# Patient Record
Sex: Male | Born: 1950 | Race: Black or African American | Hispanic: No | Marital: Married | Smoking: Former smoker
Health system: Southern US, Community
[De-identification: ages and names within clinical notes are randomized; demographics above are authoritative.]

## PROBLEM LIST (undated history)

## (undated) DIAGNOSIS — Z95810 Presence of automatic (implantable) cardiac defibrillator: Secondary | ICD-10-CM

## (undated) DIAGNOSIS — K5730 Diverticulosis of large intestine without perforation or abscess without bleeding: Secondary | ICD-10-CM

## (undated) DIAGNOSIS — I10 Essential (primary) hypertension: Secondary | ICD-10-CM

## (undated) DIAGNOSIS — E785 Hyperlipidemia, unspecified: Secondary | ICD-10-CM

## (undated) DIAGNOSIS — K3184 Gastroparesis: Secondary | ICD-10-CM

## (undated) DIAGNOSIS — N183 Chronic kidney disease, stage 3 unspecified: Secondary | ICD-10-CM

## (undated) DIAGNOSIS — K219 Gastro-esophageal reflux disease without esophagitis: Secondary | ICD-10-CM

## (undated) DIAGNOSIS — E119 Type 2 diabetes mellitus without complications: Secondary | ICD-10-CM

## (undated) DIAGNOSIS — M1990 Unspecified osteoarthritis, unspecified site: Secondary | ICD-10-CM

## (undated) DIAGNOSIS — I509 Heart failure, unspecified: Secondary | ICD-10-CM

## (undated) DIAGNOSIS — J449 Chronic obstructive pulmonary disease, unspecified: Secondary | ICD-10-CM

## (undated) DIAGNOSIS — I499 Cardiac arrhythmia, unspecified: Secondary | ICD-10-CM

## (undated) DIAGNOSIS — I4892 Unspecified atrial flutter: Secondary | ICD-10-CM

## (undated) DIAGNOSIS — D126 Benign neoplasm of colon, unspecified: Secondary | ICD-10-CM

## (undated) HISTORY — DX: Chronic obstructive pulmonary disease, unspecified: J44.9

## (undated) HISTORY — PX: TONSILLECTOMY: SUR1361

## (undated) HISTORY — DX: Unspecified osteoarthritis, unspecified site: M19.90

## (undated) HISTORY — DX: Gastro-esophageal reflux disease without esophagitis: K21.9

## (undated) HISTORY — DX: Diverticulosis of large intestine without perforation or abscess without bleeding: K57.30

## (undated) HISTORY — DX: Hyperlipidemia, unspecified: E78.5

## (undated) HISTORY — DX: Gastroparesis: K31.84

## (undated) HISTORY — PX: CATARACT EXTRACTION W/ INTRAOCULAR LENS  IMPLANT, BILATERAL: SHX1307

## (undated) HISTORY — DX: Benign neoplasm of colon, unspecified: D12.6

---

## 2002-12-22 ENCOUNTER — Emergency Department (HOSPITAL_COMMUNITY): Admission: EM | Admit: 2002-12-22 | Discharge: 2002-12-22 | Payer: Self-pay | Admitting: Emergency Medicine

## 2003-06-15 ENCOUNTER — Ambulatory Visit (HOSPITAL_COMMUNITY): Admission: RE | Admit: 2003-06-15 | Discharge: 2003-06-15 | Payer: Self-pay | Admitting: Pulmonary Disease

## 2004-04-29 ENCOUNTER — Inpatient Hospital Stay (HOSPITAL_COMMUNITY): Admission: EM | Admit: 2004-04-29 | Discharge: 2004-04-30 | Payer: Self-pay | Admitting: Emergency Medicine

## 2004-06-18 ENCOUNTER — Ambulatory Visit (HOSPITAL_COMMUNITY): Admission: RE | Admit: 2004-06-18 | Discharge: 2004-06-18 | Payer: Self-pay | Admitting: Nephrology

## 2004-07-26 ENCOUNTER — Emergency Department (HOSPITAL_COMMUNITY): Admission: EM | Admit: 2004-07-26 | Discharge: 2004-07-26 | Payer: Self-pay | Admitting: Emergency Medicine

## 2005-08-21 ENCOUNTER — Emergency Department (HOSPITAL_COMMUNITY): Admission: EM | Admit: 2005-08-21 | Discharge: 2005-08-21 | Payer: Self-pay | Admitting: Emergency Medicine

## 2006-03-22 ENCOUNTER — Emergency Department (HOSPITAL_COMMUNITY): Admission: EM | Admit: 2006-03-22 | Discharge: 2006-03-22 | Payer: Self-pay | Admitting: Emergency Medicine

## 2007-09-11 ENCOUNTER — Emergency Department (HOSPITAL_COMMUNITY): Admission: EM | Admit: 2007-09-11 | Discharge: 2007-09-11 | Payer: Self-pay | Admitting: Emergency Medicine

## 2008-07-13 ENCOUNTER — Emergency Department (HOSPITAL_COMMUNITY): Admission: EM | Admit: 2008-07-13 | Discharge: 2008-07-13 | Payer: Self-pay | Admitting: Emergency Medicine

## 2009-03-22 ENCOUNTER — Emergency Department (HOSPITAL_COMMUNITY): Admission: EM | Admit: 2009-03-22 | Discharge: 2009-03-22 | Payer: Self-pay | Admitting: Emergency Medicine

## 2009-09-07 ENCOUNTER — Ambulatory Visit (HOSPITAL_COMMUNITY): Admission: RE | Admit: 2009-09-07 | Discharge: 2009-09-07 | Payer: Self-pay | Admitting: Ophthalmology

## 2009-10-09 ENCOUNTER — Ambulatory Visit: Payer: Self-pay | Admitting: Gastroenterology

## 2009-10-09 ENCOUNTER — Encounter: Payer: Self-pay | Admitting: Internal Medicine

## 2009-10-09 DIAGNOSIS — K5900 Constipation, unspecified: Secondary | ICD-10-CM | POA: Insufficient documentation

## 2009-10-09 DIAGNOSIS — K219 Gastro-esophageal reflux disease without esophagitis: Secondary | ICD-10-CM | POA: Insufficient documentation

## 2009-10-09 DIAGNOSIS — R1013 Epigastric pain: Secondary | ICD-10-CM | POA: Insufficient documentation

## 2009-10-30 ENCOUNTER — Ambulatory Visit (HOSPITAL_COMMUNITY): Admission: RE | Admit: 2009-10-30 | Discharge: 2009-10-30 | Payer: Self-pay | Admitting: Internal Medicine

## 2009-10-30 ENCOUNTER — Ambulatory Visit: Payer: Self-pay | Admitting: Internal Medicine

## 2009-10-30 DIAGNOSIS — D126 Benign neoplasm of colon, unspecified: Secondary | ICD-10-CM

## 2009-10-30 HISTORY — PX: COLONOSCOPY: SHX174

## 2009-10-30 HISTORY — DX: Benign neoplasm of colon, unspecified: D12.6

## 2009-10-30 HISTORY — PX: ESOPHAGOGASTRODUODENOSCOPY: SHX1529

## 2009-11-01 ENCOUNTER — Encounter: Payer: Self-pay | Admitting: Internal Medicine

## 2009-11-01 ENCOUNTER — Encounter (HOSPITAL_COMMUNITY): Admission: RE | Admit: 2009-11-01 | Discharge: 2009-12-01 | Payer: Self-pay | Admitting: Internal Medicine

## 2009-11-02 ENCOUNTER — Encounter: Payer: Self-pay | Admitting: Internal Medicine

## 2009-11-10 ENCOUNTER — Telehealth (INDEPENDENT_AMBULATORY_CARE_PROVIDER_SITE_OTHER): Payer: Self-pay

## 2009-12-13 ENCOUNTER — Telehealth (INDEPENDENT_AMBULATORY_CARE_PROVIDER_SITE_OTHER): Payer: Self-pay

## 2010-02-27 ENCOUNTER — Telehealth (INDEPENDENT_AMBULATORY_CARE_PROVIDER_SITE_OTHER): Payer: Self-pay

## 2010-04-03 NOTE — Progress Notes (Signed)
Summary: phone note/ need Domperidone Rx faxed to [facility]  Phone Note Other Incoming   Caller: Frank Hoover/ Oncologist of Call: Reyne Dumas called and said he has called [facility] to set up an acct for pt to have the Domperidone. Needs someone from  office to fax Rx to [facility] @ [phone] and include customer ID [phone]. Initial call taken by: Waldon Merl LPN,  October 12, 624THL 9:06 AM     Appended Document: phone note/ need Domperidone Rx faxed to [facility] RMR left written Rx at [facility] short stay. Went to short stay and picked up Rx and faxed to [facility].   Appended Document: phone note/ need Domperidone Rx faxed to [facility] prescription left at short stay as per plan on 12/14/09

## 2010-04-03 NOTE — Letter (Signed)
Summary: Patient Notice, Colon Biopsy Results  [facility]  [address]   [place], [place] [address]   Phone: [phone]  Fax: [phone]       November 01, 2009   Frank Hoover [address] [place], [place]  [address] June 14, 1950    Dear Mr. [name],  I am pleased to inform you that the biopsies taken during your recent colonoscopy did not show any evidence of cancer upon pathologic examination.  Additional information/recommendations:  Continue with the treatment plan as outlined on the day of your exam.  You should have a repeat colonoscopy examination  in 3 years.  Please call us if you are having persistent problems or have questions about your condition that have not been fully answered at this time.  Sincerely,    R. Garfield Cornea MD, [facility] Ph: [phone]    Fax: [phone]   Appended Document: Patient Notice, Colon Biopsy Results letter mailed to pt  Appended Document: Patient Notice, Colon Biopsy Results reminder in computer

## 2010-04-03 NOTE — Progress Notes (Signed)
----   Converted from flag ---- ---- 11/10/2009 12:18 PM, R. Garfield Cornea MD, Quentin Ore wrote: may take domperiedone up to 4x daily (max) before 3 meals and at bedtime or before meals only - up to 4x daily  ---- 11/10/2009 12:02 PM, Waldon Merl LPN wrote: Dr. Gala Romney, is pt just to take Domperidone before one meal and at bedtime daily? Please confirm so i can call in to [facility]. Thx ------------------------------  Appended Document:  LATE ENTRY: Called to [place] on 11/10/2009.

## 2010-04-03 NOTE — Assessment & Plan Note (Signed)
Summary: ABD PAIN/SS   Visit Type:  Consult Referring Provider:  Velvet Bathe Primary Care Provider:  Velvet Bathe  Chief Complaint:  abd pain.  History of Present Illness: Frank Hoover is a pleasant 60 yo AA male, patient of Dr. Luan Pulling, who presents for further evaluation of abd pain. Symptoms began when he ran out of his omeprazole for several days. Less frequent abd pain since back on medications. No heartburn on PPI. On med for several years. On Zegerid first, but Medicare wouldn't cover. No dysphagia. BM slow, every other day. No melena, brpbpr. No prior TCS. No n/v. No significant weight change.  Current Medications (verified): 1)  Allopurinol 300 Mg Tabs (Allopurinol) .... 1/2 Once Daily 2)  Gabapentin 400 Mg Caps (Gabapentin) .... Qid 3)  Colcrys 0.6 Mg Tabs (Colchicine) .... Once Daily 4)  Zolpidem Tartrate 10 Mg Tabs (Zolpidem Tartrate) .... At Bedtime 5)  Clonidine Hcl 0.2 Mg Tabs (Clonidine Hcl) .... Two Times A Day 6)  Furosemide 20 Mg Tabs (Furosemide) .... 2 Once Daily 7)  Proair Hfa 108 (90 Base) Mcg/act Aers (Albuterol Sulfate) .... Qid 8)  Omeprazole 20 Mg Cpdr (Omeprazole) .... Once Daily 9)  Norco 5-325 Mg Tabs (Hydrocodone-Acetaminophen) .... Qid As Needed 10)  Amlodipine Besy-Benazepril Hcl 5-10 Mg Caps (Amlodipine Besy-Benazepril Hcl) .... Once Daily 11)  Simvastatin 40 Mg Tabs (Simvastatin) .... Once Daily 12)  Glimepiride 2 Mg Tabs (Glimepiride) .... Once Daily 13)  Humulin R 100 Unit/ml Soln (Insulin Regular Human) .... Ss 14)  Humulin 70/30 70-30 % Susp (Insulin Isophane & Regular) .... 7 Units Daily  Allergies (verified): No Known Drug Allergies  Past History:  Past Medical History: Gout CRF COPD Diabetes GERD Hyperlipidemia Hypertension OA  Past Surgical History: Tonsillectomy Cataract extraction, right  Family History: Mother, brother, DM, HTN Father, emphysema No FH CRC, liver disease.  Social History: Married. 2 children. Quit tob, 25  years ago. No alcohol now, never heavy.   Review of Systems General:  Denies fever, chills, sweats, anorexia, fatigue, weakness, and weight loss. Eyes:  Denies vision loss. ENT:  Denies nasal congestion, sore throat, hoarseness, and difficulty swallowing. CV:  Complains of peripheral edema; denies chest pains, angina, palpitations, and dyspnea on exertion. Resp:  Denies dyspnea at rest, dyspnea with exercise, cough, sputum, and wheezing. GI:  See HPI. GU:  Denies urinary burning and blood in urine. MS:  Complains of joint pain / LOM. Derm:  Denies rash and itching. Neuro:  Denies weakness, frequent headaches, memory loss, and confusion. Psych:  Denies depression and anxiety. Endo:  Denies unusual weight change. Heme:  Denies bruising and bleeding. Allergy:  Denies hives and rash.  Vital Signs:  Patient profile:   60 year old male Height:      66 inches Weight:      266 pounds BMI:     43.09 Temp:     98.3 degrees F oral Pulse rate:   60 / minute BP sitting:   112 / 78  (left arm) Cuff size:   large  Vitals Entered By: Burnadette Peter LPN (August  8, 624THL 9:36 AM)  Physical Exam  General:  Well developed, well nourished, no acute distress.obese.   Head:  Normocephalic and atraumatic. Eyes:  sclera nonicteric Mouth:  Oropharyngeal mucosa moist, pink.  No lesions, erythema or exudate.   dentures:.   Neck:  Supple; no masses or thyromegaly. Lungs:  Clear throughout to auscultation. Heart:  Regular rate and rhythm; no murmurs, rubs,  or bruits.  Abdomen:  Normal BS. Obese. No HSM or masses appreciated but limited due to body habitus. No fluid wave or shifting dullness to percussion. No abd bruit or hernia.  Rectal:  deferred until time of colonoscopy.   Extremities:  trace pedal edema.   Neurologic:  Alert and  oriented x4;  grossly normal neurologically. Skin:  Intact without significant lesions or rashes. Cervical Nodes:  No significant cervical adenopathy. Psych:  Alert and  cooperative. Normal mood and affect.  Impression & Recommendations:  Problem # 1:  ABDOMINAL PAIN-EPIGASTRIC (ICD-789.06)  Recent epigastric pain off PPI. Symptoms are better. He has chronic GERD, heartburn controlled. EGD to be performed in near future.  Risks, alternatives, benefits including but not limited to risk of reaction to medications, bleeding, infection, and perforation addressed.  Patient voiced understanding and verbal consent obtained. To further evaluate pain and r/o complicated GERD.  Retrieve last labs from Dr. Luan Pulling.  Orders: Consultation Level IV [id number])  Problem # 2:  CONSTIPATION (ICD-564.00)  Start Dulcolax Balance, samples provided. Then will begin generic.   No prior TCS. Colonoscopy to be performed in near future.  Risks, alternatives, and benefits including but not limited to the risk of reaction to medication, bleeding, infection, and perforation were addressed.  Patient voiced understanding and provided verbal consent.   Orders: Consultation Level IV [id number])  Patient Instructions: 1)  Colonoscopy and EGD as scheduled. 2)  Please start Dulcolax Balance one capful daily for next 7 days. Then take prescription given for the generic. 3)  We will obtain your last labs from Dr. Luan Pulling for our review. 4)  The medication list was reviewed and reconciled.  All changed / newly prescribed medications were explained.  A complete medication list was provided to the patient / caregiver. Prescriptions: POLYETHYLENE GLYCOL 3350  POWD (POLYETHYLENE GLYCOL 3350) 17 grams by mouth daily as needed constipation  #527 g x 5   Entered and Authorized by:   Laureen Ochs. Bernarda Caffey   Signed by:   Laureen Ochs Bence Trapp PA-C on 10/09/2009   Method used:   Print then Give to Patient   RxID:   [id number]  I would like to thank Dr. Luan Pulling for allowing [place] to take part in the care of this nice patient.   Appended Document: ABD PAIN/SS Labs from 6/11: HgbA1C 6.4

## 2010-04-03 NOTE — Letter (Signed)
Summary: Patient Notice, Colon Biopsy Results  [facility]  [address]   [place], [place] [address]   Phone: [phone]  Fax: [phone]       November 02, 2009   Frank Hoover [address] [place], [place]  [address] [birth year]    Dear [name],  I am pleased to inform you that the biopsies taken during your recent colonoscopy did not show any evidence of cancer upon pathologic examination.  Additional information/recommendations:  You should have a repeat colonoscopy examination  in 3 years.  Please call us if you are having persistent problems or have questions about your condition that have not been fully answered at this time.  Sincerely,    R. Garfield Cornea MD, [facility] Ph: [phone]    Fax: [phone]   Appended Document: Patient Notice, Colon Biopsy Results duplicate letter. First letter already mailed

## 2010-04-03 NOTE — Letter (Signed)
Summary: TCS/EGD Order   TCS/EGD Order   Imported By: Sherry Ruffing 10/09/2009 11:00:37  _____________________________________________________________________  External Attachment:    Type:   Image     Comment:   External Document

## 2010-04-05 NOTE — Progress Notes (Signed)
Summary: phone note/ ref to Tylenol for as needed pain  Phone Note Other Incoming   Caller: Arnette Red/ Nurse  Summary of Call: Anne Ng called and said she has been getting Tylenol for as needed pain. (Pt takes Norco 5/325/ mg). But  the pharmacist said now she should check with the doctor before she does this. Please advise! ( Her call back number is [phone]. Initial call taken by: Waldon Merl LPN,  December 27, 624THL 4:22 PM     Appended Document: phone note/ ref to Tylenol for as needed pain up to 2 grams of acetaminophen daily (from all sources) [place], even in cirrhotics; if  2 grams not adequate, then need to see pcp for other Rx options   Appended Document: phone note/ ref to Tylenol for as needed pain Informed nurse, Arnette and also Jenny Reichmann, Software engineer at [employer].

## 2010-05-15 ENCOUNTER — Encounter: Payer: Self-pay | Admitting: Internal Medicine

## 2010-05-17 LAB — GLUCOSE, CAPILLARY: Glucose-Capillary: 192 mg/dL — ABNORMAL HIGH (ref 70–99)

## 2010-05-20 LAB — CBC
HCT: 35.4 % — ABNORMAL LOW (ref 39.0–52.0)
Hemoglobin: 12.4 g/dL — ABNORMAL LOW (ref 13.0–17.0)
MCHC: 35.2 g/dL (ref 30.0–36.0)
MCV: 89.7 fL (ref 78.0–100.0)
Platelets: 243 10*3/uL (ref 150–400)
RBC: 3.94 MIL/uL — ABNORMAL LOW (ref 4.22–5.81)
RDW: 14.9 % (ref 11.5–15.5)
WBC: 6 10*3/uL (ref 4.0–10.5)

## 2010-05-20 LAB — BASIC METABOLIC PANEL
BUN: 58 mg/dL — ABNORMAL HIGH (ref 6–23)
CO2: 25 mEq/L (ref 19–32)
Calcium: 9.1 mg/dL (ref 8.4–10.5)
Chloride: 96 mEq/L (ref 96–112)
Creatinine, Ser: 2.43 mg/dL — ABNORMAL HIGH (ref 0.4–1.5)
GFR calc Af Amer: 33 mL/min — ABNORMAL LOW (ref >60)
GFR calc non Af Amer: 28 mL/min — ABNORMAL LOW (ref >60)
Glucose, Bld: 225 mg/dL — ABNORMAL HIGH (ref 70–99)
Potassium: 3.9 mEq/L (ref 3.5–5.1)
Sodium: 132 mEq/L — ABNORMAL LOW (ref 135–145)

## 2010-05-20 LAB — COMPREHENSIVE METABOLIC PANEL
ALT: 19 U/L (ref 0–53)
AST: 18 U/L (ref 0–37)
Albumin: 3.9 g/dL (ref 3.5–5.2)
Alkaline Phosphatase: 49 U/L (ref 39–117)
BUN: 18 mg/dL (ref 6–23)
CO2: 27 mEq/L (ref 19–32)
Calcium: 9.6 mg/dL (ref 8.4–10.5)
Chloride: 108 mEq/L (ref 96–112)
Creatinine, Ser: 1.44 mg/dL (ref 0.4–1.5)
GFR calc Af Amer: >60 mL/min (ref >60)
GFR calc non Af Amer: 51 mL/min — ABNORMAL LOW (ref >60)
Glucose, Bld: 59 mg/dL — ABNORMAL LOW (ref 70–99)
Potassium: 4.8 mEq/L (ref 3.5–5.1)
Sodium: 140 mEq/L (ref 135–145)
Total Bilirubin: 0.5 mg/dL (ref 0.3–1.2)
Total Protein: 6.9 g/dL (ref 6.0–8.3)

## 2010-05-20 LAB — GLUCOSE, CAPILLARY: Glucose-Capillary: 171 mg/dL — ABNORMAL HIGH (ref 70–99)

## 2010-05-20 LAB — HEMOGLOBIN AND HEMATOCRIT, BLOOD
HCT: 34.8 % — ABNORMAL LOW (ref 39.0–52.0)
Hemoglobin: 12.4 g/dL — ABNORMAL LOW (ref 13.0–17.0)

## 2010-05-20 LAB — DIFFERENTIAL
Basophils Absolute: 0 10*3/uL (ref 0.0–0.1)
Basophils Relative: 0 % (ref 0–1)
Eosinophils Absolute: 0.2 10*3/uL (ref 0.0–0.7)
Eosinophils Relative: 3 % (ref 0–5)
Lymphocytes Relative: 30 % (ref 12–46)
Lymphs Abs: 1.8 10*3/uL (ref 0.7–4.0)
Monocytes Absolute: 0.4 10*3/uL (ref 0.1–1.0)
Monocytes Relative: 7 % (ref 3–12)
Neutro Abs: 3.6 10*3/uL (ref 1.7–7.7)
Neutrophils Relative %: 60 % (ref 43–77)

## 2010-05-20 LAB — LIPASE, BLOOD: Lipase: 18 U/L (ref 11–59)

## 2010-05-20 LAB — POCT CARDIAC MARKERS
CKMB, poc: 1.9 ng/mL (ref 1.0–8.0)
Myoglobin, poc: 154 ng/mL (ref 12–200)
Troponin i, poc: <0.05 ng/mL (ref 0.00–0.09)

## 2010-05-22 NOTE — Medication Information (Signed)
Summary: DONPERIDON   DONPERIDON   Imported By: Hoy Morn 05/15/2010 14:22:47  _____________________________________________________________________  External Attachment:    Type:   Image     Comment:   External Document

## 2010-06-12 LAB — URINALYSIS, ROUTINE W REFLEX MICROSCOPIC
Bilirubin Urine: NEGATIVE
Glucose, UA: NEGATIVE mg/dL
Ketones, ur: NEGATIVE mg/dL
Leukocytes, UA: NEGATIVE
Nitrite: NEGATIVE
Protein, ur: 100 mg/dL — AB
Specific Gravity, Urine: 1.01 (ref 1.005–1.030)
Urobilinogen, UA: 0.2 mg/dL (ref 0.0–1.0)
pH: 6 (ref 5.0–8.0)

## 2010-06-12 LAB — URINE MICROSCOPIC-ADD ON

## 2010-07-20 NOTE — Discharge Summary (Signed)
NAME:  Frank Hoover, Frank Hoover NO.:  [id number]   MEDICAL RECORD NO.:  [id number]          PATIENT TYPE:  INP   LOCATION:  A315                          FACILITY:  [facility]   PHYSICIAN:  Edward L. Luan Pulling, M.D.DATE OF BIRTH:  January 04, 1951   DATE OF ADMISSION:  04/29/2004  DATE OF DISCHARGE:  02/27/2006LH                                 DISCHARGE SUMMARY   FINAL DISCHARGE DIAGNOSES:  1.  Diabetes with hypoglycemia.  2.  Hypertension.  3.  Acute renal failure.  4.  Osteoarthritis.  5.  Hyperlipidemia.  6.  Hypokalemia.  7.  Hyponatremia.   HISTORY:  Frank Hoover is a 60 year old with long known history of severe  hypertension and insulin-dependent diabetes.  He had been in his usual state  of fair health at home when he woke up with EMS with him, he was found to  have low blood sugar, he received treatment and has improved.  When he was  seen in the emergency room however, he had what appeared to be renal failure  which is a new finding.   PHYSICAL EXAMINATION:  His physical exam showed blood pressure is 145/83,  pulse 81, respirations 16, temperature 97.6.  His pupils are reactive, nose  and throat clear, CNS is grossly intact after he got his sugar up.  His  potassium was 3.3, sodium 130.  His BUN was 72, creatinine 2.9.   HOSPITAL COURSE:  He was admitted with a diagnosis of hypoglycemia and renal  failure.  He has not had renal failure in the past.  He was started on  intravenous fluids, indomethacin which he had been taking intermittently for  gout was withheld, he had a renal ultrasound which was normal and his renal  function returned much improved with BUN of 43, creatinine of 1.9 by the  time of discharge.  He is discharged home in improved condition to continue  with his previous medications, to be followed closely and come to my office  in about a week for another BMET.      ELH/MEDQ  D:  04/30/2004  T:  04/30/2004  Job:  [id number]

## 2010-07-20 NOTE — Procedures (Signed)
NAME:  Frank Hoover, Frank Hoover NO.:  [id number]   MEDICAL RECORD NO.:  [id number]          PATIENT TYPE:  EMS   LOCATION:  ED                            FACILITY:  [facility]   PHYSICIAN:  Edward L. Luan Pulling, M.D.DATE OF BIRTH:  1950/05/15   DATE OF PROCEDURE:  08/21/2005  DATE OF DISCHARGE:  08/21/2005                                EKG INTERPRETATION   The rhythm is sinus tachycardia with a rate of about 130.  The computers  called it a PAC but I think it is artifactual.  There is questionable left  atrial enlargement.  Slow R-wave progression across the precordium may  indicate a previous anterior infarction.  There are T-wave abnormalities  which are nonspecific.      Edward L. Luan Pulling, M.D.  Electronically Signed     ELH/MEDQ  D:  08/21/2005  T:  08/21/2005  Job:  [id number]

## 2010-07-20 NOTE — H&P (Signed)
NAME:  Frank Hoover, Frank Hoover NO.:  [id number]   MEDICAL RECORD NO.:  [id number]          PATIENT TYPE:  INP   LOCATION:  A315                          FACILITY:  [facility]   PHYSICIAN:  Edward L. Luan Pulling, M.D.DATE OF BIRTH:  [birth year]   DATE OF ADMISSION:  04/29/2004  DATE OF DISCHARGE:  LH                                HISTORY & PHYSICAL   REASON FOR ADMISSION:  Hypoglycemia.   HISTORY:  Mr. Hocevar is a 60 year old with a long history of severe  hypertension and insulin-dependent diabetes.  He was in his usual state of  pretty fair health at home when he says that he woke up with EMS with him,  and he was found to have a low blood sugar.  He says his blood sugar was 60,  but I believe that was in the emergency room after receiving treatment on  the way to the hospital.  He has not had any nausea, vomiting, diarrhea.  He  has had a cold, but he has not had any other problems that might have caused  him to have difficulty with his blood sugar.  He was given D50W on the  scene.   PAST MEDICAL HISTORY:  1.  Osteoarthritis.  2.  Insulin-dependent diabetes mellitus.  3.  Hypertension.  4.  Hyperlipidemia.   SOCIAL HISTORY:  He lives at home with family.  He does not use drugs,  alcohol, or tobacco.   FAMILY HISTORY:  Very positive for diabetes in multiple family members, and  there is a history of coronary disease as well.   MEDICATIONS AT HOME:  1.  Lotrel 10/20 daily.  2.  Benicar 40 mg daily.  3.  Indomethacin 50 mg four times a day as needed.  4.  Amaryl 2 mg daily.  5.  Lasix 20 mg daily p.r.n.  6.  HCTZ 25 mg daily.  7.  Lipitor 40 mg daily.  8.  Nadolol 40 mg b.i.d.  9.  Temazepam 15 mg for sleep.  10. He uses 70/30 insulin, the dose of that is not quite clear.   PHYSICAL EXAMINATION ON ADMISSION:  GENERAL:  Awake and alert, complaining  of being cold.  VITAL SIGNS:  Blood pressure 145/83, pulse 81, respirations 16, temperature  97.6.  HEENT:  Pupils  equal, round, and reactive to light and accommodation.  Mucous membranes slightly dry.  He has multiple missing teeth.  His nose and  throat are clear.  CHEST:  Clear without wheezes, rales, or rhonchi.  HEART:  Regular without murmur, gallop, or rub.  ABDOMEN:  Soft.  No masses felt.  EXTREMITIES:  No edema.  CNS:  Grossly intact.   LABORATORY DATA AND OTHER STUDIES:  Liver function normal.  BMET showed BUN  72, creatinine 2.9, potassium 3.3, sodium 130.  CBC shows white count 7700,  hemoglobin 12.4.   ASSESSMENT:  He has hypoglycemia.  He had also elevated BUN and creatinine,  suggestive at least that he might have had an increase in renal dysfunction.  Some of this may be related to his use of indomethacin which he  uses p.r.n.  for gout.  He is going to have an ultrasound of the kidneys.  We will  continue his other medications and follow.      ELH/MEDQ  D:  04/29/2004  T:  04/29/2004  Job:  [id number]

## 2010-07-20 NOTE — Group Therapy Note (Signed)
NAME:  KHALFANI, WEIDEMAN NO.:  [id number]   MEDICAL RECORD NO.:  [id number]          PATIENT TYPE:  INP   LOCATION:  A315                          FACILITY:  [facility]   PHYSICIAN:  Edward L. Luan Pulling, M.D.DATE OF BIRTH:  June 19, 1950   DATE OF PROCEDURE:  04/30/2004  DATE OF DISCHARGE:                                   PROGRESS NOTE   SUBJECTIVE:  Mr. Hinnant was admitted with hypoglycemia.  He is much better.  He also was noted to have an abnormal renal function.  His blood sugars have  been much higher now so he has no further episodes of hypoglycemia.   PHYSICAL EXAMINATION:  VITAL SIGNS:  Blood pressure 111/58, temperature  97.8, pulse 69, respirations 20, blood sugar 176.   ASSESSMENT:  He is down for renal ultrasound.  His renal function is much  improved today so I am hopeful that he will be able to be discharged later  today.      ELH/MEDQ  D:  04/30/2004  T:  04/30/2004  Job:  [id number]

## 2010-09-21 ENCOUNTER — Ambulatory Visit (INDEPENDENT_AMBULATORY_CARE_PROVIDER_SITE_OTHER): Payer: PRIVATE HEALTH INSURANCE | Admitting: Urgent Care

## 2010-09-21 ENCOUNTER — Telehealth: Payer: Self-pay

## 2010-09-21 ENCOUNTER — Encounter: Payer: Self-pay | Admitting: Urgent Care

## 2010-09-21 DIAGNOSIS — K219 Gastro-esophageal reflux disease without esophagitis: Secondary | ICD-10-CM

## 2010-09-21 DIAGNOSIS — Z860101 Personal history of adenomatous and serrated colon polyps: Secondary | ICD-10-CM

## 2010-09-21 DIAGNOSIS — Z86010 Personal history of colonic polyps: Secondary | ICD-10-CM | POA: Insufficient documentation

## 2010-09-21 DIAGNOSIS — K3184 Gastroparesis: Secondary | ICD-10-CM | POA: Insufficient documentation

## 2010-09-21 DIAGNOSIS — E669 Obesity, unspecified: Secondary | ICD-10-CM

## 2010-09-21 DIAGNOSIS — R1013 Epigastric pain: Secondary | ICD-10-CM

## 2010-09-21 DIAGNOSIS — K5900 Constipation, unspecified: Secondary | ICD-10-CM

## 2010-09-21 NOTE — Assessment & Plan Note (Signed)
Well controlled on prilosec 20mg  daily.

## 2010-09-21 NOTE — Assessment & Plan Note (Signed)
Increase domperidone 10mg  to QID as originally ordered by Dr Gala Romney.

## 2010-09-21 NOTE — Assessment & Plan Note (Signed)
Low fat low cholesterol diet Wt loss 1-2# per week Gradually increase exercise up to 30 minutes daily

## 2010-09-21 NOTE — Telephone Encounter (Signed)
Pt was in office today and his bottle of Domperidone said one tablet before a meal and at bedtime. Per Vickey Huger, NP I called and spoke to [place] at [facility]. His note said bid, but according to our documentaion in epic on 11/13/2010, it was confirmed up to 4 tablets daily. Not over 4 max. So I informed Lanny Hurst that he is to be taking one before meals and at bedtime. He made notation, and said he will fax over request for an updated prescription.

## 2010-09-21 NOTE — Assessment & Plan Note (Signed)
At baseline.  Miralax prn.

## 2010-09-21 NOTE — Progress Notes (Signed)
Referring Provider: Alonza Bogus, MD Primary Care Physician:  Alonza Bogus, MD Primary Gastroenterologist:  Dr. Gala Romney  Chief Complaint  Patient presents with  . Abdominal Pain    HPI:  Frank Hoover is a 60 y.o. male here for follow up for abd pain, gastroparesis & constipation.  He is doing well.  Is taking domperidone 10mg  bid instead of QID as directed.  Denies nausea, vomiting or abd pain.  Has had some constipation but generally has a BM couple times per week.  Denies rectal bleeding or melena.  Past Medical History  Diagnosis Date  . Gout   . CRF (chronic renal failure)   . COPD (chronic obstructive pulmonary disease)   . DM (diabetes mellitus)   . GERD (gastroesophageal reflux disease)   . HTN (hypertension)   . Hyperlipemia   . OA (osteoarthritis)   . Gastroparesis 11/01/09  . Adenomatous colon polyp 10/30/09    serrated adenoma removed during Colonoscopy   . Diverticulosis of colon    Past Surgical History  Procedure Date  . Tonsillectomy   . Cataract extraction     right   Current Outpatient Prescriptions  Medication Sig Dispense Refill  . albuterol (PROAIR HFA) 108 (90 BASE) MCG/ACT inhaler Inhale 2 puffs into the lungs every 6 (six) hours as needed.        Marland Kitchen allopurinol (ZYLOPRIM) 300 MG tablet Take 300 mg by mouth daily.        Marland Kitchen amLODipine-benazepril (LOTREL) 5-10 MG per capsule Take 1 capsule by mouth daily.        . cloNIDine (CATAPRES) 0.2 MG tablet Take 0.2 mg by mouth 2 (two) times daily.        . colchicine 0.6 MG tablet Take 0.6 mg by mouth daily.        . furosemide (LASIX) 20 MG tablet Take 20 mg by mouth 2 (two) times daily.        Marland Kitchen gabapentin (NEURONTIN) 400 MG capsule Take 400 mg by mouth 3 (three) times daily.        Marland Kitchen glimepiride (AMARYL) 2 MG tablet Take 2 mg by mouth daily before breakfast.        . HYDROcodone-acetaminophen (NORCO) 5-325 MG per tablet Take 1 tablet by mouth every 6 (six) hours as needed.        . insulin NPH-insulin  regular (NOVOLIN 70/30) (70-30) 100 UNIT/ML injection Inject 7 Units into the skin.        Marland Kitchen insulin regular (HUMULIN R,NOVOLIN R) 100 units/mL injection Inject into the skin 3 (three) times daily before meals.        Marland Kitchen omeprazole (PRILOSEC) 20 MG capsule Take 20 mg by mouth daily.        . polyethylene glycol (MIRALAX / GLYCOLAX) packet Take 17 g by mouth daily.        . simvastatin (ZOCOR) 40 MG tablet Take 40 mg by mouth at bedtime.        Marland Kitchen UNABLE TO FIND Domperidone 10 mg Bid       . zolpidem (AMBIEN) 10 MG tablet Take 10 mg by mouth at bedtime as needed.         Allergies as of 09/21/2010  . (No Known Allergies)   Review of Systems: Gen: Denies any fever, chills, sweats, anorexia, fatigue, weakness, malaise, weight loss, and sleep disorder CV: Denies chest pain, angina, palpitations, syncope, orthopnea, PND, peripheral edema, and claudication. Resp: Denies dyspnea at rest, dyspnea with exercise, cough, sputum,  wheezing, coughing up blood, and pleurisy. GI: Denies vomiting blood, jaundice, and fecal incontinence.   Denies dysphagia or odynophagia. Derm: Denies rash, itching, dry skin, hives, moles, warts, or unhealing ulcers.  Psych: Denies depression, anxiety, memory loss, suicidal ideation, hallucinations, paranoia, and confusion. Heme: Denies bruising, bleeding, and enlarged lymph nodes.  Physical Exam: BP 137/82  Pulse 81  Temp(Src) 98 F (36.7 C) (Temporal)  Ht 5\' 6"  (1.676 m)  Wt 267 lb (121.11 kg)  BMI 43.09 kg/m2 General:   Alert,  Well-developed, obese, pleasant and cooperative in NAD Head:  Normocephalic and atraumatic. Eyes:  Sclera clear, no icterus.   Conjunctiva pink. Mouth:  No deformity or lesions, dentition normal. Neck:  Supple; no masses or thyromegaly. Heart:  Regular rate and rhythm; no murmurs, clicks, rubs,  or gallops. Abdomen:  Soft, obese, nontender and nondistended. No masses, hepatosplenomegaly or hernias noted. Normal bowel sounds, without guarding,  and without rebound.  Exam limited given pt's body habitus. Msk:  Symmetrical without gross deformities. Normal posture. Pulses:  Normal pulses noted. Extremities:  Without clubbing or edema. Neurologic:  Alert and  oriented x4;  grossly normal neurologically. Skin:  Intact without significant lesions or rashes. Cervical Nodes:  No significant cervical adenopathy. Psych:  Alert and cooperative. Normal mood and affect.

## 2010-09-21 NOTE — Patient Instructions (Addendum)
Domperidone 10mg  before breakfast, lunch, dinner & at bedtime  Constipation in Adults Constipation is having fewer than 2 bowel movements per week. Usually, the stools are hard. As we grow older, constipation is more common. If you try to fix constipation with laxatives, the problem may get worse. This is because laxatives taken over a long period of time make the colon muscles weaker. A low-fiber diet, not taking in enough fluids, and taking some medicines may make these problems worse. MEDICATIONS THAT MAY CAUSE CONSTIPATION  Water pills (diuretics).  Calcium channel blockers (used to control blood pressure and for the heart).   Certain pain medicines (narcotics).   Anticholinergics.  Anti-inflammatory agents.   Antacids that contain aluminum.   DISEASES THAT CONTRIBUTE TO CONSTIPATION  Diabetes.  Parkinson's disease.   Dementia.   Stroke.  Depression.   Illnesses that cause problems with salt and water metabolism.   HOME CARE INSTRUCTIONS  Constipation is usually best cared for without medicines. Increasing dietary fiber and eating more fruits and vegetables is the best way to manage constipation.   Slowly increase fiber intake to 25 to 38 grams per day. Whole grains, fruits, vegetables, and legumes are good sources of fiber. A dietitian can further help you incorporate high-fiber foods into your diet.   Drink enough water and fluids to keep your urine clear or pale yellow.   A fiber supplement may be added to your diet if you cannot get enough fiber from foods.   Increasing your activities also helps improve regularity.   Suppositories, as suggested by your caregiver, will also help. If you are using antacids, such as aluminum or calcium containing products, it will be helpful to switch to products containing magnesium if your caregiver says it is okay.   If you have been given a liquid injection (enema) today, this is only a temporary measure. It should not be relied on  for treatment of longstanding (chronic) constipation.   Stronger measures, such as magnesium sulfate, should be avoided if possible. This may cause uncontrollable diarrhea. Using magnesium sulfate may not allow you time to make it to the bathroom.  SEEK IMMEDIATE MEDICAL CARE IF:  There is bright red blood in the stool.   The constipation stays for more than 4 days.   There is belly (abdominal) or rectal pain.   You do not seem to be getting better.   You have any questions or concerns.  MAKE SURE YOU:  Understand these instructions.   Will watch your condition.   Will get help right away if you are not doing well or get worse.  Document Released: 11/17/2003 Document Re-Released: 05/15/2009 [facility] Patient Information 2011 [place].Low-Fat, Low-Saturated-Fat, Low-Cholesterol Diets Food Selection Guide BREADS, CEREALS, PASTA, RICE, DRIED PEAS, AND BEANS These products are high in carbohydrates and most are low in fat. Therefore, they can be increased in the diet as substitutes for fatty foods. They too, however, contain calories and should not be eaten in excess. Cereals can be eaten for snacks as well as for breakfast.  Include foods that contain fiber (fruits, vegetables, whole grains, and legumes). Research shows that fiber may lower blood cholesterol levels, especially the water-soluble fiber found in fruits, vegetables, oat products, and legumes. FRUITS AND VEGETABLES It is good to eat fruits and vegetables. Besides being sources of fiber, both are rich in vitamins and some minerals. They help you get the daily allowances of these nutrients. Fruits and vegetables can be used for snacks and desserts. MEATS  Limit lean meat, chicken, [place], and fish to no more than 6 ounces per day. Beef, Pork, and Lamb  Use lean cuts of beef, pork, and lamb. Lean cuts include:   Extra-lean ground beef.   Arm roast.   Sirloin tip.   Center-cut ham.   Round steak.   Loin chops.     Rump roast.   Tenderloin.  Trim all fat off the outside of meats before cooking. It is not necessary to severely decrease the intake of red meat, but lean choices should be made. Lean meat is rich in protein and contains a highly absorbable form of iron. Premenopausal women, in particular, should avoid reducing lean red meat because this could increase the risk for low red blood cells (iron-deficiency anemia). Processed Meats Processed meats, such as bacon, bologna, salami, sausage, and hot dogs contain large quantities of fat, are not rich in valuable nutrients, and should not be eaten very often. Organ Meats The organ meats, such as liver, sweetbreads, kidneys, and brain are very rich in cholesterol. They should be limited. Chicken and [place] These are good sources of protein. The fat of poultry can be reduced by removing the skin and underlying fat layers before cooking. Chicken and [place] can be substituted for lean red meat in the diet. Poultry should not be fried or covered with high-fat sauces. Fish and Shellfish Fish is a good source of protein. Shellfish contain cholesterol, but they usually are low in saturated fatty acids. The preparation of fish is important. Like chicken and [place], they should not be fried or covered with high-fat sauces. EGGS Egg yolks often are hidden in cooked and processed foods. Egg whites contain no fat or cholesterol. They can be eaten often. Try 1 to 2 egg whites instead of whole eggs in recipes or use egg substitutes that do not contain yolk. MILK AND DAIRY PRODUCTS Use skim or 1% milk instead of 2% or whole milk. Decrease whole milk, natural, and processed cheeses. Use nonfat or low-fat (2%) cottage cheese or low-fat cheeses made from vegetable oils. Choose nonfat or low-fat (1 to 2%) yogurt. Experiment with evaporated skim milk in recipes that call for heavy cream. Substitute low-fat yogurt or low-fat cottage cheese for sour cream in dips and salad  dressings. Have at least 2 servings of low-fat dairy products, such as 2 glasses of skim (or 1%) milk each day to help get your daily calcium intake. FATS AND OILS Reduce the total intake of fats, especially saturated fat. Butterfat, lard, and beef fats are high in saturated fat and cholesterol. These should be avoided as much as possible. Vegetable fats do not contain cholesterol, but certain vegetable fats, such as coconut oil, palm oil, and palm kernel oil are very high in saturated fats. These should be limited. These fats are often used in bakery goods, processed foods, popcorn, oils, and nondairy creamers. Vegetable shortenings and some peanut butters contain hydrogenated oils, which are also saturated fats. Read the labels on these foods and check for saturated vegetable oils. Unsaturated vegetable oils and fats do not raise blood cholesterol. However, they should be limited because they are fats and are high in calories. Total fat should still be limited to 30% of your daily caloric intake. Desirable liquid vegetable oils are corn oil, cottonseed oil, olive oil, canola oil, safflower oil, soybean oil, and sunflower oil. Peanut oil is not as good, but small amounts are acceptable. Buy a heart-healthy tub margarine that has no partially hydrogenated oils in  the ingredients. Mayonnaise and salad dressings often are made from unsaturated fats, but they should also be limited because of their high calorie and fat content. Seeds, nuts, peanut butter, olives, and avocados are high in fat, but the fat is mainly the unsaturated type. These foods should be limited mainly to avoid excess calories and fat. OTHER EATING TIPS Snacks  Most sweets should be limited as snacks. They tend to be rich in calories and fats, and their caloric content outweighs their nutritional value. Some good choices in snacks are graham crackers, melba toast, soda crackers, bagels (no egg), English muffins, fruits, and vegetables. These  snacks are preferable to snack crackers, [place] fries, and chips. Popcorn should be air-popped or cooked in small amounts of liquid vegetable oil. Desserts Eat fruit, low-fat yogurt, and fruit ices instead of pastries, cake, and cookies. Sherbet, angel food cake, gelatin dessert, frozen low-fat yogurt, or other frozen products that do not contain saturated fat (pure fruit juice bars, frozen ice pops) are also acceptable.  COOKING METHODS Choose those methods that use little or no fat. They include:  Poaching.   Braising.   Steaming.   Grilling.   Baking.   Stir-frying.   Broiling.   Microwaving.  Foods can be cooked in a nonstick pan without added fat, or use a nonfat cooking spray in regular cookware. Limit fried foods and avoid frying in saturated fat. Add moisture to lean meats by using water, broth, cooking wines, and other nonfat or low-fat sauces along with the cooking methods mentioned above. Soups and stews should be chilled after cooking. The fat that forms on top after a few hours in the refrigerator should be skimmed off. When preparing meals, avoid using excess salt. Salt can contribute to raising blood pressure in some people. EATING AWAY FROM HOME Order entres, potatoes, and vegetables without sauces or butter. When meat exceeds the size of a deck of cards (3 to 4 ounces), the rest can be taken home for another meal. Choose vegetable or fruit salads and ask for low-calorie salad dressings to be served on the side. Use dressings sparingly. Limit high-fat toppings, such as bacon, crumbled eggs, cheese, sunflower seeds, and olives. Ask for heart-healthy tub margarine instead of butter. Document Released: 08/10/2001 Document Re-Released: 05/15/2009 [facility] Patient Information 2011 [place].

## 2010-09-21 NOTE — Assessment & Plan Note (Signed)
Resolved

## 2010-09-24 NOTE — Progress Notes (Signed)
Cc to PCP 

## 2010-09-25 ENCOUNTER — Telehealth: Payer: Self-pay

## 2010-09-25 NOTE — Telephone Encounter (Signed)
See note from 7/20. Supposed to take before meals and at bedtime (total of 4 at most per day). Needs handwritten rx for this likely.

## 2010-09-25 NOTE — Telephone Encounter (Signed)
Patinet is on Domperidone 10 mg Bid but the caregiver has asked if we can increase it to Qid. Patient goes to [facility] in [place].

## 2010-12-06 ENCOUNTER — Ambulatory Visit (INDEPENDENT_AMBULATORY_CARE_PROVIDER_SITE_OTHER): Payer: PRIVATE HEALTH INSURANCE | Admitting: Gastroenterology

## 2010-12-06 ENCOUNTER — Encounter: Payer: Self-pay | Admitting: Gastroenterology

## 2010-12-06 DIAGNOSIS — R1310 Dysphagia, unspecified: Secondary | ICD-10-CM | POA: Insufficient documentation

## 2010-12-06 DIAGNOSIS — K5900 Constipation, unspecified: Secondary | ICD-10-CM

## 2010-12-06 DIAGNOSIS — K3184 Gastroparesis: Secondary | ICD-10-CM

## 2010-12-06 NOTE — Patient Instructions (Signed)
Increase Miralax to twice a day, ONLY three days a week. The other days take it once a day.  Follow a high fiber diet. Please see handout.   Start taking a probiotic. Samples provided.  If you have any difficulties swallowing, contact our office.  Otherwise, we will see you back in 3 months.

## 2010-12-06 NOTE — Assessment & Plan Note (Signed)
60 year old male referred for dysphagia, yet this has completely resolved. He reports having a "cold" around time of symptoms. States X 2 weeks. No reflux or odynophagia. No other warning signs. Discussed in detail signs and symptoms to report. Should his symptoms return, we would not hesitate to proceed with EGD/ED. For now, we will hold off and see how he does. He was instructed to contact us if any reoccurrence.

## 2010-12-06 NOTE — Assessment & Plan Note (Signed)
Stable

## 2010-12-06 NOTE — Progress Notes (Signed)
Referring Provider: Alonza Bogus, MD Primary Care Physician:  Alonza Bogus, MD Primary Gastroenterologist: Dr. Gala Romney   Chief Complaint  Patient presents with  . Dysphagia    HPI:   Pt returns today in f/u, actually asked to see [place] again by Dr. Luan Pulling. Last seen July 2012. History significant for abdominal pain, gastroparesis, and constipation. He is seeing [place] today due to dysphagia. About 2 weeks ago was experiencing solid food dysphagia; he states he went to Dr. Luan Pulling, whereby he received a flu shot. Reports having a cold around that time. States later that evening, dysphagia completely resolved. He has had none since. Denies odynophagia or abdominal pain. Denies reflux. Only complaint today is of constipation. Takes Miralax but still hard stools. Small amounts. Usually every 2-3 days. No melena or hematochezia noted. Not eating high fiber diet. Denies N/V. Drinking water. Not on probiotic.   Past Medical History  Diagnosis Date  . Gout   . CRF (chronic renal failure)   . COPD (chronic obstructive pulmonary disease)   . DM (diabetes mellitus)   . GERD (gastroesophageal reflux disease)   . HTN (hypertension)   . Hyperlipemia   . OA (osteoarthritis)   . Gastroparesis 11/01/09  . Adenomatous colon polyp 10/30/09    serrated adenoma removed during Colonoscopy   . Diverticulosis of colon     Past Surgical History  Procedure Date  . Tonsillectomy   . Cataract extraction     right    Current Outpatient Prescriptions  Medication Sig Dispense Refill  . albuterol (PROAIR HFA) 108 (90 BASE) MCG/ACT inhaler Inhale 2 puffs into the lungs every 6 (six) hours as needed.        Marland Kitchen allopurinol (ZYLOPRIM) 300 MG tablet Take 300 mg by mouth daily.        Marland Kitchen amLODipine-benazepril (LOTREL) 5-10 MG per capsule Take 1 capsule by mouth daily.        . cloNIDine (CATAPRES) 0.2 MG tablet Take 0.2 mg by mouth 2 (two) times daily.        . colchicine 0.6 MG tablet Take 0.6 mg by mouth  daily.        . furosemide (LASIX) 20 MG tablet Take 20 mg by mouth 2 (two) times daily.        Marland Kitchen gabapentin (NEURONTIN) 400 MG capsule Take 400 mg by mouth 3 (three) times daily.        Marland Kitchen glimepiride (AMARYL) 2 MG tablet Take 2 mg by mouth daily before breakfast.        . HYDROcodone-acetaminophen (NORCO) 5-325 MG per tablet Take 1 tablet by mouth every 6 (six) hours as needed.        . insulin NPH-insulin regular (NOVOLIN 70/30) (70-30) 100 UNIT/ML injection Inject 7 Units into the skin.        Marland Kitchen insulin regular (HUMULIN R,NOVOLIN R) 100 units/mL injection Inject into the skin 3 (three) times daily before meals.        Marland Kitchen omeprazole (PRILOSEC) 20 MG capsule Take 20 mg by mouth daily.        . polyethylene glycol (MIRALAX / GLYCOLAX) packet Take 17 g by mouth daily.        . simvastatin (ZOCOR) 40 MG tablet Take 40 mg by mouth at bedtime.        Marland Kitchen UNABLE TO FIND Domperidone 10 mg Bid       . zolpidem (AMBIEN) 10 MG tablet Take 10 mg by mouth at bedtime as needed.  Allergies as of 12/06/2010  . (No Known Allergies)    Family History  Problem Relation Age of Onset  . Diabetes Brother     mother  . COPD Father   . Hypertension Mother     History   Social History  . Marital Status: Married    Spouse Name: N/A    Number of Children: 2  . Years of Education: N/A   Occupational History  . retired    Social History Main Topics  . Smoking status: Former Smoker    Quit date: 08/26/2010  . Smokeless tobacco: None  . Alcohol Use: No  . Drug Use: No  . Sexually Active: None   Other Topics Concern  . None   Social History Narrative  . None    Review of Systems: Gen: Denies fever, chills, anorexia. Denies fatigue, weakness, weight loss.  CV: Denies chest pain, palpitations, syncope, peripheral edema, and claudication. Resp: Denies dyspnea at rest, cough, wheezing, coughing up blood, and pleurisy. GI: Denies vomiting blood, jaundice, and fecal incontinence.   Denies  dysphagia or odynophagia. Derm: Denies rash, itching, dry skin Psych: Denies depression, anxiety, memory loss, confusion. No homicidal or suicidal ideation.  Heme: Denies bruising, bleeding, and enlarged lymph nodes.  Physical Exam: BP 105/60  Pulse 69  Temp(Src) 97.8 F (36.6 C) (Temporal)  Ht 5\' 6"  (1.676 m)  Wt 267 lb 6.4 oz (121.292 kg)  BMI 43.16 kg/m2 General:   Alert and oriented. No distress noted. Pleasant and cooperative.  Head:  Normocephalic and atraumatic. Eyes:  Conjuctiva clear without scleral icterus. Mouth:  Oral mucosa pink and moist. Good dentition. No lesions. Neck:  Supple, without mass or thyromegaly. Heart:  S1, S2 present without murmurs, rubs, or gallops. Regular rate and rhythm. Abdomen:  +BS, soft, obese, non-tender and non-distended. No rebound or guarding. No HSM or masses noted. Msk:  Symmetrical without gross deformities. Normal posture. Extremities:  1+ edema bilaterally.  Neurologic:  Alert and  oriented x4;  grossly normal neurologically. Skin:  Intact without significant lesions or rashes. Cervical Nodes:  No significant cervical adenopathy. Psych:  Alert and cooperative. Normal mood and affect.

## 2010-12-06 NOTE — Assessment & Plan Note (Signed)
Takes Miralax daily, without adequate results. Hard stools. Not on probiotic or eating high fiber diet. Likely dietary factors playing a role here. Will increase Miralax to BID three days a week. The rest of the days, only once. Add probiotic, give high fiber diet handout. Should he still have difficulty, consider amitiza.   3 mos return

## 2010-12-07 ENCOUNTER — Other Ambulatory Visit: Payer: Self-pay | Admitting: Gastroenterology

## 2010-12-07 NOTE — Progress Notes (Signed)
Cc to PCP 

## 2010-12-14 ENCOUNTER — Telehealth: Payer: Self-pay | Admitting: Gastroenterology

## 2010-12-14 NOTE — Telephone Encounter (Signed)
Frank Hoover cant afford the Domperidone out of pocket and his medicaid dont pay for it. Patient is wondering if he can get something equivalent that medicaid will pay for?? Please advise??

## 2010-12-17 NOTE — Telephone Encounter (Signed)
Forwarding to Dr. Gala Romney per Dr. Oneida Alar.

## 2010-12-17 NOTE — Telephone Encounter (Signed)
forward to Dr. Gala Romney.

## 2010-12-30 NOTE — Telephone Encounter (Signed)
Sorry; nothing equivalent in [place] with acceptable safety profile

## 2011-01-16 ENCOUNTER — Telehealth: Payer: Self-pay

## 2011-01-16 NOTE — Telephone Encounter (Signed)
Frank Hoover, caregiver said that pt cannot afford the Domperidone and wants to know what to do. Does he need an OV appt to come in and discuss? Please advise! ( [name]'s call back number is [phone] )

## 2011-01-17 NOTE — Telephone Encounter (Signed)
Called [name] and left message on machine to call. Per Dr. Gala Romney, pt will need OV with extender to discuss his med needs since he cannot afford the Domperidone. She just needs to call back and schedule appt for him.

## 2011-01-22 ENCOUNTER — Ambulatory Visit: Payer: PRIVATE HEALTH INSURANCE | Admitting: Gastroenterology

## 2011-01-29 ENCOUNTER — Encounter: Payer: Self-pay | Admitting: Gastroenterology

## 2011-01-29 ENCOUNTER — Ambulatory Visit (INDEPENDENT_AMBULATORY_CARE_PROVIDER_SITE_OTHER): Payer: PRIVATE HEALTH INSURANCE | Admitting: Gastroenterology

## 2011-01-29 VITALS — BP 138/68 | HR 63 | Temp 98.2°F | Ht 66.0 in | Wt 270.8 lb

## 2011-01-29 DIAGNOSIS — K5900 Constipation, unspecified: Secondary | ICD-10-CM

## 2011-01-29 DIAGNOSIS — K3184 Gastroparesis: Secondary | ICD-10-CM

## 2011-01-29 DIAGNOSIS — K219 Gastro-esophageal reflux disease without esophagitis: Secondary | ICD-10-CM

## 2011-01-29 MED ORDER — LUBIPROSTONE 24 MCG PO CAPS: 24.0000 ug | ORAL_CAPSULE | Freq: Two times a day (BID) | ORAL | Status: DC

## 2011-01-29 MED ORDER — OMEPRAZOLE 20 MG PO CPDR: 20.0000 mg | DELAYED_RELEASE_CAPSULE | Freq: Two times a day (BID) | ORAL | Status: DC

## 2011-01-29 NOTE — Assessment & Plan Note (Signed)
Persistent constipation on MiraLax 17 g daily. Begin Amitiza 24 mcg twice a day with food. Prescription provided. He may continue MiraLax on a when necessary basis on days he does not have a bowel movement. Office visit in 8 weeks.

## 2011-01-29 NOTE — Progress Notes (Signed)
Primary Care Physician: Alonza Bogus, MD  Primary Gastroenterologist:  Garfield Cornea, MD   Chief Complaint  Patient presents with  . Follow-up    HPI: Frank Hoover is a 60 y.o. male here for followup of gastroesophageal reflux disease, gastroparesis, constipation. Recently he called in stating he could no longer afford the domperidone. It was costing him $80 per month. He has been off the medication now for about one week. States he can tell that he is off the medication. Denies nausea or vomiting, early satiety. He has had some burning in the chest especially when he wakes up in the morning. No symptoms postprandially. Seems to go away after midmorning. No dysphagia. No abdominal pain. BM couple of times per week. Taking MiraLax 17 g daily. Tells me he is also taking Amitiza once daily but we only gave him samples 2 months ago. No straining. No brbpr, melena.   Current Outpatient Prescriptions  Medication Sig Dispense Refill  . albuterol (PROAIR HFA) 108 (90 BASE) MCG/ACT inhaler Inhale 2 puffs into the lungs every 6 (six) hours as needed.        Marland Kitchen allopurinol (ZYLOPRIM) 300 MG tablet Take 300 mg by mouth daily.        Marland Kitchen amLODipine-benazepril (LOTREL) 5-10 MG per capsule Take 1 capsule by mouth daily.        . cloNIDine (CATAPRES) 0.2 MG tablet Take 0.2 mg by mouth 2 (two) times daily.        . colchicine 0.6 MG tablet Take 0.6 mg by mouth daily.        . furosemide (LASIX) 20 MG tablet Take 20 mg by mouth 2 (two) times daily.        Marland Kitchen gabapentin (NEURONTIN) 400 MG capsule Take 400 mg by mouth 3 (three) times daily.        Marland Kitchen glimepiride (AMARYL) 2 MG tablet Take 2 mg by mouth daily before breakfast.        . HYDROcodone-acetaminophen (NORCO) 5-325 MG per tablet Take 1 tablet by mouth every 6 (six) hours as needed.        . insulin NPH-insulin regular (NOVOLIN 70/30) (70-30) 100 UNIT/ML injection Inject 7 Units into the skin.        Marland Kitchen insulin regular (HUMULIN R,NOVOLIN R) 100 units/mL  injection Inject into the skin 3 (three) times daily before meals.        Marland Kitchen MIRALAX powder DISSOLVE 17 GM SCOOP IN 8 OZ. OF WATER AND DRINK ONCE DAILY AS NEEDEDFOR CONSTIPATION  850 g  11  . omeprazole (PRILOSEC) 20 MG capsule Take 20 mg by mouth daily.        . simvastatin (ZOCOR) 40 MG tablet Take 40 mg by mouth at bedtime.        Marland Kitchen zolpidem (AMBIEN) 10 MG tablet Take 10 mg by mouth at bedtime as needed.        Marland Kitchen UNABLE TO FIND Domperidone 10 mg Bid         Allergies as of 01/29/2011  . (No Known Allergies)    ROS:  General: Negative for anorexia, weight loss, fever, chills, fatigue, weakness. ENT: Negative for hoarseness, difficulty swallowing , nasal congestion. CV: Negative for chest pain, angina, palpitations, dyspnea on exertion, peripheral edema.  Respiratory: Negative for dyspnea at rest, dyspnea on exertion, cough, sputum, wheezing.  GI: See history of present illness. GU:  Negative for dysuria, hematuria, urinary incontinence, urinary frequency, nocturnal urination.  Endo: Negative for unusual weight change.  Physical Examination:   BP 138/68  Pulse 63  Temp(Src) 98.2 F (36.8 C) (Temporal)  Ht 5' 6"  (1.676 m)  Wt 270 lb 12.8 oz (122.834 kg)  BMI 43.71 kg/m2  General: Well-nourished, well-developed in no acute distress. Accompanied by spouse. Eyes: No icterus. Mouth: Oropharyngeal mucosa moist and pink , no lesions erythema or exudate. Lungs: Clear to auscultation bilaterally.  Heart: Regular rate and rhythm, no murmurs rubs or gallops.  Abdomen: Bowel sounds are normal, nontender, nondistended, no hepatosplenomegaly or masses, no abdominal bruits or hernia , no rebound or guarding.   Extremities: No lower extremity edema. No clubbing or deformities. Neuro: Alert and oriented x 4   Skin: Warm and dry, no jaundice.   Psych: Alert and cooperative, normal mood and affect.

## 2011-01-29 NOTE — Assessment & Plan Note (Signed)
Chronic GERD, and gastroparesis. Recently stopped domperidone due to expense. At this point he is having symptoms more consistent with heartburn/reflux. Discussed other options for promotility agents specifically reglan and potential for side effects including permanent tardive dyskinesia. Initially would like to try getting heartburn symptoms under control. We'll increase his Prilosec to 20 mg twice a day. Antireflux measures discussed. Gastroparesis diet including eating 5-6 small meals daily discussed with patient. Office visit in 8 weeks. If continues to be asymptomatic with these measures, consider Reglan.

## 2011-01-29 NOTE — Patient Instructions (Signed)
#1. I would like for you to increase the omeprazole to twice daily, take first dose 30 minutes before breakfast and second dose 30 minutes before your evening meal. New prescription sent to [facility].  #2. Take Amitiza twice a day for constipation. Please take with food. You may use MiraLax 17 g once daily as needed on days that you do not have a bowel movement. Otherwise, just take, Amitiza. #3. Please follow a gastroparesis diet to help your stomach empty better. You should avoid over eating, eating fried/fatty foods. See below for diet. #4. Please come back and see [place] in 8 weeks.  Gastroparesis  Gastroparesis is also called slowed stomach emptying (delayed gastric emptying). It is a condition in which the stomach takes too long to empty its contents. It often happens in people with diabetes.  CAUSES  Gastroparesis happens when nerves to the stomach are damaged or stop working. When the nerves are damaged, the muscles of the stomach and intestines do not work normally. The movement of food is slowed or stopped. High blood glucose (sugar) causes changes in nerves and can damage the blood vessels that carry oxygen and nutrients to the nerves. RISK FACTORS  Diabetes.   Post-viral syndromes.   Eating disorders (anorexia, bulimia).   Surgery on the stomach or vagus nerve.   Gastroesophageal reflux disease (rarely).   Smooth muscle disorders (amyloidosis, scleroderma).   Metabolic disorders, including hypothyroidism.   Parkinson's disease.  SYMPTOMS   Heartburn.   Feeling sick to your stomach (nausea).   Vomiting of undigested food.   An early feeling of fullness when eating.   Weight loss.   Abdominal bloating.   Erratic blood glucose levels.   Lack of appetite.   Gastroesophageal reflux.   Spasms of the stomach wall.  Complications can include:  Bacterial overgrowth in stomach. Food stays in the stomach and can ferment and cause bacteria to grow.   Weight loss  due to difficulty digesting and absorbing nutrients.   Vomiting.   Obstruction in the stomach. Undigested food can harden and cause nausea and vomiting.   Blood glucose fluctuations caused by inconsistent food absorption.  DIAGNOSIS  The diagnosis of gastroparesis is confirmed through one or more of the following tests:  Barium X-rays and scans. These tests look at how long it takes for food to move through the stomach.   Gastric manometry. This test measures electrical and muscular activity in the stomach. A thin tube is passed down the throat into the stomach. The tube contains a wire that takes measurements of the stomach's electrical and muscular activity as it digests liquids and solid food.   Endoscopy. This procedure is done with a long, thin tube called an endoscope. It is passed through the mouth and gently guides down the esophagus into the stomach. This tube helps the caregiver look at the lining of the stomach to check for any abnormalities.   Ultrasound. This can rule out gallbladder disease or pancreatitis. This test will outline and define the shape of the gallbladder and pancreas.  TREATMENT   The primary treatment is to identify the problem and help control blood glucose levels. Treatments include:   Exercise.   Medicines to control nausea and vomiting.   Medicines to stimulate stomach muscles.   Changes in what and when you eat.   Having smaller meals more often.   Eating low-fiber forms of high-fiber foods, such aseating cooked vegetables instead of raw vegetables.   Eating low-fat foods.  Consuming liquids, which are easier to digest.   In severe cases, feeding tubes and intravenous (IV) feeding may be needed.  It is important to note that in most cases, treatment does not cure gastroparesis. It is usually a lasting (chronic) condition. Treatment helps you manage the condition so that you can be as healthy and comfortable as possible. NEW TREATMENTS  A  gastric neurostimulator has been developed to assist people with gastroparesis. The battery-operated device is surgically implanted. It emits mild electrical pulses to help improve stomach emptying and to control nausea and vomiting.   The use of botulinum toxin has been shown to improve stomach emptying by decreasing the prolonged contractions of the muscle between the stomach and the small intestine (pyloric sphincter). The benefits are temporary.  SEEK MEDICAL CARE IF:   You are having problems keeping your blood glucose in goal range.   You are having nausea, vomiting, bloating, or early feelings of fullness with eating.   Your symptoms do not change with a change in diet.  Document Released: 02/18/2005 Document Revised: 10/31/2010 Document Reviewed: 07/28/2008 [facility] Patient Information 2012 [place].  Diet for GERD or PUD Nutrition therapy can help ease the discomfort of gastroesophageal reflux disease (GERD) and peptic ulcer disease (PUD).  HOME CARE INSTRUCTIONS   Eat your meals slowly, in a relaxed setting.   Eat 5 to 6 small meals per day.   If a food causes distress, stop eating it for a period of time.  FOODS TO AVOID  Coffee, regular or decaffeinated.   Cola beverages, regular or low calorie.   Tea, regular or decaffeinated.   Pepper.   Cocoa.   High fat foods, including meats.   Butter, margarine, hydrogenated oil (trans fats).   Peppermint or spearmint (if you have GERD).   Fruits and vegetables if not tolerated.   Alcohol.   Nicotine (smoking or chewing). This is one of the most potent stimulants to acid production in the gastrointestinal tract.   Any food that seems to aggravate your condition.  If you have questions regarding your diet, ask your caregiver or a registered dietitian. TIPS  Lying flat may make symptoms worse. Keep the head of your bed raised 6 to 9 inches (15 to 23 cm) by using a foam wedge or blocks under the legs of the bed.    Do not lay down until 3 hours after eating a meal.   Daily physical activity may help reduce symptoms.  MAKE SURE YOU:   Understand these instructions.   Will watch your condition.   Will get help right away if you are not doing well or get worse.  Document Released: 02/18/2005 Document Revised: 10/31/2010 Document Reviewed: 07/04/2008 [facility] Patient Information 2012 [place].

## 2011-03-12 ENCOUNTER — Ambulatory Visit: Payer: PRIVATE HEALTH INSURANCE | Admitting: Gastroenterology

## 2011-03-25 ENCOUNTER — Encounter: Payer: Self-pay | Admitting: Internal Medicine

## 2011-03-26 ENCOUNTER — Ambulatory Visit: Payer: PRIVATE HEALTH INSURANCE | Admitting: Gastroenterology

## 2011-04-02 ENCOUNTER — Encounter: Payer: Self-pay | Admitting: Gastroenterology

## 2011-04-02 ENCOUNTER — Ambulatory Visit (INDEPENDENT_AMBULATORY_CARE_PROVIDER_SITE_OTHER): Payer: PRIVATE HEALTH INSURANCE | Admitting: Gastroenterology

## 2011-04-02 DIAGNOSIS — K3184 Gastroparesis: Secondary | ICD-10-CM

## 2011-04-02 DIAGNOSIS — K219 Gastro-esophageal reflux disease without esophagitis: Secondary | ICD-10-CM

## 2011-04-02 DIAGNOSIS — K5900 Constipation, unspecified: Secondary | ICD-10-CM

## 2011-04-02 NOTE — Assessment & Plan Note (Signed)
Managing with gastroparesis diet.

## 2011-04-02 NOTE — Patient Instructions (Signed)
Continue the good work. Continue your omeprazole and amitiza. Continue eating small frequent meals instead of overeating. Avoid fatty/greasy foods.  We will see you back in one year or sooner if you are having any problems.

## 2011-04-02 NOTE — Progress Notes (Signed)
Faxed to PCP

## 2011-04-02 NOTE — Assessment & Plan Note (Signed)
Continue prilosec 20mg  bid. OV in 1 year or sooner if needed.

## 2011-04-02 NOTE — Progress Notes (Signed)
Primary Care Physician: Alonza Bogus, MD, MD  Primary Gastroenterologist:  Garfield Cornea, MD   Chief Complaint  Patient presents with  . Follow-up    HPI: Frank Hoover is a 61 y.o. male here for f/u gerd, gastroparesis, constipation. No longer on domperidone. Increase in omeprazole to bid has helped his reflux. Amitiza working for constipation. Denies n/v, early satiety, abd pain, heartburn, dysphagia, melena, brbpr.  Current Outpatient Prescriptions  Medication Sig Dispense Refill  . albuterol (PROAIR HFA) 108 (90 BASE) MCG/ACT inhaler Inhale 2 puffs into the lungs every 6 (six) hours as needed.        Marland Kitchen allopurinol (ZYLOPRIM) 300 MG tablet Take 300 mg by mouth daily.        Marland Kitchen amLODipine-benazepril (LOTREL) 5-10 MG per capsule Take 1 capsule by mouth daily.        . cloNIDine (CATAPRES) 0.2 MG tablet Take 0.2 mg by mouth 2 (two) times daily.        . colchicine 0.6 MG tablet Take 0.6 mg by mouth daily.        . furosemide (LASIX) 20 MG tablet Take 20 mg by mouth 2 (two) times daily.        Marland Kitchen gabapentin (NEURONTIN) 400 MG capsule Take 400 mg by mouth 3 (three) times daily.        Marland Kitchen glimepiride (AMARYL) 2 MG tablet Take 2 mg by mouth daily before breakfast.        . HYDROcodone-acetaminophen (NORCO) 5-325 MG per tablet Take 1 tablet by mouth every 6 (six) hours as needed.        . insulin NPH-insulin regular (NOVOLIN 70/30) (70-30) 100 UNIT/ML injection Inject 7 Units into the skin.        Marland Kitchen insulin regular (HUMULIN R,NOVOLIN R) 100 units/mL injection Inject into the skin 3 (three) times daily before meals.        Marland Kitchen MIRALAX powder DISSOLVE 17 GM SCOOP IN 8 OZ. OF WATER AND DRINK ONCE DAILY AS NEEDEDFOR CONSTIPATION  850 g  11  . omeprazole (PRILOSEC) 20 MG capsule Take 1 capsule (20 mg total) by mouth 2 (two) times daily before a meal.  60 capsule  5  . simvastatin (ZOCOR) 40 MG tablet Take 40 mg by mouth at bedtime.        Marland Kitchen zolpidem (AMBIEN) 10 MG tablet Take 10 mg by mouth at  bedtime as needed.          Allergies as of 04/02/2011  . (No Known Allergies)    ROS:  General: Negative for anorexia, weight loss, fever, chills, fatigue, weakness. ENT: Negative for hoarseness, difficulty swallowing , nasal congestion. CV: Negative for chest pain, angina, palpitations, dyspnea on exertion, peripheral edema.  Respiratory: Negative for dyspnea at rest, dyspnea on exertion, cough, sputum, wheezing.  GI: See history of present illness. GU:  Negative for dysuria, hematuria, urinary incontinence, urinary frequency, nocturnal urination.  Endo: Negative for unusual weight change.    Physical Examination:   BP 132/70  Pulse 61  Temp(Src) 97.6 F (36.4 C) (Temporal)  Ht 5\' 6"  (1.676 m)  Wt 270 lb 9.6 oz (122.743 kg)  BMI 43.68 kg/m2  General: Well-nourished, well-developed in no acute distress.  Eyes: No icterus. Mouth: Oropharyngeal mucosa moist and pink , no lesions erythema or exudate. Lungs: Clear to auscultation bilaterally.  Heart: Regular rate and rhythm, no murmurs rubs or gallops.  Abdomen: Bowel sounds are normal, nontender, nondistended, no hepatosplenomegaly or masses, no abdominal bruits or  hernia , no rebound or guarding.   Extremities: No lower extremity edema. No clubbing or deformities. Neuro: Alert and oriented x 4   Skin: Warm and dry, no jaundice.   Psych: Alert and cooperative, normal mood and affect.

## 2011-04-02 NOTE — Assessment & Plan Note (Signed)
Continue amitiza 56mcg bid.

## 2011-05-13 ENCOUNTER — Other Ambulatory Visit: Payer: Self-pay | Admitting: Neurology

## 2011-05-24 ENCOUNTER — Other Ambulatory Visit: Payer: Self-pay | Admitting: Gastroenterology

## 2011-05-27 ENCOUNTER — Other Ambulatory Visit: Payer: Self-pay

## 2011-05-27 NOTE — Telephone Encounter (Signed)
duplicate

## 2011-05-29 ENCOUNTER — Ambulatory Visit: Payer: PRIVATE HEALTH INSURANCE | Attending: Neurology | Admitting: Sleep Medicine

## 2011-05-29 DIAGNOSIS — Z6841 Body Mass Index (BMI) 40.0 and over, adult: Secondary | ICD-10-CM | POA: Insufficient documentation

## 2011-05-29 DIAGNOSIS — G4700 Insomnia, unspecified: Secondary | ICD-10-CM

## 2011-05-29 DIAGNOSIS — G4733 Obstructive sleep apnea (adult) (pediatric): Secondary | ICD-10-CM | POA: Insufficient documentation

## 2011-06-01 NOTE — Procedures (Signed)
NAME:  Frank Hoover, Frank Hoover              ACCOUNT NO.:  [id number]  MEDICAL RECORD NO.:  [id number]          PATIENT TYPE:  OUT  LOCATION:  SLEEP LAB                     FACILITY:  [facility]  PHYSICIAN:  Shivali Quackenbush A. Merlene Laughter, M.D. DATE OF BIRTH:  1950-08-09  DATE OF STUDY:  05/29/2011                           NOCTURNAL POLYSOMNOGRAM  REFERRING PHYSICIAN:  Percell Miller L. Luan Pulling, M.D.  REFERRING PHYSICIAN:  Traniya Prichett A. Merlene Laughter, M.D.  INDICATION:  A 61 year old man who presents with fatigue, hypersomnia, restless sleep, and insomnia.  This study is being done to evaluate for obstructive sleep apnea syndrome.  There is also a history of hypertension and obesity.  MEDICATIONS:  Amlodipine, benazepril, allopurinol, glimepiride, omeprazole, furosemide, Amitiza, metoprolol, gabapentin, clonidine, hydrocodone, simvastatin, and zolpidem.  EPWORTH SLEEPINESS SCALE: 1. BMI 44.  ARCHITECTURAL SUMMARY:  This is a full night recording with the total recording time being 368 minutes.  Sleep efficiency 67%, sleep latency 4.5 minutes.  REM latency 49 minutes, stage N1 7%, N2 68%, N3 3%, and REM sleep 22%.  RESPIRATORY SUMMARY:  Baseline oxygen saturation 95, lowest saturation 75 during REM sleep, and the lowest saturation during non-REM sleep was 85, however, the patient had extensive bouts of hypoxia not associated with respiratory, apneic or hypopneic events.  The AHI is 10 and RDI 11. Events attended to occur during REM sleep with the REM AHI being 22.  LIMB MOVEMENT SUMMARY:  PLM index 5.  ELECTROCARDIOGRAM SUMMARY:  Average heart rate is 58 with no significant dysrhythmias observed.  IMPRESSION: 1. Mild to moderate obstructive sleep apnea syndrome. 2. Hypoventilation syndrome. 3. Mild periodic limb movement disorder sleep.  RECOMMENDATIONS:  Formal CPAP titration study.    Berlyn Malina A. Merlene Laughter, M.D.    KAD/MEDQ  D:  06/01/2011 17:00:22  T:  06/01/2011 23:05:31  Job:  [id number]

## 2011-11-21 ENCOUNTER — Other Ambulatory Visit: Payer: Self-pay | Admitting: Urgent Care

## 2012-02-20 ENCOUNTER — Encounter: Payer: Self-pay | Admitting: *Deleted

## 2012-03-16 ENCOUNTER — Encounter: Payer: Self-pay | Admitting: Internal Medicine

## 2012-03-17 ENCOUNTER — Ambulatory Visit (INDEPENDENT_AMBULATORY_CARE_PROVIDER_SITE_OTHER): Payer: PRIVATE HEALTH INSURANCE | Admitting: Gastroenterology

## 2012-03-17 ENCOUNTER — Encounter: Payer: Self-pay | Admitting: Gastroenterology

## 2012-03-17 VITALS — BP 168/79 | HR 62 | Temp 98.2°F | Ht 66.0 in | Wt 266.4 lb

## 2012-03-17 DIAGNOSIS — K5900 Constipation, unspecified: Secondary | ICD-10-CM

## 2012-03-17 DIAGNOSIS — K3184 Gastroparesis: Secondary | ICD-10-CM

## 2012-03-17 DIAGNOSIS — Z86010 Personal history of colonic polyps: Secondary | ICD-10-CM

## 2012-03-17 DIAGNOSIS — K219 Gastro-esophageal reflux disease without esophagitis: Secondary | ICD-10-CM

## 2012-03-17 MED ORDER — LINACLOTIDE 290 MCG PO CAPS: 1.0000 | ORAL_CAPSULE | Freq: Every day | ORAL | Status: DC

## 2012-03-17 NOTE — Assessment & Plan Note (Signed)
Well-controlled with PPI. Continue diet.

## 2012-03-17 NOTE — Assessment & Plan Note (Signed)
Surveillance Aug 2014. 

## 2012-03-17 NOTE — Progress Notes (Signed)
Referring Provider: Alonza Bogus, MD Primary Care Physician:  Alonza Bogus, MD Primary GI: Dr. Gala Romney   Chief Complaint  Patient presents with  . Nausea  . Abdominal Pain    HPI:   62 year old pleasant male who presents today in follow-up for GERD, gastroparesis, and constipation. Next colonoscopy due Aug 2014 due to hx of adenomatous polyps. Last EGD Aug 2011: normal.  BM every 3 days. Amitiza 24 mcg po BID. GERD controlled on Omeprazole BID. Denies N/V.  Notes upper abdominal pain, intermittently, states "then when my bowels move, I don't have it no more". [place] nurse calls and reviews diet with him. "They've got me on a diet". No rectal bleeding.   Past Medical History  Diagnosis Date  . Gout   . CRF (chronic renal failure)   . COPD (chronic obstructive pulmonary disease)   . DM (diabetes mellitus)   . GERD (gastroesophageal reflux disease)   . HTN (hypertension)   . Hyperlipemia   . OA (osteoarthritis)   . Gastroparesis 11/01/09  . Adenomatous colon polyp 10/30/09    serrated adenoma removed during Colonoscopy   . Diverticulosis of colon     Past Surgical History  Procedure Date  . Tonsillectomy   . Cataract extraction     right  . Esophagogastroduodenoscopy 10/30/09    [id number]   . Colonoscopy 10/30/09    [id number] diverticulum/serrated adenoma from ICV, next colonoscopy due 10/2012    Current Outpatient Prescriptions  Medication Sig Dispense Refill  . albuterol (PROAIR HFA) 108 (90 BASE) MCG/ACT inhaler Inhale 2 puffs into the lungs every 6 (six) hours as needed.        Marland Kitchen allopurinol (ZYLOPRIM) 300 MG tablet Take 300 mg by mouth daily.        Marland Kitchen amLODipine-benazepril (LOTREL) 10-40 MG per capsule Take 1 capsule by mouth daily.       . cloNIDine (CATAPRES) 0.2 MG tablet Take 0.2 mg by mouth 2 (two) times daily.        . colchicine 0.6 MG tablet Take 0.6 mg by mouth daily.        . furosemide (LASIX) 20 MG tablet Take 20 mg by mouth 2  (two) times daily.        Marland Kitchen gabapentin (NEURONTIN) 400 MG capsule Take 400 mg by mouth 3 (three) times daily.        Marland Kitchen glimepiride (AMARYL) 2 MG tablet Take 2 mg by mouth daily before breakfast.        . HYDROcodone-acetaminophen (NORCO) 5-325 MG per tablet Take 1 tablet by mouth every 6 (six) hours as needed.        . insulin NPH-insulin regular (NOVOLIN 70/30) (70-30) 100 UNIT/ML injection Inject 7 Units into the skin.        Marland Kitchen insulin regular (HUMULIN R,NOVOLIN R) 100 units/mL injection Inject 18 Units into the skin 3 (three) times daily before meals.       . lubiprostone (AMITIZA) 24 MCG capsule Take 24 mcg by mouth 2 (two) times daily with a meal.      . lubiprostone (AMITIZA) 24 MCG capsule Take 1 capsule (24 mcg total) by mouth 2 (two) times daily as needed for constipation.  62 capsule  5  . metoprolol (LOPRESSOR) 100 MG tablet Take 100 mg by mouth 3 (three) times daily.       Marland Kitchen MIRALAX powder DISSOLVE 17 GM SCOOP IN 8 OZ. OF WATER AND DRINK ONCE DAILY AS NEEDEDFOR CONSTIPATION  850  g  11  . omeprazole (PRILOSEC) 20 MG capsule Take 1 capsule (20 mg total) by mouth 2 (two) times daily before a meal.  60 capsule  5  . simvastatin (ZOCOR) 40 MG tablet Take 40 mg by mouth at bedtime.        Marland Kitchen zolpidem (AMBIEN) 10 MG tablet Take 10 mg by mouth at bedtime as needed.        Marland Kitchen amLODipine-benazepril (LOTREL) 5-10 MG per capsule Take 1 capsule by mouth daily.          Allergies as of 03/17/2012  . (No Known Allergies)    Family History  Problem Relation Age of Onset  . Diabetes Brother     mother  . COPD Father   . Hypertension Mother     History   Social History  . Marital Status: Married    Spouse Name: N/A    Number of Children: 2  . Years of Education: N/A   Occupational History  . retired    Social History Main Topics  . Smoking status: Former Smoker    Quit date: 08/26/2010  . Smokeless tobacco: None  . Alcohol Use: No  . Drug Use: No  . Sexually Active: None    Other Topics Concern  . None   Social History Narrative  . None    Review of Systems: Negative unless mentioned in HPI.  Physical Exam: BP 168/79  Pulse 62  Temp 98.2 F (36.8 C) (Oral)  Ht 5\' 6"  (1.676 m)  Wt 266 lb 6.4 oz (120.838 kg)  BMI 43.00 kg/m2 General:   Alert and oriented. No distress noted. Pleasant and cooperative.  Head:  Normocephalic and atraumatic. Eyes:  Conjuctiva clear without scleral icterus. Heart:  S1, S2 present without murmurs, rubs, or gallops. Regular rate and rhythm. Abdomen:  +BS, soft, large AP diameter and obese. non-tender and non-distended. No rebound or guarding. No HSM or masses noted. Msk:  Symmetrical without gross deformities. Normal posture. Extremities:  1-2+ pitting edema bilaterally Neurologic:  Alert and  oriented x4;  grossly normal neurologically. Skin:  Intact without significant lesions or rashes. Psych:  Alert and cooperative. Normal mood and affect.

## 2012-03-17 NOTE — Patient Instructions (Addendum)
Stop Amitiza.   Start taking Linzess, one capsule each morning. Make sure you take this 30 minutes before breakfast.   Contact us if no improvement with bowel movements or continued belly discomfort.  Otherwise, we will see you in 3 months.  You will need another colonoscopy in August 2014. We will talk about that at your next visit.

## 2012-03-17 NOTE — Assessment & Plan Note (Addendum)
Controlled with Prilosec BID. No dysphagia.

## 2012-03-17 NOTE — Progress Notes (Signed)
Faxed to PCP

## 2012-03-17 NOTE — Assessment & Plan Note (Signed)
Chronic, currently on Amitiza 24 mcg BID. BM every 3 days, with associated upper abdominal discomfort relieved after defecation. Constipation likely source of abdominal discomfort; trial Linzess 290 mcg daily. Prescription and samples provided. 3 month f/u. TCS in Aug 2014 for surveillance.

## 2012-06-15 ENCOUNTER — Ambulatory Visit: Payer: PRIVATE HEALTH INSURANCE | Admitting: Gastroenterology

## 2012-06-29 ENCOUNTER — Encounter: Payer: Self-pay | Admitting: Gastroenterology

## 2012-06-29 ENCOUNTER — Ambulatory Visit (INDEPENDENT_AMBULATORY_CARE_PROVIDER_SITE_OTHER): Payer: PRIVATE HEALTH INSURANCE | Admitting: Gastroenterology

## 2012-06-29 VITALS — BP 128/77 | HR 69 | Temp 97.6°F | Ht 66.0 in | Wt 260.8 lb

## 2012-06-29 DIAGNOSIS — K5900 Constipation, unspecified: Secondary | ICD-10-CM

## 2012-06-29 DIAGNOSIS — K3184 Gastroparesis: Secondary | ICD-10-CM

## 2012-06-29 DIAGNOSIS — Z86010 Personal history of colonic polyps: Secondary | ICD-10-CM

## 2012-06-29 NOTE — Progress Notes (Signed)
[facility]c PCP 

## 2012-06-29 NOTE — Assessment & Plan Note (Addendum)
Doing well with Linzess 290 mcg daily, with Miralax prn. Add supplemental fiber daily. Return in July to schedule surveillance [facility]. As of note, physical exam with ?hepatomegaly. [place] of abdomen ordered.

## 2012-06-29 NOTE — Assessment & Plan Note (Signed)
Continue PPI BID. Stable.

## 2012-06-29 NOTE — Patient Instructions (Addendum)
We have scheduled you for an ultrasound of your belly. We will call with these results.  Please start adding supplemental fiber to your diet such as Metamucil or Benefiber daily.   Continue to take Linzess 1 capsule each morning. You may take Miralax as needed.   Return in July to schedule colonoscopy.

## 2012-06-29 NOTE — Assessment & Plan Note (Signed)
Due for surveillance Aug 2014. Return in July for [place].

## 2012-06-29 NOTE — Progress Notes (Signed)
Referring Provider: Alonza Bogus, MD Primary Care Physician:  Alonza Bogus, MD Primary GI: Dr. Gala Romney   Chief Complaint  Patient presents with  . Follow-up    HPI:   62 year old pleasant male who presents today in follow-up for GERD, gastroparesis, and constipation. Next colonoscopy due Aug 2014 due to hx of adenomatous polyps. Last EGD Aug 2011: normal.  Last seen by myself in Jan 2014, with abdominal pain noted but relieved with defecation. Has tried Amitiza 24 mcg in the past. Started on Linzess 290 mcg at that time. States stomach gave him a little trouble on Saturday. Linzess was working well each morning, then was less than ideal. States did well for about 2-3 weeks. Didn't have to take Miralax. Now takes Miralax as needed and has better results when combined with daily Linzess. No N/V, no GERD. Appetite good. On a "diet" per [employer]. Notes improvement in upper abdominal discomfort. Sometimes takes a gas pill with improvement. No pain with eating. Abdominal discomfort associated with need for BM.    Past Medical History  Diagnosis Date  . Gout   . CRF (chronic renal failure)   . COPD (chronic obstructive pulmonary disease)   . DM (diabetes mellitus)   . GERD (gastroesophageal reflux disease)   . HTN (hypertension)   . Hyperlipemia   . OA (osteoarthritis)   . Gastroparesis 11/01/09  . Adenomatous colon polyp 10/30/09    serrated adenoma removed during Colonoscopy   . Diverticulosis of colon     Past Surgical History  Procedure Laterality Date  . Tonsillectomy    . Cataract extraction      right  . Esophagogastroduodenoscopy  10/30/09    [id number]   . Colonoscopy  10/30/09    [id number] diverticulum/serrated adenoma from ICV, next colonoscopy due 10/2012    Current Outpatient Prescriptions  Medication Sig Dispense Refill  . albuterol (PROAIR HFA) 108 (90 BASE) MCG/ACT inhaler Inhale 2 puffs into the lungs every 6 (six) hours as needed.        Marland Kitchen  allopurinol (ZYLOPRIM) 300 MG tablet Take 300 mg by mouth daily.        Marland Kitchen amLODipine-benazepril (LOTREL) 10-40 MG per capsule Take 1 capsule by mouth daily.       . cloNIDine (CATAPRES) 0.2 MG tablet Take 0.2 mg by mouth 2 (two) times daily.        . colchicine 0.6 MG tablet Take 0.6 mg by mouth daily.        . furosemide (LASIX) 20 MG tablet Take 20 mg by mouth 2 (two) times daily.        Marland Kitchen gabapentin (NEURONTIN) 300 MG capsule Take 300 mg by mouth 3 (three) times daily.       Marland Kitchen glimepiride (AMARYL) 2 MG tablet Take 2 mg by mouth daily before breakfast.        . HYDROcodone-acetaminophen (NORCO) 5-325 MG per tablet Take 1 tablet by mouth every 6 (six) hours as needed.        . insulin NPH-insulin regular (NOVOLIN 70/30) (70-30) 100 UNIT/ML injection Inject 7 Units into the skin.        Marland Kitchen insulin regular (HUMULIN R,NOVOLIN R) 100 units/mL injection Inject 18 Units into the skin 3 (three) times daily before meals.       . Linaclotide 290 MCG CAPS Take 1 capsule by mouth daily. 30 minutes before breakfast.  30 capsule  3  . metoprolol (LOPRESSOR) 100 MG tablet Take 100 mg by mouth  3 (three) times daily.       Marland Kitchen MIRALAX powder DISSOLVE 17 GM SCOOP IN 8 OZ. OF WATER AND DRINK ONCE DAILY AS NEEDEDFOR CONSTIPATION  850 g  11  . omeprazole (PRILOSEC) 20 MG capsule Take 1 capsule (20 mg total) by mouth 2 (two) times daily before a meal.  60 capsule  5  . simvastatin (ZOCOR) 40 MG tablet Take 40 mg by mouth at bedtime.        Marland Kitchen zolpidem (AMBIEN) 10 MG tablet Take 10 mg by mouth at bedtime as needed.         No current facility-administered medications for this visit.    Allergies as of 06/29/2012  . (No Known Allergies)    Family History  Problem Relation Age of Onset  . Diabetes Brother     mother  . COPD Father   . Hypertension Mother     History   Social History  . Marital Status: Married    Spouse Name: N/A    Number of Children: 2  . Years of Education: N/A   Occupational History   . retired    Social History Main Topics  . Smoking status: Former Smoker    Quit date: 08/26/2010  . Smokeless tobacco: None  . Alcohol Use: No  . Drug Use: No  . Sexually Active: None   Other Topics Concern  . None   Social History Narrative  . None    Review of Systems: Negative unless mentioned in HPI  Physical Exam: BP 128/77  Pulse 69  Temp(Src) 97.6 F (36.4 C) (Oral)  Ht 5\' 6"  (1.676 m)  Wt 260 lb 12.8 oz (118.298 kg)  BMI 42.11 kg/m2 General:   Alert and oriented. No distress noted. Pleasant and cooperative.  Head:  Normocephalic and atraumatic. Eyes:  Conjuctiva clear without scleral icterus. Neck:  Supple, without mass or thyromegaly. Heart:  S1, S2 present without murmurs, rubs, or gallops. Regular rate and rhythm. Abdomen:  +BS, soft, obese, query hepatomegaly but difficult to appreciate due to large AP diameter. non-tender and non-distended. No rebound or guarding.  Msk:  Symmetrical without gross deformities. Normal posture. Extremities:  Without edema. Neurologic:  Alert and  oriented x4;  grossly normal neurologically. Skin:  Intact without significant lesions or rashes. Cervical Nodes:  No significant cervical adenopathy. Psych:  Alert and cooperative. Normal mood and affect.

## 2012-07-02 ENCOUNTER — Ambulatory Visit (HOSPITAL_COMMUNITY): Payer: PRIVATE HEALTH INSURANCE

## 2012-07-10 ENCOUNTER — Ambulatory Visit (HOSPITAL_COMMUNITY)
Admission: RE | Admit: 2012-07-10 | Discharge: 2012-07-10 | Disposition: A | Discharge status: Home / Self Care | Payer: PRIVATE HEALTH INSURANCE | Source: Ambulatory Visit | Attending: Gastroenterology | Admitting: Gastroenterology

## 2012-07-10 DIAGNOSIS — R160 Hepatomegaly, not elsewhere classified: Secondary | ICD-10-CM | POA: Insufficient documentation

## 2012-07-10 DIAGNOSIS — K5900 Constipation, unspecified: Secondary | ICD-10-CM

## 2012-07-20 NOTE — Progress Notes (Signed)
Quick Note:  [place] of abdomen reviewed. Liver only mildly enlarged. Let's get a set of LFTs on file. Otherwise, continue with current regimen and f/u as planned. ______

## 2012-07-22 ENCOUNTER — Other Ambulatory Visit: Payer: Self-pay | Admitting: Gastroenterology

## 2012-07-24 NOTE — Progress Notes (Signed)
Quick Note:  Tried to call pt- NA ______ 

## 2012-07-30 ENCOUNTER — Other Ambulatory Visit: Payer: Self-pay | Admitting: Gastroenterology

## 2012-07-30 ENCOUNTER — Other Ambulatory Visit: Payer: Self-pay

## 2012-07-30 DIAGNOSIS — R1013 Epigastric pain: Secondary | ICD-10-CM

## 2012-09-02 ENCOUNTER — Ambulatory Visit (INDEPENDENT_AMBULATORY_CARE_PROVIDER_SITE_OTHER): Payer: PRIVATE HEALTH INSURANCE | Admitting: Gastroenterology

## 2012-09-02 ENCOUNTER — Encounter: Payer: Self-pay | Admitting: Gastroenterology

## 2012-09-02 VITALS — BP 136/71 | HR 75 | Temp 98.6°F | Ht 66.0 in | Wt 267.0 lb

## 2012-09-02 DIAGNOSIS — D369 Benign neoplasm, unspecified site: Secondary | ICD-10-CM

## 2012-09-02 DIAGNOSIS — Z86010 Personal history of colonic polyps: Secondary | ICD-10-CM

## 2012-09-02 MED ORDER — PEG 3350-KCL-NA BICARB-NACL 420 G PO SOLR: 4000.0000 mL | ORAL | Status: DC

## 2012-09-02 NOTE — Patient Instructions (Addendum)
We have scheduled you for a colonoscopy with Dr. Gala Romney.  DIABETES MEDICATIONS BEFORE PROCEDURE: No Amaryl the day of the procedure

## 2012-09-02 NOTE — Progress Notes (Signed)
Referring Provider: Hawkins, Edward L, MD Primary Care Physician:  HAWKINS,EDWARD L, MD Primary GI: Dr. Rourk   Chief Complaint  Patient presents with  . Follow-up    HPI:   62-year-old pleasant male who presents today in follow-up for GERD, gastroparesis, and constipation. Due for colonoscopy  Aug 2014 due to hx of adenomatous polyps. Last EGD Aug 2011: normal.   Last seen by myself in April 2014, doing well with Linzess 290 mcg daily. US of abdomen noted mild hepatomegaly; needs updated LFTs. Has BM every morning, but he sometimes has to take Miralax. No abdominal pain. No N/V. No reflux symptoms or dysphagia.    Past Medical History  Diagnosis Date  . Gout   . CRF (chronic renal failure)   . COPD (chronic obstructive pulmonary disease)   . DM (diabetes mellitus)   . GERD (gastroesophageal reflux disease)   . HTN (hypertension)   . Hyperlipemia   . OA (osteoarthritis)   . Gastroparesis 11/01/09  . Adenomatous colon polyp 10/30/09    serrated adenoma removed during Colonoscopy   . Diverticulosis of colon     Past Surgical History  Procedure Laterality Date  . Tonsillectomy    . Cataract extraction      right  . Esophagogastroduodenoscopy  10/30/09    RMR:normal   . Colonoscopy  10/30/09    RMR:left-sided diverticulum/serrated adenoma from ICV, next colonoscopy due 10/2012    Current Outpatient Prescriptions  Medication Sig Dispense Refill  . albuterol (PROAIR HFA) 108 (90 BASE) MCG/ACT inhaler Inhale 2 puffs into the lungs every 6 (six) hours as needed.        . allopurinol (ZYLOPRIM) 300 MG tablet Take 300 mg by mouth daily.        . amLODipine-benazepril (LOTREL) 10-40 MG per capsule Take 1 capsule by mouth daily.       . cloNIDine (CATAPRES) 0.2 MG tablet Take 0.2 mg by mouth 2 (two) times daily.        . colchicine 0.6 MG tablet Take 0.6 mg by mouth daily.        . furosemide (LASIX) 20 MG tablet Take 20 mg by mouth 2 (two) times daily.        . gabapentin  (NEURONTIN) 300 MG capsule Take 300 mg by mouth 3 (three) times daily.       . glimepiride (AMARYL) 2 MG tablet Take 2 mg by mouth daily before breakfast.        . HYDROcodone-acetaminophen (NORCO) 5-325 MG per tablet Take 1 tablet by mouth every 6 (six) hours as needed.        . insulin NPH-insulin regular (NOVOLIN 70/30) (70-30) 100 UNIT/ML injection Inject 7 Units into the skin.        . insulin regular (HUMULIN R,NOVOLIN R) 100 units/mL injection Inject 18 Units into the skin 3 (three) times daily before meals.       . LINZESS 290 MCG CAPS TAKE 1 CAPSULE DAILY 30 MINUTES BEFORE BREAKFAST  30 capsule  3  . metoprolol (LOPRESSOR) 100 MG tablet Take 100 mg by mouth 3 (three) times daily.       . MIRALAX powder DISSOLVE 17 GM SCOOP IN 8 OZ. OF WATER AND DRINK ONCE DAILY AS NEEDEDFOR CONSTIPATION  850 g  11  . omeprazole (PRILOSEC) 20 MG capsule Take 1 capsule (20 mg total) by mouth 2 (two) times daily before a meal.  60 capsule  5  . simvastatin (ZOCOR) 40 MG tablet Take 40   mg by mouth at bedtime.        . zolpidem (AMBIEN) 10 MG tablet Take 10 mg by mouth at bedtime as needed.         No current facility-administered medications for this visit.    Allergies as of 09/02/2012  . (No Known Allergies)    Family History  Problem Relation Age of Onset  . Diabetes Brother     mother  . COPD Father   . Hypertension Mother     History   Social History  . Marital Status: Married    Spouse Name: N/A    Number of Children: 2  . Years of Education: N/A   Occupational History  . retired    Social History Main Topics  . Smoking status: Former Smoker    Quit date: 08/26/2010  . Smokeless tobacco: None  . Alcohol Use: No  . Drug Use: No  . Sexually Active: None   Other Topics Concern  . None   Social History Narrative  . None    Review of Systems: Negative unless mentioned in HPI  Physical Exam: BP 136/71  Pulse 75  Temp(Src) 98.6 F (37 C) (Oral)  Ht 5' 6" (1.676 m)  Wt  267 lb (121.11 kg)  BMI 43.12 kg/m2 General:   Alert and oriented. No distress noted. Pleasant and cooperative.  Head:  Normocephalic and atraumatic. Eyes:  Conjuctiva clear without scleral icterus. Mouth:  Oral mucosa pink and moist. Good dentition. No lesions. Heart:  S1, S2 present without murmurs, rubs, or gallops. Regular rate and rhythm. Abdomen:  +BS, soft, non-tender and non-distended. No rebound or guarding. Mild hepatomegaly Msk:  Symmetrical without gross deformities. Normal posture. Extremities:  Without edema. Neurologic:  Alert and  oriented x4;  grossly normal neurologically. Skin:  Intact without significant lesions or rashes. Psych:  Alert and cooperative. Normal mood and affect.  

## 2012-09-05 NOTE — Assessment & Plan Note (Signed)
62 year old male with history of adenomatous polyps, due for surveillance Aug 2014, presenting without any significant GI complaints. Constipation managed with Linzes 290 mcg daily. Mild hepatomegaly noted with recent US on file; due for LFTs. No issues otherwise.  Proceed with TCS with Dr. Gala Romney in near future: the risks, benefits, and alternatives have been discussed with the patient in detail. The patient states understanding and desires to proceed. Obtain HFP

## 2012-09-07 NOTE — Progress Notes (Signed)
[facility]c PCP 

## 2012-09-10 ENCOUNTER — Encounter (HOSPITAL_COMMUNITY): Payer: Self-pay | Admitting: Pharmacy Technician

## 2012-09-18 ENCOUNTER — Ambulatory Visit (HOSPITAL_COMMUNITY)
Admission: RE | Admit: 2012-09-18 | Discharge: 2012-09-18 | Disposition: A | Discharge status: Home / Self Care | Payer: PRIVATE HEALTH INSURANCE | Source: Ambulatory Visit | Attending: Internal Medicine | Admitting: Internal Medicine

## 2012-09-18 ENCOUNTER — Encounter (HOSPITAL_COMMUNITY): Payer: Self-pay

## 2012-09-18 ENCOUNTER — Encounter (HOSPITAL_COMMUNITY)
Admission: RE | Disposition: A | Discharge status: Home / Self Care | Payer: Self-pay | Source: Ambulatory Visit | Attending: Internal Medicine

## 2012-09-18 DIAGNOSIS — D369 Benign neoplasm, unspecified site: Secondary | ICD-10-CM

## 2012-09-18 DIAGNOSIS — J449 Chronic obstructive pulmonary disease, unspecified: Secondary | ICD-10-CM | POA: Insufficient documentation

## 2012-09-18 DIAGNOSIS — K3184 Gastroparesis: Secondary | ICD-10-CM | POA: Insufficient documentation

## 2012-09-18 DIAGNOSIS — N189 Chronic kidney disease, unspecified: Secondary | ICD-10-CM | POA: Insufficient documentation

## 2012-09-18 DIAGNOSIS — J4489 Other specified chronic obstructive pulmonary disease: Secondary | ICD-10-CM | POA: Insufficient documentation

## 2012-09-18 DIAGNOSIS — M1990 Unspecified osteoarthritis, unspecified site: Secondary | ICD-10-CM | POA: Insufficient documentation

## 2012-09-18 DIAGNOSIS — E119 Type 2 diabetes mellitus without complications: Secondary | ICD-10-CM | POA: Insufficient documentation

## 2012-09-18 DIAGNOSIS — K5900 Constipation, unspecified: Secondary | ICD-10-CM | POA: Insufficient documentation

## 2012-09-18 DIAGNOSIS — Z1211 Encounter for screening for malignant neoplasm of colon: Secondary | ICD-10-CM | POA: Insufficient documentation

## 2012-09-18 DIAGNOSIS — E785 Hyperlipidemia, unspecified: Secondary | ICD-10-CM | POA: Insufficient documentation

## 2012-09-18 DIAGNOSIS — K5730 Diverticulosis of large intestine without perforation or abscess without bleeding: Secondary | ICD-10-CM | POA: Insufficient documentation

## 2012-09-18 DIAGNOSIS — Q438 Other specified congenital malformations of intestine: Secondary | ICD-10-CM | POA: Insufficient documentation

## 2012-09-18 DIAGNOSIS — Z79899 Other long term (current) drug therapy: Secondary | ICD-10-CM | POA: Insufficient documentation

## 2012-09-18 DIAGNOSIS — Z794 Long term (current) use of insulin: Secondary | ICD-10-CM | POA: Insufficient documentation

## 2012-09-18 DIAGNOSIS — M109 Gout, unspecified: Secondary | ICD-10-CM | POA: Insufficient documentation

## 2012-09-18 DIAGNOSIS — K219 Gastro-esophageal reflux disease without esophagitis: Secondary | ICD-10-CM | POA: Insufficient documentation

## 2012-09-18 DIAGNOSIS — Z87891 Personal history of nicotine dependence: Secondary | ICD-10-CM | POA: Insufficient documentation

## 2012-09-18 DIAGNOSIS — I129 Hypertensive chronic kidney disease with stage 1 through stage 4 chronic kidney disease, or unspecified chronic kidney disease: Secondary | ICD-10-CM | POA: Insufficient documentation

## 2012-09-18 DIAGNOSIS — Z86010 Personal history of colonic polyps: Secondary | ICD-10-CM

## 2012-09-18 HISTORY — PX: COLONOSCOPY: SHX5424

## 2012-09-18 SURGERY — COLONOSCOPY
Anesthesia: Moderate Sedation

## 2012-09-18 MED ORDER — SODIUM CHLORIDE 0.9 % IV SOLN
INTRAVENOUS | Status: DC
  Administered 2012-09-18: 11:00:00 via INTRAVENOUS

## 2012-09-18 MED ORDER — MEPERIDINE HCL 100 MG/ML IJ SOLN
INTRAMUSCULAR | Status: DC | PRN
  Administered 2012-09-18: 25 mg via INTRAVENOUS
  Administered 2012-09-18: 50 mg via INTRAVENOUS
  Administered 2012-09-18: 25 mg via INTRAVENOUS

## 2012-09-18 MED ORDER — MEPERIDINE HCL 100 MG/ML IJ SOLN
INTRAMUSCULAR | Status: AC
  Filled 2012-09-18: qty 2

## 2012-09-18 MED ORDER — MIDAZOLAM HCL 5 MG/5ML IJ SOLN
INTRAMUSCULAR | Status: AC
  Filled 2012-09-18: qty 10

## 2012-09-18 MED ORDER — ONDANSETRON HCL 4 MG/2ML IJ SOLN
INTRAMUSCULAR | Status: AC
  Filled 2012-09-18: qty 2

## 2012-09-18 MED ORDER — STERILE WATER FOR IRRIGATION IR SOLN
Status: DC | PRN
  Administered 2012-09-18: 12:00:00

## 2012-09-18 MED ORDER — MIDAZOLAM HCL 5 MG/5ML IJ SOLN
INTRAMUSCULAR | Status: DC | PRN
  Administered 2012-09-18: 1 mg via INTRAVENOUS
  Administered 2012-09-18: 2 mg via INTRAVENOUS
  Administered 2012-09-18: 1 mg via INTRAVENOUS

## 2012-09-18 MED ORDER — ONDANSETRON HCL 4 MG/2ML IJ SOLN
INTRAMUSCULAR | Status: DC | PRN
  Administered 2012-09-18: 4 mg via INTRAVENOUS

## 2012-09-18 NOTE — Interval H&P Note (Signed)
History and Physical Interval Note:  09/18/2012 11:47 AM  Frank Hoover  has presented today for surgery, with the diagnosis of ADENOMATOUS POLYPS  The various methods of treatment have been discussed with the patient and family. After consideration of risks, benefits and other options for treatment, the patient has consented to  Procedure(s) with comments: COLONOSCOPY (N/A) - 11:30 as a surgical intervention .  The patient's history has been reviewed, patient examined, no change in status, stable for surgery.  I have reviewed the patient's chart and labs.  Questions were answered to the patient's satisfaction.      Colonoscopy today. History of colonic adenoma. No change.The risks, benefits, limitations, alternatives and imponderables have been reviewed with the patient. Questions have been answered. All parties are agreeable.   Manus Rudd

## 2012-09-18 NOTE — H&P (View-Only) (Signed)
Referring Provider: Alonza Bogus, MD Primary Care Physician:  Alonza Bogus, MD Primary GI: Dr. Gala Romney   Chief Complaint  Patient presents with  . Follow-up    HPI:   62 year old pleasant male who presents today in follow-up for GERD, gastroparesis, and constipation. Due for colonoscopy  Aug 2014 due to hx of adenomatous polyps. Last EGD Aug 2011: normal.   Last seen by myself in April 2014, doing well with Linzess 290 mcg daily. [place] of abdomen noted mild hepatomegaly; needs updated LFTs. Has BM every morning, but he sometimes has to take Miralax. No abdominal pain. No N/V. No reflux symptoms or dysphagia.    Past Medical History  Diagnosis Date  . Gout   . CRF (chronic renal failure)   . COPD (chronic obstructive pulmonary disease)   . DM (diabetes mellitus)   . GERD (gastroesophageal reflux disease)   . HTN (hypertension)   . Hyperlipemia   . OA (osteoarthritis)   . Gastroparesis 11/01/09  . Adenomatous colon polyp 10/30/09    serrated adenoma removed during Colonoscopy   . Diverticulosis of colon     Past Surgical History  Procedure Laterality Date  . Tonsillectomy    . Cataract extraction      right  . Esophagogastroduodenoscopy  10/30/09    VZC:[id number]   . Colonoscopy  10/30/09    DXA:[id number] diverticulum/serrated adenoma from ICV, next colonoscopy due 10/2012    Current Outpatient Prescriptions  Medication Sig Dispense Refill  . albuterol (PROAIR HFA) 108 (90 BASE) MCG/ACT inhaler Inhale 2 puffs into the lungs every 6 (six) hours as needed.        Marland Kitchen allopurinol (ZYLOPRIM) 300 MG tablet Take 300 mg by mouth daily.        Marland Kitchen amLODipine-benazepril (LOTREL) 10-40 MG per capsule Take 1 capsule by mouth daily.       . cloNIDine (CATAPRES) 0.2 MG tablet Take 0.2 mg by mouth 2 (two) times daily.        . colchicine 0.6 MG tablet Take 0.6 mg by mouth daily.        . furosemide (LASIX) 20 MG tablet Take 20 mg by mouth 2 (two) times daily.        Marland Kitchen gabapentin  (NEURONTIN) 300 MG capsule Take 300 mg by mouth 3 (three) times daily.       Marland Kitchen glimepiride (AMARYL) 2 MG tablet Take 2 mg by mouth daily before breakfast.        . HYDROcodone-acetaminophen (NORCO) 5-325 MG per tablet Take 1 tablet by mouth every 6 (six) hours as needed.        . insulin NPH-insulin regular (NOVOLIN 70/30) (70-30) 100 UNIT/ML injection Inject 7 Units into the skin.        Marland Kitchen insulin regular (HUMULIN R,NOVOLIN R) 100 units/mL injection Inject 18 Units into the skin 3 (three) times daily before meals.       Marland Kitchen LINZESS 290 MCG CAPS TAKE 1 CAPSULE DAILY 30 MINUTES BEFORE BREAKFAST  30 capsule  3  . metoprolol (LOPRESSOR) 100 MG tablet Take 100 mg by mouth 3 (three) times daily.       Marland Kitchen MIRALAX powder DISSOLVE 17 GM SCOOP IN 8 OZ. OF WATER AND DRINK ONCE DAILY AS NEEDEDFOR CONSTIPATION  850 g  11  . omeprazole (PRILOSEC) 20 MG capsule Take 1 capsule (20 mg total) by mouth 2 (two) times daily before a meal.  60 capsule  5  . simvastatin (ZOCOR) 40 MG tablet Take 40  mg by mouth at bedtime.        Marland Kitchen zolpidem (AMBIEN) 10 MG tablet Take 10 mg by mouth at bedtime as needed.         No current facility-administered medications for this visit.    Allergies as of 09/02/2012  . (No Known Allergies)    Family History  Problem Relation Age of Onset  . Diabetes Brother     mother  . COPD Father   . Hypertension Mother     History   Social History  . Marital Status: Married    Spouse Name: N/A    Number of Children: 2  . Years of Education: N/A   Occupational History  . retired    Social History Main Topics  . Smoking status: Former Smoker    Quit date: 08/26/2010  . Smokeless tobacco: None  . Alcohol Use: No  . Drug Use: No  . Sexually Active: None   Other Topics Concern  . None   Social History Narrative  . None    Review of Systems: Negative unless mentioned in HPI  Physical Exam: BP 136/71  Pulse 75  Temp(Src) 98.6 F (37 C) (Oral)  Ht 5' 6"  (1.676 m)  Wt  267 lb (121.11 kg)  BMI 43.12 kg/m2 General:   Alert and oriented. No distress noted. Pleasant and cooperative.  Head:  Normocephalic and atraumatic. Eyes:  Conjuctiva clear without scleral icterus. Mouth:  Oral mucosa pink and moist. Good dentition. No lesions. Heart:  S1, S2 present without murmurs, rubs, or gallops. Regular rate and rhythm. Abdomen:  +BS, soft, non-tender and non-distended. No rebound or guarding. Mild hepatomegaly Msk:  Symmetrical without gross deformities. Normal posture. Extremities:  Without edema. Neurologic:  Alert and  oriented x4;  grossly normal neurologically. Skin:  Intact without significant lesions or rashes. Psych:  Alert and cooperative. Normal mood and affect.

## 2012-09-18 NOTE — Op Note (Signed)
[facility] [address] [place], [address]   COLONOSCOPY PROCEDURE REPORT  PATIENT: [name], [name]  MR#:         [id number] BIRTHDATE: 04/04/1950 , 61  yrs. old GENDER: Male ENDOSCOPIST: R.  Garfield Cornea, MD FACP FACG REFERRED BY:  Sinda Du, M.D. PROCEDURE DATE:  09/18/2012 PROCEDURE:     Colonoscopy surveillance  INDICATIONS: history of colonic adenoma  INFORMED CONSENT:  The risks, benefits, alternatives and imponderables including but not limited to bleeding, perforation as well as the possibility of a missed lesion have been reviewed.  The potential for biopsy, lesion removal, etc. have also been discussed.  Questions have been answered.  All parties agreeable. Please see the history and physical in the medical record for more information.  MEDICATIONS: Versed 4 mg IV and Demerol 100 mg IV in divided doses. Zofran 4 mg IV  DESCRIPTION OF PROCEDURE:  After a digital rectal exam was performed, the [id number]  colonoscope was advanced from the anus through the rectum and colon to the area of the cecum, ileocecal valve and appendiceal orifice.  The cecum was deeply intubated.  These structures were well-seen and photographed for the record.  From the level of the cecum and ileocecal valve, the scope was slowly and cautiously withdrawn.  The mucosal surfaces were carefully surveyed utilizing scope tip deflection to facilitate fold flattening as needed.  The scope was pulled down into the rectum where a thorough examination including retroflexion was performed.    FINDINGS:  Inadequate preparation.  poor prep significantly hampered the examination. All of the colonic mucosa not seen adequately. No gross lesions observed; somewhat elongated, redundant colon. However, a significant lesions could have easily been obscured by the poor prep. Patient states he had a hamburger yesterday which would be conflict with our oral and written  instructions.  THERAPEUTIC / DIAGNOSTIC MANEUVERS PERFORMED: none  COMPLICATIONS: none  CECAL WITHDRAWAL TIME:  6 minutes  IMPRESSION:  Colonic mucosa that was seen appeared normal, however, most of it was not seen due to be very poor prep  RECOMMENDATIONS:   Patient will be offered a repeat colonoscopy within the next 12 months, hopefully, in the setting of an adequate preparation. Discuss with family members.   _______________________________ eSigned:  R. Garfield Cornea, MD FACP [facility] 09/18/2012 12:36 PM   CC:    PATIENT NAME:  Frank Hoover, Frank Hoover MR#: [id number]

## 2012-09-22 ENCOUNTER — Encounter (HOSPITAL_COMMUNITY): Payer: Self-pay | Admitting: Internal Medicine

## 2012-11-21 ENCOUNTER — Other Ambulatory Visit: Payer: Self-pay | Admitting: Gastroenterology

## 2013-02-18 ENCOUNTER — Encounter: Payer: Self-pay | Admitting: Internal Medicine

## 2013-03-03 ENCOUNTER — Encounter (HOSPITAL_COMMUNITY): Payer: Self-pay | Admitting: Emergency Medicine

## 2013-03-03 ENCOUNTER — Emergency Department (HOSPITAL_COMMUNITY): Payer: PRIVATE HEALTH INSURANCE

## 2013-03-03 ENCOUNTER — Emergency Department (HOSPITAL_COMMUNITY)
Admission: EM | Admit: 2013-03-03 | Discharge: 2013-03-04 | Disposition: A | Discharge status: Home / Self Care | Payer: PRIVATE HEALTH INSURANCE | Attending: Emergency Medicine | Admitting: Emergency Medicine

## 2013-03-03 DIAGNOSIS — I129 Hypertensive chronic kidney disease with stage 1 through stage 4 chronic kidney disease, or unspecified chronic kidney disease: Secondary | ICD-10-CM | POA: Diagnosis not present

## 2013-03-03 DIAGNOSIS — N189 Chronic kidney disease, unspecified: Secondary | ICD-10-CM | POA: Diagnosis not present

## 2013-03-03 DIAGNOSIS — M1990 Unspecified osteoarthritis, unspecified site: Secondary | ICD-10-CM | POA: Diagnosis not present

## 2013-03-03 DIAGNOSIS — R739 Hyperglycemia, unspecified: Secondary | ICD-10-CM

## 2013-03-03 DIAGNOSIS — E785 Hyperlipidemia, unspecified: Secondary | ICD-10-CM | POA: Insufficient documentation

## 2013-03-03 DIAGNOSIS — R0789 Other chest pain: Secondary | ICD-10-CM | POA: Diagnosis not present

## 2013-03-03 DIAGNOSIS — Z860100 Personal history of colon polyps, unspecified: Secondary | ICD-10-CM | POA: Insufficient documentation

## 2013-03-03 DIAGNOSIS — Z794 Long term (current) use of insulin: Secondary | ICD-10-CM | POA: Diagnosis not present

## 2013-03-03 DIAGNOSIS — R079 Chest pain, unspecified: Secondary | ICD-10-CM

## 2013-03-03 DIAGNOSIS — K219 Gastro-esophageal reflux disease without esophagitis: Secondary | ICD-10-CM | POA: Insufficient documentation

## 2013-03-03 DIAGNOSIS — M109 Gout, unspecified: Secondary | ICD-10-CM | POA: Insufficient documentation

## 2013-03-03 DIAGNOSIS — E119 Type 2 diabetes mellitus without complications: Secondary | ICD-10-CM | POA: Insufficient documentation

## 2013-03-03 DIAGNOSIS — Z87891 Personal history of nicotine dependence: Secondary | ICD-10-CM | POA: Insufficient documentation

## 2013-03-03 DIAGNOSIS — IMO0002 Reserved for concepts with insufficient information to code with codable children: Secondary | ICD-10-CM

## 2013-03-03 DIAGNOSIS — J449 Chronic obstructive pulmonary disease, unspecified: Secondary | ICD-10-CM | POA: Insufficient documentation

## 2013-03-03 DIAGNOSIS — Z79899 Other long term (current) drug therapy: Secondary | ICD-10-CM | POA: Insufficient documentation

## 2013-03-03 DIAGNOSIS — E1165 Type 2 diabetes mellitus with hyperglycemia: Secondary | ICD-10-CM

## 2013-03-03 DIAGNOSIS — J4489 Other specified chronic obstructive pulmonary disease: Secondary | ICD-10-CM | POA: Insufficient documentation

## 2013-03-03 LAB — COMPREHENSIVE METABOLIC PANEL
ALT: 23 U/L (ref 0–53)
AST: 26 U/L (ref 0–37)
Albumin: 4.7 g/dL (ref 3.5–5.2)
Alkaline Phosphatase: 68 U/L (ref 39–117)
BUN: 44 mg/dL — ABNORMAL HIGH (ref 6–23)
CO2: 29 mEq/L (ref 19–32)
Calcium: 10.4 mg/dL (ref 8.4–10.5)
Chloride: 93 mEq/L — ABNORMAL LOW (ref 96–112)
Creatinine, Ser: 1.94 mg/dL — ABNORMAL HIGH (ref 0.50–1.35)
GFR calc Af Amer: 41 mL/min — ABNORMAL LOW (ref >90)
GFR calc non Af Amer: 36 mL/min — ABNORMAL LOW (ref >90)
Glucose, Bld: 147 mg/dL — ABNORMAL HIGH (ref 70–99)
Potassium: 3.7 mEq/L (ref 3.7–5.3)
Sodium: 137 mEq/L (ref 137–147)
Total Bilirubin: 0.3 mg/dL (ref 0.3–1.2)
Total Protein: 8.3 g/dL (ref 6.0–8.3)

## 2013-03-03 LAB — CBC WITH DIFFERENTIAL/PLATELET
Basophils Absolute: 0 10*3/uL (ref 0.0–0.1)
Basophils Relative: 1 % (ref 0–1)
Eosinophils Absolute: 0.2 10*3/uL (ref 0.0–0.7)
Eosinophils Relative: 5 % (ref 0–5)
HCT: 38.4 % — ABNORMAL LOW (ref 39.0–52.0)
Hemoglobin: 13.5 g/dL (ref 13.0–17.0)
Lymphocytes Relative: 47 % — ABNORMAL HIGH (ref 12–46)
Lymphs Abs: 2 10*3/uL (ref 0.7–4.0)
MCH: 30.3 pg (ref 26.0–34.0)
MCHC: 35.2 g/dL (ref 30.0–36.0)
MCV: 86.3 fL (ref 78.0–100.0)
Monocytes Absolute: 0.3 10*3/uL (ref 0.1–1.0)
Monocytes Relative: 7 % (ref 3–12)
Neutro Abs: 1.8 10*3/uL (ref 1.7–7.7)
Neutrophils Relative %: 41 % — ABNORMAL LOW (ref 43–77)
Platelets: 182 10*3/uL (ref 150–400)
RBC: 4.45 MIL/uL (ref 4.22–5.81)
RDW: 13.6 % (ref 11.5–15.5)
WBC: 4.3 10*3/uL (ref 4.0–10.5)

## 2013-03-03 LAB — TROPONIN I: Troponin I: <0.3 ng/mL (ref <0.30)

## 2013-03-03 LAB — GLUCOSE, CAPILLARY: Glucose-Capillary: 173 mg/dL — ABNORMAL HIGH (ref 70–99)

## 2013-03-03 MED ORDER — ASPIRIN 81 MG PO CHEW
324.0000 mg | CHEWABLE_TABLET | Freq: Once | ORAL | Status: AC
  Administered 2013-03-03: 324 mg via ORAL
  Filled 2013-03-03: qty 4

## 2013-03-03 NOTE — ED Notes (Signed)
Pt  Out of insulin  Since Monday.  Says the drug store could not give it to him due to  Medicare. Says his chest has been hurting also

## 2013-03-03 NOTE — ED Provider Notes (Signed)
CSN: [id number]     Arrival date & time 03/03/13  2002 History   First MD Initiated Contact with Patient 03/03/13 2015     Chief Complaint  Patient presents with  . Hyperglycemia   (Consider location/radiation/quality/duration/timing/severity/associated sxs/prior Treatment) HPI Comments: 62 yo male with DMII, gastroparesis, htn, chol, past smoker presents with hyperglycemia and chest discomfort.  Pt has been out of insulin (unsure dose, varies with sugar) since Monday due to insurance coverage.  His glu has been running in 200s.  PTA pt developed anterior chest pressure that lasted approx 20 min, mild right arm radiation, no hx of similar.  Pt has had a few episodes of brief chest discomfort with exertion.  No CAD or MI hx.  NO diaphoresis.  No blood clot hx, recent surgery, leg pain/ swelling or active CA.  No pain currently.    Patient is a 62 y.o. male presenting with hyperglycemia. The history is provided by the patient.  Hyperglycemia Associated symptoms: chest pain   Associated symptoms: no abdominal pain, no dysuria, no fever, no shortness of breath and no vomiting     Past Medical History  Diagnosis Date  . Gout   . COPD (chronic obstructive pulmonary disease)   . DM (diabetes mellitus)   . GERD (gastroesophageal reflux disease)   . HTN (hypertension)   . Hyperlipemia   . OA (osteoarthritis)   . Gastroparesis 11/01/09  . Adenomatous colon polyp 10/30/09    serrated adenoma removed during Colonoscopy   . Diverticulosis of colon   . CRF (chronic renal failure)    Past Surgical History  Procedure Laterality Date  . Tonsillectomy    . Cataract extraction      right  . Esophagogastroduodenoscopy  10/30/09    [id number]   . Colonoscopy  10/30/09    [id number] diverticulum/serrated adenoma from ICV, next colonoscopy due 10/2012  . Colonoscopy N/A 09/18/2012    Procedure: COLONOSCOPY;  Surgeon: Daneil Dolin, MD;  Location: [facility];  Service: Endoscopy;  Laterality:  N/A;  11:30   Family History  Problem Relation Age of Onset  . Diabetes Brother     mother  . COPD Father   . Hypertension Mother   . Colon cancer Neg Hx    History  Substance Use Topics  . Smoking status: Former Smoker    Quit date: 08/26/2010  . Smokeless tobacco: Not on file  . Alcohol Use: No    Review of Systems  Constitutional: Negative for fever and chills.  HENT: Negative for congestion.   Eyes: Negative for visual disturbance.  Respiratory: Negative for shortness of breath.   Cardiovascular: Positive for chest pain.  Gastrointestinal: Negative for vomiting and abdominal pain.  Genitourinary: Negative for dysuria and flank pain.  Musculoskeletal: Negative for back pain, neck pain and neck stiffness.  Skin: Negative for rash.  Neurological: Negative for light-headedness and headaches.    Allergies  Review of patient's allergies indicates no known allergies.  Home Medications   Current Outpatient Rx  Name  Route  Sig  Dispense  Refill  . albuterol (PROAIR HFA) 108 (90 BASE) MCG/ACT inhaler   Inhalation   Inhale 2 puffs into the lungs every 6 (six) hours as needed for wheezing or shortness of breath.          . allopurinol (ZYLOPRIM) 300 MG tablet   Oral   Take 300 mg by mouth daily.           Marland Kitchen amLODipine-benazepril (LOTREL) 10-40  MG per capsule   Oral   Take 1 capsule by mouth daily.          . cloNIDine (CATAPRES) 0.2 MG tablet   Oral   Take 0.2 mg by mouth 2 (two) times daily.           . colchicine 0.6 MG tablet   Oral   Take 0.6 mg by mouth daily.           . furosemide (LASIX) 20 MG tablet   Oral   Take 20 mg by mouth 2 (two) times daily.           Marland Kitchen gabapentin (NEURONTIN) 300 MG capsule   Oral   Take 300 mg by mouth 3 (three) times daily.          Marland Kitchen glimepiride (AMARYL) 2 MG tablet   Oral   Take 2 mg by mouth daily before breakfast.           . HYDROcodone-acetaminophen (NORCO) 5-325 MG per tablet   Oral   Take 1  tablet by mouth every 6 (six) hours as needed for pain.          Marland Kitchen insulin NPH-insulin regular (NOVOLIN 70/30) (70-30) 100 UNIT/ML injection   Subcutaneous   Inject 7 Units into the skin daily with breakfast.          . insulin regular (HUMULIN R,NOVOLIN R) 100 units/mL injection   Subcutaneous   Inject 18 Units into the skin 3 (three) times daily before meals.          Marland Kitchen LINZESS 290 MCG CAPS capsule      TAKE 1 CAPSULE ONCE DAILY 30 MINUTES BEFORE MEALS   30 capsule   11   . metoprolol (LOPRESSOR) 100 MG tablet   Oral   Take 100 mg by mouth 3 (three) times daily.          Marland Kitchen omeprazole (PRILOSEC) 20 MG capsule   Oral   Take 1 capsule (20 mg total) by mouth 2 (two) times daily before a meal.   60 capsule   5   . polyethylene glycol (MIRALAX / GLYCOLAX) packet   Oral   Take 17 g by mouth daily as needed (Constipation).         . polyethylene glycol-electrolytes (TRILYTE) 420 G solution   Oral   Take 4,000 mLs by mouth as directed.   4000 mL   0   . simvastatin (ZOCOR) 40 MG tablet   Oral   Take 40 mg by mouth at bedtime.           Marland Kitchen zolpidem (AMBIEN) 10 MG tablet   Oral   Take 10 mg by mouth at bedtime as needed for sleep.           BP 175/87  Pulse 71  Temp(Src) 98.2 F (36.8 C) (Oral)  Resp 20  Ht 5\' 6"  (1.676 m)  Wt 257 lb (116.574 kg)  BMI 41.50 kg/m2  SpO2 100% Physical Exam  Nursing note and vitals reviewed. Constitutional: He is oriented to person, place, and time. He appears well-developed and well-nourished.  HENT:  Head: Normocephalic and atraumatic.  Eyes: Conjunctivae are normal. Right eye exhibits no discharge. Left eye exhibits no discharge.  Neck: Normal range of motion. Neck supple. No tracheal deviation present.  Cardiovascular: Normal rate and regular rhythm.   No murmur heard. Pulmonary/Chest: Effort normal and breath sounds normal.  Abdominal: Soft. He exhibits no distension. There is no tenderness.  There is no guarding.   Musculoskeletal: He exhibits no edema and no tenderness.  Neurological: He is alert and oriented to person, place, and time.  Skin: Skin is warm. No rash noted.  Psychiatric: He has a normal mood and affect.    ED Course  Procedures (including critical care time) Labs Review Labs Reviewed  CBC WITH DIFFERENTIAL - Abnormal; Notable for the following:    HCT 38.4 (*)    Neutrophils Relative % 41 (*)    Lymphocytes Relative 47 (*)    All other components within normal limits  COMPREHENSIVE METABOLIC PANEL - Abnormal; Notable for the following:    Chloride 93 (*)    Glucose, Bld 147 (*)    BUN 44 (*)    Creatinine, Ser 1.94 (*)    GFR calc non Af Amer 36 (*)    GFR calc Af Amer 41 (*)    All other components within normal limits  GLUCOSE, CAPILLARY - Abnormal; Notable for the following:    Glucose-Capillary 173 (*)    All other components within normal limits  TROPONIN I  TROPONIN I   Imaging Review Dg Chest Portable 1 View  03/03/2013   CLINICAL DATA:  Hyperglycemia.  COPD.  Diabetes.  EXAM: PORTABLE CHEST - 1 VIEW  COMPARISON:  03/22/2009  FINDINGS: The heart size and mediastinal contours are within normal limits. Both lungs are clear. The visualized skeletal structures are unremarkable.  IMPRESSION: No active disease.   Electronically Signed   By: Sherryl Barters M.D.   On: 03/03/2013 20:46    EKG Interpretation   None     EKG not in MUSE  Date: 03/03/2013  Rate: 69  Rhythm: normal sinus rhythm  QRS Axis: normal  Intervals: PR prolonged  ST/T Wave abnormalities: nonspecific ST changes  Conduction Disutrbances:first-degree A-V block   Narrative Interpretation: No acute findings    MDM   1. Hyperglycemia   2. Chest pain   3. Uncontrolled diabetes mellitus    No sxs on exam. Pt has three risk factors for CAD.  With recent chest pain I recommended observation in hospital For monitoring/ troponins, pt declined. Pt agreed to stay for two troponins and two  ekgs.  ASA in ED.  Patient has capacity to make decisions, understands benefits of hospitalization and risks of going home may result in worsening health condition including MI (heart attack) or death.  Patient refuses hospital placement. Patient understands they may return at any time. Family with patient during discussion. Pt has fup for insulin meds with pcp.  Pt will need social work to assist.   Signed out to fup repeat troponin at 3.5 hrs and admit if patient changes his mind or develops further chest pain.   Acute Chest pain, DM, Hyperglycemia     Mariea Clonts, MD 03/03/13 2129

## 2013-03-03 NOTE — ED Notes (Signed)
Received report on pt, pt alert, able to answer questions, on cardiac monitor with first degree block sinus brady,  denies any pain at present, lab in room to draw second trop level. Pt and family expressed understanding.

## 2013-03-04 DIAGNOSIS — E119 Type 2 diabetes mellitus without complications: Secondary | ICD-10-CM | POA: Diagnosis not present

## 2013-03-04 LAB — TROPONIN I: Troponin I: <0.3 ng/mL (ref <0.30)

## 2013-03-25 ENCOUNTER — Encounter: Payer: Self-pay | Admitting: Gastroenterology

## 2013-03-25 ENCOUNTER — Ambulatory Visit (INDEPENDENT_AMBULATORY_CARE_PROVIDER_SITE_OTHER): Payer: PRIVATE HEALTH INSURANCE | Admitting: Gastroenterology

## 2013-03-25 VITALS — BP 107/65 | HR 65 | Temp 98.4°F | Wt 257.4 lb

## 2013-03-25 DIAGNOSIS — Z86010 Personal history of colonic polyps: Secondary | ICD-10-CM

## 2013-03-25 DIAGNOSIS — K3184 Gastroparesis: Secondary | ICD-10-CM

## 2013-03-25 DIAGNOSIS — K5900 Constipation, unspecified: Secondary | ICD-10-CM

## 2013-03-25 DIAGNOSIS — K219 Gastro-esophageal reflux disease without esophagitis: Secondary | ICD-10-CM

## 2013-03-25 DIAGNOSIS — R160 Hepatomegaly, not elsewhere classified: Secondary | ICD-10-CM

## 2013-03-25 MED ORDER — PEG 3350-KCL-NA BICARB-NACL 420 G PO SOLR: 4000.0000 mL | ORAL | Status: DC

## 2013-03-25 NOTE — Patient Instructions (Signed)
1. Colonoscopy as planned.  See separate instructions. 

## 2013-03-25 NOTE — Assessment & Plan Note (Signed)
Mild hepatomegaly on U/S. LFTs normal in 02/2013. Continue to monitor LFTs with routine labs through PCP.

## 2013-03-25 NOTE — Progress Notes (Signed)
Primary Care Physician:  Alonza Bogus, MD  Primary Gastroenterologist:  Garfield Cornea, MD   Chief Complaint  Patient presents with  . Follow-up    HPI:  Frank Hoover is a 63 y.o. male here for followup. He has a history of GERD, gastroparesis, constipation. Colonoscopy performed back in July 2014 was inadequate due to poor bowel prep. Patient ate a solid food the day before the procedure conflicting with prep instructions. He has been advised to have a repeat colonoscopy within 12 months. He also has a history of mild hepatomegaly. LFTs have been normal.  Doing well. No complaints. No abdominal pain. Heartburn well-controlled. Constipation well-managed with Linzess. No melena, brbpr. No unintentional weight loss.    Current Outpatient Prescriptions  Medication Sig Dispense Refill  . albuterol (PROAIR HFA) 108 (90 BASE) MCG/ACT inhaler Inhale 2 puffs into the lungs every 6 (six) hours as needed for wheezing or shortness of breath.       . allopurinol (ZYLOPRIM) 300 MG tablet Take 300 mg by mouth daily.        Marland Kitchen amLODipine-benazepril (LOTREL) 10-40 MG per capsule Take 1 capsule by mouth daily.       . cloNIDine (CATAPRES) 0.2 MG tablet Take 0.2 mg by mouth 3 (three) times daily.       . colchicine 0.6 MG tablet Take 0.6 mg by mouth daily.        . furosemide (LASIX) 40 MG tablet Take 40 mg by mouth 2 (two) times daily.      Marland Kitchen gabapentin (NEURONTIN) 400 MG capsule Take 800 mg by mouth 2 (two) times daily.      Marland Kitchen glimepiride (AMARYL) 2 MG tablet Take 2 mg by mouth daily before breakfast.        . HUMALOG KWIKPEN 100 UNIT/ML KiwkPen Inject 8 Units into the skin at bedtime.      Marland Kitchen HYDROcodone-acetaminophen (NORCO/VICODIN) 5-325 MG per tablet Take 1 tablet by mouth 4 (four) times daily.      Marland Kitchen LANTUS SOLOSTAR 100 UNIT/ML Solostar Pen Inject 15 Units into the skin every morning.       Marland Kitchen LINZESS 290 MCG CAPS capsule 290 mcg daily.      . metoprolol (LOPRESSOR) 100 MG tablet Take 100 mg by  mouth 2 (two) times daily.       Marland Kitchen omeprazole (PRILOSEC) 20 MG capsule Take 20 mg by mouth daily.      . simvastatin (ZOCOR) 10 MG tablet Take 10 mg by mouth daily.      Marland Kitchen zolpidem (AMBIEN) 10 MG tablet Take 10 mg by mouth at bedtime as needed for sleep.        No current facility-administered medications for this visit.    Allergies as of 03/25/2013  . (No Known Allergies)    Past Medical History  Diagnosis Date  . Gout   . COPD (chronic obstructive pulmonary disease)   . DM (diabetes mellitus)   . GERD (gastroesophageal reflux disease)   . HTN (hypertension)   . Hyperlipemia   . OA (osteoarthritis)   . Gastroparesis 11/01/09  . Adenomatous colon polyp 10/30/09    serrated adenoma removed during Colonoscopy   . Diverticulosis of colon   . CRF (chronic renal failure)     Past Surgical History  Procedure Laterality Date  . Tonsillectomy    . Cataract extraction      right  . Esophagogastroduodenoscopy  10/30/09    [id number]   . Colonoscopy  10/30/09  [id number] diverticulum/serrated adenoma from ICV, next colonoscopy due 10/2012  . Colonoscopy N/A 09/18/2012    [id number] mucosa that was seen appeared normal, however most of it was not seen due to be very poor prep    Family History  Problem Relation Age of Onset  . Diabetes Brother     mother  . COPD Father   . Hypertension Mother   . Colon cancer Neg Hx     History   Social History  . Marital Status: Married    Spouse Name: N/A    Number of Children: 2  . Years of Education: N/A   Occupational History  . retired    Social History Main Topics  . Smoking status: Former Smoker    Quit date: 08/26/2010  . Smokeless tobacco: Not on file  . Alcohol Use: No  . Drug Use: No  . Sexual Activity: Not on file   Other Topics Concern  . Not on file   Social History Narrative  . No narrative on file      ROS:  General: Negative for anorexia, weight loss, fever, chills, fatigue, weakness. Eyes:  Negative for vision changes.  ENT: Negative for hoarseness, difficulty swallowing , nasal congestion. CV: Negative for chest pain, angina, palpitations, dyspnea on exertion, peripheral edema.  Respiratory: Negative for dyspnea at rest, dyspnea on exertion, cough, sputum, wheezing.  GI: See history of present illness. GU:  Negative for dysuria, hematuria, urinary incontinence, urinary frequency, nocturnal urination.  MS: Negative for joint pain, low back pain.  Derm: Negative for rash or itching.  Neuro: Negative for weakness, abnormal sensation, seizure, frequent headaches, memory loss, confusion.  Psych: Negative for anxiety, depression, suicidal ideation, hallucinations.  Endo: Negative for unusual weight change.  Heme: Negative for bruising or bleeding. Allergy: Negative for rash or hives.    Physical Examination:  BP 107/65  Pulse 65  Temp(Src) 98.4 F (36.9 C) (Oral)  Wt 257 lb 6.4 oz (116.756 kg)   General: Well-nourished, well-developed in no acute distress.  Head: Normocephalic, atraumatic.   Eyes: Conjunctiva pink, no icterus. Mouth: Oropharyngeal mucosa moist and pink , no lesions erythema or exudate. Neck: Supple without thyromegaly, masses, or lymphadenopathy.  Lungs: Clear to auscultation bilaterally.  Heart: Regular rate and rhythm, no murmurs rubs or gallops.  Abdomen: Bowel sounds are normal, nontender, nondistended, no hepatosplenomegaly or masses, no abdominal bruits or    hernia , no rebound or guarding.   Rectal: deferred Extremities: No lower extremity edema. No clubbing or deformities.  Neuro: Alert and oriented x 4 , grossly normal neurologically.  Skin: Warm and dry, no rash or jaundice.   Psych: Alert and cooperative, normal mood and affect.  Labs: Lab Results  Component Value Date   WBC 4.3 03/03/2013   HGB 13.5 03/03/2013   HCT 38.4* 03/03/2013   MCV 86.3 03/03/2013   PLT 182 03/03/2013   Lab Results  Component Value Date   CREATININE 1.94*  03/03/2013   BUN 44* 03/03/2013   NA 137 03/03/2013   K 3.7 03/03/2013   CL 93* 03/03/2013   CO2 29 03/03/2013   Lab Results  Component Value Date   ALT 23 03/03/2013   AST 26 03/03/2013   ALKPHOS 68 03/03/2013   BILITOT 0.3 03/03/2013     Imaging Studies: Dg Chest Portable 1 View  03/03/2013   CLINICAL DATA:  Hyperglycemia.  COPD.  Diabetes.  EXAM: PORTABLE CHEST - 1 VIEW  COMPARISON:  03/22/2009  FINDINGS: The heart  size and mediastinal contours are within normal limits. Both lungs are clear. The visualized skeletal structures are unremarkable.  IMPRESSION: No active disease.   Electronically Signed   By: Sherryl Barters M.D.   On: 03/03/2013 20:46

## 2013-03-25 NOTE — Assessment & Plan Note (Signed)
Doing well. Continue gastroparesis diet. 

## 2013-03-25 NOTE — Assessment & Plan Note (Addendum)
Due for repeat surveillance TCS since poor prep back in 10/2012.  Discussed need to adhere to dietary instructions and bowel prep instructions to ensure adequate bowel preparation. Patient voiced understanding. I have discussed the risks, alternatives, benefits with regards to but not limited to the risk of reaction to medication, bleeding, infection, perforation and the patient is agreeable to proceed. Written consent to be obtained.  Continue Linzess for constipation.

## 2013-03-29 NOTE — Progress Notes (Signed)
cc'd to pcp 

## 2013-03-31 ENCOUNTER — Other Ambulatory Visit: Payer: Self-pay | Admitting: Internal Medicine

## 2013-03-31 DIAGNOSIS — Z1211 Encounter for screening for malignant neoplasm of colon: Secondary | ICD-10-CM

## 2013-03-31 DIAGNOSIS — K5900 Constipation, unspecified: Secondary | ICD-10-CM

## 2013-03-31 DIAGNOSIS — Z86010 Personal history of colonic polyps: Principal | ICD-10-CM

## 2013-04-02 ENCOUNTER — Encounter (HOSPITAL_COMMUNITY): Payer: Self-pay | Admitting: Pharmacist

## 2013-04-08 ENCOUNTER — Encounter (HOSPITAL_COMMUNITY): Payer: Self-pay | Admitting: *Deleted

## 2013-04-08 ENCOUNTER — Ambulatory Visit (HOSPITAL_COMMUNITY)
Admission: RE | Admit: 2013-04-08 | Discharge: 2013-04-08 | Disposition: A | Discharge status: Home / Self Care | Payer: PRIVATE HEALTH INSURANCE | Source: Ambulatory Visit | Attending: Internal Medicine | Admitting: Internal Medicine

## 2013-04-08 ENCOUNTER — Encounter (HOSPITAL_COMMUNITY)
Admission: RE | Disposition: A | Discharge status: Home / Self Care | Payer: Self-pay | Source: Ambulatory Visit | Attending: Internal Medicine

## 2013-04-08 DIAGNOSIS — J4489 Other specified chronic obstructive pulmonary disease: Secondary | ICD-10-CM | POA: Insufficient documentation

## 2013-04-08 DIAGNOSIS — Z794 Long term (current) use of insulin: Secondary | ICD-10-CM | POA: Insufficient documentation

## 2013-04-08 DIAGNOSIS — E119 Type 2 diabetes mellitus without complications: Secondary | ICD-10-CM | POA: Insufficient documentation

## 2013-04-08 DIAGNOSIS — K5900 Constipation, unspecified: Secondary | ICD-10-CM

## 2013-04-08 DIAGNOSIS — N189 Chronic kidney disease, unspecified: Secondary | ICD-10-CM | POA: Insufficient documentation

## 2013-04-08 DIAGNOSIS — Z79899 Other long term (current) drug therapy: Secondary | ICD-10-CM | POA: Insufficient documentation

## 2013-04-08 DIAGNOSIS — I129 Hypertensive chronic kidney disease with stage 1 through stage 4 chronic kidney disease, or unspecified chronic kidney disease: Secondary | ICD-10-CM | POA: Insufficient documentation

## 2013-04-08 DIAGNOSIS — Z860100 Personal history of colon polyps, unspecified: Secondary | ICD-10-CM | POA: Insufficient documentation

## 2013-04-08 DIAGNOSIS — D126 Benign neoplasm of colon, unspecified: Secondary | ICD-10-CM

## 2013-04-08 DIAGNOSIS — Z1211 Encounter for screening for malignant neoplasm of colon: Secondary | ICD-10-CM

## 2013-04-08 DIAGNOSIS — Z538 Procedure and treatment not carried out for other reasons: Secondary | ICD-10-CM

## 2013-04-08 DIAGNOSIS — J449 Chronic obstructive pulmonary disease, unspecified: Secondary | ICD-10-CM | POA: Insufficient documentation

## 2013-04-08 HISTORY — PX: COLONOSCOPY: SHX5424

## 2013-04-08 LAB — GLUCOSE, CAPILLARY: Glucose-Capillary: 105 mg/dL — ABNORMAL HIGH (ref 70–99)

## 2013-04-08 SURGERY — COLONOSCOPY
Anesthesia: Moderate Sedation

## 2013-04-08 MED ORDER — ONDANSETRON HCL 4 MG/2ML IJ SOLN
INTRAMUSCULAR | Status: AC
  Filled 2013-04-08: qty 2

## 2013-04-08 MED ORDER — ONDANSETRON HCL 4 MG/2ML IJ SOLN
INTRAMUSCULAR | Status: DC | PRN
  Administered 2013-04-08: 4 mg via INTRAVENOUS

## 2013-04-08 MED ORDER — SODIUM CHLORIDE 0.9 % IV SOLN
INTRAVENOUS | Status: DC
  Administered 2013-04-08: 12:00:00 via INTRAVENOUS

## 2013-04-08 MED ORDER — MIDAZOLAM HCL 5 MG/5ML IJ SOLN
INTRAMUSCULAR | Status: AC
  Filled 2013-04-08: qty 10

## 2013-04-08 MED ORDER — SIMETHICONE 40 MG/0.6ML PO SUSP
ORAL | Status: DC | PRN
  Administered 2013-04-08: 12:00:00

## 2013-04-08 MED ORDER — MIDAZOLAM HCL 5 MG/5ML IJ SOLN
INTRAMUSCULAR | Status: DC | PRN
  Administered 2013-04-08 (×2): 2 mg via INTRAVENOUS
  Administered 2013-04-08: 1 mg via INTRAVENOUS

## 2013-04-08 MED ORDER — MEPERIDINE HCL 100 MG/ML IJ SOLN
INTRAMUSCULAR | Status: DC | PRN
  Administered 2013-04-08 (×3): 25 mg via INTRAVENOUS

## 2013-04-08 MED ORDER — MEPERIDINE HCL 100 MG/ML IJ SOLN
INTRAMUSCULAR | Status: AC
  Filled 2013-04-08: qty 2

## 2013-04-08 NOTE — H&P (View-Only) (Signed)
Primary Care Physician:  Alonza Bogus, MD  Primary Gastroenterologist:  Garfield Cornea, MD   Chief Complaint  Patient presents with  . Follow-up    HPI:  Frank Hoover is a 63 y.o. male here for followup. He has a history of GERD, gastroparesis, constipation. Colonoscopy performed back in July 2014 was inadequate due to poor bowel prep. Patient ate a solid food the day before the procedure conflicting with prep instructions. He has been advised to have a repeat colonoscopy within 12 months. He also has a history of mild hepatomegaly. LFTs have been normal.  Doing well. No complaints. No abdominal pain. Heartburn well-controlled. Constipation well-managed with Linzess. No melena, brbpr. No unintentional weight loss.    Current Outpatient Prescriptions  Medication Sig Dispense Refill  . albuterol (PROAIR HFA) 108 (90 BASE) MCG/ACT inhaler Inhale 2 puffs into the lungs every 6 (six) hours as needed for wheezing or shortness of breath.       . allopurinol (ZYLOPRIM) 300 MG tablet Take 300 mg by mouth daily.        Marland Kitchen amLODipine-benazepril (LOTREL) 10-40 MG per capsule Take 1 capsule by mouth daily.       . cloNIDine (CATAPRES) 0.2 MG tablet Take 0.2 mg by mouth 3 (three) times daily.       . colchicine 0.6 MG tablet Take 0.6 mg by mouth daily.        . furosemide (LASIX) 40 MG tablet Take 40 mg by mouth 2 (two) times daily.      Marland Kitchen gabapentin (NEURONTIN) 400 MG capsule Take 800 mg by mouth 2 (two) times daily.      Marland Kitchen glimepiride (AMARYL) 2 MG tablet Take 2 mg by mouth daily before breakfast.        . HUMALOG KWIKPEN 100 UNIT/ML KiwkPen Inject 8 Units into the skin at bedtime.      Marland Kitchen HYDROcodone-acetaminophen (NORCO/VICODIN) 5-325 MG per tablet Take 1 tablet by mouth 4 (four) times daily.      Marland Kitchen LANTUS SOLOSTAR 100 UNIT/ML Solostar Pen Inject 15 Units into the skin every morning.       Marland Kitchen LINZESS 290 MCG CAPS capsule 290 mcg daily.      . metoprolol (LOPRESSOR) 100 MG tablet Take 100 mg by  mouth 2 (two) times daily.       Marland Kitchen omeprazole (PRILOSEC) 20 MG capsule Take 20 mg by mouth daily.      . simvastatin (ZOCOR) 10 MG tablet Take 10 mg by mouth daily.      Marland Kitchen zolpidem (AMBIEN) 10 MG tablet Take 10 mg by mouth at bedtime as needed for sleep.        No current facility-administered medications for this visit.    Allergies as of 03/25/2013  . (No Known Allergies)    Past Medical History  Diagnosis Date  . Gout   . COPD (chronic obstructive pulmonary disease)   . DM (diabetes mellitus)   . GERD (gastroesophageal reflux disease)   . HTN (hypertension)   . Hyperlipemia   . OA (osteoarthritis)   . Gastroparesis 11/01/09  . Adenomatous colon polyp 10/30/09    serrated adenoma removed during Colonoscopy   . Diverticulosis of colon   . CRF (chronic renal failure)     Past Surgical History  Procedure Laterality Date  . Tonsillectomy    . Cataract extraction      right  . Esophagogastroduodenoscopy  10/30/09    [id number]   . Colonoscopy  10/30/09  [id number] diverticulum/serrated adenoma from ICV, next colonoscopy due 10/2012  . Colonoscopy N/A 09/18/2012    [id number] mucosa that was seen appeared normal, however most of it was not seen due to be very poor prep    Family History  Problem Relation Age of Onset  . Diabetes Brother     mother  . COPD Father   . Hypertension Mother   . Colon cancer Neg Hx     History   Social History  . Marital Status: Married    Spouse Name: N/A    Number of Children: 2  . Years of Education: N/A   Occupational History  . retired    Social History Main Topics  . Smoking status: Former Smoker    Quit date: 08/26/2010  . Smokeless tobacco: Not on file  . Alcohol Use: No  . Drug Use: No  . Sexual Activity: Not on file   Other Topics Concern  . Not on file   Social History Narrative  . No narrative on file      ROS:  General: Negative for anorexia, weight loss, fever, chills, fatigue, weakness. Eyes:  Negative for vision changes.  ENT: Negative for hoarseness, difficulty swallowing , nasal congestion. CV: Negative for chest pain, angina, palpitations, dyspnea on exertion, peripheral edema.  Respiratory: Negative for dyspnea at rest, dyspnea on exertion, cough, sputum, wheezing.  GI: See history of present illness. GU:  Negative for dysuria, hematuria, urinary incontinence, urinary frequency, nocturnal urination.  MS: Negative for joint pain, low back pain.  Derm: Negative for rash or itching.  Neuro: Negative for weakness, abnormal sensation, seizure, frequent headaches, memory loss, confusion.  Psych: Negative for anxiety, depression, suicidal ideation, hallucinations.  Endo: Negative for unusual weight change.  Heme: Negative for bruising or bleeding. Allergy: Negative for rash or hives.    Physical Examination:  BP 107/65  Pulse 65  Temp(Src) 98.4 F (36.9 C) (Oral)  Wt 257 lb 6.4 oz (116.756 kg)   General: Well-nourished, well-developed in no acute distress.  Head: Normocephalic, atraumatic.   Eyes: Conjunctiva pink, no icterus. Mouth: Oropharyngeal mucosa moist and pink , no lesions erythema or exudate. Neck: Supple without thyromegaly, masses, or lymphadenopathy.  Lungs: Clear to auscultation bilaterally.  Heart: Regular rate and rhythm, no murmurs rubs or gallops.  Abdomen: Bowel sounds are normal, nontender, nondistended, no hepatosplenomegaly or masses, no abdominal bruits or    hernia , no rebound or guarding.   Rectal: deferred Extremities: No lower extremity edema. No clubbing or deformities.  Neuro: Alert and oriented x 4 , grossly normal neurologically.  Skin: Warm and dry, no rash or jaundice.   Psych: Alert and cooperative, normal mood and affect.  Labs: Lab Results  Component Value Date   WBC 4.3 03/03/2013   HGB 13.5 03/03/2013   HCT 38.4* 03/03/2013   MCV 86.3 03/03/2013   PLT 182 03/03/2013   Lab Results  Component Value Date   CREATININE 1.94*  03/03/2013   BUN 44* 03/03/2013   NA 137 03/03/2013   K 3.7 03/03/2013   CL 93* 03/03/2013   CO2 29 03/03/2013   Lab Results  Component Value Date   ALT 23 03/03/2013   AST 26 03/03/2013   ALKPHOS 68 03/03/2013   BILITOT 0.3 03/03/2013     Imaging Studies: Dg Chest Portable 1 View  03/03/2013   CLINICAL DATA:  Hyperglycemia.  COPD.  Diabetes.  EXAM: PORTABLE CHEST - 1 VIEW  COMPARISON:  03/22/2009  FINDINGS: The heart  size and mediastinal contours are within normal limits. Both lungs are clear. The visualized skeletal structures are unremarkable.  IMPRESSION: No active disease.   Electronically Signed   By: Sherryl Barters M.D.   On: 03/03/2013 20:46

## 2013-04-08 NOTE — Interval H&P Note (Signed)
History and Physical Interval Note:  04/08/2013 12:14 PM  Frank Hoover  has presented today for surgery, with the diagnosis of HISTORY OF ADENOMATOUS COLON POLYPS, UNSPECIFIED CONSTIPATION  The various methods of treatment have been discussed with the patient and family. After consideration of risks, benefits and other options for treatment, the patient has consented to  Procedure(s) with comments: COLONOSCOPY (N/A) - 1130 as a surgical intervention .  The patient's history has been reviewed, patient examined, no change in status, stable for surgery.  I have reviewed the patient's chart and labs.  Questions were answered to the patient's satisfaction.     No change. Colonoscopy per plan.The risks, benefits, limitations, alternatives and imponderables have been reviewed with the patient. Questions have been answered. All parties are agreeable.   Manus Rudd

## 2013-04-08 NOTE — Op Note (Signed)
[facility] [address] [place], [address]   COLONOSCOPY PROCEDURE REPORT  PATIENT: Frank Hoover, Frank Hoover  MR#:         [id number] BIRTHDATE: [birth year] , 62  yrs. old GENDER: Male ENDOSCOPIST: R.  Garfield Cornea, MD FACP FACG REFERRED BY:  Sinda Du, M.D. PROCEDURE DATE:  04/08/2013 PROCEDURE:     Colonoscopy with snare polypectomy  INDICATIONS: history of colonic adenoma  INFORMED CONSENT:  The risks, benefits, alternatives and imponderables including but not limited to bleeding, perforation as well as the possibility of a missed lesion have been reviewed.  The potential for biopsy, lesion removal, etc. have also been discussed.  Questions have been answered.  All parties agreeable. Please see the history and physical in the medical record for more information.  MEDICATIONS: Versed 7 mg IV and Demerol 100 mg IV in divided doses. Zofran 4 mg IV  DESCRIPTION OF PROCEDURE:  After a digital rectal exam was performed, the EC-[id number]  colonoscope was advanced from the anus through the rectum and colon to the area of the cecum, ileocecal valve and appendiceal orifice.  The cecum was deeply intubated.  These structures wereseen and photographed for the record.  From the level of the cecum and ileocecal valve, the scope was slowly and cautiously withdrawn.  The mucosal surfaces were surveyed utilizing scope tip deflection to facilitate fold flattening as needed.  The scope was pulled down into the rectum where a thorough examination including retroflexion was performed.     FINDINGS:  Preparation again inadequate as far as polyp detection is concerned.    Grossly normal rectum;  long, redundant capacious colon. Quite a bit vegetable matter and liquid stool precluded complete examination of the colon. I did recent cecum.  I found one 4 mm pedunculated polyp in the mid ascending segment; otherwise the colonic mucosa appeared grossly normal but a  significant lesion could easily be missed  THERAPEUTIC / DIAGNOSTIC MANEUVERS PERFORMED:   The above-mentioned polyp was cold snare removed and recovered. COMPLICATIONS:  CECAL WITHDRAWAL TIME:  9 minutes  IMPRESSION:  Colonic polyp-removed as described above. Inadequate preparation.  Inadequate examination  RECOMMENDATIONS:  Followup on pathology report. Further recommendations to follow   _______________________________ eSigned:  R. Garfield Cornea, MD FACP [facility] 04/08/2013 1:15 PM   CC:    PATIENT NAME:  [name], [name] MR#: [id number]

## 2013-04-08 NOTE — Discharge Instructions (Addendum)
Colonoscopy Discharge Instructions  Read the instructions outlined below and refer to this sheet in the next few weeks. These discharge instructions provide you with general information on caring for yourself after you leave the hospital. Your doctor may also give you specific instructions. While your treatment has been planned according to the most current medical practices available, unavoidable complications occasionally occur. If you have any problems or questions after discharge, call Dr. Gala Romney at [phone]. ACTIVITY  You may resume your regular activity, but move at a slower pace for the next 24 hours.   Take frequent rest periods for the next 24 hours.   Walking will help get rid of the air and reduce the bloated feeling in your belly (abdomen).   No driving for 24 hours (because of the medicine (anesthesia) used during the test).    Do not sign any important legal documents or operate any machinery for 24 hours (because of the anesthesia used during the test).  NUTRITION  Drink plenty of fluids.   You may resume your normal diet as instructed by your doctor.   Begin with a light meal and progress to your normal diet. Heavy or fried foods are harder to digest and may make you feel sick to your stomach (nauseated).   Avoid alcoholic beverages for 24 hours or as instructed.  MEDICATIONS  You may resume your normal medications unless your doctor tells you otherwise.  WHAT YOU CAN EXPECT TODAY  Some feelings of bloating in the abdomen.   Passage of more gas than usual.   Spotting of blood in your stool or on the toilet paper.  IF YOU HAD POLYPS REMOVED DURING THE COLONOSCOPY:  No aspirin products for 7 days or as instructed.   No alcohol for 7 days or as instructed.   Eat a soft diet for the next 24 hours.  FINDING OUT THE RESULTS OF YOUR TEST Not all test results are available during your visit. If your test results are not back during the visit, make an appointment  with your caregiver to find out the results. Do not assume everything is normal if you have not heard from your caregiver or the medical facility. It is important for you to follow up on all of your test results.  SEEK IMMEDIATE MEDICAL ATTENTION IF:  You have more than a spotting of blood in your stool.   Your belly is swollen (abdominal distention).   You are nauseated or vomiting.   You have a temperature over 101.   You have abdominal pain or discomfort that is severe or gets worse throughout the day.      Further recommendations to follow pending review of pathology report  Colon Polyps Polyps are lumps of extra tissue growing inside the body. Polyps can grow in the large intestine (colon). Most colon polyps are noncancerous (benign). However, some colon polyps can become cancerous over time. Polyps that are larger than a pea may be harmful. To be safe, caregivers remove and test all polyps. CAUSES  Polyps form when mutations in the genes cause your cells to grow and divide even though no more tissue is needed. RISK FACTORS There are a number of risk factors that can increase your chances of getting colon polyps. They include:  Being older than 50 years.  Family history of colon polyps or colon cancer.  Long-term colon diseases, such as colitis or Crohn disease.  Being overweight.  Smoking.  Being inactive.  Drinking too much alcohol. SYMPTOMS  Most  small polyps do not cause symptoms. If symptoms are present, they may include:  Blood in the stool. The stool may look dark red or black.  Constipation or diarrhea that lasts longer than 1 week. DIAGNOSIS People often do not know they have polyps until their caregiver finds them during a regular checkup. Your caregiver can use 4 tests to check for polyps:  Digital rectal exam. The caregiver wears gloves and feels inside the rectum. This test would find polyps only in the rectum.  Barium enema. The caregiver puts a  liquid called barium into your rectum before taking X-rays of your colon. Barium makes your colon look white. Polyps are dark, so they are easy to see in the X-ray pictures.  Sigmoidoscopy. A thin, flexible tube (sigmoidoscope) is placed into your rectum. The sigmoidoscope has a light and tiny camera in it. The caregiver uses the sigmoidoscope to look at the last third of your colon.  Colonoscopy. This test is like sigmoidoscopy, but the caregiver looks at the entire colon. This is the most common method for finding and removing polyps. TREATMENT  Any polyps will be removed during a sigmoidoscopy or colonoscopy. The polyps are then tested for cancer. PREVENTION  To help lower your risk of getting more colon polyps:  Eat plenty of fruits and vegetables. Avoid eating fatty foods.  Do not smoke.  Avoid drinking alcohol.  Exercise every day.  Lose weight if recommended by your caregiver.  Eat plenty of calcium and folate. Foods that are rich in calcium include milk, cheese, and broccoli. Foods that are rich in folate include chickpeas, kidney beans, and spinach. HOME CARE INSTRUCTIONS Keep all follow-up appointments as directed by your caregiver. You may need periodic exams to check for polyps. SEEK MEDICAL CARE IF: You notice bleeding during a bowel movement. Document Released: 11/15/2003 Document Revised: 05/13/2011 Document Reviewed: 04/30/2011 [facility] Patient Information 2014 [place].

## 2013-04-09 ENCOUNTER — Encounter: Payer: Self-pay | Admitting: Internal Medicine

## 2013-04-12 ENCOUNTER — Telehealth: Payer: Self-pay

## 2013-04-12 NOTE — Telephone Encounter (Signed)
Results Cc to PCP  

## 2013-04-12 NOTE — Telephone Encounter (Signed)
Reminder in epic °

## 2013-04-12 NOTE — Telephone Encounter (Signed)
Letter from: Daneil Dolin  Reason for Letter: Results Review  Send letter to patient.  Send copy of letter with path to referring provider and PCP.  Needs repeat colonoscopy in one year with office visit.  Needs an extra 2 L of GoLYTELY and an extra day on a clear liquid diet Please schedule extra time in endoscopy

## 2013-04-12 NOTE — Telephone Encounter (Signed)
Mailed letter to pt

## 2013-04-13 ENCOUNTER — Encounter (HOSPITAL_COMMUNITY): Payer: Self-pay | Admitting: Internal Medicine

## 2013-04-13 NOTE — Progress Notes (Signed)
REVIEWED.  

## 2013-07-19 ENCOUNTER — Other Ambulatory Visit (HOSPITAL_COMMUNITY): Payer: Self-pay | Admitting: Unknown Physician Specialty

## 2013-07-19 ENCOUNTER — Other Ambulatory Visit (HOSPITAL_COMMUNITY): Payer: Self-pay | Admitting: Pulmonary Disease

## 2013-07-19 ENCOUNTER — Ambulatory Visit (HOSPITAL_COMMUNITY)
Admission: RE | Admit: 2013-07-19 | Discharge: 2013-07-19 | Disposition: A | Discharge status: Home / Self Care | Payer: PRIVATE HEALTH INSURANCE | Source: Ambulatory Visit | Attending: Pulmonary Disease | Admitting: Pulmonary Disease

## 2013-07-19 DIAGNOSIS — E785 Hyperlipidemia, unspecified: Secondary | ICD-10-CM | POA: Insufficient documentation

## 2013-07-19 DIAGNOSIS — I10 Essential (primary) hypertension: Secondary | ICD-10-CM | POA: Insufficient documentation

## 2013-07-19 DIAGNOSIS — I517 Cardiomegaly: Secondary | ICD-10-CM

## 2013-07-19 DIAGNOSIS — E119 Type 2 diabetes mellitus without complications: Secondary | ICD-10-CM | POA: Insufficient documentation

## 2013-07-19 DIAGNOSIS — I509 Heart failure, unspecified: Secondary | ICD-10-CM | POA: Insufficient documentation

## 2013-07-19 NOTE — Progress Notes (Signed)
Echo Lab  2D Echocardiogram completed.  [place], RDCS 07/19/2013 2:42 PM

## 2013-09-29 ENCOUNTER — Encounter: Payer: Self-pay | Admitting: Internal Medicine

## 2013-11-15 ENCOUNTER — Other Ambulatory Visit: Payer: Self-pay | Admitting: Gastroenterology

## 2013-12-02 ENCOUNTER — Encounter (HOSPITAL_COMMUNITY): Payer: Self-pay | Admitting: Emergency Medicine

## 2013-12-02 ENCOUNTER — Inpatient Hospital Stay (HOSPITAL_COMMUNITY)
Admission: EM | Admit: 2013-12-02 | Discharge: 2013-12-05 | DRG: 683 | Disposition: A | Discharge status: Home Health | Payer: PRIVATE HEALTH INSURANCE | Attending: Pulmonary Disease | Admitting: Pulmonary Disease

## 2013-12-02 ENCOUNTER — Emergency Department (HOSPITAL_COMMUNITY): Payer: PRIVATE HEALTH INSURANCE

## 2013-12-02 DIAGNOSIS — J449 Chronic obstructive pulmonary disease, unspecified: Secondary | ICD-10-CM | POA: Diagnosis present

## 2013-12-02 DIAGNOSIS — Z833 Family history of diabetes mellitus: Secondary | ICD-10-CM | POA: Diagnosis not present

## 2013-12-02 DIAGNOSIS — Z9181 History of falling: Secondary | ICD-10-CM

## 2013-12-02 DIAGNOSIS — E785 Hyperlipidemia, unspecified: Secondary | ICD-10-CM | POA: Diagnosis present

## 2013-12-02 DIAGNOSIS — G4730 Sleep apnea, unspecified: Secondary | ICD-10-CM | POA: Diagnosis present

## 2013-12-02 DIAGNOSIS — Z794 Long term (current) use of insulin: Secondary | ICD-10-CM | POA: Diagnosis not present

## 2013-12-02 DIAGNOSIS — M25561 Pain in right knee: Secondary | ICD-10-CM | POA: Diagnosis present

## 2013-12-02 DIAGNOSIS — M1990 Unspecified osteoarthritis, unspecified site: Secondary | ICD-10-CM | POA: Diagnosis present

## 2013-12-02 DIAGNOSIS — E871 Hypo-osmolality and hyponatremia: Secondary | ICD-10-CM | POA: Diagnosis present

## 2013-12-02 DIAGNOSIS — Z79899 Other long term (current) drug therapy: Secondary | ICD-10-CM | POA: Diagnosis not present

## 2013-12-02 DIAGNOSIS — I10 Essential (primary) hypertension: Secondary | ICD-10-CM | POA: Diagnosis present

## 2013-12-02 DIAGNOSIS — E669 Obesity, unspecified: Secondary | ICD-10-CM | POA: Diagnosis present

## 2013-12-02 DIAGNOSIS — E119 Type 2 diabetes mellitus without complications: Secondary | ICD-10-CM | POA: Diagnosis present

## 2013-12-02 DIAGNOSIS — K219 Gastro-esophageal reflux disease without esophagitis: Secondary | ICD-10-CM | POA: Diagnosis present

## 2013-12-02 DIAGNOSIS — K3184 Gastroparesis: Secondary | ICD-10-CM | POA: Diagnosis present

## 2013-12-02 DIAGNOSIS — N179 Acute kidney failure, unspecified: Secondary | ICD-10-CM | POA: Diagnosis present

## 2013-12-02 DIAGNOSIS — N183 Chronic kidney disease, stage 3 unspecified: Secondary | ICD-10-CM

## 2013-12-02 DIAGNOSIS — Z6841 Body Mass Index (BMI) 40.0 and over, adult: Secondary | ICD-10-CM | POA: Diagnosis not present

## 2013-12-02 DIAGNOSIS — I509 Heart failure, unspecified: Secondary | ICD-10-CM | POA: Diagnosis present

## 2013-12-02 DIAGNOSIS — E1122 Type 2 diabetes mellitus with diabetic chronic kidney disease: Secondary | ICD-10-CM

## 2013-12-02 DIAGNOSIS — Z8249 Family history of ischemic heart disease and other diseases of the circulatory system: Secondary | ICD-10-CM

## 2013-12-02 DIAGNOSIS — E860 Dehydration: Secondary | ICD-10-CM | POA: Diagnosis present

## 2013-12-02 DIAGNOSIS — D649 Anemia, unspecified: Secondary | ICD-10-CM | POA: Diagnosis present

## 2013-12-02 DIAGNOSIS — I129 Hypertensive chronic kidney disease with stage 1 through stage 4 chronic kidney disease, or unspecified chronic kidney disease: Secondary | ICD-10-CM | POA: Diagnosis present

## 2013-12-02 DIAGNOSIS — R42 Dizziness and giddiness: Secondary | ICD-10-CM | POA: Diagnosis present

## 2013-12-02 DIAGNOSIS — Z87891 Personal history of nicotine dependence: Secondary | ICD-10-CM

## 2013-12-02 DIAGNOSIS — Z825 Family history of asthma and other chronic lower respiratory diseases: Secondary | ICD-10-CM | POA: Diagnosis not present

## 2013-12-02 DIAGNOSIS — N189 Chronic kidney disease, unspecified: Secondary | ICD-10-CM

## 2013-12-02 LAB — COMPREHENSIVE METABOLIC PANEL
ALT: 28 U/L (ref 0–53)
AST: 31 U/L (ref 0–37)
Albumin: 4.4 g/dL (ref 3.5–5.2)
Alkaline Phosphatase: 89 U/L (ref 39–117)
Anion gap: 18 — ABNORMAL HIGH (ref 5–15)
BUN: 56 mg/dL — ABNORMAL HIGH (ref 6–23)
CO2: 23 mEq/L (ref 19–32)
Calcium: 9.5 mg/dL (ref 8.4–10.5)
Chloride: 92 mEq/L — ABNORMAL LOW (ref 96–112)
Creatinine, Ser: 5.83 mg/dL — ABNORMAL HIGH (ref 0.50–1.35)
GFR calc Af Amer: 11 mL/min — ABNORMAL LOW (ref >90)
GFR calc non Af Amer: 9 mL/min — ABNORMAL LOW (ref >90)
Glucose, Bld: 344 mg/dL — ABNORMAL HIGH (ref 70–99)
Potassium: 4.1 mEq/L (ref 3.7–5.3)
Sodium: 133 mEq/L — ABNORMAL LOW (ref 137–147)
Total Bilirubin: 0.4 mg/dL (ref 0.3–1.2)
Total Protein: 7.6 g/dL (ref 6.0–8.3)

## 2013-12-02 LAB — CBC WITH DIFFERENTIAL/PLATELET
Basophils Absolute: 0 10*3/uL (ref 0.0–0.1)
Basophils Relative: 1 % (ref 0–1)
Eosinophils Absolute: 0.1 10*3/uL (ref 0.0–0.7)
Eosinophils Relative: 2 % (ref 0–5)
HCT: 35.9 % — ABNORMAL LOW (ref 39.0–52.0)
Hemoglobin: 12.6 g/dL — ABNORMAL LOW (ref 13.0–17.0)
Lymphocytes Relative: 36 % (ref 12–46)
Lymphs Abs: 3.1 10*3/uL (ref 0.7–4.0)
MCH: 30.8 pg (ref 26.0–34.0)
MCHC: 35.1 g/dL (ref 30.0–36.0)
MCV: 87.8 fL (ref 78.0–100.0)
Monocytes Absolute: 0.5 10*3/uL (ref 0.1–1.0)
Monocytes Relative: 6 % (ref 3–12)
Neutro Abs: 4.8 10*3/uL (ref 1.7–7.7)
Neutrophils Relative %: 55 % (ref 43–77)
Platelets: 216 10*3/uL (ref 150–400)
RBC: 4.09 MIL/uL — ABNORMAL LOW (ref 4.22–5.81)
RDW: 14.2 % (ref 11.5–15.5)
WBC: 8.6 10*3/uL (ref 4.0–10.5)

## 2013-12-02 LAB — URINE MICROSCOPIC-ADD ON

## 2013-12-02 LAB — URINALYSIS, ROUTINE W REFLEX MICROSCOPIC
Bilirubin Urine: NEGATIVE
Glucose, UA: 250 mg/dL — AB
Ketones, ur: NEGATIVE mg/dL
Nitrite: NEGATIVE
Protein, ur: 30 mg/dL — AB
Specific Gravity, Urine: 1.02 (ref 1.005–1.030)
Urobilinogen, UA: 0.2 mg/dL (ref 0.0–1.0)
pH: 5 (ref 5.0–8.0)

## 2013-12-02 LAB — GLUCOSE, CAPILLARY
Glucose-Capillary: 264 mg/dL — ABNORMAL HIGH (ref 70–99)
Glucose-Capillary: 246 mg/dL — ABNORMAL HIGH (ref 70–99)

## 2013-12-02 LAB — I-STAT CG4 LACTIC ACID, ED: Lactic Acid, Venous: 2.75 mmol/L — ABNORMAL HIGH (ref 0.5–2.2)

## 2013-12-02 LAB — CBG MONITORING, ED: Glucose-Capillary: 357 mg/dL — ABNORMAL HIGH (ref 70–99)

## 2013-12-02 LAB — TROPONIN I: Troponin I: <0.3 ng/mL (ref <0.30)

## 2013-12-02 MED ORDER — COLCHICINE 0.6 MG PO TABS
0.6000 mg | ORAL_TABLET | Freq: Every day | ORAL | Status: DC
  Administered 2013-12-02 – 2013-12-05 (×4): 0.6 mg via ORAL
  Filled 2013-12-02 (×4): qty 1

## 2013-12-02 MED ORDER — SIMVASTATIN 10 MG PO TABS
10.0000 mg | ORAL_TABLET | Freq: Every day | ORAL | Status: DC
  Administered 2013-12-02 – 2013-12-04 (×2): 10 mg via ORAL
  Filled 2013-12-02 (×3): qty 1

## 2013-12-02 MED ORDER — SODIUM CHLORIDE 0.9 % IV BOLUS (SEPSIS)
1000.0000 mL | Freq: Once | INTRAVENOUS | Status: AC
  Administered 2013-12-02: 1000 mL via INTRAVENOUS

## 2013-12-02 MED ORDER — PANTOPRAZOLE SODIUM 40 MG PO TBEC
40.0000 mg | DELAYED_RELEASE_TABLET | Freq: Every day | ORAL | Status: DC
  Administered 2013-12-02 – 2013-12-05 (×4): 40 mg via ORAL
  Filled 2013-12-02 (×4): qty 1

## 2013-12-02 MED ORDER — ONDANSETRON HCL 4 MG PO TABS: 4.0000 mg | ORAL_TABLET | Freq: Four times a day (QID) | ORAL | Status: DC | PRN

## 2013-12-02 MED ORDER — DEXTROSE 5 % IV SOLN
INTRAVENOUS | Status: AC
  Filled 2013-12-02: qty 10

## 2013-12-02 MED ORDER — INSULIN GLARGINE 100 UNIT/ML ~~LOC~~ SOLN
7.0000 [IU] | Freq: Every day | SUBCUTANEOUS | Status: DC
  Administered 2013-12-03 – 2013-12-05 (×3): 7 [IU] via SUBCUTANEOUS
  Filled 2013-12-02 (×4): qty 0.07

## 2013-12-02 MED ORDER — ALUM & MAG HYDROXIDE-SIMETH 200-200-20 MG/5ML PO SUSP: 30.0000 mL | Freq: Four times a day (QID) | ORAL | Status: DC | PRN

## 2013-12-02 MED ORDER — DEXTROSE 5 % IV SOLN
1.0000 g | Freq: Once | INTRAVENOUS | Status: AC
  Administered 2013-12-02: 1 g via INTRAVENOUS
  Filled 2013-12-02: qty 10

## 2013-12-02 MED ORDER — SODIUM CHLORIDE 0.9 % IV SOLN: INTRAVENOUS | Status: DC

## 2013-12-02 MED ORDER — SODIUM CHLORIDE 0.9 % IV SOLN
INTRAVENOUS | Status: DC
  Administered 2013-12-02 – 2013-12-04 (×3): via INTRAVENOUS

## 2013-12-02 MED ORDER — HEPARIN SODIUM (PORCINE) 5000 UNIT/ML IJ SOLN
5000.0000 [IU] | Freq: Three times a day (TID) | INTRAMUSCULAR | Status: DC
  Administered 2013-12-02 – 2013-12-04 (×8): 5000 [IU] via SUBCUTANEOUS
  Filled 2013-12-02 (×7): qty 1

## 2013-12-02 MED ORDER — ALLOPURINOL 300 MG PO TABS
300.0000 mg | ORAL_TABLET | Freq: Every day | ORAL | Status: DC
  Administered 2013-12-02 – 2013-12-05 (×4): 300 mg via ORAL
  Filled 2013-12-02 (×4): qty 1

## 2013-12-02 MED ORDER — INSULIN ASPART 100 UNIT/ML ~~LOC~~ SOLN
0.0000 [IU] | Freq: Three times a day (TID) | SUBCUTANEOUS | Status: DC
  Administered 2013-12-02: 8 [IU] via SUBCUTANEOUS
  Administered 2013-12-03: 5 [IU] via SUBCUTANEOUS
  Administered 2013-12-03: 11 [IU] via SUBCUTANEOUS
  Administered 2013-12-04 (×2): 5 [IU] via SUBCUTANEOUS
  Administered 2013-12-04: 11 [IU] via SUBCUTANEOUS
  Administered 2013-12-05: 5 [IU] via SUBCUTANEOUS
  Administered 2013-12-05: 11 [IU] via SUBCUTANEOUS

## 2013-12-02 MED ORDER — ACETAMINOPHEN 650 MG RE SUPP: 650.0000 mg | Freq: Four times a day (QID) | RECTAL | Status: DC | PRN

## 2013-12-02 MED ORDER — SODIUM CHLORIDE 0.9 % IJ SOLN
3.0000 mL | Freq: Two times a day (BID) | INTRAMUSCULAR | Status: DC
  Administered 2013-12-02 – 2013-12-05 (×3): 3 mL via INTRAVENOUS

## 2013-12-02 MED ORDER — ALBUTEROL SULFATE (2.5 MG/3ML) 0.083% IN NEBU
3.0000 mL | INHALATION_SOLUTION | Freq: Four times a day (QID) | RESPIRATORY_TRACT | Status: DC | PRN
Start: 2013-12-02 — End: 2013-12-05

## 2013-12-02 MED ORDER — ONDANSETRON HCL 4 MG/2ML IJ SOLN: 4.0000 mg | Freq: Four times a day (QID) | INTRAMUSCULAR | Status: DC | PRN

## 2013-12-02 MED ORDER — ACETAMINOPHEN 325 MG PO TABS
650.0000 mg | ORAL_TABLET | Freq: Four times a day (QID) | ORAL | Status: DC | PRN
  Administered 2013-12-02 – 2013-12-04 (×2): 650 mg via ORAL
  Filled 2013-12-02 (×2): qty 2

## 2013-12-02 NOTE — ED Notes (Signed)
Report given to [facility] in Dept 300. All questions answered.

## 2013-12-02 NOTE — ED Notes (Addendum)
Pt c/o dizziness since the other night, falling frequently recently, pt landed on right knee after falling while ambulating to entrance, c/o right knee pain, no facial droop noted and grips equal but weak

## 2013-12-02 NOTE — ED Notes (Signed)
Rancour at bedside, aware of BP. IV bolus started

## 2013-12-02 NOTE — H&P (Signed)
Patient seen, independently examined and chart reviewed. I agree with exam, assessment and plan discussed with Frank Carrel, NP.  63 year old man with some element of chronic kidney disease with last known creatinine 1.94 December 2014 presented several day history of poor oral intake, some diarrhea, generalized abdominal pain and several falls at home. Initial evaluation was notable for acute renal failure and mildly elevated lactate.  He specifically denies any focal weakness, no dysarthria, and no dysphagia.  PMH Includes gout, COPD, diabetes mellitus, hypertension, hyperlipidemia, chronic kidney disease.  Subjective: Currently he feels much better. He has no specific complaints. No abdominal pain.  Objective: Examined in the emergency department. Afebrile, vital signs stable. Instances of hypotension in the emergency department and have been spurious per conversation with EDP.  Gen. Appears calm, comfortable.  Psych. Alert. Speech fluent and clear.  Eyes. Pupils equal, round, reactive to light.  ENT. Grossly unremarkable.  Neck. Grossly unremarkable.  Cardiovascular. Regular rate and rhythm. No murmur, rub or gallop. No lower extremity edema.  Respiratory. Clear to auscultation bilaterally. No wheezes, rales or rhonchi. Normal respiratory effort.   Abdomen. Soft, nontender, nondistended.  Skin. Appears grossly unremarkable.  Musculoskeletal. Moves all extremities well.  Neurologic. Grossly nonfocal.   Creatinine is significantly elevated, 5.83. BUN elevated, 56. Sodium and chloride slightly low. Random glucose 344. Troponin negative. Lactic acid mildly elevated, 2.75.  CBC unremarkable.  CT of the head, chest x-ray and right knee x-rays unremarkable.  EKG sinus rhythm, nonspecific ST changes. First degree AV block.  He is currently awake and alert and appears stable for admission to the medical floor. I suspect his acute renal failure is secondary to poor oral intake  compounded by diuretic and ACE inhibitor. He has no abdominal pain or GI symptoms at this point Plan hydration and laboratory studies in the morning. Hyponatremia should correct with hydration.  Frank Hodgkins, MD [facility] [phone]

## 2013-12-02 NOTE — H&P (Signed)
[facility] History and Physical  Frank Hoover YQM:[id number] DOB: June 23, 1950 DOA: 12/02/2013  Referring physician:  PCP: Alonza Bogus, MD   Chief Complaint: dizziness  HPI: Frank Hoover is a 63 y.o. 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 Initial evaluation reveals acute on chronic renal failure with a creatinine of 5.83 and hyper glycemia with a serum glucose of 344.  Information is obtained from the patient and his wife who is at the bedside. She states that patient complained of generalized weakness and lightheaded dizziness since yesterday particularly when standing. She noted he had difficulty walking and reports a fall yesterday. He denies syncope or hitting his head or loss of consciousness. She indicates she witnessed the fall and that "his legs gave out". He states that both legs just became weak. He denies chest pain palpitations shortness of breath headache visual disturbances numbness or tingling of extremities. Wife reports he has not been eating and drinking his normal amount since yesterday. He denies abdominal pain nausea vomiting diarrhea constipation melena. He denies dysuria hematuria frequency or urgency. He does report he felt chills yesterday but is unsure if he had a fever. He states he checks his CBG 4 times a day and that it has been running "a little bit high".  Workup in the emergency room includes basic metabolic panel significant for sodium of 133, chloride 92, creatinine 5.83, serum glucose 344 and an ion gap 18. He has a complete blood count is unremarkable. Initial troponin negative and lactic acid 2.75. Urinalysis is pending, CT of the head without acute abnormality, chest x-ray is negative, x-ray of right knee reveals degenerative changes no acute fracture.  At the time of my exam he  is afebrile hemodynamically stable and not hypoxic. All in the emergency department he received 1 L of normal saline intravenously.  Review of Systems:  10 point review of systems completed and all systems are negative except as indicated in the history of present illness Past Medical History  Diagnosis Date  . Gout   . COPD (chronic obstructive pulmonary disease)   . DM (diabetes mellitus)   . GERD (gastroesophageal reflux disease)   . HTN (hypertension)   . Hyperlipemia   . OA (osteoarthritis)   . Gastroparesis 11/01/09  . Adenomatous colon polyp 10/30/09    serrated adenoma removed during Colonoscopy   . Diverticulosis of colon   . CRF (chronic renal failure)    Past Surgical History  Procedure Laterality Date  . Tonsillectomy    . Cataract extraction      right  . Esophagogastroduodenoscopy  10/30/09    [id number]   . Colonoscopy  10/30/09    WNU:[id number] diverticulum/serrated adenoma from ICV, next colonoscopy due 10/2012  . Colonoscopy N/A 09/18/2012    IHK:[id number] mucosa that was seen appeared normal, however most of it was not seen due to be very poor prep  . Colonoscopy N/A 04/08/2013    Procedure: COLONOSCOPY;  Surgeon: Daneil Dolin, MD;  Location: [facility];  Service: Endoscopy;  Laterality: N/A;  1130   Social History:  reports that he quit smoking about 3 years ago. He does not have any smokeless tobacco history on file. He reports that he does not drink alcohol or use illicit drugs. She is married and lives with his wife. He is fairly independent with ADLs No Known  Allergies  Family History  Problem Relation Age of Onset  . Diabetes Brother     mother  . COPD Father   . Hypertension Mother   . Colon cancer Neg Hx      Prior to Admission medications   Medication Sig Start Date End Date Taking? Authorizing Provider  acetaminophen (TYLENOL) 500 MG tablet Take 500 mg by mouth every 6 (six) hours as needed for moderate pain.   Yes Historical Provider, MD    albuterol (PROAIR HFA) 108 (90 BASE) MCG/ACT inhaler Inhale 2 puffs into the lungs every 6 (six) hours as needed for wheezing or shortness of breath.    Yes Historical Provider, MD  allopurinol (ZYLOPRIM) 300 MG tablet Take 300 mg by mouth daily.     Yes Historical Provider, MD  amLODipine-benazepril (LOTREL) 10-40 MG per capsule Take 1 capsule by mouth daily.  03/14/12  Yes Historical Provider, MD  cloNIDine (CATAPRES) 0.2 MG tablet Take 0.2 mg by mouth 2 (two) times daily.    Yes Historical Provider, MD  colchicine 0.6 MG tablet Take 0.6 mg by mouth daily.     Yes Historical Provider, MD  furosemide (LASIX) 40 MG tablet Take 40 mg by mouth 2 (two) times daily.   Yes Historical Provider, MD  gabapentin (NEURONTIN) 400 MG capsule Take 400 mg by mouth 2 (two) times daily. Takes at noon & bedtime   Yes Historical Provider, MD  glimepiride (AMARYL) 2 MG tablet Take 2 mg by mouth daily before breakfast.     Yes Historical Provider, MD  HUMALOG KWIKPEN 100 UNIT/ML KiwkPen Inject 8-15 Units into the skin 3 (three) times daily before meals. Per sliding scale 02/06/13  Yes Historical Provider, MD  HYDROcodone-acetaminophen (NORCO/VICODIN) 5-325 MG per tablet Take 1 tablet by mouth every 6 (six) hours as needed for moderate pain.    Yes Historical Provider, MD  lanolin ointment Apply 1 application topically 2 (two) times daily as needed for dry skin.   Yes Historical Provider, MD  LANTUS SOLOSTAR 100 UNIT/ML Solostar Pen Inject 15 Units into the skin every morning.  12/23/12  Yes Historical Provider, MD  LINZESS 290 MCG CAPS capsule TAKE 1 CAPSULE ONCE DAILY 30 MINUTES BEFORE MEALS 11/15/13  Yes Orvil Feil, NP  metoprolol (LOPRESSOR) 100 MG tablet Take 100 mg by mouth 2 (two) times daily.  03/14/12  Yes Historical Provider, MD  omeprazole (PRILOSEC) 20 MG capsule Take 20 mg by mouth daily.   Yes Historical Provider, MD  simvastatin (ZOCOR) 10 MG tablet Take 10 mg by mouth daily.   Yes Historical Provider, MD   zolpidem (AMBIEN) 10 MG tablet Take 10 mg by mouth at bedtime.    Yes Historical Provider, MD   Physical Exam: Filed Vitals:   12/02/13 1315 12/02/13 1320 12/02/13 1415 12/02/13 1422  BP: 105/53 108/55 101/52 106/59  Pulse: 74 75 71 71  Temp:      TempSrc:      Resp: 14 16 14 18   Height:      Weight:      SpO2: 97% 95% 94% 93%    Wt Readings from Last 3 Encounters:  12/02/13 117.935 kg (260 lb)  04/08/13 116.574 kg (257 lb)  04/08/13 116.574 kg (257 lb)    General:  Appears calm somewhat lethargic but easily arouses no acute distress Eyes: PERRL, normal lids, irises & conjunctiva ENT: grossly normal hearing, his membranes of his mouth are pink and slightly dry Neck: no LAD, masses or thyromegaly  Cardiovascular: RRR, no m/r/g. No LE edema. Respiratory:  Normal respiratory effort slightly shallow but with good air movement. Breath sounds are clear bilaterally I hear no wheeze no rhonchi. Abdomen: Obese soft positive bowel sounds throughout nontender to palpation no mass organomegaly noted Skin: no rash or induration seen on limited exam Musculoskeletal: grossly normal tone BUE/BLE, right knee without erythema or swelling mildly tender to palpation decreased range of motion do to pain. Other joints without swelling/erythema Psychiatric: grossly normal mood and affect, speech fluent and appropriate Neurologic: grossly non-focal. Slightly lethargic but easily aroused responds appropriately to questions and commands. His speech is slow but clear           Labs on Admission:  Basic Metabolic Panel:  Recent Labs Lab 12/02/13 1302  NA 133*  K 4.1  CL 92*  CO2 23  GLUCOSE 344*  BUN 56*  CREATININE 5.83*  CALCIUM 9.5   Liver Function Tests:  Recent Labs Lab 12/02/13 1302  AST 31  ALT 28  ALKPHOS 89  BILITOT 0.4  PROT 7.6  ALBUMIN 4.4   No results found for this basename: LIPASE, AMYLASE,  in the last 168 hours No results found for this basename: AMMONIA,  in the  last 168 hours CBC:  Recent Labs Lab 12/02/13 1302  WBC 8.6  NEUTROABS 4.8  HGB 12.6*  HCT 35.9*  MCV 87.8  PLT 216   Cardiac Enzymes:  Recent Labs Lab 12/02/13 1302  TROPONINI <0.30    BNP (last 3 results) No results found for this basename: PROBNP,  in the last 8760 hours CBG:  Recent Labs Lab 12/02/13 1247  GLUCAP 357*    Radiological Exams on Admission: Dg Chest 2 View  12/02/2013   CLINICAL DATA:  Dizziness. Patient status post fall today and yesterday.  EXAM: CHEST  2 VIEW  COMPARISON:  Single view of the chest 03/03/2013.  FINDINGS: Heart size and mediastinal contours are within normal limits. Both lungs are clear. Visualized skeletal structures are unremarkable.  IMPRESSION: Negative exam.   Electronically Signed   By: Inge Rise M.D.   On: 12/02/2013 14:09   Ct Head Wo Contrast  12/02/2013   CLINICAL DATA:  Dizziness.  Recent falls.  EXAM: CT HEAD WITHOUT CONTRAST  TECHNIQUE: Contiguous axial images were obtained from the base of the skull through the vertex without intravenous contrast.  COMPARISON:  None.  FINDINGS: Diffusely enlarged ventricles and subarachnoid spaces. No skull fracture, intracranial hemorrhage, mass lesion, CT evidence of acute infarction or paranasal sinus air-fluid levels.  IMPRESSION: No acute abnormality.  Moderate atrophy.   Electronically Signed   By: Enrique Sack M.D.   On: 12/02/2013 14:16   Dg Knee Complete 4 Views Right  12/02/2013   CLINICAL DATA:  63 year old male with acute onset dizziness and recent falls at the front or this callus. Fell onto right knee with right knee pain. Initial encounter.  EXAM: RIGHT KNEE - COMPLETE 4+ VIEW  COMPARISON:  Right lower extremity venous ultrasound 07/26/2004.  FINDINGS: Severe patellofemoral joint space loss and moderate degenerative spurring. Moderate medial and lateral compartment joint space loss and degenerative spurring. Widespread subchondral sclerosis. Patella intact. Trace if any joint  effusion. No acute fracture or dislocation identified.  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 No acute fracture or dislocation identified.   Electronically Signed   By: Lars Pinks M.D.   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 Likely related to dehydration as well as diuretics and ACE inhibitors. He states he seen Dr.Befakadu just a few weeks ago. Will admit to floor. Will provide continuous IV fluids. Will hold nephrotoxins. Monitor urine output. Will recheck in the morning. If no improvement will consider nephrology consult. Active Problems:  Hyponatremia; mild likely related to #1. IV fluids as indicated above. Will recheck in the morning   DM (diabetes mellitus): Serum glucose 344. Home medications include Lantus 15 units every morning as well as Humalog sliding scale with meals and amaryl. Will hold amaryl I will continue his Lantus at half of his home dose as his by mouth intake is unreliable. I will use sliding scale for optimal control. I will obtain a hemoglobin A1c.    HTN (hypertension): Blood pressure somewhat soft in the emergency department. Will hold his home antihypertensives which include amlodipine, benazepril, Catapres, Lasix, and lopressor. Will hold these for now do to #1 and soft blood pressure. Will monitor closely anticipate resuming Catapres 1 blood pressure indicates.    GERD: stable at baseline. PPI    Obesity BMI 42.1. Nutritional consult    COPD (chronic obstructive pulmonary disease): Appears stable at baseline. Will continue his home inhaler.     Hyperlipemia: Continue statin. Obtain a lipid panel    CRF (chronic renal failure): See #1     Sleep apnea: Likely obstructive secondary to obesity. He uses CPAP at home we will request respiratory consult. Monitor oxygen saturation level    Code Status: full DVT  Prophylaxis: Family Communication: wife at bedside Disposition Plan: home hopefully 24-48 hours  Time spent: 15 minutes  [facility] Pager [phone]

## 2013-12-02 NOTE — ED Provider Notes (Signed)
CSN: [id number]     Arrival date & time 12/02/13  1231 History  This chart was scribed for Frank Essex, MD by Edison Simon, ED Scribe. This patient was seen in room A341/A341-01 and the patient's care was started at 12:56 PM.    Chief Complaint  Patient presents with  . Dizziness   The history is provided by the patient. No language interpreter was used.   HPI Comments: Frank Hoover is a 63 y.o. male who presents to the Emergency Department complaining of generalized weakness and lightheaded dizziness when standing with onset a few days ago. He reports difficulty walking. He states he is not dizzy right now but thinks he will be if he stands. He states he has been sitting in his recliner a lot the past few days. He denies pain except for to his right knee where he fell. Her wife, his speech has been slurred since yesterday and denies focal weakness. He reports history of diabetes, kidney problems, and heart problems though he denies prior MI or cardiac stents. He reports recent change in insulin medication and states he takes Ambien at night. He states his sugar measurements at home have been good. He states he lives with his wife and daughter. He denies chest pain, abdominal pain, appetite change, neck pain, back pain, syncope, fever, cough, blood in stool SOB, vomiting, or diarrhea.  Past Medical History  Diagnosis Date  . Gout   . COPD (chronic obstructive pulmonary disease)   . DM (diabetes mellitus)   . GERD (gastroesophageal reflux disease)   . HTN (hypertension)   . Hyperlipemia   . OA (osteoarthritis)   . Gastroparesis 11/01/09  . Adenomatous colon polyp 10/30/09    serrated adenoma removed during Colonoscopy   . Diverticulosis of colon   . CRF (chronic renal failure)    Past Surgical History  Procedure Laterality Date  . Tonsillectomy    . Cataract extraction      right  . Esophagogastroduodenoscopy  10/30/09    [id number]   . Colonoscopy  10/30/09    [id number]  diverticulum/serrated adenoma from ICV, next colonoscopy due 10/2012  . Colonoscopy N/A 09/18/2012    [id number] mucosa that was seen appeared normal, however most of it was not seen due to be very poor prep  . Colonoscopy N/A 04/08/2013    Procedure: COLONOSCOPY;  Surgeon: Daneil Dolin, MD;  Location: [facility];  Service: Endoscopy;  Laterality: N/A;  1130   Family History  Problem Relation Age of Onset  . Diabetes Brother     mother  . COPD Father   . Hypertension Mother   . Colon cancer Neg Hx    History  Substance Use Topics  . Smoking status: Former Smoker    Quit date: 08/26/2010  . Smokeless tobacco: Not on file  . Alcohol Use: No    Review of Systems  Constitutional: Negative for fever and appetite change.  Respiratory: Negative for cough and shortness of breath.   Cardiovascular: Negative for chest pain.  Gastrointestinal: Negative for vomiting, abdominal pain, diarrhea and blood in stool.  Musculoskeletal: Positive for arthralgias (right knee). Negative for back pain and neck pain.  Neurological: Positive for dizziness, weakness and light-headedness. Negative for syncope.       Slurred speech  All other systems reviewed and are negative. A complete 10 system review of systems was obtained and all systems are negative except as noted in the HPI and PMH.    Allergies  Review of patient's allergies indicates no known allergies.  Home Medications   Prior to Admission medications   Medication Sig Start Date End Date Taking? Authorizing Provider  acetaminophen (TYLENOL) 500 MG tablet Take 500 mg by mouth every 6 (six) hours as needed for moderate pain.   Yes Historical Provider, MD  albuterol (PROAIR HFA) 108 (90 BASE) MCG/ACT inhaler Inhale 2 puffs into the lungs every 6 (six) hours as needed for wheezing or shortness of breath.    Yes Historical Provider, MD  allopurinol (ZYLOPRIM) 300 MG tablet Take 300 mg by mouth daily.     Yes Historical Provider, MD   amLODipine-benazepril (LOTREL) 10-40 MG per capsule Take 1 capsule by mouth daily.  03/14/12  Yes Historical Provider, MD  cloNIDine (CATAPRES) 0.2 MG tablet Take 0.2 mg by mouth 2 (two) times daily.    Yes Historical Provider, MD  colchicine 0.6 MG tablet Take 0.6 mg by mouth daily.     Yes Historical Provider, MD  furosemide (LASIX) 40 MG tablet Take 40 mg by mouth 2 (two) times daily.   Yes Historical Provider, MD  gabapentin (NEURONTIN) 400 MG capsule Take 400 mg by mouth 2 (two) times daily. Takes at noon & bedtime   Yes Historical Provider, MD  glimepiride (AMARYL) 2 MG tablet Take 2 mg by mouth daily before breakfast.     Yes Historical Provider, MD  HUMALOG KWIKPEN 100 UNIT/ML KiwkPen Inject 8-15 Units into the skin 3 (three) times daily before meals. Per sliding scale 02/06/13  Yes Historical Provider, MD  HYDROcodone-acetaminophen (NORCO/VICODIN) 5-325 MG per tablet Take 1 tablet by mouth every 6 (six) hours as needed for moderate pain.    Yes Historical Provider, MD  lanolin ointment Apply 1 application topically 2 (two) times daily as needed for dry skin.   Yes Historical Provider, MD  LANTUS SOLOSTAR 100 UNIT/ML Solostar Pen Inject 15 Units into the skin every morning.  12/23/12  Yes Historical Provider, MD  LINZESS 290 MCG CAPS capsule TAKE 1 CAPSULE ONCE DAILY 30 MINUTES BEFORE MEALS 11/15/13  Yes Orvil Feil, NP  metoprolol (LOPRESSOR) 100 MG tablet Take 100 mg by mouth 2 (two) times daily.  03/14/12  Yes Historical Provider, MD  omeprazole (PRILOSEC) 20 MG capsule Take 20 mg by mouth daily.   Yes Historical Provider, MD  simvastatin (ZOCOR) 10 MG tablet Take 10 mg by mouth daily.   Yes Historical Provider, MD  zolpidem (AMBIEN) 10 MG tablet Take 10 mg by mouth at bedtime.    Yes Historical Provider, MD   BP 106/59  Pulse 71  Temp(Src) 98.3 F (36.8 C) (Oral)  Resp 18  Ht 5\' 6"  (1.676 m)  Wt 260 lb (117.935 kg)  BMI 41.99 kg/m2  SpO2 93% Physical Exam  Nursing note and vitals  reviewed. Constitutional: He is oriented to person, place, and time. He appears well-developed and well-nourished. No distress.  somnolent but arousable  HENT:  Head: Normocephalic and atraumatic.  Mouth/Throat: Oropharynx is clear and moist. No oropharyngeal exudate.  Eyes: Conjunctivae and EOM are normal. Pupils are equal, round, and reactive to light.  Neck: Normal range of motion. Neck supple.  No meningismus. , no c-spine pain  Cardiovascular: Normal rate, regular rhythm, normal heart sounds and intact distal pulses.   No murmur heard. Not tachycardic  Pulmonary/Chest: Effort normal and breath sounds normal. No respiratory distress.  Abdominal: Soft. There is no tenderness. There is no rebound and no guarding.  Genitourinary: Guaiac negative stool.  no gross blood per rectal exam  Musculoskeletal: Normal range of motion. He exhibits no edema and no tenderness.  back nontender, right patella tenderness with reduced ROM, no deformity    Neurological: He is alert and oriented to person, place, and time. No cranial nerve deficit. He exhibits normal muscle tone. Coordination normal.  No ataxia on finger to nose bilaterally. No pronator drift. 5/5 strength throughout. CN 2-12 intact. Equal grip strength. Sensation intact. Gait is not tested.  Skin: Skin is warm.  Psychiatric: He has a normal mood and affect. His behavior is normal.    ED Course  Procedures (including critical care time) Labs Review Labs Reviewed  CBC WITH DIFFERENTIAL - Abnormal; Notable for the following:    RBC 4.09 (*)    Hemoglobin 12.6 (*)    HCT 35.9 (*)    All other components within normal limits  COMPREHENSIVE METABOLIC PANEL - Abnormal; Notable for the following:    Sodium 133 (*)    Chloride 92 (*)    Glucose, Bld 344 (*)    BUN 56 (*)    Creatinine, Ser 5.83 (*)    GFR calc non Af Amer 9 (*)    GFR calc Af Amer 11 (*)    Anion gap 18 (*)    All other components within normal limits  URINALYSIS,  ROUTINE W REFLEX MICROSCOPIC - Abnormal; Notable for the following:    APPearance HAZY (*)    Glucose, UA 250 (*)    Hgb urine dipstick TRACE (*)    Protein, ur 30 (*)    Leukocytes, UA MODERATE (*)    All other components within normal limits  GLUCOSE, CAPILLARY - Abnormal; Notable for the following:    Glucose-Capillary 264 (*)    All other components within normal limits  URINE MICROSCOPIC-ADD ON - Abnormal; Notable for the following:    Squamous Epithelial / LPF FEW (*)    Bacteria, UA MANY (*)    All other components within normal limits  CBG MONITORING, ED - Abnormal; Notable for the following:    Glucose-Capillary 357 (*)    All other components within normal limits  I-STAT CG4 LACTIC ACID, ED - Abnormal; Notable for the following:    Lactic Acid, Venous 2.75 (*)    All other components within normal limits  TROPONIN I  HEMOGLOBIN [id number]  BASIC METABOLIC PANEL  CBC  POC OCCULT BLOOD, ED    Imaging Review Dg Chest 2 View  12/02/2013   CLINICAL DATA:  Dizziness. Patient status post fall today and yesterday.  EXAM: CHEST  2 VIEW  COMPARISON:  Single view of the chest 03/03/2013.  FINDINGS: Heart size and mediastinal contours are within normal limits. Both lungs are clear. Visualized skeletal structures are unremarkable.  IMPRESSION: Negative exam.   Electronically Signed   By: Inge Rise M.D.   On: 12/02/2013 14:09   Ct Head Wo Contrast  12/02/2013   CLINICAL DATA:  Dizziness.  Recent falls.  EXAM: CT HEAD WITHOUT CONTRAST  TECHNIQUE: Contiguous axial images were obtained from the base of the skull through the vertex without intravenous contrast.  COMPARISON:  None.  FINDINGS: Diffusely enlarged ventricles and subarachnoid spaces. No skull fracture, intracranial hemorrhage, mass lesion, CT evidence of acute infarction or paranasal sinus air-fluid levels.  IMPRESSION: No acute abnormality.  Moderate atrophy.   Electronically Signed   By: Enrique Sack M.D.   On: 12/02/2013 14:16    Dg Knee Complete 4 Views Right  12/02/2013  CLINICAL DATA:  63 year old male with acute onset dizziness and recent falls at the front or this callus. Fell onto right knee with right knee pain. Initial encounter.  EXAM: RIGHT KNEE - COMPLETE 4+ VIEW  COMPARISON:  Right lower extremity venous ultrasound 07/26/2004.  FINDINGS: Severe patellofemoral joint space loss and moderate degenerative spurring. Moderate medial and lateral compartment joint space loss and degenerative spurring. Widespread subchondral sclerosis. Patella intact. Trace if any joint effusion. No acute fracture or dislocation identified.  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 No acute fracture or dislocation identified.   Electronically Signed   By: Lars Pinks M.D.   On: 12/02/2013 14:11     EKG Interpretation None     DIAGNOSTIC STUDIES: Oxygen Saturation is 95% on room air, adequate by my interpretation.    COORDINATION OF CARE: 1:02 PM Discussed treatment plan with patient at beside, the patient agrees with the plan and has no further questions at this time.    MDM   Final diagnoses:  Acute renal failure, unspecified acute renal failure type   3 days of generalized weakness with multiple falls and right knee injury. Did not hit head or lose consciousness. No focal weakness. No syncope.  Lactate 2.75, no fever.  BP 84 systolic initially.  Improved to 100s after IV fluids x1 liter.  HR 70-80s. CT head and CXR negative. AKI with Cr 5.8, baseline around 2. +UTI. Troponin negative.  Continue IVF, rocephin.  D/w Dr. Sarajane Jews who will admit.    Date: 12/02/2013  Rate: 81  Rhythm: normal sinus rhythm  QRS Axis: normal  Intervals: normal  ST/T Wave abnormalities: nonspecific ST/T changes  Conduction Disutrbances:none  Narrative Interpretation:   Old EKG Reviewed: new lateral T wave inversions   CRITICAL CARE Performed by: Frank Hoover Total critical care time:  30 Critical care time was exclusive of separately billable procedures and treating other patients. Critical care was necessary to treat or prevent imminent or life-threatening deterioration. Critical care was time spent personally by me on the following activities: development of treatment plan with patient and/or surrogate as well as nursing, discussions with consultants, evaluation of patient's response to treatment, examination of patient, obtaining history from patient or surrogate, ordering and performing treatments and interventions, ordering and review of laboratory studies, ordering and review of radiographic studies, pulse oximetry and re-evaluation of patient's condition.    I personally performed the services described in this documentation, which was scribed in my presence. The recorded information has been reviewed and is accurate.      Frank Essex, MD 12/02/13 1840

## 2013-12-03 LAB — GLUCOSE, CAPILLARY
Glucose-Capillary: 202 mg/dL — ABNORMAL HIGH (ref 70–99)
Glucose-Capillary: 303 mg/dL — ABNORMAL HIGH (ref 70–99)
Glucose-Capillary: 232 mg/dL — ABNORMAL HIGH (ref 70–99)

## 2013-12-03 LAB — CBC
HCT: 31.6 % — ABNORMAL LOW (ref 39.0–52.0)
Hemoglobin: 11.5 g/dL — ABNORMAL LOW (ref 13.0–17.0)
MCH: 31.1 pg (ref 26.0–34.0)
MCHC: 36.4 g/dL — ABNORMAL HIGH (ref 30.0–36.0)
MCV: 85.4 fL (ref 78.0–100.0)
Platelets: 159 10*3/uL (ref 150–400)
RBC: 3.7 MIL/uL — ABNORMAL LOW (ref 4.22–5.81)
RDW: 13.6 % (ref 11.5–15.5)
WBC: 4.6 10*3/uL (ref 4.0–10.5)

## 2013-12-03 LAB — BASIC METABOLIC PANEL
Anion gap: 12 (ref 5–15)
BUN: 53 mg/dL — ABNORMAL HIGH (ref 6–23)
CO2: 26 mEq/L (ref 19–32)
Calcium: 8.7 mg/dL (ref 8.4–10.5)
Chloride: 100 mEq/L (ref 96–112)
Creatinine, Ser: 3.29 mg/dL — ABNORMAL HIGH (ref 0.50–1.35)
GFR calc Af Amer: 22 mL/min — ABNORMAL LOW (ref >90)
GFR calc non Af Amer: 19 mL/min — ABNORMAL LOW (ref >90)
Glucose, Bld: 214 mg/dL — ABNORMAL HIGH (ref 70–99)
Potassium: 3.9 mEq/L (ref 3.7–5.3)
Sodium: 138 mEq/L (ref 137–147)

## 2013-12-03 LAB — HEMOGLOBIN A1C
Hgb A1c MFr Bld: 6.7 % — ABNORMAL HIGH (ref <5.7)
Mean Plasma Glucose: 146 mg/dL — ABNORMAL HIGH (ref <117)

## 2013-12-03 NOTE — Progress Notes (Signed)
Utilization review Completed Acea Yagi RN BSN   

## 2013-12-03 NOTE — Progress Notes (Signed)
Brought patient information on Advance Directives. He stated he did not to complete them.

## 2013-12-03 NOTE — Consult Note (Signed)
Reason for Consult: Acute kidney injury superimposed on chronic Referring Physician: Dr. Durenda Hurt is an 63 y.o. male.  HPI: He is a patient who has history of diabetes, hypertension, sleep apnea and chronic renal failure stage III presently came to the emergency room with complaints of dizziness, lightheadedness and falling down for the last 2 days. Patient states that he was feeling very weak and tired. When he tried to get out of the chair felt dizzy and fell down yesterday. Patient denies any loss of consciousness. When he was evaluated in emergency room patient was found to be hypertensive with acute kidney injury hence admitted to the hospital. Presently patient says that he's feeling much better. He denies any difficulty in breathing and no leg swelling.   Past Medical History  Diagnosis Date  . Gout   . COPD (chronic obstructive pulmonary disease)   . DM (diabetes mellitus)   . GERD (gastroesophageal reflux disease)   . HTN (hypertension)   . Hyperlipemia   . OA (osteoarthritis)   . Gastroparesis 11/01/09  . Adenomatous colon polyp 10/30/09    serrated adenoma removed during Colonoscopy   . Diverticulosis of colon   . CRF (chronic renal failure)     Past Surgical History  Procedure Laterality Date  . Tonsillectomy    . Cataract extraction      right  . Esophagogastroduodenoscopy  10/30/09    JSE:GBTDVV   . Colonoscopy  10/30/09    OHY:WVPX-TGGYI diverticulum/serrated adenoma from ICV, next colonoscopy due 10/2012  . Colonoscopy N/A 09/18/2012    RSW:NIOEVOJ mucosa that was seen appeared normal, however most of it was not seen due to be very poor prep  . Colonoscopy N/A 04/08/2013    Procedure: COLONOSCOPY;  Surgeon: Daneil Dolin, MD;  Location: [facility];  Service: Endoscopy;  Laterality: N/A;  1130    Family History  Problem Relation Age of Onset  . Diabetes Brother     mother  . COPD Father   . Hypertension Mother   . Colon cancer Neg Hx      Social History:  reports that he quit smoking about 3 years ago. He does not have any smokeless tobacco history on file. He reports that he does not drink alcohol or use illicit drugs.  Allergies: No Known Allergies  Medications: I have reviewed the patient's current medications.  Results for orders placed during the hospital encounter of 12/02/13 (from the past 48 hour(s))  CBG MONITORING, ED     Status: Abnormal   Collection Time    12/02/13 12:47 PM      Result Value Ref Range   Glucose-Capillary 357 (*) 70 - 99 mg/dL   Comment 1 Notify RN     Comment 2 Documented in Chart    CBC WITH DIFFERENTIAL     Status: Abnormal   Collection Time    12/02/13  1:02 PM      Result Value Ref Range   WBC 8.6  4.0 - 10.5 K/uL   RBC 4.09 (*) 4.22 - 5.81 MIL/uL   Hemoglobin 12.6 (*) 13.0 - 17.0 g/dL   HCT 35.9 (*) 39.0 - 52.0 %   MCV 87.8  78.0 - 100.0 fL   MCH 30.8  26.0 - 34.0 pg   MCHC 35.1  30.0 - 36.0 g/dL   RDW 14.2  11.5 - 15.5 %   Platelets 216  150 - 400 K/uL   Neutrophils Relative % 55  43 -  77 %   Neutro Abs 4.8  1.7 - 7.7 K/uL   Lymphocytes Relative 36  12 - 46 %   Lymphs Abs 3.1  0.7 - 4.0 K/uL   Monocytes Relative 6  3 - 12 %   Monocytes Absolute 0.5  0.1 - 1.0 K/uL   Eosinophils Relative 2  0 - 5 %   Eosinophils Absolute 0.1  0.0 - 0.7 K/uL   Basophils Relative 1  0 - 1 %   Basophils Absolute 0.0  0.0 - 0.1 K/uL  COMPREHENSIVE METABOLIC PANEL     Status: Abnormal   Collection Time    12/02/13  1:02 PM      Result Value Ref Range   Sodium 133 (*) 137 - 147 mEq/L   Potassium 4.1  3.7 - 5.3 mEq/L   Chloride 92 (*) 96 - 112 mEq/L   CO2 23  19 - 32 mEq/L   Glucose, Bld 344 (*) 70 - 99 mg/dL   BUN 56 (*) 6 - 23 mg/dL   Creatinine, Ser 5.83 (*) 0.50 - 1.35 mg/dL   Calcium 9.5  8.4 - 10.5 mg/dL   Total Protein 7.6  6.0 - 8.3 g/dL   Albumin 4.4  3.5 - 5.2 g/dL   AST 31  0 - 37 U/L   ALT 28  0 - 53 U/L   Alkaline Phosphatase 89  39 - 117 U/L   Total Bilirubin 0.4   0.3 - 1.2 mg/dL   GFR calc non Af Amer 9 (*) >90 mL/min   GFR calc Af Amer 11 (*) >90 mL/min   Comment: (NOTE)     The eGFR has been calculated using the CKD EPI equation.     This calculation has not been validated in all clinical situations.     eGFR's persistently <90 mL/min signify possible Chronic Kidney     Disease.   Anion gap 18 (*) 5 - 15  TROPONIN I     Status: None   Collection Time    12/02/13  1:02 PM      Result Value Ref Range   Troponin I <0.30  <0.30 ng/mL   Comment:            Due to the release kinetics of cTnI,     a negative result within the first hours     of the onset of symptoms does not rule out     myocardial infarction with certainty.     If myocardial infarction is still suspected,     repeat the test at appropriate intervals.  HEMOGLOBIN A1C     Status: Abnormal   Collection Time    12/02/13  1:02 PM      Result Value Ref Range   Hemoglobin A1C 6.7 (*) <5.7 %   Comment: (NOTE)                                                                               According to the ADA Clinical Practice Recommendations for 2011, when     HbA1c is used as a screening test:      >=6.5%   Diagnostic of Diabetes Mellitus               (  if abnormal result is confirmed)     5.7-6.4%   Increased risk of developing Diabetes Mellitus     References:Diagnosis and Classification of Diabetes Mellitus,Diabetes     SJGG,8366,29(UTMLY 1):S62-S69 and Standards of Medical Care in             Diabetes - 2011,Diabetes YTKP,5465,68 (Suppl 1):S11-S61.   Mean Plasma Glucose 146 (*) <117 mg/dL   Comment: Performed at [facility], ED     Status: Abnormal   Collection Time    12/02/13  1:24 PM      Result Value Ref Range   Lactic Acid, Venous 2.75 (*) 0.5 - 2.2 mmol/L  GLUCOSE, CAPILLARY     Status: Abnormal   Collection Time    12/02/13  4:59 PM      Result Value Ref Range   Glucose-Capillary 264 (*) 70 - 99 mg/dL   Comment 1 Notify RN      Comment 2 Documented in Chart    URINALYSIS, ROUTINE W REFLEX MICROSCOPIC     Status: Abnormal   Collection Time    12/02/13  5:39 PM      Result Value Ref Range   Color, Urine YELLOW  YELLOW   APPearance HAZY (*) CLEAR   Specific Gravity, Urine 1.020  1.005 - 1.030   pH 5.0  5.0 - 8.0   Glucose, UA 250 (*) NEGATIVE mg/dL   Hgb urine dipstick TRACE (*) NEGATIVE   Bilirubin Urine NEGATIVE  NEGATIVE   Ketones, ur NEGATIVE  NEGATIVE mg/dL   Protein, ur 30 (*) NEGATIVE mg/dL   Urobilinogen, UA 0.2  0.0 - 1.0 mg/dL   Nitrite NEGATIVE  NEGATIVE   Leukocytes, UA MODERATE (*) NEGATIVE  URINE MICROSCOPIC-ADD ON     Status: Abnormal   Collection Time    12/02/13  5:39 PM      Result Value Ref Range   Squamous Epithelial / LPF FEW (*) RARE   WBC, UA TOO NUMEROUS TO COUNT  <3 WBC/hpf   RBC / HPF 0-2  <3 RBC/hpf   Bacteria, UA MANY (*) RARE  GLUCOSE, CAPILLARY     Status: Abnormal   Collection Time    12/02/13 10:59 PM      Result Value Ref Range   Glucose-Capillary 246 (*) 70 - 99 mg/dL   Comment 1 Documented in Chart     Comment 2 Notify RN    BASIC METABOLIC PANEL     Status: Abnormal   Collection Time    12/03/13  6:09 AM      Result Value Ref Range   Sodium 138  137 - 147 mEq/L   Potassium 3.9  3.7 - 5.3 mEq/L   Chloride 100  96 - 112 mEq/L   Comment: DELTA CHECK NOTED   CO2 26  19 - 32 mEq/L   Glucose, Bld 214 (*) 70 - 99 mg/dL   BUN 53 (*) 6 - 23 mg/dL   Creatinine, Ser 3.29 (*) 0.50 - 1.35 mg/dL   Calcium 8.7  8.4 - 10.5 mg/dL   GFR calc non Af Amer 19 (*) >90 mL/min   GFR calc Af Amer 22 (*) >90 mL/min   Comment: (NOTE)     The eGFR has been calculated using the CKD EPI equation.     This calculation has not been validated in all clinical situations.     eGFR's persistently <90 mL/min signify possible Chronic Kidney     Disease.  Anion gap 12  5 - 15  CBC     Status: Abnormal   Collection Time    12/03/13  6:09 AM      Result Value Ref Range   WBC 4.6  4.0 - 10.5  K/uL   RBC 3.70 (*) 4.22 - 5.81 MIL/uL   Hemoglobin 11.5 (*) 13.0 - 17.0 g/dL   HCT 31.6 (*) 39.0 - 52.0 %   MCV 85.4  78.0 - 100.0 fL   MCH 31.1  26.0 - 34.0 pg   MCHC 36.4 (*) 30.0 - 36.0 g/dL   RDW 13.6  11.5 - 15.5 %   Platelets 159  150 - 400 K/uL   Comment: DELTA CHECK NOTED  GLUCOSE, CAPILLARY     Status: Abnormal   Collection Time    12/03/13  7:28 AM      Result Value Ref Range   Glucose-Capillary 202 (*) 70 - 99 mg/dL   Comment 1 Notify RN      Dg Chest 2 View  12/02/2013   CLINICAL DATA:  Dizziness. Patient status post fall today and yesterday.  EXAM: CHEST  2 VIEW  COMPARISON:  Single view of the chest 03/03/2013.  FINDINGS: Heart size and mediastinal contours are within normal limits. Both lungs are clear. Visualized skeletal structures are unremarkable.  IMPRESSION: Negative exam.   Electronically Signed   By: Inge Rise M.D.   On: 12/02/2013 14:09   Ct Head Wo Contrast  12/02/2013   CLINICAL DATA:  Dizziness.  Recent falls.  EXAM: CT HEAD WITHOUT CONTRAST  TECHNIQUE: Contiguous axial images were obtained from the base of the skull through the vertex without intravenous contrast.  COMPARISON:  None.  FINDINGS: Diffusely enlarged ventricles and subarachnoid spaces. No skull fracture, intracranial hemorrhage, mass lesion, CT evidence of acute infarction or paranasal sinus air-fluid levels.  IMPRESSION: No acute abnormality.  Moderate atrophy.   Electronically Signed   By: Enrique Sack M.D.   On: 12/02/2013 14:16   Dg Knee Complete 4 Views Right  12/02/2013   CLINICAL DATA:  63 year old male with acute onset dizziness and recent falls at the front or this callus. Fell onto right knee with right knee pain. Initial encounter.  EXAM: RIGHT KNEE - COMPLETE 4+ VIEW  COMPARISON:  Right lower extremity venous ultrasound 07/26/2004.  FINDINGS: Severe patellofemoral joint space loss and moderate degenerative spurring. Moderate medial and lateral compartment joint space loss and  degenerative spurring. Widespread subchondral sclerosis. Patella intact. Trace if any joint effusion. No acute fracture or dislocation identified.  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 No acute fracture or dislocation identified.   Electronically Signed   By: Lars Pinks M.D.   On: 12/02/2013 14:11    Review of Systems  Respiratory: Negative for shortness of breath.   Cardiovascular: Negative for orthopnea and leg swelling.  Gastrointestinal: Positive for constipation. Negative for nausea and vomiting.  Musculoskeletal: Positive for falls.  Neurological: Positive for dizziness and weakness.   Blood pressure 107/54, pulse 61, temperature 97 F (36.1 C), temperature source Oral, resp. rate 18, height '5\' 6"'  (1.676 m), weight 123.061 kg (271 lb 4.8 oz), SpO2 98.00%. Physical Exam  Constitutional: He is oriented to person, place, and time. No distress.  Eyes: No scleral icterus.  Neck: No JVD present.  Cardiovascular: Normal rate and regular rhythm.   Respiratory: He has no wheezes. He has no rales.  GI: He exhibits distension. There is no tenderness.  Musculoskeletal: He exhibits  no edema.  Neurological: He is alert and oriented to person, place, and time.    Assessment/Plan: Problem #1 acute kidney injury: Most likely prerenal/ATN. Presently patient is none oliguric and his renal function seems to be improving. Problem #2 history of chronic renal failure: His creatinine was 1.94 with EGFR of port 1 cc per minute on 03/03/2013. The etiology for his renal failure was thought to be secondary to diabetes/hypertension/obesity related nephropathy. Problem #3 history of diabetes, his blood sugar is poorly controlled. Problem #4 hypotension: Most likely from dehydration from using diuretics. Problem #5 history of sleep apnea Problem #6 history of GERD Problem #7 anemia: Mild. Plan: We'll continue his hydration. We'll check his basic metabolic panel,  phosphorus in the morning.  Kathyleen Radice S 12/03/2013, 11:19 AM

## 2013-12-03 NOTE — Care Management Note (Unsigned)
    Page 1 of 1   12/03/2013     5:31:18 PM CARE MANAGEMENT NOTE 12/03/2013  Patient:  Frank Hoover, Frank Hoover   Account Number:  [id number]  Date Initiated:  12/03/2013  Documentation initiated by:  Vladimir Creeks  Subjective/Objective Assessment:   Admitted with ARF, N, V,& Diarrhea. Pt is from home with spouse, and other family. He plans to return home at D/C. He has an aide in the home from [place] for 2 hrs daily     Action/Plan:   May need [place] to follow at D/C- will continue to follow   Anticipated DC Date:  12/05/2013   Anticipated DC Plan:  [place]  CM consult      Choice offered to / List presented to:  C-1 Patient        [place] arranged  HH-1 RN      [place].   Status of service:  In process, will continue to follow Medicare Important Message given?  YES (If response is "NO", the following Medicare IM given date fields will be blank) Date Medicare IM given:  12/03/2013 Medicare IM given by:  Vladimir Creeks Date Additional Medicare IM given:   Additional Medicare IM given by:    Discharge Disposition:    Per UR Regulation:  Reviewed for med. necessity/level of care/duration of stay  If discussed at [place] of Stay Meetings, dates discussed:    Comments:  12/03/13 [place] RN/CM

## 2013-12-03 NOTE — Progress Notes (Signed)
Subjective: He was admitted yesterday with acute on chronic renal failure. This seems to be related to poor oral intake abdominal pain and diarrhea. He says he feels much better this morning. He has no new complaints.  Objective: Vital signs in last 24 hours: Temp:  [97 F (36.1 C)-98.3 F (36.8 C)] 97 F (36.1 C) (10/02 0623) Pulse Rate:  [61-85] 61 (10/02 0623) Resp:  [13-21] 18 (10/02 0623) BP: (84-108)/(42-61) 107/54 mmHg (10/02 0623) SpO2:  [93 %-98 %] 98 % (10/02 0623) Weight:  [117.935 kg (260 lb)-123.061 kg (271 lb 4.8 oz)] 123.061 kg (271 lb 4.8 oz) (10/02 8938) Weight change:  Last BM Date: 12/01/13  Intake/Output from previous day: 10/01 0701 - 10/02 0700 In: 243 [P.O.:240; I.V.:3] Out: -   PHYSICAL EXAM General appearance: alert, cooperative and no distress Resp: clear to auscultation bilaterally Cardio: regular rate and rhythm, S1, S2 normal, no murmur, click, rub or gallop GI: soft, non-tender; bowel sounds normal; no masses,  no organomegaly Extremities: extremities normal, atraumatic, no cyanosis or edema  Lab Results:  Results for orders placed during the hospital encounter of 12/02/13 (from the past 48 hour(s))  CBG MONITORING, ED     Status: Abnormal   Collection Time    12/02/13 12:47 PM      Result Value Ref Range   Glucose-Capillary 357 (*) 70 - 99 mg/dL   Comment 1 Notify RN     Comment 2 Documented in Chart    CBC WITH DIFFERENTIAL     Status: Abnormal   Collection Time    12/02/13  1:02 PM      Result Value Ref Range   WBC 8.6  4.0 - 10.5 K/uL   RBC 4.09 (*) 4.22 - 5.81 MIL/uL   Hemoglobin 12.6 (*) 13.0 - 17.0 g/dL   HCT 35.9 (*) 39.0 - 52.0 %   MCV 87.8  78.0 - 100.0 fL   MCH 30.8  26.0 - 34.0 pg   MCHC 35.1  30.0 - 36.0 g/dL   RDW 14.2  11.5 - 15.5 %   Platelets 216  150 - 400 K/uL   Neutrophils Relative % 55  43 - 77 %   Neutro Abs 4.8  1.7 - 7.7 K/uL   Lymphocytes Relative 36  12 - 46 %   Lymphs Abs 3.1  0.7 - 4.0 K/uL   Monocytes  Relative 6  3 - 12 %   Monocytes Absolute 0.5  0.1 - 1.0 K/uL   Eosinophils Relative 2  0 - 5 %   Eosinophils Absolute 0.1  0.0 - 0.7 K/uL   Basophils Relative 1  0 - 1 %   Basophils Absolute 0.0  0.0 - 0.1 K/uL  COMPREHENSIVE METABOLIC PANEL     Status: Abnormal   Collection Time    12/02/13  1:02 PM      Result Value Ref Range   Sodium 133 (*) 137 - 147 mEq/L   Potassium 4.1  3.7 - 5.3 mEq/L   Chloride 92 (*) 96 - 112 mEq/L   CO2 23  19 - 32 mEq/L   Glucose, Bld 344 (*) 70 - 99 mg/dL   BUN 56 (*) 6 - 23 mg/dL   Creatinine, Ser 5.83 (*) 0.50 - 1.35 mg/dL   Calcium 9.5  8.4 - 10.5 mg/dL   Total Protein 7.6  6.0 - 8.3 g/dL   Albumin 4.4  3.5 - 5.2 g/dL   AST 31  0 - 37 U/L  ALT 28  0 - 53 U/L   Alkaline Phosphatase 89  39 - 117 U/L   Total Bilirubin 0.4  0.3 - 1.2 mg/dL   GFR calc non Af Amer 9 (*) >90 mL/min   GFR calc Af Amer 11 (*) >90 mL/min   Comment: (NOTE)     The eGFR has been calculated using the CKD EPI equation.     This calculation has not been validated in all clinical situations.     eGFR's persistently <90 mL/min signify possible Chronic Kidney     Disease.   Anion gap 18 (*) 5 - 15  TROPONIN I     Status: None   Collection Time    12/02/13  1:02 PM      Result Value Ref Range   Troponin I <0.30  <0.30 ng/mL   Comment:            Due to the release kinetics of cTnI,     a negative result within the first hours     of the onset of symptoms does not rule out     myocardial infarction with certainty.     If myocardial infarction is still suspected,     repeat the test at appropriate intervals.  HEMOGLOBIN A1C     Status: Abnormal   Collection Time    12/02/13  1:02 PM      Result Value Ref Range   Hemoglobin A1C 6.7 (*) <5.7 %   Comment: (NOTE)                                                                               According to the ADA Clinical Practice Recommendations for 2011, when     HbA1c is used as a screening test:      >=6.5%   Diagnostic  of Diabetes Mellitus               (if abnormal result is confirmed)     5.7-6.4%   Increased risk of developing Diabetes Mellitus     References:Diagnosis and Classification of Diabetes Mellitus,Diabetes     ZHGD,9242,68(TMHDQ 1):S62-S69 and Standards of Medical Care in             Diabetes - 2011,Diabetes Care,2011,34 (Suppl 1):S11-S61.   Mean Plasma Glucose 146 (*) <117 mg/dL   Comment: Performed at [facility], ED     Status: Abnormal   Collection Time    12/02/13  1:24 PM      Result Value Ref Range   Lactic Acid, Venous 2.75 (*) 0.5 - 2.2 mmol/L  GLUCOSE, CAPILLARY     Status: Abnormal   Collection Time    12/02/13  4:59 PM      Result Value Ref Range   Glucose-Capillary 264 (*) 70 - 99 mg/dL   Comment 1 Notify RN     Comment 2 Documented in Chart    URINALYSIS, ROUTINE W REFLEX MICROSCOPIC     Status: Abnormal   Collection Time    12/02/13  5:39 PM      Result Value Ref Range   Color, Urine YELLOW  YELLOW   APPearance HAZY (*)  CLEAR   Specific Gravity, Urine 1.020  1.005 - 1.030   pH 5.0  5.0 - 8.0   Glucose, UA 250 (*) NEGATIVE mg/dL   Hgb urine dipstick TRACE (*) NEGATIVE   Bilirubin Urine NEGATIVE  NEGATIVE   Ketones, ur NEGATIVE  NEGATIVE mg/dL   Protein, ur 30 (*) NEGATIVE mg/dL   Urobilinogen, UA 0.2  0.0 - 1.0 mg/dL   Nitrite NEGATIVE  NEGATIVE   Leukocytes, UA MODERATE (*) NEGATIVE  URINE MICROSCOPIC-ADD ON     Status: Abnormal   Collection Time    12/02/13  5:39 PM      Result Value Ref Range   Squamous Epithelial / LPF FEW (*) RARE   WBC, UA TOO NUMEROUS TO COUNT  <3 WBC/hpf   RBC / HPF 0-2  <3 RBC/hpf   Bacteria, UA MANY (*) RARE  GLUCOSE, CAPILLARY     Status: Abnormal   Collection Time    12/02/13 10:59 PM      Result Value Ref Range   Glucose-Capillary 246 (*) 70 - 99 mg/dL   Comment 1 Documented in Chart     Comment 2 Notify RN    BASIC METABOLIC PANEL     Status: Abnormal   Collection Time    12/03/13  6:09  AM      Result Value Ref Range   Sodium 138  137 - 147 mEq/L   Potassium 3.9  3.7 - 5.3 mEq/L   Chloride 100  96 - 112 mEq/L   Comment: DELTA CHECK NOTED   CO2 26  19 - 32 mEq/L   Glucose, Bld 214 (*) 70 - 99 mg/dL   BUN 53 (*) 6 - 23 mg/dL   Creatinine, Ser 3.29 (*) 0.50 - 1.35 mg/dL   Calcium 8.7  8.4 - 10.5 mg/dL   GFR calc non Af Amer 19 (*) >90 mL/min   GFR calc Af Amer 22 (*) >90 mL/min   Comment: (NOTE)     The eGFR has been calculated using the CKD EPI equation.     This calculation has not been validated in all clinical situations.     eGFR's persistently <90 mL/min signify possible Chronic Kidney     Disease.   Anion gap 12  5 - 15  CBC     Status: Abnormal   Collection Time    12/03/13  6:09 AM      Result Value Ref Range   WBC 4.6  4.0 - 10.5 K/uL   RBC 3.70 (*) 4.22 - 5.81 MIL/uL   Hemoglobin 11.5 (*) 13.0 - 17.0 g/dL   HCT 31.6 (*) 39.0 - 52.0 %   MCV 85.4  78.0 - 100.0 fL   MCH 31.1  26.0 - 34.0 pg   MCHC 36.4 (*) 30.0 - 36.0 g/dL   RDW 13.6  11.5 - 15.5 %   Platelets 159  150 - 400 K/uL   Comment: DELTA CHECK NOTED  GLUCOSE, CAPILLARY     Status: Abnormal   Collection Time    12/03/13  7:28 AM      Result Value Ref Range   Glucose-Capillary 202 (*) 70 - 99 mg/dL   Comment 1 Notify RN      ABGS No results found for this basename: PHART, PCO2, PO2ART, TCO2, HCO3,  in the last 72 hours CULTURES No results found for this or any previous visit (from the past 240 hour(s)). Studies/Results: Dg Chest 2 View  12/02/2013   CLINICAL DATA:  Dizziness. Patient status post fall today and yesterday.  EXAM: CHEST  2 VIEW  COMPARISON:  Single view of the chest 03/03/2013.  FINDINGS: Heart size and mediastinal contours are within normal limits. Both lungs are clear. Visualized skeletal structures are unremarkable.  IMPRESSION: Negative exam.   Electronically Signed   By: Inge Rise M.D.   On: 12/02/2013 14:09   Ct Head Wo Contrast  12/02/2013   CLINICAL DATA:   Dizziness.  Recent falls.  EXAM: CT HEAD WITHOUT CONTRAST  TECHNIQUE: Contiguous axial images were obtained from the base of the skull through the vertex without intravenous contrast.  COMPARISON:  None.  FINDINGS: Diffusely enlarged ventricles and subarachnoid spaces. No skull fracture, intracranial hemorrhage, mass lesion, CT evidence of acute infarction or paranasal sinus air-fluid levels.  IMPRESSION: No acute abnormality.  Moderate atrophy.   Electronically Signed   By: Enrique Sack M.D.   On: 12/02/2013 14:16   Dg Knee Complete 4 Views Right  12/02/2013   CLINICAL DATA:  63 year old male with acute onset dizziness and recent falls at the front or this callus. Fell onto right knee with right knee pain. Initial encounter.  EXAM: RIGHT KNEE - COMPLETE 4+ VIEW  COMPARISON:  Right lower extremity venous ultrasound 07/26/2004.  FINDINGS: Severe patellofemoral joint space loss and moderate degenerative spurring. Moderate medial and lateral compartment joint space loss and degenerative spurring. Widespread subchondral sclerosis. Patella intact. Trace if any joint effusion. No acute fracture or dislocation identified.  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 No acute fracture or dislocation identified.   Electronically Signed   By: Lars Pinks M.D.   On: 12/02/2013 14:11    Medications:  Prior to Admission:  Prescriptions prior to admission  Medication Sig Dispense Refill  . acetaminophen (TYLENOL) 500 MG tablet Take 500 mg by mouth every 6 (six) hours as needed for moderate pain.      Marland Kitchen albuterol (PROAIR HFA) 108 (90 BASE) MCG/ACT inhaler Inhale 2 puffs into the lungs every 6 (six) hours as needed for wheezing or shortness of breath.       . allopurinol (ZYLOPRIM) 300 MG tablet Take 300 mg by mouth daily.        Marland Kitchen amLODipine-benazepril (LOTREL) 10-40 MG per capsule Take 1 capsule by mouth daily.       . cloNIDine (CATAPRES) 0.2 MG tablet Take 0.2 mg by mouth 2 (two)  times daily.       . colchicine 0.6 MG tablet Take 0.6 mg by mouth daily.        . furosemide (LASIX) 40 MG tablet Take 40 mg by mouth 2 (two) times daily.      Marland Kitchen gabapentin (NEURONTIN) 400 MG capsule Take 400 mg by mouth 2 (two) times daily. Takes at noon & bedtime      . glimepiride (AMARYL) 2 MG tablet Take 2 mg by mouth daily before breakfast.        . HUMALOG KWIKPEN 100 UNIT/ML KiwkPen Inject 8-15 Units into the skin 3 (three) times daily before meals. Per sliding scale      . HYDROcodone-acetaminophen (NORCO/VICODIN) 5-325 MG per tablet Take 1 tablet by mouth every 6 (six) hours as needed for moderate pain.       Marland Kitchen lanolin ointment Apply 1 application topically 2 (two) times daily as needed for dry skin.      Marland Kitchen LANTUS SOLOSTAR 100 UNIT/ML Solostar Pen Inject 15 Units into the skin every morning.       Marland Kitchen  LINZESS 290 MCG CAPS capsule TAKE 1 CAPSULE ONCE DAILY 30 MINUTES BEFORE MEALS  30 capsule  5  . metoprolol (LOPRESSOR) 100 MG tablet Take 100 mg by mouth 2 (two) times daily.       Marland Kitchen omeprazole (PRILOSEC) 20 MG capsule Take 20 mg by mouth daily.      . simvastatin (ZOCOR) 10 MG tablet Take 10 mg by mouth daily.      Marland Kitchen zolpidem (AMBIEN) 10 MG tablet Take 10 mg by mouth at bedtime.        Scheduled: . allopurinol  300 mg Oral Daily  . colchicine  0.6 mg Oral Daily  . heparin  5,000 Units Subcutaneous 3 times per day  . insulin aspart  0-15 Units Subcutaneous TID WC  . insulin glargine  7 Units Subcutaneous Daily  . pantoprazole  40 mg Oral Daily  . simvastatin  10 mg Oral q1800  . sodium chloride  3 mL Intravenous Q12H   Continuous: . sodium chloride 100 mL/hr at 12/02/13 1702   HOZ:[id number], acetaminophen, albuterol, alum & mag hydroxide-simeth, ondansetron (ZOFRAN) IV, ondansetron  Assesment: He was admitted with acute renal failure. He has chronic renal failure at baseline and his baseline creatinine is approximately 2. He has diabetes and his blood sugar has been in the  200s. He has not been needing well. I think he is dehydrated. He has multiple other medical problems as listed. He has fallen during his acute illness but has not suffered any serious injury. Principal Problem:   Acute renal failure Active Problems:   GERD   Obesity   COPD (chronic obstructive pulmonary disease)   DM (diabetes mellitus)   HTN (hypertension)   Hyperlipemia   CRF (chronic renal failure)   Sleep apnea   Hyponatremia   Acute on chronic renal failure    Plan: Nephrology consultation. Continue with IV fluids.    LOS: 1 day   Sianni Cloninger L 12/03/2013, 8:56 AM

## 2013-12-04 LAB — GLUCOSE, CAPILLARY
Glucose-Capillary: 227 mg/dL — ABNORMAL HIGH (ref 70–99)
Glucose-Capillary: 248 mg/dL — ABNORMAL HIGH (ref 70–99)
Glucose-Capillary: 340 mg/dL — ABNORMAL HIGH (ref 70–99)
Glucose-Capillary: 245 mg/dL — ABNORMAL HIGH (ref 70–99)
Glucose-Capillary: 268 mg/dL — ABNORMAL HIGH (ref 70–99)

## 2013-12-04 LAB — BASIC METABOLIC PANEL
Anion gap: 14 (ref 5–15)
BUN: 32 mg/dL — ABNORMAL HIGH (ref 6–23)
CO2: 23 mEq/L (ref 19–32)
Calcium: 8.9 mg/dL (ref 8.4–10.5)
Chloride: 103 mEq/L (ref 96–112)
Creatinine, Ser: 1.69 mg/dL — ABNORMAL HIGH (ref 0.50–1.35)
GFR calc Af Amer: 49 mL/min — ABNORMAL LOW (ref >90)
GFR calc non Af Amer: 42 mL/min — ABNORMAL LOW (ref >90)
Glucose, Bld: 255 mg/dL — ABNORMAL HIGH (ref 70–99)
Potassium: 4 mEq/L (ref 3.7–5.3)
Sodium: 140 mEq/L (ref 137–147)

## 2013-12-04 LAB — PHOSPHORUS: Phosphorus: 2.5 mg/dL (ref 2.3–4.6)

## 2013-12-04 MED ORDER — CLONIDINE HCL 0.2 MG PO TABS
0.2000 mg | ORAL_TABLET | Freq: Two times a day (BID) | ORAL | Status: DC
  Administered 2013-12-04 – 2013-12-05 (×3): 0.2 mg via ORAL
  Filled 2013-12-04 (×4): qty 1

## 2013-12-04 MED ORDER — METOPROLOL TARTRATE 50 MG PO TABS
100.0000 mg | ORAL_TABLET | Freq: Two times a day (BID) | ORAL | Status: DC
  Administered 2013-12-04 – 2013-12-05 (×3): 100 mg via ORAL
  Filled 2013-12-04 (×3): qty 2

## 2013-12-04 MED ORDER — FUROSEMIDE 10 MG/ML IJ SOLN
40.0000 mg | Freq: Every day | INTRAMUSCULAR | Status: DC
  Administered 2013-12-04 – 2013-12-05 (×2): 40 mg via INTRAVENOUS
  Filled 2013-12-04 (×2): qty 4

## 2013-12-04 NOTE — Progress Notes (Signed)
Subjective: Interval History: has no complaint of nausea or vomiting. Patient also denies any dizziness or lightheadedness. Overall feels better..  Objective: Vital signs in last 24 hours: Temp:  [97.7 F (36.5 C)-98.9 F (37.2 C)] 98.4 F (36.9 C) (10/03 0600) Pulse Rate:  [75-87] 75 (10/03 0600) Resp:  [18-20] 20 (10/03 0600) BP: (160-197)/(68-72) 182/72 mmHg (10/03 0910) SpO2:  [97 %-99 %] 97 % (10/03 0600) Weight change:   Intake/Output from previous day: 10/02 0701 - 10/03 0700 In: 240 [P.O.:240] Out: -  Intake/Output this shift:    Generally he is alert and in no apparent distress. Chest is clear to auscultation . Heart exam regular rate and rhythm no murmur Extremities no edema   Lab Results:  Recent Labs  12/02/13 1302 12/03/13 0609  WBC 8.6 4.6  HGB 12.6* 11.5*  HCT 35.9* 31.6*  PLT 216 159   BMET:  Recent Labs  12/03/13 0609 12/04/13 0544  NA 138 140  K 3.9 4.0  CL 100 103  CO2 26 23  GLUCOSE 214* 255*  BUN 53* 32*  CREATININE 3.29* 1.69*  CALCIUM 8.7 8.9   No results found for this basename: PTH,  in the last 72 hours Iron Studies: No results found for this basename: IRON, TIBC, TRANSFERRIN, FERRITIN,  in the last 72 hours  Studies/Results: Dg Chest 2 View  12/02/2013   CLINICAL DATA:  Dizziness. Patient status post fall today and yesterday.  EXAM: CHEST  2 VIEW  COMPARISON:  Single view of the chest 03/03/2013.  FINDINGS: Heart size and mediastinal contours are within normal limits. Both lungs are clear. Visualized skeletal structures are unremarkable.  IMPRESSION: Negative exam.   Electronically Signed   By: Inge Rise M.D.   On: 12/02/2013 14:09   Ct Head Wo Contrast  12/02/2013   CLINICAL DATA:  Dizziness.  Recent falls.  EXAM: CT HEAD WITHOUT CONTRAST  TECHNIQUE: Contiguous axial images were obtained from the base of the skull through the vertex without intravenous contrast.  COMPARISON:  None.  FINDINGS: Diffusely enlarged ventricles  and subarachnoid spaces. No skull fracture, intracranial hemorrhage, mass lesion, CT evidence of acute infarction or paranasal sinus air-fluid levels.  IMPRESSION: No acute abnormality.  Moderate atrophy.   Electronically Signed   By: Enrique Sack M.D.   On: 12/02/2013 14:16   Dg Knee Complete 4 Views Right  12/02/2013   CLINICAL DATA:  63 year old male with acute onset dizziness and recent falls at the front or this callus. Fell onto right knee with right knee pain. Initial encounter.  EXAM: RIGHT KNEE - COMPLETE 4+ VIEW  COMPARISON:  Right lower extremity venous ultrasound 07/26/2004.  FINDINGS: Severe patellofemoral joint space loss and moderate degenerative spurring. Moderate medial and lateral compartment joint space loss and degenerative spurring. Widespread subchondral sclerosis. Patella intact. Trace if any joint effusion. No acute fracture or dislocation identified.  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 No acute fracture or dislocation identified.   Electronically Signed   By: Lars Pinks M.D.   On: 12/02/2013 14:11    I have reviewed the patient's current medications.   Assessment/Plan: Problem #1 acute kidney injury. His BUN creatinine is improving. Patient presently is a symptomatic. Problem #2 hypertension: His blood pressure seems to be somewhat high. Problem #3 history of sleep apnea Problem #4 history of sleep apnea Problem #5 history of diabetes:Poorly controlled. Patient and his families have poor understanding  And remains none complaint with his diet . Problem#6 Hyponatremia :  His sodium has corrected Problem#7 History of CHF: Patient advised to take his diuretics as needed. Advised to use his present weight when he goes home and take diuretic if he gains more than 2 liters and hold it if no gain or loose wight. Patient to weigh himself daily.  Problem#8 Metabolic bone disease: His calcium and phosphorus is in range. Patient advised to come  to the office in 4 weeks   LOS: 2 days   Parul Porcelli S 12/04/2013,9:50 AM

## 2013-12-04 NOTE — Progress Notes (Signed)
Subjective: He says he feels better. He has not had anymore diarrhea. His bowels moved yesterday. His blood pressure was up and I have restarted some of his blood pressure medications but held his ARB  Objective: Vital signs in last 24 hours: Temp:  [97.7 F (36.5 C)-98.9 F (37.2 C)] 98.4 F (36.9 C) (10/03 0600) Pulse Rate:  [75-87] 75 (10/03 0600) Resp:  [18-20] 20 (10/03 0600) BP: (160-197)/(68-72) 182/72 mmHg (10/03 0910) SpO2:  [97 %-99 %] 97 % (10/03 0600) Weight change:  Last BM Date: 12/03/13  Intake/Output from previous day: 10/02 0701 - 10/03 0700 In: 240 [P.O.:240] Out: -   PHYSICAL EXAM General appearance: alert, cooperative and no distress Resp: clear to auscultation bilaterally Cardio: regular rate and rhythm, S1, S2 normal, no murmur, click, rub or gallop GI: soft, non-tender; bowel sounds normal; no masses,  no organomegaly Extremities: extremities normal, atraumatic, no cyanosis or edema  Lab Results:  Results for orders placed during the hospital encounter of 12/02/13 (from the past 48 hour(s))  CBG MONITORING, ED     Status: Abnormal   Collection Time    12/02/13 12:47 PM      Result Value Ref Range   Glucose-Capillary 357 (*) 70 - 99 mg/dL   Comment 1 Notify RN     Comment 2 Documented in Chart    CBC WITH DIFFERENTIAL     Status: Abnormal   Collection Time    12/02/13  1:02 PM      Result Value Ref Range   WBC 8.6  4.0 - 10.5 K/uL   RBC 4.09 (*) 4.22 - 5.81 MIL/uL   Hemoglobin 12.6 (*) 13.0 - 17.0 g/dL   HCT 35.9 (*) 39.0 - 52.0 %   MCV 87.8  78.0 - 100.0 fL   MCH 30.8  26.0 - 34.0 pg   MCHC 35.1  30.0 - 36.0 g/dL   RDW 14.2  11.5 - 15.5 %   Platelets 216  150 - 400 K/uL   Neutrophils Relative % 55  43 - 77 %   Neutro Abs 4.8  1.7 - 7.7 K/uL   Lymphocytes Relative 36  12 - 46 %   Lymphs Abs 3.1  0.7 - 4.0 K/uL   Monocytes Relative 6  3 - 12 %   Monocytes Absolute 0.5  0.1 - 1.0 K/uL   Eosinophils Relative 2  0 - 5 %   Eosinophils  Absolute 0.1  0.0 - 0.7 K/uL   Basophils Relative 1  0 - 1 %   Basophils Absolute 0.0  0.0 - 0.1 K/uL  COMPREHENSIVE METABOLIC PANEL     Status: Abnormal   Collection Time    12/02/13  1:02 PM      Result Value Ref Range   Sodium 133 (*) 137 - 147 mEq/L   Potassium 4.1  3.7 - 5.3 mEq/L   Chloride 92 (*) 96 - 112 mEq/L   CO2 23  19 - 32 mEq/L   Glucose, Bld 344 (*) 70 - 99 mg/dL   BUN 56 (*) 6 - 23 mg/dL   Creatinine, Ser 5.83 (*) 0.50 - 1.35 mg/dL   Calcium 9.5  8.4 - 10.5 mg/dL   Total Protein 7.6  6.0 - 8.3 g/dL   Albumin 4.4  3.5 - 5.2 g/dL   AST 31  0 - 37 U/L   ALT 28  0 - 53 U/L   Alkaline Phosphatase 89  39 - 117 U/L   Total Bilirubin 0.4  0.3 - 1.2 mg/dL   GFR calc non Af Amer 9 (*) >90 mL/min   GFR calc Af Amer 11 (*) >90 mL/min   Comment: (NOTE)     The eGFR has been calculated using the CKD EPI equation.     This calculation has not been validated in all clinical situations.     eGFR's persistently <90 mL/min signify possible Chronic Kidney     Disease.   Anion gap 18 (*) 5 - 15  TROPONIN I     Status: None   Collection Time    12/02/13  1:02 PM      Result Value Ref Range   Troponin I <0.30  <0.30 ng/mL   Comment:            Due to the release kinetics of cTnI,     a negative result within the first hours     of the onset of symptoms does not rule out     myocardial infarction with certainty.     If myocardial infarction is still suspected,     repeat the test at appropriate intervals.  HEMOGLOBIN A1C     Status: Abnormal   Collection Time    12/02/13  1:02 PM      Result Value Ref Range   Hemoglobin A1C 6.7 (*) <5.7 %   Comment: (NOTE)                                                                               According to the ADA Clinical Practice Recommendations for 2011, when     HbA1c is used as a screening test:      >=6.5%   Diagnostic of Diabetes Mellitus               (if abnormal result is confirmed)     5.7-6.4%   Increased risk of  developing Diabetes Mellitus     References:Diagnosis and Classification of Diabetes Mellitus,Diabetes     EXBM,8413,24(MWNUU 1):S62-S69 and Standards of Medical Care in             Diabetes - 2011,Diabetes Care,2011,34 (Suppl 1):S11-S61.   Mean Plasma Glucose 146 (*) <117 mg/dL   Comment: Performed at [facility], ED     Status: Abnormal   Collection Time    12/02/13  1:24 PM      Result Value Ref Range   Lactic Acid, Venous 2.75 (*) 0.5 - 2.2 mmol/L  GLUCOSE, CAPILLARY     Status: Abnormal   Collection Time    12/02/13  4:59 PM      Result Value Ref Range   Glucose-Capillary 264 (*) 70 - 99 mg/dL   Comment 1 Notify RN     Comment 2 Documented in Chart    URINALYSIS, ROUTINE W REFLEX MICROSCOPIC     Status: Abnormal   Collection Time    12/02/13  5:39 PM      Result Value Ref Range   Color, Urine YELLOW  YELLOW   APPearance HAZY (*) CLEAR   Specific Gravity, Urine 1.020  1.005 - 1.030   pH 5.0  5.0 - 8.0   Glucose, UA  250 (*) NEGATIVE mg/dL   Hgb urine dipstick TRACE (*) NEGATIVE   Bilirubin Urine NEGATIVE  NEGATIVE   Ketones, ur NEGATIVE  NEGATIVE mg/dL   Protein, ur 30 (*) NEGATIVE mg/dL   Urobilinogen, UA 0.2  0.0 - 1.0 mg/dL   Nitrite NEGATIVE  NEGATIVE   Leukocytes, UA MODERATE (*) NEGATIVE  URINE MICROSCOPIC-ADD ON     Status: Abnormal   Collection Time    12/02/13  5:39 PM      Result Value Ref Range   Squamous Epithelial / LPF FEW (*) RARE   WBC, UA TOO NUMEROUS TO COUNT  <3 WBC/hpf   RBC / HPF 0-2  <3 RBC/hpf   Bacteria, UA MANY (*) RARE  GLUCOSE, CAPILLARY     Status: Abnormal   Collection Time    12/02/13 10:59 PM      Result Value Ref Range   Glucose-Capillary 246 (*) 70 - 99 mg/dL   Comment 1 Documented in Chart     Comment 2 Notify RN    BASIC METABOLIC PANEL     Status: Abnormal   Collection Time    12/03/13  6:09 AM      Result Value Ref Range   Sodium 138  137 - 147 mEq/L   Potassium 3.9  3.7 - 5.3 mEq/L    Chloride 100  96 - 112 mEq/L   Comment: DELTA CHECK NOTED   CO2 26  19 - 32 mEq/L   Glucose, Bld 214 (*) 70 - 99 mg/dL   BUN 53 (*) 6 - 23 mg/dL   Creatinine, Ser 3.29 (*) 0.50 - 1.35 mg/dL   Calcium 8.7  8.4 - 10.5 mg/dL   GFR calc non Af Amer 19 (*) >90 mL/min   GFR calc Af Amer 22 (*) >90 mL/min   Comment: (NOTE)     The eGFR has been calculated using the CKD EPI equation.     This calculation has not been validated in all clinical situations.     eGFR's persistently <90 mL/min signify possible Chronic Kidney     Disease.   Anion gap 12  5 - 15  CBC     Status: Abnormal   Collection Time    12/03/13  6:09 AM      Result Value Ref Range   WBC 4.6  4.0 - 10.5 K/uL   RBC 3.70 (*) 4.22 - 5.81 MIL/uL   Hemoglobin 11.5 (*) 13.0 - 17.0 g/dL   HCT 31.6 (*) 39.0 - 52.0 %   MCV 85.4  78.0 - 100.0 fL   MCH 31.1  26.0 - 34.0 pg   MCHC 36.4 (*) 30.0 - 36.0 g/dL   RDW 13.6  11.5 - 15.5 %   Platelets 159  150 - 400 K/uL   Comment: DELTA CHECK NOTED  GLUCOSE, CAPILLARY     Status: Abnormal   Collection Time    12/03/13  7:28 AM      Result Value Ref Range   Glucose-Capillary 202 (*) 70 - 99 mg/dL   Comment 1 Notify RN    GLUCOSE, CAPILLARY     Status: Abnormal   Collection Time    12/03/13 11:57 AM      Result Value Ref Range   Glucose-Capillary 303 (*) 70 - 99 mg/dL   Comment 1 Notify RN    GLUCOSE, CAPILLARY     Status: Abnormal   Collection Time    12/03/13  4:25 PM      Result  Value Ref Range   Glucose-Capillary 232 (*) 70 - 99 mg/dL   Comment 1 Documented in Chart     Comment 2 Notify RN    GLUCOSE, CAPILLARY     Status: Abnormal   Collection Time    12/03/13  9:01 PM      Result Value Ref Range   Glucose-Capillary 227 (*) 70 - 99 mg/dL   Comment 1 Notify RN     Comment 2 Documented in Chart    PHOSPHORUS     Status: None   Collection Time    12/04/13  5:44 AM      Result Value Ref Range   Phosphorus 2.5  2.3 - 4.6 mg/dL  BASIC METABOLIC PANEL     Status: Abnormal    Collection Time    12/04/13  5:44 AM      Result Value Ref Range   Sodium 140  137 - 147 mEq/L   Potassium 4.0  3.7 - 5.3 mEq/L   Chloride 103  96 - 112 mEq/L   CO2 23  19 - 32 mEq/L   Glucose, Bld 255 (*) 70 - 99 mg/dL   BUN 32 (*) 6 - 23 mg/dL   Comment: DELTA CHECK NOTED   Creatinine, Ser 1.69 (*) 0.50 - 1.35 mg/dL   Calcium 8.9  8.4 - 10.5 mg/dL   GFR calc non Af Amer 42 (*) >90 mL/min   GFR calc Af Amer 49 (*) >90 mL/min   Comment: (NOTE)     The eGFR has been calculated using the CKD EPI equation.     This calculation has not been validated in all clinical situations.     eGFR's persistently <90 mL/min signify possible Chronic Kidney     Disease.   Anion gap 14  5 - 15  GLUCOSE, CAPILLARY     Status: Abnormal   Collection Time    12/04/13  7:30 AM      Result Value Ref Range   Glucose-Capillary 248 (*) 70 - 99 mg/dL    ABGS No results found for this basename: PHART, PCO2, PO2ART, TCO2, HCO3,  in the last 72 hours CULTURES No results found for this or any previous visit (from the past 240 hour(s)). Studies/Results: Dg Chest 2 View  12/02/2013   CLINICAL DATA:  Dizziness. Patient status post fall today and yesterday.  EXAM: CHEST  2 VIEW  COMPARISON:  Single view of the chest 03/03/2013.  FINDINGS: Heart size and mediastinal contours are within normal limits. Both lungs are clear. Visualized skeletal structures are unremarkable.  IMPRESSION: Negative exam.   Electronically Signed   By: Inge Rise M.D.   On: 12/02/2013 14:09   Ct Head Wo Contrast  12/02/2013   CLINICAL DATA:  Dizziness.  Recent falls.  EXAM: CT HEAD WITHOUT CONTRAST  TECHNIQUE: Contiguous axial images were obtained from the base of the skull through the vertex without intravenous contrast.  COMPARISON:  None.  FINDINGS: Diffusely enlarged ventricles and subarachnoid spaces. No skull fracture, intracranial hemorrhage, mass lesion, CT evidence of acute infarction or paranasal sinus air-fluid levels.   IMPRESSION: No acute abnormality.  Moderate atrophy.   Electronically Signed   By: Enrique Sack M.D.   On: 12/02/2013 14:16   Dg Knee Complete 4 Views Right  12/02/2013   CLINICAL DATA:  64 year old male with acute onset dizziness and recent falls at the front or this callus. Fell onto right knee with right knee pain. Initial encounter.  EXAM: RIGHT KNEE -  COMPLETE 4+ VIEW  COMPARISON:  Right lower extremity venous ultrasound 07/26/2004.  FINDINGS: Severe patellofemoral joint space loss and moderate degenerative spurring. Moderate medial and lateral compartment joint space loss and degenerative spurring. Widespread subchondral sclerosis. Patella intact. Trace if any joint effusion. No acute fracture or dislocation identified.  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 No acute fracture or dislocation identified.   Electronically Signed   By: Lars Pinks M.D.   On: 12/02/2013 14:11    Medications:  Prior to Admission:  Prescriptions prior to admission  Medication Sig Dispense Refill  . acetaminophen (TYLENOL) 500 MG tablet Take 500 mg by mouth every 6 (six) hours as needed for moderate pain.      Marland Kitchen albuterol (PROAIR HFA) 108 (90 BASE) MCG/ACT inhaler Inhale 2 puffs into the lungs every 6 (six) hours as needed for wheezing or shortness of breath.       . allopurinol (ZYLOPRIM) 300 MG tablet Take 300 mg by mouth daily.        Marland Kitchen amLODipine-benazepril (LOTREL) 10-40 MG per capsule Take 1 capsule by mouth daily.       . cloNIDine (CATAPRES) 0.2 MG tablet Take 0.2 mg by mouth 2 (two) times daily.       . colchicine 0.6 MG tablet Take 0.6 mg by mouth daily.        . furosemide (LASIX) 40 MG tablet Take 40 mg by mouth 2 (two) times daily.      Marland Kitchen gabapentin (NEURONTIN) 400 MG capsule Take 400 mg by mouth 2 (two) times daily. Takes at noon & bedtime      . glimepiride (AMARYL) 2 MG tablet Take 2 mg by mouth daily before breakfast.        . HUMALOG KWIKPEN 100 UNIT/ML KiwkPen  Inject 8-15 Units into the skin 3 (three) times daily before meals. Per sliding scale      . HYDROcodone-acetaminophen (NORCO/VICODIN) 5-325 MG per tablet Take 1 tablet by mouth every 6 (six) hours as needed for moderate pain.       Marland Kitchen lanolin ointment Apply 1 application topically 2 (two) times daily as needed for dry skin.      Marland Kitchen LANTUS SOLOSTAR 100 UNIT/ML Solostar Pen Inject 15 Units into the skin every morning.       Marland Kitchen LINZESS 290 MCG CAPS capsule TAKE 1 CAPSULE ONCE DAILY 30 MINUTES BEFORE MEALS  30 capsule  5  . metoprolol (LOPRESSOR) 100 MG tablet Take 100 mg by mouth 2 (two) times daily.       Marland Kitchen omeprazole (PRILOSEC) 20 MG capsule Take 20 mg by mouth daily.      . simvastatin (ZOCOR) 10 MG tablet Take 10 mg by mouth daily.      Marland Kitchen zolpidem (AMBIEN) 10 MG tablet Take 10 mg by mouth at bedtime.        Scheduled: . allopurinol  300 mg Oral Daily  . cloNIDine  0.2 mg Oral BID  . colchicine  0.6 mg Oral Daily  . furosemide  40 mg Intravenous Daily  . heparin  5,000 Units Subcutaneous 3 times per day  . insulin aspart  0-15 Units Subcutaneous TID WC  . insulin glargine  7 Units Subcutaneous Daily  . metoprolol tartrate  100 mg Oral BID  . pantoprazole  40 mg Oral Daily  . simvastatin  10 mg Oral q1800  . sodium chloride  3 mL Intravenous Q12H   Continuous: . sodium chloride 100 mL/hr at 12/03/13  2129   ATF:[id number], acetaminophen, albuterol, alum & mag hydroxide-simeth, ondansetron (ZOFRAN) IV, ondansetron  Assesment: He was admitted with the acute on chronic renal failure and his renal function is now approaching baseline. He has multiple other medical problems including congestive heart failure. His blood pressure has been up and he is restarting most of his blood pressure medications. He's going to get Lasix today. Principal Problem:   Acute renal failure Active Problems:   GERD   Obesity   COPD (chronic obstructive pulmonary disease)   DM (diabetes mellitus)   HTN  (hypertension)   Hyperlipemia   CRF (chronic renal failure)   Sleep apnea   Hyponatremia   Acute on chronic renal failure    Plan: I discussed his situation with nephrology and plan is for him to get Lasix today restart his blood pressure medications recheck his creatinine in the morning and he can probably go home tomorrow    LOS: 2 days   Brittanyann Wittner L 12/04/2013, 10:09 AM

## 2013-12-05 DIAGNOSIS — E860 Dehydration: Secondary | ICD-10-CM | POA: Diagnosis present

## 2013-12-05 LAB — BASIC METABOLIC PANEL
Anion gap: 12 (ref 5–15)
BUN: 23 mg/dL (ref 6–23)
CO2: 24 mEq/L (ref 19–32)
Calcium: 9.2 mg/dL (ref 8.4–10.5)
Chloride: 104 mEq/L (ref 96–112)
Creatinine, Ser: 1.46 mg/dL — ABNORMAL HIGH (ref 0.50–1.35)
GFR calc Af Amer: 58 mL/min — ABNORMAL LOW (ref >90)
GFR calc non Af Amer: 50 mL/min — ABNORMAL LOW (ref >90)
Glucose, Bld: 248 mg/dL — ABNORMAL HIGH (ref 70–99)
Potassium: 4 mEq/L (ref 3.7–5.3)
Sodium: 140 mEq/L (ref 137–147)

## 2013-12-05 LAB — GLUCOSE, CAPILLARY
Glucose-Capillary: 249 mg/dL — ABNORMAL HIGH (ref 70–99)
Glucose-Capillary: 324 mg/dL — ABNORMAL HIGH (ref 70–99)

## 2013-12-05 MED ORDER — HYDRALAZINE HCL 25 MG PO TABS
50.0000 mg | ORAL_TABLET | Freq: Two times a day (BID) | ORAL | Status: DC
  Administered 2013-12-05: 50 mg via ORAL
  Filled 2013-12-05: qty 2

## 2013-12-05 MED ORDER — AMLODIPINE BESYLATE 5 MG PO TABS: 5.0000 mg | ORAL_TABLET | Freq: Every day | ORAL | Status: DC

## 2013-12-05 MED ORDER — HYDRALAZINE HCL 50 MG PO TABS: 50.0000 mg | ORAL_TABLET | Freq: Two times a day (BID) | ORAL | Status: DC

## 2013-12-05 MED ORDER — FUROSEMIDE 40 MG PO TABS: 40.0000 mg | ORAL_TABLET | Freq: Every day | ORAL | Status: DC

## 2013-12-05 MED ORDER — ACETAMINOPHEN 500 MG PO TABS: 500.0000 mg | ORAL_TABLET | Freq: Four times a day (QID) | ORAL | Status: DC | PRN

## 2013-12-05 NOTE — Discharge Summary (Signed)
Physician Discharge Summary  Patient ID: Frank Hoover MRN: [id number] DOB/AGE: 09/08/1950 63 y.o. Primary Care Physician:Eldean Klatt L, MD Admit date: 12/02/2013 Discharge date: 12/05/2013    Discharge Diagnoses:   Principal Problem:   Acute renal failure Active Problems:   GERD   Obesity   COPD (chronic obstructive pulmonary disease)   DM (diabetes mellitus)   HTN (hypertension)   Hyperlipemia   CRF (chronic renal failure)   Sleep apnea   Hyponatremia   Acute on chronic renal failure   Dehydration     Medication List    STOP taking these medications       amLODipine-benazepril 10-40 MG per capsule  Commonly known as:  LOTREL      TAKE these medications       acetaminophen 500 MG tablet  Commonly known as:  TYLENOL  Take 1 tablet (500 mg total) by mouth every 6 (six) hours as needed for moderate pain.     allopurinol 300 MG tablet  Commonly known as:  ZYLOPRIM  Take 300 mg by mouth daily.     amLODipine 5 MG tablet  Commonly known as:  NORVASC  Take 1 tablet (5 mg total) by mouth daily.     cloNIDine 0.2 MG tablet  Commonly known as:  CATAPRES  Take 0.2 mg by mouth 2 (two) times daily.     colchicine 0.6 MG tablet  Take 0.6 mg by mouth daily.     furosemide 40 MG tablet  Commonly known as:  LASIX  Take 1 tablet (40 mg total) by mouth daily.     gabapentin 400 MG capsule  Commonly known as:  NEURONTIN  Take 400 mg by mouth 2 (two) times daily. Takes at noon & bedtime     glimepiride 2 MG tablet  Commonly known as:  AMARYL  Take 2 mg by mouth daily before breakfast.     HUMALOG KWIKPEN 100 UNIT/ML KiwkPen  Generic drug:  insulin lispro  Inject 8-15 Units into the skin 3 (three) times daily before meals. Per sliding scale     hydrALAZINE 50 MG tablet  Commonly known as:  APRESOLINE  Take 1 tablet (50 mg total) by mouth 2 (two) times daily.     HYDROcodone-acetaminophen 5-325 MG per tablet  Commonly known as:  NORCO/VICODIN  Take 1  tablet by mouth every 6 (six) hours as needed for moderate pain.     lanolin ointment  Apply 1 application topically 2 (two) times daily as needed for dry skin.     LANTUS SOLOSTAR 100 UNIT/ML Solostar Pen  Generic drug:  Insulin Glargine  Inject 15 Units into the skin every morning.     LINZESS 290 MCG Caps capsule  Generic drug:  Linaclotide  TAKE 1 CAPSULE ONCE DAILY 30 MINUTES BEFORE MEALS     metoprolol 100 MG tablet  Commonly known as:  LOPRESSOR  Take 100 mg by mouth 2 (two) times daily.     omeprazole 20 MG capsule  Commonly known as:  PRILOSEC  Take 20 mg by mouth daily.     PROAIR HFA 108 (90 BASE) MCG/ACT inhaler  Generic drug:  albuterol  Inhale 2 puffs into the lungs every 6 (six) hours as needed for wheezing or shortness of breath.     simvastatin 10 MG tablet  Commonly known as:  ZOCOR  Take 10 mg by mouth daily.     zolpidem 10 MG tablet  Commonly known as:  AMBIEN  Take 10 mg by  mouth at bedtime.        Discharged Condition: Improved    Consults: Nephrology  Significant Diagnostic Studies: Dg Chest 2 View  12/02/2013   CLINICAL DATA:  Dizziness. Patient status post fall today and yesterday.  EXAM: CHEST  2 VIEW  COMPARISON:  Single view of the chest 03/03/2013.  FINDINGS: Heart size and mediastinal contours are within normal limits. Both lungs are clear. Visualized skeletal structures are unremarkable.  IMPRESSION: Negative exam.   Electronically Signed   By: Inge Rise M.D.   On: 12/02/2013 14:09   Ct Head Wo Contrast  12/02/2013   CLINICAL DATA:  Dizziness.  Recent falls.  EXAM: CT HEAD WITHOUT CONTRAST  TECHNIQUE: Contiguous axial images were obtained from the base of the skull through the vertex without intravenous contrast.  COMPARISON:  None.  FINDINGS: Diffusely enlarged ventricles and subarachnoid spaces. No skull fracture, intracranial hemorrhage, mass lesion, CT evidence of acute infarction or paranasal sinus air-fluid levels.   IMPRESSION: No acute abnormality.  Moderate atrophy.   Electronically Signed   By: Enrique Sack M.D.   On: 12/02/2013 14:16   Dg Knee Complete 4 Views Right  12/02/2013   CLINICAL DATA:  63 year old male with acute onset dizziness and recent falls at the front or this callus. Fell onto right knee with right knee pain. Initial encounter.  EXAM: RIGHT KNEE - COMPLETE 4+ VIEW  COMPARISON:  Right lower extremity venous ultrasound 07/26/2004.  FINDINGS: Severe patellofemoral joint space loss and moderate degenerative spurring. Moderate medial and lateral compartment joint space loss and degenerative spurring. Widespread subchondral sclerosis. Patella intact. Trace if any joint effusion. No acute fracture or dislocation identified.  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 No acute fracture or dislocation identified.   Electronically Signed   By: Lars Pinks M.D.   On: 12/02/2013 14:11    Lab Results: Basic Metabolic Panel:  Recent Labs  12/03/13 0609 12/04/13 0544 12/05/13 0528  NA 138 140 140  K 3.9 4.0 4.0  CL 100 103 104  CO2 26 23 24   GLUCOSE 214* 255* 248*  BUN 53* 32* 23  CREATININE 3.29* 1.69* 1.46*  CALCIUM 8.7 8.9 9.2  PHOS  --  2.5  --    Liver Function Tests:  Recent Labs  12/02/13 1302  AST 31  ALT 28  ALKPHOS 89  BILITOT 0.4  PROT 7.6  ALBUMIN 4.4     CBC:  Recent Labs  12/02/13 1302 12/03/13 0609  WBC 8.6 4.6  NEUTROABS 4.8  --   HGB 12.6* 11.5*  HCT 35.9* 31.6*  MCV 87.8 85.4  PLT 216 159    No results found for this or any previous visit (from the past 240 hour(s)).   Hospital Course: This is a 63 year old with chronic renal failure who had been sick at home with oral intake nausea and diarrhea. He presented to the emergency department and was found to have acute on chronic renal failure. He appear to be dehydrated. He was started on IV fluid replacement. Nephrology consultation was obtained. His blood pressure  medications were initially held because he was mildly hypotensive. He had been on amlodipine/benazepril and the benazepril portion of that has been discontinued. He was able to manage oral intake well had no further abdominal complaints and his renal function improved to the point that his creatinine came down to approximately 1.6 by the time of discharge  Discharge Exam: Blood pressure 152/68, pulse 70, temperature 98.4 F (36.9  C), temperature source Oral, resp. rate 20, height 5' 6"  (1.676 m), weight 120.11 kg (264 lb 12.7 oz), SpO2 100.00%. He is awake and alert. His chest is clear. His heart is regular. Abdomen is soft.  Disposition: Home with home health services he will have basic metabolic profile next week      Discharge Instructions   Discharge patient    Complete by:  As directed      Face-to-face encounter (required for Medicare/Medicaid patients)    Complete by:  As directed   I Ryun Velez L certify that this patient is under my care and that I, or a nurse practitioner or physician's assistant working with me, had a face-to-face encounter that meets the physician face-to-face encounter requirements with this patient on 12/05/2013. The encounter with the patient was in whole, or in part for the following medical condition(s) which is the primary reason for home health care (List medical condition): Acute renal failure  The encounter with the patient was in whole, or in part, for the following medical condition, which is the primary reason for home health care:  Acute renal failure  I certify that, based on my findings, the following services are medically necessary home health services:  Nursing  My clinical findings support the need for the above services:  Shortness of breath with activity  Further, I certify that my clinical findings support that this patient is homebound due to:  Shortness of Breath with activity  Reason for Medically Necessary Home Health Services:  Skilled  Nursing- Change/Decline in Patient Status     Home Health    Complete by:  As directed   To provide the following care/treatments:  RN  Basic metabolic profile on 82/95/6213           Follow-up Information   Follow up with [facility] S, MD In 4 weeks.   Specialty:  Nephrology   Contact information:   [address] [place] [address] [phone]       Follow up with [facility] S, MD In 4 weeks.   Specialty:  Nephrology   Contact information:   [address] [address] [phone]       Signed: Alonza Bogus   12/05/2013, 10:25 AM

## 2013-12-05 NOTE — Discharge Planning (Signed)
Pt and family stated he was ready to go home.  IVsand tele removed.  Pt told of needed FU appointment and also the scripts were sent to pharm.  Pt stated he was in no pain.  Pt given insulin 11u prior to DC for 324 BS and then ate lunch in hospital before leaving.  Pt will be wheeled to car by PCT and family when ready.

## 2013-12-05 NOTE — Progress Notes (Signed)
Subjective: He says he feels better and wants to go home. He has no new complaints. His breathing is okay. He does not have any abdominal pain now  Objective: Vital signs in last 24 hours: Temp:  [98.4 F (36.9 C)-99.4 F (37.4 C)] 98.4 F (36.9 C) (10/04 0500) Pulse Rate:  [70-80] 70 (10/04 0500) Resp:  [20] 20 (10/04 0500) BP: (152-178)/(68-78) 152/68 mmHg (10/04 0842) SpO2:  [99 %-100 %] 100 % (10/04 0500) Weight:  [120 kg (264 lb 8.8 oz)-120.385 kg (265 lb 6.4 oz)] 120.11 kg (264 lb 12.7 oz) (10/04 0500) Weight change:  Last BM Date: 12/03/13  Intake/Output from previous day:    PHYSICAL EXAM General appearance: alert, cooperative, no distress and morbidly obese Resp: clear to auscultation bilaterally Cardio: regular rate and rhythm, S1, S2 normal, no murmur, click, rub or gallop GI: soft, non-tender; bowel sounds normal; no masses,  no organomegaly Extremities: extremities normal, atraumatic, no cyanosis or edema  Lab Results:  Results for orders placed during the hospital encounter of 12/02/13 (from the past 48 hour(s))  GLUCOSE, CAPILLARY     Status: Abnormal   Collection Time    12/03/13 11:57 AM      Result Value Ref Range   Glucose-Capillary 303 (*) 70 - 99 mg/dL   Comment 1 Notify RN    GLUCOSE, CAPILLARY     Status: Abnormal   Collection Time    12/03/13  4:25 PM      Result Value Ref Range   Glucose-Capillary 232 (*) 70 - 99 mg/dL   Comment 1 Documented in Chart     Comment 2 Notify RN    GLUCOSE, CAPILLARY     Status: Abnormal   Collection Time    12/03/13  9:01 PM      Result Value Ref Range   Glucose-Capillary 227 (*) 70 - 99 mg/dL   Comment 1 Notify RN     Comment 2 Documented in Chart    PHOSPHORUS     Status: None   Collection Time    12/04/13  5:44 AM      Result Value Ref Range   Phosphorus 2.5  2.3 - 4.6 mg/dL  BASIC METABOLIC PANEL     Status: Abnormal   Collection Time    12/04/13  5:44 AM      Result Value Ref Range   Sodium 140  137  - 147 mEq/L   Potassium 4.0  3.7 - 5.3 mEq/L   Chloride 103  96 - 112 mEq/L   CO2 23  19 - 32 mEq/L   Glucose, Bld 255 (*) 70 - 99 mg/dL   BUN 32 (*) 6 - 23 mg/dL   Comment: DELTA CHECK NOTED   Creatinine, Ser 1.69 (*) 0.50 - 1.35 mg/dL   Calcium 8.9  8.4 - 10.5 mg/dL   GFR calc non Af Amer 42 (*) >90 mL/min   GFR calc Af Amer 49 (*) >90 mL/min   Comment: (NOTE)     The eGFR has been calculated using the CKD EPI equation.     This calculation has not been validated in all clinical situations.     eGFR's persistently <90 mL/min signify possible Chronic Kidney     Disease.   Anion gap 14  5 - 15  GLUCOSE, CAPILLARY     Status: Abnormal   Collection Time    12/04/13  7:30 AM      Result Value Ref Range   Glucose-Capillary 248 (*) 70 -  99 mg/dL  GLUCOSE, CAPILLARY     Status: Abnormal   Collection Time    12/04/13 11:18 AM      Result Value Ref Range   Glucose-Capillary 340 (*) 70 - 99 mg/dL  GLUCOSE, CAPILLARY     Status: Abnormal   Collection Time    12/04/13  5:21 PM      Result Value Ref Range   Glucose-Capillary 245 (*) 70 - 99 mg/dL  GLUCOSE, CAPILLARY     Status: Abnormal   Collection Time    12/04/13  9:42 PM      Result Value Ref Range   Glucose-Capillary 268 (*) 70 - 99 mg/dL  BASIC METABOLIC PANEL     Status: Abnormal   Collection Time    12/05/13  5:28 AM      Result Value Ref Range   Sodium 140  137 - 147 mEq/L   Potassium 4.0  3.7 - 5.3 mEq/L   Chloride 104  96 - 112 mEq/L   CO2 24  19 - 32 mEq/L   Glucose, Bld 248 (*) 70 - 99 mg/dL   BUN 23  6 - 23 mg/dL   Creatinine, Ser 1.46 (*) 0.50 - 1.35 mg/dL   Calcium 9.2  8.4 - 10.5 mg/dL   GFR calc non Af Amer 50 (*) >90 mL/min   GFR calc Af Amer 58 (*) >90 mL/min   Comment: (NOTE)     The eGFR has been calculated using the CKD EPI equation.     This calculation has not been validated in all clinical situations.     eGFR's persistently <90 mL/min signify possible Chronic Kidney     Disease.   Anion gap 12  5  - 15  GLUCOSE, CAPILLARY     Status: Abnormal   Collection Time    12/05/13  7:53 AM      Result Value Ref Range   Glucose-Capillary 249 (*) 70 - 99 mg/dL    ABGS No results found for this basename: PHART, PCO2, PO2ART, TCO2, HCO3,  in the last 72 hours CULTURES No results found for this or any previous visit (from the past 240 hour(s)). Studies/Results: No results found.  Medications:  Prior to Admission:  Prescriptions prior to admission  Medication Sig Dispense Refill  . acetaminophen (TYLENOL) 500 MG tablet Take 500 mg by mouth every 6 (six) hours as needed for moderate pain.      Marland Kitchen albuterol (PROAIR HFA) 108 (90 BASE) MCG/ACT inhaler Inhale 2 puffs into the lungs every 6 (six) hours as needed for wheezing or shortness of breath.       . allopurinol (ZYLOPRIM) 300 MG tablet Take 300 mg by mouth daily.        Marland Kitchen amLODipine-benazepril (LOTREL) 10-40 MG per capsule Take 1 capsule by mouth daily.       . cloNIDine (CATAPRES) 0.2 MG tablet Take 0.2 mg by mouth 2 (two) times daily.       . colchicine 0.6 MG tablet Take 0.6 mg by mouth daily.        . furosemide (LASIX) 40 MG tablet Take 40 mg by mouth 2 (two) times daily.      Marland Kitchen gabapentin (NEURONTIN) 400 MG capsule Take 400 mg by mouth 2 (two) times daily. Takes at noon & bedtime      . glimepiride (AMARYL) 2 MG tablet Take 2 mg by mouth daily before breakfast.        . HUMALOG KWIKPEN 100 UNIT/ML  KiwkPen Inject 8-15 Units into the skin 3 (three) times daily before meals. Per sliding scale      . HYDROcodone-acetaminophen (NORCO/VICODIN) 5-325 MG per tablet Take 1 tablet by mouth every 6 (six) hours as needed for moderate pain.       Marland Kitchen lanolin ointment Apply 1 application topically 2 (two) times daily as needed for dry skin.      Marland Kitchen LANTUS SOLOSTAR 100 UNIT/ML Solostar Pen Inject 15 Units into the skin every morning.       Marland Kitchen LINZESS 290 MCG CAPS capsule TAKE 1 CAPSULE ONCE DAILY 30 MINUTES BEFORE MEALS  30 capsule  5  . metoprolol  (LOPRESSOR) 100 MG tablet Take 100 mg by mouth 2 (two) times daily.       Marland Kitchen omeprazole (PRILOSEC) 20 MG capsule Take 20 mg by mouth daily.      . simvastatin (ZOCOR) 10 MG tablet Take 10 mg by mouth daily.      Marland Kitchen zolpidem (AMBIEN) 10 MG tablet Take 10 mg by mouth at bedtime.        Scheduled: . allopurinol  300 mg Oral Daily  . cloNIDine  0.2 mg Oral BID  . colchicine  0.6 mg Oral Daily  . furosemide  40 mg Oral Daily  . heparin  5,000 Units Subcutaneous 3 times per day  . hydrALAZINE  50 mg Oral BID  . insulin aspart  0-15 Units Subcutaneous TID WC  . insulin glargine  7 Units Subcutaneous Daily  . metoprolol tartrate  100 mg Oral BID  . pantoprazole  40 mg Oral Daily  . simvastatin  10 mg Oral q1800  . sodium chloride  3 mL Intravenous Q12H   Continuous:  YOM:[id number], acetaminophen, albuterol, alum & mag hydroxide-simeth, ondansetron (ZOFRAN) IV, ondansetron  Assesment: He was admitted with acute renal failure that I think is related to dehydration from poor oral intake. He is much improved. His renal function is back to baseline. He has hypertension his blood pressure is not totally controlled at this point. I think is ready for discharge. Principal Problem:   Acute renal failure Active Problems:   GERD   Obesity   COPD (chronic obstructive pulmonary disease)   DM (diabetes mellitus)   HTN (hypertension)   Hyperlipemia   CRF (chronic renal failure)   Sleep apnea   Hyponatremia   Acute on chronic renal failure    Plan: Discharge home he will have home health services    LOS: 3 days   [place] L 12/05/2013, 10:17 AM

## 2013-12-05 NOTE — Progress Notes (Signed)
Subjective: Interval History: No dizziness or light headedness. Feels better  Objective: Vital signs in last 24 hours: Temp:  [98.4 F (36.9 C)-99.4 F (37.4 C)] 98.4 F (36.9 C) (10/04 0500) Pulse Rate:  [70-80] 70 (10/04 0500) Resp:  [20] 20 (10/04 0500) BP: (152-178)/(68-78) 152/68 mmHg (10/04 0842) SpO2:  [99 %-100 %] 100 % (10/04 0500) Weight:  [120 kg (264 lb 8.8 oz)-120.385 kg (265 lb 6.4 oz)] 120.11 kg (264 lb 12.7 oz) (10/04 0500) Weight change:   Intake/Output from previous day:   Intake/Output this shift:    Generally he is alert and in no apparent distress. Chest is clear to auscultation . Heart exam regular rate and rhythm no murmur Extremities no edema   Lab Results:  Recent Labs  12/02/13 1302 12/03/13 0609  WBC 8.6 4.6  HGB 12.6* 11.5*  HCT 35.9* 31.6*  PLT 216 159   BMET:   Recent Labs  12/04/13 0544 12/05/13 0528  NA 140 140  K 4.0 4.0  CL 103 104  CO2 23 24  GLUCOSE 255* 248*  BUN 32* 23  CREATININE 1.69* 1.46*  CALCIUM 8.9 9.2   No results found for this basename: PTH,  in the last 72 hours Iron Studies: No results found for this basename: IRON, TIBC, TRANSFERRIN, FERRITIN,  in the last 72 hours  Studies/Results: No results found.  I have reviewed the patient's current medications.   Assessment/Plan: Problem #1 acute kidney injury. His BUN creatinine is improving. His renal function has returned to his base line Problem #2 hypertension: His blood pressure is still high but better. Presently on Metoprolol Problem #3 history of sleep apnea Problem #4 history of diabetes:Poorly controlled. Patient and his families have poor understanding  And remains none complaint with his diet . Problem#5 Hyponatremia : His sodium has corrected Problem#6 History of CHF: No sign of fluid over load.  Problem#7 Metabolic bone disease: His calcium and phosphorus is in range. Plan: d/c iv lasix Start on Lasix 40 mg po once a day prn chf D/c  ivf Hydralazine 50 mg po bid   LOS: 3 days   Iyad Deroo S 12/05/2013,10:09 AM

## 2014-03-04 DIAGNOSIS — G4733 Obstructive sleep apnea (adult) (pediatric): Secondary | ICD-10-CM | POA: Diagnosis not present

## 2014-03-04 DIAGNOSIS — R0902 Hypoxemia: Secondary | ICD-10-CM | POA: Diagnosis not present

## 2014-03-26 DIAGNOSIS — G4733 Obstructive sleep apnea (adult) (pediatric): Secondary | ICD-10-CM | POA: Diagnosis not present

## 2014-03-26 DIAGNOSIS — R0902 Hypoxemia: Secondary | ICD-10-CM | POA: Diagnosis not present

## 2014-03-29 DIAGNOSIS — E1142 Type 2 diabetes mellitus with diabetic polyneuropathy: Secondary | ICD-10-CM | POA: Diagnosis not present

## 2014-03-29 DIAGNOSIS — L84 Corns and callosities: Secondary | ICD-10-CM | POA: Diagnosis not present

## 2014-03-29 DIAGNOSIS — B351 Tinea unguium: Secondary | ICD-10-CM | POA: Diagnosis not present

## 2014-04-05 ENCOUNTER — Encounter: Payer: Self-pay | Admitting: Internal Medicine

## 2014-04-25 DIAGNOSIS — N183 Chronic kidney disease, stage 3 (moderate): Secondary | ICD-10-CM | POA: Diagnosis not present

## 2014-04-25 DIAGNOSIS — R809 Proteinuria, unspecified: Secondary | ICD-10-CM | POA: Diagnosis not present

## 2014-04-25 DIAGNOSIS — D649 Anemia, unspecified: Secondary | ICD-10-CM | POA: Diagnosis not present

## 2014-04-25 DIAGNOSIS — I10 Essential (primary) hypertension: Secondary | ICD-10-CM | POA: Diagnosis not present

## 2014-04-25 DIAGNOSIS — Z79899 Other long term (current) drug therapy: Secondary | ICD-10-CM | POA: Diagnosis not present

## 2014-04-26 DIAGNOSIS — R0902 Hypoxemia: Secondary | ICD-10-CM | POA: Diagnosis not present

## 2014-04-26 DIAGNOSIS — G4733 Obstructive sleep apnea (adult) (pediatric): Secondary | ICD-10-CM | POA: Diagnosis not present

## 2014-04-29 ENCOUNTER — Telehealth: Payer: Self-pay | Admitting: Nurse Practitioner

## 2014-04-29 ENCOUNTER — Ambulatory Visit: Payer: Medicaid Other | Admitting: Nurse Practitioner

## 2014-04-29 ENCOUNTER — Encounter: Payer: Self-pay | Admitting: Nurse Practitioner

## 2014-04-29 NOTE — Telephone Encounter (Signed)
Noted  

## 2014-04-29 NOTE — Telephone Encounter (Signed)
PATIENT WAS A NO SHOW AND LETTER WAS SENT

## 2014-05-02 DIAGNOSIS — E1121 Type 2 diabetes mellitus with diabetic nephropathy: Secondary | ICD-10-CM | POA: Diagnosis not present

## 2014-05-02 DIAGNOSIS — R809 Proteinuria, unspecified: Secondary | ICD-10-CM | POA: Diagnosis not present

## 2014-05-02 DIAGNOSIS — I10 Essential (primary) hypertension: Secondary | ICD-10-CM | POA: Diagnosis not present

## 2014-05-02 DIAGNOSIS — I509 Heart failure, unspecified: Secondary | ICD-10-CM | POA: Diagnosis not present

## 2014-05-02 DIAGNOSIS — N183 Chronic kidney disease, stage 3 (moderate): Secondary | ICD-10-CM | POA: Diagnosis not present

## 2014-05-13 ENCOUNTER — Other Ambulatory Visit: Payer: Self-pay | Admitting: Gastroenterology

## 2014-05-16 ENCOUNTER — Ambulatory Visit: Payer: Medicaid Other | Admitting: Nurse Practitioner

## 2014-05-18 ENCOUNTER — Ambulatory Visit: Payer: Medicaid Other | Admitting: Gastroenterology

## 2014-05-19 DIAGNOSIS — K210 Gastro-esophageal reflux disease with esophagitis: Secondary | ICD-10-CM | POA: Diagnosis not present

## 2014-05-19 DIAGNOSIS — I10 Essential (primary) hypertension: Secondary | ICD-10-CM | POA: Diagnosis not present

## 2014-05-19 DIAGNOSIS — E119 Type 2 diabetes mellitus without complications: Secondary | ICD-10-CM | POA: Diagnosis not present

## 2014-05-19 DIAGNOSIS — K7030 Alcoholic cirrhosis of liver without ascites: Secondary | ICD-10-CM | POA: Diagnosis not present

## 2014-05-25 DIAGNOSIS — R0902 Hypoxemia: Secondary | ICD-10-CM | POA: Diagnosis not present

## 2014-05-25 DIAGNOSIS — G4733 Obstructive sleep apnea (adult) (pediatric): Secondary | ICD-10-CM | POA: Diagnosis not present

## 2014-05-31 ENCOUNTER — Ambulatory Visit: Payer: Medicaid Other | Admitting: Nurse Practitioner

## 2014-06-02 ENCOUNTER — Encounter: Payer: Self-pay | Admitting: Nurse Practitioner

## 2014-06-02 ENCOUNTER — Ambulatory Visit (INDEPENDENT_AMBULATORY_CARE_PROVIDER_SITE_OTHER): Payer: Medicare Other | Admitting: Nurse Practitioner

## 2014-06-02 ENCOUNTER — Other Ambulatory Visit: Payer: Self-pay

## 2014-06-02 VITALS — BP 128/72 | HR 60 | Temp 97.2°F | Ht 66.0 in | Wt 265.4 lb

## 2014-06-02 DIAGNOSIS — K5900 Constipation, unspecified: Secondary | ICD-10-CM | POA: Diagnosis not present

## 2014-06-02 DIAGNOSIS — Z86010 Personal history of colonic polyps: Secondary | ICD-10-CM

## 2014-06-02 MED ORDER — PEG 3350-KCL-NA BICARB-NACL 420 G PO SOLR: 4000.0000 mL | ORAL | Status: DC

## 2014-06-02 NOTE — Progress Notes (Signed)
  Referring Provider: Hawkins, Edward, MD Primary Care Physician:  HAWKINS,EDWARD L, MD Primary GI:  Dr. Rourk  Chief Complaint  Patient presents with  . Follow-up    HPI:   64-year-old male with a history of adenomatous polyps of the colon presents for repeat recommended follow-up surveillance colonoscopy. His last 2 colonoscopies have an inadequate prep making it difficult to complete the exam. Most recent colonoscopy 04/08/2013 by Dr. Roark for history of colonic adenoma. Findings included grossly normal rectum, redundant and capacious colon, quite a bit of vegetable matter and liquid stool which precluded complete examination of the colon, one 4 mm pedunculated polyp in the midascending segment, otherwise normal colonic mucosa appeared grossly normal but a significant lesion could've been missed because of inadequate prep. Pathology of the polypectomy revealed tubular adenoma, recommended repeat exam in one year due to adenoma, history of adenoma, and poor prep.  Today he states he is not having any symptoms. Denies abdominal pain, N/V, Hematochezia, melena. Is chronically constipated Typical has a bowel movement every 3-4 days, with stool that is hard and requires straining. Is currently on Linzess which he only takes every 3 days. Denies dysphagia, fevers, chills, unintentional weight loss. Denies any other upper or lower GI symptoms  Past Medical History  Diagnosis Date  . Gout   . COPD (chronic obstructive pulmonary disease)   . DM (diabetes mellitus)   . GERD (gastroesophageal reflux disease)   . HTN (hypertension)   . Hyperlipemia   . OA (osteoarthritis)   . Gastroparesis 11/01/09  . Adenomatous colon polyp 10/30/09    serrated adenoma removed during Colonoscopy   . Diverticulosis of colon   . CRF (chronic renal failure)     Past Surgical History  Procedure Laterality Date  . Tonsillectomy    . Cataract extraction      right  . Esophagogastroduodenoscopy  10/30/09   RMR:normal   . Colonoscopy  10/30/09    RMR:left-sided diverticulum/serrated adenoma from ICV, next colonoscopy due 10/2012  . Colonoscopy N/A 09/18/2012    RMR:Colonic mucosa that was seen appeared normal, however most of it was not seen due to be very poor prep  . Colonoscopy N/A 04/08/2013    RMR:colonic polyp removed/inadequate preparation    Current Outpatient Prescriptions  Medication Sig Dispense Refill  . acetaminophen (TYLENOL) 500 MG tablet Take 1 tablet (500 mg total) by mouth every 6 (six) hours as needed for moderate pain. 30 tablet 0  . albuterol (PROAIR HFA) 108 (90 BASE) MCG/ACT inhaler Inhale 2 puffs into the lungs every 6 (six) hours as needed for wheezing or shortness of breath.     . allopurinol (ZYLOPRIM) 300 MG tablet Take 300 mg by mouth daily.      . amLODipine (NORVASC) 5 MG tablet Take 1 tablet (5 mg total) by mouth daily. 30 tablet 12  . Cholecalciferol (VITAMIN D PO) Take 1,000 Units by mouth daily.    . cloNIDine (CATAPRES) 0.2 MG tablet Take 0.2 mg by mouth 2 (two) times daily.     . colchicine 0.6 MG tablet Take 0.6 mg by mouth daily.      . furosemide (LASIX) 40 MG tablet Take 1 tablet (40 mg total) by mouth daily. 30 tablet 12  . gabapentin (NEURONTIN) 400 MG capsule Take 400 mg by mouth 2 (two) times daily. Takes at noon & bedtime    . glimepiride (AMARYL) 2 MG tablet Take 2 mg by mouth daily before breakfast.      .   HUMALOG KWIKPEN 100 UNIT/ML KiwkPen Inject 8-15 Units into the skin 3 (three) times daily before meals. Per sliding scale    . hydrALAZINE (APRESOLINE) 50 MG tablet Take 1 tablet (50 mg total) by mouth 2 (two) times daily. 60 tablet 12  . HYDROcodone-acetaminophen (NORCO/VICODIN) 5-325 MG per tablet Take 1 tablet by mouth every 6 (six) hours as needed for moderate pain.     . lanolin ointment Apply 1 application topically 2 (two) times daily as needed for dry skin.    . LANTUS SOLOSTAR 100 UNIT/ML Solostar Pen Inject 15 Units into the skin every  morning.     . LINZESS 290 MCG CAPS capsule TAKE 1 CAPSULE ONCE DAILY 30 MINUTES BEFORE MEALS 30 capsule 5  . metoprolol (LOPRESSOR) 100 MG tablet Take 100 mg by mouth 2 (two) times daily.     . omeprazole (PRILOSEC) 20 MG capsule Take 20 mg by mouth daily.    . simvastatin (ZOCOR) 10 MG tablet Take 10 mg by mouth daily.    . zolpidem (AMBIEN) 10 MG tablet Take 10 mg by mouth at bedtime.      No current facility-administered medications for this visit.    Allergies as of 06/02/2014  . (No Known Allergies)    Family History  Problem Relation Age of Onset  . Diabetes Brother     mother  . COPD Father   . Hypertension Mother   . Colon cancer Neg Hx     History   Social History  . Marital Status: Married    Spouse Name: N/A  . Number of Children: 2  . Years of Education: N/A   Occupational History  . retired    Social History Main Topics  . Smoking status: Former Smoker    Quit date: 08/26/2010  . Smokeless tobacco: Not on file  . Alcohol Use: No  . Drug Use: No  . Sexual Activity: Not on file   Other Topics Concern  . None   Social History Narrative    Review of Systems: Gen: Denies fever, chills, anorexia. Denies fatigue, weakness, weight loss.  CV: Denies chest pain, palpitations, syncope, peripheral edema, and claudication. Resp: Denies dyspnea at rest, cough, wheezing, coughing up blood, and pleurisy. GI: Denies vomiting blood, jaundice, and fecal incontinence.   Denies dysphagia or odynophagia. Derm: Denies rash, itching, dry skin Psych: Denies depression, anxiety, memory loss, confusion. No homicidal or suicidal ideation.  Heme: Denies bruising, bleeding, and enlarged lymph nodes.  Physical Exam: BP 128/72 mmHg  Pulse 60  Temp(Src) 97.2 F (36.2 C)  Ht 5' 6" (1.676 m)  Wt 265 lb 6.4 oz (120.385 kg)  BMI 42.86 kg/m2 General:   Alert and oriented. No distress noted. Pleasant and cooperative.  Head:  Normocephalic and atraumatic. Eyes:  Conjuctiva  clear without scleral icterus. Lungs:  Clear to auscultation bilaterally. No wheezes, rales, or rhonchi. No distress.  Heart:  S1, S2 present without murmurs, rubs, or gallops. Regular rate and rhythm. Abdomen:  Obese, +BS, soft, non-tender and non-distended. No rebound or guarding. No HSM or masses noted. Msk:  Symmetrical without gross deformities. Normal posture. Extremities:  With 2+ [itting edema better than baseline. Neurologic:  Alert and  oriented x4;  grossly normal neurologically. Skin:  Intact without significant lesions or rashes. Psych:  Alert and cooperative. Normal mood and affect.    06/02/2014 10:59 AM 

## 2014-06-02 NOTE — Assessment & Plan Note (Signed)
64 year old male with a history of adenomatous colon polyps with spastic colonoscopies having inadequate prep precluding appropriately full examination of his colonic mucosa making it possible that a significant lesion was missed. Schedule him for his recommend a one-year follow-up colonoscopy. This time we will monitor his prep instructions to promote inadequate prep. No red flag or warning signs symptoms including hematochezia or melena, unintentional weight loss, unexplained fever and chills, abdominal masses, excessive fatigue or weakness, and others. For his prep we'll do 2 days of clear liquids prior to the day of the procedure, 2 days before his procedure we will have him do a half dose prep, the day before his procedure do a full dose split prep. Patient to call if any problems completing his prep. Educated the patient and his wife on the importance of doing adequate prep peripheral detection of polyps in his colon. Both the patient and his wife verbalized understanding.  Proceed with TCS with Dr. Gala Romney in near future: the risks, benefits, and alternatives have been discussed with the patient in detail. The patient states understanding and desires to proceed.

## 2014-06-02 NOTE — Assessment & Plan Note (Signed)
64 year old male continues to have chronic constipation symptoms. He is ordered Linzess 145 g which he takes every 2-3 days even notes ordered daily. Encouraged patient to take his medication as ordered which is daily. Recommended adequate fiber intake in his diet. No red flag or warning signs or symptoms including hematochezia, melena, unintentional weight loss, unexplained fever or chills, decreased appetite, or change in bowel habits. See assessment and plan for history of adenomatous colon polyps further details.

## 2014-06-02 NOTE — Patient Instructions (Signed)
1. We will set up the procedure (colonoscopy) for you. 2. We will do extra prep to ensure that we can see everything we need to. 3. Clear liquids for 2 days before the procedure. The day before your procedure will do the regular prep. 2 days before your procedure will do a half prep as well. 4. Further recommendations to be based on results your procedure.

## 2014-06-02 NOTE — Progress Notes (Signed)
cc'ed to pcp °

## 2014-06-07 DIAGNOSIS — L84 Corns and callosities: Secondary | ICD-10-CM | POA: Diagnosis not present

## 2014-06-07 DIAGNOSIS — B351 Tinea unguium: Secondary | ICD-10-CM | POA: Diagnosis not present

## 2014-06-07 DIAGNOSIS — E1142 Type 2 diabetes mellitus with diabetic polyneuropathy: Secondary | ICD-10-CM | POA: Diagnosis not present

## 2014-06-09 DIAGNOSIS — E785 Hyperlipidemia, unspecified: Secondary | ICD-10-CM | POA: Diagnosis not present

## 2014-06-09 DIAGNOSIS — J449 Chronic obstructive pulmonary disease, unspecified: Secondary | ICD-10-CM | POA: Diagnosis not present

## 2014-06-09 DIAGNOSIS — E161 Other hypoglycemia: Secondary | ICD-10-CM | POA: Diagnosis not present

## 2014-06-09 DIAGNOSIS — K210 Gastro-esophageal reflux disease with esophagitis: Secondary | ICD-10-CM | POA: Diagnosis not present

## 2014-06-09 DIAGNOSIS — E1121 Type 2 diabetes mellitus with diabetic nephropathy: Secondary | ICD-10-CM | POA: Diagnosis not present

## 2014-06-09 DIAGNOSIS — I129 Hypertensive chronic kidney disease with stage 1 through stage 4 chronic kidney disease, or unspecified chronic kidney disease: Secondary | ICD-10-CM | POA: Diagnosis not present

## 2014-06-22 ENCOUNTER — Encounter (HOSPITAL_COMMUNITY): Payer: Self-pay | Admitting: *Deleted

## 2014-06-22 ENCOUNTER — Ambulatory Visit (HOSPITAL_COMMUNITY)
Admission: RE | Admit: 2014-06-22 | Discharge: 2014-06-22 | Disposition: A | Discharge status: Home / Self Care | Payer: Medicare Other | Source: Ambulatory Visit | Attending: Internal Medicine | Admitting: Internal Medicine

## 2014-06-22 ENCOUNTER — Encounter (HOSPITAL_COMMUNITY)
Admission: RE | Disposition: A | Discharge status: Home / Self Care | Payer: Self-pay | Source: Ambulatory Visit | Attending: Internal Medicine

## 2014-06-22 DIAGNOSIS — Z794 Long term (current) use of insulin: Secondary | ICD-10-CM | POA: Diagnosis not present

## 2014-06-22 DIAGNOSIS — Z5309 Procedure and treatment not carried out because of other contraindication: Secondary | ICD-10-CM

## 2014-06-22 DIAGNOSIS — E119 Type 2 diabetes mellitus without complications: Secondary | ICD-10-CM | POA: Diagnosis not present

## 2014-06-22 DIAGNOSIS — M1990 Unspecified osteoarthritis, unspecified site: Secondary | ICD-10-CM | POA: Diagnosis not present

## 2014-06-22 DIAGNOSIS — M109 Gout, unspecified: Secondary | ICD-10-CM | POA: Diagnosis not present

## 2014-06-22 DIAGNOSIS — E785 Hyperlipidemia, unspecified: Secondary | ICD-10-CM | POA: Insufficient documentation

## 2014-06-22 DIAGNOSIS — K219 Gastro-esophageal reflux disease without esophagitis: Secondary | ICD-10-CM | POA: Diagnosis not present

## 2014-06-22 DIAGNOSIS — I10 Essential (primary) hypertension: Secondary | ICD-10-CM | POA: Diagnosis not present

## 2014-06-22 DIAGNOSIS — Z87891 Personal history of nicotine dependence: Secondary | ICD-10-CM | POA: Insufficient documentation

## 2014-06-22 DIAGNOSIS — J449 Chronic obstructive pulmonary disease, unspecified: Secondary | ICD-10-CM | POA: Insufficient documentation

## 2014-06-22 DIAGNOSIS — Z860100 Personal history of colon polyps, unspecified: Secondary | ICD-10-CM | POA: Insufficient documentation

## 2014-06-22 DIAGNOSIS — N189 Chronic kidney disease, unspecified: Secondary | ICD-10-CM | POA: Diagnosis not present

## 2014-06-22 HISTORY — PX: COLONOSCOPY: SHX5424

## 2014-06-22 LAB — GLUCOSE, CAPILLARY: Glucose-Capillary: 160 mg/dL — ABNORMAL HIGH (ref 70–99)

## 2014-06-22 SURGERY — COLONOSCOPY
Anesthesia: Moderate Sedation

## 2014-06-22 MED ORDER — CLONIDINE HCL 0.2 MG PO TABS
0.2000 mg | ORAL_TABLET | Freq: Once | ORAL | Status: AC
  Administered 2014-06-22: 0.2 mg via ORAL
  Filled 2014-06-22: qty 1

## 2014-06-22 MED ORDER — MIDAZOLAM HCL 5 MG/5ML IJ SOLN
INTRAMUSCULAR | Status: AC
  Filled 2014-06-22: qty 10

## 2014-06-22 MED ORDER — SODIUM CHLORIDE 0.9 % IV SOLN
INTRAVENOUS | Status: DC
  Administered 2014-06-22: 14:00:00 via INTRAVENOUS

## 2014-06-22 MED ORDER — ONDANSETRON HCL 4 MG/2ML IJ SOLN
INTRAMUSCULAR | Status: AC
  Filled 2014-06-22: qty 2

## 2014-06-22 MED ORDER — METOPROLOL TARTRATE 50 MG PO TABS
100.0000 mg | ORAL_TABLET | Freq: Once | ORAL | Status: AC
  Administered 2014-06-22: 100 mg via ORAL
  Filled 2014-06-22: qty 1

## 2014-06-22 MED ORDER — HYDRALAZINE HCL 25 MG PO TABS
50.0000 mg | ORAL_TABLET | Freq: Once | ORAL | Status: AC
  Administered 2014-06-22: 50 mg via ORAL
  Filled 2014-06-22: qty 1

## 2014-06-22 MED ORDER — MEPERIDINE HCL 100 MG/ML IJ SOLN
INTRAMUSCULAR | Status: AC
  Filled 2014-06-22: qty 2

## 2014-06-22 MED ORDER — MEPERIDINE HCL 100 MG/ML IJ SOLN
INTRAMUSCULAR | Status: DC | PRN
  Administered 2014-06-22: 50 mg via INTRAVENOUS
  Administered 2014-06-22: 25 mg via INTRAVENOUS

## 2014-06-22 MED ORDER — MEPERIDINE HCL 100 MG/ML IJ SOLN
INTRAMUSCULAR | Status: DC
Start: 2014-06-22 — End: 2014-06-22
  Filled 2014-06-22: qty 2

## 2014-06-22 MED ORDER — LABETALOL HCL 5 MG/ML IV SOLN
INTRAVENOUS | Status: AC
  Filled 2014-06-22: qty 4

## 2014-06-22 MED ORDER — AMLODIPINE BESYLATE 5 MG PO TABS
5.0000 mg | ORAL_TABLET | Freq: Every day | ORAL | Status: AC
  Administered 2014-06-22: 5 mg via ORAL
  Filled 2014-06-22: qty 1

## 2014-06-22 MED ORDER — STERILE WATER FOR IRRIGATION IR SOLN
Status: DC | PRN
  Administered 2014-06-22: 15:00:00

## 2014-06-22 MED ORDER — LABETALOL HCL 5 MG/ML IV SOLN
10.0000 mg | Freq: Once | INTRAVENOUS | Status: AC
  Administered 2014-06-22: 10 mg via INTRAVENOUS

## 2014-06-22 MED ORDER — ONDANSETRON HCL 4 MG/2ML IJ SOLN
INTRAMUSCULAR | Status: DC | PRN
  Administered 2014-06-22: 4 mg via INTRAVENOUS

## 2014-06-22 MED ORDER — MIDAZOLAM HCL 5 MG/5ML IJ SOLN
INTRAMUSCULAR | Status: DC | PRN
  Administered 2014-06-22 (×2): 2 mg via INTRAVENOUS

## 2014-06-22 NOTE — H&P (View-Only) (Signed)
Referring Provider: Sinda Du, MD Primary Care Physician:  Alonza Bogus, MD Primary GI:  Dr. Gala Romney  Chief Complaint  Patient presents with  . Follow-up    HPI:   64 year old male with a history of adenomatous polyps of the colon presents for repeat recommended follow-up surveillance colonoscopy. His last 2 colonoscopies have an inadequate prep making it difficult to complete the exam. Most recent colonoscopy 04/08/2013 by Dr. Buford Dresser for history of colonic adenoma. Findings included grossly normal rectum, redundant and capacious colon, quite a bit of vegetable matter and liquid stool which precluded complete examination of the colon, one 4 mm pedunculated polyp in the midascending segment, otherwise normal colonic mucosa appeared grossly normal but a significant lesion could've been missed because of inadequate prep. Pathology of the polypectomy revealed tubular adenoma, recommended repeat exam in one year due to adenoma, history of adenoma, and poor prep.  Today he states he is not having any symptoms. Denies abdominal pain, N/V, Hematochezia, melena. Is chronically constipated Typical has a bowel movement every 3-4 days, with stool that is hard and requires straining. Is currently on Linzess which he only takes every 3 days. Denies dysphagia, fevers, chills, unintentional weight loss. Denies any other upper or lower GI symptoms  Past Medical History  Diagnosis Date  . Gout   . COPD (chronic obstructive pulmonary disease)   . DM (diabetes mellitus)   . GERD (gastroesophageal reflux disease)   . HTN (hypertension)   . Hyperlipemia   . OA (osteoarthritis)   . Gastroparesis 11/01/09  . Adenomatous colon polyp 10/30/09    serrated adenoma removed during Colonoscopy   . Diverticulosis of colon   . CRF (chronic renal failure)     Past Surgical History  Procedure Laterality Date  . Tonsillectomy    . Cataract extraction      right  . Esophagogastroduodenoscopy  10/30/09   [id number]   . Colonoscopy  10/30/09    [id number] diverticulum/serrated adenoma from ICV, next colonoscopy due 10/2012  . Colonoscopy N/A 09/18/2012    [id number] mucosa that was seen appeared normal, however most of it was not seen due to be very poor prep  . Colonoscopy N/A 04/08/2013    [id number] polyp removed/inadequate preparation    Current Outpatient Prescriptions  Medication Sig Dispense Refill  . acetaminophen (TYLENOL) 500 MG tablet Take 1 tablet (500 mg total) by mouth every 6 (six) hours as needed for moderate pain. 30 tablet 0  . albuterol (PROAIR HFA) 108 (90 BASE) MCG/ACT inhaler Inhale 2 puffs into the lungs every 6 (six) hours as needed for wheezing or shortness of breath.     . allopurinol (ZYLOPRIM) 300 MG tablet Take 300 mg by mouth daily.      Marland Kitchen amLODipine (NORVASC) 5 MG tablet Take 1 tablet (5 mg total) by mouth daily. 30 tablet 12  . Cholecalciferol (VITAMIN D PO) Take 1,000 Units by mouth daily.    . cloNIDine (CATAPRES) 0.2 MG tablet Take 0.2 mg by mouth 2 (two) times daily.     . colchicine 0.6 MG tablet Take 0.6 mg by mouth daily.      . furosemide (LASIX) 40 MG tablet Take 1 tablet (40 mg total) by mouth daily. 30 tablet 12  . gabapentin (NEURONTIN) 400 MG capsule Take 400 mg by mouth 2 (two) times daily. Takes at noon & bedtime    . glimepiride (AMARYL) 2 MG tablet Take 2 mg by mouth daily before breakfast.      .  HUMALOG KWIKPEN 100 UNIT/ML KiwkPen Inject 8-15 Units into the skin 3 (three) times daily before meals. Per sliding scale    . hydrALAZINE (APRESOLINE) 50 MG tablet Take 1 tablet (50 mg total) by mouth 2 (two) times daily. 60 tablet 12  . HYDROcodone-acetaminophen (NORCO/VICODIN) 5-325 MG per tablet Take 1 tablet by mouth every 6 (six) hours as needed for moderate pain.     Marland Kitchen lanolin ointment Apply 1 application topically 2 (two) times daily as needed for dry skin.    Marland Kitchen LANTUS SOLOSTAR 100 UNIT/ML Solostar Pen Inject 15 Units into the skin every  morning.     Marland Kitchen LINZESS 290 MCG CAPS capsule TAKE 1 CAPSULE ONCE DAILY 30 MINUTES BEFORE MEALS 30 capsule 5  . metoprolol (LOPRESSOR) 100 MG tablet Take 100 mg by mouth 2 (two) times daily.     Marland Kitchen omeprazole (PRILOSEC) 20 MG capsule Take 20 mg by mouth daily.    . simvastatin (ZOCOR) 10 MG tablet Take 10 mg by mouth daily.    Marland Kitchen zolpidem (AMBIEN) 10 MG tablet Take 10 mg by mouth at bedtime.      No current facility-administered medications for this visit.    Allergies as of 06/02/2014  . (No Known Allergies)    Family History  Problem Relation Age of Onset  . Diabetes Brother     mother  . COPD Father   . Hypertension Mother   . Colon cancer Neg Hx     History   Social History  . Marital Status: Married    Spouse Name: N/A  . Number of Children: 2  . Years of Education: N/A   Occupational History  . retired    Social History Main Topics  . Smoking status: Former Smoker    Quit date: 08/26/2010  . Smokeless tobacco: Not on file  . Alcohol Use: No  . Drug Use: No  . Sexual Activity: Not on file   Other Topics Concern  . None   Social History Narrative    Review of Systems: Gen: Denies fever, chills, anorexia. Denies fatigue, weakness, weight loss.  CV: Denies chest pain, palpitations, syncope, peripheral edema, and claudication. Resp: Denies dyspnea at rest, cough, wheezing, coughing up blood, and pleurisy. GI: Denies vomiting blood, jaundice, and fecal incontinence.   Denies dysphagia or odynophagia. Derm: Denies rash, itching, dry skin Psych: Denies depression, anxiety, memory loss, confusion. No homicidal or suicidal ideation.  Heme: Denies bruising, bleeding, and enlarged lymph nodes.  Physical Exam: BP 128/72 mmHg  Pulse 60  Temp(Src) 97.2 F (36.2 C)  Ht 5' 6"  (1.676 m)  Wt 265 lb 6.4 oz (120.385 kg)  BMI 42.86 kg/m2 General:   Alert and oriented. No distress noted. Pleasant and cooperative.  Head:  Normocephalic and atraumatic. Eyes:  Conjuctiva  clear without scleral icterus. Lungs:  Clear to auscultation bilaterally. No wheezes, rales, or rhonchi. No distress.  Heart:  S1, S2 present without murmurs, rubs, or gallops. Regular rate and rhythm. Abdomen:  Obese, +BS, soft, non-tender and non-distended. No rebound or guarding. No HSM or masses noted. Msk:  Symmetrical without gross deformities. Normal posture. Extremities:  With 2+ [itting edema better than baseline. Neurologic:  Alert and  oriented x4;  grossly normal neurologically. Skin:  Intact without significant lesions or rashes. Psych:  Alert and cooperative. Normal mood and affect.    06/02/2014 10:59 AM

## 2014-06-22 NOTE — Discharge Instructions (Addendum)
°  Colonoscopy Discharge Instructions  Read the instructions outlined below and refer to this sheet in the next few weeks. These discharge instructions provide you with general information on caring for yourself after you leave the hospital. Your doctor may also give you specific instructions. While your treatment has been planned according to the most current medical practices available, unavoidable complications occasionally occur. If you have any problems or questions after discharge, call Dr. Gala Romney at [phone]. ACTIVITY  You may resume your regular activity, but move at a slower pace for the next 24 hours.   Take frequent rest periods for the next 24 hours.   Walking will help get rid of the air and reduce the bloated feeling in your belly (abdomen).   No driving for 24 hours (because of the medicine (anesthesia) used during the test).    Do not sign any important legal documents or operate any machinery for 24 hours (because of the anesthesia used during the test).  NUTRITION  Drink plenty of fluids.   You may resume your normal diet as instructed by your doctor.   Begin with a light meal and progress to your normal diet. Heavy or fried foods are harder to digest and may make you feel sick to your stomach (nauseated).   Avoid alcoholic beverages for 24 hours or as instructed.  MEDICATIONS  You may resume your normal medications unless your doctor tells you otherwise.  WHAT YOU CAN EXPECT TODAY  Some feelings of bloating in the abdomen.   Passage of more gas than usual.   Spotting of blood in your stool or on the toilet paper.  IF YOU HAD POLYPS REMOVED DURING THE COLONOSCOPY:  No aspirin products for 7 days or as instructed.   No alcohol for 7 days or as instructed.   Eat a soft diet for the next 24 hours.  FINDING OUT THE RESULTS OF YOUR TEST Not all test results are available during your visit. If your test results are not back during the visit, make an appointment  with your caregiver to find out the results. Do not assume everything is normal if you have not heard from your caregiver or the medical facility. It is important for you to follow up on all of your test results.  SEEK IMMEDIATE MEDICAL ATTENTION IF:  You have more than a spotting of blood in your stool.   Your belly is swollen (abdominal distention).   You are nauseated or vomiting.   You have a temperature over 101.   You have abdominal pain or discomfort that is severe or gets worse throughout the day.    Your colon preparation today was again inadequate to complete the examination  Blood pressure was elevated today. He need to see Dr. Luan Pulling later and have your blood pressure rechecked  It will probably best to go to the [facility] to have your next colonoscopy. Perhaps they can get you cleaned out adequately to complete the examination

## 2014-06-22 NOTE — Interval H&P Note (Signed)
History and Physical Interval Note:  06/22/2014 2:42 PM  Frank Hoover  has presented today for surgery, with the diagnosis of history of polyps  The various methods of treatment have been discussed with the patient and family. After consideration of risks, benefits and other options for treatment, the patient has consented to  Procedure(s) with comments: COLONOSCOPY (N/A) - 115  as a surgical intervention .  The patient's history has been reviewed, patient examined, no change in status, stable for surgery.  I have reviewed the patient's chart and labs.  Questions were answered to the patient's satisfaction.     Frank Hoover  No change. Colonoscopy per plan.The risks, benefits, limitations, alternatives and imponderables have been reviewed with the patient. Questions have been answered. All parties are agreeable.

## 2014-06-22 NOTE — Op Note (Signed)
[facility] [address] [place], [address]   COLONOSCOPY PROCEDURE REPORT  PATIENT: Frank, Hoover  MR#: [id number] BIRTHDATE: 04-27-1950 , 89  yrs. old GENDER: male ENDOSCOPIST: R.  Garfield Cornea, MD FACP [facility] REFERRED [id number] Luan Pulling, M.D. PROCEDURE DATE:  02-Jul-2014 PROCEDURE:   Colonoscopy, surveillance  (incomplete) INDICATIONS:History of colonic adenoma; history of inadequate colonoscopy preparations previously. MEDICATIONS: Versed 4 mg IV and Demerol 75 mg IV in divided doses. Zofran 4 mg IV. ASA CLASS:       Class III  CONSENT: The risks, benefits, alternatives and imponderables including but not limited to bleeding, perforation as well as the possibility of a missed lesion have been reviewed.  The potential for biopsy, lesion removal, etc. have also been discussed. Questions have been answered.  All parties agreeable.  Please see the history and physical in the medical record for more information.  DESCRIPTION OF PROCEDURE:   After the risks benefits and alternatives of the procedure were thoroughly explained, informed consent was obtained.  The digital rectal exam revealed no abnormalities of the rectum.   The [id number]  endoscope was introduced through the anus and advanced to the distal transverse colon. No adverse events experienced.   The quality of the prep was poor.  The instrument was then slowly withdrawn as the colon reexamined.    Prep poor in the rectum and sigmoid. Viscous stool present?"precluded complete examination of the colon./  made to examination not doable. I advanced the scope on up into the descending segment reaching  the distal transverse segment (prep in the descendingg segment looked a little better.Marland Kitchen  However the transverse segment was notyed to be grossly unprepped. This precluded attemptingcompletion of examination.       Retroflexion was not performed. .  Withdrawal time=not applicable     .  The  scope was withdrawn and the procedure completed. COMPLICATIONS: There were no immediate complications.  ENDOSCOPIC IMPRESSION: Incomplete colonoscopy. Inadequate preparation precluded complete examination.  blood pressure significantly elevated today.  RECOMMENDATIONS: patient advised tofollow up with  Dr. Luan Pulling regarding blood pressure this week. I feel the patient would be best served by going over to the [facility] for them to attempt completing a colonoscopy with adequate preparation.  eSigned:  R. Garfield Cornea, MD Rosalita Chessman [facility] 02-Jul-2014 3:12 PM   cc:  CPT CODES: ICD CODES:  The ICD and CPT codes recommended by this software are interpretations from the data that the clinical staff has captured with the software.  The verification of the translation of this report to the ICD and CPT codes and modifiers is the sole responsibility of the health care institution and practicing physician where this report was generated.  [employer]. will not be held responsible for the validity of the ICD and CPT codes included on this report.  AMA assumes no liability for data contained or not contained herein. CPT is a Designer, television/film set of the [employer].

## 2014-06-22 NOTE — Progress Notes (Signed)
Colonoscopy incomplete due to poor prep.

## 2014-06-22 NOTE — Progress Notes (Signed)
Pt came in for pre-op to have colonoscopy done with Dr. Gala Romney. BP 220/104 in right arm, 209/97 in left arm. Pt said he only took one of his 4 blood pressure pills but he didn't know which one it was. Dr. Gala Romney notified. Order given to give all of pt's home BP medications x 1 time: Metoprolol 100mg  PO, Clondine 0.2mg  PO, Hydralazine 50mg  PO and Amlodipine 5mg  PO. Order initiated. Dr. Gala Romney said he would postpone Colonoscopy for 1 hour depending on how BP comes down. Will continue to monitor pt.

## 2014-06-23 DIAGNOSIS — I10 Essential (primary) hypertension: Secondary | ICD-10-CM | POA: Diagnosis not present

## 2014-06-24 ENCOUNTER — Encounter (HOSPITAL_COMMUNITY): Payer: Self-pay | Admitting: Internal Medicine

## 2014-06-25 DIAGNOSIS — G4733 Obstructive sleep apnea (adult) (pediatric): Secondary | ICD-10-CM | POA: Diagnosis not present

## 2014-06-25 DIAGNOSIS — R0902 Hypoxemia: Secondary | ICD-10-CM | POA: Diagnosis not present

## 2014-07-04 DIAGNOSIS — E119 Type 2 diabetes mellitus without complications: Secondary | ICD-10-CM | POA: Diagnosis not present

## 2014-07-04 DIAGNOSIS — I998 Other disorder of circulatory system: Secondary | ICD-10-CM | POA: Diagnosis not present

## 2014-07-13 DIAGNOSIS — G4733 Obstructive sleep apnea (adult) (pediatric): Secondary | ICD-10-CM | POA: Diagnosis not present

## 2014-07-25 DIAGNOSIS — G4733 Obstructive sleep apnea (adult) (pediatric): Secondary | ICD-10-CM | POA: Diagnosis not present

## 2014-07-25 DIAGNOSIS — R0902 Hypoxemia: Secondary | ICD-10-CM | POA: Diagnosis not present

## 2014-07-26 DIAGNOSIS — E119 Type 2 diabetes mellitus without complications: Secondary | ICD-10-CM | POA: Diagnosis not present

## 2014-07-26 DIAGNOSIS — H26491 Other secondary cataract, right eye: Secondary | ICD-10-CM | POA: Diagnosis not present

## 2014-07-26 DIAGNOSIS — Z961 Presence of intraocular lens: Secondary | ICD-10-CM | POA: Diagnosis not present

## 2014-08-16 DIAGNOSIS — E1142 Type 2 diabetes mellitus with diabetic polyneuropathy: Secondary | ICD-10-CM | POA: Diagnosis not present

## 2014-08-16 DIAGNOSIS — B351 Tinea unguium: Secondary | ICD-10-CM | POA: Diagnosis not present

## 2014-08-16 DIAGNOSIS — L84 Corns and callosities: Secondary | ICD-10-CM | POA: Diagnosis not present

## 2014-08-25 DIAGNOSIS — R0902 Hypoxemia: Secondary | ICD-10-CM | POA: Diagnosis not present

## 2014-08-25 DIAGNOSIS — G4733 Obstructive sleep apnea (adult) (pediatric): Secondary | ICD-10-CM | POA: Diagnosis not present

## 2014-08-31 DIAGNOSIS — E1121 Type 2 diabetes mellitus with diabetic nephropathy: Secondary | ICD-10-CM | POA: Diagnosis not present

## 2014-08-31 DIAGNOSIS — I110 Hypertensive heart disease with heart failure: Secondary | ICD-10-CM | POA: Diagnosis not present

## 2014-08-31 DIAGNOSIS — G4733 Obstructive sleep apnea (adult) (pediatric): Secondary | ICD-10-CM | POA: Diagnosis not present

## 2014-08-31 DIAGNOSIS — J449 Chronic obstructive pulmonary disease, unspecified: Secondary | ICD-10-CM | POA: Diagnosis not present

## 2014-08-31 DIAGNOSIS — G4730 Sleep apnea, unspecified: Secondary | ICD-10-CM | POA: Diagnosis not present

## 2014-09-24 DIAGNOSIS — R0902 Hypoxemia: Secondary | ICD-10-CM | POA: Diagnosis not present

## 2014-09-24 DIAGNOSIS — G4733 Obstructive sleep apnea (adult) (pediatric): Secondary | ICD-10-CM | POA: Diagnosis not present

## 2014-10-25 DIAGNOSIS — E1142 Type 2 diabetes mellitus with diabetic polyneuropathy: Secondary | ICD-10-CM | POA: Diagnosis not present

## 2014-10-25 DIAGNOSIS — G4733 Obstructive sleep apnea (adult) (pediatric): Secondary | ICD-10-CM | POA: Diagnosis not present

## 2014-10-25 DIAGNOSIS — B351 Tinea unguium: Secondary | ICD-10-CM | POA: Diagnosis not present

## 2014-10-25 DIAGNOSIS — L84 Corns and callosities: Secondary | ICD-10-CM | POA: Diagnosis not present

## 2014-10-25 DIAGNOSIS — R0902 Hypoxemia: Secondary | ICD-10-CM | POA: Diagnosis not present

## 2014-11-08 ENCOUNTER — Other Ambulatory Visit: Payer: Self-pay

## 2014-11-09 MED ORDER — LINACLOTIDE 290 MCG PO CAPS: ORAL_CAPSULE | ORAL | Status: DC

## 2014-11-15 DIAGNOSIS — E1129 Type 2 diabetes mellitus with other diabetic kidney complication: Secondary | ICD-10-CM | POA: Diagnosis not present

## 2014-11-15 DIAGNOSIS — E559 Vitamin D deficiency, unspecified: Secondary | ICD-10-CM | POA: Diagnosis not present

## 2014-11-15 DIAGNOSIS — I10 Essential (primary) hypertension: Secondary | ICD-10-CM | POA: Diagnosis not present

## 2014-11-15 DIAGNOSIS — D631 Anemia in chronic kidney disease: Secondary | ICD-10-CM | POA: Diagnosis not present

## 2014-11-15 DIAGNOSIS — N184 Chronic kidney disease, stage 4 (severe): Secondary | ICD-10-CM | POA: Diagnosis not present

## 2014-11-16 DIAGNOSIS — I10 Essential (primary) hypertension: Secondary | ICD-10-CM | POA: Diagnosis not present

## 2014-11-16 DIAGNOSIS — N183 Chronic kidney disease, stage 3 (moderate): Secondary | ICD-10-CM | POA: Diagnosis not present

## 2014-11-16 DIAGNOSIS — R809 Proteinuria, unspecified: Secondary | ICD-10-CM | POA: Diagnosis not present

## 2014-11-16 DIAGNOSIS — I509 Heart failure, unspecified: Secondary | ICD-10-CM | POA: Diagnosis not present

## 2014-11-22 DIAGNOSIS — E161 Other hypoglycemia: Secondary | ICD-10-CM | POA: Diagnosis not present

## 2014-11-25 DIAGNOSIS — R0902 Hypoxemia: Secondary | ICD-10-CM | POA: Diagnosis not present

## 2014-11-25 DIAGNOSIS — G4733 Obstructive sleep apnea (adult) (pediatric): Secondary | ICD-10-CM | POA: Diagnosis not present

## 2014-12-07 DIAGNOSIS — G4733 Obstructive sleep apnea (adult) (pediatric): Secondary | ICD-10-CM | POA: Diagnosis not present

## 2014-12-07 DIAGNOSIS — I10 Essential (primary) hypertension: Secondary | ICD-10-CM | POA: Diagnosis not present

## 2014-12-07 DIAGNOSIS — J45909 Unspecified asthma, uncomplicated: Secondary | ICD-10-CM | POA: Diagnosis not present

## 2014-12-25 DIAGNOSIS — G4733 Obstructive sleep apnea (adult) (pediatric): Secondary | ICD-10-CM | POA: Diagnosis not present

## 2014-12-25 DIAGNOSIS — R0902 Hypoxemia: Secondary | ICD-10-CM | POA: Diagnosis not present

## 2015-01-02 DIAGNOSIS — E1140 Type 2 diabetes mellitus with diabetic neuropathy, unspecified: Secondary | ICD-10-CM | POA: Diagnosis not present

## 2015-01-02 DIAGNOSIS — Z23 Encounter for immunization: Secondary | ICD-10-CM | POA: Diagnosis not present

## 2015-01-02 DIAGNOSIS — E1121 Type 2 diabetes mellitus with diabetic nephropathy: Secondary | ICD-10-CM | POA: Diagnosis not present

## 2015-01-02 DIAGNOSIS — G4730 Sleep apnea, unspecified: Secondary | ICD-10-CM | POA: Diagnosis not present

## 2015-01-02 DIAGNOSIS — J449 Chronic obstructive pulmonary disease, unspecified: Secondary | ICD-10-CM | POA: Diagnosis not present

## 2015-01-24 DIAGNOSIS — L84 Corns and callosities: Secondary | ICD-10-CM | POA: Diagnosis not present

## 2015-01-24 DIAGNOSIS — E1142 Type 2 diabetes mellitus with diabetic polyneuropathy: Secondary | ICD-10-CM | POA: Diagnosis not present

## 2015-01-24 DIAGNOSIS — B351 Tinea unguium: Secondary | ICD-10-CM | POA: Diagnosis not present

## 2015-01-25 DIAGNOSIS — R0902 Hypoxemia: Secondary | ICD-10-CM | POA: Diagnosis not present

## 2015-01-25 DIAGNOSIS — G4733 Obstructive sleep apnea (adult) (pediatric): Secondary | ICD-10-CM | POA: Diagnosis not present

## 2015-02-01 DIAGNOSIS — J449 Chronic obstructive pulmonary disease, unspecified: Secondary | ICD-10-CM | POA: Diagnosis not present

## 2015-02-01 DIAGNOSIS — G4730 Sleep apnea, unspecified: Secondary | ICD-10-CM | POA: Diagnosis not present

## 2015-02-01 DIAGNOSIS — E1140 Type 2 diabetes mellitus with diabetic neuropathy, unspecified: Secondary | ICD-10-CM | POA: Diagnosis not present

## 2015-02-01 DIAGNOSIS — E1121 Type 2 diabetes mellitus with diabetic nephropathy: Secondary | ICD-10-CM | POA: Diagnosis not present

## 2015-02-01 DIAGNOSIS — Z125 Encounter for screening for malignant neoplasm of prostate: Secondary | ICD-10-CM | POA: Diagnosis not present

## 2015-02-01 DIAGNOSIS — I110 Hypertensive heart disease with heart failure: Secondary | ICD-10-CM | POA: Diagnosis not present

## 2015-02-24 DIAGNOSIS — R0902 Hypoxemia: Secondary | ICD-10-CM | POA: Diagnosis not present

## 2015-02-24 DIAGNOSIS — G4733 Obstructive sleep apnea (adult) (pediatric): Secondary | ICD-10-CM | POA: Diagnosis not present

## 2015-03-24 DIAGNOSIS — E559 Vitamin D deficiency, unspecified: Secondary | ICD-10-CM | POA: Diagnosis not present

## 2015-03-24 DIAGNOSIS — D509 Iron deficiency anemia, unspecified: Secondary | ICD-10-CM | POA: Diagnosis not present

## 2015-03-24 DIAGNOSIS — I10 Essential (primary) hypertension: Secondary | ICD-10-CM | POA: Diagnosis not present

## 2015-03-24 DIAGNOSIS — D631 Anemia in chronic kidney disease: Secondary | ICD-10-CM | POA: Diagnosis not present

## 2015-03-24 DIAGNOSIS — E1129 Type 2 diabetes mellitus with other diabetic kidney complication: Secondary | ICD-10-CM | POA: Diagnosis not present

## 2015-03-27 DIAGNOSIS — G4733 Obstructive sleep apnea (adult) (pediatric): Secondary | ICD-10-CM | POA: Diagnosis not present

## 2015-03-27 DIAGNOSIS — R0902 Hypoxemia: Secondary | ICD-10-CM | POA: Diagnosis not present

## 2015-03-29 DIAGNOSIS — E559 Vitamin D deficiency, unspecified: Secondary | ICD-10-CM | POA: Diagnosis not present

## 2015-03-29 DIAGNOSIS — N183 Chronic kidney disease, stage 3 (moderate): Secondary | ICD-10-CM | POA: Diagnosis not present

## 2015-03-29 DIAGNOSIS — R809 Proteinuria, unspecified: Secondary | ICD-10-CM | POA: Diagnosis not present

## 2015-03-29 DIAGNOSIS — D638 Anemia in other chronic diseases classified elsewhere: Secondary | ICD-10-CM | POA: Diagnosis not present

## 2015-04-04 DIAGNOSIS — J449 Chronic obstructive pulmonary disease, unspecified: Secondary | ICD-10-CM | POA: Diagnosis not present

## 2015-04-04 DIAGNOSIS — E1121 Type 2 diabetes mellitus with diabetic nephropathy: Secondary | ICD-10-CM | POA: Diagnosis not present

## 2015-04-04 DIAGNOSIS — I110 Hypertensive heart disease with heart failure: Secondary | ICD-10-CM | POA: Diagnosis not present

## 2015-04-27 DIAGNOSIS — G4733 Obstructive sleep apnea (adult) (pediatric): Secondary | ICD-10-CM | POA: Diagnosis not present

## 2015-04-27 DIAGNOSIS — R0902 Hypoxemia: Secondary | ICD-10-CM | POA: Diagnosis not present

## 2015-04-29 DIAGNOSIS — R739 Hyperglycemia, unspecified: Secondary | ICD-10-CM | POA: Diagnosis not present

## 2015-04-29 DIAGNOSIS — R7309 Other abnormal glucose: Secondary | ICD-10-CM | POA: Diagnosis not present

## 2015-05-05 ENCOUNTER — Other Ambulatory Visit: Payer: Self-pay | Admitting: Nurse Practitioner

## 2015-05-25 DIAGNOSIS — R0902 Hypoxemia: Secondary | ICD-10-CM | POA: Diagnosis not present

## 2015-05-25 DIAGNOSIS — G4733 Obstructive sleep apnea (adult) (pediatric): Secondary | ICD-10-CM | POA: Diagnosis not present

## 2015-06-16 ENCOUNTER — Emergency Department (HOSPITAL_COMMUNITY)
Admission: EM | Admit: 2015-06-16 | Discharge: 2015-06-16 | Disposition: A | Discharge status: Home / Self Care | Payer: Medicare Other | Attending: Emergency Medicine | Admitting: Emergency Medicine

## 2015-06-16 ENCOUNTER — Encounter (HOSPITAL_COMMUNITY): Payer: Self-pay | Admitting: Emergency Medicine

## 2015-06-16 DIAGNOSIS — E162 Hypoglycemia, unspecified: Secondary | ICD-10-CM

## 2015-06-16 DIAGNOSIS — Z794 Long term (current) use of insulin: Secondary | ICD-10-CM | POA: Insufficient documentation

## 2015-06-16 DIAGNOSIS — Z79899 Other long term (current) drug therapy: Secondary | ICD-10-CM | POA: Diagnosis not present

## 2015-06-16 DIAGNOSIS — N189 Chronic kidney disease, unspecified: Secondary | ICD-10-CM | POA: Diagnosis not present

## 2015-06-16 DIAGNOSIS — I129 Hypertensive chronic kidney disease with stage 1 through stage 4 chronic kidney disease, or unspecified chronic kidney disease: Secondary | ICD-10-CM | POA: Diagnosis not present

## 2015-06-16 DIAGNOSIS — E161 Other hypoglycemia: Secondary | ICD-10-CM | POA: Diagnosis not present

## 2015-06-16 DIAGNOSIS — Z7984 Long term (current) use of oral hypoglycemic drugs: Secondary | ICD-10-CM | POA: Insufficient documentation

## 2015-06-16 DIAGNOSIS — J449 Chronic obstructive pulmonary disease, unspecified: Secondary | ICD-10-CM | POA: Diagnosis not present

## 2015-06-16 DIAGNOSIS — Z87891 Personal history of nicotine dependence: Secondary | ICD-10-CM | POA: Insufficient documentation

## 2015-06-16 DIAGNOSIS — R7309 Other abnormal glucose: Secondary | ICD-10-CM | POA: Diagnosis not present

## 2015-06-16 DIAGNOSIS — E11649 Type 2 diabetes mellitus with hypoglycemia without coma: Secondary | ICD-10-CM | POA: Diagnosis not present

## 2015-06-16 DIAGNOSIS — M1990 Unspecified osteoarthritis, unspecified site: Secondary | ICD-10-CM | POA: Insufficient documentation

## 2015-06-16 LAB — CBG MONITORING, ED: Glucose-Capillary: 137 mg/dL — ABNORMAL HIGH (ref 65–99)

## 2015-06-16 NOTE — ED Notes (Signed)
Pt called EMS for hypoglycemia. Pt drank mountain dew and ate peanut butter sandwich pta.

## 2015-06-16 NOTE — ED Notes (Signed)
Meal tray ordered 

## 2015-06-16 NOTE — Discharge Instructions (Signed)
Hypoglycemia Low blood sugar (hypoglycemia) means that the level of sugar in your blood is lower than it should be. Signs of low blood sugar include:  Getting sweaty.  Feeling hungry.  Feeling dizzy or weak.  Feeling sleepier than normal.  Feeling nervous.  Headaches.  Having a fast heartbeat. Low blood sugar can happen fast and can be an emergency. Your doctor can do tests to check your blood sugar level. You can have low blood sugar and not have diabetes. HOME CARE  Check your blood sugar as told by your doctor. If it is less than 70 mg/dl or as told by your doctor, take 1 of the following:  3 to 4 glucose tablets.   cup clear juice.   cup soda pop, not diet.  1 cup milk.  5 to 6 hard candies.  Recheck blood sugar after 15 minutes. Repeat until it is at the right level.  Eat a snack if it is more than 1 hour until the next meal.  Only take medicine as told by your doctor.  Do not skip meals. Eat on time.  Do not drink alcohol except with meals.  Check your blood glucose before driving.  Check your blood glucose before and after exercise.  Always carry treatment with you, such as glucose pills.  Always wear a medical alert bracelet if you have diabetes. GET HELP RIGHT AWAY IF:   Your blood glucose goes below 70 mg/dl or as told by your doctor, and you:  Are confused.  Are not able to swallow.  Pass out (faint).  You cannot treat yourself. You may need someone to help you.  You have low blood sugar problems often.  You have problems from your medicines.  You are not feeling better after 3 to 4 days.  You have vision changes. MAKE SURE YOU:   Understand these instructions.  Will watch this condition.  Will get help right away if you are not doing well or get worse.   This information is not intended to replace advice given to you by your health care provider. Make sure you discuss any questions you have with your health care provider.     Document Released: 05/15/2009 Document Revised: 03/11/2014 Document Reviewed: 10/25/2014 Elsevier Interactive Patient Education 2016 [place] your sugar closely the next couple of days. If your trend is reading lower,  you will need to back off the amount of insulin. Follow-up your primary care doctor

## 2015-06-16 NOTE — ED Notes (Signed)
Patient given lunch tray. Alert/oriented. Eating without difficulty.

## 2015-06-16 NOTE — ED Notes (Signed)
Pt checked blood glucose with home meter upon d/c. 335. Pt instructed to keep track of blood glucose levels and talk with pcp about sliding scale and insulin dosage. nad noted.

## 2015-06-16 NOTE — ED Provider Notes (Signed)
CSN: [id number]     Arrival date & time 06/16/15  1128 History   First MD Initiated Contact with Patient 06/16/15 1211     Chief Complaint  Patient presents with  . Hypoglycemia     (Consider location/radiation/quality/duration/timing/severity/associated sxs/prior Treatment) HPI..... Patient with known diabetes presents with a low blood sugar today. Glucose was measured at 53 at home. He then drank soda and ate some candy. Sugar is now improved. He feels he is now back to normal. He takes both insulin and oral hypoglycemics. No prodromal illnesses.  Past Medical History  Diagnosis Date  . Gout   . COPD (chronic obstructive pulmonary disease) ([place])   . DM (diabetes mellitus) ([place])   . GERD (gastroesophageal reflux disease)   . HTN (hypertension)   . Hyperlipemia   . OA (osteoarthritis)   . Gastroparesis 11/01/09  . Adenomatous colon polyp 10/30/09    serrated adenoma removed during Colonoscopy   . Diverticulosis of colon   . CRF (chronic renal failure)    Past Surgical History  Procedure Laterality Date  . Tonsillectomy    . Cataract extraction      right  . Esophagogastroduodenoscopy  10/30/09    [id number]   . Colonoscopy  10/30/09    [id number] diverticulum/serrated adenoma from ICV, next colonoscopy due 10/2012  . Colonoscopy N/A 09/18/2012    [id number] mucosa that was seen appeared normal, however most of it was not seen due to be very poor prep  . Colonoscopy N/A 04/08/2013    [id number] polyp removed/inadequate preparation  . Colonoscopy N/A 06/22/2014    Procedure: COLONOSCOPY;  Surgeon: Daneil Dolin, MD;  Location: [facility];  Service: Endoscopy;  Laterality: N/A;  115    Family History  Problem Relation Age of Onset  . Diabetes Brother     mother  . COPD Father   . Hypertension Mother   . Colon cancer Neg Hx    Social History  Substance Use Topics  . Smoking status: Former Smoker -- 1.00 packs/day for 4 years    Types: Cigarettes    Quit date:  08/26/2010  . Smokeless tobacco: None  . Alcohol Use: No    Review of Systems    Allergies  Review of patient's allergies indicates no known allergies.  Home Medications   Prior to Admission medications   Medication Sig Start Date End Date Taking? Authorizing Provider  allopurinol (ZYLOPRIM) 300 MG tablet Take 300 mg by mouth daily.     Yes Historical Provider, MD  amLODipine (NORVASC) 10 MG tablet Take 10 mg by mouth daily. 06/06/15  Yes Historical Provider, MD  Cholecalciferol (VITAMIN D PO) Take 1,000 Units by mouth daily.   Yes Historical Provider, MD  cloNIDine (CATAPRES) 0.2 MG tablet Take 0.2 mg by mouth 2 (two) times daily.    Yes Historical Provider, MD  furosemide (LASIX) 40 MG tablet Take 1 tablet (40 mg total) by mouth daily. 12/05/13  Yes Sinda Du, MD  gabapentin (NEURONTIN) 400 MG capsule Take 400 mg by mouth 2 (two) times daily. Takes at noon & bedtime   Yes Historical Provider, MD  glimepiride (AMARYL) 2 MG tablet Take 2 mg by mouth daily before breakfast.     Yes Historical Provider, MD  HUMALOG KWIKPEN 100 UNIT/ML KiwkPen Inject 8-15 Units into the skin 3 (three) times daily before meals. Per sliding scale 02/06/13  Yes Historical Provider, MD  hydrALAZINE (APRESOLINE) 50 MG tablet Take 1 tablet (50 mg total) by mouth 2 (  two) times daily. 12/05/13  Yes Sinda Du, MD  HYDROcodone-acetaminophen (NORCO/VICODIN) 5-325 MG per tablet Take 1 tablet by mouth every 6 (six) hours as needed for moderate pain.    Yes Historical Provider, MD  lanolin ointment Apply 1 application topically 2 (two) times daily as needed for dry skin.   Yes Historical Provider, MD  LANTUS SOLOSTAR 100 UNIT/ML Solostar Pen Inject 15 Units into the skin every morning.  12/23/12  Yes Historical Provider, MD  LINZESS 290 MCG CAPS capsule TAKE 1 CAPSULE ONCE DAILY 30 MINUTES BEFORE MEALS 05/08/15  Yes Mahala Menghini, PA-C  metoprolol (LOPRESSOR) 100 MG tablet Take 100 mg by mouth 2 (two) times daily.   03/14/12  Yes Historical Provider, MD  omeprazole (PRILOSEC) 20 MG capsule Take 20 mg by mouth daily.   Yes Historical Provider, MD  simvastatin (ZOCOR) 10 MG tablet Take 10 mg by mouth daily.   Yes Historical Provider, MD  TOUJEO SOLOSTAR 300 UNIT/ML SOPN Inject 15 Units into the skin at bedtime. 06/14/15  Yes Historical Provider, MD  zolpidem (AMBIEN) 10 MG tablet Take 10 mg by mouth at bedtime.    Yes Historical Provider, MD  acetaminophen (TYLENOL) 500 MG tablet Take 1 tablet (500 mg total) by mouth every 6 (six) hours as needed for moderate pain. 12/05/13   Sinda Du, MD  albuterol (PROAIR HFA) 108 (90 BASE) MCG/ACT inhaler Inhale 2 puffs into the lungs every 6 (six) hours as needed for wheezing or shortness of breath.     Historical Provider, MD  amLODipine (NORVASC) 5 MG tablet Take 1 tablet (5 mg total) by mouth daily. Patient not taking: Reported on 06/16/2015 12/05/13   Sinda Du, MD  polyethylene glycol-electrolytes (TRILYTE) 420 G solution Take 4,000 mLs by mouth as directed. Patient not taking: Reported on 06/16/2015 06/02/14   Daneil Dolin, MD   BP 120/69 mmHg  Pulse 85  Temp(Src) 98.4 F (36.9 C)  Resp 20  Ht 5\' 6"  (1.676 m)  Wt 262 lb (118.842 kg)  BMI 42.31 kg/m2  SpO2 97% Physical Exam  Constitutional: He is oriented to person, place, and time. He appears well-developed and well-nourished.  Obese  HENT:  Head: Normocephalic and atraumatic.  Eyes: Conjunctivae and EOM are normal. Pupils are equal, round, and reactive to light.  Neck: Normal range of motion. Neck supple.  Cardiovascular: Normal rate and regular rhythm.   Pulmonary/Chest: Effort normal and breath sounds normal.  Abdominal: Soft. Bowel sounds are normal.  Musculoskeletal: Normal range of motion.  Neurological: He is alert and oriented to person, place, and time.  Skin: Skin is warm and dry.  Psychiatric: He has a normal mood and affect. His behavior is normal.  Nursing note and vitals  reviewed.   ED Course  Procedures (including critical care time) Labs Review Labs Reviewed  CBG MONITORING, ED - Abnormal; Notable for the following:    Glucose-Capillary 137 (*)    All other components within normal limits    Imaging Review No results found. I have personally reviewed and evaluated these images and lab results as part of my medical decision-making.   EKG Interpretation None      MDM   Final diagnoses:  Hypoglycemia    Patient observed for approximately 1 hour. He is alert and oriented 3. His glucose has normalized. Discussed findings with the patient and his family    Nat Christen, MD 06/16/15 2216

## 2015-06-25 DIAGNOSIS — R0902 Hypoxemia: Secondary | ICD-10-CM | POA: Diagnosis not present

## 2015-06-25 DIAGNOSIS — G4733 Obstructive sleep apnea (adult) (pediatric): Secondary | ICD-10-CM | POA: Diagnosis not present

## 2015-06-27 DIAGNOSIS — E1142 Type 2 diabetes mellitus with diabetic polyneuropathy: Secondary | ICD-10-CM | POA: Diagnosis not present

## 2015-06-27 DIAGNOSIS — L84 Corns and callosities: Secondary | ICD-10-CM | POA: Diagnosis not present

## 2015-06-27 DIAGNOSIS — B351 Tinea unguium: Secondary | ICD-10-CM | POA: Diagnosis not present

## 2015-07-11 DIAGNOSIS — D631 Anemia in chronic kidney disease: Secondary | ICD-10-CM | POA: Diagnosis not present

## 2015-07-11 DIAGNOSIS — E1121 Type 2 diabetes mellitus with diabetic nephropathy: Secondary | ICD-10-CM | POA: Diagnosis not present

## 2015-07-11 DIAGNOSIS — I10 Essential (primary) hypertension: Secondary | ICD-10-CM | POA: Diagnosis not present

## 2015-07-11 DIAGNOSIS — E1129 Type 2 diabetes mellitus with other diabetic kidney complication: Secondary | ICD-10-CM | POA: Diagnosis not present

## 2015-07-11 DIAGNOSIS — D509 Iron deficiency anemia, unspecified: Secondary | ICD-10-CM | POA: Diagnosis not present

## 2015-07-11 DIAGNOSIS — E559 Vitamin D deficiency, unspecified: Secondary | ICD-10-CM | POA: Diagnosis not present

## 2015-07-19 DIAGNOSIS — N179 Acute kidney failure, unspecified: Secondary | ICD-10-CM | POA: Diagnosis not present

## 2015-07-19 DIAGNOSIS — N184 Chronic kidney disease, stage 4 (severe): Secondary | ICD-10-CM | POA: Diagnosis not present

## 2015-07-19 DIAGNOSIS — D638 Anemia in other chronic diseases classified elsewhere: Secondary | ICD-10-CM | POA: Diagnosis not present

## 2015-07-19 DIAGNOSIS — N2581 Secondary hyperparathyroidism of renal origin: Secondary | ICD-10-CM | POA: Diagnosis not present

## 2015-07-25 DIAGNOSIS — R0902 Hypoxemia: Secondary | ICD-10-CM | POA: Diagnosis not present

## 2015-07-25 DIAGNOSIS — G4733 Obstructive sleep apnea (adult) (pediatric): Secondary | ICD-10-CM | POA: Diagnosis not present

## 2015-08-07 ENCOUNTER — Other Ambulatory Visit (HOSPITAL_COMMUNITY): Payer: Medicare Other

## 2015-08-14 ENCOUNTER — Ambulatory Visit (HOSPITAL_BASED_OUTPATIENT_CLINIC_OR_DEPARTMENT_OTHER)
Admission: RE | Admit: 2015-08-14 | Discharge: 2015-08-14 | Disposition: A | Discharge status: Home / Self Care | Payer: Medicare Other | Source: Ambulatory Visit | Attending: Nephrology | Admitting: Nephrology

## 2015-08-14 ENCOUNTER — Other Ambulatory Visit: Payer: Self-pay

## 2015-08-14 ENCOUNTER — Emergency Department (HOSPITAL_COMMUNITY): Payer: Medicare Other

## 2015-08-14 ENCOUNTER — Inpatient Hospital Stay (HOSPITAL_COMMUNITY): Payer: Medicare Other

## 2015-08-14 ENCOUNTER — Encounter (HOSPITAL_COMMUNITY): Payer: Self-pay | Admitting: Emergency Medicine

## 2015-08-14 ENCOUNTER — Inpatient Hospital Stay (HOSPITAL_COMMUNITY)
Admission: EM | Admit: 2015-08-14 | Discharge: 2015-09-08 | DRG: 871 | Disposition: A | Discharge status: Skilled Nursing Facility | Payer: Medicare Other | Attending: Internal Medicine | Admitting: Internal Medicine

## 2015-08-14 DIAGNOSIS — R109 Unspecified abdominal pain: Secondary | ICD-10-CM

## 2015-08-14 DIAGNOSIS — N365 Urethral false passage: Secondary | ICD-10-CM | POA: Diagnosis present

## 2015-08-14 DIAGNOSIS — M6281 Muscle weakness (generalized): Secondary | ICD-10-CM | POA: Diagnosis not present

## 2015-08-14 DIAGNOSIS — R5381 Other malaise: Secondary | ICD-10-CM | POA: Diagnosis not present

## 2015-08-14 DIAGNOSIS — J449 Chronic obstructive pulmonary disease, unspecified: Secondary | ICD-10-CM | POA: Diagnosis present

## 2015-08-14 DIAGNOSIS — Z794 Long term (current) use of insulin: Secondary | ICD-10-CM | POA: Diagnosis not present

## 2015-08-14 DIAGNOSIS — N390 Urinary tract infection, site not specified: Secondary | ICD-10-CM | POA: Diagnosis not present

## 2015-08-14 DIAGNOSIS — E11649 Type 2 diabetes mellitus with hypoglycemia without coma: Secondary | ICD-10-CM | POA: Diagnosis present

## 2015-08-14 DIAGNOSIS — G9341 Metabolic encephalopathy: Secondary | ICD-10-CM | POA: Diagnosis present

## 2015-08-14 DIAGNOSIS — G9340 Encephalopathy, unspecified: Secondary | ICD-10-CM | POA: Diagnosis not present

## 2015-08-14 DIAGNOSIS — R140 Abdominal distension (gaseous): Secondary | ICD-10-CM | POA: Diagnosis not present

## 2015-08-14 DIAGNOSIS — N3281 Overactive bladder: Secondary | ICD-10-CM | POA: Diagnosis not present

## 2015-08-14 DIAGNOSIS — G4730 Sleep apnea, unspecified: Secondary | ICD-10-CM | POA: Diagnosis not present

## 2015-08-14 DIAGNOSIS — R188 Other ascites: Secondary | ICD-10-CM | POA: Diagnosis not present

## 2015-08-14 DIAGNOSIS — J189 Pneumonia, unspecified organism: Secondary | ICD-10-CM | POA: Diagnosis not present

## 2015-08-14 DIAGNOSIS — J9690 Respiratory failure, unspecified, unspecified whether with hypoxia or hypercapnia: Secondary | ICD-10-CM | POA: Diagnosis not present

## 2015-08-14 DIAGNOSIS — I4892 Unspecified atrial flutter: Secondary | ICD-10-CM | POA: Diagnosis not present

## 2015-08-14 DIAGNOSIS — R339 Retention of urine, unspecified: Secondary | ICD-10-CM | POA: Diagnosis present

## 2015-08-14 DIAGNOSIS — K922 Gastrointestinal hemorrhage, unspecified: Secondary | ICD-10-CM | POA: Diagnosis present

## 2015-08-14 DIAGNOSIS — E876 Hypokalemia: Secondary | ICD-10-CM | POA: Diagnosis present

## 2015-08-14 DIAGNOSIS — R7881 Bacteremia: Secondary | ICD-10-CM | POA: Diagnosis present

## 2015-08-14 DIAGNOSIS — E1143 Type 2 diabetes mellitus with diabetic autonomic (poly)neuropathy: Secondary | ICD-10-CM | POA: Diagnosis present

## 2015-08-14 DIAGNOSIS — L89150 Pressure ulcer of sacral region, unstageable: Secondary | ICD-10-CM | POA: Diagnosis not present

## 2015-08-14 DIAGNOSIS — M1990 Unspecified osteoarthritis, unspecified site: Secondary | ICD-10-CM | POA: Diagnosis present

## 2015-08-14 DIAGNOSIS — K219 Gastro-esophageal reflux disease without esophagitis: Secondary | ICD-10-CM | POA: Diagnosis present

## 2015-08-14 DIAGNOSIS — I428 Other cardiomyopathies: Secondary | ICD-10-CM | POA: Diagnosis not present

## 2015-08-14 DIAGNOSIS — E785 Hyperlipidemia, unspecified: Secondary | ICD-10-CM

## 2015-08-14 DIAGNOSIS — E1122 Type 2 diabetes mellitus with diabetic chronic kidney disease: Secondary | ICD-10-CM | POA: Diagnosis present

## 2015-08-14 DIAGNOSIS — R0602 Shortness of breath: Secondary | ICD-10-CM | POA: Diagnosis not present

## 2015-08-14 DIAGNOSIS — K55039 Acute (reversible) ischemia of large intestine, extent unspecified: Secondary | ICD-10-CM | POA: Diagnosis not present

## 2015-08-14 DIAGNOSIS — J9601 Acute respiratory failure with hypoxia: Secondary | ICD-10-CM | POA: Diagnosis not present

## 2015-08-14 DIAGNOSIS — I248 Other forms of acute ischemic heart disease: Secondary | ICD-10-CM | POA: Diagnosis present

## 2015-08-14 DIAGNOSIS — A4159 Other Gram-negative sepsis: Secondary | ICD-10-CM | POA: Diagnosis not present

## 2015-08-14 DIAGNOSIS — IMO0002 Reserved for concepts with insufficient information to code with codable children: Secondary | ICD-10-CM | POA: Diagnosis present

## 2015-08-14 DIAGNOSIS — Z87891 Personal history of nicotine dependence: Secondary | ICD-10-CM | POA: Diagnosis not present

## 2015-08-14 DIAGNOSIS — Q211 Atrial septal defect: Secondary | ICD-10-CM

## 2015-08-14 DIAGNOSIS — I10 Essential (primary) hypertension: Secondary | ICD-10-CM | POA: Diagnosis not present

## 2015-08-14 DIAGNOSIS — I340 Nonrheumatic mitral (valve) insufficiency: Secondary | ICD-10-CM | POA: Insufficient documentation

## 2015-08-14 DIAGNOSIS — E119 Type 2 diabetes mellitus without complications: Secondary | ICD-10-CM

## 2015-08-14 DIAGNOSIS — N183 Chronic kidney disease, stage 3 unspecified: Secondary | ICD-10-CM | POA: Diagnosis present

## 2015-08-14 DIAGNOSIS — A419 Sepsis, unspecified organism: Secondary | ICD-10-CM | POA: Diagnosis present

## 2015-08-14 DIAGNOSIS — Z833 Family history of diabetes mellitus: Secondary | ICD-10-CM

## 2015-08-14 DIAGNOSIS — Z8249 Family history of ischemic heart disease and other diseases of the circulatory system: Secondary | ICD-10-CM | POA: Diagnosis not present

## 2015-08-14 DIAGNOSIS — K6389 Other specified diseases of intestine: Secondary | ICD-10-CM | POA: Diagnosis not present

## 2015-08-14 DIAGNOSIS — I5042 Chronic combined systolic (congestive) and diastolic (congestive) heart failure: Secondary | ICD-10-CM | POA: Diagnosis present

## 2015-08-14 DIAGNOSIS — K529 Noninfective gastroenteritis and colitis, unspecified: Secondary | ICD-10-CM | POA: Diagnosis present

## 2015-08-14 DIAGNOSIS — K921 Melena: Secondary | ICD-10-CM | POA: Diagnosis present

## 2015-08-14 DIAGNOSIS — M109 Gout, unspecified: Secondary | ICD-10-CM | POA: Diagnosis not present

## 2015-08-14 DIAGNOSIS — I5021 Acute systolic (congestive) heart failure: Secondary | ICD-10-CM | POA: Diagnosis not present

## 2015-08-14 DIAGNOSIS — R6521 Severe sepsis with septic shock: Secondary | ICD-10-CM | POA: Diagnosis present

## 2015-08-14 DIAGNOSIS — I071 Rheumatic tricuspid insufficiency: Secondary | ICD-10-CM

## 2015-08-14 DIAGNOSIS — R42 Dizziness and giddiness: Secondary | ICD-10-CM

## 2015-08-14 DIAGNOSIS — D638 Anemia in other chronic diseases classified elsewhere: Secondary | ICD-10-CM | POA: Diagnosis not present

## 2015-08-14 DIAGNOSIS — Z825 Family history of asthma and other chronic lower respiratory diseases: Secondary | ICD-10-CM

## 2015-08-14 DIAGNOSIS — E1169 Type 2 diabetes mellitus with other specified complication: Secondary | ICD-10-CM | POA: Diagnosis not present

## 2015-08-14 DIAGNOSIS — I119 Hypertensive heart disease without heart failure: Secondary | ICD-10-CM | POA: Insufficient documentation

## 2015-08-14 DIAGNOSIS — K559 Vascular disorder of intestine, unspecified: Secondary | ICD-10-CM | POA: Diagnosis not present

## 2015-08-14 DIAGNOSIS — R7989 Other specified abnormal findings of blood chemistry: Secondary | ICD-10-CM | POA: Diagnosis not present

## 2015-08-14 DIAGNOSIS — D62 Acute posthemorrhagic anemia: Secondary | ICD-10-CM | POA: Diagnosis present

## 2015-08-14 DIAGNOSIS — I4891 Unspecified atrial fibrillation: Secondary | ICD-10-CM | POA: Insufficient documentation

## 2015-08-14 DIAGNOSIS — Z6839 Body mass index (BMI) 39.0-39.9, adult: Secondary | ICD-10-CM

## 2015-08-14 DIAGNOSIS — N189 Chronic kidney disease, unspecified: Secondary | ICD-10-CM | POA: Diagnosis not present

## 2015-08-14 DIAGNOSIS — I129 Hypertensive chronic kidney disease with stage 1 through stage 4 chronic kidney disease, or unspecified chronic kidney disease: Secondary | ICD-10-CM | POA: Diagnosis not present

## 2015-08-14 DIAGNOSIS — I358 Other nonrheumatic aortic valve disorders: Secondary | ICD-10-CM | POA: Insufficient documentation

## 2015-08-14 DIAGNOSIS — K3184 Gastroparesis: Secondary | ICD-10-CM | POA: Diagnosis not present

## 2015-08-14 DIAGNOSIS — R609 Edema, unspecified: Secondary | ICD-10-CM

## 2015-08-14 DIAGNOSIS — I130 Hypertensive heart and chronic kidney disease with heart failure and stage 1 through stage 4 chronic kidney disease, or unspecified chronic kidney disease: Secondary | ICD-10-CM | POA: Diagnosis not present

## 2015-08-14 DIAGNOSIS — G4700 Insomnia, unspecified: Secondary | ICD-10-CM | POA: Diagnosis present

## 2015-08-14 DIAGNOSIS — Z9289 Personal history of other medical treatment: Secondary | ICD-10-CM

## 2015-08-14 DIAGNOSIS — Z4659 Encounter for fitting and adjustment of other gastrointestinal appliance and device: Secondary | ICD-10-CM

## 2015-08-14 DIAGNOSIS — R944 Abnormal results of kidney function studies: Secondary | ICD-10-CM | POA: Diagnosis not present

## 2015-08-14 DIAGNOSIS — J9600 Acute respiratory failure, unspecified whether with hypoxia or hypercapnia: Secondary | ICD-10-CM | POA: Diagnosis present

## 2015-08-14 DIAGNOSIS — E441 Mild protein-calorie malnutrition: Secondary | ICD-10-CM | POA: Diagnosis not present

## 2015-08-14 DIAGNOSIS — I429 Cardiomyopathy, unspecified: Secondary | ICD-10-CM | POA: Diagnosis not present

## 2015-08-14 DIAGNOSIS — I5023 Acute on chronic systolic (congestive) heart failure: Secondary | ICD-10-CM | POA: Diagnosis not present

## 2015-08-14 DIAGNOSIS — I5020 Unspecified systolic (congestive) heart failure: Secondary | ICD-10-CM | POA: Diagnosis present

## 2015-08-14 DIAGNOSIS — I483 Typical atrial flutter: Secondary | ICD-10-CM | POA: Diagnosis not present

## 2015-08-14 DIAGNOSIS — G4733 Obstructive sleep apnea (adult) (pediatric): Secondary | ICD-10-CM | POA: Diagnosis not present

## 2015-08-14 DIAGNOSIS — I471 Supraventricular tachycardia, unspecified: Secondary | ICD-10-CM | POA: Diagnosis not present

## 2015-08-14 DIAGNOSIS — R570 Cardiogenic shock: Secondary | ICD-10-CM | POA: Diagnosis present

## 2015-08-14 DIAGNOSIS — J690 Pneumonitis due to inhalation of food and vomit: Secondary | ICD-10-CM | POA: Diagnosis not present

## 2015-08-14 DIAGNOSIS — B9689 Other specified bacterial agents as the cause of diseases classified elsewhere: Secondary | ICD-10-CM | POA: Diagnosis not present

## 2015-08-14 DIAGNOSIS — Z4682 Encounter for fitting and adjustment of non-vascular catheter: Secondary | ICD-10-CM | POA: Diagnosis not present

## 2015-08-14 DIAGNOSIS — K5730 Diverticulosis of large intestine without perforation or abscess without bleeding: Secondary | ICD-10-CM | POA: Diagnosis present

## 2015-08-14 DIAGNOSIS — N184 Chronic kidney disease, stage 4 (severe): Secondary | ICD-10-CM | POA: Diagnosis not present

## 2015-08-14 DIAGNOSIS — R933 Abnormal findings on diagnostic imaging of other parts of digestive tract: Secondary | ICD-10-CM | POA: Diagnosis present

## 2015-08-14 DIAGNOSIS — I499 Cardiac arrhythmia, unspecified: Secondary | ICD-10-CM | POA: Diagnosis not present

## 2015-08-14 DIAGNOSIS — R1084 Generalized abdominal pain: Secondary | ICD-10-CM | POA: Diagnosis not present

## 2015-08-14 DIAGNOSIS — N1832 Chronic kidney disease, stage 3b: Secondary | ICD-10-CM | POA: Diagnosis present

## 2015-08-14 DIAGNOSIS — R600 Localized edema: Secondary | ICD-10-CM | POA: Diagnosis not present

## 2015-08-14 DIAGNOSIS — E118 Type 2 diabetes mellitus with unspecified complications: Secondary | ICD-10-CM

## 2015-08-14 DIAGNOSIS — N179 Acute kidney failure, unspecified: Secondary | ICD-10-CM | POA: Diagnosis not present

## 2015-08-14 DIAGNOSIS — Y95 Nosocomial condition: Secondary | ICD-10-CM | POA: Diagnosis not present

## 2015-08-14 DIAGNOSIS — R0902 Hypoxemia: Secondary | ICD-10-CM | POA: Diagnosis not present

## 2015-08-14 DIAGNOSIS — R2681 Unsteadiness on feet: Secondary | ICD-10-CM | POA: Diagnosis not present

## 2015-08-14 DIAGNOSIS — D696 Thrombocytopenia, unspecified: Secondary | ICD-10-CM | POA: Diagnosis not present

## 2015-08-14 DIAGNOSIS — E1165 Type 2 diabetes mellitus with hyperglycemia: Secondary | ICD-10-CM | POA: Diagnosis present

## 2015-08-14 DIAGNOSIS — T85598A Other mechanical complication of other gastrointestinal prosthetic devices, implants and grafts, initial encounter: Secondary | ICD-10-CM

## 2015-08-14 DIAGNOSIS — R918 Other nonspecific abnormal finding of lung field: Secondary | ICD-10-CM | POA: Diagnosis not present

## 2015-08-14 DIAGNOSIS — L8990 Pressure ulcer of unspecified site, unspecified stage: Secondary | ICD-10-CM | POA: Insufficient documentation

## 2015-08-14 DIAGNOSIS — D649 Anemia, unspecified: Secondary | ICD-10-CM | POA: Diagnosis not present

## 2015-08-14 HISTORY — DX: Essential (primary) hypertension: I10

## 2015-08-14 HISTORY — DX: Type 2 diabetes mellitus without complications: E11.9

## 2015-08-14 HISTORY — DX: Chronic kidney disease, stage 3 unspecified: N18.30

## 2015-08-14 HISTORY — DX: Chronic kidney disease, stage 3 (moderate): N18.3

## 2015-08-14 LAB — BASIC METABOLIC PANEL
Anion gap: 9 (ref 5–15)
Anion gap: 16 — ABNORMAL HIGH (ref 5–15)
BUN: 35 mg/dL — ABNORMAL HIGH (ref 6–20)
BUN: 37 mg/dL — ABNORMAL HIGH (ref 6–20)
CO2: 29 mmol/L (ref 22–32)
CO2: 21 mmol/L — ABNORMAL LOW (ref 22–32)
Calcium: 10.1 mg/dL (ref 8.9–10.3)
Calcium: 9.6 mg/dL (ref 8.9–10.3)
Chloride: 100 mmol/L — ABNORMAL LOW (ref 101–111)
Chloride: 99 mmol/L — ABNORMAL LOW (ref 101–111)
Creatinine, Ser: 2.33 mg/dL — ABNORMAL HIGH (ref 0.61–1.24)
Creatinine, Ser: 2.41 mg/dL — ABNORMAL HIGH (ref 0.61–1.24)
GFR calc Af Amer: 33 mL/min — ABNORMAL LOW (ref >60)
GFR calc Af Amer: 31 mL/min — ABNORMAL LOW (ref >60)
GFR calc non Af Amer: 28 mL/min — ABNORMAL LOW (ref >60)
GFR calc non Af Amer: 27 mL/min — ABNORMAL LOW (ref >60)
Glucose, Bld: 52 mg/dL — ABNORMAL LOW (ref 65–99)
Glucose, Bld: 263 mg/dL — ABNORMAL HIGH (ref 65–99)
Potassium: 2.9 mmol/L — ABNORMAL LOW (ref 3.5–5.1)
Potassium: 4 mmol/L (ref 3.5–5.1)
Sodium: 138 mmol/L (ref 135–145)
Sodium: 136 mmol/L (ref 135–145)

## 2015-08-14 LAB — ECHOCARDIOGRAM COMPLETE
E decel time: 225 msec
FS: 20 % — AB (ref 28–44)
IVS/LV PW RATIO, ED: 0.96
LA ID, A-P, ES: 50 mm
LA diam end sys: 50 mm
LA diam index: 2.23 cm/m2
LA vol A4C: 72.8 ml
LA vol index: 31.6 mL/m2
LA vol: 70.7 mL
LV PW d: 12.7 mm — AB (ref 0.6–1.1)
LV dias vol index: 28 mL/m2
LV dias vol: 64 mL (ref 62–150)
LV sys vol index: 17 mL/m2
LV sys vol: 38 mL (ref 21–61)
LVOT area: 2.84 cm2
LVOT diameter: 19 mm
MV Dec: 225
MV Peak grad: 2 mmHg
MV pk E vel: 78.8 m/s
Reg peak vel: 240 cm/s
Simpson's disk: 41
Stroke v: 26 ml
TAPSE: 10.7 mm
TR max vel: 240 cm/s

## 2015-08-14 LAB — GLUCOSE, CAPILLARY
Glucose-Capillary: 173 mg/dL — ABNORMAL HIGH (ref 65–99)
Glucose-Capillary: 338 mg/dL — ABNORMAL HIGH (ref 65–99)
Glucose-Capillary: 276 mg/dL — ABNORMAL HIGH (ref 65–99)

## 2015-08-14 LAB — URINALYSIS, ROUTINE W REFLEX MICROSCOPIC
Bilirubin Urine: NEGATIVE
Glucose, UA: 100 mg/dL — AB
Ketones, ur: NEGATIVE mg/dL
Nitrite: NEGATIVE
Protein, ur: 100 mg/dL — AB
Specific Gravity, Urine: 1.015 (ref 1.005–1.030)
pH: 5 (ref 5.0–8.0)

## 2015-08-14 LAB — CBC
HCT: 40.2 % (ref 39.0–52.0)
Hemoglobin: 13.8 g/dL (ref 13.0–17.0)
MCH: 29.6 pg (ref 26.0–34.0)
MCHC: 34.3 g/dL (ref 30.0–36.0)
MCV: 86.3 fL (ref 78.0–100.0)
Platelets: 367 10*3/uL (ref 150–400)
RBC: 4.66 MIL/uL (ref 4.22–5.81)
RDW: 14.3 % (ref 11.5–15.5)
WBC: 9.2 10*3/uL (ref 4.0–10.5)

## 2015-08-14 LAB — TSH: TSH: 1.917 u[IU]/mL (ref 0.350–4.500)

## 2015-08-14 LAB — I-STAT CHEM 8, ED
BUN: 34 mg/dL — ABNORMAL HIGH (ref 6–20)
Calcium, Ion: 1.26 mmol/L (ref 1.13–1.30)
Chloride: 97 mmol/L — ABNORMAL LOW (ref 101–111)
Creatinine, Ser: 2.4 mg/dL — ABNORMAL HIGH (ref 0.61–1.24)
Glucose, Bld: 53 mg/dL — ABNORMAL LOW (ref 65–99)
HCT: 42 % (ref 39.0–52.0)
Hemoglobin: 14.3 g/dL (ref 13.0–17.0)
Potassium: 3.1 mmol/L — ABNORMAL LOW (ref 3.5–5.1)
Sodium: 140 mmol/L (ref 135–145)
TCO2: 27 mmol/L (ref 0–100)

## 2015-08-14 LAB — PROTIME-INR
INR: 1.12 (ref 0.00–1.49)
Prothrombin Time: 14.6 seconds (ref 11.6–15.2)

## 2015-08-14 LAB — I-STAT TROPONIN, ED: Troponin i, poc: 0.13 ng/mL (ref 0.00–0.08)

## 2015-08-14 LAB — MRSA PCR SCREENING: MRSA by PCR: NEGATIVE

## 2015-08-14 LAB — MAGNESIUM
Magnesium: 2.1 mg/dL (ref 1.7–2.4)
Magnesium: 1.8 mg/dL (ref 1.7–2.4)

## 2015-08-14 LAB — URINE MICROSCOPIC-ADD ON

## 2015-08-14 LAB — TROPONIN I
Troponin I: 0.13 ng/mL — ABNORMAL HIGH (ref <0.031)
Troponin I: 0.15 ng/mL — ABNORMAL HIGH (ref <0.031)
Troponin I: 0.14 ng/mL — ABNORMAL HIGH (ref <0.031)

## 2015-08-14 LAB — HEPARIN LEVEL (UNFRACTIONATED): Heparin Unfractionated: 0.36 IU/mL (ref 0.30–0.70)

## 2015-08-14 LAB — BRAIN NATRIURETIC PEPTIDE: B Natriuretic Peptide: 609 pg/mL — ABNORMAL HIGH (ref 0.0–100.0)

## 2015-08-14 LAB — CBG MONITORING, ED: Glucose-Capillary: 138 mg/dL — ABNORMAL HIGH (ref 65–99)

## 2015-08-14 MED ORDER — POTASSIUM CHLORIDE CRYS ER 20 MEQ PO TBCR
20.0000 meq | EXTENDED_RELEASE_TABLET | Freq: Two times a day (BID) | ORAL | Status: DC
  Administered 2015-08-14: 20 meq via ORAL

## 2015-08-14 MED ORDER — SODIUM CHLORIDE 0.9% FLUSH
3.0000 mL | Freq: Two times a day (BID) | INTRAVENOUS | Status: DC
  Administered 2015-08-15 – 2015-08-16 (×2): 3 mL via INTRAVENOUS

## 2015-08-14 MED ORDER — SODIUM CHLORIDE 0.9 % IV SOLN: 250.0000 mL | INTRAVENOUS | Status: DC

## 2015-08-14 MED ORDER — SODIUM CHLORIDE 0.9 % IV SOLN
INTRAVENOUS | Status: DC
  Administered 2015-08-14: 15:00:00 via INTRAVENOUS

## 2015-08-14 MED ORDER — DILTIAZEM LOAD VIA INFUSION
20.0000 mg | Freq: Once | INTRAVENOUS | Status: AC
  Administered 2015-08-14: 20 mg via INTRAVENOUS

## 2015-08-14 MED ORDER — HYDROCORTISONE 1 % EX CREA
1.0000 "application " | TOPICAL_CREAM | Freq: Three times a day (TID) | CUTANEOUS | Status: DC | PRN
  Filled 2015-08-14: qty 28

## 2015-08-14 MED ORDER — PANTOPRAZOLE SODIUM 40 MG PO TBEC
40.0000 mg | DELAYED_RELEASE_TABLET | Freq: Every day | ORAL | Status: DC
  Administered 2015-08-14: 40 mg via ORAL
  Filled 2015-08-14: qty 1

## 2015-08-14 MED ORDER — METOPROLOL TARTRATE 50 MG PO TABS
100.0000 mg | ORAL_TABLET | Freq: Two times a day (BID) | ORAL | Status: DC
  Administered 2015-08-14: 100 mg via ORAL
  Filled 2015-08-14: qty 2

## 2015-08-14 MED ORDER — PIPERACILLIN-TAZOBACTAM 3.375 G IVPB
3.3750 g | INTRAVENOUS | Status: AC
  Administered 2015-08-14: 3.375 g via INTRAVENOUS

## 2015-08-14 MED ORDER — ACETAMINOPHEN 325 MG PO TABS: 650.0000 mg | ORAL_TABLET | ORAL | Status: DC | PRN

## 2015-08-14 MED ORDER — DILTIAZEM HCL 100 MG IV SOLR
INTRAVENOUS | Status: AC
  Administered 2015-08-14: 15:00:00
  Filled 2015-08-14: qty 100

## 2015-08-14 MED ORDER — ASPIRIN 81 MG PO CHEW
324.0000 mg | CHEWABLE_TABLET | Freq: Once | ORAL | Status: AC
  Administered 2015-08-14: 324 mg via ORAL
  Filled 2015-08-14: qty 4

## 2015-08-14 MED ORDER — SODIUM CHLORIDE 0.9% FLUSH: 3.0000 mL | INTRAVENOUS | Status: DC | PRN

## 2015-08-14 MED ORDER — IPRATROPIUM-ALBUTEROL 0.5-2.5 (3) MG/3ML IN SOLN
3.0000 mL | RESPIRATORY_TRACT | Status: DC | PRN
  Administered 2015-08-15: 3 mL via RESPIRATORY_TRACT
  Filled 2015-08-14: qty 3

## 2015-08-14 MED ORDER — ALLOPURINOL 300 MG PO TABS: 300.0000 mg | ORAL_TABLET | Freq: Every day | ORAL | Status: DC

## 2015-08-14 MED ORDER — VANCOMYCIN HCL IN DEXTROSE 1-5 GM/200ML-% IV SOLN
1000.0000 mg | INTRAVENOUS | Status: AC
  Administered 2015-08-14: 1000 mg via INTRAVENOUS

## 2015-08-14 MED ORDER — SODIUM CHLORIDE 0.9% FLUSH: 3.0000 mL | Freq: Two times a day (BID) | INTRAVENOUS | Status: DC

## 2015-08-14 MED ORDER — FUROSEMIDE 10 MG/ML IJ SOLN
40.0000 mg | Freq: Two times a day (BID) | INTRAMUSCULAR | Status: DC
  Administered 2015-08-14: 40 mg via INTRAVENOUS
  Filled 2015-08-14: qty 4

## 2015-08-14 MED ORDER — HEPARIN BOLUS VIA INFUSION
4000.0000 [IU] | Freq: Once | INTRAVENOUS | Status: AC
  Administered 2015-08-14: 4000 [IU] via INTRAVENOUS

## 2015-08-14 MED ORDER — POTASSIUM CHLORIDE CRYS ER 20 MEQ PO TBCR
60.0000 meq | EXTENDED_RELEASE_TABLET | ORAL | Status: AC
  Administered 2015-08-14: 60 meq via ORAL
  Filled 2015-08-14: qty 3

## 2015-08-14 MED ORDER — SODIUM CHLORIDE 0.9 % IV SOLN: 250.0000 mL | INTRAVENOUS | Status: DC | PRN

## 2015-08-14 MED ORDER — INSULIN ASPART 100 UNIT/ML ~~LOC~~ SOLN
0.0000 [IU] | Freq: Three times a day (TID) | SUBCUTANEOUS | Status: DC
  Administered 2015-08-14: 2 [IU] via SUBCUTANEOUS
  Administered 2015-08-15 (×2): 1 [IU] via SUBCUTANEOUS

## 2015-08-14 MED ORDER — DILTIAZEM HCL 100 MG IV SOLR
5.0000 mg/h | INTRAVENOUS | Status: DC
  Administered 2015-08-14 (×2): 5 mg/h via INTRAVENOUS
  Administered 2015-08-15: 12 mg/h via INTRAVENOUS
  Filled 2015-08-14 (×2): qty 100

## 2015-08-14 MED ORDER — HEPARIN (PORCINE) IN NACL 100-0.45 UNIT/ML-% IJ SOLN
1300.0000 [IU]/h | INTRAMUSCULAR | Status: DC
  Administered 2015-08-14 – 2015-08-15 (×2): 1300 [IU]/h via INTRAVENOUS
  Filled 2015-08-14 (×2): qty 250

## 2015-08-14 MED ORDER — POTASSIUM CHLORIDE CRYS ER 20 MEQ PO TBCR
40.0000 meq | EXTENDED_RELEASE_TABLET | ORAL | Status: AC
  Administered 2015-08-14: 40 meq via ORAL
  Filled 2015-08-14: qty 2

## 2015-08-14 MED ORDER — SIMVASTATIN 20 MG PO TABS
10.0000 mg | ORAL_TABLET | Freq: Every day | ORAL | Status: DC
  Administered 2015-08-14 – 2015-08-15 (×2): 10 mg via ORAL
  Filled 2015-08-14 (×4): qty 1

## 2015-08-14 MED ORDER — LINACLOTIDE 290 MCG PO CAPS
290.0000 ug | ORAL_CAPSULE | Freq: Every day | ORAL | Status: DC
  Filled 2015-08-14 (×2): qty 2

## 2015-08-14 MED ORDER — HYDROCODONE-ACETAMINOPHEN 5-325 MG PO TABS
1.0000 | ORAL_TABLET | Freq: Four times a day (QID) | ORAL | Status: DC | PRN
  Administered 2015-08-14: 1 via ORAL
  Filled 2015-08-14: qty 1

## 2015-08-14 MED ORDER — ONDANSETRON HCL 4 MG/2ML IJ SOLN
4.0000 mg | Freq: Four times a day (QID) | INTRAMUSCULAR | Status: DC | PRN
  Administered 2015-08-23 – 2015-09-06 (×13): 4 mg via INTRAVENOUS
  Filled 2015-08-14 (×14): qty 2

## 2015-08-14 MED ORDER — VITAMIN D 1000 UNITS PO TABS
1000.0000 [IU] | ORAL_TABLET | Freq: Every day | ORAL | Status: DC
  Administered 2015-08-14: 1000 [IU] via ORAL
  Filled 2015-08-14: qty 1

## 2015-08-14 MED ORDER — ZOLPIDEM TARTRATE 5 MG PO TABS
10.0000 mg | ORAL_TABLET | Freq: Every day | ORAL | Status: DC
  Administered 2015-08-14: 10 mg via ORAL
  Filled 2015-08-14: qty 2

## 2015-08-14 MED ORDER — LINACLOTIDE 290 MCG PO CAPS
290.0000 ug | ORAL_CAPSULE | Freq: Every day | ORAL | Status: DC
  Filled 2015-08-14 (×2): qty 1

## 2015-08-14 NOTE — Progress Notes (Signed)
Patient vomited in CPAP mask. New mask brought down, but discussed with RN about not putting it back on at this time. Patient placed on 4LNC. RT will continue to monitor.

## 2015-08-14 NOTE — ED Notes (Signed)
Pt brought into triage room, EKG performed. Dr. Reather Converse reviewed EKG and reported to bring patient back to room next.

## 2015-08-14 NOTE — Progress Notes (Signed)
ANTICOAGULATION CONSULT NOTE - Initial Consult  Pharmacy Consult for heparin Indication: atrial fibrillation  No Known Allergies  Patient Measurements: Height: 5\' 6"  (167.6 cm) Weight: 271 lb (122.925 kg) IBW/kg (Calculated) : 63.8 HEPARIN DW (KG): 92.7  Vital Signs: Temp: 98.1 F (36.7 C) (06/12 1430) Temp Source: Oral (06/12 1430) BP: 113/89 mmHg (06/12 1523) Pulse Rate: 121 (06/12 1523)  Labs:  Recent Labs  08/14/15 1444 08/14/15 1501  HGB 14.3 13.8  HCT 42.0 40.2  PLT  --  367  CREATININE 2.40* 2.33*    Estimated Creatinine Clearance: 40.1 mL/min (by C-G formula based on Cr of 2.33).   Medical History: Past Medical History  Diagnosis Date  . Gout   . COPD (chronic obstructive pulmonary disease) ([place])   . Type 2 diabetes mellitus ([place])   . GERD (gastroesophageal reflux disease)   . Essential hypertension   . Hyperlipemia   . OA (osteoarthritis)   . Gastroparesis   . Adenomatous colon polyp 10/30/09    Serrated adenoma removed during Colonoscopy   . Diverticulosis of colon   . CKD (chronic kidney disease) stage 3, GFR 30-59 ml/min     Medications:  See med rec   Assessment: Newly diagnosed atrial flutter with RVR. Patient has experienced  leg edema and intermittent shortness of breath over the last few weeks to months. CHADSVASC score is 3. Plan to start IV heparin and anticipate a TEE duided cardioversion tomorrow.   Goal of Therapy:  Heparin level 0.3-0.7 units/ml Monitor platelets by anticoagulation protocol: Yes   Plan:  Give 4000 units bolus x 1 Start heparin infusion at 1300 units/hr Check anti-Xa level in 6 hours and daily while on heparin Continue to monitor H&H and platelets  Isac Sarna, BS Vena [place], BCPS Clinical Pharmacist Pager [phone]  08/14/2015,3:49 PM

## 2015-08-14 NOTE — Progress Notes (Signed)
Patient shaking and c/o feeling cold. HR increased into 140s and SBP >200 despite being maxed on cardizem drip and receiving PO lopressor dose. No c/o of pain or sob. Dr Harvest Forest up to room to assess patient. Labs and medications ordered. HR is now better at 95 and SBP 150s.

## 2015-08-14 NOTE — Progress Notes (Signed)
Unable to insert foley catheter.  Resistance met and unable to advance catheter into the bladder. 

## 2015-08-14 NOTE — Consult Note (Signed)
Requesting provider: Dr. Tamala Julian, [facility] Consulting cardiologist: Frank Hoover  Reason for consultation: Atrial flutter, newly diagnosed cardiomyopathy, abnormal troponin I  Clinical Summary Frank Hoover is a 65 y.o.male with past medical history outlined below, referred today by his nephrologist for an echocardiogram secondary to elevated heart rate and leg edema. He then was referred from the cardiovascular lab to the ER after call placed to Dr. Lowanda Foster reporting patient heart rate in the 120s and associated dizziness. He has been found to be in atrial flutter with RVR of uncertain duration, and has evidence of a cardiomyopathy with LVEF approximately 25%.  She states that he has had trouble with leg edema over the last few months. He has not been specifically aware of any palpitations or chest pain. Not very active at home, reports NYHA class 2-3 dyspnea. He has no prior diagnosis of cardiomyopathy or cardiac arrhythmia.  ECG shows typical atrial flutter with 2:1 block, nonspecific ST segment changes associated with the flutter waves, not STEMI as read by the computer interpretation. Troponin I is elevated at 0.13 in the absence of chest pain. Creatinine is 2.4 with history of CKD stage III.  No Known Allergies  Home Medications No current facility-administered medications on file prior to encounter.   Current Outpatient Prescriptions on File Prior to Encounter  Medication Sig Dispense Refill  . acetaminophen (TYLENOL) 500 MG tablet Take 1 tablet (500 mg total) by mouth every 6 (six) hours as needed for moderate pain. 30 tablet 0  . albuterol (PROAIR HFA) 108 (90 BASE) MCG/ACT inhaler Inhale 2 puffs into the lungs every 6 (six) hours as needed for wheezing or shortness of breath.     . allopurinol (ZYLOPRIM) 300 MG tablet Take 300 mg by mouth daily.      Marland Kitchen amLODipine (NORVASC) 10 MG tablet Take 10 mg by mouth daily.    . Cholecalciferol (VITAMIN D PO) Take 1,000 Units by  mouth daily.    . cloNIDine (CATAPRES) 0.2 MG tablet Take 0.2 mg by mouth 2 (two) times daily.     . furosemide (LASIX) 40 MG tablet Take 1 tablet (40 mg total) by mouth daily. 30 tablet 12  . gabapentin (NEURONTIN) 400 MG capsule Take 400 mg by mouth 2 (two) times daily. Takes at noon & bedtime    . glimepiride (AMARYL) 2 MG tablet Take 2 mg by mouth daily before breakfast.      . HUMALOG KWIKPEN 100 UNIT/ML KiwkPen Inject 8-15 Units into the skin 3 (three) times daily before meals. Per sliding scale    . hydrALAZINE (APRESOLINE) 50 MG tablet Take 1 tablet (50 mg total) by mouth 2 (two) times daily. 60 tablet 12  . HYDROcodone-acetaminophen (NORCO/VICODIN) 5-325 MG per tablet Take 1 tablet by mouth every 6 (six) hours as needed for moderate pain.     Marland Kitchen lanolin ointment Apply 1 application topically 2 (two) times daily as needed for dry skin.    Marland Kitchen LANTUS SOLOSTAR 100 UNIT/ML Solostar Pen Inject 15 Units into the skin every morning.     Marland Kitchen LINZESS 290 MCG CAPS capsule TAKE 1 CAPSULE ONCE DAILY 30 MINUTES BEFORE MEALS 90 capsule 3  . metoprolol (LOPRESSOR) 100 MG tablet Take 100 mg by mouth 2 (two) times daily.     Marland Kitchen omeprazole (PRILOSEC) 20 MG capsule Take 20 mg by mouth daily.    . simvastatin (ZOCOR) 10 MG tablet Take 10 mg by mouth daily.    Nelva Nay SOLOSTAR 300 UNIT/ML  SOPN Inject 15 Units into the skin at bedtime.    Marland Kitchen zolpidem (AMBIEN) 10 MG tablet Take 10 mg by mouth at bedtime.       Past Medical History  Diagnosis Date  . Gout   . COPD (chronic obstructive pulmonary disease) ([place])   . Type 2 diabetes mellitus ([place])   . GERD (gastroesophageal reflux disease)   . Essential hypertension   . Hyperlipemia   . OA (osteoarthritis)   . Gastroparesis   . Adenomatous colon polyp 10/30/09    Serrated adenoma removed during Colonoscopy   . Diverticulosis of colon   . CKD (chronic kidney disease) stage 3, GFR 30-59 ml/min     Past Surgical History  Procedure Laterality Date  .  Tonsillectomy    . Cataract extraction Right   . Esophagogastroduodenoscopy  10/30/09    TDD:UKGURK   . Colonoscopy  10/30/09    YHC:WCBJ-SEGBT diverticulum/serrated adenoma from ICV, next colonoscopy due 10/2012  . Colonoscopy N/A 09/18/2012    DVV:OHYWVPX mucosa that was seen appeared normal, however most of it was not seen due to be very poor prep  . Colonoscopy N/A 04/08/2013    TGG:YIRSWNI polyp removed/inadequate preparation  . Colonoscopy N/A 06/22/2014    Procedure: COLONOSCOPY;  Surgeon: Daneil Dolin, MD;  Location: [facility];  Service: Endoscopy;  Laterality: N/A;  115     Family History  Problem Relation Age of Onset  . Diabetes Mother   . COPD Father   . Hypertension Mother   . Colon cancer Neg Hx     Social History Mr. Morini reports that he quit smoking about 4 years ago. His smoking use included Cigarettes. He has a 4 pack-year smoking history. He does not have any smokeless tobacco history on file. Mr. Nagy reports that he does not drink alcohol.  Review of Systems Complete review of systems negative except as otherwise outlined in the clinical summary and also the following. Sedentary. Arthritic stiffness.  Physical Examination Blood pressure 113/89, pulse 121, temperature 98.1 F (36.7 C), temperature source Oral, resp. rate 16, height 5' 6"  (1.676 m), weight 271 lb (122.925 kg), SpO2 94 %. No intake or output data in the 24 hours ending 08/14/15 1534  Telemetry: Atrial flutter with 2:1 block.  Gen.: Obese male in no acute distress. HEENT: Conjunctiva and lids normal, oropharynx clear. Neck: Supple, elevated JVP, no carotid bruits, no thyromegaly. Lungs: Decreased breath sounds with scattered rhonchi, nonlabored breathing at rest. Cardiac: Rapid regular rate and rhythm, + S3, soft systolic murmur, no pericardial rub. Abdomen: Soft, protuberant, bowel sounds present, no guarding or rebound. Extremities: 2-3+ leg edema, distal pulses 2+. Skin: Warm and  dry. Musculoskeletal: No kyphosis. Neuropsychiatric: Alert and oriented x3, affect grossly appropriate.  Lab Results  Basic Metabolic Panel:  Recent Labs Lab 08/14/15 1444  NA 140  K 3.1*  CL 97*  GLUCOSE 53*  BUN 34*  CREATININE 2.40*    CBC:  Recent Labs Lab 08/14/15 1444 08/14/15 1501  WBC  --  9.2  HGB 14.3 13.8  HCT 42.0 40.2  MCV  --  86.3  PLT  --  367    Cardiac Enzymes: Troponin I 0.13  Imaging  Echocardiogram 08/14/2015: Study Conclusions  - Left ventricle: The cavity size was normal. Wall thickness was  increased in a pattern of mild LVH. The estimated ejection  fraction was 25%. Study telemetry shows a regular tachycardia  suggestive of atrial flutter. Diffuse hypokinesis. Probable  akinesis of  the inferoseptal myocardium. The study is not  technically sufficient to allow evaluation of LV diastolic  function. - Aortic valve: Mildly calcified annulus. Trileaflet. - Mitral valve: There was trivial regurgitation. - Left atrium: The atrium was mildly dilated. - Right ventricle: Systolic function was moderately reduced. - Right atrium: The atrium was at the upper limits of normal in  size. Central venous pressure (est): 8 mm Hg. - Atrial septum: There was a patent foramen ovale. Bidirectional  shunting noted by color Doppler. - Tricuspid valve: There was mild regurgitation. - Pulmonary arteries: PA peak pressure: 31 mm Hg (S). - Pericardium, extracardiac: There was no pericardial effusion.  Impressions:  - Mild LVH with LVEF approximately 25%, diffuse hypokinesis with  probable akinesis of the inferior septum. Left ventricular  dysfunction is new in comparison to the previous study from 2015.  Study telemetry looks to show atrial flutter with RVR.  Indeterminate diastolic function. Mild left atrial enlargement.  Trivial mitral regurgitation. Moderately reduced RV contraction.  Mild tricuspid regurgitation with PASP 31 mmHg.  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  Impression  1. Newly diagnosed typical atrial flutter with RVR, duration uncertain but potentially over the last few weeks to months with associated leg edema and intermittent shortness of breath. CHADSVASC score is 3.  2. Newly diagnosed cardiomyopathy with LVEF 25%, new in comparison to prior study from 2015. Also evidence of RV dysfunction. Overall diffuse hypokinesis with possible akinesis of the inferior septum. Tachycardia-mediated cardiomyopathy is certainly possible. With mildly elevated troponin I, possible underlying ischemic heart disease is also to be considered although he is not having any chest pain.  3. CKD stage 3, creatinine 2.4, he is followed by Dr. Lowanda Foster.  4. Essential hypertension.  5. Type 2 diabetes mellitus.  6. Obesity, consider OSA.  Recommendations  Discussed with patient and wife, also ER provider. Patient being admitted to the hospitalist service. Would admit him to the ICU, continue high-dose Lopressor which he has been on at home, supplemental intravenous Cardizem for heart rate control as tolerated. Hold off on Norvasc since this medication may need to be stopped, diurese with IV Lasix. Would start him on intravenous heparin and we will anticipate a TEE guided cardioversion for tomorrow presuming he does not convert with heart rate control. Can then consider whether we need to start him on amiodarone for rhythm management in light of cardiomyopathy, and then further adjust his medications. Will probably transition to renal dose Xarelto depending on clinical progress and whether he needs any additional invasive testing in the short term. Cycle cardiac markers, check TSH and LFTs, replete potassium. Will consider mode of ischemic testing once more stable and based on troponin I trend, although with a creatinine of 2.4, would tend to avoid angiography due to risk of contrast nephropathy.  Satira Sark, M.D., F.A.C.C.

## 2015-08-14 NOTE — ED Notes (Signed)
EDP at pt bedside, assessing patient and repeat EKG. EDP reported to consult cardiology. Primary RN and Unit secretary aware.

## 2015-08-14 NOTE — H&P (Signed)
History and Physical    Frank Hoover [id number] DOB: February 01, 1951 DOA: 08/14/2015  Referring MD/NP/PA: Dr. Reather Converse PCP: Alonza Bogus, MD  Patient coming from: Echocardiogram lab  Chief Complaint: Dizziness  HPI: Frank Hoover is a 65 y.o. male with medical history significant of COPD, HTN, HLD, DM2, CKD stage III, and remote tobacco abuse history; who presents from echocardiogram lab for further evaluation with complaints of dizziness after being found to have a low ejection fraction. Referred for the echocardiogram by his nephrologist Dr. Lowanda Foster; due to elevated heart rates and lower leg swelling. Patient notes that he's had some increasing leg swelling over the last few months and  he's been short of breath whenever he has exerted himself. Denies feeling any chest pain, palpitations, or history of arrhythmia/heart failure to his knowledge. Review of records shows that patient's ejection fraction was approximately 25% on echocardiogram done today. He has not eaten much today but he did note that he took his regularly scheduled medications including insulin.  ED Course: Upon admission patient was seen to be afebrile with heart rates up to 123, blood pressure as low as 101/82, and all other vital signs within normal limits. Lab work revealed normal CBC, potassium 2.9, BUN 35, creatinine 2.33, glucose  52, and a troponin 0.13. Chest x-ray showed no active abnormalities. Dr. Rozann Lesches of Cardiology was consulted for atrial flutter, newly diagnosed cardiomyopathy, and elevated troponin. Patient was started on a diltiazem and heparin drip. Patient to possibly undergo cardioversion in a.m. if needed.  Review of Systems: As per HPI otherwise 10 point review of systems negative.   Past Medical History  Diagnosis Date  . Gout   . COPD (chronic obstructive pulmonary disease) ([place])   . Type 2 diabetes mellitus ([place])   . GERD (gastroesophageal reflux disease)   . Essential hypertension    . Hyperlipemia   . OA (osteoarthritis)   . Gastroparesis   . Adenomatous colon polyp 10/30/09    Serrated adenoma removed during Colonoscopy   . Diverticulosis of colon   . CKD (chronic kidney disease) stage 3, GFR 30-59 ml/min     Past Surgical History  Procedure Laterality Date  . Tonsillectomy    . Cataract extraction Right   . Esophagogastroduodenoscopy  10/30/09    [id number]   . Colonoscopy  10/30/09    [id number] diverticulum/serrated adenoma from ICV, next colonoscopy due 10/2012  . Colonoscopy N/A 09/18/2012    [id number] mucosa that was seen appeared normal, however most of it was not seen due to be very poor prep  . Colonoscopy N/A 04/08/2013    [id number] polyp removed/inadequate preparation  . Colonoscopy N/A 06/22/2014    Procedure: COLONOSCOPY;  Surgeon: Daneil Dolin, MD;  Location: [facility];  Service: Endoscopy;  Laterality: N/A;  115      reports that he quit smoking about 4 years ago. His smoking use included Cigarettes. He has a 4 pack-year smoking history. He does not have any smokeless tobacco history on file. He reports that he does not drink alcohol or use illicit drugs.  No Known Allergies  Family History  Problem Relation Age of Onset  . Diabetes Mother   . COPD Father   . Hypertension Mother   . Colon cancer Neg Hx     Prior to Admission medications   Medication Sig Start Date End Date Taking? Authorizing Provider  acetaminophen (TYLENOL) 500 MG tablet Take 1 tablet (500 mg total) by mouth every 6 (six) hours  as needed for moderate pain. 12/05/13   Sinda Du, MD  albuterol (PROAIR HFA) 108 (90 BASE) MCG/ACT inhaler Inhale 2 puffs into the lungs every 6 (six) hours as needed for wheezing or shortness of breath.     Historical Provider, MD  allopurinol (ZYLOPRIM) 300 MG tablet Take 300 mg by mouth daily.      Historical Provider, MD  amLODipine (NORVASC) 10 MG tablet Take 10 mg by mouth daily. 06/06/15   Historical Provider, MD    Cholecalciferol (VITAMIN D PO) Take 1,000 Units by mouth daily.    Historical Provider, MD  cloNIDine (CATAPRES) 0.2 MG tablet Take 0.2 mg by mouth 2 (two) times daily.     Historical Provider, MD  furosemide (LASIX) 40 MG tablet Take 1 tablet (40 mg total) by mouth daily. 12/05/13   Sinda Du, MD  gabapentin (NEURONTIN) 300 MG capsule Take 300 mg by mouth 2 (two) times daily. 08/05/15   Historical Provider, MD  gabapentin (NEURONTIN) 400 MG capsule Take 400 mg by mouth 2 (two) times daily. Takes at noon & bedtime    Historical Provider, MD  glimepiride (AMARYL) 2 MG tablet Take 2 mg by mouth daily before breakfast.      Historical Provider, MD  HUMALOG KWIKPEN 100 UNIT/ML KiwkPen Inject 8-15 Units into the skin 3 (three) times daily before meals. Per sliding scale 02/06/13   Historical Provider, MD  hydrALAZINE (APRESOLINE) 50 MG tablet Take 1 tablet (50 mg total) by mouth 2 (two) times daily. 12/05/13   Sinda Du, MD  HYDROcodone-acetaminophen (NORCO/VICODIN) 5-325 MG per tablet Take 1 tablet by mouth every 6 (six) hours as needed for moderate pain.     Historical Provider, MD  lanolin ointment Apply 1 application topically 2 (two) times daily as needed for dry skin.    Historical Provider, MD  LANTUS SOLOSTAR 100 UNIT/ML Solostar Pen Inject 15 Units into the skin every morning.  12/23/12   Historical Provider, MD  LINZESS 290 MCG CAPS capsule TAKE 1 CAPSULE ONCE DAILY 30 MINUTES BEFORE MEALS 05/08/15   Mahala Menghini, PA-C  metoprolol (LOPRESSOR) 100 MG tablet Take 100 mg by mouth 2 (two) times daily.  03/14/12   Historical Provider, MD  omeprazole (PRILOSEC) 20 MG capsule Take 20 mg by mouth daily.    Historical Provider, MD  simvastatin (ZOCOR) 10 MG tablet Take 10 mg by mouth daily.    Historical Provider, MD  TOUJEO SOLOSTAR 300 UNIT/ML SOPN Inject 15 Units into the skin at bedtime. 06/14/15   Historical Provider, MD  zolpidem (AMBIEN) 10 MG tablet Take 10 mg by mouth at bedtime.      Historical Provider, MD    Physical Exam: Filed Vitals:   08/14/15 1500 08/14/15 1523 08/14/15 1530 08/14/15 1600  BP: 113/89 113/89 124/88 101/82  Pulse: 122 121 120 57  Temp:      TempSrc:      Resp: 13 16 12 11   Height:      Weight:      SpO2: 92% 94% 91% 97%      Constitutional: Patient is obese male who appears to be somewhat lethargic, but arousable and able to follow commands. Filed Vitals:   08/14/15 1500 08/14/15 1523 08/14/15 1530 08/14/15 1600  BP: 113/89 113/89 124/88 101/82  Pulse: 122 121 120 57  Temp:      TempSrc:      Resp: 13 16 12 11   Height:      Weight:  SpO2: 92% 94% 91% 97%   Eyes: PERRL, lids and conjunctivae normal ENMT: Mucous membranes are moist. Posterior pharynx clear of any exudate or lesions.Normal dentition.  Neck: normal, supple, no masses, no thyromegaly Respiratory: Decreased aeration overall throughout both lung fields, no wheezing, no crackles. Normal respiratory effort. No accessory muscle use.  Cardiovascular: Regular rate and rhythm, no murmurs / rubs / gallops. +2-3 pitting bilateral lower extremity edema. 2+ pedal pulses. No carotid bruits.  Abdomen: no tenderness, no masses palpated. No hepatosplenomegaly. Bowel sounds positive.  Musculoskeletal: no clubbing / cyanosis. No joint deformity upper and lower extremities. Good ROM, no contractures. Normal muscle tone.  Skin: no rashes, lesions, ulcers. No induration Neurologic: CN 2-12 grossly intact. Sensation intact, DTR normal. Strength 5/5 in all 4.  Psychiatric: Normal judgment and insight. Lethargic, but oriented x 3. Normal mood.     Labs on Admission: I have personally reviewed following labs and imaging studies  CBC:  Recent Labs Lab 08/14/15 1444 08/14/15 1501  WBC  --  9.2  HGB 14.3 13.8  HCT 42.0 40.2  MCV  --  86.3  PLT  --  [id number]   Basic Metabolic Panel:  Recent Labs Lab 08/14/15 1444 08/14/15 1501  NA 140 138  K 3.1* 2.9*  CL 97* 100*  CO2  --  29   GLUCOSE 53* 52*  BUN 34* 35*  CREATININE 2.40* 2.33*  CALCIUM  --  10.1   GFR: Estimated Creatinine Clearance: 40.1 mL/min (by C-G formula based on Cr of 2.33). Liver Function Tests: No results for input(s): AST, ALT, ALKPHOS, BILITOT, PROT, ALBUMIN in the last 168 hours. No results for input(s): LIPASE, AMYLASE in the last 168 hours. No results for input(s): AMMONIA in the last 168 hours. Coagulation Profile: No results for input(s): INR, PROTIME in the last 168 hours. Cardiac Enzymes: No results for input(s): CKTOTAL, CKMB, CKMBINDEX, TROPONINI in the last 168 hours. BNP (last 3 results) No results for input(s): PROBNP in the last 8760 hours. HbA1C: No results for input(s): HGBA1C in the last 72 hours. CBG: No results for input(s): GLUCAP in the last 168 hours. Lipid Profile: No results for input(s): CHOL, HDL, LDLCALC, TRIG, CHOLHDL, LDLDIRECT in the last 72 hours. Thyroid Function Tests: No results for input(s): TSH, T4TOTAL, FREET4, T3FREE, THYROIDAB in the last 72 hours. Anemia Panel: No results for input(s): VITAMINB12, FOLATE, FERRITIN, TIBC, IRON, RETICCTPCT in the last 72 hours. Urine analysis:    Component Value Date/Time   COLORURINE YELLOW 12/02/2013 1739   APPEARANCEUR HAZY* 12/02/2013 1739   LABSPEC 1.020 12/02/2013 1739   PHURINE 5.0 12/02/2013 1739   GLUCOSEU 250* 12/02/2013 1739   HGBUR TRACE* 12/02/2013 1739   BILIRUBINUR NEGATIVE 12/02/2013 1739   KETONESUR NEGATIVE 12/02/2013 1739   PROTEINUR 30* 12/02/2013 1739   UROBILINOGEN 0.2 12/02/2013 1739   NITRITE NEGATIVE 12/02/2013 1739   LEUKOCYTESUR MODERATE* 12/02/2013 1739   Sepsis Labs: No results found for this or any previous visit (from the past 240 hour(s)).   Radiological Exams on Admission: Dg Chest 2 View  08/14/2015  CLINICAL DATA:  66 year old male with shortness of breath. EXAM: CHEST  2 VIEW COMPARISON:  Chest radiograph dated 12/02/2013 FINDINGS: The heart size and mediastinal contours  are within normal limits. Both lungs are clear. The visualized skeletal structures are unremarkable. IMPRESSION: No active cardiopulmonary disease. Electronically Signed   By: Anner Crete M.D.   On: 08/14/2015 15:52    EKG: Independently reviewed. Sinus tachycardia versus  atrial  flutter  Assessment/Plan Atrial flutter with rapid ventricular response ([place]): Acute. Patient with a few history of lower leg swelling and increased heart rates. Found to be in new onset atrial flutter no previous history of arrhythmias. Evaluated in the ED by Dr. Domenic Polite of cardiology. - Admit to stepdown - Continue diltiazem & heparin drip per pharmacyas tolerated - Cardiology recommended possible cardioversion in a.m. if needed and to keep patient nothing by mouth - Replacing electrolytes as needed. Checking magnesium level stat. Goal magnesium level 2 - check TSH - Appreciate cardiology assistance with the patient, and will follow-up with for further recommendations  Elevated troponin: Troponin initially elevated to 0.13 on admission. Question if this is secondary to demand ischemia EKG showing - Trend troponin q 3 hours x 3   Newly diagnosed cardiomyopathy / acute Systolic congestive heart failure: EF found to be around [id number] and LV diastolic function could not be adequately assessed on echo done today. - Check BNP - Strict I&O and daily weights - If BNP elevated, will give  Lasix   Hypokalemia: Patient's initial potassium was 2.9. - 60 mEq of potassium chloride stat - Goal potassium 4   Acute kidney injury on chronic kidney disease stage III: Patient's creatinine was acutely elevated to 2.40 with a BUN of 34. Baseline creatinine previously had been around 1.4-1.63 in 12/2013. It appears that his creatinine fluctuates and has been as high as 5.8 in the past. Question if acute symptoms are secondary to decompensated heart failure. - IV fluids on hold at this time - Avoid nephrotoxic agents. -  Consultation ordered to Dr. Lowanda Foster. Follow-up for further recommendations  - Follow-up CMP in a.m.  Hypoglycemia with Diabetes mellitus type 2: Acute. Blood glucoses 52 with confirmation on repeat blood work done on admission. Patient was given orange juice and a Coke in the ER. Repeat blood glucose 138. - Hypoglycemic protocols - check a hemoglobin A1c - Held Amaryl, [place], Long/short acting Insulin, restart when appropriate - CBGs q 4 hrs with a sensitive sliding-scale insulin for now, transitioned back to home regimen when able - Gabapentin will need to be adjusted  History of COPD: O2 saturations appear to be maintained on room air.  Chest x-ray showed no acute abnormalities. - DuoNeb's prn SOB or wheezing  Essential hypertension: Stable current time with blood pressures low-normal 100/82.  Currently, it appears that the patient may be be in an acute decompensated heart failure. - Held hydralazine, metoprolol, and clonidine while on the diltiazem drip   Hyperlipidemia - Check lipid panel - Continue simvastatin. Patient would likely benefit from being increased to a high potency statin  History of gout - Held allopurinol secondary to kidney function  Insomnia - Continue Zofran  GERD - Pharmacy substitution of Protonix changed to twice a day  DVT prophylaxis: On heparin drip Code Status: Full  Family Communication: Discussed plan with patient's wife present at bedside Disposition Plan: Possibly will need to stay in the hospital for 3-5 days Consults called: Cardiology Admission status: Inpatient Stepdown  Norval Morton MD [facility] Pager [phone]  If 7PM-7AM, please contact night-coverage [email or web address] Password [facility]  08/14/2015, 4:28 PM

## 2015-08-14 NOTE — ED Notes (Signed)
Pt given snack for blood glucose.

## 2015-08-14 NOTE — ED Provider Notes (Signed)
CSN: [id number]     Arrival date & time 08/14/15  1422 History   First MD Initiated Contact with Patient 08/14/15 1431     Chief Complaint  Patient presents with  . Tachycardia     (Consider location/radiation/quality/duration/timing/severity/associated sxs/prior Treatment) HPI Comments: 65 year old male with history of high blood pressure, COPD, renal failure presents from echo lab for further evaluation of tachycardia and worsening ejection fraction. Patient felt dizzy at the office and mild shortness of breath that is almost completely resolved. No chest pain today. No heart attack or known heart failure history. No history of atrial flutter known. Symptoms gradually improved  The history is provided by the patient.    Past Medical History  Diagnosis Date  . Gout   . COPD (chronic obstructive pulmonary disease) ([place])   . Type 2 diabetes mellitus ([place])   . GERD (gastroesophageal reflux disease)   . Essential hypertension   . Hyperlipemia   . OA (osteoarthritis)   . Gastroparesis   . Adenomatous colon polyp 10/30/09    Serrated adenoma removed during Colonoscopy   . Diverticulosis of colon   . CKD (chronic kidney disease) stage 3, GFR 30-59 ml/min    Past Surgical History  Procedure Laterality Date  . Tonsillectomy    . Cataract extraction Right   . Esophagogastroduodenoscopy  10/30/09    [id number]   . Colonoscopy  10/30/09    [id number] diverticulum/serrated adenoma from ICV, next colonoscopy due 10/2012  . Colonoscopy N/A 09/18/2012    [id number] mucosa that was seen appeared normal, however most of it was not seen due to be very poor prep  . Colonoscopy N/A 04/08/2013    [id number] polyp removed/inadequate preparation  . Colonoscopy N/A 06/22/2014    Procedure: COLONOSCOPY;  Surgeon: Daneil Dolin, MD;  Location: [facility];  Service: Endoscopy;  Laterality: N/A;  115    Family History  Problem Relation Age of Onset  . Diabetes Mother   . COPD Father   .  Hypertension Mother   . Colon cancer Neg Hx    Social History  Substance Use Topics  . Smoking status: Former Smoker -- 1.00 packs/day for 4 years    Types: Cigarettes    Quit date: 08/26/2010  . Smokeless tobacco: None  . Alcohol Use: No    Review of Systems  Constitutional: Negative for fever and chills.  HENT: Negative for congestion.   Eyes: Negative for visual disturbance.  Respiratory: Positive for shortness of breath.   Cardiovascular: Negative for chest pain.  Gastrointestinal: Negative for vomiting and abdominal pain.  Genitourinary: Negative for dysuria and flank pain.  Musculoskeletal: Negative for back pain, neck pain and neck stiffness.  Skin: Negative for rash.  Neurological: Positive for dizziness. Negative for light-headedness and headaches.      Allergies  Review of patient's allergies indicates no known allergies.  Home Medications   Prior to Admission medications   Medication Sig Start Date End Date Taking? Authorizing Provider  acetaminophen (TYLENOL) 500 MG tablet Take 1 tablet (500 mg total) by mouth every 6 (six) hours as needed for moderate pain. 12/05/13   Sinda Du, MD  albuterol (PROAIR HFA) 108 (90 BASE) MCG/ACT inhaler Inhale 2 puffs into the lungs every 6 (six) hours as needed for wheezing or shortness of breath.     Historical Provider, MD  allopurinol (ZYLOPRIM) 300 MG tablet Take 300 mg by mouth daily.      Historical Provider, MD  amLODipine (NORVASC) 10 MG tablet  Take 10 mg by mouth daily. 06/06/15   Historical Provider, MD  Cholecalciferol (VITAMIN D PO) Take 1,000 Units by mouth daily.    Historical Provider, MD  cloNIDine (CATAPRES) 0.2 MG tablet Take 0.2 mg by mouth 2 (two) times daily.     Historical Provider, MD  furosemide (LASIX) 40 MG tablet Take 1 tablet (40 mg total) by mouth daily. 12/05/13   Sinda Du, MD  gabapentin (NEURONTIN) 300 MG capsule Take 300 mg by mouth 2 (two) times daily. 08/05/15   Historical Provider, MD   gabapentin (NEURONTIN) 400 MG capsule Take 400 mg by mouth 2 (two) times daily. Takes at noon & bedtime    Historical Provider, MD  glimepiride (AMARYL) 2 MG tablet Take 2 mg by mouth daily before breakfast.      Historical Provider, MD  HUMALOG KWIKPEN 100 UNIT/ML KiwkPen Inject 8-15 Units into the skin 3 (three) times daily before meals. Per sliding scale 02/06/13   Historical Provider, MD  hydrALAZINE (APRESOLINE) 50 MG tablet Take 1 tablet (50 mg total) by mouth 2 (two) times daily. 12/05/13   Sinda Du, MD  HYDROcodone-acetaminophen (NORCO/VICODIN) 5-325 MG per tablet Take 1 tablet by mouth every 6 (six) hours as needed for moderate pain.     Historical Provider, MD  lanolin ointment Apply 1 application topically 2 (two) times daily as needed for dry skin.    Historical Provider, MD  LANTUS SOLOSTAR 100 UNIT/ML Solostar Pen Inject 15 Units into the skin every morning.  12/23/12   Historical Provider, MD  LINZESS 290 MCG CAPS capsule TAKE 1 CAPSULE ONCE DAILY 30 MINUTES BEFORE MEALS 05/08/15   Mahala Menghini, PA-C  metoprolol (LOPRESSOR) 100 MG tablet Take 100 mg by mouth 2 (two) times daily.  03/14/12   Historical Provider, MD  omeprazole (PRILOSEC) 20 MG capsule Take 20 mg by mouth daily.    Historical Provider, MD  simvastatin (ZOCOR) 10 MG tablet Take 10 mg by mouth daily.    Historical Provider, MD  TOUJEO SOLOSTAR 300 UNIT/ML SOPN Inject 15 Units into the skin at bedtime. 06/14/15   Historical Provider, MD  zolpidem (AMBIEN) 10 MG tablet Take 10 mg by mouth at bedtime.     Historical Provider, MD   BP 113/89 mmHg  Pulse 121  Temp(Src) 98.1 F (36.7 C) (Oral)  Resp 16  Ht 5\' 6"  (1.676 m)  Wt 271 lb (122.925 kg)  BMI 43.76 kg/m2  SpO2 94% Physical Exam  Constitutional: He is oriented to person, place, and time. He appears well-developed and well-nourished.  HENT:  Head: Normocephalic and atraumatic.  Eyes: Conjunctivae are normal. Right eye exhibits no discharge. Left eye exhibits  no discharge.  Neck: Normal range of motion. Neck supple. No tracheal deviation present.  Cardiovascular: Regular rhythm.  Tachycardia present.   Pulmonary/Chest: Effort normal and breath sounds normal.  Abdominal: Soft. He exhibits no distension. There is no tenderness. There is no guarding.  Musculoskeletal: He exhibits no edema.  Neurological: He is alert and oriented to person, place, and time.  Skin: Skin is warm. No rash noted.  Psychiatric: He has a normal mood and affect.  Nursing note and vitals reviewed.   ED Course  Procedures (including critical care time) CRITICAL CARE Performed by: Mariea Clonts   Total critical care time: 35 minutes  Critical care time was exclusive of separately billable procedures and treating other patients.  Critical care was necessary to treat or prevent imminent or life-threatening deterioration.  Critical  care was time spent personally by me on the following activities: development of treatment plan with patient and/or surrogate as well as nursing, discussions with consultants, evaluation of patient's response to treatment, examination of patient, obtaining history from patient or surrogate, ordering and performing treatments and interventions, ordering and review of laboratory studies, ordering and review of radiographic studies, pulse oximetry and re-evaluation of patient's condition.  Labs Review Labs Reviewed  I-STAT TROPOININ, ED - Abnormal; Notable for the following:    Troponin i, poc 0.13 (*)    All other components within normal limits  I-STAT CHEM 8, ED - Abnormal; Notable for the following:    Potassium 3.1 (*)    Chloride 97 (*)    BUN 34 (*)    Creatinine, Ser 2.40 (*)    Glucose, Bld 53 (*)    All other components within normal limits  CBC  BASIC METABOLIC PANEL    Imaging Review No results found. I have personally reviewed and evaluated these images and lab results as part of my medical decision-making.   EKG  Interpretation None     Initial EKG reviewed heart rate 119, atrial fibrillation with rapid ventricular response, mild ST elevation inferiorly with T wave abnormalities laterally.  Repeat EKG concern for atrial flutter versus sinus tachycardia, mild ST elevation inferiorly and T-wave inversion laterally. QT normal. MDM   Final diagnoses:  Atrial flutter with rapid ventricular response (HCC)  Dizziness  CRF (chronic renal failure), unspecified stage   Patient brought back emergently for abnormal EKG concerning for ischemia and atrial fibrillation/flutter. Patient is mild dizziness no chest pain or shortness breath currently.  Discuss with cardiology in reviewed EKG together. Plan to keep [facility] at [facility] and less patient develops active chest pain or troponin significantly elevated. Cardiology recommended medicine admit at [facility]. Diltiazem bolus/ drip ordered.  The patients results and plan were reviewed and discussed.   Any x-rays performed were independently reviewed by myself.   Differential diagnosis were considered with the presenting HPI.  Medications  0.9 %  sodium chloride infusion ( Intravenous New Bag/Given 08/14/15 1453)  diltiazem (CARDIZEM) 1 mg/mL load via infusion 20 mg (20 mg Intravenous Given 08/14/15 1528)    And  diltiazem (CARDIZEM) 100 mg in dextrose 5 % 100 mL (1 mg/mL) infusion (not administered)  aspirin chewable tablet 324 mg (not administered)  dextrose 5 % with diltiazem (CARDIZEM) ADS Med (  New Bag/Given 08/14/15 1527)    Filed Vitals:   08/14/15 1430 08/14/15 1523  BP: 141/89 113/89  Pulse: 123 121  Temp: 98.1 F (36.7 C)   TempSrc: Oral   Resp: 16 16  Height: 5\' 6"  (1.676 m)   Weight: 271 lb (122.925 kg)   SpO2: 100% 94%    Final diagnoses:  Atrial flutter with rapid ventricular response (HCC)  Dizziness  CRF (chronic renal failure), unspecified stage    Admission/ observation were discussed with the admitting physician, patient and/or  family and they are comfortable with the plan.      Elnora Morrison, MD 08/14/15 [id number]

## 2015-08-14 NOTE — ED Notes (Signed)
Pt states he was sent from the doctor's office for a fast heart rate.  Is not sure which doctor, but assuming [place]adu.  Pt states he was dizzy at the office, but denies complaints now.

## 2015-08-15 ENCOUNTER — Inpatient Hospital Stay (HOSPITAL_COMMUNITY): Payer: Medicare Other

## 2015-08-15 ENCOUNTER — Encounter (HOSPITAL_COMMUNITY): Payer: Self-pay | Admitting: Anesthesiology

## 2015-08-15 ENCOUNTER — Encounter (HOSPITAL_COMMUNITY)
Admission: EM | Disposition: A | Discharge status: Skilled Nursing Facility | Payer: Self-pay | Source: Home / Self Care | Attending: Internal Medicine

## 2015-08-15 DIAGNOSIS — A419 Sepsis, unspecified organism: Secondary | ICD-10-CM | POA: Diagnosis present

## 2015-08-15 DIAGNOSIS — J9601 Acute respiratory failure with hypoxia: Secondary | ICD-10-CM

## 2015-08-15 DIAGNOSIS — N179 Acute kidney failure, unspecified: Secondary | ICD-10-CM

## 2015-08-15 DIAGNOSIS — R6521 Severe sepsis with septic shock: Secondary | ICD-10-CM

## 2015-08-15 DIAGNOSIS — N189 Chronic kidney disease, unspecified: Secondary | ICD-10-CM

## 2015-08-15 DIAGNOSIS — R7989 Other specified abnormal findings of blood chemistry: Secondary | ICD-10-CM

## 2015-08-15 DIAGNOSIS — J9600 Acute respiratory failure, unspecified whether with hypoxia or hypercapnia: Secondary | ICD-10-CM | POA: Diagnosis present

## 2015-08-15 DIAGNOSIS — G9340 Encephalopathy, unspecified: Secondary | ICD-10-CM | POA: Diagnosis present

## 2015-08-15 LAB — BLOOD CULTURE ID PANEL (REFLEXED)
Acinetobacter baumannii: NOT DETECTED
Candida albicans: NOT DETECTED
Candida glabrata: NOT DETECTED
Candida krusei: NOT DETECTED
Candida parapsilosis: NOT DETECTED
Candida tropicalis: NOT DETECTED
Carbapenem resistance: NOT DETECTED
Enterobacter cloacae complex: NOT DETECTED
Enterobacteriaceae species: DETECTED — AB
Enterococcus species: NOT DETECTED
Escherichia coli: NOT DETECTED
Haemophilus influenzae: NOT DETECTED
Klebsiella oxytoca: NOT DETECTED
Klebsiella pneumoniae: NOT DETECTED
Listeria monocytogenes: NOT DETECTED
Methicillin resistance: NOT DETECTED
Neisseria meningitidis: NOT DETECTED
Proteus species: NOT DETECTED
Pseudomonas aeruginosa: NOT DETECTED
Serratia marcescens: NOT DETECTED
Staphylococcus aureus (BCID): NOT DETECTED
Staphylococcus species: NOT DETECTED
Streptococcus agalactiae: NOT DETECTED
Streptococcus pneumoniae: NOT DETECTED
Streptococcus pyogenes: NOT DETECTED
Streptococcus species: NOT DETECTED
Vancomycin resistance: NOT DETECTED

## 2015-08-15 LAB — POCT I-STAT 3, ART BLOOD GAS (G3+)
Acid-base deficit: 13 mmol/L — ABNORMAL HIGH (ref 0.0–2.0)
Acid-base deficit: 11 mmol/L — ABNORMAL HIGH (ref 0.0–2.0)
Bicarbonate: 15.5 mEq/L — ABNORMAL LOW (ref 20.0–24.0)
Bicarbonate: 13.8 mEq/L — ABNORMAL LOW (ref 20.0–24.0)
O2 Saturation: 90 %
O2 Saturation: 98 %
Patient temperature: 98.7
Patient temperature: 98.4
TCO2: 17 mmol/L (ref 0–100)
TCO2: 15 mmol/L (ref 0–100)
pCO2 arterial: 46.5 mmHg — ABNORMAL HIGH (ref 35.0–45.0)
pCO2 arterial: 28.6 mmHg — ABNORMAL LOW (ref 35.0–45.0)
pH, Arterial: 7.131 — CL (ref 7.350–7.450)
pH, Arterial: 7.291 — ABNORMAL LOW (ref 7.350–7.450)
pO2, Arterial: 78 mmHg — ABNORMAL LOW (ref 80.0–100.0)
pO2, Arterial: 122 mmHg — ABNORMAL HIGH (ref 80.0–100.0)

## 2015-08-15 LAB — CBC WITH DIFFERENTIAL/PLATELET
Basophils Absolute: 0 10*3/uL (ref 0.0–0.1)
Basophils Relative: 0 %
Eosinophils Absolute: 0 10*3/uL (ref 0.0–0.7)
Eosinophils Relative: 0 %
HCT: 36.1 % — ABNORMAL LOW (ref 39.0–52.0)
Hemoglobin: 11.7 g/dL — ABNORMAL LOW (ref 13.0–17.0)
Lymphocytes Relative: 9 %
Lymphs Abs: 1.6 10*3/uL (ref 0.7–4.0)
MCH: 29.3 pg (ref 26.0–34.0)
MCHC: 32.4 g/dL (ref 30.0–36.0)
MCV: 90.5 fL (ref 78.0–100.0)
Monocytes Absolute: 0.7 10*3/uL (ref 0.1–1.0)
Monocytes Relative: 4 %
Neutro Abs: 15.4 10*3/uL — ABNORMAL HIGH (ref 1.7–7.7)
Neutrophils Relative %: 87 %
Platelets: 201 10*3/uL (ref 150–400)
RBC: 3.99 MIL/uL — ABNORMAL LOW (ref 4.22–5.81)
RDW: 15.3 % (ref 11.5–15.5)
WBC: 17.7 10*3/uL — ABNORMAL HIGH (ref 4.0–10.5)

## 2015-08-15 LAB — BLOOD GAS, ARTERIAL
Acid-base deficit: 4.4 mmol/L — ABNORMAL HIGH (ref 0.0–2.0)
Bicarbonate: 21.1 mEq/L (ref 20.0–24.0)
Drawn by: 234301
O2 Content: 4 L/min
O2 Saturation: 91.1 %
Patient temperature: 37
pCO2 arterial: 29.8 mmHg — ABNORMAL LOW (ref 35.0–45.0)
pH, Arterial: 7.425 (ref 7.350–7.450)
pO2, Arterial: 61.6 mmHg — ABNORMAL LOW (ref 80.0–100.0)

## 2015-08-15 LAB — LIPID PANEL
Cholesterol: 79 mg/dL (ref 0–200)
HDL: 34 mg/dL — ABNORMAL LOW (ref >40)
LDL Cholesterol: 37 mg/dL (ref 0–99)
Total CHOL/HDL Ratio: 2.3 RATIO
Triglycerides: 38 mg/dL (ref <150)
VLDL: 8 mg/dL (ref 0–40)

## 2015-08-15 LAB — BASIC METABOLIC PANEL
Anion gap: 17 — ABNORMAL HIGH (ref 5–15)
BUN: 49 mg/dL — ABNORMAL HIGH (ref 6–20)
CO2: 15 mmol/L — ABNORMAL LOW (ref 22–32)
Calcium: 7.9 mg/dL — ABNORMAL LOW (ref 8.9–10.3)
Chloride: 99 mmol/L — ABNORMAL LOW (ref 101–111)
Creatinine, Ser: 4.6 mg/dL — ABNORMAL HIGH (ref 0.61–1.24)
GFR calc Af Amer: 14 mL/min — ABNORMAL LOW (ref >60)
GFR calc non Af Amer: 12 mL/min — ABNORMAL LOW (ref >60)
Glucose, Bld: 306 mg/dL — ABNORMAL HIGH (ref 65–99)
Potassium: 5.3 mmol/L — ABNORMAL HIGH (ref 3.5–5.1)
Sodium: 131 mmol/L — ABNORMAL LOW (ref 135–145)

## 2015-08-15 LAB — COMPREHENSIVE METABOLIC PANEL
ALT: 24 U/L (ref 17–63)
AST: 44 U/L — ABNORMAL HIGH (ref 15–41)
Albumin: 3.4 g/dL — ABNORMAL LOW (ref 3.5–5.0)
Alkaline Phosphatase: 64 U/L (ref 38–126)
Anion gap: 15 (ref 5–15)
BUN: 44 mg/dL — ABNORMAL HIGH (ref 6–20)
CO2: 20 mmol/L — ABNORMAL LOW (ref 22–32)
Calcium: 9 mg/dL (ref 8.9–10.3)
Chloride: 101 mmol/L (ref 101–111)
Creatinine, Ser: 3.35 mg/dL — ABNORMAL HIGH (ref 0.61–1.24)
GFR calc Af Amer: 21 mL/min — ABNORMAL LOW (ref >60)
GFR calc non Af Amer: 18 mL/min — ABNORMAL LOW (ref >60)
Glucose, Bld: 179 mg/dL — ABNORMAL HIGH (ref 65–99)
Potassium: 3.4 mmol/L — ABNORMAL LOW (ref 3.5–5.1)
Sodium: 136 mmol/L (ref 135–145)
Total Bilirubin: 0.7 mg/dL (ref 0.3–1.2)
Total Protein: 5.9 g/dL — ABNORMAL LOW (ref 6.5–8.1)

## 2015-08-15 LAB — GLUCOSE, CAPILLARY
Glucose-Capillary: 138 mg/dL — ABNORMAL HIGH (ref 65–99)
Glucose-Capillary: 145 mg/dL — ABNORMAL HIGH (ref 65–99)
Glucose-Capillary: 264 mg/dL — ABNORMAL HIGH (ref 65–99)

## 2015-08-15 LAB — CARBOXYHEMOGLOBIN
Carboxyhemoglobin: 0.9 % (ref 0.5–1.5)
Methemoglobin: 1.1 % (ref 0.0–1.5)
O2 Saturation: 59.5 %
Total hemoglobin: 11.9 g/dL — ABNORMAL LOW (ref 13.5–18.0)

## 2015-08-15 LAB — LACTIC ACID, PLASMA
Lactic Acid, Venous: 4.7 mmol/L (ref 0.5–2.0)
Lactic Acid, Venous: 5.1 mmol/L (ref 0.5–2.0)

## 2015-08-15 LAB — PROCALCITONIN: Procalcitonin: 1.42 ng/mL

## 2015-08-15 LAB — HEMOGLOBIN AND HEMATOCRIT, BLOOD
HCT: 34.9 % — ABNORMAL LOW (ref 39.0–52.0)
Hemoglobin: 11.4 g/dL — ABNORMAL LOW (ref 13.0–17.0)

## 2015-08-15 LAB — TROPONIN I
Troponin I: 0.23 ng/mL — ABNORMAL HIGH (ref <0.031)
Troponin I: 0.26 ng/mL — ABNORMAL HIGH (ref <0.031)

## 2015-08-15 LAB — HEPARIN LEVEL (UNFRACTIONATED): Heparin Unfractionated: 0.38 IU/mL (ref 0.30–0.70)

## 2015-08-15 SURGERY — CARDIOVERSION
Anesthesia: Monitor Anesthesia Care

## 2015-08-15 MED ORDER — VANCOMYCIN HCL 10 G IV SOLR: 1500.0000 mg | INTRAVENOUS | Status: DC

## 2015-08-15 MED ORDER — SODIUM CHLORIDE 0.9 % IV BOLUS (SEPSIS)
250.0000 mL | Freq: Once | INTRAVENOUS | Status: AC
  Administered 2015-08-15: 250 mL via INTRAVENOUS

## 2015-08-15 MED ORDER — FENTANYL BOLUS VIA INFUSION
50.0000 ug | INTRAVENOUS | Status: DC | PRN
  Filled 2015-08-15: qty 50

## 2015-08-15 MED ORDER — FENTANYL CITRATE (PF) 100 MCG/2ML IJ SOLN: 100.0000 ug | INTRAMUSCULAR | Status: DC | PRN

## 2015-08-15 MED ORDER — FENTANYL CITRATE (PF) 100 MCG/2ML IJ SOLN
100.0000 ug | INTRAMUSCULAR | Status: DC | PRN
  Administered 2015-08-15: 100 ug via INTRAVENOUS

## 2015-08-15 MED ORDER — PROPOFOL 10 MG/ML IV BOLUS
INTRAVENOUS | Status: AC
  Filled 2015-08-15: qty 20

## 2015-08-15 MED ORDER — VASOPRESSIN 20 UNIT/ML IV SOLN
0.0300 [IU]/min | INTRAVENOUS | Status: DC
  Administered 2015-08-15 – 2015-08-17 (×3): 0.03 [IU]/min via INTRAVENOUS
  Filled 2015-08-15 (×3): qty 2

## 2015-08-15 MED ORDER — ACETAMINOPHEN 325 MG PO TABS
650.0000 mg | ORAL_TABLET | ORAL | Status: DC | PRN
Start: 2015-08-15 — End: 2015-08-17
  Administered 2015-08-15 – 2015-08-17 (×3): 650 mg via ORAL
  Filled 2015-08-15 (×3): qty 2

## 2015-08-15 MED ORDER — METOPROLOL TARTRATE 25 MG PO TABS: 25.0000 mg | ORAL_TABLET | Freq: Two times a day (BID) | ORAL | Status: DC

## 2015-08-15 MED ORDER — CHLORHEXIDINE GLUCONATE 0.12% ORAL RINSE (MEDLINE KIT)
15.0000 mL | Freq: Two times a day (BID) | OROMUCOSAL | Status: DC
  Administered 2015-08-15 – 2015-08-22 (×13): 15 mL via OROMUCOSAL

## 2015-08-15 MED ORDER — SODIUM BICARBONATE 8.4 % IV SOLN
INTRAVENOUS | Status: AC
  Administered 2015-08-15: 50 meq
  Filled 2015-08-15: qty 50

## 2015-08-15 MED ORDER — FENTANYL CITRATE (PF) 100 MCG/2ML IJ SOLN
INTRAMUSCULAR | Status: AC
  Filled 2015-08-15: qty 4

## 2015-08-15 MED ORDER — IPRATROPIUM-ALBUTEROL 0.5-2.5 (3) MG/3ML IN SOLN: 3.0000 mL | RESPIRATORY_TRACT | Status: DC | PRN

## 2015-08-15 MED ORDER — ANTISEPTIC ORAL RINSE SOLUTION (CORINZ)
7.0000 mL | Freq: Four times a day (QID) | OROMUCOSAL | Status: DC
  Administered 2015-08-16 – 2015-08-18 (×13): 7 mL via OROMUCOSAL

## 2015-08-15 MED ORDER — MIDAZOLAM HCL 2 MG/2ML IJ SOLN: 2.0000 mg | INTRAMUSCULAR | Status: DC | PRN

## 2015-08-15 MED ORDER — SODIUM BICARBONATE 8.4 % IV SOLN
INTRAVENOUS | Status: AC
  Administered 2015-08-15 – 2015-08-16 (×2): via INTRAVENOUS
  Filled 2015-08-15 (×3): qty 150

## 2015-08-15 MED ORDER — FENTANYL CITRATE (PF) 100 MCG/2ML IJ SOLN
100.0000 ug | Freq: Once | INTRAMUSCULAR | Status: AC
  Administered 2015-08-15: 100 ug via INTRAVENOUS

## 2015-08-15 MED ORDER — MIDAZOLAM HCL 2 MG/2ML IJ SOLN
INTRAMUSCULAR | Status: AC
  Filled 2015-08-15: qty 4

## 2015-08-15 MED ORDER — SODIUM CHLORIDE 0.9 % IV BOLUS (SEPSIS)
500.0000 mL | Freq: Once | INTRAVENOUS | Status: AC
  Administered 2015-08-15: 500 mL via INTRAVENOUS

## 2015-08-15 MED ORDER — FENTANYL CITRATE (PF) 100 MCG/2ML IJ SOLN
INTRAMUSCULAR | Status: AC
  Filled 2015-08-15: qty 2

## 2015-08-15 MED ORDER — PIPERACILLIN-TAZOBACTAM 3.375 G IVPB
3.3750 g | Freq: Three times a day (TID) | INTRAVENOUS | Status: DC
  Administered 2015-08-15: 3.375 g via INTRAVENOUS
  Filled 2015-08-15 (×3): qty 50

## 2015-08-15 MED ORDER — INSULIN ASPART 100 UNIT/ML ~~LOC~~ SOLN: 0.0000 [IU] | SUBCUTANEOUS | Status: DC

## 2015-08-15 MED ORDER — GLUCAGON HCL RDNA (DIAGNOSTIC) 1 MG IJ SOLR
3.0000 mg/h | INTRAVENOUS | Status: DC
  Filled 2015-08-15: qty 5

## 2015-08-15 MED ORDER — DOCUSATE SODIUM 50 MG/5ML PO LIQD
100.0000 mg | Freq: Two times a day (BID) | ORAL | Status: DC | PRN
  Filled 2015-08-15: qty 10

## 2015-08-15 MED ORDER — ACETAMINOPHEN 650 MG RE SUPP: 650.0000 mg | RECTAL | Status: DC | PRN

## 2015-08-15 MED ORDER — DEXTROSE 5 % IV SOLN
0.5000 ug/min | INTRAVENOUS | Status: DC
  Administered 2015-08-15: 2 ug/min via INTRAVENOUS
  Filled 2015-08-15: qty 4

## 2015-08-15 MED ORDER — METOPROLOL TARTRATE 50 MG PO TABS: 50.0000 mg | ORAL_TABLET | Freq: Two times a day (BID) | ORAL | Status: DC

## 2015-08-15 MED ORDER — SODIUM CHLORIDE 0.9 % IV SOLN
2000.0000 mg | Freq: Once | INTRAVENOUS | Status: AC
  Administered 2015-08-15: 2000 mg via INTRAVENOUS
  Filled 2015-08-15: qty 2000

## 2015-08-15 MED ORDER — ACETAMINOPHEN 650 MG RE SUPP
650.0000 mg | RECTAL | Status: DC | PRN
  Administered 2015-08-15: 650 mg via RECTAL
  Filled 2015-08-15 (×2): qty 1

## 2015-08-15 MED ORDER — AMIODARONE HCL IN DEXTROSE 360-4.14 MG/200ML-% IV SOLN: 30.0000 mg/h | INTRAVENOUS | Status: DC | PRN

## 2015-08-15 MED ORDER — FENTANYL CITRATE (PF) 100 MCG/2ML IJ SOLN: 50.0000 ug | Freq: Once | INTRAMUSCULAR | Status: DC

## 2015-08-15 MED ORDER — INSULIN ASPART 100 UNIT/ML ~~LOC~~ SOLN: 2.0000 [IU] | SUBCUTANEOUS | Status: DC

## 2015-08-15 MED ORDER — PROCHLORPERAZINE EDISYLATE 5 MG/ML IJ SOLN
10.0000 mg | INTRAMUSCULAR | Status: DC | PRN
  Administered 2015-08-15: 10 mg via INTRAVENOUS
  Filled 2015-08-15 (×2): qty 2

## 2015-08-15 MED ORDER — DEXTROSE 5 % IV SOLN
1.0000 g | INTRAVENOUS | Status: DC
  Filled 2015-08-15: qty 1

## 2015-08-15 MED ORDER — DOPAMINE-DEXTROSE 3.2-5 MG/ML-% IV SOLN
INTRAVENOUS | Status: AC
Start: 2015-08-15 — End: 2015-08-15
  Filled 2015-08-15: qty 250

## 2015-08-15 MED ORDER — VANCOMYCIN HCL IN DEXTROSE 1-5 GM/200ML-% IV SOLN
1000.0000 mg | INTRAVENOUS | Status: AC
  Administered 2015-08-15: 1000 mg via INTRAVENOUS

## 2015-08-15 MED ORDER — MIDAZOLAM HCL 2 MG/2ML IJ SOLN
INTRAMUSCULAR | Status: AC
  Administered 2015-08-15: 16:00:00
  Filled 2015-08-15: qty 2

## 2015-08-15 MED ORDER — EPINEPHRINE HCL 0.1 MG/ML IJ SOSY
PREFILLED_SYRINGE | INTRAMUSCULAR | Status: AC
  Filled 2015-08-15: qty 10

## 2015-08-15 MED ORDER — DEXTROSE 5 % IV SOLN
2.0000 g | Freq: Once | INTRAVENOUS | Status: AC
  Administered 2015-08-15: 2 g via INTRAVENOUS
  Filled 2015-08-15: qty 2

## 2015-08-15 MED ORDER — NOREPINEPHRINE BITARTRATE 1 MG/ML IV SOLN
0.0000 ug/min | INTRAVENOUS | Status: DC
  Administered 2015-08-15: 2 ug/min via INTRAVENOUS
  Filled 2015-08-15 (×2): qty 4

## 2015-08-15 MED ORDER — SODIUM CHLORIDE 0.9 % IV SOLN
25.0000 ug/h | INTRAVENOUS | Status: DC
  Administered 2015-08-15 – 2015-08-18 (×3): 50 ug/h via INTRAVENOUS
  Administered 2015-08-19: 100 ug/h via INTRAVENOUS
  Filled 2015-08-15 (×3): qty 50

## 2015-08-15 MED ORDER — AMIODARONE HCL IN DEXTROSE 360-4.14 MG/200ML-% IV SOLN: 60.0000 mg/h | INTRAVENOUS | Status: DC | PRN

## 2015-08-15 MED ORDER — DOPAMINE-DEXTROSE 3.2-5 MG/ML-% IV SOLN: 0.0000 ug/kg/min | INTRAVENOUS | Status: DC

## 2015-08-15 MED ORDER — BISACODYL 10 MG RE SUPP: 10.0000 mg | Freq: Every day | RECTAL | Status: DC | PRN

## 2015-08-15 MED ORDER — NOREPINEPHRINE BITARTRATE 1 MG/ML IV SOLN
0.0000 ug/min | INTRAVENOUS | Status: DC
  Administered 2015-08-15: 50 ug/min via INTRAVENOUS
  Administered 2015-08-15: 15 ug/min via INTRAVENOUS
  Administered 2015-08-16: 35 ug/min via INTRAVENOUS
  Administered 2015-08-16: 45 ug/min via INTRAVENOUS
  Administered 2015-08-16: 35 ug/min via INTRAVENOUS
  Administered 2015-08-16: 50 ug/min via INTRAVENOUS
  Administered 2015-08-17: 35 ug/min via INTRAVENOUS
  Administered 2015-08-18: 2 ug/min via INTRAVENOUS
  Filled 2015-08-15 (×6): qty 16

## 2015-08-15 MED ORDER — FAMOTIDINE IN NACL 20-0.9 MG/50ML-% IV SOLN
20.0000 mg | INTRAVENOUS | Status: DC
  Administered 2015-08-15 – 2015-08-21 (×7): 20 mg via INTRAVENOUS
  Filled 2015-08-15 (×7): qty 50

## 2015-08-15 NOTE — Progress Notes (Signed)
Vent changes made per MD verbal order based on ABG results.

## 2015-08-15 NOTE — Progress Notes (Signed)
Called report to [employer], RN on 2H at [facility]. Verbalized understanding. Pt transferred to facility via [place].

## 2015-08-15 NOTE — Progress Notes (Signed)
Lab called to report gram negative rods growing in aerobic bottle. Patient in transit to [facility] at this time. Crystal, RN (receiving RN) notified of lab result.

## 2015-08-15 NOTE — Progress Notes (Signed)
PHARMACY - PHYSICIAN COMMUNICATION CRITICAL VALUE ALERT - BLOOD CULTURE IDENTIFICATION (BCID)  Results for orders placed or performed during the hospital encounter of 08/14/15  Blood Culture ID Panel (Reflexed) (Collected: 08/14/2015 11:30 PM)  Result Value Ref Range   Enterococcus species NOT DETECTED NOT DETECTED   Vancomycin resistance NOT DETECTED NOT DETECTED   Listeria monocytogenes NOT DETECTED NOT DETECTED   Staphylococcus species NOT DETECTED NOT DETECTED   Staphylococcus aureus NOT DETECTED NOT DETECTED   Methicillin resistance NOT DETECTED NOT DETECTED   Streptococcus species NOT DETECTED NOT DETECTED   Streptococcus agalactiae NOT DETECTED NOT DETECTED   Streptococcus pneumoniae NOT DETECTED NOT DETECTED   Streptococcus pyogenes NOT DETECTED NOT DETECTED   Acinetobacter baumannii NOT DETECTED NOT DETECTED   Enterobacteriaceae species DETECTED (A) NOT DETECTED   Enterobacter cloacae complex NOT DETECTED NOT DETECTED   Escherichia coli NOT DETECTED NOT DETECTED   Klebsiella oxytoca NOT DETECTED NOT DETECTED   Klebsiella pneumoniae NOT DETECTED NOT DETECTED   Proteus species NOT DETECTED NOT DETECTED   Serratia marcescens NOT DETECTED NOT DETECTED   Carbapenem resistance NOT DETECTED NOT DETECTED   Haemophilus influenzae NOT DETECTED NOT DETECTED   Neisseria meningitidis NOT DETECTED NOT DETECTED   Pseudomonas aeruginosa NOT DETECTED NOT DETECTED   Candida albicans NOT DETECTED NOT DETECTED   Candida glabrata NOT DETECTED NOT DETECTED   Candida krusei NOT DETECTED NOT DETECTED   Candida parapsilosis NOT DETECTED NOT DETECTED   Candida tropicalis NOT DETECTED NOT DETECTED    Name of physician (or Provider) Contacted: Dr. Ashby Dawes  Changes to prescribed antibiotics required: Discontinue zosyn (enterobacter susceptibility of ~ 75%) and vancomycin. Begin cefepime.  Hildred Laser, Pharm D 08/15/2015 7:54 PM

## 2015-08-15 NOTE — Progress Notes (Signed)
Consulting cardiologist: Dr. Satira Sark  Seen for followup: Atrial flutter, cardiomyopathy  Subjective:    Lethargic this AM but does arouse. No chest pain or palpitations. No obvious focal pain otherwise.  Objective:   Temp:  [97.1 F (36.2 C)-104.1 F (40.1 C)] 102.8 F (39.3 C) (06/13 0900) Pulse Rate:  [38-131] 41 (06/13 0700) Resp:  [11-31] 28 (06/13 0800) BP: (71-240)/(50-182) 74/53 mmHg (06/13 0800) SpO2:  [79 %-100 %] 95 % (06/13 0700) Weight:  [260 lb 9.3 oz (118.2 kg)-271 lb (122.925 kg)] 260 lb 12.9 oz (118.3 kg) (06/13 0500) Last BM Date: 08/13/15  Filed Weights   08/14/15 1430 08/14/15 1732 08/15/15 0500  Weight: 271 lb (122.925 kg) 260 lb 9.3 oz (118.2 kg) 260 lb 12.9 oz (118.3 kg)    Intake/Output Summary (Last 24 hours) at 08/15/15 0954 Last data filed at 08/15/15 0600  Gross per 24 hour  Intake 662.92 ml  Output    500 ml  Net 162.92 ml    Telemetry: Atrial flutter with 3:1 block and controlled rate.  Exam:  General: Obese male, lethargic.  Lungs: Diminished breath sounds, no wheezes.  Cardiac: Indistinct PMI. Distant largely regular.  Abdomen: Obese, no guarding or rebound.  Extremities: 2-3+ leg edema.  Lab Results:  Basic Metabolic Panel:  Recent Labs Lab 08/14/15 1501 08/14/15 2301 08/15/15 0422  NA 138 136 136  K 2.9* 4.0 3.4*  CL 100* 99* 101  CO2 29 21* 20*  GLUCOSE 52* 263* 179*  BUN 35* 37* 44*  CREATININE 2.33* 2.41* 3.35*  CALCIUM 10.1 9.6 9.0  MG 2.1 1.8  --     Liver Function Tests:  Recent Labs Lab 08/15/15 0422  AST 44*  ALT 24  ALKPHOS 64  BILITOT 0.7  PROT 5.9*  ALBUMIN 3.4*    CBC:  Recent Labs Lab 08/14/15 1444 08/14/15 1501  WBC  --  9.2  HGB 14.3 13.8  HCT 42.0 40.2  MCV  --  86.3  PLT  --  367    Cardiac Enzymes:  Recent Labs Lab 08/14/15 1827 08/14/15 2120 08/14/15 2301  TROPONINI 0.13* 0.15* 0.14*    Coagulation:  Recent Labs Lab 08/14/15 1601  INR 1.12     Echocardiogram 08/14/2015: Study Conclusions  - Left ventricle: The cavity size was normal. Wall thickness was  increased in a pattern of mild LVH. The estimated ejection  fraction was 25%. Study telemetry shows a regular tachycardia  suggestive of atrial flutter. Diffuse hypokinesis. Probable  akinesis of the inferoseptal myocardium. The study is not  technically sufficient to allow evaluation of LV diastolic  function. - Aortic valve: Mildly calcified annulus. Trileaflet. - Mitral valve: There was trivial regurgitation. - Left atrium: The atrium was mildly dilated. - Right ventricle: Systolic function was moderately reduced. - Right atrium: The atrium was at the upper limits of normal in  size. Central venous pressure (est): 8 mm Hg. - Atrial septum: There was a patent foramen ovale. Bidirectional  shunting noted by color Doppler. - Tricuspid valve: There was mild regurgitation. - Pulmonary arteries: PA peak pressure: 31 mm Hg (S). - Pericardium, extracardiac: There was no pericardial effusion.  Impressions:  - Mild LVH with LVEF approximately 25%, diffuse hypokinesis with  probable akinesis of the inferior septum. Left ventricular  dysfunction is new in comparison to the previous study from 2015.  Study telemetry looks to show atrial flutter with RVR.  Indeterminate diastolic function. Mild left atrial enlargement.  Trivial mitral  regurgitation. Moderately reduced RV contraction.  Mild tricuspid regurgitation with PASP 31 mmHg. 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   Medications:   Scheduled Medications: . cholecalciferol  1,000 Units Oral Daily  . insulin aspart  0-9 Units Subcutaneous TID WC  . linaclotide  290 mcg Oral QAC breakfast  . metoprolol tartrate  100 mg Oral BID  . pantoprazole  40 mg Oral Daily  . piperacillin-tazobactam (ZOSYN)  IV  3.375 g Intravenous Q8H  . simvastatin  10 mg Oral q1800  . sodium chloride flush   3 mL Intravenous Q12H  . sodium chloride flush  3 mL Intravenous Q12H  . zolpidem  10 mg Oral QHS    Infusions: . sodium chloride    . diltiazem (CARDIZEM) infusion 2.5 mg/hr (08/15/15 0600)  . heparin 1,300 Units/hr (08/15/15 0841)  . norepinephrine (LEVOPHED) Adult infusion 18 mcg/min (08/15/15 0947)    PRN Medications: sodium chloride, acetaminophen, acetaminophen, acetaminophen, amiodarone, HYDROcodone-acetaminophen, hydrocortisone cream, ipratropium-albuterol, ondansetron (ZOFRAN) IV, prochlorperazine, sodium chloride flush, sodium chloride flush   Assessment:   Substantial change in clinical status since assessment yesterday. Foley catheter was reportedly not able to be inserted due to resistance. Did not tolerate CPAP with emesis. Subsequently developed rigors with elevated heart rate and spike in temperature up to 104. Subsequent development of hypotension. Lactic acid 5.1. Assessed by hospitalist team overnight, also input from CCM. Now on sepsis protocol with broad-spectrum antibiotics, blood cultures drawn, started on levofloxacin this morning and I had nursing stop his Cardizem GTT. Follow-up chest x-ray shows shallow inspiration but no obvious infiltrates or edema. TEE cardioversion is obvious canceled.  1. Sepsis syndrome with high fevers, onset within the last 12 hours, hypotension now on Levophed, elevated lactic acid, blood cultures pending. He is on sepsis protocol with broad-spectrum antibiotics and is being transferred to [facility] by Dr. Legrand Rams for further ICU care and management by CCM.  2. Newly documented typical atrial flutter of uncertain duration. Heart rate has come down, recently with 3:1 block. Cardizem discontinued in light of hypotension with sepsis. Original plan was for TEE guided cardioversion, although with substantial change in clinical status, this needs to be deferred for the time being. CHADSVASC score is 3. He is on heparin.  3. Elevated  troponin I, mild and in flat pattern not suggestive of ACS. Demand ischemia in the setting of active comorbidities.  4. Newly documented cardiomyopathy with LVEF approximately 25% as outlined  above, biventricular failure noted. Possibly tachycardia-mediated in light of atrial flutter. Can work up further in terms of ischemic etiology later depending on clinical progress.  5. Acute on chronic renal failure, CKD stage 3 at baseline, creatinine up to 3.4. With difficulty placing urinary catheter question bladder outlet obstruction. He is followed by Dr. Lowanda Foster.   Plan/Discussion:    As noted canceling TEE cardioversion for now in light of significant change in clinical status with concern for sepsis. Agree with transfer to ICU at [facility] for further management by CCM and follow-up per cardiology team. Cardizem drip stopped. He is now on Levophed and broad-spectrum antibiotics. Suspect he will require intubation. Continue heparin for now.   Satira Sark, M.D., F.A.C.C.

## 2015-08-15 NOTE — Progress Notes (Signed)
Subjective: Patient was admitted last night due to atrial fibrillation with RVR and possible sepsis. Patient was started on combination IV antibiotics. This morning patient is less responsive and hypotensive. I have spoke patient condition with family. I have called Dr. Marily Lente from [facility] ICU and discussed the condition. He has accepted the patient for transfer. However, he advised to do ABG before transfer to evaluate if patient require intubation.  Objective: Vital signs in last 24 hours: Temp:  [97.1 F (36.2 C)-104.1 F (40.1 C)] 100.7 F (38.2 C) (06/13 0746) Pulse Rate:  [38-131] 48 (06/13 0630) Resp:  [11-31] 24 (06/13 0630) BP: (71-240)/(54-182) 71/54 mmHg (06/13 0630) SpO2:  [79 %-100 %] 91 % (06/13 0630) Weight:  [118.2 kg (260 lb 9.3 oz)-122.925 kg (271 lb)] 118.3 kg (260 lb 12.9 oz) (06/13 0500) Weight change:  Last BM Date: 08/13/15  Intake/Output from previous day: 06/12 0701 - 06/13 0700 In: 662.9 [P.O.:540; I.V.:122.9] Out: 500 [Urine:500]  PHYSICAL EXAM General appearance: fatigued, no distress and slowed mentation Resp: diminished breath sounds bilaterally and rhonchi bilaterally Cardio: irregularly irregular rhythm GI: soft, non-tender; bowel sounds normal; no masses,  no organomegaly Extremities: extremities normal, atraumatic, no cyanosis or edema  Lab Results:  Results for orders placed or performed during the hospital encounter of 08/14/15 (from the past 48 hour(s))  I-stat troponin, ED (0, 3, 6)  not at [facility]     Status: Abnormal   Collection Time: 08/14/15  2:42 PM  Result Value Ref Range   Troponin i, poc 0.13 (HH) 0.00 - 0.08 ng/mL   Comment 3            Comment: Due to the release kinetics of cTnI, a negative result within the first hours of the onset of symptoms does not rule out myocardial infarction with certainty. If myocardial infarction is still suspected, repeat the test at appropriate intervals.   I-stat chem 8, ed     Status: Abnormal    Collection Time: 08/14/15  2:44 PM  Result Value Ref Range   Sodium 140 135 - 145 mmol/L   Potassium 3.1 (L) 3.5 - 5.1 mmol/L   Chloride 97 (L) 101 - 111 mmol/L   BUN 34 (H) 6 - 20 mg/dL   Creatinine, Ser 2.40 (H) 0.61 - 1.24 mg/dL   Glucose, Bld 53 (L) 65 - 99 mg/dL   Calcium, Ion 1.26 1.13 - 1.30 mmol/L   TCO2 27 0 - 100 mmol/L   Hemoglobin 14.3 13.0 - 17.0 g/dL   HCT 42.0 39.0 - 52.0 %  CBC     Status: None   Collection Time: 08/14/15  3:01 PM  Result Value Ref Range   WBC 9.2 4.0 - 10.5 K/uL   RBC 4.66 4.22 - 5.81 MIL/uL   Hemoglobin 13.8 13.0 - 17.0 g/dL   HCT 40.2 39.0 - 52.0 %   MCV 86.3 78.0 - 100.0 fL   MCH 29.6 26.0 - 34.0 pg   MCHC 34.3 30.0 - 36.0 g/dL   RDW 14.3 11.5 - 15.5 %   Platelets 367 150 - 400 K/uL  Basic metabolic panel     Status: Abnormal   Collection Time: 08/14/15  3:01 PM  Result Value Ref Range   Sodium 138 135 - 145 mmol/L   Potassium 2.9 (L) 3.5 - 5.1 mmol/L   Chloride 100 (L) 101 - 111 mmol/L   CO2 29 22 - 32 mmol/L   Glucose, Bld 52 (L) 65 - 99 mg/dL  BUN 35 (H) 6 - 20 mg/dL   Creatinine, Ser 2.33 (H) 0.61 - 1.24 mg/dL   Calcium 10.1 8.9 - 10.3 mg/dL   GFR calc non Af Amer 28 (L) >60 mL/min   GFR calc Af Amer 33 (L) >60 mL/min    Comment: (NOTE) The eGFR has been calculated using the CKD EPI equation. This calculation has not been validated in all clinical situations. eGFR's persistently <60 mL/min signify possible Chronic Kidney Disease.    Anion gap 9 5 - 15  Magnesium     Status: None   Collection Time: 08/14/15  3:01 PM  Result Value Ref Range   Magnesium 2.1 1.7 - 2.4 mg/dL  Brain natriuretic peptide     Status: Abnormal   Collection Time: 08/14/15  3:01 PM  Result Value Ref Range   B Natriuretic Peptide 609.0 (H) 0.0 - 100.0 pg/mL  Protime-INR     Status: None   Collection Time: 08/14/15  4:01 PM  Result Value Ref Range   Prothrombin Time 14.6 11.6 - 15.2 seconds   INR 1.12 0.00 - 1.49  CBG monitoring, ED     Status:  Abnormal   Collection Time: 08/14/15  4:50 PM  Result Value Ref Range   Glucose-Capillary 138 (H) 65 - 99 mg/dL  Glucose, capillary     Status: Abnormal   Collection Time: 08/14/15  5:20 PM  Result Value Ref Range   Glucose-Capillary 173 (H) 65 - 99 mg/dL   Comment 1 Notify RN    Comment 2 Document in Chart   Troponin I (q 6hr x 3)     Status: Abnormal   Collection Time: 08/14/15  6:27 PM  Result Value Ref Range   Troponin I 0.13 (H) <0.031 ng/mL    Comment:        PERSISTENTLY INCREASED TROPONIN VALUES IN THE RANGE OF 0.04-0.49 ng/mL CAN BE SEEN IN:       -UNSTABLE ANGINA       -CONGESTIVE HEART FAILURE       -MYOCARDITIS       -CHEST TRAUMA       -ARRYHTHMIAS       -LATE PRESENTING MYOCARDIAL INFARCTION       -COPD   CLINICAL FOLLOW-UP RECOMMENDED.   TSH     Status: None   Collection Time: 08/14/15  6:27 PM  Result Value Ref Range   TSH 1.917 0.350 - 4.500 uIU/mL  Troponin I (q 6hr x 3)     Status: Abnormal   Collection Time: 08/14/15  9:20 PM  Result Value Ref Range   Troponin I 0.15 (H) <0.031 ng/mL    Comment:        PERSISTENTLY INCREASED TROPONIN VALUES IN THE RANGE OF 0.04-0.49 ng/mL CAN BE SEEN IN:       -UNSTABLE ANGINA       -CONGESTIVE HEART FAILURE       -MYOCARDITIS       -CHEST TRAUMA       -ARRYHTHMIAS       -LATE PRESENTING MYOCARDIAL INFARCTION       -COPD   CLINICAL FOLLOW-UP RECOMMENDED.   Glucose, capillary     Status: Abnormal   Collection Time: 08/14/15  9:20 PM  Result Value Ref Range   Glucose-Capillary 338 (H) 65 - 99 mg/dL  Urinalysis, Routine w reflex microscopic (not at [facility])     Status: Abnormal   Collection Time: 08/14/15  9:25 PM  Result Value Ref Range  Color, Urine AMBER (A) YELLOW    Comment: BIOCHEMICALS MAY BE AFFECTED BY COLOR   APPearance HAZY (A) CLEAR   Specific Gravity, Urine 1.015 1.005 - 1.030   pH 5.0 5.0 - 8.0   Glucose, UA 100 (A) NEGATIVE mg/dL   Hgb urine dipstick LARGE (A) NEGATIVE   Bilirubin Urine  NEGATIVE NEGATIVE   Ketones, ur NEGATIVE NEGATIVE mg/dL   Protein, ur 100 (A) NEGATIVE mg/dL   Nitrite NEGATIVE NEGATIVE   Leukocytes, UA MODERATE (A) NEGATIVE  MRSA PCR Screening     Status: None   Collection Time: 08/14/15  9:25 PM  Result Value Ref Range   MRSA by PCR NEGATIVE NEGATIVE    Comment:        The GeneXpert MRSA Assay (FDA approved for NASAL specimens only), is one component of a comprehensive MRSA colonization surveillance program. It is not intended to diagnose MRSA infection nor to guide or monitor treatment for MRSA infections.   Urine microscopic-add on     Status: Abnormal   Collection Time: 08/14/15  9:25 PM  Result Value Ref Range   Squamous Epithelial / LPF 0-5 (A) NONE SEEN   WBC, UA 0-5 0 - 5 WBC/hpf   RBC / HPF TOO NUMEROUS TO COUNT 0 - 5 RBC/hpf   Bacteria, UA MANY (A) NONE SEEN  Heparin level (unfractionated)     Status: None   Collection Time: 08/14/15 11:01 PM  Result Value Ref Range   Heparin Unfractionated 0.36 0.30 - 0.70 IU/mL    Comment:        IF HEPARIN RESULTS ARE BELOW EXPECTED VALUES, AND PATIENT DOSAGE HAS BEEN CONFIRMED, SUGGEST FOLLOW UP TESTING OF ANTITHROMBIN III LEVELS.   Troponin I (q 6hr x 3)     Status: Abnormal   Collection Time: 08/14/15 11:01 PM  Result Value Ref Range   Troponin I 0.14 (H) <0.031 ng/mL    Comment:        PERSISTENTLY INCREASED TROPONIN VALUES IN THE RANGE OF 0.04-0.49 ng/mL CAN BE SEEN IN:       -UNSTABLE ANGINA       -CONGESTIVE HEART FAILURE       -MYOCARDITIS       -CHEST TRAUMA       -ARRYHTHMIAS       -LATE PRESENTING MYOCARDIAL INFARCTION       -COPD   CLINICAL FOLLOW-UP RECOMMENDED.   Basic metabolic panel     Status: Abnormal   Collection Time: 08/14/15 11:01 PM  Result Value Ref Range   Sodium 136 135 - 145 mmol/L   Potassium 4.0 3.5 - 5.1 mmol/L    Comment: DELTA CHECK NOTED   Chloride 99 (L) 101 - 111 mmol/L   CO2 21 (L) 22 - 32 mmol/L   Glucose, Bld 263 (H) 65 - 99 mg/dL    BUN 37 (H) 6 - 20 mg/dL   Creatinine, Ser 2.41 (H) 0.61 - 1.24 mg/dL   Calcium 9.6 8.9 - 10.3 mg/dL   GFR calc non Af Amer 27 (L) >60 mL/min   GFR calc Af Amer 31 (L) >60 mL/min    Comment: (NOTE) The eGFR has been calculated using the CKD EPI equation. This calculation has not been validated in all clinical situations. eGFR's persistently <60 mL/min signify possible Chronic Kidney Disease.    Anion gap 16 (H) 5 - 15  Magnesium     Status: None   Collection Time: 08/14/15 11:01 PM  Result Value Ref Range  Magnesium 1.8 1.7 - 2.4 mg/dL  Glucose, capillary     Status: Abnormal   Collection Time: 08/14/15 11:13 PM  Result Value Ref Range   Glucose-Capillary 276 (H) 65 - 99 mg/dL  Culture, blood (routine x 2)     Status: None (Preliminary result)   Collection Time: 08/14/15 11:30 PM  Result Value Ref Range   Specimen Description BLOOD RIGHT HAND    Special Requests BOTTLES DRAWN AEROBIC AND ANAEROBIC 4CC EACH    Culture PENDING    Report Status PENDING   Lactic acid, plasma     Status: Abnormal   Collection Time: 08/14/15 11:40 PM  Result Value Ref Range   Lactic Acid, Venous 5.1 (HH) 0.5 - 2.0 mmol/L    Comment: CRITICAL RESULT CALLED TO, READ BACK BY AND VERIFIED WITH: WAGONER R AT 0010 ON [id number] BY FORSYTH K   Procalcitonin     Status: None   Collection Time: 08/14/15 11:40 PM  Result Value Ref Range   Procalcitonin 1.42 ng/mL    Comment:        Interpretation: PCT > 0.5 ng/mL and <= 2 ng/mL: Systemic infection (sepsis) is possible, but other conditions are known to elevate PCT as well. (NOTE)         ICU PCT Algorithm               Non ICU PCT Algorithm    ----------------------------     ------------------------------         PCT < 0.25 ng/mL                 PCT < 0.1 ng/mL     Stopping of antibiotics            Stopping of antibiotics       strongly encouraged.               strongly encouraged.    ----------------------------     ------------------------------        PCT level decrease by               PCT < 0.25 ng/mL       >= 80% from peak PCT       OR PCT 0.25 - 0.5 ng/mL          Stopping of antibiotics                                             encouraged.     Stopping of antibiotics           encouraged.    ----------------------------     ------------------------------       PCT level decrease by              PCT >= 0.25 ng/mL       < 80% from peak PCT        AND PCT >= 0.5 ng/mL             Continuing antibiotics                                              encouraged.       Continuing antibiotics  encouraged.    ----------------------------     ------------------------------     PCT level increase compared          PCT > 0.5 ng/mL         with peak PCT AND          PCT >= 0.5 ng/mL             Escalation of antibiotics                                          strongly encouraged.      Escalation of antibiotics        strongly encouraged.   Culture, blood (routine x 2)     Status: None (Preliminary result)   Collection Time: 08/14/15 11:45 PM  Result Value Ref Range   Specimen Description BLOOD LEFT HAND    Special Requests BOTTLES DRAWN AEROBIC AND ANAEROBIC 3CC EACH    Culture PENDING    Report Status PENDING   Lactic acid, plasma     Status: Abnormal   Collection Time: 08/15/15  2:02 AM  Result Value Ref Range   Lactic Acid, Venous 4.7 (HH) 0.5 - 2.0 mmol/L    Comment: CRITICAL RESULT CALLED TO, READ BACK BY AND VERIFIED WITH: WAGONER R AT 0253 ON [id number] BY FORSYTH K   Comprehensive metabolic panel     Status: Abnormal   Collection Time: 08/15/15  4:22 AM  Result Value Ref Range   Sodium 136 135 - 145 mmol/L   Potassium 3.4 (L) 3.5 - 5.1 mmol/L   Chloride 101 101 - 111 mmol/L   CO2 20 (L) 22 - 32 mmol/L   Glucose, Bld 179 (H) 65 - 99 mg/dL   BUN 44 (H) 6 - 20 mg/dL   Creatinine, Ser 3.35 (H) 0.61 - 1.24 mg/dL   Calcium 9.0 8.9 - 10.3 mg/dL   Total Protein 5.9 (L) 6.5 - 8.1 g/dL   Albumin 3.4 (L) 3.5 - 5.0  g/dL   AST 44 (H) 15 - 41 U/L   ALT 24 17 - 63 U/L   Alkaline Phosphatase 64 38 - 126 U/L   Total Bilirubin 0.7 0.3 - 1.2 mg/dL   GFR calc non Af Amer 18 (L) >60 mL/min   GFR calc Af Amer 21 (L) >60 mL/min    Comment: (NOTE) The eGFR has been calculated using the CKD EPI equation. This calculation has not been validated in all clinical situations. eGFR's persistently <60 mL/min signify possible Chronic Kidney Disease.    Anion gap 15 5 - 15  Heparin level (unfractionated)     Status: None   Collection Time: 08/15/15  4:22 AM  Result Value Ref Range   Heparin Unfractionated 0.38 0.30 - 0.70 IU/mL    Comment:        IF HEPARIN RESULTS ARE BELOW EXPECTED VALUES, AND PATIENT DOSAGE HAS BEEN CONFIRMED, SUGGEST FOLLOW UP TESTING OF ANTITHROMBIN III LEVELS.   Lipid panel     Status: Abnormal   Collection Time: 08/15/15  4:22 AM  Result Value Ref Range   Cholesterol 79 0 - 200 mg/dL   Triglycerides 38 <150 mg/dL   HDL 34 (L) >40 mg/dL   Total CHOL/HDL Ratio 2.3 RATIO   VLDL 8 0 - 40 mg/dL   LDL Cholesterol 37 0 - 99 mg/dL    Comment:  Total Cholesterol/HDL:CHD Risk Coronary Heart Disease Risk Table                     Men   Women  1/2 Average Risk   3.4   3.3  Average Risk       5.0   4.4  2 X Average Risk   9.6   7.1  3 X Average Risk  23.4   11.0        Use the calculated Patient Ratio above and the CHD Risk Table to determine the patient's CHD Risk.        ATP III CLASSIFICATION (LDL):  <100     mg/dL   Optimal  100-129  mg/dL   Near or Above                    Optimal  130-159  mg/dL   Borderline  160-189  mg/dL   High  >190     mg/dL   Very High     ABGS  Recent Labs  08/14/15 1444  TCO2 27   CULTURES Recent Results (from the past 240 hour(s))  MRSA PCR Screening     Status: None   Collection Time: 08/14/15  9:25 PM  Result Value Ref Range Status   MRSA by PCR NEGATIVE NEGATIVE Final    Comment:        The GeneXpert MRSA Assay (FDA approved  for NASAL specimens only), is one component of a comprehensive MRSA colonization surveillance program. It is not intended to diagnose MRSA infection nor to guide or monitor treatment for MRSA infections.   Culture, blood (routine x 2)     Status: None (Preliminary result)   Collection Time: 08/14/15 11:30 PM  Result Value Ref Range Status   Specimen Description BLOOD RIGHT HAND  Final   Special Requests BOTTLES DRAWN AEROBIC AND ANAEROBIC 4CC EACH  Final   Culture PENDING  Incomplete   Report Status PENDING  Incomplete  Culture, blood (routine x 2)     Status: None (Preliminary result)   Collection Time: 08/14/15 11:45 PM  Result Value Ref Range Status   Specimen Description BLOOD LEFT HAND  Final   Special Requests BOTTLES DRAWN AEROBIC AND ANAEROBIC 3CC EACH  Final   Culture PENDING  Incomplete   Report Status PENDING  Incomplete   Studies/Results: Dg Chest 2 View  08/14/2015  CLINICAL DATA:  65 year old male with shortness of breath. EXAM: CHEST  2 VIEW COMPARISON:  Chest radiograph dated 12/02/2013 FINDINGS: The heart size and mediastinal contours are within normal limits. Both lungs are clear. The visualized skeletal structures are unremarkable. IMPRESSION: No active cardiopulmonary disease. Electronically Signed   By: Anner Crete M.D.   On: 08/14/2015 15:52   Dg Chest Port 1 View  08/14/2015  CLINICAL DATA:  Shortness of breath. Altered mental status. Bilateral leg edema. EXAM: PORTABLE CHEST 1 VIEW COMPARISON:  08/14/2015 FINDINGS: Examination is technically limited due to motion artifact. Shallow inspiration. Mild cardiac enlargement without vascular congestion. No focal airspace disease or consolidation in the lungs. No blunting of costophrenic angles. No pneumothorax. IMPRESSION: Shallow inspiration.  No evidence of active pulmonary disease. Electronically Signed   By: Lucienne Capers M.D.   On: 08/14/2015 23:42    Medications: I have reviewed the patient's current  medications.  Assesment:   Principal Problem:   Atrial flutter with rapid ventricular response (HCC) Active Problems:   GERD   DM (diabetes mellitus) ([place])  HTN (hypertension)   Hyperlipemia   Sleep apnea   Systolic CHF ([place]) Sepsis with hypotension  Plan: Medications reviewed Continue iv antibiotics Will do stat ABG Will give bolus fluid Will arrange transfer to [facility] ICU for critical care management.    LOS: 1 day   Najai Waszak 08/15/2015, 8:28 AM

## 2015-08-15 NOTE — Progress Notes (Addendum)
Called to the bedside because the patient with increased heart rate to 732'Q systolic blood pressures >567. Patient complaining of feeling cold and rigidly shaking. Temperature noted to be as high as 104F axillary. Sepsis protocol was initiated and patient was started on vancomycin and Zosyn. Due to the patient's being in acute CHF. Normal sepsis bolus was not initiated. Patient received 500 mL approximally with antibiotic fluid. Lab work revealed that potassium corrected 2.9-->4, latic acid elevated at 5.1, blood sugar was 263. A Foley catheter was attempted initially been unsuccessful and patient had subsequent penile bleeding thereafter. UA appears to show many bacteria, moderate leukocytes, negative nitrates, 0-5 WBC, and 0-5 squamous epithelial cells. Condom catheter was placed instead and is currently draining red urine. Treating as sepsis of unknown source at this time. Question possible urinary tract infection as patient now states that he may have had burning when he urinated for the last 2-3 days. Renal ultrasound ordered as well. Discussed patient care with PCCM who recommended the addition of amiodarone, gently give fluid bolus, and trend lactic acid levels

## 2015-08-15 NOTE — Progress Notes (Signed)
ANTICOAGULATION CONSULT NOTE   Pharmacy Consult for heparin Indication: atrial fibrillation  No Known Allergies  Patient Measurements: Height: 5\' 6"  (167.6 cm) Weight: 260 lb 12.9 oz (118.3 kg) IBW/kg (Calculated) : 63.8 HEPARIN DW (KG): 91.3  Vital Signs: Temp: 100.7 F (38.2 C) (06/13 0746) Temp Source: Axillary (06/13 0746) BP: 71/54 mmHg (06/13 0630) Pulse Rate: 48 (06/13 0630)  Labs:  Recent Labs  08/14/15 1444 08/14/15 1501 08/14/15 1601 08/14/15 1827 08/14/15 2120 08/14/15 2301 08/15/15 0422  HGB 14.3 13.8  --   --   --   --   --   HCT 42.0 40.2  --   --   --   --   --   PLT  --  367  --   --   --   --   --   LABPROT  --   --  14.6  --   --   --   --   INR  --   --  1.12  --   --   --   --   HEPARINUNFRC  --   --   --   --   --  0.36 0.38  CREATININE 2.40* 2.33*  --   --   --  2.41* 3.35*  TROPONINI  --   --   --  0.13* 0.15* 0.14*  --     Estimated Creatinine Clearance: 27.3 mL/min (by C-G formula based on Cr of 3.35).   Medical History: Past Medical History  Diagnosis Date  . Gout   . COPD (chronic obstructive pulmonary disease) ([place])   . Type 2 diabetes mellitus ([place])   . GERD (gastroesophageal reflux disease)   . Essential hypertension   . Hyperlipemia   . OA (osteoarthritis)   . Gastroparesis   . Adenomatous colon polyp 10/30/09    Serrated adenoma removed during Colonoscopy   . Diverticulosis of colon   . CKD (chronic kidney disease) stage 3, GFR 30-59 ml/min     Medications:  See med rec   Assessment: Newly diagnosed atrial flutter with RVR. Patient has experienced  leg edema and intermittent shortness of breath over the last few weeks to months. CHADSVASC score is 3. Heparin level this am is therapeutic 0.38 units/ml.  Goal of Therapy:  Heparin level 0.3-0.7 units/ml Monitor platelets by anticoagulation protocol: Yes   Plan:  Continue heparin at current rate. F/u later today for plan  Thanks for allowing pharmacy to be a part of  this patient's care.  Excell Seltzer, PharmD Clinical Pharmacist   08/15/2015,8:47 AM

## 2015-08-15 NOTE — Procedures (Signed)
Central Venous Catheter Insertion Procedure Note Frank Hoover [id number] May 04, 1950  Procedure: Insertion of Central Venous Catheter Indications: Assessment of intravascular volume, Drug and/or fluid administration and Frequent blood sampling  Procedure Details Consent: Unable to obtain consent because of emergent medical necessity. Time Out: Verified patient identification, verified procedure, site/side was marked, verified correct patient position, special equipment/implants available, medications/allergies/relevent history reviewed, required imaging and test results available.  Performed  Maximum sterile technique was used including antiseptics, cap, gloves, gown, hand hygiene, mask and sheet. Skin prep: Chlorhexidine; local anesthetic administered A antimicrobial bonded/coated triple lumen catheter was placed in the right internal jugular vein using the Seldinger technique. Ultrasound guidance used.Yes.   Catheter placed to 16 cm. Blood aspirated via all 3 ports and then flushed x 3. Line sutured x 2 and dressing applied.  Evaluation Blood flow good Complications: No apparent complications Patient did tolerate procedure well. Chest X-ray ordered to verify placement.  CXR: pending.  Georgann Housekeeper, AGACNP-BC [place] [facility] Pager [phone] or [phone]  08/15/2015 6:32 PM  Rush Farmer, M.D. [facility] Pulmonary/Critical Care Medicine. Pager: [phone]. After hours pager: [phone].

## 2015-08-15 NOTE — Procedures (Signed)
Central Venous Catheter Insertion Procedure Note JOVI ZAVADIL [id number] 1950/04/15  Procedure: Insertion of Central Venous Catheter Indications: Assessment of intravascular volume, Drug and/or fluid administration and Frequent blood sampling  Procedure Details Consent: Risks of procedure as well as the alternatives and risks of each were explained to the (patient/caregiver).  Consent for procedure obtained. Time Out: Verified patient identification, verified procedure, site/side was marked, verified correct patient position, special equipment/implants available, medications/allergies/relevent history reviewed, required imaging and test results available.  Performed  Maximum sterile technique was used including antiseptics, cap, gloves, gown, hand hygiene, mask and sheet. Skin prep: Chlorhexidine; local anesthetic administered A antimicrobial bonded/coated triple lumen catheter was placed in the right femoral vein due to emergent situation using the Seldinger technique.  Evaluation Blood flow good Complications: No apparent complications Patient did tolerate procedure well.   Nygel Prokop A 08/15/2015, 1:22 PM

## 2015-08-15 NOTE — Anesthesia Preprocedure Evaluation (Deleted)
Anesthesia Evaluation  Patient identified by MRN, date of birth, ID band Patient awake    Reviewed: Allergy & Precautions, NPO status , Patient's Chart, lab work & pertinent test results  Airway        Dental   Pulmonary sleep apnea , COPD, former smoker,           Cardiovascular hypertension, Pt. on medications +CHF  + dysrhythmias Atrial Fibrillation      Neuro/Psych    GI/Hepatic GERD  ,  Endo/Other  diabetes  Renal/GU Renal InsufficiencyRenal disease     Musculoskeletal   Abdominal   Peds  Hematology   Anesthesia Other Findings   Reproductive/Obstetrics                             Anesthesia Physical Anesthesia Plan  ASA: III  Anesthesia Plan: MAC   Post-op Pain Management:    Induction: Intravenous  Airway Management Planned: Simple Face Mask  Additional Equipment:   Intra-op Plan:   Post-operative Plan:   Informed Consent: I have reviewed the patients History and Physical, chart, labs and discussed the procedure including the risks, benefits and alternatives for the proposed anesthesia with the patient or authorized representative who has indicated his/her understanding and acceptance.     Plan Discussed with:   Anesthesia Plan Comments:         Anesthesia Quick Evaluation

## 2015-08-15 NOTE — Consult Note (Signed)
Urology Consult  Referring physician: [name], MD Reason for referral: Urethral false passage  Chief Complaint: Urethral false passage  History of Present Illness:  65 year old male with PMH of HTN, COPD, former smoker, DM type 2, GERD, HLD, OA, CKD stage 3, diverticulosis, and gout admitted at [facility] on 6/12 with new onset tachycardia found to be in atrial flutter with rapid ventricular response.   Earlier today at [facility] due to concern for sepsis of urinary origin and acute on chronic kidney disease, multiple unsuccessful attempts at foley catheter placement including per report a straight 74F and 63F foley with return of blood per urethra. Ultimately, they were unable to place a foley catheter and a condom catheter was placed. It is not clear if he was making any urine at this time. He was transferred to [facility] where multiple additional attempts at catheter placement were performed including at 74F straight and 74F coude by the ICU nurses as well as an attempt by one of the renal nurses. There was significant bleeding per urethra following all these attempts and no output of urine. His most recent bladder scan demonstrated between 200-400 cc.  Throughout the events of today and being transferred to [facility] he was intubated, had multiple lines placed and started on pressors. His urine last night was inconclusive for a possible UTI with moderate LE, negative nitrites, TNTC RBCs but only 0-5 WBCs and no bacteria and UCx from 6/12 returned NGF. He does have Enterobacteriaceae species growing in 1/2 of his BCx from 6/12.  His baseline creatinine is 1.4-1.6 and his creatinine on admission to [facility] on 6/12 was 2.4. Since this time it has increased to 4.6 the evening of 6/13.  He has a renal ultrasound earlier today which demonstrated mild right renal atrophy and increased echogenicity of the parenchyma consistent with medical renal disease without associated hydronephrosis and a mildly  distended bladder.   All history was obtained from review of the medical records and the patient's nurse as the patient was intubated and sedated.   Past Medical History  Diagnosis Date  . Gout   . COPD (chronic obstructive pulmonary disease) ([place])   . Type 2 diabetes mellitus ([place])   . GERD (gastroesophageal reflux disease)   . Essential hypertension   . Hyperlipemia   . OA (osteoarthritis)   . Gastroparesis   . Adenomatous colon polyp 10/30/09    Serrated adenoma removed during Colonoscopy   . Diverticulosis of colon   . CKD (chronic kidney disease) stage 3, GFR 30-59 ml/min    Past Surgical History  Procedure Laterality Date  . Tonsillectomy    . Cataract extraction Right   . Esophagogastroduodenoscopy  10/30/09    ZOX:WRUEAV   . Colonoscopy  10/30/09    WUJ:[id number]-[id number] diverticulum/serrated adenoma from ICV, next colonoscopy due 10/2012  . Colonoscopy N/A 09/18/2012    NFA:OZHYQMV mucosa that was seen appeared normal, however most of it was not seen due to be very poor prep  . Colonoscopy N/A 04/08/2013    HQI:ONGEXBM polyp removed/inadequate preparation  . Colonoscopy N/A 06/22/2014    Procedure: COLONOSCOPY;  Surgeon: Daneil Dolin, MD;  Location: [facility];  Service: Endoscopy;  Laterality: N/A;  115     Medications: I have reviewed the patient's current medications. Allergies: No Known Allergies  Family History  Problem Relation Age of Onset  . Diabetes Mother   . COPD Father   . Hypertension Mother   . Colon cancer  Neg Hx    Social History:  reports that he quit smoking about 4 years ago. His smoking use included Cigarettes. He has a 4 pack-year smoking history. He does not have any smokeless tobacco history on file. He reports that he does not drink alcohol or use illicit drugs.  ROS - Review of systems was not obtainable as patient was intubated   Physical Exam:  Vital signs in last 24 hours: Temp:  [97.7 F (36.5 C)-104.1 F (40.1 C)] 97.7 F (36.5 C)  (06/13 1450) Pulse Rate:  [41-131] 121 (06/13 1915) Resp:  [16-36] 35 (06/13 2100) BP: (64-240)/(49-182) 133/69 mmHg (06/13 1915) SpO2:  [79 %-100 %] 100 % (06/13 2100) Arterial Line BP: (99-193)/(62-87) 111/67 mmHg (06/13 2100) FiO2 (%):  [100 %] 100 % (06/13 1915) Weight:  [118.3 kg (260 lb 12.9 oz)-120 kg (264 lb 8.8 oz)] 120 kg (264 lb 8.8 oz) (06/13 1450) Physical Exam General: Intubated and sedated HEENT: [place]/AT, intubated Lungs: Mechanical breath sounds  Abdomen: Obese, soft, non-distended, non-tender, bladder is not palpable. Genitourinary: Uncircumcised male phallus with large amount of blood present at the meatus Musculoskeletal: No acute deformities  Skin: Warm/dry, LE edema +1-2  Laboratory Data:  Results for orders placed or performed during the hospital encounter of 08/14/15 (from the past 72 hour(s))  I-stat troponin, ED (0, 3, 6)  not at [facility]     Status: Abnormal   Collection Time: 08/14/15  2:42 PM  Result Value Ref Range   Troponin i, poc 0.13 (HH) 0.00 - 0.08 ng/mL   Comment 3            Comment: Due to the release kinetics of cTnI, a negative result within the first hours of the onset of symptoms does not rule out myocardial infarction with certainty. If myocardial infarction is still suspected, repeat the test at appropriate intervals.   I-stat chem 8, ed     Status: Abnormal   Collection Time: 08/14/15  2:44 PM  Result Value Ref Range   Sodium 140 135 - 145 mmol/L   Potassium 3.1 (L) 3.5 - 5.1 mmol/L   Chloride 97 (L) 101 - 111 mmol/L   BUN 34 (H) 6 - 20 mg/dL   Creatinine, Ser 2.40 (H) 0.61 - 1.24 mg/dL   Glucose, Bld 53 (L) 65 - 99 mg/dL   Calcium, Ion 1.26 1.13 - 1.30 mmol/L   TCO2 27 0 - 100 mmol/L   Hemoglobin 14.3 13.0 - 17.0 g/dL   HCT 42.0 39.0 - 52.0 %  CBC     Status: None   Collection Time: 08/14/15  3:01 PM  Result Value Ref Range   WBC 9.2 4.0 - 10.5 K/uL   RBC 4.66 4.22 - 5.81 MIL/uL   Hemoglobin 13.8 13.0 - 17.0 g/dL    HCT 40.2 39.0 - 52.0 %   MCV 86.3 78.0 - 100.0 fL   MCH 29.6 26.0 - 34.0 pg   MCHC 34.3 30.0 - 36.0 g/dL   RDW 14.3 11.5 - 15.5 %   Platelets 367 150 - 400 K/uL  Basic metabolic panel     Status: Abnormal   Collection Time: 08/14/15  3:01 PM  Result Value Ref Range   Sodium 138 135 - 145 mmol/L   Potassium 2.9 (L) 3.5 - 5.1 mmol/L   Chloride 100 (L) 101 - 111 mmol/L   CO2 29 22 - 32 mmol/L   Glucose, Bld 52 (L) 65 - 99 mg/dL   BUN 35 (H)  6 - 20 mg/dL   Creatinine, Ser 2.33 (H) 0.61 - 1.24 mg/dL   Calcium 10.1 8.9 - 10.3 mg/dL   GFR calc non Af Amer 28 (L) >60 mL/min   GFR calc Af Amer 33 (L) >60 mL/min    Comment: (NOTE) The eGFR has been calculated using the CKD EPI equation. This calculation has not been validated in all clinical situations. eGFR's persistently <60 mL/min signify possible Chronic Kidney Disease.    Anion gap 9 5 - 15  Magnesium     Status: None   Collection Time: 08/14/15  3:01 PM  Result Value Ref Range   Magnesium 2.1 1.7 - 2.4 mg/dL  Brain natriuretic peptide     Status: Abnormal   Collection Time: 08/14/15  3:01 PM  Result Value Ref Range   B Natriuretic Peptide 609.0 (H) 0.0 - 100.0 pg/mL  Protime-INR     Status: None   Collection Time: 08/14/15  4:01 PM  Result Value Ref Range   Prothrombin Time 14.6 11.6 - 15.2 seconds   INR 1.12 0.00 - 1.49  CBG monitoring, ED     Status: Abnormal   Collection Time: 08/14/15  4:50 PM  Result Value Ref Range   Glucose-Capillary 138 (H) 65 - 99 mg/dL  Glucose, capillary     Status: Abnormal   Collection Time: 08/14/15  5:20 PM  Result Value Ref Range   Glucose-Capillary 173 (H) 65 - 99 mg/dL   Comment 1 Notify RN    Comment 2 Document in Chart   Troponin I (q 6hr x 3)     Status: Abnormal   Collection Time: 08/14/15  6:27 PM  Result Value Ref Range   Troponin I 0.13 (H) <0.031 ng/mL    Comment:        PERSISTENTLY INCREASED TROPONIN VALUES IN THE RANGE OF 0.04-0.49 ng/mL CAN BE SEEN IN:       -UNSTABLE  ANGINA       -CONGESTIVE HEART FAILURE       -MYOCARDITIS       -CHEST TRAUMA       -ARRYHTHMIAS       -LATE PRESENTING MYOCARDIAL INFARCTION       -COPD   CLINICAL FOLLOW-UP RECOMMENDED.   TSH     Status: None   Collection Time: 08/14/15  6:27 PM  Result Value Ref Range   TSH 1.917 0.350 - 4.500 uIU/mL  Troponin I (q 6hr x 3)     Status: Abnormal   Collection Time: 08/14/15  9:20 PM  Result Value Ref Range   Troponin I 0.15 (H) <0.031 ng/mL    Comment:        PERSISTENTLY INCREASED TROPONIN VALUES IN THE RANGE OF 0.04-0.49 ng/mL CAN BE SEEN IN:       -UNSTABLE ANGINA       -CONGESTIVE HEART FAILURE       -MYOCARDITIS       -CHEST TRAUMA       -ARRYHTHMIAS       -LATE PRESENTING MYOCARDIAL INFARCTION       -COPD   CLINICAL FOLLOW-UP RECOMMENDED.   Glucose, capillary     Status: Abnormal   Collection Time: 08/14/15  9:20 PM  Result Value Ref Range   Glucose-Capillary 338 (H) 65 - 99 mg/dL  Urinalysis, Routine w reflex microscopic (not at [facility])     Status: Abnormal   Collection Time: 08/14/15  9:25 PM  Result Value Ref Range   Color, Urine AMBER (  A) YELLOW    Comment: BIOCHEMICALS MAY BE AFFECTED BY COLOR   APPearance HAZY (A) CLEAR   Specific Gravity, Urine 1.015 1.005 - 1.030   pH 5.0 5.0 - 8.0   Glucose, UA 100 (A) NEGATIVE mg/dL   Hgb urine dipstick LARGE (A) NEGATIVE   Bilirubin Urine NEGATIVE NEGATIVE   Ketones, ur NEGATIVE NEGATIVE mg/dL   Protein, ur 100 (A) NEGATIVE mg/dL   Nitrite NEGATIVE NEGATIVE   Leukocytes, UA MODERATE (A) NEGATIVE  MRSA PCR Screening     Status: None   Collection Time: 08/14/15  9:25 PM  Result Value Ref Range   MRSA by PCR NEGATIVE NEGATIVE    Comment:        The GeneXpert MRSA Assay (FDA approved for NASAL specimens only), is one component of a comprehensive MRSA colonization surveillance program. It is not intended to diagnose MRSA infection nor to guide or monitor treatment for MRSA infections.   Urine microscopic-add  on     Status: Abnormal   Collection Time: 08/14/15  9:25 PM  Result Value Ref Range   Squamous Epithelial / LPF 0-5 (A) NONE SEEN   WBC, UA 0-5 0 - 5 WBC/hpf   RBC / HPF TOO NUMEROUS TO COUNT 0 - 5 RBC/hpf   Bacteria, UA MANY (A) NONE SEEN  Heparin level (unfractionated)     Status: None   Collection Time: 08/14/15 11:01 PM  Result Value Ref Range   Heparin Unfractionated 0.36 0.30 - 0.70 IU/mL    Comment:        IF HEPARIN RESULTS ARE BELOW EXPECTED VALUES, AND PATIENT DOSAGE HAS BEEN CONFIRMED, SUGGEST FOLLOW UP TESTING OF ANTITHROMBIN III LEVELS.   Troponin I (q 6hr x 3)     Status: Abnormal   Collection Time: 08/14/15 11:01 PM  Result Value Ref Range   Troponin I 0.14 (H) <0.031 ng/mL    Comment:        PERSISTENTLY INCREASED TROPONIN VALUES IN THE RANGE OF 0.04-0.49 ng/mL CAN BE SEEN IN:       -UNSTABLE ANGINA       -CONGESTIVE HEART FAILURE       -MYOCARDITIS       -CHEST TRAUMA       -ARRYHTHMIAS       -LATE PRESENTING MYOCARDIAL INFARCTION       -COPD   CLINICAL FOLLOW-UP RECOMMENDED.   Basic metabolic panel     Status: Abnormal   Collection Time: 08/14/15 11:01 PM  Result Value Ref Range   Sodium 136 135 - 145 mmol/L   Potassium 4.0 3.5 - 5.1 mmol/L    Comment: DELTA CHECK NOTED   Chloride 99 (L) 101 - 111 mmol/L   CO2 21 (L) 22 - 32 mmol/L   Glucose, Bld 263 (H) 65 - 99 mg/dL   BUN 37 (H) 6 - 20 mg/dL   Creatinine, Ser 2.41 (H) 0.61 - 1.24 mg/dL   Calcium 9.6 8.9 - 10.3 mg/dL   GFR calc non Af Amer 27 (L) >60 mL/min   GFR calc Af Amer 31 (L) >60 mL/min    Comment: (NOTE) The eGFR has been calculated using the CKD EPI equation. This calculation has not been validated in all clinical situations. eGFR's persistently <60 mL/min signify possible Chronic Kidney Disease.    Anion gap 16 (H) 5 - 15  Magnesium     Status: None   Collection Time: 08/14/15 11:01 PM  Result Value Ref Range   Magnesium 1.8 1.7 -  2.4 mg/dL  Glucose, capillary     Status:  Abnormal   Collection Time: 08/14/15 11:13 PM  Result Value Ref Range   Glucose-Capillary 276 (H) 65 - 99 mg/dL  Culture, blood (routine x 2)     Status: None (Preliminary result)   Collection Time: 08/14/15 11:30 PM  Result Value Ref Range   Specimen Description BLOOD RIGHT HAND    Special Requests BOTTLES DRAWN AEROBIC AND ANAEROBIC 4CC EACH    Culture  Setup Time      GRAM NEGATIVE RODS RECOVERED FROM THE AEROBIC BOTTLE Gram Stain Report Called to,Read Back By and Verified With: [place]. AT [id number] ON 08/15/2015 BY BAUGHAM,M. Performed at [place] ID to follow CRITICAL RESULT CALLED TO, READ BACK BY AND VERIFIED WITH: A MEYER PHARMD 1935 08/15/15 A BROWNING Performed at [facility]    Culture PENDING    Report Status PENDING   Blood Culture ID Panel (Reflexed)     Status: Abnormal   Collection Time: 08/14/15 11:30 PM  Result Value Ref Range   Enterococcus species NOT DETECTED NOT DETECTED   Vancomycin resistance NOT DETECTED NOT DETECTED   Listeria monocytogenes NOT DETECTED NOT DETECTED   Staphylococcus species NOT DETECTED NOT DETECTED   Staphylococcus aureus NOT DETECTED NOT DETECTED   Methicillin resistance NOT DETECTED NOT DETECTED   Streptococcus species NOT DETECTED NOT DETECTED   Streptococcus agalactiae NOT DETECTED NOT DETECTED   Streptococcus pneumoniae NOT DETECTED NOT DETECTED   Streptococcus pyogenes NOT DETECTED NOT DETECTED   Acinetobacter baumannii NOT DETECTED NOT DETECTED   Enterobacteriaceae species DETECTED (A) NOT DETECTED    Comment: CRITICAL RESULT CALLED TO, READ BACK BY AND VERIFIED WITH: A MEYER PHARMD 1935 08/15/15 A BROWNING    Enterobacter cloacae complex NOT DETECTED NOT DETECTED   Escherichia coli NOT DETECTED NOT DETECTED   Klebsiella oxytoca NOT DETECTED NOT DETECTED   Klebsiella pneumoniae NOT DETECTED NOT DETECTED   Proteus species NOT DETECTED NOT DETECTED   Serratia marcescens NOT DETECTED NOT DETECTED    Carbapenem resistance NOT DETECTED NOT DETECTED   Haemophilus influenzae NOT DETECTED NOT DETECTED   Neisseria meningitidis NOT DETECTED NOT DETECTED   Pseudomonas aeruginosa NOT DETECTED NOT DETECTED   Candida albicans NOT DETECTED NOT DETECTED   Candida glabrata NOT DETECTED NOT DETECTED   Candida krusei NOT DETECTED NOT DETECTED   Candida parapsilosis NOT DETECTED NOT DETECTED   Candida tropicalis NOT DETECTED NOT DETECTED    Comment: Performed at [facility]  Lactic acid, plasma     Status: Abnormal   Collection Time: 08/14/15 11:40 PM  Result Value Ref Range   Lactic Acid, Venous 5.1 (HH) 0.5 - 2.0 mmol/L    Comment: CRITICAL RESULT CALLED TO, READ BACK BY AND VERIFIED WITH: WAGONER R AT 0010 ON [id number] BY FORSYTH K   Procalcitonin     Status: None   Collection Time: 08/14/15 11:40 PM  Result Value Ref Range   Procalcitonin 1.42 ng/mL    Comment:        Interpretation: PCT > 0.5 ng/mL and <= 2 ng/mL: Systemic infection (sepsis) is possible, but other conditions are known to elevate PCT as well. (NOTE)         ICU PCT Algorithm               Non ICU PCT Algorithm    ----------------------------     ------------------------------         PCT < 0.25 ng/mL  PCT < 0.1 ng/mL     Stopping of antibiotics            Stopping of antibiotics       strongly encouraged.               strongly encouraged.    ----------------------------     ------------------------------       PCT level decrease by               PCT < 0.25 ng/mL       >= 80% from peak PCT       OR PCT 0.25 - 0.5 ng/mL          Stopping of antibiotics                                             encouraged.     Stopping of antibiotics           encouraged.    ----------------------------     ------------------------------       PCT level decrease by              PCT >= 0.25 ng/mL       < 80% from peak PCT        AND PCT >= 0.5 ng/mL             Continuing antibiotics                                               encouraged.       Continuing antibiotics            encouraged.    ----------------------------     ------------------------------     PCT level increase compared          PCT > 0.5 ng/mL         with peak PCT AND          PCT >= 0.5 ng/mL             Escalation of antibiotics                                          strongly encouraged.      Escalation of antibiotics        strongly encouraged.   Culture, blood (routine x 2)     Status: None (Preliminary result)   Collection Time: 08/14/15 11:45 PM  Result Value Ref Range   Specimen Description BLOOD LEFT HAND    Special Requests BOTTLES DRAWN AEROBIC AND ANAEROBIC 3CC EACH    Culture NO GROWTH < 12 HOURS    Report Status PENDING   Lactic acid, plasma     Status: Abnormal   Collection Time: 08/15/15  2:02 AM  Result Value Ref Range   Lactic Acid, Venous 4.7 (HH) 0.5 - 2.0 mmol/L    Comment: CRITICAL RESULT CALLED TO, READ BACK BY AND VERIFIED WITH: WAGONER R AT 0253 ON [id number] BY FORSYTH K   Comprehensive metabolic panel     Status: Abnormal   Collection Time: 08/15/15  4:22 AM  Result Value Ref Range   Sodium  136 135 - 145 mmol/L   Potassium 3.4 (L) 3.5 - 5.1 mmol/L   Chloride 101 101 - 111 mmol/L   CO2 20 (L) 22 - 32 mmol/L   Glucose, Bld 179 (H) 65 - 99 mg/dL   BUN 44 (H) 6 - 20 mg/dL   Creatinine, Ser 3.35 (H) 0.61 - 1.24 mg/dL   Calcium 9.0 8.9 - 10.3 mg/dL   Total Protein 5.9 (L) 6.5 - 8.1 g/dL   Albumin 3.4 (L) 3.5 - 5.0 g/dL   AST 44 (H) 15 - 41 U/L   ALT 24 17 - 63 U/L   Alkaline Phosphatase 64 38 - 126 U/L   Total Bilirubin 0.7 0.3 - 1.2 mg/dL   GFR calc non Af Amer 18 (L) >60 mL/min   GFR calc Af Amer 21 (L) >60 mL/min    Comment: (NOTE) The eGFR has been calculated using the CKD EPI equation. This calculation has not been validated in all clinical situations. eGFR's persistently <60 mL/min signify possible Chronic Kidney Disease.    Anion gap 15 5 - 15  Heparin level (unfractionated)      Status: None   Collection Time: 08/15/15  4:22 AM  Result Value Ref Range   Heparin Unfractionated 0.38 0.30 - 0.70 IU/mL    Comment:        IF HEPARIN RESULTS ARE BELOW EXPECTED VALUES, AND PATIENT DOSAGE HAS BEEN CONFIRMED, SUGGEST FOLLOW UP TESTING OF ANTITHROMBIN III LEVELS.   Lipid panel     Status: Abnormal   Collection Time: 08/15/15  4:22 AM  Result Value Ref Range   Cholesterol 79 0 - 200 mg/dL   Triglycerides 38 <150 mg/dL   HDL 34 (L) >40 mg/dL   Total CHOL/HDL Ratio 2.3 RATIO   VLDL 8 0 - 40 mg/dL   LDL Cholesterol 37 0 - 99 mg/dL    Comment:        Total Cholesterol/HDL:CHD Risk Coronary Heart Disease Risk Table                     Men   Women  1/2 Average Risk   3.4   3.3  Average Risk       5.0   4.4  2 X Average Risk   9.6   7.1  3 X Average Risk  23.4   11.0        Use the calculated Patient Ratio above and the CHD Risk Table to determine the patient's CHD Risk.        ATP III CLASSIFICATION (LDL):  <100     mg/dL   Optimal  100-129  mg/dL   Near or Above                    Optimal  130-159  mg/dL   Borderline  160-189  mg/dL   High  >190     mg/dL   Very High   Glucose, capillary     Status: Abnormal   Collection Time: 08/15/15  7:33 AM  Result Value Ref Range   Glucose-Capillary 138 (H) 65 - 99 mg/dL   Comment 1 Notify RN    Comment 2 Document in Chart   Blood gas, arterial     Status: Abnormal   Collection Time: 08/15/15  8:50 AM  Result Value Ref Range   O2 Content 4.0 L/min   Delivery systems NASAL CANNULA    pH, Arterial 7.425 7.350 - 7.450   pCO2 arterial  29.8 (L) 35.0 - 45.0 mmHg   pO2, Arterial 61.6 (L) 80.0 - 100.0 mmHg   Bicarbonate 21.1 20.0 - 24.0 mEq/L   Acid-base deficit 4.4 (H) 0.0 - 2.0 mmol/L   O2 Saturation 91.1 %   Patient temperature 37.0    Collection site LEFT RADIAL    Drawn by [id number]    Sample type ARTERIAL DRAW    Allens test (pass/fail) PASS PASS  Glucose, capillary     Status: Abnormal   Collection Time:  08/15/15 11:17 AM  Result Value Ref Range   Glucose-Capillary 145 (H) 65 - 99 mg/dL   Comment 1 Notify RN    Comment 2 Document in Chart   Glucose, capillary     Status: Abnormal   Collection Time: 08/15/15  2:56 PM  Result Value Ref Range   Glucose-Capillary 264 (H) 65 - 99 mg/dL   Comment 1 Notify RN   CBC with Differential/Platelet     Status: Abnormal   Collection Time: 08/15/15  4:30 PM  Result Value Ref Range   WBC 17.7 (H) 4.0 - 10.5 K/uL   RBC 3.99 (L) 4.22 - 5.81 MIL/uL   Hemoglobin 11.7 (L) 13.0 - 17.0 g/dL   HCT 36.1 (L) 39.0 - 52.0 %   MCV 90.5 78.0 - 100.0 fL   MCH 29.3 26.0 - 34.0 pg   MCHC 32.4 30.0 - 36.0 g/dL   RDW 15.3 11.5 - 15.5 %   Platelets 201 150 - 400 K/uL   Neutrophils Relative % 87 %   Lymphocytes Relative 9 %   Monocytes Relative 4 %   Eosinophils Relative 0 %   Basophils Relative 0 %   Neutro Abs 15.4 (H) 1.7 - 7.7 K/uL   Lymphs Abs 1.6 0.7 - 4.0 K/uL   Monocytes Absolute 0.7 0.1 - 1.0 K/uL   Eosinophils Absolute 0.0 0.0 - 0.7 K/uL   Basophils Absolute 0.0 0.0 - 0.1 K/uL   RBC Morphology POLYCHROMASIA PRESENT     Comment: RARE NRBCs   WBC Morphology MILD LEFT SHIFT (1-5% METAS, OCC MYELO, OCC BANDS)   Basic metabolic panel     Status: Abnormal   Collection Time: 08/15/15  4:30 PM  Result Value Ref Range   Sodium 131 (L) 135 - 145 mmol/L   Potassium 5.3 (H) 3.5 - 5.1 mmol/L   Chloride 99 (L) 101 - 111 mmol/L   CO2 15 (L) 22 - 32 mmol/L   Glucose, Bld 306 (H) 65 - 99 mg/dL   BUN 49 (H) 6 - 20 mg/dL   Creatinine, Ser 4.60 (H) 0.61 - 1.24 mg/dL   Calcium 7.9 (L) 8.9 - 10.3 mg/dL   GFR calc non Af Amer 12 (L) >60 mL/min   GFR calc Af Amer 14 (L) >60 mL/min    Comment: (NOTE) The eGFR has been calculated using the CKD EPI equation. This calculation has not been validated in all clinical situations. eGFR's persistently <60 mL/min signify possible Chronic Kidney Disease.    Anion gap 17 (H) 5 - 15  I-STAT 3, arterial blood gas (G3+)      Status: Abnormal   Collection Time: 08/15/15  5:23 PM  Result Value Ref Range   pH, Arterial 7.131 (LL) 7.350 - 7.450   pCO2 arterial 46.5 (H) 35.0 - 45.0 mmHg   pO2, Arterial 78.0 (L) 80.0 - 100.0 mmHg   Bicarbonate 15.5 (L) 20.0 - 24.0 mEq/L   TCO2 17 0 - 100 mmol/L   O2 Saturation 90.0 %  Acid-base deficit 13.0 (H) 0.0 - 2.0 mmol/L   Patient temperature 98.7 F    Collection site ARTERIAL LINE    Drawn by RT    Sample type ARTERIAL    Comment NOTIFIED PHYSICIAN   Blood gas, arterial     Status: Abnormal   Collection Time: 08/15/15  6:15 PM  Result Value Ref Range   pH, Arterial 7.205 (L) 7.350 - 7.450   pCO2 arterial 44.9 35.0 - 45.0 mmHg   pO2, Arterial 44.0 (L) 80.0 - 100.0 mmHg   Bicarbonate 17.1 (L) 20.0 - 24.0 mEq/L   TCO2 18.5 0 - 100 mmol/L   Acid-base deficit 9.4 (H) 0.0 - 2.0 mmol/L   O2 Saturation 68.9 %   Patient temperature 98.6   I-STAT 3, arterial blood gas (G3+)     Status: Abnormal   Collection Time: 08/15/15  8:18 PM  Result Value Ref Range   pH, Arterial 7.291 (L) 7.350 - 7.450   pCO2 arterial 28.6 (L) 35.0 - 45.0 mmHg   pO2, Arterial 122.0 (H) 80.0 - 100.0 mmHg   Bicarbonate 13.8 (L) 20.0 - 24.0 mEq/L   TCO2 15 0 - 100 mmol/L   O2 Saturation 98.0 %   Acid-base deficit 11.0 (H) 0.0 - 2.0 mmol/L   Patient temperature 98.4 F    Collection site ARTERIAL LINE    Drawn by RT    Sample type ARTERIAL    Recent Results (from the past 240 hour(s))  MRSA PCR Screening     Status: None   Collection Time: 08/14/15  9:25 PM  Result Value Ref Range Status   MRSA by PCR NEGATIVE NEGATIVE Final    Comment:        The GeneXpert MRSA Assay (FDA approved for NASAL specimens only), is one component of a comprehensive MRSA colonization surveillance program. It is not intended to diagnose MRSA infection nor to guide or monitor treatment for MRSA infections.   Culture, blood (routine x 2)     Status: None (Preliminary result)   Collection Time: 08/14/15 11:30  PM  Result Value Ref Range Status   Specimen Description BLOOD RIGHT HAND  Final   Special Requests BOTTLES DRAWN AEROBIC AND ANAEROBIC 4CC EACH  Final   Culture  Setup Time   Final    GRAM NEGATIVE RODS RECOVERED FROM THE AEROBIC BOTTLE Gram Stain Report Called to,Read Back By and Verified With: [place]. AT 0300 ON 08/15/2015 BY BAUGHAM,M. Performed at [place] ID to follow CRITICAL RESULT CALLED TO, READ BACK BY AND VERIFIED WITH: A MEYER PHARMD 1935 08/15/15 A BROWNING Performed at [facility]    Culture PENDING  Incomplete   Report Status PENDING  Incomplete  Blood Culture ID Panel (Reflexed)     Status: Abnormal   Collection Time: 08/14/15 11:30 PM  Result Value Ref Range Status   Enterococcus species NOT DETECTED NOT DETECTED Final   Vancomycin resistance NOT DETECTED NOT DETECTED Final   Listeria monocytogenes NOT DETECTED NOT DETECTED Final   Staphylococcus species NOT DETECTED NOT DETECTED Final   Staphylococcus aureus NOT DETECTED NOT DETECTED Final   Methicillin resistance NOT DETECTED NOT DETECTED Final   Streptococcus species NOT DETECTED NOT DETECTED Final   Streptococcus agalactiae NOT DETECTED NOT DETECTED Final   Streptococcus pneumoniae NOT DETECTED NOT DETECTED Final   Streptococcus pyogenes NOT DETECTED NOT DETECTED Final   Acinetobacter baumannii NOT DETECTED NOT DETECTED Final   Enterobacteriaceae species DETECTED (A) NOT DETECTED Final  Comment: CRITICAL RESULT CALLED TO, READ BACK BY AND VERIFIED WITH: A MEYER PHARMD 1935 08/15/15 A BROWNING    Enterobacter cloacae complex NOT DETECTED NOT DETECTED Final   Escherichia coli NOT DETECTED NOT DETECTED Final   Klebsiella oxytoca NOT DETECTED NOT DETECTED Final   Klebsiella pneumoniae NOT DETECTED NOT DETECTED Final   Proteus species NOT DETECTED NOT DETECTED Final   Serratia marcescens NOT DETECTED NOT DETECTED Final   Carbapenem resistance NOT DETECTED NOT DETECTED Final    Haemophilus influenzae NOT DETECTED NOT DETECTED Final   Neisseria meningitidis NOT DETECTED NOT DETECTED Final   Pseudomonas aeruginosa NOT DETECTED NOT DETECTED Final   Candida albicans NOT DETECTED NOT DETECTED Final   Candida glabrata NOT DETECTED NOT DETECTED Final   Candida krusei NOT DETECTED NOT DETECTED Final   Candida parapsilosis NOT DETECTED NOT DETECTED Final   Candida tropicalis NOT DETECTED NOT DETECTED Final    Comment: Performed at [facility]  Culture, blood (routine x 2)     Status: None (Preliminary result)   Collection Time: 08/14/15 11:45 PM  Result Value Ref Range Status   Specimen Description BLOOD LEFT HAND  Final   Special Requests BOTTLES DRAWN AEROBIC AND ANAEROBIC 3CC EACH  Final   Culture NO GROWTH < 12 HOURS  Final   Report Status PENDING  Incomplete   Creatinine:  Recent Labs  08/14/15 1444 08/14/15 1501 08/14/15 2301 08/15/15 0422 08/15/15 1630  CREATININE 2.40* 2.33* 2.41* 3.35* 4.60*   Baseline Creatinine: 1.46 on 12/05/2013  Imaging: Renal Ultrasound, 6/13: IMPRESSION: Mild right renal atrophy is noted with increased echogenicity of parenchyma of right kidney suggesting medical renal disease. No hydronephrosis or renal obstruction is noted. Left kidney appears Normal.  Procedure: See separate procedure note for details of bedside cystourethroscopy and placement of 70F council tip catheter over wire  Impression/Assessment & Plan:   1. Urethral false passages - Multiple unsuccessful attempts at foley catheter placement at [place] resulting in significant urethral/prostatic bleeding and 3-4 significant urethral false passages and near complete obliteration of the true urethral lumen. Cystourethroscopy performed at bedside with very difficult but successful placement of 70F council tip catheter over wire. Plan to keep foley catheter in for at least 2 weeks given significant urethral trauma and multiple false passages.  We will schedule follow up for him to be seen at [facility] for a trial of void in at least 2 weeks.     2. Acute on chronic kidney disease - Multi-factorial acute on chronic renal disease likely contributed to by urinary retention possibly secondary to multiple urethral false passages, sepsis, pressors and nephrotoxic medications. Renal ultrasound with some evidence of chronic right medical renal disease. Recommend complete decompression of collecting system with foley catheter given urethral strictures and possible retention. Recommend close monitoring of UOP and creatinine.  3. Possible sepsis of urinary origin - possible urinary source of sepsis with +blood cultures. Somewhat less likely given re-assuring UA and urine cultures, however, timing of initiation of antibiotics and urine culture are not altogether clear. Given this recommend continued empiric treatment of sepsis of possible urinary origin and keep foley catheter to drainage for decompression of the collecting system.   Acie Fredrickson 08/15/2015, 9:32 PM    I have seen and examined the pateint, discussed with Dr. Frederico Hamman, and agree with above.  Briefly.  S: Septic shock of pulm (aspiration) v. GU origin.   O: GCS3t on vent Obese abdomen  No overt  CVAT  Blood at urethral meatus DRE 40gm smooth prostate (not sig enlarged) Rt groin line in place SCD's in place  Renal US - no hydro  A/P 1 - bedside cysto / foley placemetn 2 - likely acute on chronic medical renal disease 3 - possible UTI / pyelo, agree with current empriric ABX. No hydro / stones complicating.

## 2015-08-15 NOTE — H&P (Signed)
PULMONARY / CRITICAL CARE MEDICINE   Name: Frank Hoover MRN: [id number] DOB: Oct 06, 1950    ADMISSION DATE:  08/14/2015 REFERRING MD: Dr. Legrand Rams  CHIEF COMPLAINT: Tachycardia   HISTORY OF PRESENT ILLNESS:  65 year old male with PMH of HTN, COPD, former smoker, DM type 2, GERD, HLD, OA, CKD stage 3, diverticulosis, and gout admitted at [facility] on 6/12 with new onset tachycardia found to be in atrial flutter with rapid ventricular response.   Patient was referred by his nephrologist, Dr. Lowanda Foster, for an echocardiogram for progressive leg swelling over the last few months. He has not been very active at home and reports dyspnea with activity. He was in the cardiovascular lab on 6/12 for his ECHO that showed evidence of a cardiomyopathy with LVEF at 25%. He became tachycardic and dizzy and therefore sent to [facility] ED for further evaluation. In the ED, EKG showed atrial flutter with 2:1 block and nonspecific ST segment changes. Troponin I was 0.13, sCR 2.33 (baseline 1.4-1.63), potassium 2.9, BUN 35, glucose 52, normal CBC, and CXR with no active abnormalities. Cardology was consulted. He was treated with Cardizem, IV heparin, lopressor, and IV lasix for diuresis. He has no prior history of cardiac arrhythmia or cardiomyopathy.   He was placed on CPAP (? Presumed SOB) however vomited in it on 6/12 that evening. Soon after, his heart rate increased to 140's, SBP >200, and fever of 104 axillary. He had chills and was shaking, but denied at that time any shortness of breath or pain. At that time, sepsis protocol was initiated. Started on vancomycin and Zosyn empirically. No NS bolus was given secondary to overload in volume status. Lab showed BNP 609, and trend in troponin 0.13/0.15/0.14. Lactic acid trend 2.75/5.1/4.7 and procalcitonin 1.42. Urine obtained showed many bacteria, moderate leukocytes, negative nitrates, 0-5 WBC, and 0-5 squamous epithelial cells. A foley catheter was attempted  but unsuccessful with subsequent penile bleeding afterwards; thus, a condom catheter was placed instead. The patient developed hypotension am of 6/13 and was transferred to [facility] for further evaluation.    Preliminary blood cultures demonstrates GNR's, urine culture pending.  6/13 labs - Na 136, K 3.4, CO2 20, BUN 44, Cr 3.35, glucose 179, AST 44 / ALT 24.  Initial CXR negative for acute infiltrate.   PAST MEDICAL HISTORY :  He  has a past medical history of Gout; COPD (chronic obstructive pulmonary disease) ([place]); Type 2 diabetes mellitus ([place]); GERD (gastroesophageal reflux disease); Essential hypertension; Hyperlipemia; OA (osteoarthritis); Gastroparesis; Adenomatous colon polyp (10/30/09); Diverticulosis of colon; and CKD (chronic kidney disease) stage 3, GFR 30-59 ml/min.  PAST SURGICAL HISTORY: He  has past surgical history that includes Tonsillectomy; Cataract extraction (Right); Esophagogastroduodenoscopy (10/30/09); Colonoscopy (10/30/09); Colonoscopy (N/A, 09/18/2012); Colonoscopy (N/A, 04/08/2013); and Colonoscopy (N/A, 06/22/2014).  No Known Allergies  No current facility-administered medications on file prior to encounter.   Current Outpatient Prescriptions on File Prior to Encounter  Medication Sig  . acetaminophen (TYLENOL) 500 MG tablet Take 1 tablet (500 mg total) by mouth every 6 (six) hours as needed for moderate pain.  Marland Kitchen albuterol (PROAIR HFA) 108 (90 BASE) MCG/ACT inhaler Inhale 2 puffs into the lungs every 6 (six) hours as needed for wheezing or shortness of breath.   . allopurinol (ZYLOPRIM) 300 MG tablet Take 300 mg by mouth daily.    Marland Kitchen amLODipine (NORVASC) 10 MG tablet Take 10 mg by mouth daily.  . Cholecalciferol (VITAMIN D PO) Take 1,000 Units by mouth daily.  . cloNIDine (CATAPRES)  0.2 MG tablet Take 0.2 mg by mouth 3 (three) times daily.   . furosemide (LASIX) 40 MG tablet Take 1 tablet (40 mg total) by mouth daily. (Patient taking differently: Take 40-80 mg by mouth 2  (two) times daily. 2 tablets in the morning and 1 tablet in the evening.)  . gabapentin (NEURONTIN) 400 MG capsule Take 400 mg by mouth 2 (two) times daily. Takes at noon & bedtime  . glimepiride (AMARYL) 2 MG tablet Take 2 mg by mouth daily before breakfast.    . HUMALOG KWIKPEN 100 UNIT/ML KiwkPen Inject 1-54 Units into the skin 3 (three) times daily before meals. Per sliding scale  . hydrALAZINE (APRESOLINE) 50 MG tablet Take 1 tablet (50 mg total) by mouth 2 (two) times daily.  Marland Kitchen LINZESS 290 MCG CAPS capsule TAKE 1 CAPSULE ONCE DAILY 30 MINUTES BEFORE MEALS  . metoprolol (LOPRESSOR) 100 MG tablet Take 100 mg by mouth 2 (two) times daily.   Marland Kitchen omeprazole (PRILOSEC) 20 MG capsule Take 20 mg by mouth daily.  Nelva Nay SOLOSTAR 300 UNIT/ML SOPN Inject 15 Units into the skin at bedtime.  Marland Kitchen zolpidem (AMBIEN) 10 MG tablet Take 10 mg by mouth at bedtime.   Marland Kitchen HYDROcodone-acetaminophen (NORCO/VICODIN) 5-325 MG per tablet Take 1 tablet by mouth every 6 (six) hours as needed for moderate pain.   Marland Kitchen lanolin ointment Apply 1 application topically 2 (two) times daily as needed for dry skin.   FAMILY HISTORY:  His indicated that his mother is deceased. He indicated that his father is deceased. He indicated that his brother is deceased.   SOCIAL HISTORY: He  reports that he quit smoking about 4 years ago. His smoking use included Cigarettes. He has a 4 pack-year smoking history. He does not have any smokeless tobacco history on file. He reports that he does not drink alcohol or use illicit drugs.  REVIEW OF SYSTEMS:  Unable to complete with patient due to altered mental status, emergent intubation   SUBJECTIVE:  RN reports aflutter in 50's/60's, remains on levophed, vasopressin & heparin gtt's  Cardizem discontinued.  VITAL SIGNS: BP 100/70 mmHg  Pulse 64  Temp(Src) 100.9 F (38.3 C) (Axillary)  Resp 25  Ht 5' 6"  (1.676 m)  Wt 260 lb 12.9 oz (118.3 kg)  BMI 42.11 kg/m2  SpO2 93%  HEMODYNAMICS:     VENTILATOR SETTINGS:    INTAKE / OUTPUT: I/O last 3 completed shifts: In: 662.9 [P.O.:540; I.V.:122.9] Out: 500 [Urine:500]  PHYSICAL EXAMINATION: General:  Chronically ill appearing male, appears uncomfortable, pale / diaphoretic  Neuro:  Arouses to name then drifts back to sleep, MAE  HEENT:  MM pale/moist, no appreciable JVD Cardiovascular:  s1s2 rrr, no m/r/g  Lungs:  Shallow, abdominal accessory muscle use noted, lungs bilaterally clear Abdomen:  Obese/soft, bsx4 active  Musculoskeletal:  No acute deformities  Skin:  Warm/dry, LE edema +1-2  LABS:  BMET  Recent Labs Lab 08/14/15 1501 08/14/15 2301 08/15/15 0422  NA 138 136 136  K 2.9* 4.0 3.4*  CL 100* 99* 101  CO2 29 21* 20*  BUN 35* 37* 44*  CREATININE 2.33* 2.41* 3.35*  GLUCOSE 52* 263* 179*   Electrolytes  Recent Labs Lab 08/14/15 1501 08/14/15 2301 08/15/15 0422  CALCIUM 10.1 9.6 9.0  MG 2.1 1.8  --    CBC  Recent Labs Lab 08/14/15 1444 08/14/15 1501  WBC  --  9.2  HGB 14.3 13.8  HCT 42.0 40.2  PLT  --  367  Coag's  Recent Labs Lab 08/14/15 1601  INR 1.12   Sepsis Markers  Recent Labs Lab 08/14/15 2340 08/15/15 0202  LATICACIDVEN 5.1* 4.7*  PROCALCITON 1.42  --    ABG  Recent Labs Lab 08/15/15 0850  PHART 7.425  PCO2ART 29.8*  PO2ART 61.6*   Liver Enzymes  Recent Labs Lab 08/15/15 0422  AST 44*  ALT 24  ALKPHOS 64  BILITOT 0.7  ALBUMIN 3.4*   Cardiac Enzymes  Recent Labs Lab 08/14/15 1827 08/14/15 2120 08/14/15 2301  TROPONINI 0.13* 0.15* 0.14*   Glucose  Recent Labs Lab 08/14/15 1650 08/14/15 1720 08/14/15 2120 08/14/15 2313 08/15/15 0733 08/15/15 1117  GLUCAP 138* 173* 338* 276* 138* 145*   Imaging Dg Chest 2 View  08/14/2015  CLINICAL DATA:  65 year old male with shortness of breath. EXAM: CHEST  2 VIEW COMPARISON:  Chest radiograph dated 12/02/2013 FINDINGS: The heart size and mediastinal contours are within normal limits. Both lungs  are clear. The visualized skeletal structures are unremarkable. IMPRESSION: No active cardiopulmonary disease. Electronically Signed   By: Anner Crete M.D.   On: 08/14/2015 15:52   US Renal  08/15/2015  CLINICAL DATA:  Acute kidney injury. EXAM: RENAL / URINARY TRACT ULTRASOUND COMPLETE COMPARISON:  Ultrasound of June 18, 2004. FINDINGS: Right Kidney: Length: 8.5 cm. Increased echogenicity of renal parenchyma is noted. No mass or hydronephrosis visualized. Left Kidney: Length: 10 cm. Echogenicity within normal limits. No mass or hydronephrosis visualized. Bladder: Appears normal for degree of bladder distention. IMPRESSION: Mild right renal atrophy is noted with increased echogenicity of parenchyma of right kidney suggesting medical renal disease. No hydronephrosis or renal obstruction is noted. Left kidney appears normal. Electronically Signed   By: Marijo Conception, M.D.   On: 08/15/2015 12:46   Dg Chest Port 1 View  08/14/2015  CLINICAL DATA:  Shortness of breath. Altered mental status. Bilateral leg edema. EXAM: PORTABLE CHEST 1 VIEW COMPARISON:  08/14/2015 FINDINGS: Examination is technically limited due to motion artifact. Shallow inspiration. Mild cardiac enlargement without vascular congestion. No focal airspace disease or consolidation in the lungs. No blunting of costophrenic angles. No pneumothorax. IMPRESSION: Shallow inspiration.  No evidence of active pulmonary disease. Electronically Signed   By: Lucienne Capers M.D.   On: 08/14/2015 23:42   STUDIES:  6/12  ECHO >> LV nml size, mild LVH, EF 25%, diffuse hypokinesis, akinesis of inferoseptal myocardium, mild MR, LA mildly dilated, RV systolic fxn mildly reduced, patent foramen ovale with bidirectional shunting, mild TR, PA peak 31, no effusion 6/13  Renal US >> mild R atrophy with increased echogenicity of parenchyma suggestive of medical renal disease, no hydronephrosis or obstructing stone, left kidney appears normal   CULTURES: UA  6/12 >> many bacteria, mod leuks, nitrite neg, protein 100, RBC TNTC, 0-5 squamous epithelial cells  BCx2 6/12 >> GNR 1/2 >>   ANTIBIOTICS: Vanco 6/12 >>  Zosyn 6/12 >>   SIGNIFICANT EVENTS: 6/12   Admit to [facility] for tachycardia, concern for UTI/sepsis, AFlutter 6/13  Transfer to [facility] for evaluation of sepsis  LINES/TUBES: R Fem TLC 6/13>>> ETT 6/13>>>  DISCUSSION: 65 y/o M with admitted 6/12 to [facility] for atrial flutter, concern for UTI / sepsis.  Decompensated 6/13 requiring vasopressors.  Transferred to [facility] for further evaluation.   ASSESSMENT / PLAN:  PULMONARY A: Possible Aspiration Event - vomiting while on bipap 6/12 At Risk Aspiration / Need for Intubation - in setting of AMS secondary to sepsis  Hx COPD - on  albuterol only PRN at home  P:   Now intubation with resp distress / cardiac abnormalities  PRVC 8cc/kg Follow up CXR post intubation ABG one hour post intubation PRN duonebs for wheezing   CARDIOVASCULAR A:  Septic Shock - GNR in 1/2 blood cultures, +/- component of hypovolemic, ECHO CVP 8 on 6/12 prior to lasix Cardiomyopathy / sCHF- newly diagnosed, EF ~ 25% Atrial Flutter with RVR Elevated Troponin - likely demand ischemia Hx HTN, HLD P:  ICU monitoring  Levophed, vasopressin for MAP >65 Control underlying source of infection  Assess cortisol  Cardiology following  Trend BNP, troponin  Continue zocor Reduce dose of home lopressor from 100 BID to 25 BID Hold home clonidine  RENAL A:   Acute Kidney Injury - baseline ~ 1.4 in 2015 Hypokalemia  P:   Trend BMP / UOP  Replace electrolytes as indicated    GASTROINTESTINAL A:   Vomiting  Morbid Obesity  P:   NPO for now Pepcid   HEMATOLOGIC A:   DVT prophylaxis  P:  SCD's / Heparin for DVT prophylaxis   INFECTIOUS A:   Septic Shock - concern for UTI, GNR's 1/2 BC UTI  P:   ABX as above  Follow cultures Will need to replace femoral central line  ENDOCRINE A:   Hypoglycemia - on  admit, resolved with eating Hyperglycemia / DM II    Hx Gout P:   SSI with CBG Q4 Hold home amaryl, toujeo Hold home colchicine with AKI  NEUROLOGIC A:   Diabetic Neuropathy  Hx Insomnia  P:   RASS goal: 0 Hold home gabapentin  Hold home ambien  FAMILY  - Updates: Family updated 6/13 at bedside.  Updated on need for urgent intubation to stabilize patient.  Patient has a son that is in an inmate and if he were to decline, the family would like for the patient to be able to see him. Full code for now.  Family has a friend that is a [facility] surgeon that is helping out with his care.    - Inter-disciplinary family meet or Palliative Care meeting due by:  6/20  Noe Gens, NP-C [place] Pgr: [phone] or if no answer [phone] 08/15/2015, 4:00 PM  Attending Note:  65 year old male with extensive PMH who presents to the hospital after being referred for an echo due to leg swelling.  Was noted to have a new non-ischemic cardiomyopathy with an EF of 25%.  Patient was admitted with SOB and was placed on CPAP subsequently vomited and aspirated.  Patient was transferred to [facility] for further management.  Patient is extremely hypotensive at this time.  CVP unknown and new EF of 25%.  Will call cardiology to consult.  Start levo, vaso and epi drips for BP support.  Insert a-line and titrate pressors.  Start vanc and zosyn.  F/U on culture.  The patient is critically ill with multiple organ systems failure and requires high complexity decision making for assessment and support, frequent evaluation and titration of therapies, application of advanced monitoring technologies and extensive interpretation of multiple databases.   Critical Care Time devoted to patient care services described in this note is  35  Minutes. This time reflects time of care of this signee Dr [name]. This critical care time does not reflect procedure time, or teaching time or supervisory time of  PA/NP/Med student/Med Resident etc but could involve care discussion time.  Rush Farmer, M.D. [facility] Pulmonary/Critical  Care Medicine. Pager: [phone]. After hours pager: [phone].

## 2015-08-15 NOTE — Progress Notes (Signed)
Spoke with RN. Pt had copious urethral bleeding due to traumatic foley attempts, heparin for Afib has been turned off. Instructed Rn to leave off heparin until am, check now CBC and am CBC. AM rounding team can reassess in am re: restarting of heparin.

## 2015-08-15 NOTE — Progress Notes (Addendum)
Pharmacy Antibiotic Note  Frank Hoover is a 65 y.o. male with septic shock .  Pharmacy has been consulted to dose vancomycin for PNA (noted on zosyn). Pateint notes with recent fever of 102.8, tmax= 104.1 -SCr= 3.35 (trend up), CrCl ~ 30  Plan: -Vancomycin 2000mg  IV x1 followed by 1500mg  IV q48hr -Will follow renal function, cultures and clinical progress    Height: 5\' 6"  (167.6 cm) Weight: 264 lb 8.8 oz (120 kg) IBW/kg (Calculated) : 63.8  Temp (24hrs), Avg:100.7 F (38.2 C), Min:97.1 F (36.2 C), Max:104.1 F (40.1 C)   Recent Labs Lab 08/14/15 1444 08/14/15 1501 08/14/15 2301 08/14/15 2340 08/15/15 0202 08/15/15 0422  WBC  --  9.2  --   --   --   --   CREATININE 2.40* 2.33* 2.41*  --   --  3.35*  LATICACIDVEN  --   --   --  5.1* 4.7*  --     Estimated Creatinine Clearance: 27.6 mL/min (by C-G formula based on Cr of 3.35).    No Known Allergies  Antimicrobials this admission: 6/13 zosyn 6/13 vancomycin   Microbiology results: 6/12 blood x2- GNR 1/2 6/12 urine 6/12 MRSA PCR- neg  Thank you for allowing pharmacy to be a part of this patient's care.  Hildred Laser, Pharm D 08/15/2015 4:33 PM   Addendum -enterobacter species identified on BCID results -Changing to cefepime  Plan -Cefepime 2gm IV now then 1gm IV q24h  Hildred Laser, Pharm D 08/15/2015 7:58 PM

## 2015-08-15 NOTE — Progress Notes (Signed)
Lactic Acid 5.1 MD notified at same time lab called as MD was present in unit.

## 2015-08-15 NOTE — Procedures (Signed)
Arterial Catheter Insertion Procedure Note Frank Hoover [id number] 1951-02-05  Procedure: Insertion of Arterial Catheter  Indications: Blood pressure monitoring and Frequent blood sampling  Procedure Details Consent: Unable to obtain consent because of emergent medical necessity. Time Out: Verified patient identification, verified procedure, site/side was marked, verified correct patient position, special equipment/implants available, medications/allergies/relevent history reviewed, required imaging and test results available.  Performed  Maximum sterile technique was used including antiseptics, cap, gloves, gown, hand hygiene, mask and sheet. Skin prep: Chlorhexidine; local anesthetic administered 20 gauge catheter was inserted into left radial artery using the Seldinger technique.  Evaluation Blood flow good; BP tracing good. Complications: No apparent complications.   Frank Hoover 08/15/2015

## 2015-08-15 NOTE — Procedures (Signed)
Intubation Procedure Note DIONNE ROSSA [id number] 05-18-1950  Procedure: Intubation Indications: Airway protection and maintenance  Procedure Details Consent: Unable to obtain consent because of emergent medical necessity. Time Out: Verified patient identification, verified procedure, site/side was marked, verified correct patient position, special equipment/implants available, medications/allergies/relevent history reviewed, required imaging and test results available.  Performed  Maximum sterile technique was used including gloves, hand hygiene and mask.  MAC    Evaluation Hemodynamic Status: BP stable throughout; O2 sats: stable throughout Patient's Current Condition: stable Complications: No apparent complications Patient did tolerate procedure well. Chest X-ray ordered to verify placement.  CXR: pending.   [name] 08/15/2015

## 2015-08-15 NOTE — Procedures (Signed)
Foley Catheter Placement Note  Indications:  1. Urethral false passage 2. Acute on chronic renal injury 3. Possible sepsis of urinary origing  Pre-operative Diagnosis:  1. Urethral false passage 2. Acute on chronic renal injury 3. Possible sepsis of urinary origing  Post-operative Diagnosis: Same  Surgeon: Phebe Colla, MD   Resident: E. Harvel Ricks, MD  Assistants: None  Procedure Details  Patient was placed in the supine position, prepped with Betadine and draped in the usual sterile fashion.  We injected lidocaine jelly per urethra prior to the procedure.    We then proceeded with cystourethroscopy. The patient was positioned supine on the table in the relaxed dorsal lithotomy  position and the external genitalia were prepped and draped in the standard fashion.  The cystoscope was inserted per urethra with copious lubrication.  The anterior urethra was normal.  At the level of the bulbar urethra we encountered multiple severe false passages, one large one posteriorly and two smaller ones somewhat anterior to this. There was complete obliteration of the urethral lumen and no pinpoint true passage could be identified by eye. Several unsuccessful attempts were made at passage of a sensor wire anteriorly in an attempt to cannulate the true urethral lumen. Next, we switched to an angled glide wire and eventually were able to blindly cannulate the true urethral lumen staying against the anterior aspect of the urethra and advanced a redundant amount of wire into the bladder. With the advancement of the wire into the bladder there was a rush of yellow urine per urethra which was re-assuring. We then removed the cystoscope and obtained a 18 french council tip catheter and advanced the foley over the wire into the bladder without resistance and return of concentrated appearing clear yellow urine. We inflated the balloon with 10 mL sterile water and removed the wire. The foley was attached to a  drainage bag and secured with a StatLock.  Placement of the catheter had return of greater than 1 L of clear yellow urine. Given the multiple prior attempts at catheterization and the posterior false passage we performed a digital rectal exam which demonstrated a relatively small sized prostate and intact rectum.  Complications: None; patient tolerated the procedure well.  Plan:   1.  Continue Foley catheter to drainage for 14 days due to multiple severe urethral false passages 2.  We will schedule follow up following discharge for a trial of void at [facility]           I have seen and examind the patient and directly assisted with above procedure.

## 2015-08-15 NOTE — Progress Notes (Signed)
Called report to [facility]. Verbalized understanding. Pt to be transferred to [facility].

## 2015-08-16 ENCOUNTER — Inpatient Hospital Stay (HOSPITAL_COMMUNITY): Payer: Medicare Other

## 2015-08-16 DIAGNOSIS — I5023 Acute on chronic systolic (congestive) heart failure: Secondary | ICD-10-CM

## 2015-08-16 LAB — BLOOD GAS, ARTERIAL

## 2015-08-16 LAB — CBC
HCT: 34.9 % — ABNORMAL LOW (ref 39.0–52.0)
Hemoglobin: 11.3 g/dL — ABNORMAL LOW (ref 13.0–17.0)
MCH: 28.5 pg (ref 26.0–34.0)
MCHC: 32.4 g/dL (ref 30.0–36.0)
MCV: 87.9 fL (ref 78.0–100.0)
Platelets: 162 10*3/uL (ref 150–400)
RBC: 3.97 MIL/uL — ABNORMAL LOW (ref 4.22–5.81)
RDW: 15 % (ref 11.5–15.5)
WBC: 10.8 10*3/uL — ABNORMAL HIGH (ref 4.0–10.5)

## 2015-08-16 LAB — GLUCOSE, CAPILLARY
Glucose-Capillary: 108 mg/dL — ABNORMAL HIGH (ref 65–99)
Glucose-Capillary: 116 mg/dL — ABNORMAL HIGH (ref 65–99)
Glucose-Capillary: 119 mg/dL — ABNORMAL HIGH (ref 65–99)
Glucose-Capillary: 124 mg/dL — ABNORMAL HIGH (ref 65–99)
Glucose-Capillary: 127 mg/dL — ABNORMAL HIGH (ref 65–99)
Glucose-Capillary: 133 mg/dL — ABNORMAL HIGH (ref 65–99)
Glucose-Capillary: 144 mg/dL — ABNORMAL HIGH (ref 65–99)
Glucose-Capillary: 146 mg/dL — ABNORMAL HIGH (ref 65–99)
Glucose-Capillary: 158 mg/dL — ABNORMAL HIGH (ref 65–99)
Glucose-Capillary: 181 mg/dL — ABNORMAL HIGH (ref 65–99)
Glucose-Capillary: 185 mg/dL — ABNORMAL HIGH (ref 65–99)
Glucose-Capillary: 193 mg/dL — ABNORMAL HIGH (ref 65–99)
Glucose-Capillary: 214 mg/dL — ABNORMAL HIGH (ref 65–99)
Glucose-Capillary: 255 mg/dL — ABNORMAL HIGH (ref 65–99)
Glucose-Capillary: 274 mg/dL — ABNORMAL HIGH (ref 65–99)
Glucose-Capillary: 316 mg/dL — ABNORMAL HIGH (ref 65–99)
Glucose-Capillary: 318 mg/dL — ABNORMAL HIGH (ref 65–99)
Glucose-Capillary: 347 mg/dL — ABNORMAL HIGH (ref 65–99)
Glucose-Capillary: 349 mg/dL — ABNORMAL HIGH (ref 65–99)
Glucose-Capillary: 373 mg/dL — ABNORMAL HIGH (ref 65–99)

## 2015-08-16 LAB — BASIC METABOLIC PANEL
Anion gap: 17 — ABNORMAL HIGH (ref 5–15)
BUN: 58 mg/dL — ABNORMAL HIGH (ref 6–20)
CO2: 18 mmol/L — ABNORMAL LOW (ref 22–32)
Calcium: 7.2 mg/dL — ABNORMAL LOW (ref 8.9–10.3)
Chloride: 98 mmol/L — ABNORMAL LOW (ref 101–111)
Creatinine, Ser: 4.8 mg/dL — ABNORMAL HIGH (ref 0.61–1.24)
GFR calc Af Amer: 14 mL/min — ABNORMAL LOW (ref >60)
GFR calc non Af Amer: 12 mL/min — ABNORMAL LOW (ref >60)
Glucose, Bld: 350 mg/dL — ABNORMAL HIGH (ref 65–99)
Potassium: 5 mmol/L (ref 3.5–5.1)
Sodium: 133 mmol/L — ABNORMAL LOW (ref 135–145)

## 2015-08-16 LAB — HEPARIN LEVEL (UNFRACTIONATED): Heparin Unfractionated: 0.43 IU/mL (ref 0.30–0.70)

## 2015-08-16 LAB — CARBOXYHEMOGLOBIN
Carboxyhemoglobin: 1.3 % (ref 0.5–1.5)
Methemoglobin: 0.8 % (ref 0.0–1.5)
O2 Saturation: 68.9 %
Total hemoglobin: 12.7 g/dL — ABNORMAL LOW (ref 13.5–18.0)

## 2015-08-16 LAB — HEMOGLOBIN A1C
Hgb A1c MFr Bld: 7.8 % — ABNORMAL HIGH (ref 4.8–5.6)
Mean Plasma Glucose: 177 mg/dL

## 2015-08-16 LAB — TROPONIN I: Troponin I: 0.28 ng/mL — ABNORMAL HIGH (ref <0.031)

## 2015-08-16 MED ORDER — AMIODARONE LOAD VIA INFUSION
150.0000 mg | Freq: Once | INTRAVENOUS | Status: AC
  Administered 2015-08-16: 150 mg via INTRAVENOUS
  Filled 2015-08-16: qty 83.34

## 2015-08-16 MED ORDER — DIATRIZOATE MEGLUMINE & SODIUM 66-10 % PO SOLN
ORAL | Status: AC
  Filled 2015-08-16: qty 30

## 2015-08-16 MED ORDER — DIATRIZOATE MEGLUMINE & SODIUM 66-10 % PO SOLN
30.0000 mL | Freq: Once | ORAL | Status: AC
  Administered 2015-08-16: 30 mL via ORAL
  Filled 2015-08-16: qty 30

## 2015-08-16 MED ORDER — HEPARIN (PORCINE) IN NACL 100-0.45 UNIT/ML-% IJ SOLN
1300.0000 [IU]/h | INTRAMUSCULAR | Status: DC
  Administered 2015-08-16 – 2015-08-17 (×3): 1300 [IU]/h via INTRAVENOUS
  Filled 2015-08-16 (×2): qty 250

## 2015-08-16 MED ORDER — SODIUM CHLORIDE 0.9% FLUSH
10.0000 mL | INTRAVENOUS | Status: DC | PRN
  Administered 2015-08-28: 20 mL
  Administered 2015-08-29 – 2015-08-30 (×2): 10 mL
  Filled 2015-08-16 (×3): qty 40

## 2015-08-16 MED ORDER — INSULIN ASPART 100 UNIT/ML ~~LOC~~ SOLN
1.0000 [IU] | SUBCUTANEOUS | Status: DC
  Administered 2015-08-16: 2 [IU] via SUBCUTANEOUS

## 2015-08-16 MED ORDER — AMIODARONE HCL IN DEXTROSE 360-4.14 MG/200ML-% IV SOLN
60.0000 mg/h | INTRAVENOUS | Status: DC
  Administered 2015-08-16 (×2): 60 mg/h via INTRAVENOUS
  Filled 2015-08-16 (×2): qty 200

## 2015-08-16 MED ORDER — SODIUM ACETATE 2 MEQ/ML IV SOLN
INTRAVENOUS | Status: DC
  Administered 2015-08-16 – 2015-08-17 (×2): via INTRAVENOUS
  Filled 2015-08-16 (×2): qty 1000

## 2015-08-16 MED ORDER — ACETAMINOPHEN 160 MG/5ML PO SOLN
650.0000 mg | ORAL | Status: DC | PRN
Start: 2015-08-16 — End: 2015-09-05
  Administered 2015-08-16 – 2015-08-22 (×2): 650 mg via ORAL
  Filled 2015-08-16 (×3): qty 20.3

## 2015-08-16 MED ORDER — INSULIN ASPART 100 UNIT/ML ~~LOC~~ SOLN: 1.0000 [IU] | SUBCUTANEOUS | Status: DC

## 2015-08-16 MED ORDER — AMIODARONE HCL IN DEXTROSE 360-4.14 MG/200ML-% IV SOLN
30.0000 mg/h | INTRAVENOUS | Status: DC
  Administered 2015-08-16 – 2015-08-17 (×3): 30 mg/h via INTRAVENOUS
  Filled 2015-08-16 (×4): qty 200

## 2015-08-16 MED ORDER — PIPERACILLIN-TAZOBACTAM IN DEX 2-0.25 GM/50ML IV SOLN
2.2500 g | Freq: Three times a day (TID) | INTRAVENOUS | Status: DC
  Administered 2015-08-16 – 2015-08-18 (×5): 2.25 g via INTRAVENOUS
  Filled 2015-08-16 (×8): qty 50

## 2015-08-16 MED ORDER — SODIUM CHLORIDE 0.9% FLUSH
10.0000 mL | Freq: Two times a day (BID) | INTRAVENOUS | Status: DC
  Administered 2015-08-16: 10 mL

## 2015-08-16 MED ORDER — DEXTROSE 5 % IV SOLN
2.0000 g | INTRAVENOUS | Status: DC
  Filled 2015-08-16: qty 2

## 2015-08-16 MED ORDER — INSULIN GLARGINE 100 UNIT/ML ~~LOC~~ SOLN
20.0000 [IU] | SUBCUTANEOUS | Status: DC
  Administered 2015-08-16: 20 [IU] via SUBCUTANEOUS
  Filled 2015-08-16 (×2): qty 0.2

## 2015-08-16 MED ORDER — SODIUM CHLORIDE 0.9 % IV SOLN
INTRAVENOUS | Status: DC
  Administered 2015-08-16: 2.9 [IU]/h via INTRAVENOUS
  Filled 2015-08-16: qty 2.5

## 2015-08-16 NOTE — Progress Notes (Signed)
Pharmacy Antibiotic Note  Frank Hoover is a 65 y.o. male admitted on 08/14/2015 with septic shock.  Pharmacy has been consulted for Zosyn dosing for GNR bacteremia and intra-abdominal coverage.  Enterobacteriaceae per BCID 6/12. Also with GNR in 6/12 urine culture.  Changed to Cefepime on 6/13 and received 1 dose last night.  Back to Zosyn today for anaerobic coverage.  AKI, creatinine up to 4.8 today. BUN up to 58. On Norepi and Vasopressin.  275 ml out so far today.  Plan:  Zosyn 2.25 gm IV q8hrs.  Will follow renal function and adjust regimen as needed.  Follow up final cultures.  Height: 5\' 6"  (167.6 cm) Weight: 268 lb 15.4 oz (122 kg) IBW/kg (Calculated) : 63.8  Temp (24hrs), Avg:99.7 F (37.6 C), Min:98.1 F (36.7 C), Max:101.9 F (38.8 C)   Recent Labs Lab 08/14/15 1501 08/14/15 2301 08/14/15 2340 08/15/15 0202 08/15/15 0422 08/15/15 1630 08/16/15 0310  WBC 9.2  --   --   --   --  17.7* 10.8*  CREATININE 2.33* 2.41*  --   --  3.35* 4.60* 4.80*  LATICACIDVEN  --   --  5.1* 4.7*  --   --   --     Estimated Creatinine Clearance: 19.4 mL/min (by C-G formula based on Cr of 4.8).    No Known Allergies  Antimicrobials this admission:  Zosyn 6/12 >> 6/13; resume 6/14>>  Vanc 6/12 >> 6/13  Cefepime  6/13 >> 6/14 - only got 1 dose on 6/13 at ~11pm  Dose adjustments this admission:  Zosyn dose adjusted 6/14 with restart, as renal status has declined  Microbiology results:  6/12 blood cx: GNR in 1 of 2 cultures: Enterobacteriaceae per BCID  6/12 urine - >100 K/ml GNR - pending  6/12 MRSA screen: neg  Arty Baumgartner, RPh Pager: [phone] 08/16/2015 6:10 PM

## 2015-08-16 NOTE — Progress Notes (Signed)
RT assisted with transport to [facility] CT from 2H01 on 100% Fi02- uneventful. PT back in 2h01- RN at bedside. Current vent setting on Flow Sheet.

## 2015-08-16 NOTE — Care Management Important Message (Signed)
Important Message  Patient Details  Name: Frank Hoover MRN: [id number] Date of Birth: 1950-07-13   Medicare Important Message Given:  Yes    Loann Quill 08/16/2015, 8:05 AM

## 2015-08-16 NOTE — Progress Notes (Addendum)
Primary cardiologist :  Domenic Polite   PROGRESS NOTE  Subjective:   65 y.o.male with past medical history outlined below, referred today by his nephrologist for an echocardiogram secondary to elevated heart rate and leg edema. He then was referred from the cardiovascular lab to the ER after call placed to Dr. Lowanda Foster reporting patient heart rate in the 120s and associated dizziness. He has been found to be in atrial flutter with RVR of uncertain duration, and has evidence of a cardiomyopathy with LVEF approximately 25%.  He developed respiratory distress, temperature of 104 axillary, worsening tachycardia .  He was seen up at [facility] by Dr. Domenic Polite and was transferred to [facility] for further management.   Is on the vent. Has acute nonoliguric  renal failure - Cr. = 4.6 today    Objective:    Vital Signs:   Temp:  [97.7 F (36.5 C)-102.8 F (39.3 C)] 100.7 F (38.2 C) (06/14 0756) Pulse Rate:  [56-123] 121 (06/14 0303) Resp:  [0-36] 35 (06/14 0700) BP: (64-133)/(49-83) 133/69 mmHg (06/14 0303) SpO2:  [90 %-100 %] 100 % (06/14 0700) Arterial Line BP: (92-193)/(57-87) 109/57 mmHg (06/14 0700) FiO2 (%):  [60 %-100 %] 60 % (06/14 0303) Weight:  [264 lb 8.8 oz (120 kg)-268 lb 15.4 oz (122 kg)] 268 lb 15.4 oz (122 kg) (06/14 0500)  Last BM Date: 08/15/15   24-hour weight change: Weight change: -6 lb 7.2 oz (-2.925 kg)  Weight trends: Filed Weights   08/15/15 0500 08/15/15 1450 08/16/15 0500  Weight: 260 lb 12.9 oz (118.3 kg) 264 lb 8.8 oz (120 kg) 268 lb 15.4 oz (122 kg)    Intake/Output:  06/13 0701 - 06/14 0700 In: 2273.7 [I.V.:1673.7; IV Piggyback:600] Out: 827 [Urine:785; Emesis/NG output:100]     Physical Exam: BP 133/69 mmHg  Pulse 121  Temp(Src) 100.7 F (38.2 C) (Oral)  Resp 35  Ht 5' 6"  (1.676 m)  Wt 268 lb 15.4 oz (122 kg)  BMI 43.43 kg/m2  SpO2 100%  Wt Readings from Last 3 Encounters:  08/16/15 268 lb 15.4 oz (122 kg)  06/16/15 262 lb (118.842  kg)  06/22/14 265 lb (120.203 kg)    General: Vital signs reviewed and noted.  Intubated, sedated,   Head: ETT in place.   Eyes: conjunctivae/corneas clear.  EOM's intact.   Throat: normal  Neck:  normal   Lungs:    clear   Heart:  RR, tachy   Abdomen:  Soft, non-tender, non-distended    Extremities: 1+ edema    Neurologic: Sedated, on the vent   Psych:  NA    Labs: BMET:  Recent Labs  08/14/15 1501 08/14/15 2301 08/15/15 0422 08/15/15 1630  NA 138 136 136 131*  K 2.9* 4.0 3.4* 5.3*  CL 100* 99* 101 99*  CO2 29 21* 20* 15*  GLUCOSE 52* 263* 179* 306*  BUN 35* 37* 44* 49*  CREATININE 2.33* 2.41* 3.35* 4.60*  CALCIUM 10.1 9.6 9.0 7.9*  MG 2.1 1.8  --   --     Liver function tests:  Recent Labs  08/15/15 0422  AST 44*  ALT 24  ALKPHOS 64  BILITOT 0.7  PROT 5.9*  ALBUMIN 3.4*   No results for input(s): LIPASE, AMYLASE in the last 72 hours.  CBC:  Recent Labs  08/15/15 1630 08/15/15 2230 08/16/15 0310  WBC 17.7*  --  10.8*  NEUTROABS 15.4*  --   --   HGB 11.7* 11.4* 11.3*  HCT  36.1* 34.9* 34.9*  MCV 90.5  --  87.9  PLT 201  --  162    Cardiac Enzymes:  Recent Labs  08/14/15 2301 08/15/15 1630 08/15/15 2230 08/16/15 0310  TROPONINI 0.14* 0.23* 0.26* 0.28*    Coagulation Studies:  Recent Labs  08/14/15 1601  LABPROT 14.6  INR 1.12    Other: Invalid input(s): POCBNP No results for input(s): DDIMER in the last 72 hours.  Recent Labs  08/14/15 1501  HGBA1C 7.8*    Recent Labs  08/15/15 0422  CHOL 79  HDL 34*  LDLCALC 37  TRIG 38  CHOLHDL 2.3    Recent Labs  08/14/15 1827  TSH 1.917   No results for input(s): VITAMINB12, FOLATE, FERRITIN, TIBC, IRON, RETICCTPCT in the last 72 hours.  Urine is bloody   Other results:  EKG  ( personally reviewed )  - atrial flutter with RVR   Medications:    Infusions: . sodium chloride    . DOPamine    . epinephrine Stopped (08/15/15 1645)  . fentaNYL infusion  INTRAVENOUS 50 mcg/hr (08/15/15 1600)  . glucagon (GLUCAGEN) infusion (for beta blocker/calcium channel blocker overdose)    . heparin Stopped (08/15/15 1615)  . insulin (NOVOLIN-R) infusion 10.8 Units/hr (08/16/15 0743)  . norepinephrine (LEVOPHED) Adult infusion 35 mcg/min (08/16/15 0304)  .  sodium bicarbonate  infusion 1000 mL 75 mL/hr at 08/15/15 1800  . vasopressin (PITRESSIN) infusion - *FOR SHOCK* 0.03 Units/min (08/15/15 1900)    Scheduled Medications: . antiseptic oral rinse  7 mL Mouth Rinse QID  . ceFEPime (MAXIPIME) IV  1 g Intravenous Q24H  . chlorhexidine gluconate (SAGE KIT)  15 mL Mouth Rinse BID  . cholecalciferol  1,000 Units Oral Daily  . famotidine (PEPCID) IV  20 mg Intravenous Q24H  . fentaNYL (SUBLIMAZE) injection  50 mcg Intravenous Once  . linaclotide  290 mcg Oral QAC breakfast  . metoprolol tartrate  25 mg Oral BID  . simvastatin  10 mg Oral q1800  . sodium chloride flush  3 mL Intravenous Q12H    Assessment/ Plan:   Principal Problem:   Atrial flutter with rapid ventricular response (HCC) Active Problems:   GERD   DM (diabetes mellitus) ([place])   HTN (hypertension)   Hyperlipemia   Sleep apnea   Systolic CHF ([place])   Acute respiratory failure (HCC)   Septic shock (HCC)   Acute encephalopathy  1. Atrial flutter:   With RVR.  Marland Kitchen  HR remain very fast.    He has septic shock  , requiring pressors.  Likely would not tolerate Dilt or beta blockers.  Hesitate to give amiodarone but that may be the only way to slow him down.  He has not been anticoagulated  - he was on heparin but it was stopped due to profuse hematuria.    His tachycardia is likely being driven by his pneumonia and sepsis .  My hope is that his tachycardia improves as his sepsis improves.  Would be ideal to titrate his pressors down if possible to avoid tachycardia.   I discussed the case with Dr. Haroldine Laws who briefly saw the patient last night. He agrees with Amio to slow the heart. 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 Acute on chronic systolic CHF New Dx.   Has risk factors for CAD - needs to improve significantly before we can do an ischemia work up .   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   3. Acute on chronic renal insufficiency:   Further management per PCCM .  4.     Disposition:  Length of Stay: 2  Thayer Headings, Brooke Bonito., MD, [facility] 08/16/2015, 8:05 AM Office [phone] Pager [phone]

## 2015-08-16 NOTE — Progress Notes (Signed)
eLink Physician-Brief Progress Note Patient Name: Frank Hoover DOB: [birth year] MRN: [id number]   Date of Service  08/16/2015  HPI/Events of Note  OGT replaced.  eICU Interventions  Will order portable abdominal film for placement.      Intervention Category Minor Interventions: Clinical assessment - ordering diagnostic tests  Lysle Dingwall 08/16/2015, 12:09 AM

## 2015-08-16 NOTE — Progress Notes (Signed)
ANTICOAGULATION CONSULT NOTE   Pharmacy Consult for heparin Indication: atrial fibrillation  No Known Allergies  Patient Measurements: Height: 5\' 6"  (167.6 cm) Weight: 268 lb 15.4 oz (122 kg) IBW/kg (Calculated) : 63.8 HEPARIN DW (KG): 91.8  Vital Signs: Temp: 100.7 F (38.2 C) (06/14 0756) Temp Source: Oral (06/14 0756) BP: 115/61 mmHg (06/14 0853) Pulse Rate: 131 (06/14 0853)  Labs:  Recent Labs  08/14/15 1501 08/14/15 1601  08/14/15 2301 08/15/15 0422 08/15/15 1630 08/15/15 2230 08/16/15 0310  HGB 13.8  --   --   --   --  11.7* 11.4* 11.3*  HCT 40.2  --   --   --   --  36.1* 34.9* 34.9*  PLT 367  --   --   --   --  201  --  162  LABPROT  --  14.6  --   --   --   --   --   --   INR  --  1.12  --   --   --   --   --   --   HEPARINUNFRC  --   --   --  0.36 0.38  --   --   --   CREATININE 2.33*  --   --  2.41* 3.35* 4.60*  --   --   TROPONINI  --   --   < > 0.14*  --  0.23* 0.26* 0.28*  < > = values in this interval not displayed.  Estimated Creatinine Clearance: 20.2 mL/min (by C-G formula based on Cr of 4.6).   Medical History: Past Medical History  Diagnosis Date  . Gout   . COPD (chronic obstructive pulmonary disease) ([place])   . Type 2 diabetes mellitus ([place])   . GERD (gastroesophageal reflux disease)   . Essential hypertension   . Hyperlipemia   . OA (osteoarthritis)   . Gastroparesis   . Adenomatous colon polyp 10/30/09    Serrated adenoma removed during Colonoscopy   . Diverticulosis of colon   . CKD (chronic kidney disease) stage 3, GFR 30-59 ml/min     Medications:  See med rec   Assessment: Newly diagnosed atrial flutter with RVR. Patient has experienced  leg edema and intermittent shortness of breath over the last few weeks to months. Heparin was dc'd last night due to frank blood from urethra likely due to traumatic foley insertion. CBC is stable and urethra bleeding is resolved > resume heparin. AoCKD, eCrCl ~ 20 ml/min.   Goal of Therapy:   Heparin level 0.3-0.7 units/ml Monitor platelets by anticoagulation protocol: Yes    Plan:  -Restart heparin at 1300 units/hr -Daily HL, CBC -Check confirmatory level tonight   Thanks for allowing pharmacy to be a part of this patient's care.  Hughes Better, PharmD, BCPS Clinical Pharmacist Pager: [phone] 08/16/2015 10:42 AM

## 2015-08-16 NOTE — Progress Notes (Signed)
ANTICOAGULATION CONSULT NOTE - Follow Up Consult  Pharmacy Consult for Heparin Indication: atrial fibrillation  No Known Allergies  Patient Measurements: Height: 5\' 6"  (167.6 cm) Weight: 268 lb 15.4 oz (122 kg) IBW/kg (Calculated) : 63.8 Heparin Dosing Weight: 91.8 kg  Vital Signs: Temp: 99 F (37.2 C) (06/14 1632) Temp Source: Oral (06/14 1632) BP: 130/49 mmHg (06/14 1637) Pulse Rate: 92 (06/14 1637)  Labs:  Recent Labs  08/14/15 1501 08/14/15 1601  08/14/15 2301 08/15/15 0422 08/15/15 1630 08/15/15 2230 08/16/15 0310 08/16/15 1808  HGB 13.8  --   --   --   --  11.7* 11.4* 11.3*  --   HCT 40.2  --   --   --   --  36.1* 34.9* 34.9*  --   PLT 367  --   --   --   --  201  --  162  --   LABPROT  --  14.6  --   --   --   --   --   --   --   INR  --  1.12  --   --   --   --   --   --   --   HEPARINUNFRC  --   --   --  0.36 0.38  --   --   --  0.43  CREATININE 2.33*  --   --  2.41* 3.35* 4.60*  --  4.80*  --   TROPONINI  --   --   < > 0.14*  --  0.23* 0.26* 0.28*  --   < > = values in this interval not displayed.  Estimated Creatinine Clearance: 19.4 mL/min (by C-G formula based on Cr of 4.8).  Assessment:   Newly diagnosed atrial flutter with RVR. Patient has experienced leg edema and intermittent shortness of breath over the last few weeks to months. Heparin was stopped 6/13 pm due to frank blood from urethra, likely due to traumatic foley insertion. CBC stable and heparin resumed this morning.  RN reports urine is clearing.    Heparin level is therapeutic tonight (0.43) on 1300 units/hr.  Goal of Therapy:  Heparin level 0.3-0.7 units/ml Monitor platelets by anticoagulation protocol: Yes   Plan:   Continue heparin drip at 1300 units/hr.  Next heparin level and CBC in am.  Arty Baumgartner, [place] Pager: [phone] 08/16/2015,7:12 PM

## 2015-08-16 NOTE — Progress Notes (Signed)
RT note-COOX performed 6-13 at 1800 was re entered today for coding purposes.

## 2015-08-16 NOTE — Progress Notes (Signed)
PCCM PROGRESS NOTE  Subjective: Remains on pressors, vent.  Vital signs: BP 133/69 mmHg  Pulse 121  Temp(Src) 100.7 F (38.2 C) (Oral)  Resp 31  Ht 5' 6"  (1.676 m)  Wt 268 lb 15.4 oz (122 kg)  BMI 43.43 kg/m2  SpO2 99%  General: ill appearing Neuro: rass -2 HEENT: ETT in place Cardiac: regular, tachy Chest: no wheeze Abd: distended, increased tympany, no BS Ext: 1+ edema Skin: no rashes  CBC Recent Labs     08/14/15  1501  08/15/15  1630  08/15/15  2230  08/16/15  0310  WBC  9.2  17.7*   --   10.8*  HGB  13.8  11.7*  11.4*  11.3*  HCT  40.2  36.1*  34.9*  34.9*  PLT  367  201   --   162    Coag's Recent Labs     08/14/15  1601  INR  1.12    BMET Recent Labs     08/14/15  2301  08/15/15  0422  08/15/15  1630  NA  136  136  131*  K  4.0  3.4*  5.3*  CL  99*  101  99*  CO2  21*  20*  15*  BUN  37*  44*  49*  CREATININE  2.41*  3.35*  4.60*  GLUCOSE  263*  179*  306*    Electrolytes Recent Labs     08/14/15  1501  08/14/15  2301  08/15/15  0422  08/15/15  1630  CALCIUM  10.1  9.6  9.0  7.9*  MG  2.1  1.8   --    --     Sepsis Markers Recent Labs     08/14/15  2340  PROCALCITON  1.42    ABG Recent Labs     08/15/15  1723  08/15/15  1815  08/15/15  2018  PHART  7.131*  7.205*  7.291*  PCO2ART  46.5*  44.9  28.6*  PO2ART  78.0*  44.0*  122.0*    Liver Enzymes Recent Labs     08/15/15  0422  AST  44*  ALT  24  ALKPHOS  64  BILITOT  0.7  ALBUMIN  3.4*    Cardiac Enzymes Recent Labs     08/15/15  1630  08/15/15  2230  08/16/15  0310  TROPONINI  0.23*  0.26*  0.28*    Glucose Recent Labs     08/15/15  2340  08/16/15  0203  08/16/15  0302  08/16/15  0356  08/16/15  0458  08/16/15  0613  GLUCAP  347*  373*  349*  318*  316*  274*    Imaging Dg Chest 2 View  08/14/2015  CLINICAL DATA:  65 year old male with shortness of breath. EXAM: CHEST  2 VIEW COMPARISON:  Chest radiograph dated 12/02/2013 FINDINGS: The  heart size and mediastinal contours are within normal limits. Both lungs are clear. The visualized skeletal structures are unremarkable. IMPRESSION: No active cardiopulmonary disease. Electronically Signed   By: Anner Crete M.D.   On: 08/14/2015 15:52   US Renal  08/15/2015  CLINICAL DATA:  Acute kidney injury. EXAM: RENAL / URINARY TRACT ULTRASOUND COMPLETE COMPARISON:  Ultrasound of June 18, 2004. FINDINGS: Right Kidney: Length: 8.5 cm. Increased echogenicity of renal parenchyma is noted. No mass or hydronephrosis visualized. Left Kidney: Length: 10 cm. Echogenicity within normal limits. No mass or hydronephrosis visualized. Bladder: Appears normal for degree of bladder  distention. IMPRESSION: Mild right renal atrophy is noted with increased echogenicity of parenchyma of right kidney suggesting medical renal disease. No hydronephrosis or renal obstruction is noted. Left kidney appears normal. Electronically Signed   By: Marijo Conception, M.D.   On: 08/15/2015 12:46   Dg Chest Port 1 View  08/15/2015  CLINICAL DATA:  Endotracheal tube placement and central line placement. Nasogastric tube placement. Initial encounter. EXAM: PORTABLE CHEST 1 VIEW COMPARISON:  Chest radiograph performed 08/14/2015 FINDINGS: The patient's endotracheal tube is seen ending 1-2 cm above the carina. This could be retracted 1-2 cm, as deemed clinically appropriate. An right IJ line is noted ending about the mid SVC. An enteric tube is noted ending overlying the body of the stomach. The lungs are hypoexpanded. Right perihilar airspace opacity could reflect pneumonia. No pleural effusion or pneumothorax is seen. The cardiomediastinal silhouette is borderline normal in size. No acute osseous abnormalities are identified. External pacing pads are noted. IMPRESSION: 1. Endotracheal tube seen ending 1-2 cm above the carina. This could be retracted 1-2 cm, as deemed clinically appropriate. 2. Right IJ line noted ending about the mid  SVC. 3. Enteric tube seen extending overlying the body of the stomach. 4. Lungs hypoexpanded. Right perihilar airspace opacity could reflect pneumonia. Electronically Signed   By: Garald Balding M.D.   On: 08/15/2015 18:32   Dg Chest Port 1 View  08/14/2015  CLINICAL DATA:  Shortness of breath. Altered mental status. Bilateral leg edema. EXAM: PORTABLE CHEST 1 VIEW COMPARISON:  08/14/2015 FINDINGS: Examination is technically limited due to motion artifact. Shallow inspiration. Mild cardiac enlargement without vascular congestion. No focal airspace disease or consolidation in the lungs. No blunting of costophrenic angles. No pneumothorax. IMPRESSION: Shallow inspiration.  No evidence of active pulmonary disease. Electronically Signed   By: Lucienne Capers M.D.   On: 08/14/2015 23:42   Dg Abd Portable 1v  08/16/2015  CLINICAL DATA:  Malfunction of nasogastric tube. EXAM: PORTABLE ABDOMEN - 1 VIEW COMPARISON:  08/15/2015. FINDINGS: NG tube coiled in the stomach. Catheter noted in the pelvis. Prominently distended loops of what appear to be colon noted. Colonic ileus and/or colonic obstruction could present this fashion. Eighty sigmoid volvulus cannot be completely excluded. No free air. Follow-up exam suggested to demonstrate resolution. IMPRESSION: 1. NG tube noted coiled in the stomach. 2. Prominent dilated loops of bowel which appear to be colon noted. Colonic obstruction cannot be excluded. Sigmoid volvulus cannot be excluded. Follow-up abdominal series suggested to demonstrate resolution. Electronically Signed   By: Marcello Moores  Register   On: 08/16/2015 06:59     Assessment/plan:  Septic shock with GNR bacteremia. - continue pressors to keep MAP > 65  Acute respiratory failure, hypoxia. Hx of COPD. - full vent support, f/u CXR, ABG  Abdominal distention. - CT abd/pelvis  Acute on chronic systolic CHF. A flutter. - hold lopressor - amiodarone, heparin gtt  AKI. Hypokalemia. - replace  electrolytes as needed - monitor urine outpt  DM. - SSI  Acute encephalopathy. - RASS goal -2  CC time 35 minutes  Chesley Mires, MD [place] 08/16/2015, 9:05 AM Pager:  [phone] After 3pm call: [phone]

## 2015-08-16 NOTE — Progress Notes (Signed)
1 Day Post-Op  Subjective:  1. 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 No strictures or significant prostatic hypertrophy noted. Now with 23F foley in place to remain x several week to allow urethral healing.   2. Acute on chronic kidney disease - baselin Cr 1.5's. Rise to 4's this hospitalization. Has been hyptensive, on pressors, and some elemetn of urinary retention. [place] w/o hydro.   3. Possible sepsis of urinary origin - BCX enterobacter, UCX pending. Now on empiric maxipime.  Today "Frank Hoover" is stable. Some hematuria as expected following bedside procedure in setting of heparin. Remain tachycardic and on pressors.    Objective: Vital signs in last 24 hours: Temp:  [97.7 F (36.5 C)-102.8 F (39.3 C)] 99.1 F (37.3 C) (06/14 0348) Pulse Rate:  [56-123] 121 (06/14 0303) Resp:  [0-36] 35 (06/14 0600) BP: (64-133)/(49-83) 133/69 mmHg (06/14 0303) SpO2:  [90 %-100 %] 100 % (06/14 0600) Arterial Line BP: (92-193)/(57-87) 124/65 mmHg (06/14 0600) FiO2 (%):  [60 %-100 %] 60 % (06/14 0303) Weight:  [120 kg (264 lb 8.8 oz)-122 kg (268 lb 15.4 oz)] 122 kg (268 lb 15.4 oz) (06/14 0500) Last BM Date: 08/15/15  Intake/Output from previous day: 06/13 0701 - 06/14 0700 In: 2273.7 [I.V.:1673.7; IV Piggyback:600] Out: 885 [Urine:785; Emesis/NG output:100] Intake/Output this shift:    General appearance: GCS 3T, ill appearing in ICU Eyes: negative Throat: ETT in place  Back: symmetric, no curvature. ROM normal. No CVA tenderness. Resp: non-coarse respiration on vent Cardio: regular tachycardia by bedside monitor.  GI: stable truncal obesity Male genitalia: normal, foley c/d/i with medium bloody urine that is thin, no clots.  Skin: Skin color, texture, turgor normal. No rashes or lesions Neurologic: Mental status: GCS 3T  Lab Results:   Recent Labs  08/15/15 1630 08/15/15 2230 08/16/15 0310   WBC 17.7*  --  10.8*  HGB 11.7* 11.4* 11.3*  HCT 36.1* 34.9* 34.9*  PLT 201  --  162   BMET  Recent Labs  08/15/15 0422 08/15/15 1630  NA 136 131*  K 3.4* 5.3*  CL 101 99*  CO2 20* 15*  GLUCOSE 179* 306*  BUN 44* 49*  CREATININE 3.35* 4.60*  CALCIUM 9.0 7.9*   PT/INR  Recent Labs  08/14/15 1601  LABPROT 14.6  INR 1.12   ABG  Recent Labs  08/15/15 1815 08/15/15 2018  PHART 7.205* 7.291*  HCO3 17.1* 13.8*    Studies/Results: Dg Chest 2 View  08/14/2015  CLINICAL DATA:  65 year old male with shortness of breath. EXAM: CHEST  2 VIEW COMPARISON:  Chest radiograph dated 12/02/2013 FINDINGS: The heart size and mediastinal contours are within normal limits. Both lungs are clear. The visualized skeletal structures are unremarkable. IMPRESSION: No active cardiopulmonary disease. Electronically Signed   By: Anner Crete M.D.   On: 08/14/2015 15:52   US Renal  08/15/2015  CLINICAL DATA:  Acute kidney injury. EXAM: RENAL / URINARY TRACT ULTRASOUND COMPLETE COMPARISON:  Ultrasound of June 18, 2004. FINDINGS: Right Kidney: Length: 8.5 cm. Increased echogenicity of renal parenchyma is noted. No mass or hydronephrosis visualized. Left Kidney: Length: 10 cm. Echogenicity within normal limits. No mass or hydronephrosis visualized. Bladder: Appears normal for degree of bladder distention. IMPRESSION: Mild right renal atrophy is noted with increased echogenicity of parenchyma of right kidney suggesting medical renal disease. No hydronephrosis or renal obstruction is noted. Left kidney appears normal. Electronically Signed   By:  Marijo Conception, M.D.   On: 08/15/2015 12:46   Dg Chest Port 1 View  08/15/2015  CLINICAL DATA:  Endotracheal tube placement and central line placement. Nasogastric tube placement. Initial encounter. EXAM: PORTABLE CHEST 1 VIEW COMPARISON:  Chest radiograph performed 08/14/2015 FINDINGS: The patient's endotracheal tube is seen ending 1-2 cm above the carina.  This could be retracted 1-2 cm, as deemed clinically appropriate. An right IJ line is noted ending about the mid SVC. An enteric tube is noted ending overlying the body of the stomach. The lungs are hypoexpanded. Right perihilar airspace opacity could reflect pneumonia. No pleural effusion or pneumothorax is seen. The cardiomediastinal silhouette is borderline normal in size. No acute osseous abnormalities are identified. External pacing pads are noted. IMPRESSION: 1. Endotracheal tube seen ending 1-2 cm above the carina. This could be retracted 1-2 cm, as deemed clinically appropriate. 2. Right IJ line noted ending about the mid SVC. 3. Enteric tube seen extending overlying the body of the stomach. 4. Lungs hypoexpanded. Right perihilar airspace opacity could reflect pneumonia. Electronically Signed   By: Garald Balding M.D.   On: 08/15/2015 18:32   Dg Chest Port 1 View  08/14/2015  CLINICAL DATA:  Shortness of breath. Altered mental status. Bilateral leg edema. EXAM: PORTABLE CHEST 1 VIEW COMPARISON:  08/14/2015 FINDINGS: Examination is technically limited due to motion artifact. Shallow inspiration. Mild cardiac enlargement without vascular congestion. No focal airspace disease or consolidation in the lungs. No blunting of costophrenic angles. No pneumothorax. IMPRESSION: Shallow inspiration.  No evidence of active pulmonary disease. Electronically Signed   By: Lucienne Capers M.D.   On: 08/14/2015 23:42   Dg Abd Portable 1v  08/16/2015  CLINICAL DATA:  Malfunction of nasogastric tube. EXAM: PORTABLE ABDOMEN - 1 VIEW COMPARISON:  08/15/2015. FINDINGS: NG tube coiled in the stomach. Catheter noted in the pelvis. Prominently distended loops of what appear to be colon noted. Colonic ileus and/or colonic obstruction could present this fashion. Eighty sigmoid volvulus cannot be completely excluded. No free air. Follow-up exam suggested to demonstrate resolution. IMPRESSION: 1. NG tube noted coiled in the  stomach. 2. Prominent dilated loops of bowel which appear to be colon noted. Colonic obstruction cannot be excluded. Sigmoid volvulus cannot be excluded. Follow-up abdominal series suggested to demonstrate resolution. Electronically Signed   By: Marcello Moores  Register   On: 08/16/2015 06:59    Anti-infectives: Anti-infectives    Start     Dose/Rate Route Frequency Ordered Stop   08/17/15 1800  vancomycin (VANCOCIN) 1,500 mg in sodium chloride 0.9 % 500 mL IVPB  Status:  Discontinued     1,500 mg 250 mL/hr over 120 Minutes Intravenous Every 48 hours 08/15/15 1636 08/15/15 1956   08/16/15 2100  ceFEPIme (MAXIPIME) 1 g in dextrose 5 % 50 mL IVPB     1 g 100 mL/hr over 30 Minutes Intravenous Every 24 hours 08/15/15 1957     08/15/15 2100  ceFEPIme (MAXIPIME) 2 g in dextrose 5 % 50 mL IVPB     2 g 100 mL/hr over 30 Minutes Intravenous  Once 08/15/15 1957 08/15/15 2359   08/15/15 1730  vancomycin (VANCOCIN) 2,000 mg in sodium chloride 0.9 % 500 mL IVPB     2,000 mg 250 mL/hr over 120 Minutes Intravenous  Once 08/15/15 1636 08/15/15 2000   08/15/15 0900  piperacillin-tazobactam (ZOSYN) IVPB 3.375 g  Status:  Discontinued     3.375 g 12.5 mL/hr over 240 Minutes Intravenous Every 8 hours 08/15/15 0856  08/15/15 1956   08/15/15 0100  vancomycin (VANCOCIN) IVPB 1000 mg/200 mL premix     1,000 mg 200 mL/hr over 60 Minutes Intravenous STAT 08/15/15 0048 08/15/15 0206   08/14/15 2330  vancomycin (VANCOCIN) IVPB 1000 mg/200 mL premix     1,000 mg 200 mL/hr over 60 Minutes Intravenous STAT 08/14/15 2316 08/15/15 0041   08/14/15 2330  piperacillin-tazobactam (ZOSYN) IVPB 3.375 g     3.375 g 12.5 mL/hr over 240 Minutes Intravenous STAT 08/14/15 2317 08/15/15 0341      Assessment/Plan:  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 Some hematuria and peri-urethrla bleeding expected. Agree with mittens / soft restraints to help avoid pt tugging at lines / tubes.   2. Acute on  chronic kidney disease - multifoactorial, likely mostly pre and intrinsic renal. No hydro on [place] and decompressed bladder at present.   3. Possible sepsis of urinary origin - agree with empiric ABX. No hydro / large stones / surgically modifiable factors at present.  Will follow periodicaly in house. Call me directly with questions anytime.   [facility], Regina Ganci 08/16/2015

## 2015-08-16 NOTE — Progress Notes (Signed)
Inpatient Diabetes Program Recommendations  AACE/ADA: New Consensus Statement on Inpatient Glycemic Control (2015)  Target Ranges:  Prepandial:   less than 140 mg/dL      Peak postprandial:   less than 180 mg/dL (1-2 hours)      Critically ill patients:  140 - 180 mg/dL   Results for SUCCESS, DALLEY (MRN [id number]) as of 08/16/2015 09:54  Ref. Range 08/15/2015 11:17 08/15/2015 14:56 08/15/2015 23:40 08/16/2015 02:03 08/16/2015 03:02 08/16/2015 03:56 08/16/2015 04:58 08/16/2015 06:13  Glucose-Capillary Latest Ref Range: 65-99 mg/dL 145 (H) 264 (H) 347 (H) 373 (H) 349 (H) 318 (H) 316 (H) 274 (H)    Review of Glycemic Control  Diabetes history: DM 2 Outpatient Diabetes medications: Amaryl 2 QAM, Humalog 8-15 TID, Toujeo 15 QHS Current orders for Inpatient glycemic control: IV insulin drip per ICU protocol via [facility]  Inpatient Diabetes Program Recommendations:  Noted patient now on insulin drip for glycemic control. When patient ready for transition, basal to be given 2 hrs. prior to discontinuation with CBG covered with Novolog correction when IV insulin drip discontinued. Will follow.  Thank you, Nani Gasser. Alura Olveda, RN, MSN, CDE Inpatient Glycemic Control Team Team Pager [phone] (8am-5pm) 08/16/2015 9:59 AM

## 2015-08-16 NOTE — Progress Notes (Signed)
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

## 2015-08-17 ENCOUNTER — Inpatient Hospital Stay (HOSPITAL_COMMUNITY): Payer: Medicare Other

## 2015-08-17 LAB — URINE CULTURE: Culture: >=100000 — AB

## 2015-08-17 LAB — GLUCOSE, CAPILLARY
Glucose-Capillary: 193 mg/dL — ABNORMAL HIGH (ref 65–99)
Glucose-Capillary: 273 mg/dL — ABNORMAL HIGH (ref 65–99)
Glucose-Capillary: 315 mg/dL — ABNORMAL HIGH (ref 65–99)
Glucose-Capillary: 315 mg/dL — ABNORMAL HIGH (ref 65–99)
Glucose-Capillary: 69 mg/dL (ref 65–99)

## 2015-08-17 LAB — BLOOD GAS, ARTERIAL
Acid-base deficit: 5.4 mmol/L — ABNORMAL HIGH (ref 0.0–2.0)
Bicarbonate: 18.7 mEq/L — ABNORMAL LOW (ref 20.0–24.0)
Drawn by: 345601
FIO2: 0.6
MECHVT: 510 mL
O2 Saturation: 98 %
PEEP: 10 cmH2O
Patient temperature: 100.1
RATE: 35 resp/min
TCO2: 19.7 mmol/L (ref 0–100)
pCO2 arterial: 33.1 mmHg — ABNORMAL LOW (ref 35.0–45.0)
pH, Arterial: 7.374 (ref 7.350–7.450)
pO2, Arterial: 122 mmHg — ABNORMAL HIGH (ref 80.0–100.0)

## 2015-08-17 LAB — POCT I-STAT 3, ART BLOOD GAS (G3+)
Acid-base deficit: 1 mmol/L (ref 0.0–2.0)
Bicarbonate: 24.4 mEq/L — ABNORMAL HIGH (ref 20.0–24.0)
O2 Saturation: 91 %
Patient temperature: 99
TCO2: 26 mmol/L (ref 0–100)
pCO2 arterial: 40.5 mmHg (ref 35.0–45.0)
pH, Arterial: 7.388 (ref 7.350–7.450)
pO2, Arterial: 61 mmHg — ABNORMAL LOW (ref 80.0–100.0)

## 2015-08-17 LAB — COMPREHENSIVE METABOLIC PANEL
ALT: 89 U/L — ABNORMAL HIGH (ref 17–63)
AST: 109 U/L — ABNORMAL HIGH (ref 15–41)
Albumin: 1.8 g/dL — ABNORMAL LOW (ref 3.5–5.0)
Alkaline Phosphatase: 80 U/L (ref 38–126)
Anion gap: 19 — ABNORMAL HIGH (ref 5–15)
BUN: 72 mg/dL — ABNORMAL HIGH (ref 6–20)
CO2: 19 mmol/L — ABNORMAL LOW (ref 22–32)
Calcium: 6.5 mg/dL — ABNORMAL LOW (ref 8.9–10.3)
Chloride: 93 mmol/L — ABNORMAL LOW (ref 101–111)
Creatinine, Ser: 5.32 mg/dL — ABNORMAL HIGH (ref 0.61–1.24)
GFR calc Af Amer: 12 mL/min — ABNORMAL LOW (ref >60)
GFR calc non Af Amer: 10 mL/min — ABNORMAL LOW (ref >60)
Glucose, Bld: 309 mg/dL — ABNORMAL HIGH (ref 65–99)
Potassium: 4.6 mmol/L (ref 3.5–5.1)
Sodium: 131 mmol/L — ABNORMAL LOW (ref 135–145)
Total Bilirubin: 1.1 mg/dL (ref 0.3–1.2)
Total Protein: 4.5 g/dL — ABNORMAL LOW (ref 6.5–8.1)

## 2015-08-17 LAB — CBC
HCT: 31.2 % — ABNORMAL LOW (ref 39.0–52.0)
Hemoglobin: 10.4 g/dL — ABNORMAL LOW (ref 13.0–17.0)
MCH: 28.7 pg (ref 26.0–34.0)
MCHC: 33.3 g/dL (ref 30.0–36.0)
MCV: 86.2 fL (ref 78.0–100.0)
Platelets: 106 10*3/uL — ABNORMAL LOW (ref 150–400)
RBC: 3.62 MIL/uL — ABNORMAL LOW (ref 4.22–5.81)
RDW: 15.1 % (ref 11.5–15.5)
WBC: 10.6 10*3/uL — ABNORMAL HIGH (ref 4.0–10.5)

## 2015-08-17 LAB — CARBOXYHEMOGLOBIN
Carboxyhemoglobin: 1.4 % (ref 0.5–1.5)
Methemoglobin: 1 % (ref 0.0–1.5)
O2 Saturation: 61.5 %
Total hemoglobin: 11.5 g/dL — ABNORMAL LOW (ref 13.5–18.0)

## 2015-08-17 LAB — LACTIC ACID, PLASMA
Lactic Acid, Venous: 2.6 mmol/L (ref 0.5–2.0)
Lactic Acid, Venous: 2.2 mmol/L (ref 0.5–2.0)

## 2015-08-17 LAB — BASIC METABOLIC PANEL
Anion gap: 14 (ref 5–15)
BUN: 75 mg/dL — ABNORMAL HIGH (ref 6–20)
CO2: 24 mmol/L (ref 22–32)
Calcium: 6.4 mg/dL — CL (ref 8.9–10.3)
Chloride: 95 mmol/L — ABNORMAL LOW (ref 101–111)
Creatinine, Ser: 5.24 mg/dL — ABNORMAL HIGH (ref 0.61–1.24)
GFR calc Af Amer: 12 mL/min — ABNORMAL LOW (ref >60)
GFR calc non Af Amer: 11 mL/min — ABNORMAL LOW (ref >60)
Glucose, Bld: 182 mg/dL — ABNORMAL HIGH (ref 65–99)
Potassium: 3.5 mmol/L (ref 3.5–5.1)
Sodium: 133 mmol/L — ABNORMAL LOW (ref 135–145)

## 2015-08-17 LAB — HEPARIN LEVEL (UNFRACTIONATED): Heparin Unfractionated: 0.57 IU/mL (ref 0.30–0.70)

## 2015-08-17 MED ORDER — DEXTROSE 50 % IV SOLN
INTRAVENOUS | Status: AC
  Administered 2015-08-17: 25 mL
  Filled 2015-08-17: qty 50

## 2015-08-17 MED ORDER — DEXTROSE 50 % IV SOLN: 1.0000 | Freq: Once | INTRAVENOUS | Status: AC

## 2015-08-17 MED ORDER — INSULIN GLARGINE 100 UNIT/ML ~~LOC~~ SOLN
20.0000 [IU] | Freq: Every day | SUBCUTANEOUS | Status: DC
  Administered 2015-08-17 – 2015-08-19 (×3): 20 [IU] via SUBCUTANEOUS
  Filled 2015-08-17 (×5): qty 0.2

## 2015-08-17 MED ORDER — AMIODARONE IV BOLUS ONLY 150 MG/100ML
150.0000 mg | Freq: Once | INTRAVENOUS | Status: AC
  Administered 2015-08-17: 150 mg via INTRAVENOUS

## 2015-08-17 MED ORDER — AMIODARONE LOAD VIA INFUSION
150.0000 mg | Freq: Once | INTRAVENOUS | Status: AC
  Administered 2015-08-17: 150 mg via INTRAVENOUS
  Filled 2015-08-17: qty 83.34

## 2015-08-17 MED ORDER — FUROSEMIDE 10 MG/ML IJ SOLN
40.0000 mg | Freq: Once | INTRAMUSCULAR | Status: AC
Start: 2015-08-17 — End: 2015-08-17
  Administered 2015-08-17: 40 mg via INTRAVENOUS
  Filled 2015-08-17: qty 4

## 2015-08-17 MED ORDER — DEXMEDETOMIDINE HCL IN NACL 200 MCG/50ML IV SOLN
0.4000 ug/kg/h | INTRAVENOUS | Status: DC
  Administered 2015-08-17 – 2015-08-18 (×3): 0.4 ug/kg/h via INTRAVENOUS
  Filled 2015-08-17 (×4): qty 50

## 2015-08-17 MED ORDER — SODIUM CHLORIDE 0.9 % IV SOLN
1.0000 g | Freq: Once | INTRAVENOUS | Status: AC
  Administered 2015-08-17: 1 g via INTRAVENOUS
  Filled 2015-08-17: qty 10

## 2015-08-17 MED ORDER — INSULIN ASPART 100 UNIT/ML ~~LOC~~ SOLN
0.0000 [IU] | SUBCUTANEOUS | Status: DC
  Administered 2015-08-17: 11 [IU] via SUBCUTANEOUS
  Administered 2015-08-17: 3 [IU] via SUBCUTANEOUS
  Administered 2015-08-17: 11 [IU] via SUBCUTANEOUS
  Administered 2015-08-17 (×2): 8 [IU] via SUBCUTANEOUS
  Administered 2015-08-18: 2 [IU] via SUBCUTANEOUS
  Administered 2015-08-18: 3 [IU] via SUBCUTANEOUS
  Administered 2015-08-18: 2 [IU] via SUBCUTANEOUS
  Administered 2015-08-18: 3 [IU] via SUBCUTANEOUS
  Administered 2015-08-18: 2 [IU] via SUBCUTANEOUS
  Administered 2015-08-19: 3 [IU] via SUBCUTANEOUS
  Administered 2015-08-19: 2 [IU] via SUBCUTANEOUS
  Administered 2015-08-19: 3 [IU] via SUBCUTANEOUS
  Administered 2015-08-19: 5 [IU] via SUBCUTANEOUS
  Administered 2015-08-19: 2 [IU] via SUBCUTANEOUS
  Administered 2015-08-19: 3 [IU] via SUBCUTANEOUS
  Administered 2015-08-20: 11 [IU] via SUBCUTANEOUS
  Administered 2015-08-20: 5 [IU] via SUBCUTANEOUS
  Administered 2015-08-20: 11 [IU] via SUBCUTANEOUS
  Administered 2015-08-20 – 2015-08-21 (×5): 8 [IU] via SUBCUTANEOUS
  Administered 2015-08-21: 11 [IU] via SUBCUTANEOUS
  Administered 2015-08-21: 5 [IU] via SUBCUTANEOUS

## 2015-08-17 MED ORDER — SODIUM CHLORIDE 0.9 % IV SOLN
INTRAVENOUS | Status: DC
  Administered 2015-08-17 – 2015-08-19 (×4): via INTRAVENOUS

## 2015-08-17 MED ORDER — METOPROLOL TARTRATE 5 MG/5ML IV SOLN
5.0000 mg | INTRAVENOUS | Status: DC | PRN
  Administered 2015-08-17 – 2015-08-21 (×5): 5 mg via INTRAVENOUS
  Filled 2015-08-17 (×5): qty 5

## 2015-08-17 NOTE — Progress Notes (Signed)
2 Days Post-Op  Subjective:  1. 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 No strictures or significant prostatic hypertrophy noted. Now with 70F foley in place to remain x several week to allow urethral healing.   2. Acute on chronic kidney disease - baselin Cr 1.5's. Rise to 5's this hospitalization. Has been hyptensive, on pressors, and some elemetn of urinary retention. [place] w/o hydro. CT 6/14 no hydro.    3. Possible sepsis of urinary origin - BCX enterobacter/pending in 1/2  Bottles (from groin line), UCX citrobacter (pan-sensitive). Now on empiric maxipime. CT 6/14 no hydro or severe perinephric stranding.   Today "Frank Hoover" remain critical condition. Some wean of pressors.   Objective: Vital signs in last 24 hours: Temp:  [99 F (37.2 C)-101.3 F (38.5 C)] 99 F (37.2 C) (06/15 1549) Pulse Rate:  [85-110] 89 (06/15 1550) Resp:  [14-35] 14 (06/15 1700) BP: (102-147)/(62-84) 140/62 mmHg (06/15 0312) SpO2:  [84 %-100 %] 100 % (06/15 1700) Arterial Line BP: (98-163)/(52-75) 134/62 mmHg (06/15 1700) FiO2 (%):  [40 %-100 %] 100 % (06/15 1700) Weight:  [123.1 kg (271 lb 6.2 oz)] 123.1 kg (271 lb 6.2 oz) (06/15 0407) Last BM Date: 08/17/15  Intake/Output from previous day: 06/14 0701 - 06/15 0700 In: 5194.8 [I.V.:3784.8; NG/GT:1260; IV Piggyback:150] Out: 5697 [Urine:1420] Intake/Output this shift: Total I/O In: 850.4 [I.V.:750.4; IV Piggyback:100] Out: 250 [Urine:250]  General appearance: no distress Eyes: negative Throat: lips, mucosa, and tongue normal; teeth and gums normal and ETT in place. gastrci tube in place Back: symmetric, no curvature. ROM normal. No CVA tenderness. Resp: non-coars on vent Cardio: tach 120s narrow complex by bedside monitor Male genitalia: normal, foley c/d/e with non-bloody urine. Foreskin reduced / no paraphimosis.  Extremities: extremities normal, atraumatic,  no cyanosis or edema Neurologic: Mental status: GCS 3T  Lab Results:   Recent Labs  08/16/15 0310 08/17/15 0438  WBC 10.8* 10.6*  HGB 11.3* 10.4*  HCT 34.9* 31.2*  PLT 162 106*   BMET  Recent Labs  08/17/15 0438 08/17/15 1630  NA 131* 133*  K 4.6 3.5  CL 93* 95*  CO2 19* 24  GLUCOSE 309* 182*  BUN 72* 75*  CREATININE 5.32* 5.24*  CALCIUM 6.5* 6.4*   PT/INR No results for input(s): LABPROT, INR in the last 72 hours. ABG  Recent Labs  08/17/15 0255 08/17/15 1631  PHART 7.374 7.388  HCO3 18.7* 24.4*    Studies/Results: Ct Abdomen Pelvis Wo Contrast  08/16/2015  CLINICAL DATA:  65 year old male with history of abdominal distention. EXAM: CT ABDOMEN AND PELVIS WITHOUT CONTRAST TECHNIQUE: Multidetector CT imaging of the abdomen and pelvis was performed following the standard protocol without IV contrast. COMPARISON:  No priors. FINDINGS: Lower chest: Cardiomegaly. Atherosclerotic calcifications in the right coronary artery. Trace bilateral pleural effusions. Hepatobiliary: No definite cystic or solid hepatic lesions are identified within the liver on today's noncontrast CT examination. Unenhanced appearance of the gallbladder is normal. Pancreas: Atrophy in the pancreas. No definite pancreatic mass. Stranding throughout the retroperitoneum is noted, including adjacent to the pancreas. No well-defined peripancreatic fluid collections are identified. Spleen: Unremarkable. Adrenals/Urinary Tract: Bilateral perinephric stranding (nonspecific). Unenhanced appearance of the kidneys and bilateral adrenal glands is otherwise unremarkable. No hydroureteronephrosis to indicate urinary tract obstruction at this time. Foley balloon catheter inside the urinary bladder which is nearly completely decompressed. Stomach/Bowel: Unenhanced appearance of the stomach is normal. Tip of nasogastric tube  in the body of the stomach. There is extensive colonic wall thickening in the sigmoid colon and  rectal wall thickening, suggestive of a distal proctocolitis. Haziness in the mesorectum and distal sigmoid mesocolon this presumably secondary to inflammation. No pathologic dilatation of small bowel or colon. Normal appendix. Vascular/Lymphatic: Atherosclerotic calcifications throughout the abdominal and pelvic vasculature, without definite aneurysm. No lymphadenopathy noted in the abdomen or pelvis. Reproductive: Prostate gland and seminal vesicles are unremarkable in appearance. Other: Small umbilical hernia containing only omental fat. Trace volume of ascites. No pneumoperitoneum. Mild diffuse body wall edema. Musculoskeletal: There are no aggressive appearing lytic or blastic lesions noted in the visualized portions of the skeleton. IMPRESSION: 1. Thickening of the sigmoid colon and rectal wall with surrounding inflammatory changes in the adjacent mesocolon/mesorectum, concerning for distal proctocolitis. 2. Trace bilateral pleural effusions, trace volume of ascites, retroperitoneal edema and diffuse mild body wall edema suggestive of a state of mild anasarca. 3. Atherosclerosis, including right coronary artery disease. Please note that although the presence of coronary artery calcium documents the presence of coronary artery disease, the severity of this disease and any potential stenosis cannot be assessed on this non-gated CT examination. Assessment for potential risk factor modification, dietary therapy or pharmacologic therapy may be warranted, if clinically indicated. 4. Cardiomegaly. Electronically Signed   By: Vinnie Langton M.D.   On: 08/16/2015 13:45   Dg Chest Port 1 View  08/17/2015  CLINICAL DATA:  Respiratory failure. EXAM: PORTABLE CHEST 1 VIEW COMPARISON:  08/15/2015. FINDINGS: Endotracheal tube 3 cm above the carina on today's exam. NG tube, right IJ line stable position. Heart size normal. Right mid lung and bibasilar subsegmental atelectasis. No pleural effusion or pneumothorax.  IMPRESSION: 1. Endotracheal tube 3 cm above the carina on today's exam. NG tube and right IJ line stable position. 2. Right mid lung and bibasilar subsegmental atelectasis. Electronically Signed   By: Marcello Moores  Register   On: 08/17/2015 06:56   Dg Chest Port 1 View  08/15/2015  CLINICAL DATA:  Endotracheal tube placement and central line placement. Nasogastric tube placement. Initial encounter. EXAM: PORTABLE CHEST 1 VIEW COMPARISON:  Chest radiograph performed 08/14/2015 FINDINGS: The patient's endotracheal tube is seen ending 1-2 cm above the carina. This could be retracted 1-2 cm, as deemed clinically appropriate. An right IJ line is noted ending about the mid SVC. An enteric tube is noted ending overlying the body of the stomach. The lungs are hypoexpanded. Right perihilar airspace opacity could reflect pneumonia. No pleural effusion or pneumothorax is seen. The cardiomediastinal silhouette is borderline normal in size. No acute osseous abnormalities are identified. External pacing pads are noted. IMPRESSION: 1. Endotracheal tube seen ending 1-2 cm above the carina. This could be retracted 1-2 cm, as deemed clinically appropriate. 2. Right IJ line noted ending about the mid SVC. 3. Enteric tube seen extending overlying the body of the stomach. 4. Lungs hypoexpanded. Right perihilar airspace opacity could reflect pneumonia. Electronically Signed   By: Garald Balding M.D.   On: 08/15/2015 18:32   Dg Abd Portable 1v  08/16/2015  CLINICAL DATA:  Malfunction of nasogastric tube. EXAM: PORTABLE ABDOMEN - 1 VIEW COMPARISON:  08/15/2015. FINDINGS: NG tube coiled in the stomach. Catheter noted in the pelvis. Prominently distended loops of what appear to be colon noted. Colonic ileus and/or colonic obstruction could present this fashion. Eighty sigmoid volvulus cannot be completely excluded. No free air. Follow-up exam suggested to demonstrate resolution. IMPRESSION: 1. NG tube noted coiled in the stomach.  2.  Prominent dilated loops of bowel which appear to be colon noted. Colonic obstruction cannot be excluded. Sigmoid volvulus cannot be excluded. Follow-up abdominal series suggested to demonstrate resolution. Electronically Signed   By: Marcello Moores  Register   On: 08/16/2015 06:59    Anti-infectives: Anti-infectives    Start     Dose/Rate Route Frequency Ordered Stop   08/17/15 1800  vancomycin (VANCOCIN) 1,500 mg in sodium chloride 0.9 % 500 mL IVPB  Status:  Discontinued     1,500 mg 250 mL/hr over 120 Minutes Intravenous Every 48 hours 08/15/15 1636 08/15/15 1956   08/16/15 2100  ceFEPIme (MAXIPIME) 1 g in dextrose 5 % 50 mL IVPB  Status:  Discontinued     1 g 100 mL/hr over 30 Minutes Intravenous Every 24 hours 08/15/15 1957 08/16/15 1415   08/16/15 2100  ceFEPIme (MAXIPIME) 2 g in dextrose 5 % 50 mL IVPB  Status:  Discontinued     2 g 100 mL/hr over 30 Minutes Intravenous Every 24 hours 08/16/15 1415 08/16/15 1741   08/16/15 1900  piperacillin-tazobactam (ZOSYN) IVPB 2.25 g     2.25 g 100 mL/hr over 30 Minutes Intravenous Every 8 hours 08/16/15 1809     08/15/15 2100  ceFEPIme (MAXIPIME) 2 g in dextrose 5 % 50 mL IVPB     2 g 100 mL/hr over 30 Minutes Intravenous  Once 08/15/15 1957 08/15/15 2359   08/15/15 1730  vancomycin (VANCOCIN) 2,000 mg in sodium chloride 0.9 % 500 mL IVPB     2,000 mg 250 mL/hr over 120 Minutes Intravenous  Once 08/15/15 1636 08/15/15 2000   08/15/15 0900  piperacillin-tazobactam (ZOSYN) IVPB 3.375 g  Status:  Discontinued     3.375 g 12.5 mL/hr over 240 Minutes Intravenous Every 8 hours 08/15/15 0856 08/15/15 1956   08/15/15 0100  vancomycin (VANCOCIN) IVPB 1000 mg/200 mL premix     1,000 mg 200 mL/hr over 60 Minutes Intravenous STAT 08/15/15 0048 08/15/15 0206   08/14/15 2330  vancomycin (VANCOCIN) IVPB 1000 mg/200 mL premix     1,000 mg 200 mL/hr over 60 Minutes Intravenous STAT 08/14/15 2316 08/15/15 0041   08/14/15 2330  piperacillin-tazobactam (ZOSYN) IVPB  3.375 g     3.375 g 12.5 mL/hr over 240 Minutes Intravenous STAT 08/14/15 2317 08/15/15 0341      Assessment/Plan:  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 Some hematuria and peri-urethrla bleeding expected, this is improving. Agree with continued mittens / soft restraints to help avoid pt tugging at lines / tubes.   2. Acute on chronic kidney disease - multifoactorial, likely mostly pre and intrinsic renal. No hydro on [place] or CT this admission  and decompressed bladder at present.   3. Possible sepsis of urinary origin - agree with empiric ABX. No hydro / large stones / surgically modifiable factors at present by detailed Gu imaging.  Will follow periodicaly in house. Call me directly with questions anytime.   [facility], Frank Hoover 08/17/2015

## 2015-08-17 NOTE — Progress Notes (Signed)
CRITICAL VALUE ALERT  Critical value received:  Calcium 6.4  Date of notification:  08/17/15  Time of notification:  1713  Critical value read back:Yes.    Nurse who received alert:  Newman Nickels RN  MD notified (1st page):  Dr. Lake Bells  Time of first page:  1714

## 2015-08-17 NOTE — Progress Notes (Signed)
   08/17/15 1100  Clinical Encounter Type  Visited With Patient and family together  Visit Type Initial  Referral From Chaplain  Consult/Referral To Chaplain  Spiritual Encounters  Spiritual Needs Emotional;Prayer  Stress Factors  Patient Stress Factors Major life changes  Family Stress Factors Exhausted;Loss of control  CHP stopped in room on rounds and spoke with patient's wife. Wife has been here for two days since husband was admitted, sleeping in waiting room. CHP prayer for patient and wife. CHP inquired about wife's self-care and availability of other family/friend support. Wife indicated little support. Wife said she was diabetic.  CHP asked if she had been to cafeteria. Wife indicated she had no money.  CHP returned with $20 meal tickets. Wife doesn't drive so [facility] not an option. CHP is available to provide continued support and care. Roe Coombs 08/17/2015

## 2015-08-17 NOTE — Progress Notes (Signed)
Inpatient Diabetes Program Recommendations  AACE/ADA: New Consensus Statement on Inpatient Glycemic Control (2015)  Target Ranges:  Prepandial:   less than 140 mg/dL      Peak postprandial:   less than 180 mg/dL (1-2 hours)      Critically ill patients:  140 - 180 mg/dL   Lab Results  Component Value Date   GLUCAP 315* 08/17/2015   HGBA1C 7.8* 08/14/2015    Review of Glycemic Control Results for Frank Hoover, Frank Hoover (MRN [id number]) as of 08/17/2015 11:13  Ref. Range 08/16/2015 23:49 08/17/2015 04:22 08/17/2015 08:13  Glucose-Capillary Latest Ref Range: 65-99 mg/dL 255 (H) 315 (H) 315 (H)    Diabetes history: DM 2 Outpatient Diabetes medications: Amaryl 2mg  daily and humalog correction/meal coverage tidwc and Toujeo 15 units daily Current orders for Inpatient glycemic control: Lantus 20 units and moderate correction scale q 4 hrs.  Inpatient Diabetes Program Recommendations:    Patient's glucose indicates that he may need an increase in basal insulin. Would recommend increase to 25 units and use levemir rather than lantus as this tends to be safer with renal complications Please consider levemir 25 units daily and increase as needed by 2-4 units daily units until controlled.  Otherwise the ICU hyperglycemia protcol to get glucose controlled.  Thank you Rosita Kea, RN, MSN, CDE  Diabetes Inpatient Program Office: [phone] Pager: [phone] 8:00 am to 5:00 pm

## 2015-08-17 NOTE — Progress Notes (Signed)
ANTICOAGULATION CONSULT NOTE   Pharmacy Consult for heparin Indication: atrial fibrillation  No Known Allergies  Patient Measurements: Height: 5\' 6"  (167.6 cm) Weight: 271 lb 6.2 oz (123.1 kg) IBW/kg (Calculated) : 63.8 HEPARIN DW (KG): 91.8  Vital Signs: Temp: 101 F (38.3 C) (06/15 0736) Temp Source: Oral (06/15 0736) BP: 140/62 mmHg (06/15 0312) Pulse Rate: 89 (06/15 0703)  Labs:  Recent Labs  08/14/15 1601  08/15/15 0422 08/15/15 1630 08/15/15 2230 08/16/15 0310 08/16/15 1808 08/17/15 0438 08/17/15 0439  HGB  --   --   --  11.7* 11.4* 11.3*  --  10.4*  --   HCT  --   --   --  36.1* 34.9* 34.9*  --  31.2*  --   PLT  --   --   --  201  --  162  --  106*  --   LABPROT 14.6  --   --   --   --   --   --   --   --   INR 1.12  --   --   --   --   --   --   --   --   HEPARINUNFRC  --   < > 0.38  --   --   --  0.43  --  0.57  CREATININE  --   < > 3.35* 4.60*  --  4.80*  --  5.32*  --   TROPONINI  --   < >  --  0.23* 0.26* 0.28*  --   --   --   < > = values in this interval not displayed.  Estimated Creatinine Clearance: 17.6 mL/min (by C-G formula based on Cr of 5.32).   Medical History: Past Medical History  Diagnosis Date  . Gout   . COPD (chronic obstructive pulmonary disease) ([place])   . Type 2 diabetes mellitus ([place])   . GERD (gastroesophageal reflux disease)   . Essential hypertension   . Hyperlipemia   . OA (osteoarthritis)   . Gastroparesis   . Adenomatous colon polyp 10/30/09    Serrated adenoma removed during Colonoscopy   . Diverticulosis of colon   . CKD (chronic kidney disease) stage 3, GFR 30-59 ml/min     Medications:  See EMR  Assessment: Newly diagnosed atrial flutter with RVR. Patient has experienced  leg edema and intermittent shortness of breath over the last few weeks to months. Heparin was dc'd on 6/13 due to frank blood from urethra likely due to traumatic foley insertion. CBC is stable and urethra bleeding is resolved > heparin was  resumed yesterday. AoCKD, eCrCl ~ 20 ml/min. Heparin level is trending up, hgb 10.4, plts trending down, 106 today.   Goal of Therapy:  Heparin level 0.3-0.7 units/ml Monitor platelets by anticoagulation protocol: Yes    Plan:  -Heparin at 1300 units/hr -Daily HL, CBC    Thanks for allowing pharmacy to be a part of this patient's care.  Hughes Better, PharmD, BCPS Clinical Pharmacist Pager: [phone] 08/17/2015 8:21 AM

## 2015-08-17 NOTE — Progress Notes (Signed)
Primary cardiologist :  Domenic Polite   PROGRESS NOTE  Subjective:   65 y.o.male with past medical history outlined below, referred today by his nephrologist for an echocardiogram secondary to elevated heart rate and leg edema. He then was referred from the cardiovascular lab to the ER after call placed to Dr. Lowanda Foster reporting patient heart rate in the 120s and associated dizziness. He has been found to be in atrial flutter with RVR of uncertain duration, and has evidence of a cardiomyopathy with LVEF approximately 25%.  He developed respiratory distress, temperature of 104 axillary, worsening tachycardia .  He was seen up at [facility] by Dr. Domenic Polite and was transferred to [facility] for further management.   Is on the vent. Has acute nonoliguric  renal failure - Cr. = 5.3 today    Objective:    Vital Signs:   Temp:  [99 F (37.2 C)-101.9 F (38.8 C)] 101 F (38.3 C) (06/15 0736) Pulse Rate:  [85-131] 89 (06/15 0703) Resp:  [18-36] 31 (06/15 0703) BP: (102-147)/(49-84) 140/62 mmHg (06/15 0312) SpO2:  [90 %-100 %] 98 % (06/15 0703) Arterial Line BP: (103-163)/(51-75) 149/64 mmHg (06/15 0600) FiO2 (%):  [60 %] 60 % (06/15 0703) Weight:  [271 lb 6.2 oz (123.1 kg)] 271 lb 6.2 oz (123.1 kg) (06/15 0407)  Last BM Date: 08/16/15   24-hour weight change: Weight change: 6 lb 13.4 oz (3.1 kg)  Weight trends: Filed Weights   08/15/15 1450 08/16/15 0500 08/17/15 0407  Weight: 264 lb 8.8 oz (120 kg) 268 lb 15.4 oz (122 kg) 271 lb 6.2 oz (123.1 kg)    Intake/Output:  06/14 0701 - 06/15 0700 In: 5044 [I.V.:3634; NG/GT:1260; IV Piggyback:150] Out: 5809 [Urine:1420]     Physical Exam: BP 140/62 mmHg  Pulse 89  Temp(Src) 101 F (38.3 C) (Oral)  Resp 31  Ht 5' 6"  (1.676 m)  Wt 271 lb 6.2 oz (123.1 kg)  BMI 43.82 kg/m2  SpO2 98%  Wt Readings from Last 3 Encounters:  08/17/15 271 lb 6.2 oz (123.1 kg)  06/16/15 262 lb (118.842 kg)  06/22/14 265 lb (120.203 kg)     General: Vital signs reviewed and noted.  Intubated, sedated,   Head: ETT in place.   Eyes: conjunctivae/corneas clear.  EOM's intact.   Throat: normal  Neck:  normal   Lungs:    clear   Heart:  RR,   Abdomen:  Soft, non-tender, non-distended    Extremities: 1+ edema    Neurologic: Sedated, on the vent   Psych:  NA    Labs: BMET:  Recent Labs  08/14/15 1501 08/14/15 2301  08/16/15 0310 08/17/15 0438  NA 138 136  < > 133* 131*  K 2.9* 4.0  < > 5.0 4.6  CL 100* 99*  < > 98* 93*  CO2 29 21*  < > 18* 19*  GLUCOSE 52* 263*  < > 350* 309*  BUN 35* 37*  < > 58* 72*  CREATININE 2.33* 2.41*  < > 4.80* 5.32*  CALCIUM 10.1 9.6  < > 7.2* 6.5*  MG 2.1 1.8  --   --   --   < > = values in this interval not displayed.  Liver function tests:  Recent Labs  08/15/15 0422 08/17/15 0438  AST 44* 109*  ALT 24 89*  ALKPHOS 64 80  BILITOT 0.7 1.1  PROT 5.9* 4.5*  ALBUMIN 3.4* 1.8*   No results for input(s): LIPASE, AMYLASE in the last 72  hours.  CBC:  Recent Labs  08/15/15 1630  08/16/15 0310 08/17/15 0438  WBC 17.7*  --  10.8* 10.6*  NEUTROABS 15.4*  --   --   --   HGB 11.7*  < > 11.3* 10.4*  HCT 36.1*  < > 34.9* 31.2*  MCV 90.5  --  87.9 86.2  PLT 201  --  162 106*  < > = values in this interval not displayed.  Cardiac Enzymes:  Recent Labs  08/14/15 2301 08/15/15 1630 08/15/15 2230 08/16/15 0310  TROPONINI 0.14* 0.23* 0.26* 0.28*    Coagulation Studies:  Recent Labs  08/14/15 1601  LABPROT 14.6  INR 1.12    Other: Invalid input(s): POCBNP No results for input(s): DDIMER in the last 72 hours.  Recent Labs  08/14/15 1501  HGBA1C 7.8*    Recent Labs  08/15/15 0422  CHOL 79  HDL 34*  LDLCALC 37  TRIG 38  CHOLHDL 2.3    Recent Labs  08/14/15 1827  TSH 1.917   No results for input(s): VITAMINB12, FOLATE, FERRITIN, TIBC, IRON, RETICCTPCT in the last 72 hours.  Urine is bloody   Other results:  EKG  ( personally reviewed )  -  NSR at 90.  Episodes of atrial tach  Medications:    Infusions: . sodium chloride    . amiodarone 30 mg/hr (08/16/15 2235)  . dextrose 5 % 1,000 mL with sodium acetate 150 mEq infusion 75 mL/hr at 08/17/15 0017  . fentaNYL infusion INTRAVENOUS 50 mcg/hr (08/16/15 0800)  . heparin 1,300 Units/hr (08/16/15 2347)  . insulin (NOVOLIN-R) infusion Stopped (08/16/15 1835)  . norepinephrine (LEVOPHED) Adult infusion 30 mcg/min (08/17/15 0636)  . vasopressin (PITRESSIN) infusion - *FOR SHOCK* 0.03 Units/min (08/16/15 1439)    Scheduled Medications: . antiseptic oral rinse  7 mL Mouth Rinse QID  . chlorhexidine gluconate (SAGE KIT)  15 mL Mouth Rinse BID  . famotidine (PEPCID) IV  20 mg Intravenous Q24H  . fentaNYL (SUBLIMAZE) injection  50 mcg Intravenous Once  . insulin aspart  0-15 Units Subcutaneous Q4H  . insulin glargine  20 Units Subcutaneous Q24H  . piperacillin-tazobactam (ZOSYN)  IV  2.25 g Intravenous Q8H  . sodium chloride flush  10-40 mL Intracatheter Q12H  . sodium chloride flush  3 mL Intravenous Q12H    Assessment/ Plan:   Principal Problem:   Atrial flutter with rapid ventricular response (HCC) Active Problems:   GERD   DM (diabetes mellitus) ([employer])   HTN (hypertension)   Hyperlipemia   Sleep apnea   Systolic CHF ([place])   Acute respiratory failure (HCC)   Septic shock (HCC)   Acute encephalopathy  1. Atrial flutter:   With RVR.  Marland Kitchen  Has converted to NSR.   Has paroxysmal episodes of atrial fib vs. Slow atrial flutter.   Is back on heparin. His hematura has cleared.   BP is better.   Will continue to wean the Levophed  2. Acute on chronic systolic CHF New Dx.   Has risk factors for CAD - needs to improve significantly before we can do an ischemia work up .   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   3. Acute on chronic renal insufficiency:  Creatinine continues to increase.  Further  management per PCCM .     Disposition:  Length of Stay: 3  Thayer Headings, Brooke Bonito., MD, [facility] 08/17/2015, 8:08 AM Office [phone] Pager  [phone]    

## 2015-08-17 NOTE — Progress Notes (Signed)
PULMONARY / CRITICAL CARE MEDICINE   Name: NIKOLIS BERENT MRN: [id number] DOB: 1950/05/28    ADMISSION DATE:  08/14/2015 CONSULTATION DATE:  08/15/2015  REFERRING MD:  Dr. Tamala Julian  CHIEF COMPLAINT:  Dizzy  SUBJECTIVE:  Remains on pressors.  Making urine.  Still febrile.  VITAL SIGNS: BP 140/62 mmHg  Pulse 89  Temp(Src) 101 F (38.3 C) (Oral)  Resp 35  Ht 5' 6"  (1.676 m)  Wt 271 lb 6.2 oz (123.1 kg)  BMI 43.82 kg/m2  SpO2 100%  HEMODYNAMICS: CVP:  [11 mmHg] 11 mmHg  VENTILATOR SETTINGS: Vent Mode:  [-] PRVC FiO2 (%):  [60 %] 60 % Set Rate:  [35 bmp] 35 bmp Vt Set:  [510 mL] 510 mL PEEP:  [10 cmH20] 10 cmH20 Plateau Pressure:  [19 cmH20-22 cmH20] 20 cmH20  INTAKE / OUTPUT: I/O last 3 completed shifts: In: 6974.7 [I.V.:5464.7; NG/GT:1260; IV Piggyback:250] Out: 2305 [Urine:2205; Emesis/NG output:100]  PHYSICAL EXAMINATION: General: sedated Neuro:  RASS -2 HEENT:  ETT in place Cardiovascular:  irregular Lungs:  No wheeze Abdomen:  Tender, decreased BS, no guarding Musculoskeletal:  1+ edema Skin:  No rashes  LABS:  BMET  Recent Labs Lab 08/15/15 1630 08/16/15 0310 08/17/15 0438  NA 131* 133* 131*  K 5.3* 5.0 4.6  CL 99* 98* 93*  CO2 15* 18* 19*  BUN 49* 58* 72*  CREATININE 4.60* 4.80* 5.32*  GLUCOSE 306* 350* 309*    Electrolytes  Recent Labs Lab 08/14/15 1501 08/14/15 2301  08/15/15 1630 08/16/15 0310 08/17/15 0438  CALCIUM 10.1 9.6  < > 7.9* 7.2* 6.5*  MG 2.1 1.8  --   --   --   --   < > = values in this interval not displayed.  CBC  Recent Labs Lab 08/15/15 1630 08/15/15 2230 08/16/15 0310 08/17/15 0438  WBC 17.7*  --  10.8* 10.6*  HGB 11.7* 11.4* 11.3* 10.4*  HCT 36.1* 34.9* 34.9* 31.2*  PLT 201  --  162 106*    Coag's  Recent Labs Lab 08/14/15 1601  INR 1.12    Sepsis Markers  Recent Labs Lab 08/14/15 2340 08/15/15 0202  LATICACIDVEN 5.1* 4.7*  PROCALCITON 1.42  --     ABG  Recent Labs Lab  08/15/15 1815 08/15/15 2018 08/17/15 0255  PHART TEST WILL BE CREDITED 7.291* 7.374  PCO2ART TEST WILL BE CREDITED 28.6* 33.1*  PO2ART TEST WILL BE CREDITED 122.0* 122*    Liver Enzymes  Recent Labs Lab 08/15/15 0422 08/17/15 0438  AST 44* 109*  ALT 24 89*  ALKPHOS 64 80  BILITOT 0.7 1.1  ALBUMIN 3.4* 1.8*    Cardiac Enzymes  Recent Labs Lab 08/15/15 1630 08/15/15 2230 08/16/15 0310  TROPONINI 0.23* 0.26* 0.28*    Glucose  Recent Labs Lab 08/16/15 1739 08/16/15 1834 08/16/15 1937 08/16/15 2349 08/17/15 0422 08/17/15 0813  GLUCAP 133* 146* 193* 255* 315* 315*    Imaging Ct Abdomen Pelvis Wo Contrast  08/16/2015  CLINICAL DATA:  65 year old male with history of abdominal distention. EXAM: CT ABDOMEN AND PELVIS WITHOUT CONTRAST TECHNIQUE: Multidetector CT imaging of the abdomen and pelvis was performed following the standard protocol without IV contrast. COMPARISON:  No priors. FINDINGS: Lower chest: Cardiomegaly. Atherosclerotic calcifications in the right coronary artery. Trace bilateral pleural effusions. Hepatobiliary: No definite cystic or solid hepatic lesions are identified within the liver on today's noncontrast CT examination. Unenhanced appearance of the gallbladder is normal. Pancreas: Atrophy in the pancreas. No definite pancreatic mass. Stranding throughout  the retroperitoneum is noted, including adjacent to the pancreas. No well-defined peripancreatic fluid collections are identified. Spleen: Unremarkable. Adrenals/Urinary Tract: Bilateral perinephric stranding (nonspecific). Unenhanced appearance of the kidneys and bilateral adrenal glands is otherwise unremarkable. No hydroureteronephrosis to indicate urinary tract obstruction at this time. Foley balloon catheter inside the urinary bladder which is nearly completely decompressed. Stomach/Bowel: Unenhanced appearance of the stomach is normal. Tip of nasogastric tube in the body of the stomach. There is  extensive colonic wall thickening in the sigmoid colon and rectal wall thickening, suggestive of a distal proctocolitis. Haziness in the mesorectum and distal sigmoid mesocolon this presumably secondary to inflammation. No pathologic dilatation of small bowel or colon. Normal appendix. Vascular/Lymphatic: Atherosclerotic calcifications throughout the abdominal and pelvic vasculature, without definite aneurysm. No lymphadenopathy noted in the abdomen or pelvis. Reproductive: Prostate gland and seminal vesicles are unremarkable in appearance. Other: Small umbilical hernia containing only omental fat. Trace volume of ascites. No pneumoperitoneum. Mild diffuse body wall edema. Musculoskeletal: There are no aggressive appearing lytic or blastic lesions noted in the visualized portions of the skeleton. IMPRESSION: 1. Thickening of the sigmoid colon and rectal wall with surrounding inflammatory changes in the adjacent mesocolon/mesorectum, concerning for distal proctocolitis. 2. Trace bilateral pleural effusions, trace volume of ascites, retroperitoneal edema and diffuse mild body wall edema suggestive of a state of mild anasarca. 3. Atherosclerosis, including right coronary artery disease. Please note that although the presence of coronary artery calcium documents the presence of coronary artery disease, the severity of this disease and any potential stenosis cannot be assessed on this non-gated CT examination. Assessment for potential risk factor modification, dietary therapy or pharmacologic therapy may be warranted, if clinically indicated. 4. Cardiomegaly. Electronically Signed   By: Vinnie Langton M.D.   On: 08/16/2015 13:45   Dg Chest Port 1 View  08/17/2015  CLINICAL DATA:  Respiratory failure. EXAM: PORTABLE CHEST 1 VIEW COMPARISON:  08/15/2015. FINDINGS: Endotracheal tube 3 cm above the carina on today's exam. NG tube, right IJ line stable position. Heart size normal. Right mid lung and bibasilar  subsegmental atelectasis. No pleural effusion or pneumothorax. IMPRESSION: 1. Endotracheal tube 3 cm above the carina on today's exam. NG tube and right IJ line stable position. 2. Right mid lung and bibasilar subsegmental atelectasis. Electronically Signed   By: Marcello Moores  Register   On: 08/17/2015 06:56     STUDIES:  6/12 Echo >> EF 25% 6/13 Renal u/s >> mild Rt renal atrophy 6/14 CT abd/pelvis >> distal proctocolitis  CULTURES: 6/12 Blood >> GNR 6/12 Urine >> Citrobacter koseri  ANTIBIOTICS: 6/12 Vancomycin >> 6/14 6/12 Zosyn >> 6/13 Cefepime >> 6/14  SIGNIFICANT EVENTS: 6/12 Admit to [facility] 6/13 Transfer to [facility], cardiology/urology consulted  LINES/TUBES: 6/13 Rt femoral CVL >> 6/13 6/13 ETT >> 6/13 Rt IJ CVL >> 6/13 Lt radial aline >>  DISCUSSION: 65 yo male presented to [facility] with dizziness, tachycardia and leg edema.  Found to have A flutter and EF 25%.  ASSESSMENT / PLAN:  PULMONARY A: Acute hypoxic respiratory failure. Hx of OSA, COPD. P:   Full vent support F/u CXR, ABG Prn BDs  CARDIOVASCULAR A:  Septic shock. Chronic systolic CHF. A fib/flutter with RVR. Hx of HTN. P:  Wean pressors to keep MAP > 65 Goal CVP 8 to 12 Continue amiodarone, heparin gtt  RENAL A:   AKI. CKD stage III >> baseline creatinine 2.4. Urethral false passage s/p cystoscopy by urology 6/13. P:   Monitor renal fx, urine outpt >>  creatinine increasing, but still making urine >> defer nephrology consultation for now D/c sodium acetate from IV fluid  GASTROINTESTINAL A:   Colitis. P:   NPO Pepcid for SUP Might need surgical assessment  HEMATOLOGIC A:   Anemia, thrombocytopenia of critical illness. P:  F/u CBC  INFECTIOUS A:   Septic shock from UTI and colitis. P:   Day 4 of Abx, currently on zosyn  ENDOCRINE A:   DM type II. P:   SSI  NEUROLOGIC A:   Acute metabolic encephalopathy. P:   RASS goal: -1  No family at bedside.  CC time 34 minutes.  Chesley Mires, MD [facility] Pulmonary/Critical Care 08/17/2015, 9:14 AM Pager:  [phone] After 3pm call: [phone]

## 2015-08-17 NOTE — Progress Notes (Signed)
Pt HR sustaining in the 150-160s O2 Sats 84-90% and BP lower with increased HR. Notified eLink MD. New orders placed and initiated. Will continue to assess and monitor the pt closely.

## 2015-08-17 NOTE — Progress Notes (Signed)
[facility] Progress Note Patient Name: Frank Hoover DOB: 16-Jan-1951 MRN: [id number]   Date of Service  08/17/2015  HPI/Events of Note  Loose stool  eICU Interventions  Rectal tube     Intervention Category Minor Interventions: Routine modifications to care plan (e.g. PRN medications for pain, fever)  Simonne Maffucci 08/17/2015, 8:48 PM

## 2015-08-17 NOTE — Progress Notes (Signed)
CRITICAL VALUE ALERT  Critical value received: Lactic Acid 2.6  Date of notification:  08/17/15  Time of notification:  [id number]  Critical value read back:Yes.    Nurse who received alert: Newman Nickels RN  MD notified (1st page):  Dr. Lake Bells  Time of first page: 1714

## 2015-08-17 NOTE — Progress Notes (Signed)
[facility] Progress Note Patient Name: Frank Hoover DOB: [birth year] MRN: [id number]   Date of Service  08/17/2015  HPI/Events of Note  hypocalcemia  eICU Interventions  repleted     Intervention Category Intermediate Interventions: Electrolyte abnormality - evaluation and management  Simonne Maffucci 08/17/2015, 5:15 PM

## 2015-08-17 NOTE — Progress Notes (Signed)
[facility] Progress Note Patient Name: Frank Hoover DOB: Oct 03, 1950 MRN: [id number]   Date of Service  08/17/2015  HPI/Events of Note  Afib with RVR, worsening hypoxemia Afib correllating with levophed use per RN Likely cardiogenic shock Edematous on exam CVP 11  eICU Interventions  Lasix x1 dose Change MAP goal to 55 Check lactic acid Check coox Amiodarone bolus again     Intervention Category Major Interventions: Arrhythmia - evaluation and management;Respiratory failure - evaluation and management;Shock - evaluation and management  Simonne Maffucci 08/17/2015, 4:16 PM

## 2015-08-17 NOTE — Progress Notes (Signed)
Pt HR sustaining 130-140. MD notified orders placed for 150 mg bolus of IV amiodarone. Will give bolus and continue to assess and monitor the pt closely.

## 2015-08-17 NOTE — Progress Notes (Signed)
eLink Physician-Brief Progress Note Patient Name: Frank Hoover DOB: 1950/05/17 MRN: [id number]             Date of Service  08/17/2015  HPI/Events of Note  Hypotension, afib with RVR again and hypoxemia, vent dyssynchrony Received metoprolol per cardiology earlier today Earlier in his hospitalization diltiazem made him severely bradycardic (per RN report)  eICU Interventions  Add  precedex Continue amiodarone     Intervention Category Major Interventions: Hypertension - evaluation and management;Delirium, psychosis, severe agitation - evaluation and management  Simonne Maffucci 08/17/2015, 7:30 PM

## 2015-08-18 ENCOUNTER — Inpatient Hospital Stay (HOSPITAL_COMMUNITY): Payer: Medicare Other

## 2015-08-18 DIAGNOSIS — N179 Acute kidney failure, unspecified: Secondary | ICD-10-CM | POA: Diagnosis present

## 2015-08-18 LAB — BLOOD GAS, ARTERIAL
Acid-base deficit: 0 mmol/L (ref 0.0–2.0)
Bicarbonate: 24.1 mEq/L — ABNORMAL HIGH (ref 20.0–24.0)
Drawn by: 29017
FIO2: 1
MECHVT: 510 mL
O2 Saturation: 98.5 %
PEEP: 5 cmH2O
Patient temperature: 98.7
RATE: 16 resp/min
TCO2: 25.3 mmol/L (ref 0–100)
pCO2 arterial: 39.4 mmHg (ref 35.0–45.0)
pH, Arterial: 7.404 (ref 7.350–7.450)
pO2, Arterial: 153 mmHg — ABNORMAL HIGH (ref 80.0–100.0)

## 2015-08-18 LAB — BASIC METABOLIC PANEL
Anion gap: 12 (ref 5–15)
BUN: 77 mg/dL — ABNORMAL HIGH (ref 6–20)
CO2: 24 mmol/L (ref 22–32)
Calcium: 6.7 mg/dL — ABNORMAL LOW (ref 8.9–10.3)
Chloride: 98 mmol/L — ABNORMAL LOW (ref 101–111)
Creatinine, Ser: 5.37 mg/dL — ABNORMAL HIGH (ref 0.61–1.24)
GFR calc Af Amer: 12 mL/min — ABNORMAL LOW (ref >60)
GFR calc non Af Amer: 10 mL/min — ABNORMAL LOW (ref >60)
Glucose, Bld: 140 mg/dL — ABNORMAL HIGH (ref 65–99)
Potassium: 3.8 mmol/L (ref 3.5–5.1)
Sodium: 134 mmol/L — ABNORMAL LOW (ref 135–145)

## 2015-08-18 LAB — CBC
HCT: 27.2 % — ABNORMAL LOW (ref 39.0–52.0)
Hemoglobin: 9.1 g/dL — ABNORMAL LOW (ref 13.0–17.0)
MCH: 28.6 pg (ref 26.0–34.0)
MCHC: 33.5 g/dL (ref 30.0–36.0)
MCV: 85.5 fL (ref 78.0–100.0)
Platelets: 70 10*3/uL — ABNORMAL LOW (ref 150–400)
RBC: 3.18 MIL/uL — ABNORMAL LOW (ref 4.22–5.81)
RDW: 15.1 % (ref 11.5–15.5)
WBC: 6.6 10*3/uL (ref 4.0–10.5)

## 2015-08-18 LAB — RENAL FUNCTION PANEL
Albumin: 1.5 g/dL — ABNORMAL LOW (ref 3.5–5.0)
Anion gap: 12 (ref 5–15)
BUN: 79 mg/dL — ABNORMAL HIGH (ref 6–20)
CO2: 25 mmol/L (ref 22–32)
Calcium: 6.8 mg/dL — ABNORMAL LOW (ref 8.9–10.3)
Chloride: 97 mmol/L — ABNORMAL LOW (ref 101–111)
Creatinine, Ser: 5.34 mg/dL — ABNORMAL HIGH (ref 0.61–1.24)
GFR calc Af Amer: 12 mL/min — ABNORMAL LOW (ref >60)
GFR calc non Af Amer: 10 mL/min — ABNORMAL LOW (ref >60)
Glucose, Bld: 132 mg/dL — ABNORMAL HIGH (ref 65–99)
Phosphorus: 5.7 mg/dL — ABNORMAL HIGH (ref 2.5–4.6)
Potassium: 3.5 mmol/L (ref 3.5–5.1)
Sodium: 134 mmol/L — ABNORMAL LOW (ref 135–145)

## 2015-08-18 LAB — GLUCOSE, CAPILLARY
Glucose-Capillary: 113 mg/dL — ABNORMAL HIGH (ref 65–99)
Glucose-Capillary: 123 mg/dL — ABNORMAL HIGH (ref 65–99)
Glucose-Capillary: 160 mg/dL — ABNORMAL HIGH (ref 65–99)
Glucose-Capillary: 133 mg/dL — ABNORMAL HIGH (ref 65–99)
Glucose-Capillary: 165 mg/dL — ABNORMAL HIGH (ref 65–99)
Glucose-Capillary: 139 mg/dL — ABNORMAL HIGH (ref 65–99)
Glucose-Capillary: 140 mg/dL — ABNORMAL HIGH (ref 65–99)

## 2015-08-18 LAB — HEPARIN LEVEL (UNFRACTIONATED): Heparin Unfractionated: 0.45 IU/mL (ref 0.30–0.70)

## 2015-08-18 MED ORDER — METOPROLOL TARTRATE 50 MG PO TABS
50.0000 mg | ORAL_TABLET | Freq: Two times a day (BID) | ORAL | Status: DC
  Administered 2015-08-18 – 2015-08-19 (×2): 50 mg
  Filled 2015-08-18 (×2): qty 1

## 2015-08-18 MED ORDER — AMIODARONE HCL IN DEXTROSE 360-4.14 MG/200ML-% IV SOLN: 30.0000 mg/h | INTRAVENOUS | Status: DC

## 2015-08-18 MED ORDER — AMIODARONE HCL IN DEXTROSE 360-4.14 MG/200ML-% IV SOLN: 60.0000 mg/h | INTRAVENOUS | Status: DC

## 2015-08-18 MED ORDER — DEXTROSE 5 % IV SOLN
2.0000 g | INTRAVENOUS | Status: DC
  Administered 2015-08-18 – 2015-08-19 (×2): 2 g via INTRAVENOUS
  Filled 2015-08-18 (×2): qty 2

## 2015-08-18 MED ORDER — METOPROLOL TARTRATE 25 MG PO TABS
25.0000 mg | ORAL_TABLET | Freq: Two times a day (BID) | ORAL | Status: DC
  Administered 2015-08-18: 25 mg
  Filled 2015-08-18: qty 1

## 2015-08-18 MED ORDER — ANTISEPTIC ORAL RINSE SOLUTION (CORINZ)
7.0000 mL | OROMUCOSAL | Status: DC
  Administered 2015-08-19 – 2015-08-21 (×22): 7 mL via OROMUCOSAL

## 2015-08-18 MED ORDER — AMIODARONE HCL IN DEXTROSE 360-4.14 MG/200ML-% IV SOLN
60.0000 mg/h | INTRAVENOUS | Status: DC
  Administered 2015-08-18: 30 mg/h via INTRAVENOUS
  Administered 2015-08-19: 60 mg/h via INTRAVENOUS
  Administered 2015-08-19: 30 mg/h via INTRAVENOUS
  Administered 2015-08-19: 60 mg/h via INTRAVENOUS
  Administered 2015-08-19: 30 mg/h via INTRAVENOUS
  Administered 2015-08-20 – 2015-08-22 (×7): 60 mg/h via INTRAVENOUS
  Filled 2015-08-18 (×14): qty 200

## 2015-08-18 MED ORDER — METOPROLOL TARTRATE 5 MG/5ML IV SOLN
5.0000 mg | Freq: Once | INTRAVENOUS | Status: AC
  Administered 2015-08-18: 5 mg via INTRAVENOUS
  Filled 2015-08-18: qty 5

## 2015-08-18 NOTE — Progress Notes (Signed)
Repeat cbg 123. Will continue to monitor.

## 2015-08-18 NOTE — Progress Notes (Signed)
CBG 69- 1/2 amp D50 given IV. Dr. Jimmy Footman notified. Will recheck in 15 mins.

## 2015-08-18 NOTE — Progress Notes (Addendum)
ANTICOAGULATION CONSULT NOTE   Pharmacy Consult for heparin Indication: atrial fibrillation  No Known Allergies  Patient Measurements: Height: 5\' 6"  (167.6 cm) Weight: 271 lb 6.2 oz (123.1 kg) IBW/kg (Calculated) : 63.8 HEPARIN DW (KG): 91.8  Vital Signs: Temp: 98.1 F (36.7 C) (06/16 0408) Temp Source: Oral (06/16 0408) Pulse Rate: 95 (06/16 0659)  Labs:  Recent Labs  08/15/15 1630 08/15/15 2230 08/16/15 0310 08/16/15 1808 08/17/15 0438 08/17/15 0439 08/17/15 1630 08/18/15 0515  HGB 11.7* 11.4* 11.3*  --  10.4*  --   --  9.1*  HCT 36.1* 34.9* 34.9*  --  31.2*  --   --  27.2*  PLT 201  --  162  --  106*  --   --  70*  HEPARINUNFRC  --   --   --  0.43  --  0.57  --  0.45  CREATININE 4.60*  --  4.80*  --  5.32*  --  5.24* 5.37*  TROPONINI 0.23* 0.26* 0.28*  --   --   --   --   --     Estimated Creatinine Clearance: 17.4 mL/min (by C-G formula based on Cr of 5.37).   Medical History: Past Medical History  Diagnosis Date  . Gout   . COPD (chronic obstructive pulmonary disease) ([place])   . Type 2 diabetes mellitus ([place])   . GERD (gastroesophageal reflux disease)   . Essential hypertension   . Hyperlipemia   . OA (osteoarthritis)   . Gastroparesis   . Adenomatous colon polyp 10/30/09    Serrated adenoma removed during Colonoscopy   . Diverticulosis of colon   . CKD (chronic kidney disease) stage 3, GFR 30-59 ml/min     Medications:  See EMR  Assessment: Newly diagnosed atrial flutter with RVR. Heparin was dc'd on 6/13 due to frank blood from urethra likely due to traumatic foley insertion > urethra bleeding resolved > heparin was resumed. AoCKD, eCrCl ~ 20 ml/min. Heparin level is therapeutic. hgb is stable, plts continue to trend down. Could be due to acute illness, monitor.   Goal of Therapy:  Heparin level 0.3-0.7 units/ml Monitor platelets by anticoagulation protocol: Yes    Plan:  -Continue heparin at 1300 units/hr -Daily HL, CBC    Thanks for  allowing pharmacy to be a part of this patient's care.  Hughes Better, PharmD, BCPS Clinical Pharmacist Pager: [phone] 08/18/2015 8:16 AM     Addendum -Stop heparin -Watch plts, f/u HIT panel -Stop zosyn, start ceftazidime for citrobacter UTI + bacteremia -Ceftazidime 2g/24h -Monitor renal fx, cultures, duration of therapy   [name]  08/18/2015 10:45 AM

## 2015-08-18 NOTE — Progress Notes (Signed)
Primary cardiologist :  Domenic Polite   PROGRESS NOTE  Subjective:   65 y.o.male with past medical history outlined below, referred today by his nephrologist for an echocardiogram secondary to elevated heart rate and leg edema. He then was referred from the cardiovascular lab to the ER after call placed to Dr. Lowanda Foster reporting patient heart rate in the 120s and associated dizziness. He has been found to be in atrial flutter with RVR of uncertain duration, and has evidence of a cardiomyopathy with LVEF approximately 25%.  He developed respiratory distress, temperature of 104 axillary, worsening tachycardia .  He was seen up at [facility] by Dr. Domenic Polite and was transferred to [facility] for further management.    Is on the vent. Has acute nonoliguric  renal failure - Cr. = 5.37 today  He A-fib with RVR much of yesterday .  Received several Amio boluses Was started on metoprolol PRN ( not sure if he received it) Was started on Precidex for HR control by PCCM Was then started on Levophed for BP support     Objective:    Vital Signs:   Temp:  [98.1 F (36.7 C)-100.6 F (38.1 C)] 98.1 F (36.7 C) (06/16 0408) Pulse Rate:  [89-95] 95 (06/16 0659) Resp:  [13-32] 26 (06/16 0659) SpO2:  [84 %-100 %] 100 % (06/16 0659) Arterial Line BP: (84-149)/(47-67) 94/56 mmHg (06/16 0600) FiO2 (%):  [40 %-100 %] 70 % (06/16 0659)  Last BM Date: 08/17/15   24-hour weight change: Weight change:   Weight trends: Filed Weights   08/15/15 1450 08/16/15 0500 08/17/15 0407  Weight: 264 lb 8.8 oz (120 kg) 268 lb 15.4 oz (122 kg) 271 lb 6.2 oz (123.1 kg)    Intake/Output:  06/15 0701 - 06/16 0700 In: 3003.6 [I.V.:2693.6; IV Piggyback:310] Out: 627 [Urine:700; Emesis/NG output:75]     Physical Exam: BP 140/62 mmHg  Pulse 95  Temp(Src) 98.1 F (36.7 C) (Oral)  Resp 26  Ht _0  (1.676 m)  Wt 271 lb 6.2 oz (123.1 kg)  BMI 43.82 kg/m2  SpO2 100%  Wt Readings from Last 3 Encounters:    08/17/15 271 lb 6.2 oz (123.1 kg)  06/16/15 262 lb (118.842 kg)  06/22/14 265 lb (120.203 kg)    General: Vital signs reviewed and noted.  Intubated, sedated,   Head: ETT in place.   Eyes: conjunctivae/corneas clear.  EOM's intact.   Throat: normal  Neck:  normal   Lungs:    clear , on the vent   Heart:  Irreg.    Abdomen:  Soft, non-tender, non-distended    Extremities: 1+ edema    Neurologic: Sedated, on the vent   Psych:  NA    Labs: BMET:  Recent Labs  08/17/15 1630 08/18/15 0515  NA 133* 134*  K 3.5 3.8  CL 95* 98*  CO2 24 24  GLUCOSE 182* 140*  BUN 75* 77*  CREATININE 5.24* 5.37*  CALCIUM 6.4* 6.7*    Liver function tests:  Recent Labs  08/17/15 0438  AST 109*  ALT 89*  ALKPHOS 80  BILITOT 1.1  PROT 4.5*  ALBUMIN 1.8*   No results for input(s): LIPASE, AMYLASE in the last 72 hours.  CBC:  Recent Labs  08/15/15 1630  08/17/15 0438 08/18/15 0515  WBC 17.7*  < > 10.6* 6.6  NEUTROABS 15.4*  --   --   --   HGB 11.7*  < > 10.4* 9.1*  HCT 36.1*  < >  31.2* 27.2*  MCV 90.5  < > 86.2 85.5  PLT 201  < > 106* 70*  < > = values in this interval not displayed.  Cardiac Enzymes:  Recent Labs  08/15/15 1630 08/15/15 2230 08/16/15 0310  TROPONINI 0.23* 0.26* 0.28*    Coagulation Studies: No results for input(s): LABPROT, INR in the last 72 hours.  Other: Invalid input(s): POCBNP No results for input(s): DDIMER in the last 72 hours. No results for input(s): HGBA1C in the last 72 hours. No results for input(s): CHOL, HDL, LDLCALC, TRIG, CHOLHDL in the last 72 hours. No results for input(s): TSH, T4TOTAL, T3FREE, THYROIDAB in the last 72 hours.  Invalid input(s): FREET3 No results for input(s): VITAMINB12, FOLATE, FERRITIN, TIBC, IRON, RETICCTPCT in the last 72 hours.  Urine is bloody   Other results:  EKG  ( personally reviewed )  - NSR at 90.  Frequent episodes of atrial fib.  Ventricular rate is better     Medications:     Infusions: . sodium chloride 75 mL/hr at 08/18/15 0326  . amiodarone 30 mg/hr (08/17/15 2000)  . dexmedetomidine 0.4 mcg/kg/hr (08/18/15 0416)  . fentaNYL infusion INTRAVENOUS 50 mcg/hr (08/18/15 0058)  . heparin 1,300 Units/hr (08/17/15 2032)  . norepinephrine (LEVOPHED) Adult infusion 4 mcg/min (08/18/15 0500)  . vasopressin (PITRESSIN) infusion - *FOR SHOCK* Stopped (08/17/15 1803)    Scheduled Medications: . antiseptic oral rinse  7 mL Mouth Rinse QID  . chlorhexidine gluconate (SAGE KIT)  15 mL Mouth Rinse BID  . famotidine (PEPCID) IV  20 mg Intravenous Q24H  . insulin aspart  0-15 Units Subcutaneous Q4H  . insulin glargine  20 Units Subcutaneous Daily  . piperacillin-tazobactam (ZOSYN)  IV  2.25 g Intravenous Q8H    Assessment/ Plan:   Principal Problem:   Atrial flutter with rapid ventricular response (HCC) Active Problems:   GERD   DM (diabetes mellitus) ([place])   HTN (hypertension)   Hyperlipemia   Sleep apnea   Systolic CHF ([place])   Acute respiratory failure ([place])   Septic shock ([place])   Acute encephalopathy   Acute kidney injury ([place])  1. Atrial flutter:   With RVR.  Marland Kitchen  Has converted to NSR.   Has paroxysmal episodes of atrial fib vs.  I would favor stopping Precidex and then hopefully stopping the Levophed He is on Fentanyl and should not necessarily need the Precidex for sedation .   Will see what PCCM thinks   BP is better.   Will continue to wean the Levophed  2. Acute on chronic systolic CHF New Dx.   Has risk factors for CAD - needs to improve significantly before we can do an ischemia work up .   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   3. Acute on chronic renal insufficiency:  Creatinine continues to increase.  Further management per PCCM .     Disposition:  Length of Stay: 4  Thayer Headings, Brooke Bonito., MD, [facility] 08/18/2015, 8:03 AM Office [phone] Pager  [phone]

## 2015-08-18 NOTE — Care Management Important Message (Signed)
Important Message  Patient Details  Name: CASHUS WAGY MRN: [id number] Date of Birth: Aug 01, 1950   Medicare Important Message Given:  Yes    Loann Quill 08/18/2015, 9:17 AM

## 2015-08-18 NOTE — Progress Notes (Signed)
Initial Nutrition Assessment  DOCUMENTATION CODES:   Morbid obesity  INTERVENTION:   Once ready to initiate tube feedings recommend Vital High Protein which can be started at 20 ml/hr and increased by 10 ml every 4-8 hours to goal of 50 ml/hr 60 ml Prostat BID Provides: 1600 kcal, 165 grams protein, and 1003 ml H2O.    NUTRITION DIAGNOSIS:   Inadequate oral intake related to inability to eat as evidenced by NPO status.  GOAL:   Provide needs based on ASPEN/SCCM guidelines  MONITOR:   I & O's, Vent status, TF tolerance  REASON FOR ASSESSMENT:   Ventilator    ASSESSMENT:   65 y/o M with admitted 6/12 to [facility] for atrial flutter, concern for UTI / sepsis. Decompensated 6/13 requiring vasopressors and intubation. 6/14 CT abd/pelvis >> distal proctocolitis  6/13 intubated Patient is currently intubated on ventilator support MV: 14.1 L/min Temp (24hrs), Avg:98.9 F (37.2 C), Min:98.1 F (36.7 C), Max:100 F (37.8 C)  Medications reviewed and include: lantus Labs reviewed: Na 134 CBG's: 133-160 Nutrition-Focused physical exam completed. Findings are no fat depletion, no muscle depletion, and severe edema.  OG tube, tip in body of stomach   Diet Order:  Diet NPO time specified  Skin:  Reviewed, no issues  Last BM:  6/15 x 3, rectal tube inserted  Height:   Ht Readings from Last 1 Encounters:  08/15/15 5\' 6"  (1.676 m)    Weight:   Wt Readings from Last 1 Encounters:  08/17/15 271 lb 6.2 oz (123.1 kg)    Ideal Body Weight:  64.5 kg  BMI:  Body mass index is 43.82 kg/(m^2).  Estimated Nutritional Needs:   Kcal:  [id number]  Protein:  161 grams  Fluid:  > 1.5 L/day  EDUCATION NEEDS:   No education needs identified at this time  [place], [place], [place] Pager [phone] After Hours Pager

## 2015-08-18 NOTE — Progress Notes (Signed)
Pharmacist Heart Failure Core Measure Documentation  Assessment: Frank Hoover has an EF documented as 25% on 08/14/15.  Rationale: Heart failure patients with left ventricular systolic dysfunction (LVSD) and an EF < 40% should be prescribed an angiotensin converting enzyme inhibitor (ACEI) or angiotensin receptor blocker (ARB) at discharge unless a contraindication is documented in the medical record.  This patient is not currently on an ACEI or ARB for HF.  This note is being placed in the record in order to provide documentation that a contraindication to the use of these agents is present for this encounter.  ACE Inhibitor or Angiotensin Receptor Blocker is contraindicated (specify all that apply)  []   ACEI allergy AND ARB allergy []   Angioedema []   Moderate or severe aortic stenosis []   Hyperkalemia []   Hypotension []   Renal artery stenosis [x]   Worsening renal function, preexisting renal disease or dysfunction   [name] 08/18/2015 3:39 PM

## 2015-08-18 NOTE — Progress Notes (Signed)
PULMONARY / CRITICAL CARE MEDICINE   Name: Frank Hoover MRN: [id number] DOB: 04/28/1950    ADMISSION DATE:  08/14/2015 CONSULTATION DATE:  08/15/2015  REFERRING MD:  Dr. Tamala Julian  CHIEF COMPLAINT:  Dizzy  SUBJECTIVE:  No additional fever.  VITAL SIGNS: BP 140/62 mmHg  Pulse 95  Temp(Src) 99.6 F (37.6 C) (Axillary)  Resp 18  Ht 5' 6"  (1.676 m)  Wt 271 lb 6.2 oz (123.1 kg)  BMI 43.82 kg/m2  SpO2 99%  HEMODYNAMICS: CVP:  [8 mmHg-11 mmHg] 8 mmHg  VENTILATOR SETTINGS: Vent Mode:  [-] PRVC FiO2 (%):  [40 %-100 %] 70 % Set Rate:  [16 bmp] 16 bmp Vt Set:  [510 mL] 510 mL PEEP:  [5 cmH20-8 cmH20] 5 cmH20 Plateau Pressure:  [10 BMW41-32 cmH20] 21 cmH20  INTAKE / OUTPUT: I/O last 3 completed shifts: In: 4936.6 [I.V.:4476.6; NG/GT:100; IV Piggyback:360] Out: 4401 [Urine:1495; Emesis/NG output:75]  PHYSICAL EXAMINATION: General: sedated Neuro:  RASS -2 HEENT:  ETT in place Cardiovascular:  regular Lungs:  No wheeze Abdomen:  Less tender, decreased BS, no guarding Musculoskeletal:  1+ edema Skin:  No rashes  LABS:  BMET  Recent Labs Lab 08/17/15 0438 08/17/15 1630 08/18/15 0515  NA 131* 133* 134*  K 4.6 3.5 3.8  CL 93* 95* 98*  CO2 19* 24 24  BUN 72* 75* 77*  CREATININE 5.32* 5.24* 5.37*  GLUCOSE 309* 182* 140*    Electrolytes  Recent Labs Lab 08/14/15 1501 08/14/15 2301  08/17/15 0438 08/17/15 1630 08/18/15 0515  CALCIUM 10.1 9.6  < > 6.5* 6.4* 6.7*  MG 2.1 1.8  --   --   --   --   < > = values in this interval not displayed.  CBC  Recent Labs Lab 08/16/15 0310 08/17/15 0438 08/18/15 0515  WBC 10.8* 10.6* 6.6  HGB 11.3* 10.4* 9.1*  HCT 34.9* 31.2* 27.2*  PLT 162 106* 70*    Coag's  Recent Labs Lab 08/14/15 1601  INR 1.12    Sepsis Markers  Recent Labs Lab 08/14/15 2340 08/15/15 0202 08/17/15 1630 08/17/15 2027  LATICACIDVEN 5.1* 4.7* 2.6* 2.2*  PROCALCITON 1.42  --   --   --     ABG  Recent Labs Lab  08/17/15 0255 08/17/15 1631 08/18/15 0319  PHART 7.374 7.388 7.404  PCO2ART 33.1* 40.5 39.4  PO2ART 122* 61.0* 153*    Liver Enzymes  Recent Labs Lab 08/15/15 0422 08/17/15 0438  AST 44* 109*  ALT 24 89*  ALKPHOS 64 80  BILITOT 0.7 1.1  ALBUMIN 3.4* 1.8*    Cardiac Enzymes  Recent Labs Lab 08/15/15 1630 08/15/15 2230 08/16/15 0310  TROPONINI 0.23* 0.26* 0.28*    Glucose  Recent Labs Lab 08/17/15 1556 08/17/15 2017 08/17/15 2350 08/18/15 0030 08/18/15 0424 08/18/15 0841  GLUCAP 193* 113* 69 123* 160* 133*    Imaging Dg Chest Port 1 View  08/18/2015  CLINICAL DATA:  Respiratory failure EXAM: PORTABLE CHEST 1 VIEW COMPARISON:  08/17/2015 FINDINGS: Cardiac shadow is stable. An endotracheal tube and nasogastric catheter are noted in stable position. A right jugular central line is again seen. Bibasilar atelectatic changes are noted worse on the right than the left. These have increased in the interval from the prior exam. IMPRESSION: Increasing bibasilar atelectatic changes. Tubes and lines as described. Electronically Signed   By: Inez Catalina M.D.   On: 08/18/2015 06:43     STUDIES:  6/12 Echo >> EF 25% 6/13 Renal u/s >> mild  Rt renal atrophy 6/14 CT abd/pelvis >> distal proctocolitis  CULTURES: 6/12 Blood >> Citrobacter koseri 6/12 Urine >> Citrobacter koseri  ANTIBIOTICS: 6/12 Vancomycin >> 6/14 6/12 Zosyn >> 6/16 6/13 Cefepime >> 6/14 6/16 Fortaz >>   SIGNIFICANT EVENTS: 6/12 Admit to [facility] 6/13 Transfer to [facility], cardiology/urology consulted  LINES/TUBES: 6/13 Rt femoral CVL >> 6/13 6/13 ETT >> 6/13 Rt IJ CVL >> 6/13 Lt radial aline >>  DISCUSSION: 65 yo male presented to [facility] with dizziness, tachycardia and leg edema.  Found to have A flutter and EF 25%.  ASSESSMENT / PLAN:  PULMONARY A: Acute hypoxic respiratory failure. Hx of OSA, COPD. P:   Full vent support F/u CXR, ABG Prn BDs  CARDIOVASCULAR A:  Septic shock. Chronic  systolic CHF. A fib/flutter with RVR >> back in sinus rhythm. Hx of HTN. P:  Wean pressors to keep MAP > 65 Goal CVP 8 to 12 Amiodarone per cardiology Hold heparin in setting of thrombocytopenia  RENAL A:   AKI. CKD stage III >> baseline creatinine 2.4. Urethral false passage s/p cystoscopy by urology 6/13. P:   Monitor renal fx, urine outpt >> creatinine increasing, but might be reaching plateau and still making urine >> defer nephrology consultation for now  GASTROINTESTINAL A:   Colitis >> abdominal exam improved 6/16. P:   NPO Pepcid for SUP  HEMATOLOGIC A:   Anemia of critical illness. Thrombocytopenia. P:  F/u CBC Hold heparin Check HIT Ab 6/16 SCDs  INFECTIOUS A:   Septic shock from UTI and colitis >> Citrobacter in urine, blood. P:   Day 5 of Abx, change to fortaz  ENDOCRINE A:   DM type II. P:   SSI  NEUROLOGIC A:   Acute metabolic encephalopathy. P:   RASS goal: -1  No family at bedside.  CC time 34 minutes.  Chesley Mires, MD [facility] Pulmonary/Critical Care 08/18/2015, 10:03 AM Pager:  [phone] After 3pm call: [phone]

## 2015-08-19 ENCOUNTER — Inpatient Hospital Stay (HOSPITAL_COMMUNITY): Payer: Medicare Other

## 2015-08-19 DIAGNOSIS — I5021 Acute systolic (congestive) heart failure: Secondary | ICD-10-CM

## 2015-08-19 LAB — GLUCOSE, CAPILLARY
Glucose-Capillary: 147 mg/dL — ABNORMAL HIGH (ref 65–99)
Glucose-Capillary: 149 mg/dL — ABNORMAL HIGH (ref 65–99)
Glucose-Capillary: 161 mg/dL — ABNORMAL HIGH (ref 65–99)
Glucose-Capillary: 175 mg/dL — ABNORMAL HIGH (ref 65–99)
Glucose-Capillary: 180 mg/dL — ABNORMAL HIGH (ref 65–99)
Glucose-Capillary: 204 mg/dL — ABNORMAL HIGH (ref 65–99)

## 2015-08-19 LAB — CBC
HCT: 28.4 % — ABNORMAL LOW (ref 39.0–52.0)
Hemoglobin: 9.3 g/dL — ABNORMAL LOW (ref 13.0–17.0)
MCH: 27.8 pg (ref 26.0–34.0)
MCHC: 32.7 g/dL (ref 30.0–36.0)
MCV: 85 fL (ref 78.0–100.0)
Platelets: 66 10*3/uL — ABNORMAL LOW (ref 150–400)
RBC: 3.34 MIL/uL — ABNORMAL LOW (ref 4.22–5.81)
RDW: 15.2 % (ref 11.5–15.5)
WBC: 5.8 10*3/uL (ref 4.0–10.5)

## 2015-08-19 LAB — BASIC METABOLIC PANEL
Anion gap: 14 (ref 5–15)
BUN: 79 mg/dL — ABNORMAL HIGH (ref 6–20)
CO2: 24 mmol/L (ref 22–32)
Calcium: 7.4 mg/dL — ABNORMAL LOW (ref 8.9–10.3)
Chloride: 98 mmol/L — ABNORMAL LOW (ref 101–111)
Creatinine, Ser: 4.66 mg/dL — ABNORMAL HIGH (ref 0.61–1.24)
GFR calc Af Amer: 14 mL/min — ABNORMAL LOW (ref >60)
GFR calc non Af Amer: 12 mL/min — ABNORMAL LOW (ref >60)
Glucose, Bld: 150 mg/dL — ABNORMAL HIGH (ref 65–99)
Potassium: 3.3 mmol/L — ABNORMAL LOW (ref 3.5–5.1)
Sodium: 136 mmol/L (ref 135–145)

## 2015-08-19 LAB — CULTURE, BLOOD (ROUTINE X 2): Culture: NO GROWTH

## 2015-08-19 MED ORDER — PRO-STAT SUGAR FREE PO LIQD
60.0000 mL | Freq: Two times a day (BID) | ORAL | Status: DC
  Administered 2015-08-19 – 2015-08-20 (×2): 60 mL
  Filled 2015-08-19 (×2): qty 60

## 2015-08-19 MED ORDER — FUROSEMIDE 10 MG/ML IJ SOLN
80.0000 mg | Freq: Once | INTRAMUSCULAR | Status: AC
  Administered 2015-08-19: 80 mg via INTRAVENOUS
  Filled 2015-08-19: qty 8

## 2015-08-19 MED ORDER — METOPROLOL TARTRATE 25 MG/10 ML ORAL SUSPENSION
25.0000 mg | Freq: Once | ORAL | Status: AC
  Administered 2015-08-19: 25 mg
  Filled 2015-08-19: qty 10

## 2015-08-19 MED ORDER — CEFTRIAXONE SODIUM 2 G IJ SOLR
2.0000 g | INTRAMUSCULAR | Status: DC
  Administered 2015-08-19 – 2015-08-21 (×3): 2 g via INTRAVENOUS
  Filled 2015-08-19 (×5): qty 2

## 2015-08-19 MED ORDER — METOPROLOL TARTRATE 50 MG PO TABS: 75.0000 mg | ORAL_TABLET | Freq: Two times a day (BID) | ORAL | Status: DC

## 2015-08-19 MED ORDER — HYDRALAZINE HCL 20 MG/ML IJ SOLN
10.0000 mg | INTRAMUSCULAR | Status: DC | PRN
  Administered 2015-08-20 – 2015-09-06 (×5): 10 mg via INTRAVENOUS
  Filled 2015-08-19 (×6): qty 1

## 2015-08-19 MED ORDER — VITAL HIGH PROTEIN PO LIQD
1000.0000 mL | ORAL | Status: DC
  Administered 2015-08-19 – 2015-08-20 (×4): 1000 mL

## 2015-08-19 MED ORDER — POTASSIUM CHLORIDE 10 MEQ/50ML IV SOLN
10.0000 meq | INTRAVENOUS | Status: AC
Start: 2015-08-19 — End: 2015-08-19
  Administered 2015-08-19 (×6): 10 meq via INTRAVENOUS
  Filled 2015-08-19 (×5): qty 50

## 2015-08-19 MED ORDER — AMIODARONE LOAD VIA INFUSION
150.0000 mg | Freq: Once | INTRAVENOUS | Status: AC
  Administered 2015-08-19: 150 mg via INTRAVENOUS
  Filled 2015-08-19: qty 83.34

## 2015-08-19 MED ORDER — METOPROLOL TARTRATE 50 MG PO TABS
50.0000 mg | ORAL_TABLET | Freq: Two times a day (BID) | ORAL | Status: DC
  Administered 2015-08-19 – 2015-08-21 (×4): 50 mg
  Filled 2015-08-19 (×4): qty 1

## 2015-08-19 NOTE — Progress Notes (Addendum)
Primary cardiologist :  Domenic Polite   PROGRESS NOTE  Subjective:   65 y.o.male with past medical history outlined below, referred today by his nephrologist for an echocardiogram secondary to elevated heart rate and leg edema. He then was referred from the cardiovascular lab to the ER after call placed to Dr. Lowanda Foster reporting patient heart rate in the 120s and associated dizziness. He has been found to be in atrial flutter with RVR of uncertain duration, and has evidence of a cardiomyopathy with LVEF approximately 25%.  He developed respiratory distress, temperature of 104 axillary, worsening tachycardia .  He was seen [facility] at [facility] by Dr. Domenic Polite and was transferred to [facility] for further management of combined septic/cardiogenic shock.    He remains on vent.  Weaning. Now down to 40% FiO2. Follows commands. Norepi off.  Remains in AF/AFL with RVR in 120 range. Amio increased yesterday with little effect on rate. Off heparin due to falling platelets. (HIT ab pending). Making lots of urine (3.2L) but weight only down 1 pound. CVP 9. Creatinine 5.3-> 4.6-> 3.8     Objective:    Vital Signs:   Temp:  [97.9 F (36.6 C)-100 F (37.8 C)] 98.8 F (37.1 C) (06/17 1225) Pulse Rate:  [115-120] 116 (06/17 1111) Resp:  [10-22] 10 (06/17 1200) BP: (120-177)/(67-92) 139/83 mmHg (06/17 1200) SpO2:  [91 %-100 %] 95 % (06/17 1200) Arterial Line BP: (95-161)/(66-154) 145/140 mmHg (06/17 1200) FiO2 (%):  [40 %-70 %] 40 % (06/17 1200) Weight:  [127.6 kg (281 lb 4.9 oz)] 127.6 kg (281 lb 4.9 oz) (06/17 0400)  Last BM Date: 08/19/15   24-hour weight change: Weight change:   Weight trends: Filed Weights   08/16/15 0500 08/17/15 0407 08/19/15 0400  Weight: 122 kg (268 lb 15.4 oz) 123.1 kg (271 lb 6.2 oz) 127.6 kg (281 lb 4.9 oz)    Intake/Output:  06/16 0701 - 06/17 0700 In: 2561.8 [I.V.:2411.8; IV Piggyback:150] Out: 2200 [Urine:1900; Stool:300] Total I/O In: 673.5 [I.V.:323.5;  NG/GT:150; IV Piggyback:200] Out: 093 [Urine:675]   Physical Exam: BP 139/83 mmHg  Pulse 116  Temp(Src) 98.8 F (37.1 C) (Oral)  Resp 10  Ht _0  (1.676 m)  Wt 127.6 kg (281 lb 4.9 oz)  BMI 45.43 kg/m2  SpO2 95%  Wt Readings from Last 3 Encounters:  08/19/15 127.6 kg (281 lb 4.9 oz)  06/16/15 118.842 kg (262 lb)  06/22/14 120.203 kg (265 lb)    General: Vital signs reviewed and noted.  Intubated, awake on vent  Head: ETT in place.   Eyes: conjunctivae/corneas clear.  EOM's intact.   Throat: normal  Neck:  normal   Lungs:    clear , on the vent   Heart:  Irreg.  tachy  Abdomen:  Soft, non-tender, + distended  Hypoactive BS  Extremities: 2-3+ edema  + foley  Neurologic: Sedated, on the vent  Follows command when awake  Psych:  NA    Labs: BMET:  Recent Labs  08/18/15 1145 08/19/15 0438  NA 134* 136  K 3.5 3.3*  CL 97* 98*  CO2 25 24  GLUCOSE 132* 150*  BUN 79* 79*  CREATININE 5.34* 4.66*  CALCIUM 6.8* 7.4*  PHOS 5.7*  --     Liver function tests:  Recent Labs  08/17/15 0438 08/18/15 1145  AST 109*  --   ALT 89*  --   ALKPHOS 80  --   BILITOT 1.1  --   PROT 4.5*  --  ALBUMIN 1.8* 1.5*   No results for input(s): LIPASE, AMYLASE in the last 72 hours.  CBC:  Recent Labs  08/18/15 0515 08/19/15 0452  WBC 6.6 5.8  HGB 9.1* 9.3*  HCT 27.2* 28.4*  MCV 85.5 85.0  PLT 70* 66*    Cardiac Enzymes: No results for input(s): CKTOTAL, CKMB, TROPONINI in the last 72 hours.  Coagulation Studies: No results for input(s): LABPROT, INR in the last 72 hours.  Other: Invalid input(s): POCBNP No results for input(s): DDIMER in the last 72 hours. No results for input(s): HGBA1C in the last 72 hours. No results for input(s): CHOL, HDL, LDLCALC, TRIG, CHOLHDL in the last 72 hours. No results for input(s): TSH, T4TOTAL, T3FREE, THYROIDAB in the last 72 hours.  Invalid input(s): FREET3 No results for input(s): VITAMINB12, FOLATE, FERRITIN, TIBC, IRON,  RETICCTPCT in the last 72 hours.  Urine is bloody   Other results:       Medications:    Infusions: . amiodarone 30 mg/hr (08/19/15 0800)  . dexmedetomidine Stopped (08/18/15 0800)  . fentaNYL infusion INTRAVENOUS Stopped (08/19/15 0900)  . norepinephrine (LEVOPHED) Adult infusion Stopped (08/18/15 1018)    Scheduled Medications: . antiseptic oral rinse  7 mL Mouth Rinse 10 times per day  . [START ON 08/20/2015] cefTRIAXone (ROCEPHIN)  IV  2 g Intravenous Q24H  . chlorhexidine gluconate (SAGE KIT)  15 mL Mouth Rinse BID  . famotidine (PEPCID) IV  20 mg Intravenous Q24H  . insulin aspart  0-15 Units Subcutaneous Q4H  . insulin glargine  20 Units Subcutaneous Daily  . metoprolol tartrate  75 mg Per Tube BID    Assessment/ Plan:   Principal Problem:   Atrial flutter with rapid ventricular response (HCC) Active Problems:   GERD   DM (diabetes mellitus) ([place])   HTN (hypertension)   Hyperlipemia   Sleep apnea   Systolic CHF ([place])   Acute respiratory failure ([place])   Septic shock ([place])   Acute encephalopathy   Acute kidney injury ([place])  1. Septic/cardiogenic shock 2. Acute systolic CHF - suspect tachy-induced. EF 60-65% in 2015 3. AFL with RVR 4. Acute on chronic renal insufficiency, stage 4 5. Urinary retention with traumatic Foley insetion 6. Thrombocytopenia/anemia 7. Acute respiratory failure 8. DM2 9. Hypokalemia   Shock is much improved. Now off pressors. Renal function improving. Remains in AFL with RVR. Rate not responding to b-blocker or amio. Will need TEE and DC-CV. Platelets improving. Highly doubt HIT (likely was sepsis). Will restart heparin (no bolus). If platelets drop again can switch to argatroban.  Needs more diuresis. Weight still up 20+ pounds from baseline. Lasix 80 IV bid today.  Supp K+  Check mag.  Adjust insulin.   The patient is critically ill with multiple organ systems failure and requires high complexity decision making for assessment  and support, frequent evaluation and titration of therapies, application of advanced monitoring technologies and extensive interpretation of multiple databases.   Critical Care Time devoted to patient care services described in this note is 35 Minutes.  Bensimhon, Daniel,MD 1:08 PM

## 2015-08-19 NOTE — Progress Notes (Signed)
[place] Progress Note Patient Name: Frank Hoover DOB: 06/07/1950 MRN: [id number]   Date of Service  08/19/2015  HPI/Events of Note  Hypokalemia  eICU Interventions  Potassium replaced     Intervention Category Intermediate Interventions: Electrolyte abnormality - evaluation and management  DETERDING,ELIZABETH 08/19/2015, 5:34 AM

## 2015-08-19 NOTE — Progress Notes (Signed)
PULMONARY / CRITICAL CARE MEDICINE   Name: Frank Hoover MRN: [id number] DOB: 1950-07-15    ADMISSION DATE:  08/14/2015 CONSULTATION DATE:  08/15/2015  REFERRING MD:  Dr. Tamala Julian  CHIEF COMPLAINT:  Dizzy  SUBJECTIVE:  No additional fever.  No events overnight.  VITAL SIGNS: BP 138/85 mmHg  Pulse 119  Temp(Src) 98 F (36.7 C) (Oral)  Resp 22  Ht 5' 6"  (1.676 m)  Wt 127.6 kg (281 lb 4.9 oz)  BMI 45.43 kg/m2  SpO2 96%  HEMODYNAMICS: CVP:  [6 mmHg-11 mmHg] 10 mmHg  VENTILATOR SETTINGS: Vent Mode:  [-] PSV;CPAP FiO2 (%):  [40 %-70 %] 40 % Set Rate:  [16 bmp] 16 bmp Vt Set:  [510 mL] 510 mL PEEP:  [5 cmH20] 5 cmH20 Pressure Support:  [5 cmH20] 5 cmH20 Plateau Pressure:  [13 cmH20-16 cmH20] 16 cmH20  INTAKE / OUTPUT: I/O last 3 completed shifts: In: 4568.3 [I.V.:4318.3; IV Piggyback:250] Out: 2725 [Urine:2350; Emesis/NG output:75; Stool:300]  PHYSICAL EXAMINATION: General: sedated, withdraws to pain. Neuro:  RASS -2, moving all ext to pain. HEENT:  ETT in place, [place]/AT, PERRL, EOM-I Cardiovascular:  RRR, NL S1/S2, -M/R/G. Lungs:  Coarse BS diffusely. Abdomen:  Less tender, decreased BS, no guarding Musculoskeletal:  1+ edema Skin:  No rashes  LABS:  BMET  Recent Labs Lab 08/18/15 0515 08/18/15 1145 08/19/15 0438  NA 134* 134* 136  K 3.8 3.5 3.3*  CL 98* 97* 98*  CO2 24 25 24   BUN 77* 79* 79*  CREATININE 5.37* 5.34* 4.66*  GLUCOSE 140* 132* 150*    Electrolytes  Recent Labs Lab 08/14/15 1501 08/14/15 2301  08/18/15 0515 08/18/15 1145 08/19/15 0438  CALCIUM 10.1 9.6  < > 6.7* 6.8* 7.4*  MG 2.1 1.8  --   --   --   --   PHOS  --   --   --   --  5.7*  --   < > = values in this interval not displayed.  CBC  Recent Labs Lab 08/17/15 0438 08/18/15 0515 08/19/15 0452  WBC 10.6* 6.6 5.8  HGB 10.4* 9.1* 9.3*  HCT 31.2* 27.2* 28.4*  PLT 106* 70* 66*    Coag's  Recent Labs Lab 08/14/15 1601  INR 1.12    Sepsis Markers  Recent  Labs Lab 08/14/15 2340 08/15/15 0202 08/17/15 1630 08/17/15 2027  LATICACIDVEN 5.1* 4.7* 2.6* 2.2*  PROCALCITON 1.42  --   --   --     ABG  Recent Labs Lab 08/17/15 1631 08/18/15 0319 08/19/15 0350  PHART 7.388 7.404 7.341*  PCO2ART 40.5 39.4 47.2*  PO2ART 61.0* 153* 97.9    Liver Enzymes  Recent Labs Lab 08/15/15 0422 08/17/15 0438 08/18/15 1145  AST 44* 109*  --   ALT 24 89*  --   ALKPHOS 64 80  --   BILITOT 0.7 1.1  --   ALBUMIN 3.4* 1.8* 1.5*    Cardiac Enzymes  Recent Labs Lab 08/15/15 1630 08/15/15 2230 08/16/15 0310  TROPONINI 0.23* 0.26* 0.28*    Glucose  Recent Labs Lab 08/18/15 0841 08/18/15 1219 08/18/15 1701 08/18/15 2044 08/19/15 0033 08/19/15 0436  GLUCAP 133* 165* 139* 140* 147* 161*    Imaging Dg Chest Port 1 View  08/19/2015  CLINICAL DATA:  Respiratory failure. EXAM: PORTABLE CHEST 1 VIEW COMPARISON:  Radiograph of August 18, 2015. FINDINGS: Stable cardiomediastinal silhouette. Endotracheal and nasogastric tubes are unchanged in position. Right internal jugular catheter is also unchanged with tip in expected position  of SVC. No pneumothorax or pleural effusion is noted. Left lung is clear. Mild right midlung subsegmental atelectasis is noted. Bony thorax is unremarkable. IMPRESSION: Stable support apparatus. Mild right midlung subsegmental atelectasis is noted. Electronically Signed   By: Marijo Conception, M.D.   On: 08/19/2015 07:35     STUDIES:  6/12 Echo >> EF 25% 6/13 Renal u/s >> mild Rt renal atrophy 6/14 CT abd/pelvis >> distal proctocolitis  CULTURES: 6/12 Blood >> Citrobacter koseri 6/12 Urine >> Citrobacter koseri  ANTIBIOTICS: 6/12 Vancomycin >> 6/14 6/12 Zosyn >> 6/16 6/13 Cefepime >> 6/14 6/16 Fortaz >>   SIGNIFICANT EVENTS: 6/12 Admit to [facility] 6/13 Transfer to [facility], cardiology/urology consulted  LINES/TUBES: 6/13 Rt femoral CVL >> 6/13 6/13 ETT >> 6/13 Rt IJ CVL >> 6/13 Lt radial aline  >>  DISCUSSION: 65 yo male presented to [facility] with dizziness, tachycardia and leg edema.  Found to have A flutter and EF 25%.  ASSESSMENT / PLAN:  PULMONARY A: Acute hypoxic respiratory failure. Hx of OSA, COPD. P:   Needs diureses prior to weaning trials but renal function precludes at this time, will d/c IVF however. Full vent support F/u CXR, ABG Prn BDs  CARDIOVASCULAR A:  Septic shock. Chronic systolic CHF. A fib/flutter with RVR >> back in sinus rhythm. Hx of HTN. P:  Wean pressors to keep MAP > 65 Goal CVP 8 to 12 Amiodarone per cardiology Hold heparin in setting of thrombocytopenia  RENAL A:   AKI. CKD stage III >> baseline creatinine 2.4. Urethral false passage s/p cystoscopy by urology 6/13. P:   Monitor renal fx, urine outpt >> creatinine increasing, but might be reaching plateau and still making urine >> defer nephrology consultation for now BMET in AM Replace electrolytes as indicated.  GASTROINTESTINAL A:   Colitis >> abdominal exam improved 6/16. P:   TF per nutrition. Pepcid for SUP  HEMATOLOGIC A:   Anemia of critical illness. Thrombocytopenia. P:  F/u CBC Hold heparin Check HIT Ab 6/16 SCDs  INFECTIOUS A:   Septic shock from UTI and colitis >> Citrobacter in urine, blood. P:   Day 6 of Abx, changed to fortaz  ENDOCRINE A:   DM type II. P:   SSI  NEUROLOGIC A:   Acute metabolic encephalopathy. P:   RASS goal: -1 Precedex drip. PRN fentanyl.  Wife updated bedside.  The patient is critically ill with multiple organ systems failure and requires high complexity decision making for assessment and support, frequent evaluation and titration of therapies, application of advanced monitoring technologies and extensive interpretation of multiple databases.   Critical Care Time devoted to patient care services described in this note is  35  Minutes. This time reflects time of care of this signee Dr [name]. This critical care  time does not reflect procedure time, or teaching time or supervisory time of PA/NP/Med student/Med Resident etc but could involve care discussion time.  Rush Farmer, M.D. [facility] Pulmonary/Critical Care Medicine. Pager: [phone]. After hours pager: [phone].

## 2015-08-19 NOTE — Progress Notes (Signed)
Nutrition Follow-up  DOCUMENTATION CODES:   Morbid obesity  INTERVENTION:  Initiate Vital High Protein via OGT at 20 ml/hr and increased by 10 ml every 4 hours to goal of 50 ml/hr with 60 ml Prostat BID.  Tube feeding regiment will provide 1600 kcal (100% of needs), 165 grams protein, and 1003 ml H2O.   NUTRITION DIAGNOSIS:   Inadequate oral intake related to inability to eat as evidenced by NPO status; ongoing  GOAL:   Provide needs based on ASPEN/SCCM guidelines; not met  MONITOR:   I & O's, Vent status, TF tolerance  REASON FOR ASSESSMENT:   Ventilator    ASSESSMENT:   65 y/o M with admitted 6/12 to [facility] for atrial flutter, concern for UTI / sepsis. Decompensated 6/13 requiring vasopressors and intubation. 6/14 CT abd/pelvis >> distal proctocolitis   Patient is currently intubated on ventilator support MV: 9.3 L/min Temp (24hrs), Avg:98.7 F (37.1 C), Min:97.9 F (36.6 C), Max:100 F (37.8 C) OG tube, tip in body of stomach  RD consulted for enteral/tube feeding initiation and management. RD to order TF. Per MD note, pt continues to diurese as still volume overloaded. Weight up 20 + lbs from baseline.   Labs and medications reviewed. Potassium low, repleating.  Diet Order:  Diet NPO time specified  Skin:  Reviewed, no issues  Last BM:  6/17- 100 ml rectal tube  Height:   Ht Readings from Last 1 Encounters:  08/15/15 '5\' 6"'  (1.676 m)    Weight:   Wt Readings from Last 1 Encounters:  08/19/15 281 lb 4.9 oz (127.6 kg)  Admit wt 08/14/15 260 lbs (118 kg)  Ideal Body Weight:  64.5 kg  BMI:  Body mass index is 45.43 kg/(m^2).  Estimated Nutritional Needs:   Kcal:  0254-8628  Protein:  161 grams  Fluid:  > 1.5 L/day  EDUCATION NEEDS:   No education needs identified at this time  Corrin Parker, MS, RD, LDN Pager # [phone] After hours/ weekend pager # [phone]

## 2015-08-20 ENCOUNTER — Inpatient Hospital Stay (HOSPITAL_COMMUNITY): Payer: Medicare Other

## 2015-08-20 DIAGNOSIS — I5021 Acute systolic (congestive) heart failure: Secondary | ICD-10-CM | POA: Diagnosis present

## 2015-08-20 LAB — BLOOD GAS, ARTERIAL
Acid-Base Excess: 1.8 mmol/L (ref 0.0–2.0)
Bicarbonate: 26 mEq/L — ABNORMAL HIGH (ref 20.0–24.0)
Drawn by: 245131
FIO2: 50
MECHVT: 510 mL
O2 Saturation: 96.1 %
PEEP: 5 cmH2O
Patient temperature: 98.6
RATE: 16 resp/min
TCO2: 27.3 mmol/L (ref 0–100)
pCO2 arterial: 42.3 mmHg (ref 35.0–45.0)
pH, Arterial: 7.406 (ref 7.350–7.450)
pO2, Arterial: 86.9 mmHg (ref 80.0–100.0)

## 2015-08-20 LAB — CBC
HCT: 29.4 % — ABNORMAL LOW (ref 39.0–52.0)
HCT: 30.1 % — ABNORMAL LOW (ref 39.0–52.0)
Hemoglobin: 9.4 g/dL — ABNORMAL LOW (ref 13.0–17.0)
Hemoglobin: 9.8 g/dL — ABNORMAL LOW (ref 13.0–17.0)
MCH: 27.2 pg (ref 26.0–34.0)
MCH: 27.8 pg (ref 26.0–34.0)
MCHC: 32 g/dL (ref 30.0–36.0)
MCHC: 32.6 g/dL (ref 30.0–36.0)
MCV: 85 fL (ref 78.0–100.0)
MCV: 85.3 fL (ref 78.0–100.0)
Platelets: 88 10*3/uL — ABNORMAL LOW (ref 150–400)
Platelets: 115 10*3/uL — ABNORMAL LOW (ref 150–400)
RBC: 3.46 MIL/uL — ABNORMAL LOW (ref 4.22–5.81)
RBC: 3.53 MIL/uL — ABNORMAL LOW (ref 4.22–5.81)
RDW: 15.5 % (ref 11.5–15.5)
RDW: 15.7 % — ABNORMAL HIGH (ref 11.5–15.5)
WBC: 6.6 10*3/uL (ref 4.0–10.5)
WBC: 8 10*3/uL (ref 4.0–10.5)

## 2015-08-20 LAB — GLUCOSE, CAPILLARY
Glucose-Capillary: 242 mg/dL — ABNORMAL HIGH (ref 65–99)
Glucose-Capillary: 263 mg/dL — ABNORMAL HIGH (ref 65–99)
Glucose-Capillary: 273 mg/dL — ABNORMAL HIGH (ref 65–99)
Glucose-Capillary: 292 mg/dL — ABNORMAL HIGH (ref 65–99)
Glucose-Capillary: 312 mg/dL — ABNORMAL HIGH (ref 65–99)
Glucose-Capillary: 345 mg/dL — ABNORMAL HIGH (ref 65–99)

## 2015-08-20 LAB — BASIC METABOLIC PANEL
Anion gap: 13 (ref 5–15)
BUN: 77 mg/dL — ABNORMAL HIGH (ref 6–20)
CO2: 25 mmol/L (ref 22–32)
Calcium: 8.3 mg/dL — ABNORMAL LOW (ref 8.9–10.3)
Chloride: 101 mmol/L (ref 101–111)
Creatinine, Ser: 3.85 mg/dL — ABNORMAL HIGH (ref 0.61–1.24)
GFR calc Af Amer: 18 mL/min — ABNORMAL LOW (ref >60)
GFR calc non Af Amer: 15 mL/min — ABNORMAL LOW (ref >60)
Glucose, Bld: 256 mg/dL — ABNORMAL HIGH (ref 65–99)
Potassium: 3.4 mmol/L — ABNORMAL LOW (ref 3.5–5.1)
Sodium: 139 mmol/L (ref 135–145)

## 2015-08-20 LAB — MAGNESIUM
Magnesium: 2.5 mg/dL — ABNORMAL HIGH (ref 1.7–2.4)
Magnesium: 2.5 mg/dL — ABNORMAL HIGH (ref 1.7–2.4)

## 2015-08-20 LAB — HEPARIN LEVEL (UNFRACTIONATED): Heparin Unfractionated: 0.2 IU/mL — ABNORMAL LOW (ref 0.30–0.70)

## 2015-08-20 LAB — HEPARIN INDUCED PLATELET AB (HIT ANTIBODY): Heparin Induced Plt Ab: 0.169 OD (ref 0.000–0.400)

## 2015-08-20 LAB — PHOSPHORUS: Phosphorus: 4.2 mg/dL (ref 2.5–4.6)

## 2015-08-20 MED ORDER — INSULIN GLARGINE 100 UNIT/ML ~~LOC~~ SOLN
25.0000 [IU] | Freq: Every day | SUBCUTANEOUS | Status: DC
  Administered 2015-08-20 – 2015-08-22 (×3): 25 [IU] via SUBCUTANEOUS
  Filled 2015-08-20 (×6): qty 0.25

## 2015-08-20 MED ORDER — CETYLPYRIDINIUM CHLORIDE 0.05 % MT LIQD
7.0000 mL | Freq: Two times a day (BID) | OROMUCOSAL | Status: DC
  Administered 2015-08-20 – 2015-08-21 (×2): 7 mL via OROMUCOSAL

## 2015-08-20 MED ORDER — FUROSEMIDE 10 MG/ML IJ SOLN
80.0000 mg | Freq: Two times a day (BID) | INTRAMUSCULAR | Status: DC
  Administered 2015-08-20 – 2015-08-21 (×4): 80 mg via INTRAVENOUS
  Filled 2015-08-20 (×4): qty 8

## 2015-08-20 MED ORDER — HEPARIN (PORCINE) IN NACL 100-0.45 UNIT/ML-% IJ SOLN
1700.0000 [IU]/h | INTRAMUSCULAR | Status: DC
  Administered 2015-08-20: 1300 [IU]/h via INTRAVENOUS
  Administered 2015-08-21: 1700 [IU]/h via INTRAVENOUS
  Administered 2015-08-21: 1600 [IU]/h via INTRAVENOUS
  Administered 2015-08-22 – 2015-08-23 (×3): 1700 [IU]/h via INTRAVENOUS
  Filled 2015-08-20 (×7): qty 250

## 2015-08-20 MED ORDER — POTASSIUM CHLORIDE 10 MEQ/50ML IV SOLN
10.0000 meq | INTRAVENOUS | Status: AC
  Administered 2015-08-20 (×2): 10 meq via INTRAVENOUS
  Filled 2015-08-20 (×2): qty 50

## 2015-08-20 MED ORDER — POTASSIUM CHLORIDE 10 MEQ/50ML IV SOLN
10.0000 meq | INTRAVENOUS | Status: AC
  Administered 2015-08-20 (×6): 10 meq via INTRAVENOUS
  Filled 2015-08-20 (×6): qty 50

## 2015-08-20 NOTE — Progress Notes (Signed)
[facility] Progress Note Patient Name: Frank Hoover DOB: April 04, 1950 MRN: [id number]   Date of Service  08/20/2015  HPI/Events of Note  Hypokalemia  eICU Interventions  Potassium replaced     Intervention Category Intermediate Interventions: Electrolyte abnormality - evaluation and management  [name] 08/20/2015, 5:13 AM

## 2015-08-20 NOTE — Progress Notes (Signed)
ANTICOAGULATION CONSULT NOTE - Follow Up Consult  Pharmacy Consult for Heparin Indication: atrial fibrillation  No Known Allergies  Patient Measurements: Height: 5\' 6"  (167.6 cm) Weight: 280 lb 3.3 oz (127.1 kg) IBW/kg (Calculated) : 63.8 Heparin Dosing Weight: 94 kg  Vital Signs: Temp: 98.7 F (37.1 C) (06/18 1622) Temp Source: Oral (06/18 1622) BP: 169/79 mmHg (06/18 1800) Pulse Rate: 122 (06/18 1110)  Labs:  Recent Labs  08/18/15 0515 08/18/15 1145 08/19/15 0438 08/19/15 0452 08/20/15 0430 08/20/15 1800  HGB 9.1*  --   --  9.3* 9.4* 9.8*  HCT 27.2*  --   --  28.4* 29.4* 30.1*  PLT 70*  --   --  66* 88* 115*  HEPARINUNFRC 0.45  --   --   --   --  0.20*  CREATININE 5.37* 5.34* 4.66*  --  3.85*  --     Estimated Creatinine Clearance: 24.8 mL/min (by C-G formula based on Cr of 3.85).   Medications:  Heparin @ 1300 units/hr (13 ml/hr)  Assessment: 70 YOM admitted 08/14/2015 with newly diagnosed AFlutter with RVR. Pharmacy consulted to dose heparin.  PMH HTN, COPD, smoker, DM2, GERD, HLD, OA, CKD3, diverticulosis, gout, HFrEF (EF 25%)  Heparin d/c 6/13 d/t frank blood from urethra likely from traumatic foley insertion, which has since resolved and heparin resumed. Stopped again on 6/16 with plt decline and concern for HIT. HIT panel ordered and IP, however likely other causes for plt drop. Also was septic, found to have citrobacter bacteremia and now off pressors.  Heparin restarted this morning at the previously known therapeutic rate. Heparin level this evening is SUBtherapeutic however was drawn ~2 hours early (HL 0.2, goal of 0.3-0.7). CBC stable, plts trending up 115 << 88. Will increase the drip rate slightly.   Goal of Therapy:  Heparin level 0.3-0.7 units/ml Monitor platelets by anticoagulation protocol: Yes   Plan:  1. Increase heparin drip rate slightly to 1400 units/hr (14 ml/hr) 2. Will continue to monitor for any signs/symptoms of bleeding and will  follow up with heparin level in 8 hours   Alycia Rossetti, PharmD, BCPS Clinical Pharmacist Pager: [phone] 08/20/2015 7:49 PM

## 2015-08-20 NOTE — Progress Notes (Signed)
ANTICOAGULATION CONSULT NOTE   Pharmacy Consult for heparin Indication: atrial fibrillation  No Known Allergies  Patient Measurements: Height: 5\' 6"  (167.6 cm) Weight: 280 lb 3.3 oz (127.1 kg) IBW/kg (Calculated) : 63.8 HEPARIN DW (KG): 91.8  Vital Signs: Temp: 98.8 F (37.1 C) (06/18 1131) Temp Source: Oral (06/18 1131) BP: 155/88 mmHg (06/18 1200) Pulse Rate: 122 (06/18 1110)  Labs:  Recent Labs  08/18/15 0515 08/18/15 1145 08/19/15 0438 08/19/15 0452 08/20/15 0430  HGB 9.1*  --   --  9.3* 9.4*  HCT 27.2*  --   --  28.4* 29.4*  PLT 70*  --   --  66* 88*  HEPARINUNFRC 0.45  --   --   --   --   CREATININE 5.37* 5.34* 4.66*  --  3.85*    Estimated Creatinine Clearance: 24.8 mL/min (by C-G formula based on Cr of 3.85).   Medical History: Past Medical History  Diagnosis Date  . Gout   . COPD (chronic obstructive pulmonary disease) ([place])   . Type 2 diabetes mellitus ([place])   . GERD (gastroesophageal reflux disease)   . Essential hypertension   . Hyperlipemia   . OA (osteoarthritis)   . Gastroparesis   . Adenomatous colon polyp 10/30/09    Serrated adenoma removed during Colonoscopy   . Diverticulosis of colon   . CKD (chronic kidney disease) stage 3, GFR 30-59 ml/min     Medications:  See EMR  Assessment: 65 yo man admitted 08/14/2015 with newly diagnosed AFlutter with RVR. Pharmacy consulted to dose heparin.  PMH HTN, COPD, smoker, DM2, GERD, HLD, OA, CKD3, diverticulosis, gout, HFrEF (EF 25%)  Heparin d/c 6/13 d/t frank blood from urethra likely from traumatic foley insertion, which has since resolved and heparin resumed. Stopped again on 6/16 with plt decline and concern for HIT. HIT panel ordered and IP, however likely other causes for plt drop. Also was septic, found to have citrobacter bacteremia and now off pressors.  Restarting heparin today at previously therapeutic rate.  Hgb 9.4, plt trending back up to 88. No noted bleeding.  Goal of Therapy:   Heparin level 0.3-0.7 units/ml Monitor platelets by anticoagulation protocol: Yes    Plan:  -Heparin 1300 units/hr -1800 HL and CBC -Daily HL, CBC -Monitor s/sx bleeding   Heloise Ochoa, Pharm.D., BCPS PGY2 Cardiology Pharmacy Resident Pager: [phone] 08/20/2015 1:42 PM     Addendum -Stop heparin -Watch plts, f/u HIT panel -Stop zosyn, start ceftazidime for citrobacter UTI + bacteremia -Ceftazidime 2g/24h -Monitor renal fx, cultures, duration of therapy   Wynelle Fanny  08/20/2015 1:42 PM

## 2015-08-20 NOTE — Progress Notes (Signed)
PULMONARY / CRITICAL CARE MEDICINE   Name: Frank Hoover MRN: [id number] DOB: 1950-06-08    ADMISSION DATE:  08/14/2015 CONSULTATION DATE:  08/15/2015  REFERRING MD:  Dr. Tamala Julian  CHIEF COMPLAINT:  Dizzy  SUBJECTIVE:  No additional fever.  No events overnight.  VITAL SIGNS: BP 162/88 mmHg  Pulse 124  Temp(Src) 97.7 F (36.5 C) (Oral)  Resp 15  Ht 5' 6"  (1.676 m)  Wt 127.1 kg (280 lb 3.3 oz)  BMI 45.25 kg/m2  SpO2 100%  HEMODYNAMICS: CVP:  [8 mmHg-10 mmHg] 9 mmHg  VENTILATOR SETTINGS: Vent Mode:  [-] PSV;CPAP FiO2 (%):  [40 %] 40 % Set Rate:  [16 bmp] 16 bmp Vt Set:  [510 mL] 510 mL PEEP:  [5 cmH20] 5 cmH20 Pressure Support:  [5 cmH20] 5 cmH20 Plateau Pressure:  [21 cmH20] 21 cmH20  INTAKE / OUTPUT: I/O last 3 completed shifts: In: 3215.2 [I.V.:2161.6; NG/GT:603.7; IV Piggyback:450] Out: 9449 [Urine:4330; Emesis/NG output:650; Stool:100]  PHYSICAL EXAMINATION: General: awake and interactive. Neuro:  RASS -2, moving all ext to command. HEENT:  ETT in place, [place]/AT, PERRL, EOM-I Cardiovascular:  RRR, NL S1/S2, -M/R/G. Lungs:  Coarse BS diffusely. Abdomen:  Less tender, decreased BS, no guarding  Musculoskeletal:  1+ edema Skin:  No rashes  LABS:  BMET  Recent Labs Lab 08/18/15 1145 08/19/15 0438 08/20/15 0430  NA 134* 136 139  K 3.5 3.3* 3.4*  CL 97* 98* 101  CO2 25 24 25   BUN 79* 79* 77*  CREATININE 5.34* 4.66* 3.85*  GLUCOSE 132* 150* 256*   Electrolytes  Recent Labs Lab 08/14/15 1501 08/14/15 2301  08/18/15 1145 08/19/15 0438 08/20/15 0430  CALCIUM 10.1 9.6  < > 6.8* 7.4* 8.3*  MG 2.1 1.8  --   --   --  2.5*  PHOS  --   --   --  5.7*  --  4.2  < > = values in this interval not displayed.  CBC  Recent Labs Lab 08/18/15 0515 08/19/15 0452 08/20/15 0430  WBC 6.6 5.8 6.6  HGB 9.1* 9.3* 9.4*  HCT 27.2* 28.4* 29.4*  PLT 70* 66* 88*   Coag's  Recent Labs Lab 08/14/15 1601  INR 1.12   Sepsis Markers  Recent Labs Lab  08/14/15 2340 08/15/15 0202 08/17/15 1630 08/17/15 2027  LATICACIDVEN 5.1* 4.7* 2.6* 2.2*  PROCALCITON 1.42  --   --   --    ABG  Recent Labs Lab 08/18/15 0319 08/19/15 0350 08/20/15 0340  PHART 7.404 7.341* 7.406  PCO2ART 39.4 47.2* 42.3  PO2ART 153* 97.9 86.9   Liver Enzymes  Recent Labs Lab 08/15/15 0422 08/17/15 0438 08/18/15 1145  AST 44* 109*  --   ALT 24 89*  --   ALKPHOS 64 80  --   BILITOT 0.7 1.1  --   ALBUMIN 3.4* 1.8* 1.5*   Cardiac Enzymes  Recent Labs Lab 08/15/15 1630 08/15/15 2230 08/16/15 0310  TROPONINI 0.23* 0.26* 0.28*   Glucose  Recent Labs Lab 08/19/15 1650 08/19/15 2036 08/20/15 0024 08/20/15 0031 08/20/15 0418 08/20/15 0804  GLUCAP 204* 175* 263* 273* 242* 292*   Imaging Dg Chest Port 1 View  08/20/2015  CLINICAL DATA:  Patient with history of ETT. EXAM: PORTABLE CHEST 1 VIEW COMPARISON:  Chest radiograph 08/19/2015. FINDINGS: Right IJ central venous catheter tip projects over the superior vena cava. ET tube terminates in the distal trachea. Enteric tube tip and side-port project over the stomach. Low lung volumes. Stable enlarged cardiac and  mediastinal contours. Interval increase left mid lower lung heterogeneous opacities with small layering left pleural effusion. Unchanged bandlike opacity right mid lung. IMPRESSION: Stable support apparatus. Interval increase heterogeneous opacities left mid lower lung most compatible with atelectasis and small left pleural effusion. Probable right mid lung subsegmental atelectasis. Electronically Signed   By: Lovey Newcomer M.D.   On: 08/20/2015 08:22   STUDIES:  6/12 Echo >> EF 25% 6/13 Renal u/s >> mild Rt renal atrophy 6/14 CT abd/pelvis >> distal proctocolitis  CULTURES: 6/12 Blood >> Citrobacter koseri 6/12 Urine >> Citrobacter koseri  ANTIBIOTICS: 6/12 Vancomycin >> 6/14 6/12 Zosyn >> 6/16 6/13 Cefepime >> 6/14 6/16 Fortaz >>   SIGNIFICANT EVENTS: 6/12 Admit to [facility] 6/13  Transfer to [facility], cardiology/urology consulted  LINES/TUBES: 6/13 Rt femoral CVL >> 6/13 6/13 ETT >>6/18 6/13 Rt IJ CVL >> 6/13 Lt radial aline >>  DISCUSSION: 65 yo male presented to [facility] with dizziness, tachycardia and leg edema.  Found to have A flutter and EF 25%.  ASSESSMENT / PLAN:  PULMONARY A: Acute hypoxic respiratory failure. Hx of OSA, COPD. P:   Extubate today. Continue diureses. IS. Titrate O2 for sat of 88-92% Prn BDs  CARDIOVASCULAR A:  Septic shock. Chronic systolic CHF. A fib/flutter with RVR >> back in sinus rhythm. Hx of HTN. P:  D/C pressors. Goal CVP 8 to 12 Amiodarone per cardiology Hold heparin in setting of thrombocytopenia  RENAL A:   AKI. CKD stage III >> baseline creatinine 2.4. Urethral false passage s/p cystoscopy by urology 6/13. P:   Monitor renal fx, urine outpt, good UOP, hold off renal consult for now Lasix 80 mg IV BID. BMET in AM Replace electrolytes as indicated.  GASTROINTESTINAL A:   Colitis >> abdominal exam improved 6/16. P:   D/C TF for extubation SLP Pepcid for SUP  HEMATOLOGIC A:   Anemia of critical illness. Thrombocytopenia. P:  F/u CBC Hold heparin Check HIT Ab 6/16 pending SCDs  INFECTIOUS A:   Septic shock from UTI and colitis >> Citrobacter in urine, blood. P:   Day 7 of Abx, changed to fortaz, will continue for now.  ENDOCRINE A:   DM type II. P:   SSI  NEUROLOGIC A:   Acute metabolic encephalopathy. P:   RASS goal: -1 D/C Precedex drip. PRN fentanyl.  Patient updated bedside.  The patient is critically ill with multiple organ systems failure and requires high complexity decision making for assessment and support, frequent evaluation and titration of therapies, application of advanced monitoring technologies and extensive interpretation of multiple databases.   Critical Care Time devoted to patient care services described in this note is  35  Minutes. This time reflects time of  care of this signee Dr [name]. This critical care time does not reflect procedure time, or teaching time or supervisory time of PA/NP/Med student/Med Resident etc but could involve care discussion time.  Rush Farmer, M.D. [facility] Pulmonary/Critical Care Medicine. Pager: [phone]. After hours pager: [phone].

## 2015-08-20 NOTE — Procedures (Signed)
Extubation Procedure Note  Patient Details:   Name: Frank Hoover DOB: 1950/09/07 MRN: [id number]   Pt extubated to 4L [place] per MD order. Pt able to vocalize, no stridor noted. Pt tolerating well at this time. RT will continue to monitor.    Evaluation  O2 sats: stable throughout Complications: No apparent complications Patient did tolerate procedure well. Bilateral Breath Sounds: Clear, Diminished   Yes  [name] 08/20/2015, 11:23 AM

## 2015-08-21 DIAGNOSIS — J9600 Acute respiratory failure, unspecified whether with hypoxia or hypercapnia: Secondary | ICD-10-CM

## 2015-08-21 LAB — BASIC METABOLIC PANEL
Anion gap: 12 (ref 5–15)
BUN: 77 mg/dL — ABNORMAL HIGH (ref 6–20)
CO2: 27 mmol/L (ref 22–32)
Calcium: 8.6 mg/dL — ABNORMAL LOW (ref 8.9–10.3)
Chloride: 105 mmol/L (ref 101–111)
Creatinine, Ser: 3.25 mg/dL — ABNORMAL HIGH (ref 0.61–1.24)
GFR calc Af Amer: 22 mL/min — ABNORMAL LOW (ref >60)
GFR calc non Af Amer: 19 mL/min — ABNORMAL LOW (ref >60)
Glucose, Bld: 281 mg/dL — ABNORMAL HIGH (ref 65–99)
Potassium: 3.2 mmol/L — ABNORMAL LOW (ref 3.5–5.1)
Sodium: 144 mmol/L (ref 135–145)

## 2015-08-21 LAB — GLUCOSE, CAPILLARY
Glucose-Capillary: 264 mg/dL — ABNORMAL HIGH (ref 65–99)
Glucose-Capillary: 269 mg/dL — ABNORMAL HIGH (ref 65–99)
Glucose-Capillary: 273 mg/dL — ABNORMAL HIGH (ref 65–99)
Glucose-Capillary: 235 mg/dL — ABNORMAL HIGH (ref 65–99)
Glucose-Capillary: 325 mg/dL — ABNORMAL HIGH (ref 65–99)
Glucose-Capillary: 361 mg/dL — ABNORMAL HIGH (ref 65–99)
Glucose-Capillary: 456 mg/dL — ABNORMAL HIGH (ref 65–99)
Glucose-Capillary: 377 mg/dL — ABNORMAL HIGH (ref 65–99)
Glucose-Capillary: 320 mg/dL — ABNORMAL HIGH (ref 65–99)

## 2015-08-21 LAB — BLOOD GAS, ARTERIAL
Acid-base deficit: 0.2 mmol/L (ref 0.0–2.0)
Bicarbonate: 24.8 mEq/L — ABNORMAL HIGH (ref 20.0–24.0)
Drawn by: 24513
FIO2: 0.6
MECHVT: 510 mL
O2 Saturation: 96.8 %
PEEP: 5 cmH2O
Patient temperature: 98.7
RATE: 16 resp/min
TCO2: 26.3 mmol/L (ref 0–100)
pCO2 arterial: 47.2 mmHg — ABNORMAL HIGH (ref 35.0–45.0)
pH, Arterial: 7.341 — ABNORMAL LOW (ref 7.350–7.450)
pO2, Arterial: 97.9 mmHg (ref 80.0–100.0)

## 2015-08-21 LAB — CBC
HCT: 31.4 % — ABNORMAL LOW (ref 39.0–52.0)
Hemoglobin: 9.8 g/dL — ABNORMAL LOW (ref 13.0–17.0)
MCH: 27 pg (ref 26.0–34.0)
MCHC: 31.2 g/dL (ref 30.0–36.0)
MCV: 86.5 fL (ref 78.0–100.0)
Platelets: 148 10*3/uL — ABNORMAL LOW (ref 150–400)
RBC: 3.63 MIL/uL — ABNORMAL LOW (ref 4.22–5.81)
RDW: 15.9 % — ABNORMAL HIGH (ref 11.5–15.5)
WBC: 10.2 10*3/uL (ref 4.0–10.5)

## 2015-08-21 LAB — HEPARIN LEVEL (UNFRACTIONATED)
Heparin Unfractionated: 0.22 IU/mL — ABNORMAL LOW (ref 0.30–0.70)
Heparin Unfractionated: 0.33 IU/mL (ref 0.30–0.70)
Heparin Unfractionated: 0.3 IU/mL (ref 0.30–0.70)

## 2015-08-21 LAB — MAGNESIUM: Magnesium: 2.3 mg/dL (ref 1.7–2.4)

## 2015-08-21 LAB — PHOSPHORUS: Phosphorus: 3.1 mg/dL (ref 2.5–4.6)

## 2015-08-21 MED ORDER — CLONIDINE HCL 0.1 MG PO TABS
0.2000 mg | ORAL_TABLET | Freq: Three times a day (TID) | ORAL | Status: DC
Start: 2015-08-21 — End: 2015-08-23
  Administered 2015-08-21 – 2015-08-22 (×6): 0.2 mg via ORAL
  Filled 2015-08-21 (×6): qty 2

## 2015-08-21 MED ORDER — INSULIN ASPART 100 UNIT/ML ~~LOC~~ SOLN
0.0000 [IU] | Freq: Three times a day (TID) | SUBCUTANEOUS | Status: DC
  Administered 2015-08-21: 15 [IU] via SUBCUTANEOUS
  Administered 2015-08-22: 8 [IU] via SUBCUTANEOUS
  Administered 2015-08-22 – 2015-08-23 (×2): 5 [IU] via SUBCUTANEOUS
  Administered 2015-08-23: 3 [IU] via SUBCUTANEOUS
  Administered 2015-08-23: 5 [IU] via SUBCUTANEOUS
  Administered 2015-08-24: 3 [IU] via SUBCUTANEOUS
  Administered 2015-08-24: 2 [IU] via SUBCUTANEOUS
  Administered 2015-08-24 – 2015-08-25 (×2): 5 [IU] via SUBCUTANEOUS
  Administered 2015-08-25 – 2015-08-26 (×4): 3 [IU] via SUBCUTANEOUS
  Administered 2015-08-26: 8 [IU] via SUBCUTANEOUS
  Administered 2015-08-27: 3 [IU] via SUBCUTANEOUS
  Administered 2015-08-27: 2 [IU] via SUBCUTANEOUS
  Administered 2015-08-27: 5 [IU] via SUBCUTANEOUS
  Administered 2015-08-28 (×3): 3 [IU] via SUBCUTANEOUS
  Administered 2015-08-29: 15 [IU] via SUBCUTANEOUS
  Administered 2015-08-29: 3 [IU] via SUBCUTANEOUS
  Administered 2015-08-30: 5 [IU] via SUBCUTANEOUS
  Administered 2015-08-30: 2 [IU] via SUBCUTANEOUS
  Administered 2015-08-30: 5 [IU] via SUBCUTANEOUS
  Administered 2015-08-31 – 2015-09-01 (×2): 3 [IU] via SUBCUTANEOUS
  Administered 2015-09-01 (×2): 2 [IU] via SUBCUTANEOUS
  Administered 2015-09-02: 3 [IU] via SUBCUTANEOUS
  Administered 2015-09-02 – 2015-09-03 (×2): 2 [IU] via SUBCUTANEOUS
  Administered 2015-09-03 – 2015-09-05 (×2): 3 [IU] via SUBCUTANEOUS
  Administered 2015-09-06: 2 [IU] via SUBCUTANEOUS
  Administered 2015-09-06: 3 [IU] via SUBCUTANEOUS
  Administered 2015-09-07: 5 [IU] via SUBCUTANEOUS

## 2015-08-21 MED ORDER — METOPROLOL TARTRATE 50 MG PO TABS
100.0000 mg | ORAL_TABLET | Freq: Two times a day (BID) | ORAL | Status: DC
  Administered 2015-08-21: 100 mg
  Filled 2015-08-21: qty 2

## 2015-08-21 MED ORDER — POTASSIUM CHLORIDE 10 MEQ/50ML IV SOLN: 10.0000 meq | INTRAVENOUS | Status: DC

## 2015-08-21 MED ORDER — GLUCERNA SHAKE PO LIQD
237.0000 mL | Freq: Two times a day (BID) | ORAL | Status: DC
  Administered 2015-08-23 (×2): 237 mL via ORAL

## 2015-08-21 MED ORDER — METOPROLOL TARTRATE 50 MG PO TABS
50.0000 mg | ORAL_TABLET | Freq: Once | ORAL | Status: AC
  Administered 2015-08-21: 50 mg via ORAL
  Filled 2015-08-21: qty 1

## 2015-08-21 MED ORDER — SODIUM CHLORIDE 0.9% FLUSH
10.0000 mL | INTRAVENOUS | Status: DC | PRN
  Administered 2015-08-26: 40 mL
  Administered 2015-09-03 – 2015-09-06 (×3): 10 mL
  Administered 2015-09-07: 30 mL
  Administered 2015-09-07: 10 mL
  Filled 2015-08-21 (×6): qty 40

## 2015-08-21 MED ORDER — SODIUM CHLORIDE 0.9 % IV SOLN: INTRAVENOUS | Status: DC

## 2015-08-21 MED ORDER — POTASSIUM CHLORIDE CRYS ER 20 MEQ PO TBCR
40.0000 meq | EXTENDED_RELEASE_TABLET | Freq: Once | ORAL | Status: AC
  Administered 2015-08-21: 40 meq via ORAL
  Filled 2015-08-21: qty 2

## 2015-08-21 NOTE — Progress Notes (Signed)
Nutrition Follow-up  DOCUMENTATION CODES:   Morbid obesity  INTERVENTION:   Glucerna Shake po BID, each supplement provides 220 kcal and 10 grams of protein  NUTRITION DIAGNOSIS:   Inadequate oral intake related to  (decreased appetite) as evidenced by per patient/family report. Ongoing.   GOAL:   Patient will meet greater than or equal to 90% of their needs Progressing.   MONITOR:   PO intake, Supplement acceptance, I & O's, Labs  ASSESSMENT:   65 y/o M with admitted 6/12 to [facility] for atrial flutter, concern for UTI / sepsis. Decompensated 6/13 requiring vasopressors and intubation. 6/14 CT abd/pelvis >> distal proctocolitis  6/18 extubated Pt with colitis and loose stools Per pt he has a decreased appetite but was eating well PTA.  Pt with late breakfast he was eating. Agreeable to an oral nutrition supplement.   Labs reviewed: K+3.2 CBG's: 235-273  Diet Order:  DIET DYS 2 Room service appropriate?: Yes; Fluid consistency:: Thin  Skin:  Reviewed, no issues  Last BM:  6/18 200 ml rectal pouch  Height:   Ht Readings from Last 1 Encounters:  08/15/15 5\' 6"  (1.676 m)    Weight:   Wt Readings from Last 1 Encounters:  08/21/15 268 lb 11.9 oz (121.9 kg)    Ideal Body Weight:  64.5 kg  BMI:  Body mass index is 43.4 kg/(m^2).  Estimated Nutritional Needs:   Kcal:  2100-2300  Protein:  110-125 grams  Fluid:  2 L/day  EDUCATION NEEDS:   No education needs identified at this time  [place], [place], [place] Pager [phone] After Hours Pager

## 2015-08-21 NOTE — Progress Notes (Signed)
PT Cancellation Note  Patient Details Name: Frank Hoover MRN: [id number] DOB: 10-15-1950   Cancelled Treatment:    Reason Eval/Treat Not Completed: Patient not medically ready. Noted HR 120-125, EF 25% and goal HR 65-105. ? Pending cardioversion per notes. Will follow and proceed with PT eval as medically appropriate.   Laurianne Floresca 08/21/2015, 8:02 AM Pager [phone]

## 2015-08-21 NOTE — Progress Notes (Signed)
ANTICOAGULATION CONSULT NOTE - Follow Up Consult  Pharmacy Consult for Heparin Indication: atrial fibrillation  No Known Allergies  Patient Measurements: Height: 5\' 6"  (167.6 cm) Weight: 268 lb 11.9 oz (121.9 kg) IBW/kg (Calculated) : 63.8 Heparin Dosing Weight: 94 kg  Vital Signs: Temp: 98 F (36.7 C) (06/19 1200) Temp Source: Oral (06/19 1200) BP: 167/102 mmHg (06/19 1300)  Labs:  Recent Labs  08/19/15 0438  08/20/15 0430 08/20/15 1800 08/21/15 0430 08/21/15 1259  HGB  --   < > 9.4* 9.8* 9.8*  --   HCT  --   < > 29.4* 30.1* 31.4*  --   PLT  --   < > 88* 115* 148*  --   HEPARINUNFRC  --   --   --  0.20* 0.22* 0.33  CREATININE 4.66*  --  3.85*  --  3.25*  --   < > = values in this interval not displayed.  Estimated Creatinine Clearance: 28.6 mL/min (by C-G formula based on Cr of 3.25).  Assessment: 49 YOM admitted 08/14/2015 with newly diagnosed AFlutter with RVR. Pharmacy consulted to dose heparin. Heparin level now therapeutic on 1600 units/hr. CBC stable. No issues with line or bleeding reported per RN.  Goal of Therapy:  Heparin level 0.3-0.7 units/ml Monitor platelets by anticoagulation protocol: Yes   Plan:  Continue IV heparin at current rate. Confirm heparin level in 8 hrs. Daily heparin level and CBC. F/u plans for oral anticoagulation eventually.  Uvaldo Rising, BCPS  Clinical Pharmacist Pager [phone]  08/21/2015 1:27 PM

## 2015-08-21 NOTE — Progress Notes (Signed)
TELEMETRY: Reviewed telemetry pt in atrial flutter with RVR- rate 120-125: Filed Vitals:   08/21/15 0413 08/21/15 0500 08/21/15 0600 08/21/15 0700  BP: 189/90 172/78 168/84 168/90  Pulse:      Temp: 98.6 F (37 C)     TempSrc: Oral     Resp: _0 Height:      Weight:      SpO2: 99% 99% 98% 98%    Intake/Output Summary (Last 24 hours) at 08/21/15 0739 Last data filed at 08/21/15 0600  Gross per 24 hour  Intake 1782.43 ml  Output   5300 ml  Net -3517.57 ml   Filed Weights   08/19/15 0400 08/20/15 0310 08/21/15 0407  Weight: 281 lb 4.9 oz (127.6 kg) 280 lb 3.3 oz (127.1 kg) 268 lb 11.9 oz (121.9 kg)    Subjective Now extubated. Notes some soreness in lower chest/epigastrium. Conversant but speech is slow and difficult to understand. Not completely oriented.  Marland Kitchen antiseptic oral rinse  7 mL Mouth Rinse BID  . antiseptic oral rinse  7 mL Mouth Rinse 10 times per day  . cefTRIAXone (ROCEPHIN)  IV  2 g Intravenous Q24H  . chlorhexidine gluconate (SAGE KIT)  15 mL Mouth Rinse BID  . famotidine (PEPCID) IV  20 mg Intravenous Q24H  . furosemide  80 mg Intravenous BID  . insulin aspart  0-15 Units Subcutaneous Q4H  . insulin glargine  25 Units Subcutaneous Daily  . metoprolol tartrate  50 mg Per Tube BID   . amiodarone 60 mg/hr (08/21/15 0407)  . dexmedetomidine Stopped (08/18/15 0800)  . fentaNYL infusion INTRAVENOUS Stopped (08/20/15 0739)  . heparin 1,600 Units/hr (08/21/15 0521)    LABS: Basic Metabolic Panel:  Recent Labs  08/20/15 0430 08/20/15 1335 08/21/15 0430  NA 139  --  144  K 3.4*  --  3.2*  CL 101  --  105  CO2 25  --  27  GLUCOSE 256*  --  281*  BUN 77*  --  77*  CREATININE 3.85*  --  3.25*  CALCIUM 8.3*  --  8.6*  MG 2.5* 2.5* 2.3  PHOS 4.2  --  3.1   Liver Function Tests:  Recent Labs  08/18/15 1145  ALBUMIN 1.5*   No results for input(s): LIPASE, AMYLASE in the last 72 hours. CBC:  Recent Labs  08/20/15 1800 08/21/15 0430    WBC 8.0 10.2  HGB 9.8* 9.8*  HCT 30.1* 31.4*  MCV 85.3 86.5  PLT 115* 148*   Cardiac Enzymes: No results for input(s): CKTOTAL, CKMB, CKMBINDEX, TROPONINI in the last 72 hours. BNP: No results for input(s): PROBNP in the last 72 hours. D-Dimer: No results for input(s): DDIMER in the last 72 hours. Hemoglobin A1C: No results for input(s): HGBA1C in the last 72 hours. Fasting Lipid Panel: No results for input(s): CHOL, HDL, LDLCALC, TRIG, CHOLHDL, LDLDIRECT in the last 72 hours. Thyroid Function Tests: No results for input(s): TSH, T4TOTAL, T3FREE, THYROIDAB in the last 72 hours.  Invalid input(s): FREET3   Radiology/Studies:  Dg Chest Port 1 View  08/20/2015  CLINICAL DATA:  Patient with history of ETT. EXAM: PORTABLE CHEST 1 VIEW COMPARISON:  Chest radiograph 08/19/2015. FINDINGS: Right IJ central venous catheter tip projects over the superior vena cava. ET tube terminates in the distal trachea. Enteric tube tip and side-port project over the stomach. Low lung volumes. Stable enlarged cardiac and mediastinal contours. Interval increase left mid lower lung heterogeneous opacities with small  layering left pleural effusion. Unchanged bandlike opacity right mid lung. IMPRESSION: Stable support apparatus. Interval increase heterogeneous opacities left mid lower lung most compatible with atelectasis and small left pleural effusion. Probable right mid lung subsegmental atelectasis. Electronically Signed   By: Lovey Newcomer M.D.   On: 08/20/2015 08:22    PHYSICAL EXAM General: Well developed, obese BM, in no acute distress. Head: Normocephalic, atraumatic, sclera non-icteric, oropharynx is clear Neck: Negative for carotid bruits. JVD is mildly elevated. No adenopathy Lungs: Clear anteriorly.  Breathing is unlabored. Heart: IRRR, tachy,  S1 S2 without murmurs, rubs, or gallops.  Abdomen: Soft, non-tender, non-distended with normoactive bowel sounds. No hepatomegaly. No rebound/guarding. No  obvious abdominal masses. Extremities: 2+ bilateral edema.  Distal pedal pulses are 2+ and equal bilaterally. Neuro: Alert and oriented X 3. Moves all extremities spontaneously. Psych:  Responds to questions appropriately with a normal affect.  ASSESSMENT AND PLAN: 1. Acute cardiogenic/septic shock. Clinically improved. Off vent on 4 L [place]. Renal function improving. Off pressors and now hypertensive. On IV antibiotics per critical care.  2. Acute systolic CHF. EF 25% by Echo. Cardiac enzymes mildly elevated with flat trend on 6/12-6/14. Suspect more demand ischemia but status of coronaries is unknown. Other potential etiologies include tachycardia mediated CM, sepsis, ischemia, ? Toxins. Diuresing well. I/O negative 3.5 liters yesterday. Weight down 12 lbs. Still appears edematous. Continue IV diuresis. On beta blocker. Not a candidate for ACEi/ARB/Aldactone due to ARF.  3. Atrial flutter with RVR despite IV amiodarone load and beta blocker. Would recommend TEE guided DCCV. On IV heparin. Will see we can get scheduled. 4. Hypokalemia. Replete IV per CCM 5. Thrombocytopenia. Improved even on heparin. HIT very unlikely. 6. DM type 2 7. ARF- good urine output. Renal indices improving. Avoid contrast studies for now.   The patient is critically ill with multiple organ systems failure and requires high complexity decision making for assessment and support, frequent evaluation and titration of therapies, application of advanced monitoring technologies and extensive interpretation of multiple databases.   Present on Admission:  . Atrial flutter with rapid ventricular response ([place]) . Hyperlipemia . HTN (hypertension) . GERD . Sleep apnea . Systolic CHF ([place])  Signed, Peter Martinique, [place] 08/21/2015 7:39 AM

## 2015-08-21 NOTE — Progress Notes (Signed)
ANTICOAGULATION CONSULT NOTE - Follow Up Consult  Pharmacy Consult for Heparin Indication: atrial fibrillation  No Known Allergies  Patient Measurements: Height: 5\' 6"  (167.6 cm) Weight: 268 lb 11.9 oz (121.9 kg) IBW/kg (Calculated) : 63.8 Heparin Dosing Weight: 94 kg  Vital Signs: Temp: 98.6 F (37 C) (06/19 0413) Temp Source: Oral (06/19 0413) BP: 189/90 mmHg (06/19 0413)  Labs:  Recent Labs  08/18/15 0515 08/18/15 1145 08/19/15 0438  08/20/15 0430 08/20/15 1800 08/21/15 0430  HGB 9.1*  --   --   < > 9.4* 9.8* 9.8*  HCT 27.2*  --   --   < > 29.4* 30.1* 31.4*  PLT 70*  --   --   < > 88* 115* 148*  HEPARINUNFRC 0.45  --   --   --   --  0.20* 0.22*  CREATININE 5.37* 5.34* 4.66*  --  3.85*  --   --   < > = values in this interval not displayed.  Estimated Creatinine Clearance: 24.2 mL/min (by C-G formula based on Cr of 3.85).  Assessment: 19 YOM admitted 08/14/2015 with newly diagnosed AFlutter with RVR. Pharmacy consulted to dose heparin. Heparin level remains subtherapeutic on 1400 units/hr. CBC stable. No issues with line or bleeding reported per RN.  Goal of Therapy:  Heparin level 0.3-0.7 units/ml Monitor platelets by anticoagulation protocol: Yes   Plan:  Increase heparin drip to 1600 units/hr F/u 8 hr heparin level   Sherlon Handing, PharmD, BCPS Clinical pharmacist, pager [phone] 08/21/2015 5:04 AM

## 2015-08-21 NOTE — Progress Notes (Signed)
Inpatient Diabetes Program Recommendations  AACE/ADA: New Consensus Statement on Inpatient Glycemic Control (2015)  Target Ranges:  Prepandial:   less than 140 mg/dL      Peak postprandial:   less than 180 mg/dL (1-2 hours)      Critically ill patients:  140 - 180 mg/dL  Results for KEVEON, NIVEN (MRN [id number]) as of 08/21/2015 11:43  Ref. Range 08/20/2015 16:21 08/20/2015 21:28 08/20/2015 23:48 08/21/2015 04:10 08/21/2015 08:13  Glucose-Capillary Latest Ref Range: 65-99 mg/dL 312 (H) 269 (H) 264 (H) 273 (H) 235 (H)     Review of Glycemic Control  Diabetes history: DM2 Outpatient Diabetes medications: Amaryl 2 QAM, Humalog 8-15 TID, Toujeo 15 QHS Current orders for Inpatient glycemic control: Lantus 25 units qd + Novolog correction 0-15 units q4 hrs  Inpatient Diabetes Program Recommendations:  Noted patient was extubated yesterday and currently NPO. Please consider increase in Lantus to 30 units qd.  Thank you, Nani Gasser. Clide Remmers, RN, MSN, CDE Inpatient Glycemic Control Team Team Pager [phone] (8am-5pm) 08/21/2015 11:46 AM

## 2015-08-21 NOTE — Progress Notes (Signed)
MD notified of frequent BM and not tested for c diff.  MD not concerned.  No new orders at this time.

## 2015-08-21 NOTE — Progress Notes (Signed)
    [facility] has been requested to perform a TEE/DCCT on [name] for Aflutter.  After careful review of history and examination, the risks and benefits of transesophageal echocardiogram have been explained including risks of esophageal damage, perforation (1:10,000 risk), bleeding, pharyngeal hematoma as well as other potential complications associated with conscious sedation including aspiration, arrhythmia, respiratory failure and death. Alternatives to treatment were discussed, questions were answered. Wife and the patient's sons girl friend Apolinar Junes) who is RN - updated. Both willing to proceeded with TEE/DCCV.  Scheduled tomorrow @ 9am.  NPO after midnight. Meds with sips.   Caelen Reierson, PA-C 08/21/2015 9:08 AM

## 2015-08-21 NOTE — Evaluation (Signed)
Clinical/Bedside Swallow Evaluation Patient Details  Name: Frank Hoover MRN: [id number] Date of Birth: November 30, 1950  Today's Date: 08/21/2015 Time: SLP Start Time (ACUTE ONLY): 67 SLP Stop Time (ACUTE ONLY): 0941 SLP Time Calculation (min) (ACUTE ONLY): 21 min  Past Medical History:  Past Medical History  Diagnosis Date  . Gout   . COPD (chronic obstructive pulmonary disease) ([place])   . Type 2 diabetes mellitus ([place])   . GERD (gastroesophageal reflux disease)   . Essential hypertension   . Hyperlipemia   . OA (osteoarthritis)   . Gastroparesis   . Adenomatous colon polyp 10/30/09    Serrated adenoma removed during Colonoscopy   . Diverticulosis of colon   . CKD (chronic kidney disease) stage 3, GFR 30-59 ml/min    Past Surgical History:  Past Surgical History  Procedure Laterality Date  . Tonsillectomy    . Cataract extraction Right   . Esophagogastroduodenoscopy  10/30/09    [id number]   . Colonoscopy  10/30/09    [id number] diverticulum/serrated adenoma from ICV, next colonoscopy due 10/2012  . Colonoscopy N/A 09/18/2012    [id number] mucosa that was seen appeared normal, however most of it was not seen due to be very poor prep  . Colonoscopy N/A 04/08/2013    [id number] polyp removed/inadequate preparation  . Colonoscopy N/A 06/22/2014    Procedure: COLONOSCOPY;  Surgeon: Daneil Dolin, MD;  Location: [facility];  Service: Endoscopy;  Laterality: N/A;  115    HPI:  65 year old male with PMH of GERD, OSA, COPD, admitted with dizziness, tachycardia and leg edema. Dx with acute cardiogenic/septic shock, acute diastolic CHF, A-flutter, intubated 6/13-6/18.    Assessment / Plan / Recommendation Clinical Impression  Patient presents with a functional oropharyngeal swallow with intact but delayed mastication of soft solids due to missing dentition but with timely appearing swallow initiation and no overt s/s of aspiration. Intermittent gagging noted with solids  reportedly due to taste. RN to call wife to bring in dentures. Will f/u x 1 for diet tolerance given 5 day intubation and potential to advance solids with dentures.     Aspiration Risk  Mild aspiration risk    Diet Recommendation Dysphagia 2 (Fine chop);Thin liquid   Liquid Administration via: Cup;Straw Medication Administration: Whole meds with liquid Supervision: Patient able to self feed;Intermittent supervision to cue for compensatory strategies Compensations: Slow rate;Small sips/bites Postural Changes: Seated upright at 90 degrees;Remain upright for at least 30 minutes after po intake    Other  Recommendations Oral Care Recommendations: Oral care BID   Follow up Recommendations  None    Frequency and Duration min 1 x/week  1 week       Prognosis Prognosis for Safe Diet Advancement: Good      Swallow Study   General HPI: 65 year old male with PMH of GERD, OSA, COPD, admitted with dizziness, tachycardia and leg edema. Dx with acute cardiogenic/septic shock, acute diastolic CHF, A-flutter, intubated 6/13-6/18.  Type of Study: Bedside Swallow Evaluation Previous Swallow Assessment: none noted Diet Prior to this Study: NPO Temperature Spikes Noted: No Respiratory Status: Nasal cannula History of Recent Intubation: Yes Length of Intubations (days): 5 days Date extubated: 08/20/15 Behavior/Cognition: Alert;Cooperative;Pleasant mood Oral Cavity Assessment: Within Functional Limits Oral Care Completed by SLP: Recent completion by staff Oral Cavity - Dentition: Dentures, not available Vision: Functional for self-feeding Self-Feeding Abilities: Needs assist Patient Positioning: Upright in bed Baseline Vocal Quality: Normal Volitional Cough: Strong Volitional Swallow: Able to elicit  Oral/Motor/Sensory Function Overall Oral Motor/Sensory Function: Within functional limits   Ice Chips Ice chips: Within functional limits Presentation: Spoon   Thin Liquid Thin Liquid:  Within functional limits Presentation: Cup;Straw    Nectar Thick Nectar Thick Liquid: Not tested   Honey Thick Honey Thick Liquid: Not tested   Puree Puree: Within functional limits (except gagging)   Solid   GO  Frank Thebeau MA, CCC-SLP [phone]  Solid: Impaired Oral Phase Impairments: Other (comment) (prolonged mastication due to missing dentition)        Frank Hoover Frank Hoover 08/21/2015,10:12 AM

## 2015-08-21 NOTE — Progress Notes (Addendum)
PULMONARY / CRITICAL CARE MEDICINE   Name: Frank Hoover MRN: [id number] DOB: 1950-04-06    ADMISSION DATE:  08/14/2015 CONSULTATION DATE:  08/15/2015  REFERRING MD:  Dr. Tamala Julian  CHIEF COMPLAINT:  Dizziness  SUBJECTIVE:  No overnight events. Still having some loose stools. Extubated yesterday.   VITAL SIGNS: BP 190/83 mmHg  Pulse 122  Temp(Src) 99.1 F (37.3 C) (Oral)  Resp 13  Ht 5' 6"  (1.676 m)  Wt 268 lb 11.9 oz (121.9 kg)  BMI 43.40 kg/m2  SpO2 99%  HEMODYNAMICS: CVP:  [7 mmHg] 7 mmHg  VENTILATOR SETTINGS: Vent Mode:  [-]  FiO2 (%):  [40 %] 40 %  INTAKE / OUTPUT: I/O last 3 completed shifts: In: 2807.4 [P.O.:220; I.V.:1542; NG/GT:545.3; IV Piggyback:500] Out: 7000 [MVHQI:6962; Emesis/NG output:650; Stool:200]  PHYSICAL EXAMINATION: General: Awake, alert, slightly lethargic. No distress.  Neuro:  Follows commands. Moves all extremities spontaneously.  HEENT: EOMI. Left eye with mild dysconjugate gaze. MMM.  Cardiovascular: Tachycardic, regular. No murmur.  Lungs: Lungs clear bilaterally. No wheezes.  Abdomen:  Soft, non-tender, non-distended. BS + Musculoskeletal: +1 pitting edema in LE's bilaterally. Trace edema in UE's, R>L.  Skin:  No rashes  LABS:  BMET  Recent Labs Lab 08/19/15 0438 08/20/15 0430 08/21/15 0430  NA 136 139 144  K 3.3* 3.4* 3.2*  CL 98* 101 105  CO2 24 25 27   BUN 79* 77* 77*  CREATININE 4.66* 3.85* 3.25*  GLUCOSE 150* 256* 281*   Electrolytes  Recent Labs Lab 08/18/15 1145 08/19/15 0438 08/20/15 0430 08/20/15 1335 08/21/15 0430  CALCIUM 6.8* 7.4* 8.3*  --  8.6*  MG  --   --  2.5* 2.5* 2.3  PHOS 5.7*  --  4.2  --  3.1    CBC  Recent Labs Lab 08/20/15 0430 08/20/15 1800 08/21/15 0430  WBC 6.6 8.0 10.2  HGB 9.4* 9.8* 9.8*  HCT 29.4* 30.1* 31.4*  PLT 88* 115* 148*   Coag's  Recent Labs Lab 08/14/15 1601  INR 1.12   Sepsis Markers  Recent Labs Lab 08/14/15 2340 08/15/15 0202 08/17/15 1630  08/17/15 2027  LATICACIDVEN 5.1* 4.7* 2.6* 2.2*  PROCALCITON 1.42  --   --   --    ABG  Recent Labs Lab 08/18/15 0319 08/19/15 0350 08/20/15 0340  PHART 7.404 7.341* 7.406  PCO2ART 39.4 47.2* 42.3  PO2ART 153* 97.9 86.9   Liver Enzymes  Recent Labs Lab 08/15/15 0422 08/17/15 0438 08/18/15 1145  AST 44* 109*  --   ALT 24 89*  --   ALKPHOS 64 80  --   BILITOT 0.7 1.1  --   ALBUMIN 3.4* 1.8* 1.5*   Cardiac Enzymes  Recent Labs Lab 08/15/15 1630 08/15/15 2230 08/16/15 0310  TROPONINI 0.23* 0.26* 0.28*   Glucose  Recent Labs Lab 08/20/15 1131 08/20/15 1621 08/20/15 2128 08/20/15 2348 08/21/15 0410 08/21/15 0813  GLUCAP 345* 312* 269* 264* 273* 235*   Imaging No results found. STUDIES:  6/12 ECHO >> EF 25% 6/13 Renal US >> Mild R renal atrophy 6/14 CT abd/pelvis >> Distal proctocolitis  CULTURES: 6/12 Blood >> Citrobacter koseri 6/12 Urine >> Citrobacter koseri  ANTIBIOTICS: 6/12 Vancomycin >> 6/14 6/12 Zosyn >> 6/16 6/13 Cefepime >> 6/14 6/16 Fortaz >> 6/17 6/17 Rocephin >>>  SIGNIFICANT EVENTS: 6/12 Admit to [facility] 6/13 Transfer to [name], cardiology/urology consulted 6/18 Extubated  LINES/TUBES: 6/13 R Femoral CVL >> 6/13 6/13 ETT >> 6/18 6/13 Lt radial aline >> 6/17 6/13 RIJ CVL >>>plan out  6/19  DISCUSSION: 65 yo male presented to [facility] with dizziness, tachycardia and leg edema.  Found to have atrial flutter w/ EF 25%.  ASSESSMENT / PLAN:  PULMONARY A: Acute hypoxic respiratory failure, resolved H/o OSA H/o COPD P:   Titrate O2 for sat of 88-92% Duoneb prn  CARDIOVASCULAR A:  Septic shock, resolved Chronic systolic CHF A fib/flutter with RVR  H/o HTN P:  Amiodarone per cardiology Continue Metoprolol 50 mg bid Heparin restarted Plan for TEE/DCCV in AM (08/22/15) Continue Lasix 80 mg IV bid  Hydralazine prn  RENAL A:   Acute on Chronic Renal Failure (baseline Cr 1.4). Improving Urethral false passage s/p cystoscopy by  urology 6/13 Hypokalemia P:   BMP in AM Lasix as above BMET in AM Replace K  GASTROINTESTINAL A:   Colitis; improving Loose stools P:   Abx as below Start diet  HEMATOLOGIC A:   Anemia of critical illness Thrombocytopenia; resolved HIT negative P:  F/u CBC Continue Heparin  INFECTIOUS A:   Septic Shock 2/2 Citrobacter; resolved UTI Colitis P:   Continue Rocephin   ENDOCRINE A:   DM type II P:   SSI  NEUROLOGIC A:   Acute metabolic encephalopathy; improving P:   Continue to monitor   Natasha Bence, MD PGY-3, Internal Medicine Pager: [phone]   STAFF NOTE: Linwood Dibbles, MD FACP have personally reviewed patient's available data, including medical history, events of note, physical examination and test results as part of my evaluation. I have discussed with resident/NP and other care providers such as pharmacist, RN and RRT. In addition, I personally evaluated patient and elicited key findings of: awake, no distress without ett, lungs clear, noted bacteremia citrobacter, will treat 8 days ceftriaxone in total abx courses, for slp, dc lien neck asap, foley per urology, cards managing flutter, cardioversion planned, can go to sdu, off to triad, crt is trending down with current neg balance noted, chem repeat needed, supp k, BB increase remains tachy / HTN, avoid hydral with tachy   Lavon Paganini. Titus Mould, MD, [place] Pgr: [facility] 08/21/2015 11:26 AM

## 2015-08-21 NOTE — Care Management Important Message (Signed)
Important Message  Patient Details  Name: Frank Hoover MRN: [id number] Date of Birth: 18-Nov-1950   Medicare Important Message Given:  Yes    Loann Quill 08/21/2015, 10:01 AM

## 2015-08-21 NOTE — Progress Notes (Signed)
ANTICOAGULATION CONSULT NOTE - Follow Up Consult  Pharmacy Consult for Heparin Indication: atrial fibrillation  No Known Allergies  Patient Measurements: Height: 5\' 6"  (167.6 cm) Weight: 268 lb 11.9 oz (121.9 kg) IBW/kg (Calculated) : 63.8 Heparin Dosing Weight: 94 kg  Vital Signs: Temp: 98.4 F (36.9 C) (06/19 2000) Temp Source: Oral (06/19 2000) BP: 126/80 mmHg (06/19 2000) Pulse Rate: 123 (06/19 2000)  Labs:  Recent Labs  08/19/15 0438  08/20/15 0430  08/20/15 1800 08/21/15 0430 08/21/15 1259 08/21/15 2122  HGB  --   < > 9.4*  --  9.8* 9.8*  --   --   HCT  --   < > 29.4*  --  30.1* 31.4*  --   --   PLT  --   < > 88*  --  115* 148*  --   --   HEPARINUNFRC  --   --   --   < > 0.20* 0.22* 0.33 0.30  CREATININE 4.66*  --  3.85*  --   --  3.25*  --   --   < > = values in this interval not displayed.  Estimated Creatinine Clearance: 28.6 mL/min (by C-G formula based on Cr of 3.25).  Assessment: 54 YOM admitted 08/14/2015 with newly diagnosed AFlutter with RVR. Pharmacy consulted to dose heparin. Confirmatory Heparin level low-end therapeutic on 1600 units/hr. CBC stable. No issues with line or bleeding reported per RN.  Goal of Therapy:  Heparin level 0.3-0.7 units/ml Monitor platelets by anticoagulation protocol: Yes   Plan:  Increase heparin rate slightly to 1700 units/hr Daily heparin level and CBC. F/u plans for oral anticoagulation eventually.  Maryanna Shape, PharmD, BCPS  Clinical Pharmacist  Pager: [phone]    08/21/2015 9:54 PM

## 2015-08-22 ENCOUNTER — Inpatient Hospital Stay (HOSPITAL_COMMUNITY): Payer: Medicare Other | Admitting: Anesthesiology

## 2015-08-22 ENCOUNTER — Encounter (HOSPITAL_COMMUNITY)
Admission: EM | Disposition: A | Discharge status: Skilled Nursing Facility | Payer: Self-pay | Source: Home / Self Care | Attending: Internal Medicine

## 2015-08-22 ENCOUNTER — Inpatient Hospital Stay (HOSPITAL_COMMUNITY): Payer: Medicare Other

## 2015-08-22 ENCOUNTER — Encounter (HOSPITAL_COMMUNITY): Payer: Self-pay

## 2015-08-22 DIAGNOSIS — I4892 Unspecified atrial flutter: Secondary | ICD-10-CM

## 2015-08-22 HISTORY — PX: CARDIOVERSION: SHX1299

## 2015-08-22 HISTORY — PX: TEE WITHOUT CARDIOVERSION: SHX5443

## 2015-08-22 LAB — GLUCOSE, CAPILLARY
Glucose-Capillary: 251 mg/dL — ABNORMAL HIGH (ref 65–99)
Glucose-Capillary: 234 mg/dL — ABNORMAL HIGH (ref 65–99)
Glucose-Capillary: 292 mg/dL — ABNORMAL HIGH (ref 65–99)

## 2015-08-22 LAB — BASIC METABOLIC PANEL
Anion gap: 6 (ref 5–15)
BUN: 74 mg/dL — ABNORMAL HIGH (ref 6–20)
CO2: 28 mmol/L (ref 22–32)
Calcium: 8.2 mg/dL — ABNORMAL LOW (ref 8.9–10.3)
Chloride: 104 mmol/L (ref 101–111)
Creatinine, Ser: 3.13 mg/dL — ABNORMAL HIGH (ref 0.61–1.24)
GFR calc Af Amer: 23 mL/min — ABNORMAL LOW (ref >60)
GFR calc non Af Amer: 20 mL/min — ABNORMAL LOW (ref >60)
Glucose, Bld: 263 mg/dL — ABNORMAL HIGH (ref 65–99)
Potassium: 4.7 mmol/L (ref 3.5–5.1)
Sodium: 138 mmol/L (ref 135–145)

## 2015-08-22 LAB — HEPARIN LEVEL (UNFRACTIONATED): Heparin Unfractionated: 0.53 IU/mL (ref 0.30–0.70)

## 2015-08-22 SURGERY — ECHOCARDIOGRAM, TRANSESOPHAGEAL
Anesthesia: Monitor Anesthesia Care

## 2015-08-22 MED ORDER — FUROSEMIDE 40 MG PO TABS
40.0000 mg | ORAL_TABLET | Freq: Two times a day (BID) | ORAL | Status: DC
  Administered 2015-08-22 – 2015-08-23 (×4): 40 mg via ORAL
  Filled 2015-08-22 (×4): qty 1

## 2015-08-22 MED ORDER — PROPOFOL 500 MG/50ML IV EMUL
INTRAVENOUS | Status: DC | PRN
  Administered 2015-08-22: 100 ug/kg/min via INTRAVENOUS

## 2015-08-22 MED ORDER — PROPOFOL 10 MG/ML IV BOLUS
INTRAVENOUS | Status: DC | PRN
  Administered 2015-08-22 (×2): 10 mg via INTRAVENOUS
  Administered 2015-08-22: 15 mg via INTRAVENOUS

## 2015-08-22 MED ORDER — LIDOCAINE HCL (CARDIAC) 20 MG/ML IV SOLN
INTRAVENOUS | Status: DC | PRN
  Administered 2015-08-22: 100 mg via INTRATRACHEAL

## 2015-08-22 MED ORDER — AMIODARONE HCL 200 MG PO TABS
400.0000 mg | ORAL_TABLET | Freq: Two times a day (BID) | ORAL | Status: DC
  Administered 2015-08-22 – 2015-08-27 (×9): 400 mg via ORAL
  Filled 2015-08-22 (×10): qty 2

## 2015-08-22 MED ORDER — PANTOPRAZOLE SODIUM 40 MG PO TBEC
40.0000 mg | DELAYED_RELEASE_TABLET | Freq: Every day | ORAL | Status: DC
  Administered 2015-08-22 – 2015-09-08 (×17): 40 mg via ORAL
  Filled 2015-08-22 (×17): qty 1

## 2015-08-22 MED ORDER — SODIUM CHLORIDE 0.9 % IV SOLN
INTRAVENOUS | Status: DC
Start: 2015-08-22 — End: 2015-08-22

## 2015-08-22 MED ORDER — SODIUM CHLORIDE 0.9 % IV SOLN
250.0000 mL | INTRAVENOUS | Status: DC
  Administered 2015-08-22: 09:00:00 via INTRAVENOUS

## 2015-08-22 MED ORDER — SODIUM CHLORIDE 0.9% FLUSH: 3.0000 mL | INTRAVENOUS | Status: DC | PRN

## 2015-08-22 MED ORDER — BUTAMBEN-TETRACAINE-BENZOCAINE 2-2-14 % EX AERO
INHALATION_SPRAY | CUTANEOUS | Status: DC | PRN
  Administered 2015-08-22: 2 via TOPICAL

## 2015-08-22 MED ORDER — SODIUM CHLORIDE 0.9% FLUSH
3.0000 mL | Freq: Two times a day (BID) | INTRAVENOUS | Status: DC
  Administered 2015-08-22 – 2015-08-30 (×14): 3 mL via INTRAVENOUS

## 2015-08-22 NOTE — H&P (View-Only) (Signed)
TELEMETRY: Reviewed telemetry pt in atrial flutter with RVR- rate 120-125: Filed Vitals:   08/21/15 2300 08/22/15 0000 08/22/15 0400 08/22/15 0429  BP: 144/76 144/76 117/84 117/84  Pulse:    113  Temp: 98.9 F (37.2 C)   98.9 F (37.2 C)  TempSrc: Oral   Oral  Resp: _0 Height:      Weight:    268 lb 1.6 oz (121.609 kg)  SpO2: 96% 96% 93% 97%    Intake/Output Summary (Last 24 hours) at 08/22/15 0734 Last data filed at 08/22/15 0600  Gross per 24 hour  Intake 1745.63 ml  Output   3100 ml  Net -1354.37 ml   Filed Weights   08/20/15 0310 08/21/15 0407 08/22/15 0429  Weight: 280 lb 3.3 oz (127.1 kg) 268 lb 11.9 oz (121.9 kg) 268 lb 1.6 oz (121.609 kg)    Subjective Patient denies any pain or SOB. Now on room air.   Marland Kitchen amiodarone  400 mg Oral BID  . cefTRIAXone (ROCEPHIN)  IV  2 g Intravenous Q24H  . chlorhexidine gluconate (SAGE KIT)  15 mL Mouth Rinse BID  . cloNIDine  0.2 mg Oral TID  . feeding supplement (GLUCERNA SHAKE)  237 mL Oral BID BM  . furosemide  40 mg Oral BID  . insulin aspart  0-15 Units Subcutaneous TID WC  . insulin glargine  25 Units Subcutaneous Daily  . pantoprazole  40 mg Oral Daily   . sodium chloride Stopped (08/21/15 1617)  . dexmedetomidine Stopped (08/18/15 0800)  . heparin 1,700 Units/hr (08/22/15 0600)    LABS: Basic Metabolic Panel:  Recent Labs  08/20/15 0430 08/20/15 1335 08/21/15 0430  NA 139  --  144  K 3.4*  --  3.2*  CL 101  --  105  CO2 25  --  27  GLUCOSE 256*  --  281*  BUN 77*  --  77*  CREATININE 3.85*  --  3.25*  CALCIUM 8.3*  --  8.6*  MG 2.5* 2.5* 2.3  PHOS 4.2  --  3.1   Liver Function Tests: No results for input(s): AST, ALT, ALKPHOS, BILITOT, PROT, ALBUMIN in the last 72 hours. No results for input(s): LIPASE, AMYLASE in the last 72 hours. CBC:  Recent Labs  08/20/15 1800 08/21/15 0430  WBC 8.0 10.2  HGB 9.8* 9.8*  HCT 30.1* 31.4*  MCV 85.3 86.5  PLT 115* 148*   Cardiac Enzymes: No  results for input(s): CKTOTAL, CKMB, CKMBINDEX, TROPONINI in the last 72 hours. BNP: No results for input(s): PROBNP in the last 72 hours. D-Dimer: No results for input(s): DDIMER in the last 72 hours. Hemoglobin A1C: No results for input(s): HGBA1C in the last 72 hours. Fasting Lipid Panel: No results for input(s): CHOL, HDL, LDLCALC, TRIG, CHOLHDL, LDLDIRECT in the last 72 hours. Thyroid Function Tests: No results for input(s): TSH, T4TOTAL, T3FREE, THYROIDAB in the last 72 hours.  Invalid input(s): FREET3   Radiology/Studies:  No results found.  PHYSICAL EXAM General: Well developed, obese BM, in no acute distress. Head: Normocephalic, atraumatic, sclera non-icteric, oropharynx is clear Neck: Negative for carotid bruits. JVD is not elevated. No adenopathy Lungs: Clear anteriorly.  Breathing is unlabored. Heart: IRRR, tachy,  S1 S2 without murmurs, rubs, or gallops.  Abdomen: Soft, non-tender, non-distended with normoactive bowel sounds. No hepatomegaly. No rebound/guarding. No obvious abdominal masses. Extremities: 1+ bilateral edema.  Distal pedal pulses are 2+ and equal bilaterally. Neuro: Alert and oriented X 3.  Moves all extremities spontaneously. Psych:  Responds to questions appropriately with a normal affect.  ASSESSMENT AND PLAN: 1. Acute cardiogenic/septic shock. Clinically improved. Now on room air.  Renal function pending this am.  On IV antibiotics per critical care.  2. Acute systolic CHF. EF 25% by Echo. Cardiac enzymes mildly elevated with flat trend on 6/12-6/14. Suspect more demand ischemia but status of coronaries is unknown. Other potential etiologies include tachycardia mediated CM, sepsis, ischemia, ? Toxins. Diuresing well. I/O negative 1.5 liters yesterday. Weight down 12 lbs. Still some edematous. Will switch lasix to po today. On beta blocker. Not a candidate for ACEi/ARB/Aldactone due to ARF.  3. Atrial flutter with RVR despite IV amiodarone load and beta  blocker. Fpr TEE guided DCCV today at 9 am. On IV heparin. Will convert IV amiodarone to po. Will need case management to assess ability to afford DOAC. Will continue IV heparin for now. 4. Hypokalemia. Replete IV per CCM. BMET pending today. 5. Thrombocytopenia. Improved even on heparin. HIT very unlikely. 6. DM type 2 7. ARF- good urine output. Renal indices were  improving. Avoid contrast studies for now. BMET pending.    Present on Admission:  . Atrial flutter with rapid ventricular response ([place]) . Hyperlipemia . HTN (hypertension) . GERD . Sleep apnea . Systolic CHF ([place])  Signed, Brinly Maietta [place], [place] 08/22/2015 7:34 AM

## 2015-08-22 NOTE — CV Procedure (Signed)
Procedure: TEE  Indication: Atrial flutter  Sedation: Per anesthesiology.  Findings: The patient was in rapid atrial flutter.  Normal LV size with mild LV hypertrophy.  EF difficult to calculate with tachycardia, estimate around 30%.  Mildly dilated RV with moderately decreased systolic function.  Mild left atrial enlargement with no LA appendage thrombus.  Normal right atrium.  There was a large PFO noted by color doppler and bubble study.  Trivial MR.  Trivial TR, peak RV-RA gradient 21 mmHg.  Trileaflet aortic valve with no stenosis or regurgitation.  Normal caliber aorta with mild plaque in descending thoracic aorta.   Patient may proceed to DCCV.  Would repeat echo in NSR for better assessment of EF.   Frank Hoover 08/22/2015 9:35 AM

## 2015-08-22 NOTE — Progress Notes (Signed)
TELEMETRY: Reviewed telemetry pt in atrial flutter with RVR- rate 120-125: Filed Vitals:   08/21/15 2300 08/22/15 0000 08/22/15 0400 08/22/15 0429  BP: 144/76 144/76 117/84 117/84  Pulse:    113  Temp: 98.9 F (37.2 C)   98.9 F (37.2 C)  TempSrc: Oral   Oral  Resp: _0 Height:      Weight:    268 lb 1.6 oz (121.609 kg)  SpO2: 96% 96% 93% 97%    Intake/Output Summary (Last 24 hours) at 08/22/15 0734 Last data filed at 08/22/15 0600  Gross per 24 hour  Intake 1745.63 ml  Output   3100 ml  Net -1354.37 ml   Filed Weights   08/20/15 0310 08/21/15 0407 08/22/15 0429  Weight: 280 lb 3.3 oz (127.1 kg) 268 lb 11.9 oz (121.9 kg) 268 lb 1.6 oz (121.609 kg)    Subjective Patient denies any pain or SOB. Now on room air.   Marland Kitchen amiodarone  400 mg Oral BID  . cefTRIAXone (ROCEPHIN)  IV  2 g Intravenous Q24H  . chlorhexidine gluconate (SAGE KIT)  15 mL Mouth Rinse BID  . cloNIDine  0.2 mg Oral TID  . feeding supplement (GLUCERNA SHAKE)  237 mL Oral BID BM  . furosemide  40 mg Oral BID  . insulin aspart  0-15 Units Subcutaneous TID WC  . insulin glargine  25 Units Subcutaneous Daily  . pantoprazole  40 mg Oral Daily   . sodium chloride Stopped (08/21/15 1617)  . dexmedetomidine Stopped (08/18/15 0800)  . heparin 1,700 Units/hr (08/22/15 0600)    LABS: Basic Metabolic Panel:  Recent Labs  08/20/15 0430 08/20/15 1335 08/21/15 0430  NA 139  --  144  K 3.4*  --  3.2*  CL 101  --  105  CO2 25  --  27  GLUCOSE 256*  --  281*  BUN 77*  --  77*  CREATININE 3.85*  --  3.25*  CALCIUM 8.3*  --  8.6*  MG 2.5* 2.5* 2.3  PHOS 4.2  --  3.1   Liver Function Tests: No results for input(s): AST, ALT, ALKPHOS, BILITOT, PROT, ALBUMIN in the last 72 hours. No results for input(s): LIPASE, AMYLASE in the last 72 hours. CBC:  Recent Labs  08/20/15 1800 08/21/15 0430  WBC 8.0 10.2  HGB 9.8* 9.8*  HCT 30.1* 31.4*  MCV 85.3 86.5  PLT 115* 148*   Cardiac Enzymes: No  results for input(s): CKTOTAL, CKMB, CKMBINDEX, TROPONINI in the last 72 hours. BNP: No results for input(s): PROBNP in the last 72 hours. D-Dimer: No results for input(s): DDIMER in the last 72 hours. Hemoglobin A1C: No results for input(s): HGBA1C in the last 72 hours. Fasting Lipid Panel: No results for input(s): CHOL, HDL, LDLCALC, TRIG, CHOLHDL, LDLDIRECT in the last 72 hours. Thyroid Function Tests: No results for input(s): TSH, T4TOTAL, T3FREE, THYROIDAB in the last 72 hours.  Invalid input(s): FREET3   Radiology/Studies:  No results found.  PHYSICAL EXAM General: Well developed, obese BM, in no acute distress. Head: Normocephalic, atraumatic, sclera non-icteric, oropharynx is clear Neck: Negative for carotid bruits. JVD is not elevated. No adenopathy Lungs: Clear anteriorly.  Breathing is unlabored. Heart: IRRR, tachy,  S1 S2 without murmurs, rubs, or gallops.  Abdomen: Soft, non-tender, non-distended with normoactive bowel sounds. No hepatomegaly. No rebound/guarding. No obvious abdominal masses. Extremities: 1+ bilateral edema.  Distal pedal pulses are 2+ and equal bilaterally. Neuro: Alert and oriented X 3.  Moves all extremities spontaneously. Psych:  Responds to questions appropriately with a normal affect.  ASSESSMENT AND PLAN: 1. Acute cardiogenic/septic shock. Clinically improved. Now on room air.  Renal function pending this am.  On IV antibiotics per critical care.  2. Acute systolic CHF. EF 25% by Echo. Cardiac enzymes mildly elevated with flat trend on 6/12-6/14. Suspect more demand ischemia but status of coronaries is unknown. Other potential etiologies include tachycardia mediated CM, sepsis, ischemia, ? Toxins. Diuresing well. I/O negative 1.5 liters yesterday. Weight down 12 lbs. Still some edematous. Will switch lasix to po today. On beta blocker. Not a candidate for ACEi/ARB/Aldactone due to ARF.  3. Atrial flutter with RVR despite IV amiodarone load and beta  blocker. Fpr TEE guided DCCV today at 9 am. On IV heparin. Will convert IV amiodarone to po. Will need case management to assess ability to afford DOAC. Will continue IV heparin for now. 4. Hypokalemia. Replete IV per CCM. BMET pending today. 5. Thrombocytopenia. Improved even on heparin. HIT very unlikely. 6. DM type 2 7. ARF- good urine output. Renal indices were  improving. Avoid contrast studies for now. BMET pending.    Present on Admission:  . Atrial flutter with rapid ventricular response ([place]) . Hyperlipemia . HTN (hypertension) . GERD . Sleep apnea . Systolic CHF ([place])  Signed, Natsumi Whitsitt [place], [place] 08/22/2015 7:34 AM

## 2015-08-22 NOTE — Transfer of Care (Signed)
Immediate Anesthesia Transfer of Care Note  Patient: Frank Hoover  Procedure(s) Performed: Procedure(s): TRANSESOPHAGEAL ECHOCARDIOGRAM (TEE) (N/A) CARDIOVERSION (N/A)  Patient Location: PACU  Anesthesia Type:MAC  Level of Consciousness: awake, alert  and patient cooperative  Airway & Oxygen Therapy: Patient Spontanous Breathing and Patient connected to nasal cannula oxygen  Post-op Assessment: Report given to RN and Post -op Vital signs reviewed and stable  Post vital signs: Reviewed and stable  Last Vitals:  Filed Vitals:   08/22/15 0753 08/22/15 0803  BP:  126/80  Pulse: 117 120  Temp:  37.2 C  Resp:  21    Last Pain:  Filed Vitals:   08/22/15 0806  PainSc: 4          Complications: No apparent anesthesia complications

## 2015-08-22 NOTE — Progress Notes (Signed)
ANTICOAGULATION CONSULT NOTE - Follow Up Consult  Pharmacy Consult for Heparin Indication: atrial fibrillation  No Known Allergies  Patient Measurements: Height: 5\' 6"  (167.6 cm) Weight: 268 lb 1.6 oz (121.609 kg) IBW/kg (Calculated) : 63.8 Heparin Dosing Weight: 94 kg  Vital Signs: Temp: 97.9 F (36.6 C) (06/20 1214) Temp Source: Oral (06/20 1214) BP: 108/45 mmHg (06/20 1315) Pulse Rate: 71 (06/20 1315)  Labs:  Recent Labs  08/20/15 0430  08/20/15 1800 08/21/15 0430 08/21/15 1259 08/21/15 2122 08/22/15 0420 08/22/15 1030  HGB 9.4*  --  9.8* 9.8*  --   --   --   --   HCT 29.4*  --  30.1* 31.4*  --   --   --   --   PLT 88*  --  115* 148*  --   --   --   --   HEPARINUNFRC  --   < > 0.20* 0.22* 0.33 0.30 0.53  --   CREATININE 3.85*  --   --  3.25*  --   --   --  3.13*  < > = values in this interval not displayed.  Estimated Creatinine Clearance: 29.7 mL/min (by C-G formula based on Cr of 3.13).  . sodium chloride    . sodium chloride Stopped (08/21/15 1617)  . dexmedetomidine Stopped (08/18/15 0800)  . heparin 1,700 Units/hr (08/22/15 1151)    Assessment: 31 YOM admitted 08/14/2015 with newly diagnosed AFlutter with RVR. Pharmacy consulted to dose heparin. Heparin levels remain in goal range on heparin at 1700 units/hr. CBC stable. No issues with line or bleeding reported per RN.  Goal of Therapy:  Heparin level 0.3-0.7 units/ml Monitor platelets by anticoagulation protocol: Yes   Plan:  Continue IV heparin at current rate. MD considering conversion to DOAC if patient can afford. Daily heparin levels and CBC for now.  Uvaldo Rising, BCPS  Clinical Pharmacist Pager [phone]  08/22/2015 1:37 PM

## 2015-08-22 NOTE — Progress Notes (Signed)
PROGRESS NOTE    Frank Hoover  IRW:[id number] DOB: Jul 08, 1950 DOA: 08/14/2015 PCP: Alonza Bogus, MD   Outpatient Specialists:     Brief Narrative:  65 yo male presented to [facility] with dizziness, tachycardia and leg edema on 6/12. Found to have atrial flutter w/ EF 25%.  Found to have both cardiogenic and septic shock.  Much improved and was transferred to [facility] on 6/20.     Assessment & Plan:   Principal Problem:   Atrial flutter with rapid ventricular response (HCC) Active Problems:   GERD   DM (diabetes mellitus) ([place])   HTN (hypertension)   Hyperlipemia   Sleep apnea   Systolic CHF ([place])   Acute respiratory failure (HCC)   Septic shock (HCC)   Acute encephalopathy   Acute kidney injury ([place])   Acute systolic heart failure ([place])   Acute hypoxic resp failure -extubated -Bipap Qhs for sleep apnea -4L [place]   Septic shock with citrobacter bacteremia 8 days of ceftriaxone  Cardiogenic shock -resolve  Acute systolic CHF. EF 25%  -diuresing IV lasix per cards  Atrial fib -s/p cardioversion -currently in sinus -appreciate cardiology -IV heparin  HTN -diursing  AKI on CKD  -monitor Cr as IV lasix being given  Anemia of CD -transfuse for < 9  DM  SSI  Urinary retention -foley per urology  Acute metabolic encephalopathy -awake today   DVT prophylaxis:  Heparin gtt  Code Status: Full Code   Family Communication: Wife and CNA at bedside  Disposition Plan:  Remain in SDU   Consultants:   Cards  PCCM  urology  Procedures:        Subjective: Feeling better In NSR after cardioversion  Objective: Filed Vitals:   08/22/15 1045 08/22/15 1100 08/22/15 1115 08/22/15 1214  BP: 126/54 134/56 131/55 111/66  Pulse: 76   79  Temp:    97.9 F (36.6 C)  TempSrc:    Oral  Resp: 20 18 21 19   Height:      Weight:      SpO2: 97% 98% 96% 96%    Intake/Output Summary (Last 24 hours) at 08/22/15 1249 Last data filed at 08/22/15  1151  Gross per 24 hour  Intake 1610.83 ml  Output   1790 ml  Net -179.17 ml   Filed Weights   08/20/15 0310 08/21/15 0407 08/22/15 0429  Weight: 127.1 kg (280 lb 3.3 oz) 121.9 kg (268 lb 11.9 oz) 121.609 kg (268 lb 1.6 oz)    Examination:  General exam: sleepy obese male, back from cardioversion  Respiratory system: diminshed, no wheezing, Respiratory effort normal. Cardiovascular system: S1 & S2 heard, RRR. No JVD, murmurs, rubs, gallops or clicks. No pedal edema. Gastrointestinal system: Abdomen is nondistended, soft and nontender, obese. Skin: + edema Psychiatry: sleepy    Data Reviewed: I have personally reviewed following labs and imaging studies  CBC:  Recent Labs Lab 08/15/15 1630  08/18/15 0515 08/19/15 0452 08/20/15 0430 08/20/15 1800 08/21/15 0430  WBC 17.7*  < > 6.6 5.8 6.6 8.0 10.2  NEUTROABS 15.4*  --   --   --   --   --   --   HGB 11.7*  < > 9.1* 9.3* 9.4* 9.8* 9.8*  HCT 36.1*  < > 27.2* 28.4* 29.4* 30.1* 31.4*  MCV 90.5  < > 85.5 85.0 85.0 85.3 86.5  PLT 201  < > 70* 66* 88* 115* 148*  < > = values in this interval not displayed. Basic Metabolic Panel:  Recent Labs Lab 08/18/15 1145 08/19/15 0438 08/20/15 0430 08/20/15 1335 08/21/15 0430 08/22/15 1030  NA 134* 136 139  --  144 138  K 3.5 3.3* 3.4*  --  3.2* 4.7  CL 97* 98* 101  --  105 104  CO2 25 24 25   --  27 28  GLUCOSE 132* 150* 256*  --  281* 263*  BUN 79* 79* 77*  --  77* 74*  CREATININE 5.34* 4.66* 3.85*  --  3.25* 3.13*  CALCIUM 6.8* 7.4* 8.3*  --  8.6* 8.2*  MG  --   --  2.5* 2.5* 2.3  --   PHOS 5.7*  --  4.2  --  3.1  --    GFR: Estimated Creatinine Clearance: 29.7 mL/min (by C-G formula based on Cr of 3.13). Liver Function Tests:  Recent Labs Lab 08/17/15 0438 08/18/15 1145  AST 109*  --   ALT 89*  --   ALKPHOS 80  --   BILITOT 1.1  --   PROT 4.5*  --   ALBUMIN 1.8* 1.5*   No results for input(s): LIPASE, AMYLASE in the last 168 hours. No results for input(s):  AMMONIA in the last 168 hours. Coagulation Profile: No results for input(s): INR, PROTIME in the last 168 hours. Cardiac Enzymes:  Recent Labs Lab 08/15/15 1630 08/15/15 2230 08/16/15 0310  TROPONINI 0.23* 0.26* 0.28*   BNP (last 3 results) No results for input(s): PROBNP in the last 8760 hours. HbA1C: No results for input(s): HGBA1C in the last 72 hours. CBG:  Recent Labs Lab 08/21/15 1232 08/21/15 1246 08/21/15 1706 08/21/15 2057 08/22/15 1216  GLUCAP 361* 325* 377* 320* 251*   Lipid Profile: No results for input(s): CHOL, HDL, LDLCALC, TRIG, CHOLHDL, LDLDIRECT in the last 72 hours. Thyroid Function Tests: No results for input(s): TSH, T4TOTAL, FREET4, T3FREE, THYROIDAB in the last 72 hours. Anemia Panel: No results for input(s): VITAMINB12, FOLATE, FERRITIN, TIBC, IRON, RETICCTPCT in the last 72 hours. Urine analysis:    Component Value Date/Time   COLORURINE AMBER* 08/14/2015 2125   APPEARANCEUR HAZY* 08/14/2015 2125   LABSPEC 1.015 08/14/2015 2125   PHURINE 5.0 08/14/2015 2125   GLUCOSEU 100* 08/14/2015 2125   HGBUR LARGE* 08/14/2015 2125   BILIRUBINUR NEGATIVE 08/14/2015 2125   KETONESUR NEGATIVE 08/14/2015 2125   PROTEINUR 100* 08/14/2015 2125   UROBILINOGEN 0.2 12/02/2013 1739   NITRITE NEGATIVE 08/14/2015 2125   LEUKOCYTESUR MODERATE* 08/14/2015 2125   Sepsis Labs: @LABRCNTIP (procalcitonin:4,lacticidven:4)  ) Recent Results (from the past 240 hour(s))  MRSA PCR Screening     Status: None   Collection Time: 08/14/15  9:25 PM  Result Value Ref Range Status   MRSA by PCR NEGATIVE NEGATIVE Final    Comment:        The GeneXpert MRSA Assay (FDA approved for NASAL specimens only), is one component of a comprehensive MRSA colonization surveillance program. It is not intended to diagnose MRSA infection nor to guide or monitor treatment for MRSA infections.   Culture, Urine     Status: Abnormal   Collection Time: 08/14/15  9:25 PM  Result Value  Ref Range Status   Specimen Description URINE, CLEAN CATCH  Final   Special Requests NONE  Final   Culture >=100,000 COLONIES/mL CITROBACTER KOSERI (A)  Final   Report Status 08/17/2015 FINAL  Final   Organism ID, Bacteria CITROBACTER KOSERI (A)  Final      Susceptibility   Citrobacter koseri - MIC*    CEFAZOLIN <=4  SENSITIVE Sensitive     CEFTRIAXONE <=1 SENSITIVE Sensitive     CIPROFLOXACIN <=0.25 SENSITIVE Sensitive     GENTAMICIN <=1 SENSITIVE Sensitive     IMIPENEM <=0.25 SENSITIVE Sensitive     NITROFURANTOIN <=16 SENSITIVE Sensitive     TRIMETH/SULFA <=20 SENSITIVE Sensitive     * >=100,000 COLONIES/mL CITROBACTER KOSERI  Culture, blood (routine x 2)     Status: Abnormal   Collection Time: 08/14/15 11:30 PM  Result Value Ref Range Status   Specimen Description BLOOD RIGHT HAND  Final   Special Requests BOTTLES DRAWN AEROBIC AND ANAEROBIC 4CC EACH  Final   Culture  Setup Time   Final    GRAM NEGATIVE RODS RECOVERED FROM THE AEROBIC BOTTLE Gram Stain Report Called to,Read Back By and Verified With: SCHONEWITZ,L. AT 1406 ON 08/15/2015 BY BAUGHAM,M. Performed at [facility] Organism ID to follow CRITICAL RESULT CALLED TO, READ BACK BY AND VERIFIED WITH: A MEYER PHARMD 1935 08/15/15 A BROWNING GRAM NEGATIVE RODS RECOVERED FROM THE ANAEROBIC BOTTLE  AT [facility]  GRAM STAIN RESULTS PREVIOUSLY CALLED BY [facility]    Culture CITROBACTER KOSERI (A)  Final   Report Status 08/19/2015 FINAL  Final   Organism ID, Bacteria CITROBACTER KOSERI  Final      Susceptibility   Citrobacter koseri - MIC*    CEFAZOLIN <=4 SENSITIVE Sensitive     CEFEPIME <=1 SENSITIVE Sensitive     CEFTAZIDIME <=1 SENSITIVE Sensitive     CEFTRIAXONE <=1 SENSITIVE Sensitive     CIPROFLOXACIN <=0.25 SENSITIVE Sensitive     GENTAMICIN <=1 SENSITIVE Sensitive     IMIPENEM <=0.25 SENSITIVE Sensitive     TRIMETH/SULFA <=20 SENSITIVE Sensitive     PIP/TAZO <=4 SENSITIVE Sensitive     * CITROBACTER KOSERI  Blood  Culture ID Panel (Reflexed)     Status: Abnormal   Collection Time: 08/14/15 11:30 PM  Result Value Ref Range Status   Enterococcus species NOT DETECTED NOT DETECTED Final   Vancomycin resistance NOT DETECTED NOT DETECTED Final   Listeria monocytogenes NOT DETECTED NOT DETECTED Final   Staphylococcus species NOT DETECTED NOT DETECTED Final   Staphylococcus aureus NOT DETECTED NOT DETECTED Final   Methicillin resistance NOT DETECTED NOT DETECTED Final   Streptococcus species NOT DETECTED NOT DETECTED Final   Streptococcus agalactiae NOT DETECTED NOT DETECTED Final   Streptococcus pneumoniae NOT DETECTED NOT DETECTED Final   Streptococcus pyogenes NOT DETECTED NOT DETECTED Final   Acinetobacter baumannii NOT DETECTED NOT DETECTED Final   Enterobacteriaceae species DETECTED (A) NOT DETECTED Final    Comment: CRITICAL RESULT CALLED TO, READ BACK BY AND VERIFIED WITH: A MEYER PHARMD 1935 08/15/15 A BROWNING    Enterobacter cloacae complex NOT DETECTED NOT DETECTED Final   Escherichia coli NOT DETECTED NOT DETECTED Final   Klebsiella oxytoca NOT DETECTED NOT DETECTED Final   Klebsiella pneumoniae NOT DETECTED NOT DETECTED Final   Proteus species NOT DETECTED NOT DETECTED Final   Serratia marcescens NOT DETECTED NOT DETECTED Final   Carbapenem resistance NOT DETECTED NOT DETECTED Final   Haemophilus influenzae NOT DETECTED NOT DETECTED Final   Neisseria meningitidis NOT DETECTED NOT DETECTED Final   Pseudomonas aeruginosa NOT DETECTED NOT DETECTED Final   Candida albicans NOT DETECTED NOT DETECTED Final   Candida glabrata NOT DETECTED NOT DETECTED Final   Candida krusei NOT DETECTED NOT DETECTED Final   Candida parapsilosis NOT DETECTED NOT DETECTED Final   Candida tropicalis NOT DETECTED NOT DETECTED Final    Comment:  Performed at [facility]  Culture, blood (routine x 2)     Status: None   Collection Time: 08/14/15 11:45 PM  Result Value Ref Range Status   Specimen Description  BLOOD LEFT HAND  Final   Special Requests BOTTLES DRAWN AEROBIC AND ANAEROBIC 3CC EACH  Final   Culture NO GROWTH 5 DAYS  Final   Report Status 08/19/2015 FINAL  Final      Anti-infectives    Start     Dose/Rate Route Frequency Ordered Stop   08/20/15 0000  cefTRIAXone (ROCEPHIN) 2 g in dextrose 5 % 50 mL IVPB     2 g 100 mL/hr over 30 Minutes Intravenous Every 24 hours 08/19/15 1112     08/18/15 1100  cefTAZidime (FORTAZ) 2 g in dextrose 5 % 50 mL IVPB  Status:  Discontinued     2 g 100 mL/hr over 30 Minutes Intravenous Every 24 hours 08/18/15 1039 08/19/15 1112   08/17/15 1800  vancomycin (VANCOCIN) 1,500 mg in sodium chloride 0.9 % 500 mL IVPB  Status:  Discontinued     1,500 mg 250 mL/hr over 120 Minutes Intravenous Every 48 hours 08/15/15 1636 08/15/15 1956   08/16/15 2100  ceFEPIme (MAXIPIME) 1 g in dextrose 5 % 50 mL IVPB  Status:  Discontinued     1 g 100 mL/hr over 30 Minutes Intravenous Every 24 hours 08/15/15 1957 08/16/15 1415   08/16/15 2100  ceFEPIme (MAXIPIME) 2 g in dextrose 5 % 50 mL IVPB  Status:  Discontinued     2 g 100 mL/hr over 30 Minutes Intravenous Every 24 hours 08/16/15 1415 08/16/15 1741   08/16/15 1900  piperacillin-tazobactam (ZOSYN) IVPB 2.25 g  Status:  Discontinued     2.25 g 100 mL/hr over 30 Minutes Intravenous Every 8 hours 08/16/15 1809 08/18/15 1014   08/15/15 2100  ceFEPIme (MAXIPIME) 2 g in dextrose 5 % 50 mL IVPB     2 g 100 mL/hr over 30 Minutes Intravenous  Once 08/15/15 1957 08/15/15 2359   08/15/15 1730  vancomycin (VANCOCIN) 2,000 mg in sodium chloride 0.9 % 500 mL IVPB     2,000 mg 250 mL/hr over 120 Minutes Intravenous  Once 08/15/15 1636 08/15/15 2000   08/15/15 0900  piperacillin-tazobactam (ZOSYN) IVPB 3.375 g  Status:  Discontinued     3.375 g 12.5 mL/hr over 240 Minutes Intravenous Every 8 hours 08/15/15 0856 08/15/15 1956   08/15/15 0100  vancomycin (VANCOCIN) IVPB 1000 mg/200 mL premix     1,000 mg 200 mL/hr over 60 Minutes  Intravenous STAT 08/15/15 0048 08/15/15 0206   08/14/15 2330  vancomycin (VANCOCIN) IVPB 1000 mg/200 mL premix     1,000 mg 200 mL/hr over 60 Minutes Intravenous STAT 08/14/15 2316 08/15/15 0041   08/14/15 2330  piperacillin-tazobactam (ZOSYN) IVPB 3.375 g     3.375 g 12.5 mL/hr over 240 Minutes Intravenous STAT 08/14/15 2317 08/15/15 0341       Radiology Studies: No results found.      Scheduled Meds: . amiodarone  400 mg Oral BID  . cefTRIAXone (ROCEPHIN)  IV  2 g Intravenous Q24H  . chlorhexidine gluconate (SAGE KIT)  15 mL Mouth Rinse BID  . cloNIDine  0.2 mg Oral TID  . feeding supplement (GLUCERNA SHAKE)  237 mL Oral BID BM  . furosemide  40 mg Oral BID  . insulin aspart  0-15 Units Subcutaneous TID WC  . insulin glargine  25 Units Subcutaneous Daily  . pantoprazole  40 mg Oral Daily  . sodium chloride flush  3 mL Intravenous Q12H   Continuous Infusions: . sodium chloride    . sodium chloride Stopped (08/21/15 1617)  . dexmedetomidine Stopped (08/18/15 0800)  . heparin 1,700 Units/hr (08/22/15 1151)     LOS: 8 days    Time spent: 35 min    [place], DO [facility] Pager [phone]  If 7PM-7AM, please contact night-coverage [email or web address] Password [facility] 08/22/2015, 12:49 PM

## 2015-08-22 NOTE — Procedures (Signed)
Electrical Cardioversion Procedure Note Frank Hoover [id number] 03-29-1950  Procedure: Electrical Cardioversion Indications:  Atrial Flutter. He is on heparin gtt.   Procedure Details Consent: Risks of procedure as well as the alternatives and risks of each were explained to the (patient/caregiver).  Consent for procedure obtained. Time Out: Verified patient identification, verified procedure, site/side was marked, verified correct patient position, special equipment/implants available, medications/allergies/relevent history reviewed, required imaging and test results available.  Performed  Patient placed on cardiac monitor, pulse oximetry, supplemental oxygen as necessary.  Sedation given: Per anesthesiology. Pacer pads placed anterior and posterior chest.  Cardioverted 1 time(s).  Cardioverted at 150J.  Evaluation Findings: Post procedure EKG shows: NSR Complications: None Patient did tolerate procedure well.   Frank Hoover 08/22/2015, 9:36 AM

## 2015-08-22 NOTE — Anesthesia Preprocedure Evaluation (Addendum)
Anesthesia Evaluation  Patient identified by MRN, date of birth, ID band Patient awake    Reviewed: Allergy & Precautions, NPO status , Patient's Chart, lab work & pertinent test results, reviewed documented beta blocker date and time   Airway Mallampati: IV  TM Distance: >3 FB Neck ROM: Full    Dental  (+) Dental Advisory Given, Edentulous Upper, Edentulous Lower   Pulmonary sleep apnea , COPD, former smoker,    breath sounds clear to auscultation + decreased breath sounds      Cardiovascular hypertension, Pt. on medications and Pt. on home beta blockers +CHF  + dysrhythmias Atrial Fibrillation  Rhythm:Irregular Rate:Tachycardia  EF 25% by Echo   Neuro/Psych negative neurological ROS     GI/Hepatic Neg liver ROS, GERD  Medicated,gastroparesis   Endo/Other  diabetes, Type 2  Renal/GU ARFRenal disease     Musculoskeletal negative musculoskeletal ROS (+)   Abdominal   Peds  Hematology  (+) Blood dyscrasia (heparin gtt), anemia ,   Anesthesia Other Findings Day of surgery medications reviewed with the patient.  Reproductive/Obstetrics                          Anesthesia Physical Anesthesia Plan  ASA: III  Anesthesia Plan: MAC   Post-op Pain Management:    Induction: Intravenous  Airway Management Planned: Nasal Cannula  Additional Equipment:   Intra-op Plan:   Post-operative Plan:   Informed Consent: I have reviewed the patients History and Physical, chart, labs and discussed the procedure including the risks, benefits and alternatives for the proposed anesthesia with the patient or authorized representative who has indicated his/her understanding and acceptance.   Dental advisory given  Plan Discussed with: CRNA and Anesthesiologist  Anesthesia Plan Comments: (Discussed risks/benefits/alternatives to MAC sedation including need for ventilatory support, hypotension, need for  conversion to general anesthesia.  All patient questions answered.  Patient/guardian wishes to proceed.)        Anesthesia Quick Evaluation

## 2015-08-22 NOTE — Anesthesia Postprocedure Evaluation (Signed)
Anesthesia Post Note  Patient: Frank Hoover  Procedure(s) Performed: Procedure(s) (LRB): TRANSESOPHAGEAL ECHOCARDIOGRAM (TEE) (N/A) CARDIOVERSION (N/A)  Patient location during evaluation: Endoscopy Anesthesia Type: MAC Level of consciousness: awake and alert Pain management: pain level controlled Vital Signs Assessment: post-procedure vital signs reviewed and stable Respiratory status: spontaneous breathing, nonlabored ventilation, respiratory function stable and patient connected to nasal cannula oxygen Cardiovascular status: stable and blood pressure returned to baseline Anesthetic complications: no    Last Vitals:  Filed Vitals:   08/22/15 1400 08/22/15 1632  BP:  131/73  Pulse: 106 80  Temp:  37.4 C  Resp: 25 17    Last Pain:  Filed Vitals:   08/22/15 1633  PainSc: Esperanza Edward Turk

## 2015-08-22 NOTE — Interval H&P Note (Signed)
History and Physical Interval Note:  08/22/2015 9:12 AM  Frank Hoover  has presented today for surgery, with the diagnosis of AFIB  The various methods of treatment have been discussed with the patient and family. After consideration of risks, benefits and other options for treatment, the patient has consented to  Procedure(s): TRANSESOPHAGEAL ECHOCARDIOGRAM (TEE) (N/A) CARDIOVERSION (N/A) as a surgical intervention .  The patient's history has been reviewed, patient examined, no change in status, stable for surgery.  I have reviewed the patient's chart and labs.  Questions were answered to the patient's satisfaction.     Leonid Manus [employer]

## 2015-08-22 NOTE — Evaluation (Signed)
Physical Therapy Evaluation Patient Details Name: Frank Hoover MRN: [id number] DOB: 05-07-1950 Today's Date: 08/22/2015   History of Present Illness  65 year old male with PMH of GERD, OSA, COPD, admitted with dizziness, tachycardia and leg edema. Dx with acute cardiogenic/septic shock, acute diastolic CHF, A-flutter, intubated 6/13-6/18. Pt underwent TEE with cardioversion 6/20.  Clinical Impression  Pt admitted with/for acute cardiogenic/septic shock.  Pt significantly deconditioned  Pt currently limited functionally due to the problems listed. ( See problems list.)   Pt will benefit from PT to maximize function and safety in order to get ready for next venue listed below.     Follow Up Recommendations CIR    Equipment Recommendations   (TBA)    Recommendations for Other Services       Precautions / Restrictions Precautions Precautions: Fall      Mobility  Bed Mobility Overal bed mobility: Needs Assistance Bed Mobility: Supine to Sit     Supine to sit: Max assist;+2 for physical assistance     General bed mobility comments: pt assisted to EOB with extensive truncal assist and helicopter pivot using the pad.  Transfers                 General transfer comment: defered transfer or OOB today due to level of arousal  Ambulation/Gait                Stairs            Wheelchair Mobility    Modified Rankin (Stroke Patients Only)       Balance Overall balance assessment: Needs assistance Sitting-balance support: Single extremity supported;No upper extremity supported Sitting balance-Leahy Scale: Fair Sitting balance - Comments: no assistance needed and did a few LAQ's                                     Pertinent Vitals/Pain Pain Assessment: Faces Faces Pain Scale: Hurts little more Pain Location: vague lower body/trunk Pain Descriptors / Indicators: Grimacing;Guarding Pain Intervention(s): Monitored during session     Home Living Family/patient expects to be discharged to:: Private residence Living Arrangements: Spouse/significant other Available Help at Discharge: Family;Available 24 hours/day Type of Home: Apartment Home Access: Level entry     Home Layout: One level Home Equipment: Walker - 2 wheels;Shower seat Additional Comments: per wife (and secondarily by a lethargic pt) pt independent at home, drove, but was on disability.    Prior Function Level of Independence: Independent               Hand Dominance        Extremity/Trunk Assessment   Upper Extremity Assessment: Defer to OT evaluation           Lower Extremity Assessment: Generalized weakness;RLE deficits/detail;LLE deficits/detail RLE Deficits / Details: minimal resistance to movement or sponatneous movemet LLE Deficits / Details: see right side     Communication   Communication: No difficulties  Cognition Arousal/Alertness: Lethargic Behavior During Therapy: WFL for tasks assessed/performed Overall Cognitive Status: Difficult to assess                      General Comments      Exercises Low Level/ICU Exercises Heel Slides: AAROM;Both;15 reps      Assessment/Plan    PT Assessment Patient needs continued PT services  PT Diagnosis Generalized weakness   PT Problem List Decreased strength;Decreased range  of motion;Decreased activity tolerance;Decreased balance;Decreased mobility;Decreased coordination  PT Treatment Interventions DME instruction;Gait training;Functional mobility training;Therapeutic activities;Therapeutic exercise;Neuromuscular re-education;Patient/family education   PT Goals (Current goals can be found in the Care Plan section) Acute Rehab PT Goals Patient Stated Goal: back home PT Goal Formulation: With patient Time For Goal Achievement: 09/05/15 Potential to Achieve Goals: Fair    Frequency Min 3X/week   Barriers to discharge        Co-evaluation                End of Session   Activity Tolerance: Patient limited by fatigue Patient left: in bed;with call bell/phone within reach Nurse Communication: Mobility status         Time: [id number] PT Time Calculation (min) (ACUTE ONLY): 29 min   Charges:   PT Evaluation $PT Eval Moderate Complexity: 1 Procedure PT Treatments $Therapeutic Activity: 8-22 mins   PT G Codes:        Zareya Tuckett, Jaz Heifner 08/22/2015, 4:57 PM 08/22/2015  Donnella Sham, PT [phone]  (pager)

## 2015-08-22 NOTE — Progress Notes (Signed)
With minimal bleeding in the urethra, cleansed and kept dry.

## 2015-08-22 NOTE — Progress Notes (Signed)
Echocardiogram Echocardiogram Transesophageal has been performed.  Frank Hoover 08/22/2015, 9:47 AM

## 2015-08-23 ENCOUNTER — Other Ambulatory Visit: Payer: Self-pay

## 2015-08-23 ENCOUNTER — Encounter (HOSPITAL_COMMUNITY): Payer: Self-pay | Admitting: Cardiology

## 2015-08-23 DIAGNOSIS — I471 Supraventricular tachycardia: Secondary | ICD-10-CM

## 2015-08-23 LAB — BASIC METABOLIC PANEL
Anion gap: 9 (ref 5–15)
BUN: 73 mg/dL — ABNORMAL HIGH (ref 6–20)
CO2: 27 mmol/L (ref 22–32)
Calcium: 8.1 mg/dL — ABNORMAL LOW (ref 8.9–10.3)
Chloride: 102 mmol/L (ref 101–111)
Creatinine, Ser: 3.02 mg/dL — ABNORMAL HIGH (ref 0.61–1.24)
GFR calc Af Amer: 24 mL/min — ABNORMAL LOW (ref >60)
GFR calc non Af Amer: 21 mL/min — ABNORMAL LOW (ref >60)
Glucose, Bld: 248 mg/dL — ABNORMAL HIGH (ref 65–99)
Potassium: 3.6 mmol/L (ref 3.5–5.1)
Sodium: 138 mmol/L (ref 135–145)

## 2015-08-23 LAB — GLUCOSE, CAPILLARY
Glucose-Capillary: 227 mg/dL — ABNORMAL HIGH (ref 65–99)
Glucose-Capillary: 210 mg/dL — ABNORMAL HIGH (ref 65–99)
Glucose-Capillary: 185 mg/dL — ABNORMAL HIGH (ref 65–99)
Glucose-Capillary: 110 mg/dL — ABNORMAL HIGH (ref 65–99)

## 2015-08-23 LAB — CBC
HCT: 27.8 % — ABNORMAL LOW (ref 39.0–52.0)
Hemoglobin: 8.8 g/dL — ABNORMAL LOW (ref 13.0–17.0)
MCH: 27.8 pg (ref 26.0–34.0)
MCHC: 31.7 g/dL (ref 30.0–36.0)
MCV: 87.7 fL (ref 78.0–100.0)
Platelets: 195 10*3/uL (ref 150–400)
RBC: 3.17 MIL/uL — ABNORMAL LOW (ref 4.22–5.81)
RDW: 16 % — ABNORMAL HIGH (ref 11.5–15.5)
WBC: 10.2 10*3/uL (ref 4.0–10.5)

## 2015-08-23 LAB — OCCULT BLOOD X 1 CARD TO LAB, STOOL: Fecal Occult Bld: POSITIVE — AB

## 2015-08-23 LAB — C DIFFICILE QUICK SCREEN W PCR REFLEX
C Diff antigen: NEGATIVE
C Diff interpretation: NEGATIVE
C Diff toxin: NEGATIVE

## 2015-08-23 LAB — PROTIME-INR
INR: 1.36 (ref 0.00–1.49)
Prothrombin Time: 16.9 seconds — ABNORMAL HIGH (ref 11.6–15.2)

## 2015-08-23 LAB — HEPARIN LEVEL (UNFRACTIONATED): Heparin Unfractionated: 0.4 IU/mL (ref 0.30–0.70)

## 2015-08-23 MED ORDER — ADENOSINE 6 MG/2ML IV SOLN
INTRAVENOUS | Status: AC
  Filled 2015-08-23: qty 2

## 2015-08-23 MED ORDER — WARFARIN VIDEO
Freq: Once | Status: AC
  Administered 2015-08-23: 12:00:00

## 2015-08-23 MED ORDER — GI COCKTAIL ~~LOC~~
30.0000 mL | Freq: Once | ORAL | Status: AC
  Administered 2015-08-23: 30 mL via ORAL
  Filled 2015-08-23: qty 30

## 2015-08-23 MED ORDER — POTASSIUM CHLORIDE CRYS ER 20 MEQ PO TBCR
40.0000 meq | EXTENDED_RELEASE_TABLET | Freq: Once | ORAL | Status: AC
  Administered 2015-08-23: 40 meq via ORAL
  Filled 2015-08-23: qty 2

## 2015-08-23 MED ORDER — CLONIDINE HCL 0.1 MG PO TABS
0.1000 mg | ORAL_TABLET | Freq: Two times a day (BID) | ORAL | Status: DC
  Administered 2015-08-23 (×2): 0.1 mg via ORAL
  Filled 2015-08-23 (×2): qty 1

## 2015-08-23 MED ORDER — COUMADIN BOOK
Freq: Once | Status: AC
  Administered 2015-08-23: 10:00:00
  Filled 2015-08-23: qty 1

## 2015-08-23 MED ORDER — WARFARIN - PHARMACIST DOSING INPATIENT
Freq: Every day | Status: DC
  Administered 2015-08-24 – 2015-09-01 (×6)

## 2015-08-23 MED ORDER — ADENOSINE 6 MG/2ML IV SOLN
6.0000 mg | Freq: Once | INTRAVENOUS | Status: AC
Start: 2015-08-23 — End: 2015-08-23
  Administered 2015-08-23: 6 mg via INTRAVENOUS

## 2015-08-23 MED ORDER — WARFARIN SODIUM 10 MG PO TABS
10.0000 mg | ORAL_TABLET | Freq: Once | ORAL | Status: AC
  Administered 2015-08-23: 10 mg via ORAL
  Filled 2015-08-23: qty 1

## 2015-08-23 MED ORDER — METOPROLOL TARTRATE 50 MG PO TABS
50.0000 mg | ORAL_TABLET | Freq: Two times a day (BID) | ORAL | Status: DC
  Administered 2015-08-23 – 2015-08-25 (×5): 50 mg via ORAL
  Filled 2015-08-23 (×5): qty 1

## 2015-08-23 MED ORDER — INSULIN GLARGINE 100 UNIT/ML ~~LOC~~ SOLN
32.0000 [IU] | Freq: Every day | SUBCUTANEOUS | Status: DC
  Administered 2015-08-23 – 2015-08-30 (×7): 32 [IU] via SUBCUTANEOUS
  Filled 2015-08-23 (×8): qty 0.32

## 2015-08-23 NOTE — Progress Notes (Deleted)
PT Cancellation Note  Patient Details Name: Frank Hoover MRN: [id number] DOB: November 28, 1950   Cancelled Treatment:    Reason Eval/Treat Not Completed: Medical issues which prohibited therapy.  Pt's HR up as high as 134 resting comfortably in bed.  Will hold PT until pt more medically appropriate for exertional activity.  Collie Siad PT, DPT  Pager: [phone] Phone: [phone] 08/23/2015, 1:48 PM

## 2015-08-23 NOTE — Progress Notes (Signed)
ANTICOAGULATION CONSULT NOTE - Follow Up Consult  Pharmacy Consult for Heparin Indication: atrial fibrillation  No Known Allergies  Patient Measurements: Height: 5\' 6"  (167.6 cm) Weight: 267 lb 4.8 oz (121.246 kg) IBW/kg (Calculated) : 63.8 Heparin Dosing Weight: 94 kg  Vital Signs: Temp: 98 F (36.7 C) (06/21 0817) Temp Source: Oral (06/21 0817) BP: 132/61 mmHg (06/21 0413) Pulse Rate: 177 (06/21 0700)  Labs:  Recent Labs  08/20/15 1800 08/21/15 0430  08/21/15 2122 08/22/15 0420 08/22/15 1030 08/23/15 0250  HGB 9.8* 9.8*  --   --   --   --  8.8*  HCT 30.1* 31.4*  --   --   --   --  27.8*  PLT 115* 148*  --   --   --   --  195  HEPARINUNFRC 0.20* 0.22*  < > 0.30 0.53  --  0.40  CREATININE  --  3.25*  --   --   --  3.13* 3.02*  < > = values in this interval not displayed.  Estimated Creatinine Clearance: 30.7 mL/min (by C-G formula based on Cr of 3.02).  . sodium chloride    . sodium chloride Stopped (08/21/15 1617)  . heparin 1,700 Units/hr (08/23/15 0400)    Assessment: 6 YOM admitted 08/14/2015 with newly diagnosed AFlutter with RVR. Pharmacy consulted to dose heparin. Heparin levels remain in goal range on heparin at 1700 units/hr. CBC stable. No issues with line or bleeding reported per RN.  Goal of Therapy:  Heparin level 0.3-0.7 units/ml Monitor platelets by anticoagulation protocol: Yes   Plan:  Continue IV heparin at current rate. Daily heparin levels and CBC for now. Starting Coumadin today.  Uvaldo Rising, BCPS  Clinical Pharmacist Pager [phone]  08/23/2015 8:51 AM

## 2015-08-23 NOTE — Progress Notes (Signed)
Physical medicine rehabilitation consult requested. Patient presently medically not stable cardiology services request on rehabilitation consult at this time and follow-up once patient able to participate

## 2015-08-23 NOTE — Progress Notes (Signed)
Pt in SVT 180's.  MD paged

## 2015-08-23 NOTE — Progress Notes (Addendum)
Patient: Frank Hoover / Admit Date: 08/14/2015 / Date of Encounter: 08/23/2015, 6:57 AM   Subjective: When asked how he is feeling he says he's feeling pretty good but upon further questioning does have some chest discomfort while in SVT. Carotid massage performed by Dr. Stanford Breed -> NSR -> patient now feels "fair." No further CP. Tolerated well without complication.   Objective: Telemetry: SVT 170s Physical Exam: Blood pressure 132/61, pulse 77, temperature 98.2 F (36.8 C), temperature source Oral, resp. rate 22, height 5' 6"  (1.676 m), weight 267 lb 4.8 oz (121.246 kg), SpO2 95 %. General: Well developed obese AAM in no acute distress. Head: Normocephalic, atraumatic, sclera non-icteric, no xanthomas, nares are without discharge. Divergence of eyes noted. Neck: Negative for carotid bruits. JVP not elevated. Lungs: Clear bilaterally to auscultation without wheezes, rales, or rhonchi. Breathing is unlabored. Heart: Tachy, regular S1 S2 without murmurs, rubs, or gallops.  Abdomen: Soft, non-tender, non-distended with normoactive bowel sounds. No rebound/guarding. Extremities: No clubbing or cyanosis. No edema. Distal pedal pulses are 2+ and equal bilaterally. Neuro: Alert and oriented X 3. Moves all extremities spontaneously. Psych:  Responds to questions appropriately with a flat affect.   Intake/Output Summary (Last 24 hours) at 08/23/15 0657 Last data filed at 08/23/15 0600  Gross per 24 hour  Intake   1254 ml  Output   2470 ml  Net  -1216 ml    Inpatient Medications:  . amiodarone  400 mg Oral BID  . cloNIDine  0.2 mg Oral TID  . feeding supplement (GLUCERNA SHAKE)  237 mL Oral BID BM  . furosemide  40 mg Oral BID  . insulin aspart  0-15 Units Subcutaneous TID WC  . insulin glargine  25 Units Subcutaneous Daily  . pantoprazole  40 mg Oral Daily  . sodium chloride flush  3 mL Intravenous Q12H   Infusions:  . sodium chloride    . sodium chloride Stopped (08/21/15 1617)    . heparin 1,700 Units/hr (08/23/15 0400)    Labs:  Recent Labs  08/20/15 1335  08/21/15 0430 08/22/15 1030 08/23/15 0250  NA  --   < > 144 138 138  K  --   < > 3.2* 4.7 3.6  CL  --   < > 105 104 102  CO2  --   < > 27 28 27   GLUCOSE  --   < > 281* 263* 248*  BUN  --   < > 77* 74* 73*  CREATININE  --   < > 3.25* 3.13* 3.02*  CALCIUM  --   < > 8.6* 8.2* 8.1*  MG 2.5*  --  2.3  --   --   PHOS  --   --  3.1  --   --   < > = values in this interval not displayed. No results for input(s): AST, ALT, ALKPHOS, BILITOT, PROT, ALBUMIN in the last 72 hours.  Recent Labs  08/21/15 0430 08/23/15 0250  WBC 10.2 10.2  HGB 9.8* 8.8*  HCT 31.4* 27.8*  MCV 86.5 87.7  PLT 148* 195   No results for input(s): CKTOTAL, CKMB, TROPONINI in the last 72 hours. Invalid input(s): POCBNP No results for input(s): HGBA1C in the last 72 hours.   Radiology/Studies:  Ct Abdomen Pelvis Wo Contrast  08/16/2015  CLINICAL DATA:  65 year old male with history of abdominal distention. EXAM: CT ABDOMEN AND PELVIS WITHOUT CONTRAST TECHNIQUE: Multidetector CT imaging of the abdomen and pelvis was performed following the standard protocol  without IV contrast. COMPARISON:  No priors. FINDINGS: Lower chest: Cardiomegaly. Atherosclerotic calcifications in the right coronary artery. Trace bilateral pleural effusions. Hepatobiliary: No definite cystic or solid hepatic lesions are identified within the liver on today's noncontrast CT examination. Unenhanced appearance of the gallbladder is normal. Pancreas: Atrophy in the pancreas. No definite pancreatic mass. Stranding throughout the retroperitoneum is noted, including adjacent to the pancreas. No well-defined peripancreatic fluid collections are identified. Spleen: Unremarkable. Adrenals/Urinary Tract: Bilateral perinephric stranding (nonspecific). Unenhanced appearance of the kidneys and bilateral adrenal glands is otherwise unremarkable. No hydroureteronephrosis to  indicate urinary tract obstruction at this time. Foley balloon catheter inside the urinary bladder which is nearly completely decompressed. Stomach/Bowel: Unenhanced appearance of the stomach is normal. Tip of nasogastric tube in the body of the stomach. There is extensive colonic wall thickening in the sigmoid colon and rectal wall thickening, suggestive of a distal proctocolitis. Haziness in the mesorectum and distal sigmoid mesocolon this presumably secondary to inflammation. No pathologic dilatation of small bowel or colon. Normal appendix. Vascular/Lymphatic: Atherosclerotic calcifications throughout the abdominal and pelvic vasculature, without definite aneurysm. No lymphadenopathy noted in the abdomen or pelvis. Reproductive: Prostate gland and seminal vesicles are unremarkable in appearance. Other: Small umbilical hernia containing only omental fat. Trace volume of ascites. No pneumoperitoneum. Mild diffuse body wall edema. Musculoskeletal: There are no aggressive appearing lytic or blastic lesions noted in the visualized portions of the skeleton. IMPRESSION: 1. Thickening of the sigmoid colon and rectal wall with surrounding inflammatory changes in the adjacent mesocolon/mesorectum, concerning for distal proctocolitis. 2. Trace bilateral pleural effusions, trace volume of ascites, retroperitoneal edema and diffuse mild body wall edema suggestive of a state of mild anasarca. 3. Atherosclerosis, including right coronary artery disease. Please note that although the presence of coronary artery calcium documents the presence of coronary artery disease, the severity of this disease and any potential stenosis cannot be assessed on this non-gated CT examination. Assessment for potential risk factor modification, dietary therapy or pharmacologic therapy may be warranted, if clinically indicated. 4. Cardiomegaly. Electronically Signed   By: Vinnie Langton M.D.   On: 08/16/2015 13:45   Dg Chest 2  View  08/14/2015  CLINICAL DATA:  65 year old male with shortness of breath. EXAM: CHEST  2 VIEW COMPARISON:  Chest radiograph dated 12/02/2013 FINDINGS: The heart size and mediastinal contours are within normal limits. Both lungs are clear. The visualized skeletal structures are unremarkable. IMPRESSION: No active cardiopulmonary disease. Electronically Signed   By: Anner Crete M.D.   On: 08/14/2015 15:52   US Renal  08/15/2015  CLINICAL DATA:  Acute kidney injury. EXAM: RENAL / URINARY TRACT ULTRASOUND COMPLETE COMPARISON:  Ultrasound of June 18, 2004. FINDINGS: Right Kidney: Length: 8.5 cm. Increased echogenicity of renal parenchyma is noted. No mass or hydronephrosis visualized. Left Kidney: Length: 10 cm. Echogenicity within normal limits. No mass or hydronephrosis visualized. Bladder: Appears normal for degree of bladder distention. IMPRESSION: Mild right renal atrophy is noted with increased echogenicity of parenchyma of right kidney suggesting medical renal disease. No hydronephrosis or renal obstruction is noted. Left kidney appears normal. Electronically Signed   By: Marijo Conception, M.D.   On: 08/15/2015 12:46   Dg Chest Port 1 View  08/20/2015  CLINICAL DATA:  Patient with history of ETT. EXAM: PORTABLE CHEST 1 VIEW COMPARISON:  Chest radiograph 08/19/2015. FINDINGS: Right IJ central venous catheter tip projects over the superior vena cava. ET tube terminates in the distal trachea. Enteric tube tip and side-port project  over the stomach. Low lung volumes. Stable enlarged cardiac and mediastinal contours. Interval increase left mid lower lung heterogeneous opacities with small layering left pleural effusion. Unchanged bandlike opacity right mid lung. IMPRESSION: Stable support apparatus. Interval increase heterogeneous opacities left mid lower lung most compatible with atelectasis and small left pleural effusion. Probable right mid lung subsegmental atelectasis. Electronically Signed   By:  Lovey Newcomer M.D.   On: 08/20/2015 08:22   Dg Chest Port 1 View  08/19/2015  CLINICAL DATA:  Respiratory failure. EXAM: PORTABLE CHEST 1 VIEW COMPARISON:  Radiograph of August 18, 2015. FINDINGS: Stable cardiomediastinal silhouette. Endotracheal and nasogastric tubes are unchanged in position. Right internal jugular catheter is also unchanged with tip in expected position of SVC. No pneumothorax or pleural effusion is noted. Left lung is clear. Mild right midlung subsegmental atelectasis is noted. Bony thorax is unremarkable. IMPRESSION: Stable support apparatus. Mild right midlung subsegmental atelectasis is noted. Electronically Signed   By: Marijo Conception, M.D.   On: 08/19/2015 07:35   Dg Chest Port 1 View  08/18/2015  CLINICAL DATA:  Respiratory failure EXAM: PORTABLE CHEST 1 VIEW COMPARISON:  08/17/2015 FINDINGS: Cardiac shadow is stable. An endotracheal tube and nasogastric catheter are noted in stable position. A right jugular central line is again seen. Bibasilar atelectatic changes are noted worse on the right than the left. These have increased in the interval from the prior exam. IMPRESSION: Increasing bibasilar atelectatic changes. Tubes and lines as described. Electronically Signed   By: Inez Catalina M.D.   On: 08/18/2015 06:43   Dg Chest Port 1 View  08/17/2015  CLINICAL DATA:  Respiratory failure. EXAM: PORTABLE CHEST 1 VIEW COMPARISON:  08/15/2015. FINDINGS: Endotracheal tube 3 cm above the carina on today's exam. NG tube, right IJ line stable position. Heart size normal. Right mid lung and bibasilar subsegmental atelectasis. No pleural effusion or pneumothorax. IMPRESSION: 1. Endotracheal tube 3 cm above the carina on today's exam. NG tube and right IJ line stable position. 2. Right mid lung and bibasilar subsegmental atelectasis. Electronically Signed   By: Marcello Moores  Register   On: 08/17/2015 06:56   Dg Chest Port 1 View  08/15/2015  CLINICAL DATA:  Endotracheal tube placement and central  line placement. Nasogastric tube placement. Initial encounter. EXAM: PORTABLE CHEST 1 VIEW COMPARISON:  Chest radiograph performed 08/14/2015 FINDINGS: The patient's endotracheal tube is seen ending 1-2 cm above the carina. This could be retracted 1-2 cm, as deemed clinically appropriate. An right IJ line is noted ending about the mid SVC. An enteric tube is noted ending overlying the body of the stomach. The lungs are hypoexpanded. Right perihilar airspace opacity could reflect pneumonia. No pleural effusion or pneumothorax is seen. The cardiomediastinal silhouette is borderline normal in size. No acute osseous abnormalities are identified. External pacing pads are noted. IMPRESSION: 1. Endotracheal tube seen ending 1-2 cm above the carina. This could be retracted 1-2 cm, as deemed clinically appropriate. 2. Right IJ line noted ending about the mid SVC. 3. Enteric tube seen extending overlying the body of the stomach. 4. Lungs hypoexpanded. Right perihilar airspace opacity could reflect pneumonia. Electronically Signed   By: Garald Balding M.D.   On: 08/15/2015 18:32   Dg Chest Port 1 View  08/14/2015  CLINICAL DATA:  Shortness of breath. Altered mental status. Bilateral leg edema. EXAM: PORTABLE CHEST 1 VIEW COMPARISON:  08/14/2015 FINDINGS: Examination is technically limited due to motion artifact. Shallow inspiration. Mild cardiac enlargement without vascular congestion. No focal  airspace disease or consolidation in the lungs. No blunting of costophrenic angles. No pneumothorax. IMPRESSION: Shallow inspiration.  No evidence of active pulmonary disease. Electronically Signed   By: Lucienne Capers M.D.   On: 08/14/2015 23:42   Dg Abd Portable 1v  08/16/2015  CLINICAL DATA:  Malfunction of nasogastric tube. EXAM: PORTABLE ABDOMEN - 1 VIEW COMPARISON:  08/15/2015. FINDINGS: NG tube coiled in the stomach. Catheter noted in the pelvis. Prominently distended loops of what appear to be colon noted. Colonic ileus  and/or colonic obstruction could present this fashion. Eighty sigmoid volvulus cannot be completely excluded. No free air. Follow-up exam suggested to demonstrate resolution. IMPRESSION: 1. NG tube noted coiled in the stomach. 2. Prominent dilated loops of bowel which appear to be colon noted. Colonic obstruction cannot be excluded. Sigmoid volvulus cannot be excluded. Follow-up abdominal series suggested to demonstrate resolution. Electronically Signed   By: Marcello Moores  Register   On: 08/16/2015 06:59     Assessment and Plan  43M with history of CKD stage III, COPD, HTN, gout, DM admitted after presenting for an echocardiogram ordered by his nephrologist secondary to elevated heart rate and leg edema. He then was referred to the ER after call placed to Dr. Lowanda Foster reporting patient heart rate in the 120s and associated dizziness. He was found to be in newly recognized atrial flutter RVR of unknown duration, acute cardiogenic/septic shock requiring pressors & IV abx (citrobacter bacteremia), acute respiratory failure requiring ventilation, EF 25%. Ischemic workup not yet pursued due to AKI on CKD with Cr peak over 5. Elevated flat troponin 6/12-6/14. Atrial flutter RVR persisted despite amiodarone loading thus underwent TEE/DCCV 08/22/15 with successful conversion to NSR. This AM (08/23/15) was noted to go into rapid SVT HR 170-180s with associated chest discomfort and fall in BP.  1. Acute cardiogenic/septic shock and acute respiratory failure - off pressors, off vent, need to watch BP.   2. Acute systolic CHF - will review plan for diuretic with MD. Not on ACEI/ARB/spiro due to ARF.  3. Atrial flutter RVR - s/p TEE/DCCV 08/22/15. On heparin until renal function improves.  4. New SVT 08/23/15 - resolved with carotid massage. Per d/w MD, will d/c clonidine and substitute metoprolol.  5. AKI on CKD stage III - Cr continues to gradually improve. Avoid contrast studies for now.   6. Progressive anemia - ? Due  to critical illness/CKD. Further per IM.  Signed, Melina Copa PA-C Pager: [phone]   Patient seen and examined and history reviewed. Agree with above findings and plan. Patient developed SVT this am approximately 6:30 am. Different than atrial flutter he had before. Associated with chest tightness. States he does not feel well. SVT rate 190 bpm. Converted with carotid sinus massage. Now NSR rate 86. BP low with SVT but now 138/69. Lungs clear. No gallop or murmur. 1+ ankle edema. Abdomen soft and NT.  Now on lasix po. Renal function stable but appears to have reached a plateau. Stage 4 CKD. For this reason he is not a good candidate for DOAC. Will go ahead and initiate oral Coumadin for anticoagulation. Continue IV heparin. Will resume metoprolol po for SVT (not clear why this was stopped before. Hold clonidine for now. If needs additional BP control would favor hydralazine and nitrates with CHF.  Continue amiodarone po. Heme check stools.   Fajr Fife [place], [place] 08/23/2015 7:27 AM   Addendum: discussed clonidine therapy with pharmacy. There was concern earlier that he may have clonidine rebound with marked HTN when  clonidine stopped. For this reason we will reduce clonidine to 0.1 mg bid. If additional BP control needed will start oral hydralazine and nitrates for CHF and titrate. Would like to wean off clonidine if possible.   Aileana Hodder [place] MD, [facility]  8:45 AM

## 2015-08-23 NOTE — Progress Notes (Signed)
Cardiac Monitoring Event  Dysrhythmia:  Supraventricular tachycardia 170's  Symptoms:  Asymptomatic  Level of Consciousness:  Arousable or Lethargic  Last set of vital signs taken:  Temp: 97.5 F (36.4 C)  Pulse Rate: 80  Resp: (!) 25  BP: (!) 146/60 mmHg  SpO2: 99 %  Name of MD Notified:  Schorr  Time MD Notified:  Ilan.Hilts  Comments/Actions Taken:  Consulted cardiology. EKG ordered. PA arrived on unit at 0645. Prepared to administer adenosine. Placed pads. MD arrived at [phone] and performed carotid massage. Pt rhythm converted to NSR 80's. Became more alert.

## 2015-08-23 NOTE — Consult Note (Signed)
Physical Medicine and Rehabilitation Consult Reason for Consult: Debilitation/acute cardiogenic septic shock/respiratory failure Referring Physician: [facility]   HPI: Frank Hoover is a 65 y.o. right handed male with history of hypertension, COPD, former smoker, diabetes mellitus, chronic renal insufficiency with creatinine 3.35 followed by Dr Lowanda Foster. Patient lives with spouse. Independent prior to admission. One level home. Initially presented to [facility] 08/14/2015 on referral from his nephrologist for progressive leg swelling over the last few months and increasing shortness of breath with exertion as well as dizziness. Echocardiogram showed evidence of cardiomyopathy with ejection fraction 25%. Troponin 0.13, potassium 2.9. Chest x-ray no active disease. Cardiology consulted placed on Cardizem as well as intravenous heparin and Lasix. He was transferred to [facility] 08/15/2015 for further evaluation. Placed on CPAP. Developed chills and temperature 104 axillary and placed on broad-spectrum antibiotics and sepsis protocol. Urine showed many bacteria moderate leukocytes negative nitrites. Preliminary blood cultures demonstrated gram-negative rods. Patient declined respiratory required intubation. Attempts at passing Foley tube failed urology consulted Dr. Tresa Moore with insertion of Foley tube. Renal ultrasound completed showing no signs of hydronephrosis or obstruction. Renal status remained stable flatus creatinine 3.0. TEE completed per cardiology services showing mildly dilated RV with moderately decreased systolic function. No thrombus noted. There was a large PFO noted by color Doppler and bubble study. Underwent electrical cardioversion of atrial flutter 08/22/2015. Patient currently remains on intravenous heparin. Physical therapy evaluation completed 08/22/2015 with recommendations of physical medicine rehabilitation consult due to deconditioning.   Review of Systems    Constitutional: Positive for fever. Negative for chills.  HENT: Negative for hearing loss.   Eyes: Negative for blurred vision and double vision.  Respiratory: Positive for shortness of breath.   Cardiovascular: Positive for palpitations and leg swelling. Negative for chest pain.  Gastrointestinal: Positive for constipation. Negative for nausea and vomiting.       GERD  Genitourinary: Negative for dysuria.  Musculoskeletal: Positive for myalgias and joint pain.  Skin: Negative for rash.  Neurological: Positive for dizziness and weakness. Negative for seizures and headaches.  All other systems reviewed and are negative.  Past Medical History  Diagnosis Date  . Gout   . COPD (chronic obstructive pulmonary disease) ([place])   . Type 2 diabetes mellitus ([place])   . GERD (gastroesophageal reflux disease)   . Essential hypertension   . Hyperlipemia   . OA (osteoarthritis)   . Gastroparesis   . Adenomatous colon polyp 10/30/09    Serrated adenoma removed during Colonoscopy   . Diverticulosis of colon   . CKD (chronic kidney disease) stage 3, GFR 30-59 ml/min    Past Surgical History  Procedure Laterality Date  . Tonsillectomy    . Cataract extraction Right   . Esophagogastroduodenoscopy  10/30/09    KZS:WFUXNA   . Colonoscopy  10/30/09    TFT:DDUK-GURKY diverticulum/serrated adenoma from ICV, next colonoscopy due 10/2012  . Colonoscopy N/A 09/18/2012    HCW:CBJSEGB mucosa that was seen appeared normal, however most of it was not seen due to be very poor prep  . Colonoscopy N/A 04/08/2013    TDV:VOHYWVP polyp removed/inadequate preparation  . Colonoscopy N/A 06/22/2014    Procedure: COLONOSCOPY;  Surgeon: Daneil Dolin, MD;  Location: [facility];  Service: Endoscopy;  Laterality: N/A;  115    Family History  Problem Relation Age of Onset  . Diabetes Mother   . COPD Father   . Hypertension Mother   . Colon cancer Neg Hx  Social History:  reports that he quit smoking about 4 years  ago. His smoking use included Cigarettes. He has a 4 pack-year smoking history. He does not have any smokeless tobacco history on file. He reports that he does not drink alcohol or use illicit drugs. Allergies: No Known Allergies Medications Prior to Admission  Medication Sig Dispense Refill  . acetaminophen (TYLENOL) 500 MG tablet Take 1 tablet (500 mg total) by mouth every 6 (six) hours as needed for moderate pain. 30 tablet 0  . albuterol (PROAIR HFA) 108 (90 BASE) MCG/ACT inhaler Inhale 2 puffs into the lungs every 6 (six) hours as needed for wheezing or shortness of breath.     . allopurinol (ZYLOPRIM) 300 MG tablet Take 300 mg by mouth daily.      Marland Kitchen amLODipine (NORVASC) 10 MG tablet Take 10 mg by mouth daily.    Marland Kitchen atorvastatin (LIPITOR) 20 MG tablet Take 20 mg by mouth daily.    . Cholecalciferol (VITAMIN D PO) Take 1,000 Units by mouth daily.    . cloNIDine (CATAPRES) 0.2 MG tablet Take 0.2 mg by mouth 3 (three) times daily.     . colchicine 0.6 MG tablet Take 0.6 mg by mouth daily.    . furosemide (LASIX) 40 MG tablet Take 1 tablet (40 mg total) by mouth daily. (Patient taking differently: Take 40-80 mg by mouth 2 (two) times daily. 2 tablets in the morning and 1 tablet in the evening.) 30 tablet 12  . gabapentin (NEURONTIN) 400 MG capsule Take 400 mg by mouth 2 (two) times daily. Takes at noon & bedtime    . glimepiride (AMARYL) 2 MG tablet Take 2 mg by mouth daily before breakfast.      . HUMALOG KWIKPEN 100 UNIT/ML KiwkPen Inject 1-54 Units into the skin 3 (three) times daily before meals. Per sliding scale    . hydrALAZINE (APRESOLINE) 50 MG tablet Take 1 tablet (50 mg total) by mouth 2 (two) times daily. 60 tablet 12  . LINZESS 290 MCG CAPS capsule TAKE 1 CAPSULE ONCE DAILY 30 MINUTES BEFORE MEALS 90 capsule 3  . metoprolol (LOPRESSOR) 100 MG tablet Take 100 mg by mouth 2 (two) times daily.     Marland Kitchen omeprazole (PRILOSEC) 20 MG capsule Take 20 mg by mouth daily.    Nelva Nay SOLOSTAR 300  UNIT/ML SOPN Inject 15 Units into the skin at bedtime.    Marland Kitchen zolpidem (AMBIEN) 10 MG tablet Take 10 mg by mouth at bedtime.     Marland Kitchen HYDROcodone-acetaminophen (NORCO/VICODIN) 5-325 MG per tablet Take 1 tablet by mouth every 6 (six) hours as needed for moderate pain.     Marland Kitchen lanolin ointment Apply 1 application topically 2 (two) times daily as needed for dry skin.      Home: Home Living Family/patient expects to be discharged to:: Private residence Living Arrangements: Spouse/significant other Available Help at Discharge: Family, Available 24 hours/day Type of Home: Apartment Home Access: Level entry [place]: One level Bathroom Shower/Tub: Chiropodist: [place]: Environmental consultant - 2 wheels, Shower seat Additional Comments: per wife (and secondarily by a lethargic pt) pt independent at home, drove, but was on disability.  Functional History: Prior Function Level of Independence: Independent Functional Status:  Mobility: Bed Mobility Overal bed mobility: Needs Assistance Bed Mobility: Supine to Sit Supine to sit: Max assist, +2 for physical assistance General bed mobility comments: pt assisted to EOB with extensive truncal assist and helicopter pivot using the pad. Transfers General  transfer comment: defered transfer or OOB today due to level of arousal      ADL:    Cognition: Cognition Overall Cognitive Status: Difficult to assess Orientation Level: Oriented to person, Oriented to place, Disoriented to time, Oriented to situation (Time - unsure of year; aware of DOW) Cognition Arousal/Alertness: Lethargic Behavior During Therapy: WFL for tasks assessed/performed Overall Cognitive Status: Difficult to assess Difficult to assess due to: Level of arousal  Blood pressure 132/61, pulse 77, temperature 98.2 F (36.8 C), temperature source Oral, resp. rate 22, height 5' 6"  (1.676 m), weight 121.246 kg (267 lb 4.8 oz), SpO2 95 %. Physical Exam    Constitutional: He is oriented to person, place, and time. He appears well-developed.  Morbidly obese  HENT:  Head: Normocephalic.  Eyes: EOM are normal.  Neck: Normal range of motion. Neck supple. No thyromegaly present.  Cardiovascular: Normal rate and normal heart sounds.   Cardiac rate controlled  Respiratory: Effort normal and breath sounds normal. No respiratory distress.  GI: Soft. Bowel sounds are normal. He exhibits no distension.  Genitourinary:  Rectal tube with blood tinged stool  Neurological: He is alert and oriented to person, place, and time.  Reasonable insight and awareness. CN intact. UE 3+/5 prox to 4/5 distal. Some limitations in right shoulder especially. LE: 3-/5 HF, KE and 3+ ADF/PF. Senses pain and gross touch in all 4  Skin: Skin is warm and dry.    Results for orders placed or performed during the hospital encounter of 08/14/15 (from the past 24 hour(s))  Basic metabolic panel     Status: Abnormal   Collection Time: 08/22/15 10:30 AM  Result Value Ref Range   Sodium 138 135 - 145 mmol/L   Potassium 4.7 3.5 - 5.1 mmol/L   Chloride 104 101 - 111 mmol/L   CO2 28 22 - 32 mmol/L   Glucose, Bld 263 (H) 65 - 99 mg/dL   BUN 74 (H) 6 - 20 mg/dL   Creatinine, Ser 3.13 (H) 0.61 - 1.24 mg/dL   Calcium 8.2 (L) 8.9 - 10.3 mg/dL   GFR calc non Af Amer 20 (L) >60 mL/min   GFR calc Af Amer 23 (L) >60 mL/min   Anion gap 6 5 - 15  Glucose, capillary     Status: Abnormal   Collection Time: 08/22/15 12:16 PM  Result Value Ref Range   Glucose-Capillary 251 (H) 65 - 99 mg/dL   Comment 1 Capillary Specimen   Glucose, capillary     Status: Abnormal   Collection Time: 08/22/15  4:31 PM  Result Value Ref Range   Glucose-Capillary 234 (H) 65 - 99 mg/dL   Comment 1 Capillary Specimen   Glucose, capillary     Status: Abnormal   Collection Time: 08/22/15 10:50 PM  Result Value Ref Range   Glucose-Capillary 292 (H) 65 - 99 mg/dL   Comment 1 Capillary Specimen   Heparin  level (unfractionated)     Status: None   Collection Time: 08/23/15  2:50 AM  Result Value Ref Range   Heparin Unfractionated 0.40 0.30 - 0.70 IU/mL  Basic metabolic panel     Status: Abnormal   Collection Time: 08/23/15  2:50 AM  Result Value Ref Range   Sodium 138 135 - 145 mmol/L   Potassium 3.6 3.5 - 5.1 mmol/L   Chloride 102 101 - 111 mmol/L   CO2 27 22 - 32 mmol/L   Glucose, Bld 248 (H) 65 - 99 mg/dL   BUN 73 (  H) 6 - 20 mg/dL   Creatinine, Ser 3.02 (H) 0.61 - 1.24 mg/dL   Calcium 8.1 (L) 8.9 - 10.3 mg/dL   GFR calc non Af Amer 21 (L) >60 mL/min   GFR calc Af Amer 24 (L) >60 mL/min   Anion gap 9 5 - 15  CBC     Status: Abnormal   Collection Time: 08/23/15  2:50 AM  Result Value Ref Range   WBC 10.2 4.0 - 10.5 K/uL   RBC 3.17 (L) 4.22 - 5.81 MIL/uL   Hemoglobin 8.8 (L) 13.0 - 17.0 g/dL   HCT 27.8 (L) 39.0 - 52.0 %   MCV 87.7 78.0 - 100.0 fL   MCH 27.8 26.0 - 34.0 pg   MCHC 31.7 30.0 - 36.0 g/dL   RDW 16.0 (H) 11.5 - 15.5 %   Platelets 195 150 - 400 K/uL   No results found.  Assessment/Plan: Diagnosis: Morbid obese male with debility related to respiratory failure/sepsis and multiple medical 1. Does the need for close, 24 hr/day medical supervision in concert with the patient's rehab needs make it unreasonable for this patient to be served in a less intensive setting? Yes 2. Co-Morbidities requiring supervision/potential complications: DM, HTN, afib, dm 3. Due to bladder management, bowel management, safety, skin/wound care, disease management, medication administration, pain management and patient education, does the patient require 24 hr/day rehab nursing? Yes 4. Does the patient require coordinated care of a physician, rehab nurse, PT (1-2 hrs/day, 5 days/week) and OT (1-2 hrs/day, 5 days/week) to address physical and functional deficits in the context of the above medical diagnosis(es)? Yes Addressing deficits in the following areas: balance, endurance, locomotion,  strength, transferring, bowel/bladder control, bathing, dressing, feeding, grooming, toileting, cognition and psychosocial support 5. Can the patient actively participate in an intensive therapy program of at least 3 hrs of therapy per day at least 5 days per week? Yes 6. The potential for patient to make measurable gains while on inpatient rehab is excellent 7. Anticipated functional outcomes upon discharge from inpatient rehab are min assist  with PT, min assist and mod assist with OT, n/a with SLP. 8. Estimated rehab length of stay to reach the above functional goals is: potentially 12-18 days 9. Does the patient have adequate social supports and living environment to accommodate these discharge functional goals? Yes 10. Anticipated D/C setting: Home 11. Anticipated post D/C treatments: HH therapy and Outpatient therapy 12. Overall Rehab/Functional Prognosis: good  RECOMMENDATIONS: This patient's condition is appropriate for continued rehabilitative care in the following setting: CIR Patient has agreed to participate in recommended program. Yes Note that insurance prior authorization may be required for reimbursement for recommended care.  Comment: Spoke to pt,wife, CNA who all would like to pursue inpatient rehab. Rehab Admissions Coordinator to follow up.  Thanks,  Meredith Staggers, MD, Mellody Drown     08/23/2015

## 2015-08-23 NOTE — Progress Notes (Signed)
6mg  IV Adenosine given per Dr. [place].  Pt converted to NSR.

## 2015-08-23 NOTE — Progress Notes (Signed)
PROGRESS NOTE    Frank Hoover  CBJ:[id number] DOB: [birth year] DOA: 08/14/2015 PCP: Alonza Bogus, MD   Outpatient Specialists:     Brief Narrative:  65 yo male presented to [facility] with dizziness, tachycardia and leg edema on 6/12. Found to have atrial flutter w/ EF 25%.  Found to have both cardiogenic and septic shock. She required intubation by PCCM, successfully extubated on 6/19 Much improved and was transferred to [facility] on 6/20.     Assessment & Plan:   Principal Problem:   Atrial flutter with rapid ventricular response (HCC) Active Problems:   GERD   DM (diabetes mellitus) ([place])   HTN (hypertension)   Hyperlipemia   Sleep apnea   Systolic CHF ([place])   Acute respiratory failure (HCC)   Septic shock (HCC)   Acute encephalopathy   Acute kidney injury ([place])   Acute systolic heart failure (HCC)   Acute hypoxic resp failure - With history of OSA, COPD, and cardiogenic/septic shock - Bipap Qhs for sleep apnea - improving, currently on 2 L nasal cannula  Septic shock with citrobacter bacteremia - Treated with total of 8 days of IV antibiotics.  Cardiogenic shock/acute  systolic CHF with EF 16% - Currently off pressors, management per cardiology - Diuresis per cardiology, appears to be improving, not on ACEI/ARB/spiral due to ARF.  Atrial fib - Management per cardiology, status post s/p  TEE/DCCV 08/22/15, in NSR - On IV heparin for anticoagulation, started on warfarin today,not NOAC candidate given his renal failure  Episode of SVT on 08/23/2015 - Did resolve with carotid massage, started on beta blocker  HTN - Blood pressure acceptable, clonidine wean down, started on metoprolol  AKI on CKD  - Continue to monitor, improving despite diuresis  Anemia of CD - Dr. closely and transfuse as needed  DM  - Uncontrolled, continue with insulin sliding scale, will increase Lantus from 25-32 SSI  Urinary retention -foley per urology  Acute metabolic  encephalopathy - Improving    DVT prophylaxis:  Heparin gtt  Code Status: Full Code   Family Communication: Wife at bedside  Disposition Plan:  Remain in SDU, will need SNF placement when stable, I would anticipate in 2-3 days.   Consultants:   Cards  PCCM  urology  Procedures:  6/13 R Femoral CVL >> 6/13 6/13 ETT >> 6/18 6/13 Lt radial aline >> 6/17 6/13 RIJ CVL >>>plan out 6/19    Subjective: Feeling better, had an episode of SVT this a.m., resolved after carotid massage by cardiology.  Objective: Filed Vitals:   08/23/15 0600 08/23/15 0700 08/23/15 0800 08/23/15 0817  BP:   141/63   Pulse: 77 177 86   Temp:    98 F (36.7 C)  TempSrc:    Oral  Resp: 22 22    Height:      Weight:      SpO2: 95% 94% 99%     Intake/Output Summary (Last 24 hours) at 08/23/15 1024 Last data filed at 08/23/15 0700  Gross per 24 hour  Intake    870 ml  Output   2470 ml  Net  -1600 ml   Filed Weights   08/21/15 0407 08/22/15 0429 08/23/15 0413  Weight: 121.9 kg (268 lb 11.9 oz) 121.609 kg (268 lb 1.6 oz) 121.246 kg (267 lb 4.8 oz)    Examination:  General exam: Awake, alert,  Respiratory system: diminshed, no wheezing, Respiratory effort normal. Cardiovascular system: S1 & S2 heard, RRR. No JVD, murmurs, rubs, gallops or  clicks. No pedal edema. Gastrointestinal system: Abdomen is nondistended, soft and nontender, obese. Lower extremity: + edema, pulses felt bilaterally     Data Reviewed: I have personally reviewed following labs and imaging studies  CBC:  Recent Labs Lab 08/19/15 0452 08/20/15 0430 08/20/15 1800 08/21/15 0430 08/23/15 0250  WBC 5.8 6.6 8.0 10.2 10.2  HGB 9.3* 9.4* 9.8* 9.8* 8.8*  HCT 28.4* 29.4* 30.1* 31.4* 27.8*  MCV 85.0 85.0 85.3 86.5 87.7  PLT 66* 88* 115* 148* 188   Basic Metabolic Panel:  Recent Labs Lab 08/18/15 1145 08/19/15 0438 08/20/15 0430 08/20/15 1335 08/21/15 0430 08/22/15 1030 08/23/15 0250  NA 134* 136  139  --  144 138 138  K 3.5 3.3* 3.4*  --  3.2* 4.7 3.6  CL 97* 98* 101  --  105 104 102  CO2 25 24 25   --  27 28 27   GLUCOSE 132* 150* 256*  --  281* 263* 248*  BUN 79* 79* 77*  --  77* 74* 73*  CREATININE 5.34* 4.66* 3.85*  --  3.25* 3.13* 3.02*  CALCIUM 6.8* 7.4* 8.3*  --  8.6* 8.2* 8.1*  MG  --   --  2.5* 2.5* 2.3  --   --   PHOS 5.7*  --  4.2  --  3.1  --   --    GFR: Estimated Creatinine Clearance: 30.7 mL/min (by C-G formula based on Cr of 3.02). Liver Function Tests:  Recent Labs Lab 08/17/15 0438 08/18/15 1145  AST 109*  --   ALT 89*  --   ALKPHOS 80  --   BILITOT 1.1  --   PROT 4.5*  --   ALBUMIN 1.8* 1.5*   No results for input(s): LIPASE, AMYLASE in the last 168 hours. No results for input(s): AMMONIA in the last 168 hours. Coagulation Profile:  Recent Labs Lab 08/23/15 0853  INR 1.36   Cardiac Enzymes: No results for input(s): CKTOTAL, CKMB, CKMBINDEX, TROPONINI in the last 168 hours. BNP (last 3 results) No results for input(s): PROBNP in the last 8760 hours. HbA1C: No results for input(s): HGBA1C in the last 72 hours. CBG:  Recent Labs Lab 08/21/15 2057 08/22/15 1216 08/22/15 1631 08/22/15 2250 08/23/15 0816  GLUCAP 320* 251* 234* 292* 227*   Lipid Profile: No results for input(s): CHOL, HDL, LDLCALC, TRIG, CHOLHDL, LDLDIRECT in the last 72 hours. Thyroid Function Tests: No results for input(s): TSH, T4TOTAL, FREET4, T3FREE, THYROIDAB in the last 72 hours. Anemia Panel: No results for input(s): VITAMINB12, FOLATE, FERRITIN, TIBC, IRON, RETICCTPCT in the last 72 hours. Urine analysis:    Component Value Date/Time   COLORURINE AMBER* 08/14/2015 2125   APPEARANCEUR HAZY* 08/14/2015 2125   LABSPEC 1.015 08/14/2015 2125   PHURINE 5.0 08/14/2015 2125   GLUCOSEU 100* 08/14/2015 2125   HGBUR LARGE* 08/14/2015 2125   BILIRUBINUR NEGATIVE 08/14/2015 2125   KETONESUR NEGATIVE 08/14/2015 2125   PROTEINUR 100* 08/14/2015 2125   UROBILINOGEN 0.2  12/02/2013 1739   NITRITE NEGATIVE 08/14/2015 2125   LEUKOCYTESUR MODERATE* 08/14/2015 2125   Sepsis Labs: @LABRCNTIP (procalcitonin:4,lacticidven:4)  ) Recent Results (from the past 240 hour(s))  MRSA PCR Screening     Status: None   Collection Time: 08/14/15  9:25 PM  Result Value Ref Range Status   MRSA by PCR NEGATIVE NEGATIVE Final    Comment:        The GeneXpert MRSA Assay (FDA approved for NASAL specimens only), is one component of a comprehensive MRSA colonization  surveillance program. It is not intended to diagnose MRSA infection nor to guide or monitor treatment for MRSA infections.   Culture, Urine     Status: Abnormal   Collection Time: 08/14/15  9:25 PM  Result Value Ref Range Status   Specimen Description URINE, CLEAN CATCH  Final   Special Requests NONE  Final   Culture >=100,000 COLONIES/mL CITROBACTER KOSERI (A)  Final   Report Status 08/17/2015 FINAL  Final   Organism ID, Bacteria CITROBACTER KOSERI (A)  Final      Susceptibility   Citrobacter koseri - MIC*    CEFAZOLIN <=4 SENSITIVE Sensitive     CEFTRIAXONE <=1 SENSITIVE Sensitive     CIPROFLOXACIN <=0.25 SENSITIVE Sensitive     GENTAMICIN <=1 SENSITIVE Sensitive     IMIPENEM <=0.25 SENSITIVE Sensitive     NITROFURANTOIN <=16 SENSITIVE Sensitive     TRIMETH/SULFA <=20 SENSITIVE Sensitive     * >=100,000 COLONIES/mL CITROBACTER KOSERI  Culture, blood (routine x 2)     Status: Abnormal   Collection Time: 08/14/15 11:30 PM  Result Value Ref Range Status   Specimen Description BLOOD RIGHT HAND  Final   Special Requests BOTTLES DRAWN AEROBIC AND ANAEROBIC 4CC EACH  Final   Culture  Setup Time   Final    GRAM NEGATIVE RODS RECOVERED FROM THE AEROBIC BOTTLE Gram Stain Report Called to,Read Back By and Verified With: [place]. AT 1406 ON 08/15/2015 BY BAUGHAM,M. Performed at [facility] Organism ID to follow CRITICAL RESULT CALLED TO, READ BACK BY AND VERIFIED WITH: A MEYER PHARMD 1935  08/15/15 A BROWNING GRAM NEGATIVE RODS RECOVERED FROM THE ANAEROBIC BOTTLE  AT [facility]  GRAM STAIN RESULTS PREVIOUSLY CALLED BY [facility]    Culture CITROBACTER KOSERI (A)  Final   Report Status 08/19/2015 FINAL  Final   Organism ID, Bacteria CITROBACTER KOSERI  Final      Susceptibility   Citrobacter koseri - MIC*    CEFAZOLIN <=4 SENSITIVE Sensitive     CEFEPIME <=1 SENSITIVE Sensitive     CEFTAZIDIME <=1 SENSITIVE Sensitive     CEFTRIAXONE <=1 SENSITIVE Sensitive     CIPROFLOXACIN <=0.25 SENSITIVE Sensitive     GENTAMICIN <=1 SENSITIVE Sensitive     IMIPENEM <=0.25 SENSITIVE Sensitive     TRIMETH/SULFA <=20 SENSITIVE Sensitive     PIP/TAZO <=4 SENSITIVE Sensitive     * CITROBACTER KOSERI  Blood Culture ID Panel (Reflexed)     Status: Abnormal   Collection Time: 08/14/15 11:30 PM  Result Value Ref Range Status   Enterococcus species NOT DETECTED NOT DETECTED Final   Vancomycin resistance NOT DETECTED NOT DETECTED Final   Listeria monocytogenes NOT DETECTED NOT DETECTED Final   Staphylococcus species NOT DETECTED NOT DETECTED Final   Staphylococcus aureus NOT DETECTED NOT DETECTED Final   Methicillin resistance NOT DETECTED NOT DETECTED Final   Streptococcus species NOT DETECTED NOT DETECTED Final   Streptococcus agalactiae NOT DETECTED NOT DETECTED Final   Streptococcus pneumoniae NOT DETECTED NOT DETECTED Final   Streptococcus pyogenes NOT DETECTED NOT DETECTED Final   Acinetobacter baumannii NOT DETECTED NOT DETECTED Final   Enterobacteriaceae species DETECTED (A) NOT DETECTED Final    Comment: CRITICAL RESULT CALLED TO, READ BACK BY AND VERIFIED WITH: A MEYER PHARMD 1935 08/15/15 A BROWNING    Enterobacter cloacae complex NOT DETECTED NOT DETECTED Final   Escherichia coli NOT DETECTED NOT DETECTED Final   Klebsiella oxytoca NOT DETECTED NOT DETECTED Final   Klebsiella pneumoniae NOT DETECTED NOT DETECTED  Final   Proteus species NOT DETECTED NOT DETECTED Final   Serratia marcescens  NOT DETECTED NOT DETECTED Final   Carbapenem resistance NOT DETECTED NOT DETECTED Final   Haemophilus influenzae NOT DETECTED NOT DETECTED Final   Neisseria meningitidis NOT DETECTED NOT DETECTED Final   Pseudomonas aeruginosa NOT DETECTED NOT DETECTED Final   Candida albicans NOT DETECTED NOT DETECTED Final   Candida glabrata NOT DETECTED NOT DETECTED Final   Candida krusei NOT DETECTED NOT DETECTED Final   Candida parapsilosis NOT DETECTED NOT DETECTED Final   Candida tropicalis NOT DETECTED NOT DETECTED Final    Comment: Performed at [facility]  Culture, blood (routine x 2)     Status: None   Collection Time: 08/14/15 11:45 PM  Result Value Ref Range Status   Specimen Description BLOOD LEFT HAND  Final   Special Requests BOTTLES DRAWN AEROBIC AND ANAEROBIC 3CC EACH  Final   Culture NO GROWTH 5 DAYS  Final   Report Status 08/19/2015 FINAL  Final      Anti-infectives    Start     Dose/Rate Route Frequency Ordered Stop   08/20/15 0000  cefTRIAXone (ROCEPHIN) 2 g in dextrose 5 % 50 mL IVPB  Status:  Discontinued     2 g 100 mL/hr over 30 Minutes Intravenous Every 24 hours 08/19/15 1112 08/22/15 1310   08/18/15 1100  cefTAZidime (FORTAZ) 2 g in dextrose 5 % 50 mL IVPB  Status:  Discontinued     2 g 100 mL/hr over 30 Minutes Intravenous Every 24 hours 08/18/15 1039 08/19/15 1112   08/17/15 1800  vancomycin (VANCOCIN) 1,500 mg in sodium chloride 0.9 % 500 mL IVPB  Status:  Discontinued     1,500 mg 250 mL/hr over 120 Minutes Intravenous Every 48 hours 08/15/15 1636 08/15/15 1956   08/16/15 2100  ceFEPIme (MAXIPIME) 1 g in dextrose 5 % 50 mL IVPB  Status:  Discontinued     1 g 100 mL/hr over 30 Minutes Intravenous Every 24 hours 08/15/15 1957 08/16/15 1415   08/16/15 2100  ceFEPIme (MAXIPIME) 2 g in dextrose 5 % 50 mL IVPB  Status:  Discontinued     2 g 100 mL/hr over 30 Minutes Intravenous Every 24 hours 08/16/15 1415 08/16/15 1741   08/16/15 1900  piperacillin-tazobactam  (ZOSYN) IVPB 2.25 g  Status:  Discontinued     2.25 g 100 mL/hr over 30 Minutes Intravenous Every 8 hours 08/16/15 1809 08/18/15 1014   08/15/15 2100  ceFEPIme (MAXIPIME) 2 g in dextrose 5 % 50 mL IVPB     2 g 100 mL/hr over 30 Minutes Intravenous  Once 08/15/15 1957 08/15/15 2359   08/15/15 1730  vancomycin (VANCOCIN) 2,000 mg in sodium chloride 0.9 % 500 mL IVPB     2,000 mg 250 mL/hr over 120 Minutes Intravenous  Once 08/15/15 1636 08/15/15 2000   08/15/15 0900  piperacillin-tazobactam (ZOSYN) IVPB 3.375 g  Status:  Discontinued     3.375 g 12.5 mL/hr over 240 Minutes Intravenous Every 8 hours 08/15/15 0856 08/15/15 1956   08/15/15 0100  vancomycin (VANCOCIN) IVPB 1000 mg/200 mL premix     1,000 mg 200 mL/hr over 60 Minutes Intravenous STAT 08/15/15 0048 08/15/15 0206   08/14/15 2330  vancomycin (VANCOCIN) IVPB 1000 mg/200 mL premix     1,000 mg 200 mL/hr over 60 Minutes Intravenous STAT 08/14/15 2316 08/15/15 0041   08/14/15 2330  piperacillin-tazobactam (ZOSYN) IVPB 3.375 g     3.375 g 12.5  mL/hr over 240 Minutes Intravenous STAT 08/14/15 2317 08/15/15 0341       Radiology Studies: No results found.      Scheduled Meds: . amiodarone  400 mg Oral BID  . cloNIDine  0.1 mg Oral BID  . feeding supplement (GLUCERNA SHAKE)  237 mL Oral BID BM  . furosemide  40 mg Oral BID  . insulin aspart  0-15 Units Subcutaneous TID WC  . insulin glargine  32 Units Subcutaneous Daily  . metoprolol tartrate  50 mg Oral BID  . pantoprazole  40 mg Oral Daily  . potassium chloride  40 mEq Oral Once  . sodium chloride flush  3 mL Intravenous Q12H  . warfarin  10 mg Oral ONCE-1800  . [COMPLETED] warfarin   Does not apply Once  . Warfarin - Pharmacist Dosing Inpatient   Does not apply q1800   Continuous Infusions: . sodium chloride    . sodium chloride Stopped (08/21/15 1617)  . heparin 1,700 Units/hr (08/23/15 0400)     LOS: 9 days    Time spent: 25 min    Cornelius Marullo,  MD [facility] Pager [phone]  If 7PM-7AM, please contact night-coverage [email or web address] Password [facility] 08/23/2015, 10:24 AM

## 2015-08-23 NOTE — Consult Note (Signed)
ELECTROPHYSIOLOGY CONSULT NOTE    Patient ID: Frank Hoover MRN: [id number], DOB/AGE: 08-Jun-1950 65 y.o.  Admit date: 08/14/2015 Date of Consult: 08/23/2015  Primary Physician: Alonza Bogus, MD Primary Cardiologist: Dr. Domenic Polite (new), Dr. Martinique Requesting MD: Dr. Martinique  Reason for Consultation: PSVT, AFlutter RVR  HPI: CORNELL Hoover is a 65 y.o. male was originally seen by nephrologist who apparently noted the pt to have fast HR and edema, was sent for an echo, he was referred then from the echo lab to the ER at [facility] 08/15/15, there noted to have AFlutter with RVR (new for him), the echo showed EF 25% as a new finding for him, he was hypokalemic with K+ 2.9, noted with ARF on CRRI with Creat 2.4, his BS was 54 and was admitted, the next day developed shaking chills with temp 104 with diminished mentation, and treatment for sepsis started, CCM had suspicion of aspiration since he had apparently had a vomiting episode while wearing  BIPAP, and ultimately was intubated, 08/15/15 with worsening respiratory distress.  Transferred to [facility] for further care and management 08/15/15, intubated and on pressor support.  He continued to have PAFlutter with RVR, he was started on heparin gtt (though stopped briefly with low plts, this ultimately felt secondary to his sepsis and resumed) and amio boluses and PRN BB were used for rate control, 08/19/15 he was able to wean off pressors.  08/20/15 extubated, and labs/condition noted to be improving  PMHx includes COPD, HTN, CRI stage III, HLD  He is feeling much better, has the hiccups this afternoon, no CP, palpitations or SOB.  Past Medical History  Diagnosis Date  . Gout   . COPD (chronic obstructive pulmonary disease) ([place])   . Type 2 diabetes mellitus (Frank Hoover)   . GERD (gastroesophageal reflux disease)   . Essential hypertension   . Hyperlipemia   . OA (osteoarthritis)   . Gastroparesis   . Adenomatous colon polyp 10/30/09    Serrated  adenoma removed during Colonoscopy   . Diverticulosis of colon   . CKD (chronic kidney disease) stage 3, GFR 30-59 ml/min      Surgical History:  Past Surgical History  Procedure Laterality Date  . Tonsillectomy    . Cataract extraction Right   . Esophagogastroduodenoscopy  10/30/09    MCN:OBSJGG   . Colonoscopy  10/30/09    EZM:[id number]-[id number] diverticulum/serrated adenoma from ICV, next colonoscopy due 10/2012  . Colonoscopy N/A 09/18/2012    TKP:TWSFKCL mucosa that was seen appeared normal, however most of it was not seen due to be very poor prep  . Colonoscopy N/A 04/08/2013    EXN:TZGYFVC polyp removed/inadequate preparation  . Colonoscopy N/A 06/22/2014    Procedure: COLONOSCOPY;  Surgeon: Daneil Dolin, MD;  Location: [facility];  Service: Endoscopy;  Laterality: N/A;  115      Prescriptions prior to admission  Medication Sig Dispense Refill Last Dose  . acetaminophen (TYLENOL) 500 MG tablet Take 1 tablet (500 mg total) by mouth every 6 (six) hours as needed for moderate pain. 30 tablet 0 unknown  . albuterol (PROAIR HFA) 108 (90 BASE) MCG/ACT inhaler Inhale 2 puffs into the lungs every 6 (six) hours as needed for wheezing or shortness of breath.    unknown  . allopurinol (ZYLOPRIM) 300 MG tablet Take 300 mg by mouth daily.     unknown at Unknown time  . amLODipine (NORVASC) 10 MG tablet Take 10 mg by mouth daily.   unknown at  Unknown time  . atorvastatin (LIPITOR) 20 MG tablet Take 20 mg by mouth daily.   unknown at Unknown time  . Cholecalciferol (VITAMIN D PO) Take 1,000 Units by mouth daily.   unknown at Unknown time  . cloNIDine (CATAPRES) 0.2 MG tablet Take 0.2 mg by mouth 3 (three) times daily.    unknown at Unknown time  . colchicine 0.6 MG tablet Take 0.6 mg by mouth daily.   unknown at Unknown time  . furosemide (LASIX) 40 MG tablet Take 1 tablet (40 mg total) by mouth daily. (Patient taking differently: Take 40-80 mg by mouth 2 (two) times daily. 2 tablets in the morning  and 1 tablet in the evening.) 30 tablet 12 unknown at Unknown time  . gabapentin (NEURONTIN) 400 MG capsule Take 400 mg by mouth 2 (two) times daily. Takes at noon & bedtime   unknown at Unknown time  . glimepiride (AMARYL) 2 MG tablet Take 2 mg by mouth daily before breakfast.     unknown at Unknown time  . HUMALOG KWIKPEN 100 UNIT/ML KiwkPen Inject 1-54 Units into the skin 3 (three) times daily before meals. Per sliding scale   unknown  . hydrALAZINE (APRESOLINE) 50 MG tablet Take 1 tablet (50 mg total) by mouth 2 (two) times daily. 60 tablet 12 unknown at Unknown time  . LINZESS 290 MCG CAPS capsule TAKE 1 CAPSULE ONCE DAILY 30 MINUTES BEFORE MEALS 90 capsule 3 unknown at Unknown time  . metoprolol (LOPRESSOR) 100 MG tablet Take 100 mg by mouth 2 (two) times daily.    unknown at Unknown time  . omeprazole (PRILOSEC) 20 MG capsule Take 20 mg by mouth daily.   unknown at Unknown time  . TOUJEO SOLOSTAR 300 UNIT/ML SOPN Inject 15 Units into the skin at bedtime.   unknown at Unknown time  . zolpidem (AMBIEN) 10 MG tablet Take 10 mg by mouth at bedtime.    unknown at Unknown time  . HYDROcodone-acetaminophen (NORCO/VICODIN) 5-325 MG per tablet Take 1 tablet by mouth every 6 (six) hours as needed for moderate pain.    unknown  . lanolin ointment Apply 1 application topically 2 (two) times daily as needed for dry skin.   unknown    Inpatient Medications:  . amiodarone  400 mg Oral BID  . cloNIDine  0.1 mg Oral BID  . feeding supplement (GLUCERNA SHAKE)  237 mL Oral BID BM  . furosemide  40 mg Oral BID  . insulin aspart  0-15 Units Subcutaneous TID WC  . insulin glargine  32 Units Subcutaneous Daily  . metoprolol tartrate  50 mg Oral BID  . pantoprazole  40 mg Oral Daily  . sodium chloride flush  3 mL Intravenous Q12H  . warfarin  10 mg Oral ONCE-1800  . Warfarin - Pharmacist Dosing Inpatient   Does not apply q1800    Allergies: No Known Allergies  Social History   Social History  .  Marital Status: Married    Spouse Name: N/A  . Number of Children: 2  . Years of Education: N/A   Occupational History  . retired    Social History Main Topics  . Smoking status: Former Smoker -- 1.00 packs/day for 4 years    Types: Cigarettes    Quit date: 08/26/2010  . Smokeless tobacco: Not on file  . Alcohol Use: No  . Drug Use: No  . Sexual Activity: Not on file   Other Topics Concern  . Not on file  Social History Narrative     Family History  Problem Relation Age of Onset  . Diabetes Mother   . COPD Father   . Hypertension Mother   . Colon cancer Neg Hx      Review of Systems: All other systems reviewed and are otherwise negative except as noted above.  Physical Exam: Filed Vitals:   08/23/15 0900 08/23/15 1000 08/23/15 1100 08/23/15 1125  BP: 150/68 151/72 144/83   Pulse: 84 94 94   Temp:    97.4 F (36.3 C)  TempSrc:    Oral  Resp: 19 18 24    Height:      Weight:      SpO2: 99% 97% 100%     GEN- The patient is obese, somewhat ill appearing, alert and oriented x 3 today.   HEENT: normocephalic, atraumatic; sclera clear, conjunctiva pink; hearing intact; oropharynx clear; neck supple, no JVP Lymph- no cervical lymphadenopathy Lungs- Clear to ausculation bilaterally, normal work of breathing.  No wheezes, rales, rhonchi Heart- Regular rate and rhythm, no murmurs, rubs or gallops, PMI not laterally displaced GI- soft, non-tender, non-distended, bowel sounds present, GI rectal bag in place. Extremities- no clubbing, cyanosis, or edema MS- no significant deformity or atrophy Skin- warm and dry, no rash or lesion Psych- euthymic mood, full affect Neuro- no gross deficits observed  Labs:   Lab Results  Component Value Date   WBC 10.2 08/23/2015   HGB 8.8* 08/23/2015   HCT 27.8* 08/23/2015   MCV 87.7 08/23/2015   PLT 195 08/23/2015    Recent Labs Lab 08/17/15 0438  08/23/15 0250  NA 131*  < > 138  K 4.6  < > 3.6  CL 93*  < > 102  CO2 19*   < > 27  BUN 72*  < > 73*  CREATININE 5.32*  < > 3.02*  CALCIUM 6.5*  < > 8.1*  PROT 4.5*  --   --   BILITOT 1.1  --   --   ALKPHOS 80  --   --   ALT 89*  --   --   AST 109*  --   --   GLUCOSE 309*  < > 248*  < > = values in this interval not displayed.    Radiology/Studies: Dg Chest Port 1 View 08/20/2015  CLINICAL DATA:  Patient with history of ETT. EXAM: PORTABLE CHEST 1 VIEW COMPARISON:  Chest radiograph 08/19/2015. FINDINGS: Right IJ central venous catheter tip projects over the superior vena cava. ET tube terminates in the distal trachea. Enteric tube tip and side-port project over the stomach. Low lung volumes. Stable enlarged cardiac and mediastinal contours. Interval increase left mid lower lung heterogeneous opacities with small layering left pleural effusion. Unchanged bandlike opacity right mid lung. IMPRESSION: Stable support apparatus. Interval increase heterogeneous opacities left mid lower lung most compatible with atelectasis and small left pleural effusion. Probable right mid lung subsegmental atelectasis. Electronically Signed   By: Lovey Newcomer M.D.   On: 08/20/2015 08:22      EKG: 6/12/17AFlutter 124bpm 08/23/15 SVT 175bpm  October SR, 1st degree AVBlock  TELEMETRY: currently SR 80's, PAFlutter with variable rates through his stay>>> SR yesterday s/p DCCV, 2 episode PSVT rates 180's today  08/14/15: Echocardiogram Study Conclusions - Left ventricle: The cavity size was normal. Wall thickness was  increased in a pattern of mild LVH. The estimated ejection  fraction was 25%. Study telemetry shows a regular tachycardia  suggestive of atrial flutter. Diffuse hypokinesis. Probable  akinesis of the inferoseptal myocardium. The study is not  technically sufficient to allow evaluation of LV diastolic  function. - Aortic valve: Mildly calcified annulus. Trileaflet. - Mitral valve: There was trivial regurgitation. - Left atrium: The atrium was mildly dilated. -  Right ventricle: Systolic function was moderately reduced. - Right atrium: The atrium was at the upper limits of normal in  size. Central venous pressure (est): 8 mm Hg. - Atrial septum: There was a patent foramen ovale. Bidirectional  shunting noted by color Doppler. - Tricuspid valve: There was mild regurgitation. - Pulmonary arteries: PA peak pressure: 31 mm Hg (S). - Pericardium, extracardiac: There was no pericardial effusion. Impressions: - Mild LVH with LVEF approximately 25%, diffuse hypokinesis with  probable akinesis of the inferior septum. Left ventricular  dysfunction is new in comparison to the previous study from 2015.  Study telemetry looks to show atrial flutter with RVR.  Indeterminate diastolic function. Mild left atrial enlargement.  Trivial mitral regurgitation. Moderately reduced RV contraction.  Mild tricuspid regurgitation with PASP 31 mmHg. 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  Assessment and Plan:  1. PSVT     Responded to carotid massage, recurrent episode broke with adenosine 55m 2. PAFlutter, new for the patient in setting of septic shock     TEE/DCCV 08/22/15     CHA2DS2Vasc is at least 3, currently on heparin gtt/warfarin     H/H 8.8/27.8 (trending down)     plts 195 (trending up)     INR 1.36  He has been on amio gtt started 08/18/15, PO started last PM, intermitenly has gotten lopressor doses, routinly started today 564mBID, for now, follow his response to the BB, BP has room to tolerate, could consider ablation in the future, if recurrent and overall condition continues to improve and allow.  Patient states he has had "racing heart beats" in the past, [place]  3. Septic/cardiogenic shock, much improved     citrobacter     UTI     Colitis  4. Respiratory failure     Extubated 08/20/15  4. Cardiomyopathy (new)     Will defer ischemic evaluation/timing to  primary cards team     Mild Trop elevation (max 0.28) at the start of his hospital stay felt demand secondary to overall condition and rapid HR     Fluid negative x4 days, though cumulatively only negative - [id number]. ARI/CRI      Peak Creat 5.37, today is 3.02  6. PFO with bidirectional shunting on his echo     PA pressure 26m7m Signed, ReneTommye Standard-C 08/23/2015 12:30 PM  EP Attending  Patient seen and examined. Agree with above with minimal modification. Exam reveals a middle aged obese man, NAD with a rectal bag, Clear lungs and a RRR. Trace edema. The patient has had incessant atrial flutter but has also had recurrent SVT, likely AVNRT, which has been stopped by both vagal maneuvers as well as IV adenosine. He has myriad other medical problems though he is gradually improving. If he recovers, I would recommend catheter ablation of both his SVT and Atrial flutter. For now, his GI and infectious issues will need resolving. Consider switching anti-coagulation to a NOAC once his creatinine gets to baseline. Ok to continue his anti-arrhythmic drug therapy for now. If his SVT recurs and cannot be controlled, then would consider ablation before discharge from the hospital.  GregMikle Bosworth

## 2015-08-23 NOTE — Progress Notes (Signed)
Physical Therapy Treatment Patient Details Name: Frank Hoover MRN: [id number] DOB: 12-13-1950 Today's Date: 08/23/2015    History of Present Illness 65 year old male with PMH of GERD, OSA, COPD, admitted with dizziness, tachycardia and leg edema. Dx with acute cardiogenic/septic shock, acute diastolic CHF, A-flutter, intubated 6/13-6/18. Pt underwent TEE with cardioversion 6/20.    PT Comments    Mr. Freeney lethargic throughout session, occasionally closing eyes supine and sitting.  He demonstrates poor trunk control sitting EOB w/ posterior lean requiring close min guard>mod assist to maintain upright, fatiguing quickly.  Flexiseal loose and pt w/ BM at end of session, RN notified.  Pt will benefit from continued skilled PT services to increase functional independence and safety.   Follow Up Recommendations  CIR     Equipment Recommendations   (TBA)    Recommendations for Other Services       Precautions / Restrictions Precautions Precautions: Fall Restrictions Weight Bearing Restrictions: No    Mobility  Bed Mobility Overal bed mobility: Needs Assistance;+2 for physical assistance Bed Mobility: Supine to Sit;Sit to Supine     Supine to sit: Max assist;+2 for physical assistance;HOB elevated Sit to supine: Max assist;+2 for physical assistance   General bed mobility comments: pt assisted to EOB with extensive truncal assist and helicopter pivot using the pad.  Max assist managing Bil LEs and supporting trunk to return to supine.  Transfers                 General transfer comment: defered transfer or OOB today due to level of arousal and pt incontinent of stool w/ loose flexiseal.  RN notified.  Ambulation/Gait                 Stairs            Wheelchair Mobility    Modified Rankin (Stroke Patients Only)       Balance Overall balance assessment: Needs assistance Sitting-balance support: Single extremity supported;Feet  supported Sitting balance-Leahy Scale: Poor Sitting balance - Comments: Pt lethargic and demonstrates posterior lean.  Requires close min guard>mod assist to maintain upright.  Unable to perform LAQ or ankle pumps without losing his balance posteriorly.                            Cognition Arousal/Alertness: Lethargic Behavior During Therapy: WFL for tasks assessed/performed Overall Cognitive Status: Difficult to assess                      Exercises Low Level/ICU Exercises Ankle Circles/Pumps: PROM;Both;10 reps;Seated Quad Sets: AAROM;Both;5 reps;Seated    General Comments General comments (skin integrity, edema, etc.): VSS.        Pertinent Vitals/Pain Pain Assessment: Faces Faces Pain Scale: Hurts little more Pain Location: vague lower body Pain Descriptors / Indicators: Grimacing;Guarding Pain Intervention(s): Limited activity within patient's tolerance;Monitored during session;Repositioned    Home Living                      Prior Function            PT Goals (current goals can now be found in the care plan section) Acute Rehab PT Goals Patient Stated Goal: back home PT Goal Formulation: With patient Time For Goal Achievement: 09/05/15 Potential to Achieve Goals: Fair Progress towards PT goals: Not progressing toward goals - comment (due to lethargy)    Frequency  Min 3X/week  PT Plan Current plan remains appropriate    Co-evaluation             End of Session   Activity Tolerance: Patient limited by fatigue;Patient limited by lethargy Patient left: in bed;with call bell/phone within reach;with nursing/sitter in room     Time: 1412-1430 PT Time Calculation (min) (ACUTE ONLY): 18 min  Charges:  $Therapeutic Activity: 8-22 mins                    G Codes:      Collie Siad PT, DPT  Pager: [phone] Phone: [phone] 08/23/2015, 3:15 PM

## 2015-08-23 NOTE — Progress Notes (Signed)
Patient had recurrent SVT rate 180. BP 255 systolic.  With carotid massage rate slowed to 168 but did not break. Given 6 mg IV adenosine with prompt return to NSR  Will ask EP to evaluate for recurrent SVT and recent atrial flutter.  Jazmarie Biever Martinique MD, [facility]  08/23/2015 11:29 AM

## 2015-08-23 NOTE — Progress Notes (Signed)
ANTICOAGULATION CONSULT NOTE - Initial Consult  Pharmacy Consult for Coumadin Indication: atrial fibrillation  No Known Allergies  Patient Measurements: Height: 5\' 6"  (167.6 cm) Weight: 267 lb 4.8 oz (121.246 kg) IBW/kg (Calculated) : 63.8  Vital Signs: Temp: 98.2 F (36.8 C) (06/21 0413) Temp Source: Oral (06/21 0413) BP: 132/61 mmHg (06/21 0413) Pulse Rate: 177 (06/21 0700)  Labs:  Recent Labs  08/20/15 1800 08/21/15 0430  08/21/15 2122 08/22/15 0420 08/22/15 1030 08/23/15 0250  HGB 9.8* 9.8*  --   --   --   --  8.8*  HCT 30.1* 31.4*  --   --   --   --  27.8*  PLT 115* 148*  --   --   --   --  195  HEPARINUNFRC 0.20* 0.22*  < > 0.30 0.53  --  0.40  CREATININE  --  3.25*  --   --   --  3.13* 3.02*  < > = values in this interval not displayed.  Estimated Creatinine Clearance: 30.7 mL/min (by C-G formula based on Cr of 3.02).   Medical History: Past Medical History  Diagnosis Date  . Gout   . COPD (chronic obstructive pulmonary disease) ([place])   . Type 2 diabetes mellitus ([place])   . GERD (gastroesophageal reflux disease)   . Essential hypertension   . Hyperlipemia   . OA (osteoarthritis)   . Gastroparesis   . Adenomatous colon polyp 10/30/09    Serrated adenoma removed during Colonoscopy   . Diverticulosis of colon   . CKD (chronic kidney disease) stage 3, GFR 30-59 ml/min     Medications:  Prescriptions prior to admission  Medication Sig Dispense Refill Last Dose  . acetaminophen (TYLENOL) 500 MG tablet Take 1 tablet (500 mg total) by mouth every 6 (six) hours as needed for moderate pain. 30 tablet 0 unknown  . albuterol (PROAIR HFA) 108 (90 BASE) MCG/ACT inhaler Inhale 2 puffs into the lungs every 6 (six) hours as needed for wheezing or shortness of breath.    unknown  . allopurinol (ZYLOPRIM) 300 MG tablet Take 300 mg by mouth daily.     unknown at Unknown time  . amLODipine (NORVASC) 10 MG tablet Take 10 mg by mouth daily.   unknown at Unknown time  .  atorvastatin (LIPITOR) 20 MG tablet Take 20 mg by mouth daily.   unknown at Unknown time  . Cholecalciferol (VITAMIN D PO) Take 1,000 Units by mouth daily.   unknown at Unknown time  . cloNIDine (CATAPRES) 0.2 MG tablet Take 0.2 mg by mouth 3 (three) times daily.    unknown at Unknown time  . colchicine 0.6 MG tablet Take 0.6 mg by mouth daily.   unknown at Unknown time  . furosemide (LASIX) 40 MG tablet Take 1 tablet (40 mg total) by mouth daily. (Patient taking differently: Take 40-80 mg by mouth 2 (two) times daily. 2 tablets in the morning and 1 tablet in the evening.) 30 tablet 12 unknown at Unknown time  . gabapentin (NEURONTIN) 400 MG capsule Take 400 mg by mouth 2 (two) times daily. Takes at noon & bedtime   unknown at Unknown time  . glimepiride (AMARYL) 2 MG tablet Take 2 mg by mouth daily before breakfast.     unknown at Unknown time  . HUMALOG KWIKPEN 100 UNIT/ML KiwkPen Inject 1-54 Units into the skin 3 (three) times daily before meals. Per sliding scale   unknown  . hydrALAZINE (APRESOLINE) 50 MG tablet Take 1  tablet (50 mg total) by mouth 2 (two) times daily. 60 tablet 12 unknown at Unknown time  . LINZESS 290 MCG CAPS capsule TAKE 1 CAPSULE ONCE DAILY 30 MINUTES BEFORE MEALS 90 capsule 3 unknown at Unknown time  . metoprolol (LOPRESSOR) 100 MG tablet Take 100 mg by mouth 2 (two) times daily.    unknown at Unknown time  . omeprazole (PRILOSEC) 20 MG capsule Take 20 mg by mouth daily.   unknown at Unknown time  . TOUJEO SOLOSTAR 300 UNIT/ML SOPN Inject 15 Units into the skin at bedtime.   unknown at Unknown time  . zolpidem (AMBIEN) 10 MG tablet Take 10 mg by mouth at bedtime.    unknown at Unknown time  . HYDROcodone-acetaminophen (NORCO/VICODIN) 5-325 MG per tablet Take 1 tablet by mouth every 6 (six) hours as needed for moderate pain.    unknown  . lanolin ointment Apply 1 application topically 2 (two) times daily as needed for dry skin.   unknown   Scheduled:  . adenosine      .  adenosine      . amiodarone  400 mg Oral BID  . feeding supplement (GLUCERNA SHAKE)  237 mL Oral BID BM  . furosemide  40 mg Oral BID  . insulin aspart  0-15 Units Subcutaneous TID WC  . insulin glargine  25 Units Subcutaneous Daily  . metoprolol tartrate  50 mg Oral BID  . pantoprazole  40 mg Oral Daily  . sodium chloride flush  3 mL Intravenous Q12H   Infusions:  . sodium chloride    . sodium chloride Stopped (08/21/15 1617)  . heparin 1,700 Units/hr (08/23/15 0400)    Assessment: 65yo male admitted w/ new-onset Aflutter w/ RVR, on heparin gtt, now to begin Coumadin.  Goal of Therapy:  INR 2-3 Monitor platelets by anticoagulation protocol: Yes   Plan:  Will give Coumadin 10mg  po x1 today and monitor INR for dose adjustments; will begin Coumadin education.  Wynona Neat, PharmD, BCPS  08/23/2015,7:38 AM

## 2015-08-23 NOTE — Progress Notes (Signed)
Dr. Martinique updated on pt.  MD on the way

## 2015-08-24 DIAGNOSIS — D62 Acute posthemorrhagic anemia: Secondary | ICD-10-CM | POA: Diagnosis present

## 2015-08-24 DIAGNOSIS — I471 Supraventricular tachycardia: Secondary | ICD-10-CM | POA: Diagnosis not present

## 2015-08-24 DIAGNOSIS — R933 Abnormal findings on diagnostic imaging of other parts of digestive tract: Secondary | ICD-10-CM | POA: Diagnosis present

## 2015-08-24 DIAGNOSIS — R5381 Other malaise: Secondary | ICD-10-CM

## 2015-08-24 DIAGNOSIS — K921 Melena: Secondary | ICD-10-CM | POA: Diagnosis present

## 2015-08-24 LAB — CBC
HCT: 27.6 % — ABNORMAL LOW (ref 39.0–52.0)
HCT: 26.4 % — ABNORMAL LOW (ref 39.0–52.0)
Hemoglobin: 8.8 g/dL — ABNORMAL LOW (ref 13.0–17.0)
Hemoglobin: 8.5 g/dL — ABNORMAL LOW (ref 13.0–17.0)
MCH: 27.8 pg (ref 26.0–34.0)
MCH: 27.8 pg (ref 26.0–34.0)
MCHC: 31.9 g/dL (ref 30.0–36.0)
MCHC: 32.2 g/dL (ref 30.0–36.0)
MCV: 87.1 fL (ref 78.0–100.0)
MCV: 86.3 fL (ref 78.0–100.0)
Platelets: 232 10*3/uL (ref 150–400)
Platelets: 229 10*3/uL (ref 150–400)
RBC: 3.17 MIL/uL — ABNORMAL LOW (ref 4.22–5.81)
RBC: 3.06 MIL/uL — ABNORMAL LOW (ref 4.22–5.81)
RDW: 15.7 % — ABNORMAL HIGH (ref 11.5–15.5)
RDW: 15.6 % — ABNORMAL HIGH (ref 11.5–15.5)
WBC: 10.4 10*3/uL (ref 4.0–10.5)
WBC: 9.4 10*3/uL (ref 4.0–10.5)

## 2015-08-24 LAB — GLUCOSE, CAPILLARY
Glucose-Capillary: 182 mg/dL — ABNORMAL HIGH (ref 65–99)
Glucose-Capillary: 211 mg/dL — ABNORMAL HIGH (ref 65–99)
Glucose-Capillary: 145 mg/dL — ABNORMAL HIGH (ref 65–99)
Glucose-Capillary: 182 mg/dL — ABNORMAL HIGH (ref 65–99)

## 2015-08-24 LAB — GASTROINTESTINAL PANEL BY PCR, STOOL (REPLACES STOOL CULTURE)

## 2015-08-24 LAB — BASIC METABOLIC PANEL
Anion gap: 8 (ref 5–15)
BUN: 61 mg/dL — ABNORMAL HIGH (ref 6–20)
CO2: 25 mmol/L (ref 22–32)
Calcium: 8 mg/dL — ABNORMAL LOW (ref 8.9–10.3)
Chloride: 102 mmol/L (ref 101–111)
Creatinine, Ser: 2.73 mg/dL — ABNORMAL HIGH (ref 0.61–1.24)
GFR calc Af Amer: 27 mL/min — ABNORMAL LOW (ref >60)
GFR calc non Af Amer: 23 mL/min — ABNORMAL LOW (ref >60)
Glucose, Bld: 161 mg/dL — ABNORMAL HIGH (ref 65–99)
Potassium: 3.1 mmol/L — ABNORMAL LOW (ref 3.5–5.1)
Sodium: 135 mmol/L (ref 135–145)

## 2015-08-24 LAB — ABO/RH: ABO/RH(D): B POS

## 2015-08-24 LAB — PROTIME-INR
INR: 1.7 — ABNORMAL HIGH (ref 0.00–1.49)
Prothrombin Time: 20 seconds — ABNORMAL HIGH (ref 11.6–15.2)

## 2015-08-24 LAB — MAGNESIUM: Magnesium: 1.8 mg/dL (ref 1.7–2.4)

## 2015-08-24 LAB — HEPARIN LEVEL (UNFRACTIONATED): Heparin Unfractionated: 0.45 IU/mL (ref 0.30–0.70)

## 2015-08-24 MED ORDER — GLUCERNA SHAKE PO LIQD
237.0000 mL | Freq: Three times a day (TID) | ORAL | Status: DC
  Administered 2015-08-24 – 2015-08-27 (×5): 237 mL via ORAL

## 2015-08-24 MED ORDER — HYDRALAZINE HCL 25 MG PO TABS
25.0000 mg | ORAL_TABLET | Freq: Three times a day (TID) | ORAL | Status: DC
  Administered 2015-08-24 (×3): 25 mg via ORAL
  Filled 2015-08-24 (×4): qty 1

## 2015-08-24 MED ORDER — WARFARIN SODIUM 5 MG PO TABS
5.0000 mg | ORAL_TABLET | Freq: Once | ORAL | Status: AC
  Administered 2015-08-24: 5 mg via ORAL
  Filled 2015-08-24: qty 1

## 2015-08-24 MED ORDER — ISOSORBIDE MONONITRATE ER 30 MG PO TB24
30.0000 mg | ORAL_TABLET | Freq: Every day | ORAL | Status: DC
  Administered 2015-08-24: 30 mg via ORAL
  Filled 2015-08-24: qty 1

## 2015-08-24 MED ORDER — POTASSIUM CHLORIDE CRYS ER 20 MEQ PO TBCR
30.0000 meq | EXTENDED_RELEASE_TABLET | ORAL | Status: AC
  Administered 2015-08-24 (×2): 30 meq via ORAL
  Filled 2015-08-24 (×2): qty 1

## 2015-08-24 MED ORDER — FUROSEMIDE 40 MG PO TABS
40.0000 mg | ORAL_TABLET | Freq: Every day | ORAL | Status: DC
  Administered 2015-08-24 – 2015-09-04 (×11): 40 mg via ORAL
  Filled 2015-08-24 (×11): qty 1

## 2015-08-24 MED ORDER — POTASSIUM CHLORIDE CRYS ER 20 MEQ PO TBCR
40.0000 meq | EXTENDED_RELEASE_TABLET | Freq: Once | ORAL | Status: AC
  Administered 2015-08-24: 40 meq via ORAL
  Filled 2015-08-24: qty 2

## 2015-08-24 MED ORDER — DEXTROSE 5 % IV SOLN
2.0000 g | INTRAVENOUS | Status: AC
  Administered 2015-08-24 – 2015-08-30 (×7): 2 g via INTRAVENOUS
  Filled 2015-08-24 (×7): qty 2

## 2015-08-24 MED ORDER — MAGNESIUM SULFATE IN D5W 1-5 GM/100ML-% IV SOLN
1.0000 g | Freq: Once | INTRAVENOUS | Status: AC
  Administered 2015-08-24: 1 g via INTRAVENOUS
  Filled 2015-08-24: qty 100

## 2015-08-24 MED ORDER — LOPERAMIDE HCL 2 MG PO CAPS
2.0000 mg | ORAL_CAPSULE | ORAL | Status: DC | PRN
  Administered 2015-08-24: 2 mg via ORAL
  Filled 2015-08-24: qty 1

## 2015-08-24 NOTE — Progress Notes (Signed)
ANTICOAGULATION CONSULT NOTE - Follow Up Consult  Pharmacy Consult for Heparin, coumadin  Indication: atrial fibrillation  No Known Allergies  Patient Measurements: Height: 5\' 6"  (167.6 cm) Weight: 263 lb 6.4 oz (119.477 kg) IBW/kg (Calculated) : 63.8 Heparin Dosing Weight: 94 kg  Vital Signs: Temp: 98.7 F (37.1 C) (06/22 0800) Temp Source: Oral (06/22 0800) BP: 155/72 mmHg (06/22 0800) Pulse Rate: 73 (06/22 0800)  Labs:  Recent Labs  08/22/15 0420 08/22/15 1030 08/23/15 0250 08/23/15 0853 08/24/15 0247  HGB  --   --  8.8*  --  8.8*  HCT  --   --  27.8*  --  27.6*  PLT  --   --  195  --  232  LABPROT  --   --   --  16.9* 20.0*  INR  --   --   --  1.36 1.70*  HEPARINUNFRC 0.53  --  0.40  --  0.45  CREATININE  --  3.13* 3.02*  --  2.73*    Estimated Creatinine Clearance: 33.7 mL/min (by C-G formula based on Cr of 2.73).  . sodium chloride    . sodium chloride Stopped (08/21/15 1617)  . heparin 1,700 Units/hr (08/23/15 1941)    Assessment: 59 YOM admitted 08/14/2015 with newly diagnosed AFlutter with RVR. Pharmacy consulted to dose heparin and coumadin . Heparin levels remain in goal range on heparin at 1700 units/hr. CBC stable. INR up to 1.7 after one dose of coumadin.   Goal of Therapy:  Heparin level 0.3-0.7 units/ml Monitor platelets by anticoagulation protocol: Yes   Plan:  Continue IV heparin at current rate. Daily heparin levels and CBC  Coumadin 5mg  po today Daily PT/INR  Hildred Laser, Pharm D 08/24/2015 8:46 AM

## 2015-08-24 NOTE — Progress Notes (Addendum)
Latest hgb-8.5/ hct-26.4 md made aware thru text page,awaiting return call.

## 2015-08-24 NOTE — Progress Notes (Addendum)
PROGRESS NOTE    Frank Hoover  XNA:[id number] DOB: Dec 31, 1950 DOA: 08/14/2015 PCP: Alonza Bogus, MD    Brief Narrative:  65 year old male was originally seen by his nephrologist in [place], was found to be in A. fib with RVR , admitted to any pain on 08/15/15 , noted to be in a flutter with RVR which is new diagnosis, 2-D echo showing EF 25%, and developed septic and cardiogenic shock, febrile 104, transferred to PCCM at [facility], treated for CAP/aspiration pneumonia and bacteremia, requiring  pressor support, She required intubation by PCCM, successfully extubated on 6/19 Much improved and was transferred to [facility] on 6/20.  Patient was seen by cardiology and EP or drink atrial flutter, and SVT, on heparin drip, transitioned to by mouth warfarin, with significant anemia, Hemoccult-positive stool.   Assessment & Plan:   Principal Problem:   Atrial flutter with rapid ventricular response (HCC) Active Problems:   GERD   DM (diabetes mellitus) (HCC)   HTN (hypertension)   Hyperlipemia   Sleep apnea   Systolic CHF ([place])   Acute respiratory failure (HCC)   Septic shock (HCC)   Acute encephalopathy   Acute kidney injury ([place])   Acute systolic heart failure (HCC)   SVT (supraventricular tachycardia) (HCC)   Debility   Acute hypoxic resp failure - With history of OSA, COPD, and cardiogenic/septic shock - Bipap Qhs for sleep apnea - improving, currently on 2 L nasal cannula  Septic shock with citrobacter bacteremia  - Shock resolved  Citrobacter bacteremia in the setting of UTI - Blood cultures and urine culture growing Citrobacter, treated with total of 8 days of Antibiotics, to resume Rocephin today for total of 2 weeks treatment.  HCAP - Treated  Cardiogenic shock/acute  systolic CHF with EF 54% - Currently off pressors, management per cardiology, on beta blockers and hydralazine - Diuresis per cardiology, appears to be improving, not on ACEI/ARB/spiral due to  ARF.  Atrial fib - Management per cardiology, status post s/p  TEE/DCCV 08/22/15, in NSR, on amiodarone and beta blockers - On IV heparin for anticoagulationassumed on warfarint NOAC candidate given his renal failure  Episode of SVT on 08/23/18 - required Cardizem as such, IV adenosine, EP consult appreciated  HTN - Blood pressure acceptable, clonidine wean down, started on metoprolol  AKI on CKD  - Continue to monitor, improving despite diuresis  Anemia of CD - Hemoglobin trending down slowly , he is a Hemoccult-positive, but no significant melena, I think this is most likely related to  proctocolitis, continue to monitor closely, GI consulted.  DM  - Better controlled after increasing Lantus, continue with insulin sliding scale .   Urinary retention -foley per urology, to remain for several weeks to allow urethra healing better urology recommendation.  Acute metabolic encephalopathy - Improving    DVT prophylaxis:  Heparin gtt  Code Status: Full Code   Family Communication: Wife at bedside  Disposition Plan:  Remain in SDU, will need SNF placement when stable, I would anticipate in 2-3 days.   Consultants:   Cards  PCCM  Urology  Inpatient rehabilitation  Procedures:  6/13 R Femoral CVL >> 6/13 6/13 ETT >> 6/18 6/13 Lt radial aline >> 6/17 6/13 RIJ CVL >>>plan out 6/19    Subjective: Feeling better, still having diarrhea..  Objective: Filed Vitals:   08/23/15 2100 08/24/15 0003 08/24/15 0406 08/24/15 0800  BP: 137/60 126/67 146/68 155/72  Pulse: 73 66 68 73  Temp:  98 F (36.7 C) 98.2  F (36.8 C) 98.7 F (37.1 C)  TempSrc:  Oral Oral Oral  Resp: 24 23 23 21   Height:      Weight:   119.477 kg (263 lb 6.4 oz)   SpO2: 100% 100% 98% 100%    Intake/Output Summary (Last 24 hours) at 08/24/15 1044 Last data filed at 08/24/15 1000  Gross per 24 hour  Intake    918 ml  Output   2370 ml  Net  -1452 ml   Filed Weights   08/22/15 0429  08/23/15 0413 08/24/15 0406  Weight: 121.609 kg (268 lb 1.6 oz) 121.246 kg (267 lb 4.8 oz) 119.477 kg (263 lb 6.4 oz)    Examination:  General exam: Awake, alert,  Respiratory system: diminshed, no wheezing, Respiratory effort normal. Cardiovascular system: S1 & S2 heard, RRR. No JVD, murmurs, rubs, gallops or clicks. No pedal edema. Gastrointestinal system: Abdomen is nondistended, soft and nontender, obese. Lower extremity: + edema, pulses felt bilaterally     Data Reviewed: I have personally reviewed following labs and imaging studies  CBC:  Recent Labs Lab 08/20/15 0430 08/20/15 1800 08/21/15 0430 08/23/15 0250 08/24/15 0247  WBC 6.6 8.0 10.2 10.2 10.4  HGB 9.4* 9.8* 9.8* 8.8* 8.8*  HCT 29.4* 30.1* 31.4* 27.8* 27.6*  MCV 85.0 85.3 86.5 87.7 87.1  PLT 88* 115* 148* 195 500   Basic Metabolic Panel:  Recent Labs Lab 08/18/15 1145  08/20/15 0430 08/20/15 1335 08/21/15 0430 08/22/15 1030 08/23/15 0250 08/24/15 0247  NA 134*  < > 139  --  144 138 138 135  K 3.5  < > 3.4*  --  3.2* 4.7 3.6 3.1*  CL 97*  < > 101  --  105 104 102 102  CO2 25  < > 25  --  27 28 27 25   GLUCOSE 132*  < > 256*  --  281* 263* 248* 161*  BUN 79*  < > 77*  --  77* 74* 73* 61*  CREATININE 5.34*  < > 3.85*  --  3.25* 3.13* 3.02* 2.73*  CALCIUM 6.8*  < > 8.3*  --  8.6* 8.2* 8.1* 8.0*  MG  --   --  2.5* 2.5* 2.3  --   --  1.8  PHOS 5.7*  --  4.2  --  3.1  --   --   --   < > = values in this interval not displayed. GFR: Estimated Creatinine Clearance: 33.7 mL/min (by C-G formula based on Cr of 2.73). Liver Function Tests:  Recent Labs Lab 08/18/15 1145  ALBUMIN 1.5*   No results for input(s): LIPASE, AMYLASE in the last 168 hours. No results for input(s): AMMONIA in the last 168 hours. Coagulation Profile:  Recent Labs Lab 08/23/15 0853 08/24/15 0247  INR 1.36 1.70*   Cardiac Enzymes: No results for input(s): CKTOTAL, CKMB, CKMBINDEX, TROPONINI in the last 168 hours. BNP  (last 3 results) No results for input(s): PROBNP in the last 8760 hours. HbA1C: No results for input(s): HGBA1C in the last 72 hours. CBG:  Recent Labs Lab 08/23/15 0816 08/23/15 1122 08/23/15 1653 08/23/15 2151 08/24/15 0825  GLUCAP 227* 210* 185* 110* 182*   Lipid Profile: No results for input(s): CHOL, HDL, LDLCALC, TRIG, CHOLHDL, LDLDIRECT in the last 72 hours. Thyroid Function Tests: No results for input(s): TSH, T4TOTAL, FREET4, T3FREE, THYROIDAB in the last 72 hours. Anemia Panel: No results for input(s): VITAMINB12, FOLATE, FERRITIN, TIBC, IRON, RETICCTPCT in the last 72 hours. Urine analysis:  Component Value Date/Time   COLORURINE AMBER* 08/14/2015 2125   APPEARANCEUR HAZY* 08/14/2015 2125   LABSPEC 1.015 08/14/2015 2125   PHURINE 5.0 08/14/2015 2125   GLUCOSEU 100* 08/14/2015 2125   HGBUR LARGE* 08/14/2015 2125   BILIRUBINUR NEGATIVE 08/14/2015 2125   KETONESUR NEGATIVE 08/14/2015 2125   PROTEINUR 100* 08/14/2015 2125   UROBILINOGEN 0.2 12/02/2013 1739   NITRITE NEGATIVE 08/14/2015 2125   LEUKOCYTESUR MODERATE* 08/14/2015 2125   Sepsis Labs: @LABRCNTIP (procalcitonin:4,lacticidven:4)  ) Recent Results (from the past 240 hour(s))  MRSA PCR Screening     Status: None   Collection Time: 08/14/15  9:25 PM  Result Value Ref Range Status   MRSA by PCR NEGATIVE NEGATIVE Final    Comment:        The GeneXpert MRSA Assay (FDA approved for NASAL specimens only), is one component of a comprehensive MRSA colonization surveillance program. It is not intended to diagnose MRSA infection nor to guide or monitor treatment for MRSA infections.   Culture, Urine     Status: Abnormal   Collection Time: 08/14/15  9:25 PM  Result Value Ref Range Status   Specimen Description URINE, CLEAN CATCH  Final   Special Requests NONE  Final   Culture >=100,000 COLONIES/mL CITROBACTER KOSERI (A)  Final   Report Status 08/17/2015 FINAL  Final   Organism ID, Bacteria  CITROBACTER KOSERI (A)  Final      Susceptibility   Citrobacter koseri - MIC*    CEFAZOLIN <=4 SENSITIVE Sensitive     CEFTRIAXONE <=1 SENSITIVE Sensitive     CIPROFLOXACIN <=0.25 SENSITIVE Sensitive     GENTAMICIN <=1 SENSITIVE Sensitive     IMIPENEM <=0.25 SENSITIVE Sensitive     NITROFURANTOIN <=16 SENSITIVE Sensitive     TRIMETH/SULFA <=20 SENSITIVE Sensitive     * >=100,000 COLONIES/mL CITROBACTER KOSERI  Culture, blood (routine x 2)     Status: Abnormal   Collection Time: 08/14/15 11:30 PM  Result Value Ref Range Status   Specimen Description BLOOD RIGHT HAND  Final   Special Requests BOTTLES DRAWN AEROBIC AND ANAEROBIC 4CC EACH  Final   Culture  Setup Time   Final    GRAM NEGATIVE RODS RECOVERED FROM THE AEROBIC BOTTLE Gram Stain Report Called to,Read Back By and Verified With: [place]. AT 1406 ON 08/15/2015 BY BAUGHAM,M. Performed at [facility] Organism ID to follow CRITICAL RESULT CALLED TO, READ BACK BY AND VERIFIED WITH: A MEYER PHARMD 1935 08/15/15 A BROWNING GRAM NEGATIVE RODS RECOVERED FROM THE ANAEROBIC BOTTLE  AT [facility]  GRAM STAIN RESULTS PREVIOUSLY CALLED BY [facility]    Culture CITROBACTER KOSERI (A)  Final   Report Status 08/19/2015 FINAL  Final   Organism ID, Bacteria CITROBACTER KOSERI  Final      Susceptibility   Citrobacter koseri - MIC*    CEFAZOLIN <=4 SENSITIVE Sensitive     CEFEPIME <=1 SENSITIVE Sensitive     CEFTAZIDIME <=1 SENSITIVE Sensitive     CEFTRIAXONE <=1 SENSITIVE Sensitive     CIPROFLOXACIN <=0.25 SENSITIVE Sensitive     GENTAMICIN <=1 SENSITIVE Sensitive     IMIPENEM <=0.25 SENSITIVE Sensitive     TRIMETH/SULFA <=20 SENSITIVE Sensitive     PIP/TAZO <=4 SENSITIVE Sensitive     * CITROBACTER KOSERI  Blood Culture ID Panel (Reflexed)     Status: Abnormal   Collection Time: 08/14/15 11:30 PM  Result Value Ref Range Status   Enterococcus species NOT DETECTED NOT DETECTED Final   Vancomycin resistance NOT DETECTED NOT  DETECTED Final    Listeria monocytogenes NOT DETECTED NOT DETECTED Final   Staphylococcus species NOT DETECTED NOT DETECTED Final   Staphylococcus aureus NOT DETECTED NOT DETECTED Final   Methicillin resistance NOT DETECTED NOT DETECTED Final   Streptococcus species NOT DETECTED NOT DETECTED Final   Streptococcus agalactiae NOT DETECTED NOT DETECTED Final   Streptococcus pneumoniae NOT DETECTED NOT DETECTED Final   Streptococcus pyogenes NOT DETECTED NOT DETECTED Final   Acinetobacter baumannii NOT DETECTED NOT DETECTED Final   Enterobacteriaceae species DETECTED (A) NOT DETECTED Final    Comment: CRITICAL RESULT CALLED TO, READ BACK BY AND VERIFIED WITH: A MEYER PHARMD 1935 08/15/15 A BROWNING    Enterobacter cloacae complex NOT DETECTED NOT DETECTED Final   Escherichia coli NOT DETECTED NOT DETECTED Final   Klebsiella oxytoca NOT DETECTED NOT DETECTED Final   Klebsiella pneumoniae NOT DETECTED NOT DETECTED Final   Proteus species NOT DETECTED NOT DETECTED Final   Serratia marcescens NOT DETECTED NOT DETECTED Final   Carbapenem resistance NOT DETECTED NOT DETECTED Final   Haemophilus influenzae NOT DETECTED NOT DETECTED Final   Neisseria meningitidis NOT DETECTED NOT DETECTED Final   Pseudomonas aeruginosa NOT DETECTED NOT DETECTED Final   Candida albicans NOT DETECTED NOT DETECTED Final   Candida glabrata NOT DETECTED NOT DETECTED Final   Candida krusei NOT DETECTED NOT DETECTED Final   Candida parapsilosis NOT DETECTED NOT DETECTED Final   Candida tropicalis NOT DETECTED NOT DETECTED Final    Comment: Performed at [facility]  Culture, blood (routine x 2)     Status: None   Collection Time: 08/14/15 11:45 PM  Result Value Ref Range Status   Specimen Description BLOOD LEFT HAND  Final   Special Requests BOTTLES DRAWN AEROBIC AND ANAEROBIC 3CC EACH  Final   Culture NO GROWTH 5 DAYS  Final   Report Status 08/19/2015 FINAL  Final  C difficile quick scan w PCR reflex     Status: None    Collection Time: 08/23/15  4:14 PM  Result Value Ref Range Status   C Diff antigen NEGATIVE NEGATIVE Final   C Diff toxin NEGATIVE NEGATIVE Final   C Diff interpretation Negative for toxigenic C. difficile  Final      Anti-infectives    Start     Dose/Rate Route Frequency Ordered Stop   08/20/15 0000  cefTRIAXone (ROCEPHIN) 2 g in dextrose 5 % 50 mL IVPB  Status:  Discontinued     2 g 100 mL/hr over 30 Minutes Intravenous Every 24 hours 08/19/15 1112 08/22/15 1310   08/18/15 1100  cefTAZidime (FORTAZ) 2 g in dextrose 5 % 50 mL IVPB  Status:  Discontinued     2 g 100 mL/hr over 30 Minutes Intravenous Every 24 hours 08/18/15 1039 08/19/15 1112   08/17/15 1800  vancomycin (VANCOCIN) 1,500 mg in sodium chloride 0.9 % 500 mL IVPB  Status:  Discontinued     1,500 mg 250 mL/hr over 120 Minutes Intravenous Every 48 hours 08/15/15 1636 08/15/15 1956   08/16/15 2100  ceFEPIme (MAXIPIME) 1 g in dextrose 5 % 50 mL IVPB  Status:  Discontinued     1 g 100 mL/hr over 30 Minutes Intravenous Every 24 hours 08/15/15 1957 08/16/15 1415   08/16/15 2100  ceFEPIme (MAXIPIME) 2 g in dextrose 5 % 50 mL IVPB  Status:  Discontinued     2 g 100 mL/hr over 30 Minutes Intravenous Every 24 hours 08/16/15 1415 08/16/15 1741   08/16/15 1900  piperacillin-tazobactam (ZOSYN) IVPB 2.25 g  Status:  Discontinued     2.25 g 100 mL/hr over 30 Minutes Intravenous Every 8 hours 08/16/15 1809 08/18/15 1014   08/15/15 2100  ceFEPIme (MAXIPIME) 2 g in dextrose 5 % 50 mL IVPB     2 g 100 mL/hr over 30 Minutes Intravenous  Once 08/15/15 1957 08/15/15 2359   08/15/15 1730  vancomycin (VANCOCIN) 2,000 mg in sodium chloride 0.9 % 500 mL IVPB     2,000 mg 250 mL/hr over 120 Minutes Intravenous  Once 08/15/15 1636 08/15/15 2000   08/15/15 0900  piperacillin-tazobactam (ZOSYN) IVPB 3.375 g  Status:  Discontinued     3.375 g 12.5 mL/hr over 240 Minutes Intravenous Every 8 hours 08/15/15 0856 08/15/15 1956   08/15/15 0100   vancomycin (VANCOCIN) IVPB 1000 mg/200 mL premix     1,000 mg 200 mL/hr over 60 Minutes Intravenous STAT 08/15/15 0048 08/15/15 0206   08/14/15 2330  vancomycin (VANCOCIN) IVPB 1000 mg/200 mL premix     1,000 mg 200 mL/hr over 60 Minutes Intravenous STAT 08/14/15 2316 08/15/15 0041   08/14/15 2330  piperacillin-tazobactam (ZOSYN) IVPB 3.375 g     3.375 g 12.5 mL/hr over 240 Minutes Intravenous STAT 08/14/15 2317 08/15/15 0341       Radiology Studies: No results found.      Scheduled Meds: . amiodarone  400 mg Oral BID  . feeding supplement (GLUCERNA SHAKE)  237 mL Oral BID BM  . furosemide  40 mg Oral Daily  . hydrALAZINE  25 mg Oral Q8H  . insulin aspart  0-15 Units Subcutaneous TID WC  . insulin glargine  32 Units Subcutaneous Daily  . isosorbide mononitrate  30 mg Oral Daily  . metoprolol tartrate  50 mg Oral BID  . pantoprazole  40 mg Oral Daily  . potassium chloride  30 mEq Oral Q4H  . sodium chloride flush  3 mL Intravenous Q12H  . warfarin  5 mg Oral ONCE-1800  . Warfarin - Pharmacist Dosing Inpatient   Does not apply q1800   Continuous Infusions: . sodium chloride    . sodium chloride Stopped (08/21/15 1617)  . heparin 1,700 Units/hr (08/23/15 1941)     LOS: 10 days    Time spent: 25 min    Alyria Krack, MD [facility] Pager [phone]  If 7PM-7AM, please contact night-coverage [email or web address] Password [facility] 08/24/2015, 10:44 AM

## 2015-08-24 NOTE — Consult Note (Signed)
[facility] Consult: 11:28 AM 08/24/2015  LOS: 10 days    Referring Provider: Dr Waldron Labs  Primary Care Physician:  Alonza Bogus, MD Primary Gastroenterologist:  Dr. Sydell Axon     Reason for Consultation:  Anemia, FOBT +   HPI: Frank Hoover is a 65 y.o. male.  Has COPD.  Obesity.   DM 2 on insulin. CKD stage 3.  HTN. Gout. GERD. Constipation on Linzess. Gastroparesis (GES 10/2009).  Diverticulosis.   A serrated adenomatous colon polyp inn 10/2009.  Attempted surveillance colonoscopies: 09/18/2012, 04/08/13, 06/22/14.  All were severely limited by poor prep.  The best, but by no means adequate, exam was in 2015 and MD noted long, redundant colon, he also removed tubular adenoma polyp from mid ascending colon.     Recent issues with tachycardia, transferred from [place] ED on 6/12 with fever to 104, shaking chills, diminished mentation.  After testing found to have A flutter with RVR, EF 25%: both new.  Hypotension with mean arterial pressure to 50s and 60s.  AKI.  Grew citrobacter from urine and blood. ? If he aspirated emesis?. Intubated 6/13 - 6/18 and placed on pressor 6/13 - 6/17 due to resp distress.  Heparin held briefly due thrombocytopenia (felt due to sepsis) and then restarted.  S/p TEE/cardioversion 6/20.    Hgb has steadily drifted from ~ 13.5 early on to 8.8 today.  MCV normal.  WBC maxed at 17.7 but now normal.  LFTs slightly elevated, normal at arrival.  Albumin of 1.5 sugg of malnutrition.  Ct abd/pelvis without contrast done on 6/14 for abdominal bloating:  Thickening of the sigmoid colon and rectal wall with surrounding inflammatory changes in the adjacent mesocolon/mesorectum, concerning for distal proctocolitis. Liver, GB ok.  Pancreatic atrophy. Perinephric stranding bil.  Atherosclerotic  calcifications throughout the abdominal and pelvic vasculature, without definite aneurysm. No lymphadenopathy noted in the abdomen or pelvis.  Atherosclerosis, including right coronary.  Cardiomegaly.  Pt was having brown, loose stools. Stool c diff toxin and Ag negative on 6/21.  Stool pathogen panel in process.  Flexiseal placed on 6/21, may have had flexiseal for a few days while in ICU as there is a 6/15 order to place a flexiseal.  On /21 at 2000, brown, red, bloody type appearance first noted.   He is having lower abdominal pain, worse on right side.  Some nausea/queasy but no emesis.  No heartburn or indigestion.  No dysphagia.  No previous bloody stool.  SOB and cough improved. At home had good appetite and no weight loss.      Past Medical History  Diagnosis Date  . Gout   . COPD (chronic obstructive pulmonary disease) ([place])   . Type 2 diabetes mellitus ([place])   . GERD (gastroesophageal reflux disease)   . Essential hypertension   . Hyperlipemia   . OA (osteoarthritis)   . Gastroparesis   . Adenomatous colon polyp 10/30/09    Serrated adenoma removed during Colonoscopy   . Diverticulosis of colon   . CKD (chronic kidney disease) stage 3, GFR 30-59  ml/min     Past Surgical History  Procedure Laterality Date  . Tonsillectomy    . Cataract extraction Right   . Esophagogastroduodenoscopy  10/30/09    [id number]   . Colonoscopy  10/30/09    [id number] diverticulum/serrated adenoma from ICV, next colonoscopy due 10/2012  . Colonoscopy N/A 09/18/2012    [id number] mucosa that was seen appeared normal, however most of it was not seen due to be very poor prep  . Colonoscopy N/A 04/08/2013    [id number] polyp removed/inadequate preparation  . Colonoscopy N/A 06/22/2014    Procedure: COLONOSCOPY;  Surgeon: Daneil Dolin, MD;  Location: [facility];  Service: Endoscopy;  Laterality: N/A;  115   . Tee without cardioversion N/A 08/22/2015    Procedure: TRANSESOPHAGEAL ECHOCARDIOGRAM  (TEE);  Surgeon: Larey Dresser, MD;  Location: [place];  Service: Cardiovascular;  Laterality: N/A;  . Cardioversion N/A 08/22/2015    Procedure: CARDIOVERSION;  Surgeon: Larey Dresser, MD;  Location: [place];  Service: Cardiovascular;  Laterality: N/A;    Prior to Admission medications   Medication Sig Start Date End Date Taking? Authorizing Provider  acetaminophen (TYLENOL) 500 MG tablet Take 1 tablet (500 mg total) by mouth every 6 (six) hours as needed for moderate pain. 12/05/13  Yes Sinda Du, MD  albuterol (PROAIR HFA) 108 (90 BASE) MCG/ACT inhaler Inhale 2 puffs into the lungs every 6 (six) hours as needed for wheezing or shortness of breath.    Yes Historical Provider, MD  allopurinol (ZYLOPRIM) 300 MG tablet Take 300 mg by mouth daily.     Yes Historical Provider, MD  amLODipine (NORVASC) 10 MG tablet Take 10 mg by mouth daily. 06/06/15  Yes Historical Provider, MD  atorvastatin (LIPITOR) 20 MG tablet Take 20 mg by mouth daily.   Yes Historical Provider, MD  Cholecalciferol (VITAMIN D PO) Take 1,000 Units by mouth daily.   Yes Historical Provider, MD  cloNIDine (CATAPRES) 0.2 MG tablet Take 0.2 mg by mouth 3 (three) times daily.    Yes Historical Provider, MD  colchicine 0.6 MG tablet Take 0.6 mg by mouth daily.   Yes Historical Provider, MD  furosemide (LASIX) 40 MG tablet Take 1 tablet (40 mg total) by mouth daily. Patient taking differently: Take 40-80 mg by mouth 2 (two) times daily. 2 tablets in the morning and 1 tablet in the evening. 12/05/13  Yes Sinda Du, MD  gabapentin (NEURONTIN) 400 MG capsule Take 400 mg by mouth 2 (two) times daily. Takes at noon & bedtime   Yes Historical Provider, MD  glimepiride (AMARYL) 2 MG tablet Take 2 mg by mouth daily before breakfast.     Yes Historical Provider, MD  HUMALOG KWIKPEN 100 UNIT/ML KiwkPen Inject 1-54 Units into the skin 3 (three) times daily before meals. Per sliding scale 02/06/13  Yes Historical Provider, MD    hydrALAZINE (APRESOLINE) 50 MG tablet Take 1 tablet (50 mg total) by mouth 2 (two) times daily. 12/05/13  Yes Sinda Du, MD  LINZESS 290 MCG CAPS capsule TAKE 1 CAPSULE ONCE DAILY 30 MINUTES BEFORE MEALS 05/08/15  Yes Mahala Menghini, PA-C  metoprolol (LOPRESSOR) 100 MG tablet Take 100 mg by mouth 2 (two) times daily.  03/14/12  Yes Historical Provider, MD  omeprazole (PRILOSEC) 20 MG capsule Take 20 mg by mouth daily.   Yes Historical Provider, MD  TOUJEO SOLOSTAR 300 UNIT/ML SOPN Inject 15 Units into the skin at bedtime. 06/14/15  Yes Historical Provider, MD  zolpidem Lorrin Mais)  10 MG tablet Take 10 mg by mouth at bedtime.    Yes Historical Provider, MD  HYDROcodone-acetaminophen (NORCO/VICODIN) 5-325 MG per tablet Take 1 tablet by mouth every 6 (six) hours as needed for moderate pain.     Historical Provider, MD  lanolin ointment Apply 1 application topically 2 (two) times daily as needed for dry skin.    Historical Provider, MD    Scheduled Meds: . amiodarone  400 mg Oral BID  . cefTRIAXone (ROCEPHIN)  IV  2 g Intravenous Q24H  . feeding supplement (GLUCERNA SHAKE)  237 mL Oral BID BM  . furosemide  40 mg Oral Daily  . hydrALAZINE  25 mg Oral Q8H  . insulin aspart  0-15 Units Subcutaneous TID WC  . insulin glargine  32 Units Subcutaneous Daily  . isosorbide mononitrate  30 mg Oral Daily  . metoprolol tartrate  50 mg Oral BID  . pantoprazole  40 mg Oral Daily  . potassium chloride  30 mEq Oral Q4H  . sodium chloride flush  3 mL Intravenous Q12H  . warfarin  5 mg Oral ONCE-1800  . Warfarin - Pharmacist Dosing Inpatient   Does not apply q1800   Infusions: . sodium chloride    . sodium chloride Stopped (08/21/15 1617)  . heparin 1,700 Units/hr (08/23/15 1941)   PRN Meds: acetaminophen (TYLENOL) oral liquid 160 mg/5 mL, hydrALAZINE, hydrocortisone cream, ipratropium-albuterol, loperamide, ondansetron (ZOFRAN) IV, sodium chloride flush, sodium chloride flush, sodium chloride  flush   Allergies as of 08/14/2015  . (No Known Allergies)    Family History  Problem Relation Age of Onset  . Diabetes Mother   . COPD Father   . Hypertension Mother   . Colon cancer Neg Hx     Social History   Social History  . Marital Status: Married    Spouse Name: N/A  . Number of Children: 2  . Years of Education: N/A   Occupational History  . retired    Social History Main Topics  . Smoking status: Former Smoker -- 1.00 packs/day for 4 years    Types: Cigarettes    Quit date: 08/26/2010  . Smokeless tobacco: Not on file  . Alcohol Use: No  . Drug Use: No  . Sexual Activity: Not on file   Other Topics Concern  . Not on file   Social History Narrative    REVIEW OF SYSTEMS: Constitutional:  Feels weak ENT:  No nose bleeds.  Wears full dentures Pulm:  Improved breathing CV:  No palpitations, no LE edema.  GU:  No hematuria, no frequency GI:  Per HPI Heme:  No issues at home with abnormal bleeding.    Transfusions:  None in past.  Neuro:  No headaches, no peripheral tingling or numbness Derm:  No itching, no rash or sores.  Endocrine:  No sweats or chills.  No polyuria or dysuria Immunization:  Not queried Travel:  None beyond local counties in last few months.    PHYSICAL EXAM: Vital signs in last 24 hours: Filed Vitals:   08/24/15 0406 08/24/15 0800  BP: 146/68 155/72  Pulse: 68 73  Temp: 98.2 F (36.8 C) 98.7 F (37.1 C)  Resp: 23 21   Wt Readings from Last 3 Encounters:  08/24/15 119.477 kg (263 lb 6.4 oz)  06/16/15 118.842 kg (262 lb)  06/22/14 120.203 kg (265 lb)   General: obese, looks tired and unwell.  comfortable Head:  No asymmetry  Eyes:  No icterus or pallor Ears:  Not HOH  Nose:  No congestion or discharge Mouth:  Dried blood at corners of mouth.   Neck:  No mass, no JVD Lungs:  Clear bil.  No cough or resp distress Heart: RRR, no MRG.  S1/s2 present Abdomen:  Obese, soft, BS quiet.  Tender in mid to lower belly bil.   No guard or rebound.   Rectal: deferred.  Specks of red blood seen on flexseal tubing but no stool or blood in the bag itself.    Musc/Skeltl: no joint redness or swelling Extremities:  Right >> left pitting pedal edema.  Non pitting edema in both hands  Neurologic:  Oriented x 3.  Moves all 4s.  No tremor.  Fully alert. Skin:  No rash or sorew  Psych:  Calm, cooperative.   Intake/Output from previous day: 06/21 0701 - 06/22 0700 In: 768 [P.O.:360; I.V.:408] Out: 2720 [Urine:2220; Stool:500] Intake/Output this shift: Total I/O In: 218 [P.O.:150; I.V.:68] Out: 100 [Stool:100]  LAB RESULTS:  Recent Labs  08/23/15 0250 08/24/15 0247  WBC 10.2 10.4  HGB 8.8* 8.8*  HCT 27.8* 27.6*  PLT 195 232   BMET Lab Results  Component Value Date   NA 135 08/24/2015   NA 138 08/23/2015   NA 138 08/22/2015   K 3.1* 08/24/2015   K 3.6 08/23/2015   K 4.7 08/22/2015   CL 102 08/24/2015   CL 102 08/23/2015   CL 104 08/22/2015   CO2 25 08/24/2015   CO2 27 08/23/2015   CO2 28 08/22/2015   GLUCOSE 161* 08/24/2015   GLUCOSE 248* 08/23/2015   GLUCOSE 263* 08/22/2015   BUN 61* 08/24/2015   BUN 73* 08/23/2015   BUN 74* 08/22/2015   CREATININE 2.73* 08/24/2015   CREATININE 3.02* 08/23/2015   CREATININE 3.13* 08/22/2015   CALCIUM 8.0* 08/24/2015   CALCIUM 8.1* 08/23/2015   CALCIUM 8.2* 08/22/2015   LFT No results for input(s): PROT, ALBUMIN, AST, ALT, ALKPHOS, BILITOT, BILIDIR, IBILI in the last 72 hours. PT/INR Lab Results  Component Value Date   INR 1.70* 08/24/2015   INR 1.36 08/23/2015   INR 1.12 08/14/2015   Hepatitis Panel No results for input(s): HEPBSAG, HCVAB, HEPAIGM, HEPBIGM in the last 72 hours. C-Diff No components found for: CDIFF Lipase     Component Value Date/Time   LIPASE 18 03/22/2009 1740    Drugs of Abuse  No results found for: LABOPIA, COCAINSCRNUR, LABBENZ, AMPHETMU, THCU, LABBARB   RADIOLOGY STUDIES: No results found.  ENDOSCOPIC STUDIES: Per  HPI  IMPRESSION:   *  FOBT +, grossly bloody stool,  anemia.   Hx adenomatous colon polyps in 2011 and 2015, poor prep and thus incomplete or suboptimal colonoscopies in 2014, 2015, 2016.   CT of last week showing rectosigmoid thickening and inflammation.  Given his recent sepsis, AKI, resp failure, hypotension, rapid a flutter strongly suspect ischemic colitis.   *  A flutter in setting of urosepsis.  In NSR after 6/20 cardioversion. Still on Heparin.     PLAN:     *  Flex sig tomorrow. Probably will do without sedation.  Leave flexiseal in place until tomorrow AM   Azucena Freed  08/24/2015, 11:28 AM Pager: [phone]

## 2015-08-24 NOTE — Progress Notes (Signed)
Nutrition Follow-up  DOCUMENTATION CODES:   Morbid obesity  INTERVENTION:   -Increase Glucerna Shake po to TID, each supplement provides 220 kcal and 10 grams of protein  NUTRITION DIAGNOSIS:   Inadequate oral intake related to  (decreased appetite) as evidenced by per patient/family report.  Ongoing  GOAL:   Patient will meet greater than or equal to 90% of their needs  Progressing  MONITOR:   PO intake, Supplement acceptance, I & O's, Labs  REASON FOR ASSESSMENT:   Ventilator    ASSESSMENT:   65 y/o M with admitted 6/12 to [facility] for atrial flutter, concern for UTI / sepsis. Decompensated 6/13 requiring vasopressors and intubation. 6/14 CT abd/pelvis >> distal proctocolitis  Pt transferred from ICU to SDU on 08/21/15.   Pt underwent BSE on 08/23/15; pt was upgraded to a dysphagia 2 diet with thin liquids.   Pt s/p TEE and cardioversion on 08/22/15.  Spoke with pt at bedside. He reports his appetite remains poor. Meal completion 10-50%. Pt denies any difficulty chewing or swallowing but notes constant pain in the mid-chest area, but unable to give further details to this RD. He estimates he consumed approximately 50% of his eggs and potatoes this morning.   Pt reports he likes the Glucerna supplements and is consuming approximately one per day. Discussed importance of consuming supplements to help meet nutritional goals. Pt expressed understanding and is agreeable to consume supplements when offered. Offered other supplements on formulary, but pt reports he would like to continue with Glucerna.   Rectal tube placed on 08/23/15; noted 600 ml output over the past 24 hours.   Case discussed with RN, who reports pt is eating very little. She reports pt will sometimes consume Glucerna supplements.   Labs reviewed: K: 3.1, CBGS: 110-182.   Diet Order:  Diet Heart Room service appropriate?: Yes; Fluid consistency:: Thin  Skin:  Reviewed, no issues  Last BM:   08/24/15  Height:   Ht Readings from Last 1 Encounters:  08/15/15 5\' 6"  (1.676 m)    Weight:   Wt Readings from Last 1 Encounters:  08/24/15 263 lb 6.4 oz (119.477 kg)    Ideal Body Weight:  64.5 kg  BMI:  Body mass index is 42.53 kg/(m^2).  Estimated Nutritional Needs:   Kcal:  2100-2300  Protein:  110-125 grams  Fluid:  2 L/day  EDUCATION NEEDS:   No education needs identified at this time  Mahari Strahm A. Jimmye Norman, RD, LDN, CDE Pager: [phone] After hours Pager: [phone]

## 2015-08-24 NOTE — Progress Notes (Signed)
Patient: Frank Hoover / Admit Date: 08/14/2015 / Date of Encounter: 08/24/2015, 7:44 AM   Subjective: No complaints this morning. "I'm doing pretty good. No dyspnea or chest pain.   Objective: Telemetry: SVT 170s Physical Exam: Blood pressure 146/68, pulse 68, temperature 98.2 F (36.8 C), temperature source Oral, resp. rate 23, height 5' 6"  (1.676 m), weight 263 lb 6.4 oz (119.477 kg), SpO2 98 %. General: Well developed obese AAM in no acute distress. Head: Normocephalic, atraumatic, sclera non-icteric, no xanthomas, nares are without discharge. Divergence of eyes noted. Neck: Negative for carotid bruits. JVP not elevated. Lungs: Clear bilaterally to auscultation without wheezes, rales, or rhonchi. Breathing is unlabored. Heart: Tachy, regular S1 S2 without murmurs, rubs, or gallops.  Abdomen: Soft, non-tender, non-distended with normoactive bowel sounds. No rebound/guarding. Extremities: No clubbing or cyanosis. Mild pedal edema. Distal pedal pulses are 2+ and equal bilaterally. Neuro: Alert and oriented X 3. Moves all extremities spontaneously. Psych:  Responds to questions appropriately with a flat affect.   Intake/Output Summary (Last 24 hours) at 08/24/15 0744 Last data filed at 08/24/15 0600  Gross per 24 hour  Intake    751 ml  Output   2720 ml  Net  -1969 ml    Inpatient Medications:  . amiodarone  400 mg Oral BID  . cloNIDine  0.1 mg Oral BID  . feeding supplement (GLUCERNA SHAKE)  237 mL Oral BID BM  . furosemide  40 mg Oral BID  . insulin aspart  0-15 Units Subcutaneous TID WC  . insulin glargine  32 Units Subcutaneous Daily  . magnesium sulfate 1 - 4 g bolus IVPB  1 g Intravenous Once  . metoprolol tartrate  50 mg Oral BID  . pantoprazole  40 mg Oral Daily  . potassium chloride  30 mEq Oral Q4H  . sodium chloride flush  3 mL Intravenous Q12H  . Warfarin - Pharmacist Dosing Inpatient   Does not apply q1800   Infusions:  . sodium chloride    . sodium  chloride Stopped (08/21/15 1617)  . heparin 1,700 Units/hr (08/23/15 1941)    Labs:  Recent Labs  08/23/15 0250 08/24/15 0247  NA 138 135  K 3.6 3.1*  CL 102 102  CO2 27 25  GLUCOSE 248* 161*  BUN 73* 61*  CREATININE 3.02* 2.73*  CALCIUM 8.1* 8.0*  MG  --  1.8   No results for input(s): AST, ALT, ALKPHOS, BILITOT, PROT, ALBUMIN in the last 72 hours.  Recent Labs  08/23/15 0250 08/24/15 0247  WBC 10.2 10.4  HGB 8.8* 8.8*  HCT 27.8* 27.6*  MCV 87.7 87.1  PLT 195 232   No results for input(s): CKTOTAL, CKMB, TROPONINI in the last 72 hours. Invalid input(s): POCBNP No results for input(s): HGBA1C in the last 72 hours.   Radiology/Studies:  Ct Abdomen Pelvis Wo Contrast  08/16/2015  CLINICAL DATA:  65 year old male with history of abdominal distention. EXAM: CT ABDOMEN AND PELVIS WITHOUT CONTRAST TECHNIQUE: Multidetector CT imaging of the abdomen and pelvis was performed following the standard protocol without IV contrast. COMPARISON:  No priors. FINDINGS: Lower chest: Cardiomegaly. Atherosclerotic calcifications in the right coronary artery. Trace bilateral pleural effusions. Hepatobiliary: No definite cystic or solid hepatic lesions are identified within the liver on today's noncontrast CT examination. Unenhanced appearance of the gallbladder is normal. Pancreas: Atrophy in the pancreas. No definite pancreatic mass. Stranding throughout the retroperitoneum is noted, including adjacent to the pancreas. No well-defined peripancreatic fluid collections are identified. Spleen:  Unremarkable. Adrenals/Urinary Tract: Bilateral perinephric stranding (nonspecific). Unenhanced appearance of the kidneys and bilateral adrenal glands is otherwise unremarkable. No hydroureteronephrosis to indicate urinary tract obstruction at this time. Foley balloon catheter inside the urinary bladder which is nearly completely decompressed. Stomach/Bowel: Unenhanced appearance of the stomach is normal. Tip of  nasogastric tube in the body of the stomach. There is extensive colonic wall thickening in the sigmoid colon and rectal wall thickening, suggestive of a distal proctocolitis. Haziness in the mesorectum and distal sigmoid mesocolon this presumably secondary to inflammation. No pathologic dilatation of small bowel or colon. Normal appendix. Vascular/Lymphatic: Atherosclerotic calcifications throughout the abdominal and pelvic vasculature, without definite aneurysm. No lymphadenopathy noted in the abdomen or pelvis. Reproductive: Prostate gland and seminal vesicles are unremarkable in appearance. Other: Small umbilical hernia containing only omental fat. Trace volume of ascites. No pneumoperitoneum. Mild diffuse body wall edema. Musculoskeletal: There are no aggressive appearing lytic or blastic lesions noted in the visualized portions of the skeleton. IMPRESSION: 1. Thickening of the sigmoid colon and rectal wall with surrounding inflammatory changes in the adjacent mesocolon/mesorectum, concerning for distal proctocolitis. 2. Trace bilateral pleural effusions, trace volume of ascites, retroperitoneal edema and diffuse mild body wall edema suggestive of a state of mild anasarca. 3. Atherosclerosis, including right coronary artery disease. Please note that although the presence of coronary artery calcium documents the presence of coronary artery disease, the severity of this disease and any potential stenosis cannot be assessed on this non-gated CT examination. Assessment for potential risk factor modification, dietary therapy or pharmacologic therapy may be warranted, if clinically indicated. 4. Cardiomegaly. Electronically Signed   By: Vinnie Langton M.D.   On: 08/16/2015 13:45   Dg Chest 2 View  08/14/2015  CLINICAL DATA:  65 year old male with shortness of breath. EXAM: CHEST  2 VIEW COMPARISON:  Chest radiograph dated 12/02/2013 FINDINGS: The heart size and mediastinal contours are within normal limits. Both  lungs are clear. The visualized skeletal structures are unremarkable. IMPRESSION: No active cardiopulmonary disease. Electronically Signed   By: Anner Crete M.D.   On: 08/14/2015 15:52   US Renal  08/15/2015  CLINICAL DATA:  Acute kidney injury. EXAM: RENAL / URINARY TRACT ULTRASOUND COMPLETE COMPARISON:  Ultrasound of June 18, 2004. FINDINGS: Right Kidney: Length: 8.5 cm. Increased echogenicity of renal parenchyma is noted. No mass or hydronephrosis visualized. Left Kidney: Length: 10 cm. Echogenicity within normal limits. No mass or hydronephrosis visualized. Bladder: Appears normal for degree of bladder distention. IMPRESSION: Mild right renal atrophy is noted with increased echogenicity of parenchyma of right kidney suggesting medical renal disease. No hydronephrosis or renal obstruction is noted. Left kidney appears normal. Electronically Signed   By: Marijo Conception, M.D.   On: 08/15/2015 12:46   Dg Chest Port 1 View  08/20/2015  CLINICAL DATA:  Patient with history of ETT. EXAM: PORTABLE CHEST 1 VIEW COMPARISON:  Chest radiograph 08/19/2015. FINDINGS: Right IJ central venous catheter tip projects over the superior vena cava. ET tube terminates in the distal trachea. Enteric tube tip and side-port project over the stomach. Low lung volumes. Stable enlarged cardiac and mediastinal contours. Interval increase left mid lower lung heterogeneous opacities with small layering left pleural effusion. Unchanged bandlike opacity right mid lung. IMPRESSION: Stable support apparatus. Interval increase heterogeneous opacities left mid lower lung most compatible with atelectasis and small left pleural effusion. Probable right mid lung subsegmental atelectasis. Electronically Signed   By: Lovey Newcomer M.D.   On: 08/20/2015 08:22  Dg Chest Port 1 View  08/19/2015  CLINICAL DATA:  Respiratory failure. EXAM: PORTABLE CHEST 1 VIEW COMPARISON:  Radiograph of August 18, 2015. FINDINGS: Stable cardiomediastinal  silhouette. Endotracheal and nasogastric tubes are unchanged in position. Right internal jugular catheter is also unchanged with tip in expected position of SVC. No pneumothorax or pleural effusion is noted. Left lung is clear. Mild right midlung subsegmental atelectasis is noted. Bony thorax is unremarkable. IMPRESSION: Stable support apparatus. Mild right midlung subsegmental atelectasis is noted. Electronically Signed   By: Marijo Conception, M.D.   On: 08/19/2015 07:35   Dg Chest Port 1 View  08/18/2015  CLINICAL DATA:  Respiratory failure EXAM: PORTABLE CHEST 1 VIEW COMPARISON:  08/17/2015 FINDINGS: Cardiac shadow is stable. An endotracheal tube and nasogastric catheter are noted in stable position. A right jugular central line is again seen. Bibasilar atelectatic changes are noted worse on the right than the left. These have increased in the interval from the prior exam. IMPRESSION: Increasing bibasilar atelectatic changes. Tubes and lines as described. Electronically Signed   By: Inez Catalina M.D.   On: 08/18/2015 06:43   Dg Chest Port 1 View  08/17/2015  CLINICAL DATA:  Respiratory failure. EXAM: PORTABLE CHEST 1 VIEW COMPARISON:  08/15/2015. FINDINGS: Endotracheal tube 3 cm above the carina on today's exam. NG tube, right IJ line stable position. Heart size normal. Right mid lung and bibasilar subsegmental atelectasis. No pleural effusion or pneumothorax. IMPRESSION: 1. Endotracheal tube 3 cm above the carina on today's exam. NG tube and right IJ line stable position. 2. Right mid lung and bibasilar subsegmental atelectasis. Electronically Signed   By: Marcello Moores  Register   On: 08/17/2015 06:56   Dg Chest Port 1 View  08/15/2015  CLINICAL DATA:  Endotracheal tube placement and central line placement. Nasogastric tube placement. Initial encounter. EXAM: PORTABLE CHEST 1 VIEW COMPARISON:  Chest radiograph performed 08/14/2015 FINDINGS: The patient's endotracheal tube is seen ending 1-2 cm above the  carina. This could be retracted 1-2 cm, as deemed clinically appropriate. An right IJ line is noted ending about the mid SVC. An enteric tube is noted ending overlying the body of the stomach. The lungs are hypoexpanded. Right perihilar airspace opacity could reflect pneumonia. No pleural effusion or pneumothorax is seen. The cardiomediastinal silhouette is borderline normal in size. No acute osseous abnormalities are identified. External pacing pads are noted. IMPRESSION: 1. Endotracheal tube seen ending 1-2 cm above the carina. This could be retracted 1-2 cm, as deemed clinically appropriate. 2. Right IJ line noted ending about the mid SVC. 3. Enteric tube seen extending overlying the body of the stomach. 4. Lungs hypoexpanded. Right perihilar airspace opacity could reflect pneumonia. Electronically Signed   By: Garald Balding M.D.   On: 08/15/2015 18:32   Dg Chest Port 1 View  08/14/2015  CLINICAL DATA:  Shortness of breath. Altered mental status. Bilateral leg edema. EXAM: PORTABLE CHEST 1 VIEW COMPARISON:  08/14/2015 FINDINGS: Examination is technically limited due to motion artifact. Shallow inspiration. Mild cardiac enlargement without vascular congestion. No focal airspace disease or consolidation in the lungs. No blunting of costophrenic angles. No pneumothorax. IMPRESSION: Shallow inspiration.  No evidence of active pulmonary disease. Electronically Signed   By: Lucienne Capers M.D.   On: 08/14/2015 23:42   Dg Abd Portable 1v  08/16/2015  CLINICAL DATA:  Malfunction of nasogastric tube. EXAM: PORTABLE ABDOMEN - 1 VIEW COMPARISON:  08/15/2015. FINDINGS: NG tube coiled in the stomach. Catheter noted in the pelvis.  Prominently distended loops of what appear to be colon noted. Colonic ileus and/or colonic obstruction could present this fashion. Eighty sigmoid volvulus cannot be completely excluded. No free air. Follow-up exam suggested to demonstrate resolution. IMPRESSION: 1. NG tube noted coiled in  the stomach. 2. Prominent dilated loops of bowel which appear to be colon noted. Colonic obstruction cannot be excluded. Sigmoid volvulus cannot be excluded. Follow-up abdominal series suggested to demonstrate resolution. Electronically Signed   By: Marcello Moores  Register   On: 08/16/2015 06:59     Assessment and Plan  24M with history of CKD stage III, COPD, HTN, gout, DM admitted after presenting for an echocardiogram ordered by his nephrologist secondary to elevated heart rate and leg edema. He then was referred to the ER after call placed to Dr. Lowanda Foster reporting patient heart rate in the 120s and associated dizziness. He was found to be in newly recognized atrial flutter RVR of unknown duration, acute cardiogenic/septic shock requiring pressors & IV abx (citrobacter bacteremia), acute respiratory failure requiring ventilation, EF 25%. Ischemic workup not yet pursued due to AKI on CKD with Cr peak over 5. Elevated flat troponin 6/12-6/14. Atrial flutter RVR persisted despite amiodarone loading thus underwent TEE/DCCV 08/22/15 with successful conversion to NSR. On (08/23/15) was noted to go into rapid SVT HR 170-180s with associated chest discomfort and fall in BP. Responded once to carotid massage and a second time to IV adenosine.   1. Acute cardiogenic/septic shock and acute respiratory failure - off pressors, off vent, BP stable   2. Acute systolic CHF - continues to diurese well on po lasix. I/o negative 1.9 liters yesterday. Weight down 8 lbs since admit. Not on ACEI/ARB/spiro due to ARF. Appears to be close to euvolemia. Need to replete potassium. Will reduce lasix to 40 mg daily. Continue metoprolol. Add oral hydralazine and nitrates. Repeat Echo in 2 months to reassess EF.   3. Atrial flutter RVR - s/p TEE/DCCV 08/22/15. On heparin until therapeutic on coumadin. On oral amiodarone and beta blocker. Long term coumadin may be problematic since patient has a history of refusing to go to physician  appointments. At this point not a good candidate for DOAC due to ARF. If renal function continues to improve may consider switching to a DOAC at a later time.   4.  SVT 08/23/15 - resolved. No recurrence since 11 am yesterday. Will continue beta blocker and amiodarone. Can titrate beta blocker more if need be.   5. AKI on CKD stage III - Cr continues to gradually improve. Avoid contrast studies.  6. Progressive anemia - ? Due to critical illness/CKD. Stool heme positive. Further per IM.   7. Hypokalemia- repleted  8. HTN now controlled. Will DC clonidine and add hydralazine/nitrates for BP.  Signed,  Kaylianna Detert [place], [place] 08/24/2015 7:44 AM

## 2015-08-24 NOTE — Care Management Important Message (Signed)
Important Message  Patient Details  Name: Frank Hoover MRN: [id number] Date of Birth: 09-24-1950   Medicare Important Message Given:  Yes    Lacretia Leigh, RN 08/24/2015, 10:59 AM

## 2015-08-24 NOTE — Progress Notes (Signed)
Noted with blood tinged urine and some blood in the flexiseal. MD aware with order to d/c heparin.

## 2015-08-25 ENCOUNTER — Encounter (HOSPITAL_COMMUNITY): Payer: Self-pay | Admitting: *Deleted

## 2015-08-25 ENCOUNTER — Encounter (HOSPITAL_COMMUNITY)
Admission: EM | Disposition: A | Discharge status: Skilled Nursing Facility | Payer: Self-pay | Source: Home / Self Care | Attending: Internal Medicine

## 2015-08-25 HISTORY — PX: FLEXIBLE SIGMOIDOSCOPY: SHX5431

## 2015-08-25 LAB — BASIC METABOLIC PANEL
Anion gap: 5 (ref 5–15)
BUN: 47 mg/dL — ABNORMAL HIGH (ref 6–20)
CO2: 25 mmol/L (ref 22–32)
Calcium: 8.1 mg/dL — ABNORMAL LOW (ref 8.9–10.3)
Chloride: 106 mmol/L (ref 101–111)
Creatinine, Ser: 2.49 mg/dL — ABNORMAL HIGH (ref 0.61–1.24)
GFR calc Af Amer: 30 mL/min — ABNORMAL LOW (ref >60)
GFR calc non Af Amer: 26 mL/min — ABNORMAL LOW (ref >60)
Glucose, Bld: 170 mg/dL — ABNORMAL HIGH (ref 65–99)
Potassium: 3.8 mmol/L (ref 3.5–5.1)
Sodium: 136 mmol/L (ref 135–145)

## 2015-08-25 LAB — GLUCOSE, CAPILLARY
Glucose-Capillary: 218 mg/dL — ABNORMAL HIGH (ref 65–99)
Glucose-Capillary: 199 mg/dL — ABNORMAL HIGH (ref 65–99)
Glucose-Capillary: 178 mg/dL — ABNORMAL HIGH (ref 65–99)
Glucose-Capillary: 180 mg/dL — ABNORMAL HIGH (ref 65–99)

## 2015-08-25 LAB — HEPATIC FUNCTION PANEL
ALT: 41 U/L (ref 17–63)
AST: 38 U/L (ref 15–41)
Albumin: 1.8 g/dL — ABNORMAL LOW (ref 3.5–5.0)
Alkaline Phosphatase: 57 U/L (ref 38–126)
Bilirubin, Direct: 0.1 mg/dL (ref 0.1–0.5)
Indirect Bilirubin: 0 mg/dL — ABNORMAL LOW (ref 0.3–0.9)
Total Bilirubin: 0.1 mg/dL — ABNORMAL LOW (ref 0.3–1.2)
Total Protein: 5.4 g/dL — ABNORMAL LOW (ref 6.5–8.1)

## 2015-08-25 LAB — CBC
HCT: 26.2 % — ABNORMAL LOW (ref 39.0–52.0)
HCT: 26.4 % — ABNORMAL LOW (ref 39.0–52.0)
Hemoglobin: 8.4 g/dL — ABNORMAL LOW (ref 13.0–17.0)
Hemoglobin: 8.4 g/dL — ABNORMAL LOW (ref 13.0–17.0)
MCH: 27.6 pg (ref 26.0–34.0)
MCH: 27.9 pg (ref 26.0–34.0)
MCHC: 31.8 g/dL (ref 30.0–36.0)
MCHC: 32.1 g/dL (ref 30.0–36.0)
MCV: 86.8 fL (ref 78.0–100.0)
MCV: 87 fL (ref 78.0–100.0)
Platelets: 231 10*3/uL (ref 150–400)
Platelets: 239 10*3/uL (ref 150–400)
RBC: 3.01 MIL/uL — ABNORMAL LOW (ref 4.22–5.81)
RBC: 3.04 MIL/uL — ABNORMAL LOW (ref 4.22–5.81)
RDW: 15.4 % (ref 11.5–15.5)
RDW: 15.4 % (ref 11.5–15.5)
WBC: 8.1 10*3/uL (ref 4.0–10.5)
WBC: 8.4 10*3/uL (ref 4.0–10.5)

## 2015-08-25 LAB — PROTIME-INR
INR: 3.69 — ABNORMAL HIGH (ref 0.00–1.49)
Prothrombin Time: 35.8 seconds — ABNORMAL HIGH (ref 11.6–15.2)

## 2015-08-25 LAB — HEPARIN LEVEL (UNFRACTIONATED): Heparin Unfractionated: <0.1 IU/mL — ABNORMAL LOW (ref 0.30–0.70)

## 2015-08-25 SURGERY — SIGMOIDOSCOPY, FLEXIBLE
Anesthesia: Moderate Sedation

## 2015-08-25 MED ORDER — FENTANYL CITRATE (PF) 100 MCG/2ML IJ SOLN
INTRAMUSCULAR | Status: AC
  Filled 2015-08-25: qty 2

## 2015-08-25 MED ORDER — MORPHINE SULFATE (PF) 2 MG/ML IV SOLN
1.0000 mg | Freq: Once | INTRAVENOUS | Status: AC
  Administered 2015-08-25: 1 mg via INTRAVENOUS
  Filled 2015-08-25: qty 1

## 2015-08-25 MED ORDER — MIDAZOLAM HCL 10 MG/2ML IJ SOLN
INTRAMUSCULAR | Status: DC | PRN
  Administered 2015-08-25 (×2): 1 mg via INTRAVENOUS

## 2015-08-25 MED ORDER — HYDRALAZINE HCL 50 MG PO TABS
50.0000 mg | ORAL_TABLET | Freq: Three times a day (TID) | ORAL | Status: DC
  Administered 2015-08-25 – 2015-08-26 (×3): 50 mg via ORAL
  Filled 2015-08-25 (×3): qty 1

## 2015-08-25 MED ORDER — ISOSORBIDE MONONITRATE ER 60 MG PO TB24
60.0000 mg | ORAL_TABLET | Freq: Every day | ORAL | Status: DC
  Administered 2015-08-26 – 2015-09-08 (×14): 60 mg via ORAL
  Filled 2015-08-25 (×14): qty 1

## 2015-08-25 MED ORDER — SODIUM CHLORIDE 0.9 % IV SOLN
INTRAVENOUS | Status: DC
Start: 2015-08-25 — End: 2015-08-25
  Administered 2015-08-25: 500 mL via INTRAVENOUS

## 2015-08-25 MED ORDER — FENTANYL CITRATE (PF) 100 MCG/2ML IJ SOLN
INTRAMUSCULAR | Status: DC | PRN
  Administered 2015-08-25: 25 ug via INTRAVENOUS

## 2015-08-25 MED ORDER — MIDAZOLAM HCL 5 MG/ML IJ SOLN
INTRAMUSCULAR | Status: AC
  Filled 2015-08-25: qty 2

## 2015-08-25 NOTE — Progress Notes (Signed)
Patient up in chair and his legs are very weak ,please see PT note / in to assist him while up

## 2015-08-25 NOTE — Progress Notes (Signed)
Tap Water enema given

## 2015-08-25 NOTE — Progress Notes (Signed)
Patient: Frank Hoover / Admit Date: 08/14/2015 / Date of Encounter: 08/25/2015, 7:22 AM   Subjective: Complains of "hurting all over". No chest pain or SOB.    Objective: Telemetry: NSR 70s Physical Exam: Blood pressure 160/70, pulse 82, temperature 98.4 F (36.9 C), temperature source Oral, resp. rate 18, height 5' 6"  (1.676 m), weight 264 lb 9.6 oz (120.022 kg), SpO2 99 %. General: Well developed obese AAM in no acute distress. Head: Normocephalic, atraumatic, sclera non-icteric, no xanthomas, nares are without discharge. Divergence of eyes noted. Neck: Negative for carotid bruits. JVP not elevated. Lungs: Clear bilaterally to auscultation without wheezes, rales, or rhonchi. Breathing is unlabored. Heart: RRR S1 S2 without murmurs, rubs, or gallops.  Abdomen: Soft, non-tender, non-distended with normoactive bowel sounds. No rebound/guarding. Extremities: No clubbing or cyanosis. trace pedal edema. Distal pedal pulses are 2+ and equal bilaterally. Neuro: Alert and oriented X 3. Moves all extremities spontaneously. Psych:  Responds to questions appropriately with a flat affect.   Intake/Output Summary (Last 24 hours) at 08/25/15 9924 Last data filed at 08/25/15 0600  Gross per 24 hour  Intake   1100 ml  Output   3125 ml  Net  -2025 ml    Inpatient Medications:  . amiodarone  400 mg Oral BID  . cefTRIAXone (ROCEPHIN)  IV  2 g Intravenous Q24H  . feeding supplement (GLUCERNA SHAKE)  237 mL Oral TID BM  . furosemide  40 mg Oral Daily  . hydrALAZINE  25 mg Oral Q8H  . insulin aspart  0-15 Units Subcutaneous TID WC  . insulin glargine  32 Units Subcutaneous Daily  . isosorbide mononitrate  30 mg Oral Daily  . metoprolol tartrate  50 mg Oral BID  . pantoprazole  40 mg Oral Daily  . sodium chloride flush  3 mL Intravenous Q12H  . Warfarin - Pharmacist Dosing Inpatient   Does not apply q1800   Infusions:  . sodium chloride    . sodium chloride Stopped (08/21/15 1617)     Labs:  Recent Labs  08/24/15 0247 08/25/15 0423  NA 135 136  K 3.1* 3.8  CL 102 106  CO2 25 25  GLUCOSE 161* 170*  BUN 61* 47*  CREATININE 2.73* 2.49*  CALCIUM 8.0* 8.1*  MG 1.8  --     Recent Labs  08/25/15 0423  AST 38  ALT 41  ALKPHOS 57  BILITOT 0.1*  PROT 5.4*  ALBUMIN 1.8*    Recent Labs  08/25/15 0020 08/25/15 0423  WBC 8.4 8.1  HGB 8.4* 8.4*  HCT 26.2* 26.4*  MCV 87.0 86.8  PLT 231 239   No results for input(s): CKTOTAL, CKMB, TROPONINI in the last 72 hours. Invalid input(s): POCBNP No results for input(s): HGBA1C in the last 72 hours.   Radiology/Studies:  Ct Abdomen Pelvis Wo Contrast  08/16/2015  CLINICAL DATA:  65 year old male with history of abdominal distention. EXAM: CT ABDOMEN AND PELVIS WITHOUT CONTRAST TECHNIQUE: Multidetector CT imaging of the abdomen and pelvis was performed following the standard protocol without IV contrast. COMPARISON:  No priors. FINDINGS: Lower chest: Cardiomegaly. Atherosclerotic calcifications in the right coronary artery. Trace bilateral pleural effusions. Hepatobiliary: No definite cystic or solid hepatic lesions are identified within the liver on today's noncontrast CT examination. Unenhanced appearance of the gallbladder is normal. Pancreas: Atrophy in the pancreas. No definite pancreatic mass. Stranding throughout the retroperitoneum is noted, including adjacent to the pancreas. No well-defined peripancreatic fluid collections are identified. Spleen: Unremarkable. Adrenals/Urinary Tract: Bilateral  perinephric stranding (nonspecific). Unenhanced appearance of the kidneys and bilateral adrenal glands is otherwise unremarkable. No hydroureteronephrosis to indicate urinary tract obstruction at this time. Foley balloon catheter inside the urinary bladder which is nearly completely decompressed. Stomach/Bowel: Unenhanced appearance of the stomach is normal. Tip of nasogastric tube in the body of the stomach. There is  extensive colonic wall thickening in the sigmoid colon and rectal wall thickening, suggestive of a distal proctocolitis. Haziness in the mesorectum and distal sigmoid mesocolon this presumably secondary to inflammation. No pathologic dilatation of small bowel or colon. Normal appendix. Vascular/Lymphatic: Atherosclerotic calcifications throughout the abdominal and pelvic vasculature, without definite aneurysm. No lymphadenopathy noted in the abdomen or pelvis. Reproductive: Prostate gland and seminal vesicles are unremarkable in appearance. Other: Small umbilical hernia containing only omental fat. Trace volume of ascites. No pneumoperitoneum. Mild diffuse body wall edema. Musculoskeletal: There are no aggressive appearing lytic or blastic lesions noted in the visualized portions of the skeleton. IMPRESSION: 1. Thickening of the sigmoid colon and rectal wall with surrounding inflammatory changes in the adjacent mesocolon/mesorectum, concerning for distal proctocolitis. 2. Trace bilateral pleural effusions, trace volume of ascites, retroperitoneal edema and diffuse mild body wall edema suggestive of a state of mild anasarca. 3. Atherosclerosis, including right coronary artery disease. Please note that although the presence of coronary artery calcium documents the presence of coronary artery disease, the severity of this disease and any potential stenosis cannot be assessed on this non-gated CT examination. Assessment for potential risk factor modification, dietary therapy or pharmacologic therapy may be warranted, if clinically indicated. 4. Cardiomegaly. Electronically Signed   By: Vinnie Langton M.D.   On: 08/16/2015 13:45   Dg Chest 2 View  08/14/2015  CLINICAL DATA:  65 year old male with shortness of breath. EXAM: CHEST  2 VIEW COMPARISON:  Chest radiograph dated 12/02/2013 FINDINGS: The heart size and mediastinal contours are within normal limits. Both lungs are clear. The visualized skeletal structures  are unremarkable. IMPRESSION: No active cardiopulmonary disease. Electronically Signed   By: Anner Crete M.D.   On: 08/14/2015 15:52   US Renal  08/15/2015  CLINICAL DATA:  Acute kidney injury. EXAM: RENAL / URINARY TRACT ULTRASOUND COMPLETE COMPARISON:  Ultrasound of June 18, 2004. FINDINGS: Right Kidney: Length: 8.5 cm. Increased echogenicity of renal parenchyma is noted. No mass or hydronephrosis visualized. Left Kidney: Length: 10 cm. Echogenicity within normal limits. No mass or hydronephrosis visualized. Bladder: Appears normal for degree of bladder distention. IMPRESSION: Mild right renal atrophy is noted with increased echogenicity of parenchyma of right kidney suggesting medical renal disease. No hydronephrosis or renal obstruction is noted. Left kidney appears normal. Electronically Signed   By: Marijo Conception, M.D.   On: 08/15/2015 12:46   Dg Chest Port 1 View  08/20/2015  CLINICAL DATA:  Patient with history of ETT. EXAM: PORTABLE CHEST 1 VIEW COMPARISON:  Chest radiograph 08/19/2015. FINDINGS: Right IJ central venous catheter tip projects over the superior vena cava. ET tube terminates in the distal trachea. Enteric tube tip and side-port project over the stomach. Low lung volumes. Stable enlarged cardiac and mediastinal contours. Interval increase left mid lower lung heterogeneous opacities with small layering left pleural effusion. Unchanged bandlike opacity right mid lung. IMPRESSION: Stable support apparatus. Interval increase heterogeneous opacities left mid lower lung most compatible with atelectasis and small left pleural effusion. Probable right mid lung subsegmental atelectasis. Electronically Signed   By: Lovey Newcomer M.D.   On: 08/20/2015 08:22   Dg Chest Port 1  View  08/19/2015  CLINICAL DATA:  Respiratory failure. EXAM: PORTABLE CHEST 1 VIEW COMPARISON:  Radiograph of August 18, 2015. FINDINGS: Stable cardiomediastinal silhouette. Endotracheal and nasogastric tubes are  unchanged in position. Right internal jugular catheter is also unchanged with tip in expected position of SVC. No pneumothorax or pleural effusion is noted. Left lung is clear. Mild right midlung subsegmental atelectasis is noted. Bony thorax is unremarkable. IMPRESSION: Stable support apparatus. Mild right midlung subsegmental atelectasis is noted. Electronically Signed   By: Marijo Conception, M.D.   On: 08/19/2015 07:35   Dg Chest Port 1 View  08/18/2015  CLINICAL DATA:  Respiratory failure EXAM: PORTABLE CHEST 1 VIEW COMPARISON:  08/17/2015 FINDINGS: Cardiac shadow is stable. An endotracheal tube and nasogastric catheter are noted in stable position. A right jugular central line is again seen. Bibasilar atelectatic changes are noted worse on the right than the left. These have increased in the interval from the prior exam. IMPRESSION: Increasing bibasilar atelectatic changes. Tubes and lines as described. Electronically Signed   By: Inez Catalina M.D.   On: 08/18/2015 06:43   Dg Chest Port 1 View  08/17/2015  CLINICAL DATA:  Respiratory failure. EXAM: PORTABLE CHEST 1 VIEW COMPARISON:  08/15/2015. FINDINGS: Endotracheal tube 3 cm above the carina on today's exam. NG tube, right IJ line stable position. Heart size normal. Right mid lung and bibasilar subsegmental atelectasis. No pleural effusion or pneumothorax. IMPRESSION: 1. Endotracheal tube 3 cm above the carina on today's exam. NG tube and right IJ line stable position. 2. Right mid lung and bibasilar subsegmental atelectasis. Electronically Signed   By: Marcello Moores  Register   On: 08/17/2015 06:56   Dg Chest Port 1 View  08/15/2015  CLINICAL DATA:  Endotracheal tube placement and central line placement. Nasogastric tube placement. Initial encounter. EXAM: PORTABLE CHEST 1 VIEW COMPARISON:  Chest radiograph performed 08/14/2015 FINDINGS: The patient's endotracheal tube is seen ending 1-2 cm above the carina. This could be retracted 1-2 cm, as deemed  clinically appropriate. An right IJ line is noted ending about the mid SVC. An enteric tube is noted ending overlying the body of the stomach. The lungs are hypoexpanded. Right perihilar airspace opacity could reflect pneumonia. No pleural effusion or pneumothorax is seen. The cardiomediastinal silhouette is borderline normal in size. No acute osseous abnormalities are identified. External pacing pads are noted. IMPRESSION: 1. Endotracheal tube seen ending 1-2 cm above the carina. This could be retracted 1-2 cm, as deemed clinically appropriate. 2. Right IJ line noted ending about the mid SVC. 3. Enteric tube seen extending overlying the body of the stomach. 4. Lungs hypoexpanded. Right perihilar airspace opacity could reflect pneumonia. Electronically Signed   By: Garald Balding M.D.   On: 08/15/2015 18:32   Dg Chest Port 1 View  08/14/2015  CLINICAL DATA:  Shortness of breath. Altered mental status. Bilateral leg edema. EXAM: PORTABLE CHEST 1 VIEW COMPARISON:  08/14/2015 FINDINGS: Examination is technically limited due to motion artifact. Shallow inspiration. Mild cardiac enlargement without vascular congestion. No focal airspace disease or consolidation in the lungs. No blunting of costophrenic angles. No pneumothorax. IMPRESSION: Shallow inspiration.  No evidence of active pulmonary disease. Electronically Signed   By: Lucienne Capers M.D.   On: 08/14/2015 23:42   Dg Abd Portable 1v  08/16/2015  CLINICAL DATA:  Malfunction of nasogastric tube. EXAM: PORTABLE ABDOMEN - 1 VIEW COMPARISON:  08/15/2015. FINDINGS: NG tube coiled in the stomach. Catheter noted in the pelvis. Prominently distended loops of  what appear to be colon noted. Colonic ileus and/or colonic obstruction could present this fashion. Eighty sigmoid volvulus cannot be completely excluded. No free air. Follow-up exam suggested to demonstrate resolution. IMPRESSION: 1. NG tube noted coiled in the stomach. 2. Prominent dilated loops of bowel  which appear to be colon noted. Colonic obstruction cannot be excluded. Sigmoid volvulus cannot be excluded. Follow-up abdominal series suggested to demonstrate resolution. Electronically Signed   By: Marcello Moores  Register   On: 08/16/2015 06:59     Assessment and Plan  21M with history of CKD stage III, COPD, HTN, gout, DM admitted after presenting for an echocardiogram ordered by his nephrologist secondary to elevated heart rate and leg edema. He then was referred to the ER after call placed to Dr. Lowanda Foster reporting patient heart rate in the 120s and associated dizziness. He was found to be in newly recognized atrial flutter RVR of unknown duration, acute cardiogenic/septic shock requiring pressors & IV abx (citrobacter bacteremia), acute respiratory failure requiring ventilation, EF 25%. Ischemic workup not yet pursued due to AKI on CKD with Cr peak over 5. Elevated flat troponin 6/12-6/14. Atrial flutter RVR persisted despite amiodarone loading thus underwent TEE/DCCV 08/22/15 with successful conversion to NSR. On (08/23/15) was noted to go into rapid SVT HR 170-180s with associated chest discomfort and fall in BP. Responded once to carotid massage and a second time to IV adenosine.   1. Acute cardiogenic/septic shock and acute respiratory failure - off pressors, off vent, BP stable   2. Acute systolic CHF - continues to diurese well on po lasix. Dose reduced to 40 mg daily yesterday. I/O negative 2 liters yesterday. Weight down 8 lbs since admit. Not on ACEI/ARB/spiro due to ARF. Appears to be  euvolemia.  Continue metoprolol. Titrate oral hydralazine and nitrates since BP elevated. Repeat Echo in 2 months to reassess EF.   3. Atrial flutter RVR - s/p TEE/DCCV 08/22/15. INR now supratherapeutic on coumadin. IV heparin discontinued.  On oral amiodarone and beta blocker. Long term coumadin may be problematic since patient has a history of refusing to go to physician appointments. At this point not a good  candidate for DOAC due to ARF. If renal function continues to improve may consider switching to a DOAC at a later time. Pharmacy dosing coumadin. Will need to hold today.  4.  SVT 08/23/15 - resolved. No recurrence. Will continue beta blocker and amiodarone. Can titrate beta blocker more if need be.   5. AKI on CKD stage III - Cr continues to gradually improve. Avoid contrast studies.  6. Progressive anemia - ? Due to critical illness/CKD. Stool heme positive. ? Ischemic colitis secondary to shock. Possible flex sig per GI.   7. Hypokalemia- repleted  8. HTN will titrate hydralazine/nitrates for BP.  Signed,  Elija Mccamish [place], [place] 08/25/2015 7:22 AM

## 2015-08-25 NOTE — Op Note (Signed)
[facility] Patient Name: Frank Hoover Procedure Date : 08/25/2015 MRN: [id number] Attending MD: Jerene Bears , MD Date of Birth: 24-Apr-1950 CSN: [id number] Age: 65 Admit Type: Inpatient Procedure:                Flexible Sigmoidoscopy Indications:              Rectal hemorrhage, Abnormal CT of the GI tract,                            Acute post hemorrhagic anemia Providers:                Lajuan Lines. Hilarie Fredrickson, MD, Cleda Daub, RN, Elspeth Cho, Technician, Lance Coon, CRNA Referring MD:             [facility] Medicines:                Fentanyl 25 micrograms IV, Midazolam 2 mg IV Complications:            No immediate complications. Estimated Blood Loss:     Estimated blood loss was minimal. Procedure:                Pre-Anesthesia Assessment:                           - Prior to the procedure, a History and Physical                            was performed, and patient medications and                            allergies were reviewed. The patient's tolerance of                            previous anesthesia was also reviewed. The risks                            and benefits of the procedure and the sedation                            options and risks were discussed with the patient.                            All questions were answered, and informed consent                            was obtained. Prior Anticoagulants: The patient has                            taken Coumadin (warfarin), last dose was 1 day                            prior to procedure. ASA Grade Assessment: III - A  patient with severe systemic disease. After                            reviewing the risks and benefits, the patient was                            deemed in satisfactory condition to undergo the                            procedure.                           After obtaining informed consent, the scope was                   passed under direct vision. The [id number]                            scope was introduced through the anus and advanced                            to the the sigmoid colon. The flexible                            sigmoidoscopy was accomplished without difficulty.                            The patient tolerated the procedure well. The                            quality of the bowel preparation was adequate. Scope In: Scope Out: Findings:      The digital rectal exam was normal.      A segmental area of severely congested, erythematous, friable (with       contact bleeding), hemorrhagic, inflamed, plaque covered and ulcerated       mucosa was found in the recto-sigmoid colon, in the sigmoid colon, in       the mid sigmoid colon and in the distal sigmoid colon. Multiple biopsies       were obtained with cold forceps for histology in a targeted manner.      The proximal sigmoid colon appeared normal.      The distal rectum appeared normal.      The retroflexed view of the distal rectum and anal verge was normal and       showed no anal or rectal abnormalities. Impression:               - Congested, erythematous, friable (with contact                            bleeding), hemorrhagic, inflamed, plaque covered                            and ulcerated mucosa from the recto-sigmoid colon                            to mid-sigmoid colon. Etiology most likely  ischemic, but multiple biopsies obtained for                            further characterization.                           - The proximal sigmoid colon and distal rectum are                            normal.                           - Multiple biopsies were obtained. Moderate Sedation:      Moderate (conscious) sedation was administered by the endoscopy nurse       and supervised by the endoscopist. The following parameters were       monitored: oxygen saturation, heart rate, blood  pressure, and response       to care. Total physician intraservice time was 18 minutes. Recommendation:           - Return patient to hospital ward for ongoing care.                           - Await pathology results.                           - Closely monitor Hgb. Transfuse as needed.                           - Would target lowest end of therapeutic range with                            anticoagulation. If significant rectal bleeding                            occurs, then anticoagulation will need to be held. Procedure Code(s):        --- Professional ---                           [phone], Sigmoidoscopy, flexible; with biopsy, single                            or multiple                           99152, Moderate sedation services provided by the                            same physician or other qualified health care                            professional performing the diagnostic or                            therapeutic service that the sedation supports,  requiring the presence of an independent trained                            observer to assist in the monitoring of the                            patient's level of consciousness and physiological                            status; initial 15 minutes of intraservice time,                            patient age 87 years or older Diagnosis Code(s):        --- Professional ---                           K63.3, Ulcer of intestine                           K62.5, Hemorrhage of anus and rectum                           D62, Acute posthemorrhagic anemia                           R93.3, Abnormal findings on diagnostic imaging of                            other parts of digestive tract CPT copyright 2016 [employer]. All rights reserved. The codes documented in this report are preliminary and upon coder review may  be revised to meet current compliance requirements. Jerene Bears, MD 08/25/2015  11:16:34 AM This report has been signed electronically. Number of Addenda: 0

## 2015-08-25 NOTE — Clinical Social Work Note (Signed)
CSW acknowledged receipt of social work consult for SNF placement.  CSW to meet with patient and complete assessment at a later time.  Jones Broom. Norval Morton, MSW, [place] 08/25/2015 6:37 PM

## 2015-08-25 NOTE — Progress Notes (Signed)
Patient sustaining SVT with HR 170's to 180's. Patient sitting on side of bed and trying to stand. Assisted patient to lay down. Instructed patient to cough deeply without change in HR. Patient stated he could feel his HR. HR dropped to 77. .  This episode lasted 6 minutes. Again at 2029 another episode of SVT HR 170's. VSS stable no drop in B/P. Patient lying down during this episode. This episode lasted 1 minute.  See stripes.  Will continue to monitor.

## 2015-08-25 NOTE — Progress Notes (Signed)
Inpatient Rehabilitation  Met with patient to discuss team's recommendation for IP Rehab post acute care stay.  I shared booklets and answered questions.  Plan to follow along for timing of medical readiness, insurance authorization, and bed availability.  Please call with questions.    Carmelia Roller., CCC/SLP Admission Coordinator  [place]  Cell [phone]

## 2015-08-25 NOTE — Progress Notes (Addendum)
ANTICOAGULATION CONSULT NOTE - Follow Up Consult  Pharmacy Consult for Heparin, coumadin  Indication: atrial fibrillation  No Known Allergies  Patient Measurements: Height: 5\' 6"  (167.6 cm) Weight: 264 lb 9.6 oz (120.022 kg) IBW/kg (Calculated) : 63.8 Heparin Dosing Weight: 94 kg  Vital Signs: Temp: 98.4 F (36.9 C) (06/23 0407) Temp Source: Oral (06/23 0407) BP: 160/70 mmHg (06/23 0600) Pulse Rate: 82 (06/23 0600)  Labs:  Recent Labs  08/23/15 0250 08/23/15 0853 08/24/15 0247 08/24/15 1813 08/25/15 0020 08/25/15 0423  HGB 8.8*  --  8.8* 8.5* 8.4* 8.4*  HCT 27.8*  --  27.6* 26.4* 26.2* 26.4*  PLT 195  --  232 229 231 239  LABPROT  --  16.9* 20.0*  --   --  35.8*  INR  --  1.36 1.70*  --   --  3.69*  HEPARINUNFRC 0.40  --  0.45  --   --  <0.10*  CREATININE 3.02*  --  2.73*  --   --  2.49*    Estimated Creatinine Clearance: 37.1 mL/min (by C-G formula based on Cr of 2.49).  . sodium chloride    . sodium chloride Stopped (08/21/15 1617)    Assessment: 37 YOM admitted 08/14/2015 with newly diagnosed AFlutter with RVR (s/p DCCV on 6/20). Pharmacy consulted to dose coumadin. INR= 3.69 and heparin has been discontinued. Patient noted with anemia and FOBT positive for flexible sigmoidoscopy today. -INR with significant elevation on day 2 of coumadin (LFTs WNL; may be due to antibiotics and vitamin K deficiency).  -Renal function improving  Goal of Therapy:  Heparin level 0.3-0.7 units/ml Monitor platelets by anticoagulation protocol: Yes   Plan:  -Hold coumadin today -When resuming anticoagulation may be able to consider apixiban if renal trend continues to improve -Daily PT/INR  Hildred Laser, Pharm D 08/25/2015 8:06 AM

## 2015-08-25 NOTE — Progress Notes (Signed)
Physical Therapy Treatment Patient Details Name: Frank Hoover MRN: [id number] DOB: [birth year] Today's Date: 08/25/2015    History of Present Illness 65 year old male with PMH of GERD, OSA, COPD, admitted with dizziness, tachycardia and leg edema. Dx with acute cardiogenic/septic shock, acute diastolic CHF, A-flutter, intubated 6/13-6/18. Pt underwent TEE with cardioversion 6/20. Developed significant anemia, Hemoccult-positive stool S/P Flex Sig 6/23, suspicious for ischemic colitis.    PT Comments    Patient more alert and doing better today. Remains very weak (unable to ambulate) but did stand twice with 2 person assist. Resting HR 84, Max HR 118  Follow Up Recommendations  CIR     Equipment Recommendations  Other (comment) (TBA)    Recommendations for Other Services       Precautions / Restrictions Precautions Precautions: Fall Restrictions Weight Bearing Restrictions: No    Mobility  Bed Mobility                  Transfers Overall transfer level: Needs assistance Equipment used: Rolling walker (2 wheeled) Transfers: Sit to/from Stand Sit to Stand: Mod assist;+2 physical assistance;+2 safety/equipment         General transfer comment: from recliner; x 2; stood 40 seconds, then 2 minutes (RN applying dressings to buttocks) and very shakey/exhausted at the end  Ambulation/Gait             General Gait Details: pt too fatigued/weak   Financial trader Rankin (Stroke Patients Only)       Balance           Standing balance support: Bilateral upper extremity supported Standing balance-Leahy Scale: Poor Standing balance comment: initially flexed at hips; with cues able to push through UEs on RW and stand more erect                    Cognition Arousal/Alertness: Awake/alert Behavior During Therapy: WFL for tasks assessed/performed Overall Cognitive Status: Impaired/Different from  baseline Area of Impairment: Following commands;Problem solving       Following Commands: Follows one step commands with increased time     Problem Solving: Slow processing;Decreased initiation;Difficulty sequencing;Requires verbal cues;Requires tactile cues      Exercises General Exercises - Lower Extremity Heel Raises: AROM;Both;10 reps;Seated (pushing toes into floor and raising heels) Mini-Sqauts:  (pt unwilling to attempt as he was afraid legs would not supp) Low Level/ICU Exercises Ankle Circles/Pumps: Both;10 reps;Seated;AROM    General Comments        Pertinent Vitals/Pain Pain Assessment: Faces Faces Pain Scale: Hurts little more Pain Location: low back and buttocks Pain Descriptors / Indicators: Grimacing;Guarding Pain Intervention(s): Limited activity within patient's tolerance;Monitored during session;Repositioned    Home Living                      Prior Function            PT Goals (current goals can now be found in the care plan section) Acute Rehab PT Goals Patient Stated Goal: back home Time For Goal Achievement: 09/05/15 Progress towards PT goals: Progressing toward goals    Frequency  Min 3X/week    PT Plan Current plan remains appropriate    Co-evaluation             End of Session Equipment Utilized During Treatment: Gait belt Activity Tolerance: Patient limited by fatigue Patient left: with call bell/phone within  reach;in chair;with nursing/sitter in room;with family/visitor present     Time: 6269-4854 PT Time Calculation (min) (ACUTE ONLY): 29 min  Charges:  $Therapeutic Activity: 23-37 mins                    G Codes:      Arthi Mcdonald 04-Sep-2015, 2:43 PM Pager [phone]

## 2015-08-25 NOTE — Progress Notes (Addendum)
PROGRESS NOTE    Frank Hoover  LNL:[id number] DOB: [birth year] DOA: 08/14/2015 PCP: Alonza Bogus, MD    Brief Narrative:  65 year old male was originally seen by his nephrologist in [place], was found to be in A. fib with RVR , admitted to any pain on 08/15/15 , noted to be in a flutter with RVR which is new diagnosis, 2-D echo showing EF 25%, and developed septic and cardiogenic shock, febrile 104, transferred to PCCM at [facility], treated for CAP/aspiration pneumonia and bacteremia, requiring  pressor support, She required intubation by PCCM, successfully extubated on 6/19 Much improved and was transferred to [facility] on 6/20.  Patient was seen by cardiology and EP for atrial flutter, and SVT,initially  on heparin drip, transitioned to by mouth warfarin, with significant anemia, Hemoccult-positive stool, seen by GI, S/P Flex Sig, suspicious for ischemic colitis.    Assessment & Plan:   Principal Problem:   Atrial flutter with rapid ventricular response (HCC) Active Problems:   GERD   DM (diabetes mellitus) (HCC)   HTN (hypertension)   Hyperlipemia   Sleep apnea   Systolic CHF (HCC)   Acute respiratory failure (HCC)   Septic shock (HCC)   Acute encephalopathy   Acute kidney injury ([place])   Acute systolic heart failure (HCC)   SVT (supraventricular tachycardia) ([place])   Debility   Blood in stool   Acute blood loss anemia   Abnormal CT scan, colon   Acute hypoxic resp failure - With history of OSA, COPD, and cardiogenic/septic shock - Bipap Qhs for sleep apnea - improving, currently on 2 L nasal cannula  Septic shock with citrobacter bacteremia  - Shock resolved  Citrobacter bacteremia in the setting of UTI - Blood cultures and urine culture growing Citrobacter, treated with total of 8 days of Antibiotics, resumed Rocephin 6/22 for total of 2 weeks treatment (stop date 6/28) - Will repeat blood cultures 6/22  HCAP - Treated  Cardiogenic shock/acute  systolic CHF  with EF 40% - Currently off pressors, management per cardiology, on beta blockers and hydralazine - Diuresis per cardiology, appears to be improving, not on ACEI/ARB/spiral due to ARF.  Atrial fib - Management per cardiology, status post s/p  TEE/DCCV 08/22/15, in NSR, on amiodarone and beta blockers - On anticoagulation, transitioned from heparin GTT to warfarin, INR supratherapeutic today to hold warfarin, will aim for therapeutic range on the lower side secondary to GI bleed.  Lower GI bleed - most likely in the setting of ischemic colitis, status post flexible sigmoidoscopy on 6/23, follow on biopsies, continue to monitor hemoglobin closely and transfuse as needed,  - Monitor closely as an anticoagulation, try to keep INR on the lower side of therapeutic range.  Episode of SVT on 08/23/18 - required Cardizem as such, IV adenosine, EP consult appreciated  HTN - Blood pressure acceptable, clonidine wean down, started on metoprolol  AKI on CKD  - Continue to monitor, improving despite diuresis  Anemia  - Multifactorial, setting of chronic kidney disease, chronic illness, and GI bleed, monitor closely and transfuse as needed  DM  - Better controlled after increasing Lantus, continue with insulin sliding scale .  Urinary retention -foley per urology, to remain for several weeks to allow urethra healing better urology recommendation.  Acute metabolic encephalopathy - Improving    DVT prophylaxis:  Heparin gtt  Code Status: Full Code   Family Communication: Wife at bedside  Disposition Plan:  Remain in SDU, seen by CIR, I would anticipate he'll be  medically ready in 2-3 days.   Consultants:   Cards  PCCM  Urology  Inpatient rehabilitation  Procedures:  6/13 R Femoral CVL >> 6/13 6/13 ETT >> 6/18 6/13 Lt radial aline >> 6/17 6/13 RIJ CVL >>>plan out 6/19    Subjective: Feeling better, still having diarrhea after receiving enema overnight for flexible  sigmoidoscopy.  Objective: Filed Vitals:   08/25/15 1108 08/25/15 1110 08/25/15 1120 08/25/15 1225  BP: 132/36  133/34   Pulse:  68 76   Temp: 98.6 F (37 C)   98.3 F (36.8 C)  TempSrc: Oral   Oral  Resp: 19 20 20    Height:      Weight:      SpO2: 99% 99% 99%     Intake/Output Summary (Last 24 hours) at 08/25/15 1341 Last data filed at 08/25/15 0600  Gross per 24 hour  Intake    648 ml  Output   3025 ml  Net  -2377 ml   Filed Weights   08/23/15 0413 08/24/15 0406 08/25/15 0407  Weight: 121.246 kg (267 lb 4.8 oz) 119.477 kg (263 lb 6.4 oz) 120.022 kg (264 lb 9.6 oz)    Examination:  General exam: Awake, alert,  Respiratory system: diminshed, no wheezing, Respiratory effort normal. Cardiovascular system: S1 & S2 heard, RRR. No JVD, murmurs, rubs, gallops or clicks. No pedal edema. Gastrointestinal system: Abdomen is nondistended, soft and nontender, obese. Lower extremity: + edema, pulses felt bilaterally     Data Reviewed: I have personally reviewed following labs and imaging studies  CBC:  Recent Labs Lab 08/23/15 0250 08/24/15 0247 08/24/15 1813 08/25/15 0020 08/25/15 0423  WBC 10.2 10.4 9.4 8.4 8.1  HGB 8.8* 8.8* 8.5* 8.4* 8.4*  HCT 27.8* 27.6* 26.4* 26.2* 26.4*  MCV 87.7 87.1 86.3 87.0 86.8  PLT 195 232 229 231 330   Basic Metabolic Panel:  Recent Labs Lab 08/20/15 0430 08/20/15 1335 08/21/15 0430 08/22/15 1030 08/23/15 0250 08/24/15 0247 08/25/15 0423  NA 139  --  144 138 138 135 136  K 3.4*  --  3.2* 4.7 3.6 3.1* 3.8  CL 101  --  105 104 102 102 106  CO2 25  --  27 28 27 25 25   GLUCOSE 256*  --  281* 263* 248* 161* 170*  BUN 77*  --  77* 74* 73* 61* 47*  CREATININE 3.85*  --  3.25* 3.13* 3.02* 2.73* 2.49*  CALCIUM 8.3*  --  8.6* 8.2* 8.1* 8.0* 8.1*  MG 2.5* 2.5* 2.3  --   --  1.8  --   PHOS 4.2  --  3.1  --   --   --   --    GFR: Estimated Creatinine Clearance: 37.1 mL/min (by C-G formula based on Cr of 2.49). Liver Function  Tests:  Recent Labs Lab 08/25/15 0423  AST 38  ALT 41  ALKPHOS 57  BILITOT 0.1*  PROT 5.4*  ALBUMIN 1.8*   No results for input(s): LIPASE, AMYLASE in the last 168 hours. No results for input(s): AMMONIA in the last 168 hours. Coagulation Profile:  Recent Labs Lab 08/23/15 0853 08/24/15 0247 08/25/15 0423  INR 1.36 1.70* 3.69*   Cardiac Enzymes: No results for input(s): CKTOTAL, CKMB, CKMBINDEX, TROPONINI in the last 168 hours. BNP (last 3 results) No results for input(s): PROBNP in the last 8760 hours. HbA1C: No results for input(s): HGBA1C in the last 72 hours. CBG:  Recent Labs Lab 08/24/15 1207 08/24/15 1723 08/24/15 2145 08/25/15  1937 08/25/15 1228  GLUCAP 211* 145* 182* 218* 199*   Lipid Profile: No results for input(s): CHOL, HDL, LDLCALC, TRIG, CHOLHDL, LDLDIRECT in the last 72 hours. Thyroid Function Tests: No results for input(s): TSH, T4TOTAL, FREET4, T3FREE, THYROIDAB in the last 72 hours. Anemia Panel: No results for input(s): VITAMINB12, FOLATE, FERRITIN, TIBC, IRON, RETICCTPCT in the last 72 hours. Urine analysis:    Component Value Date/Time   COLORURINE AMBER* 08/14/2015 2125   APPEARANCEUR HAZY* 08/14/2015 2125   LABSPEC 1.015 08/14/2015 2125   PHURINE 5.0 08/14/2015 2125   GLUCOSEU 100* 08/14/2015 2125   HGBUR LARGE* 08/14/2015 2125   BILIRUBINUR NEGATIVE 08/14/2015 2125   KETONESUR NEGATIVE 08/14/2015 2125   PROTEINUR 100* 08/14/2015 2125   UROBILINOGEN 0.2 12/02/2013 1739   NITRITE NEGATIVE 08/14/2015 2125   LEUKOCYTESUR MODERATE* 08/14/2015 2125   Sepsis Labs: @LABRCNTIP (procalcitonin:4,lacticidven:4)  ) Recent Results (from the past 240 hour(s))  C difficile quick scan w PCR reflex     Status: None   Collection Time: 08/23/15  4:14 PM  Result Value Ref Range Status   C Diff antigen NEGATIVE NEGATIVE Final   C Diff toxin NEGATIVE NEGATIVE Final   C Diff interpretation Negative for toxigenic C. difficile  Final    Gastrointestinal Panel by PCR , Stool     Status: None   Collection Time: 08/24/15 11:29 AM  Result Value Ref Range Status   Campylobacter species NOT DETECTED NOT DETECTED Final   Plesimonas shigelloides NOT DETECTED NOT DETECTED Final   Salmonella species NOT DETECTED NOT DETECTED Final   Yersinia enterocolitica NOT DETECTED NOT DETECTED Final   Vibrio species NOT DETECTED NOT DETECTED Final   Vibrio cholerae NOT DETECTED NOT DETECTED Final   Enteroaggregative E coli (EAEC) NOT DETECTED NOT DETECTED Final   Enteropathogenic E coli (EPEC) NOT DETECTED NOT DETECTED Final   Enterotoxigenic E coli (ETEC) NOT DETECTED NOT DETECTED Final   Shiga like toxin producing E coli (STEC) NOT DETECTED NOT DETECTED Final   E. coli O157 NOT DETECTED NOT DETECTED Final   Shigella/Enteroinvasive E coli (EIEC) NOT DETECTED NOT DETECTED Final   Cryptosporidium NOT DETECTED NOT DETECTED Final   Cyclospora cayetanensis NOT DETECTED NOT DETECTED Final   Entamoeba histolytica NOT DETECTED NOT DETECTED Final   Giardia lamblia NOT DETECTED NOT DETECTED Final   Adenovirus F40/41 NOT DETECTED NOT DETECTED Final   Astrovirus NOT DETECTED NOT DETECTED Final   Norovirus GI/GII NOT DETECTED NOT DETECTED Final   Rotavirus A NOT DETECTED NOT DETECTED Final   Sapovirus (I, II, IV, and V) NOT DETECTED NOT DETECTED Final      Anti-infectives    Start     Dose/Rate Route Frequency Ordered Stop   08/24/15 1200  cefTRIAXone (ROCEPHIN) 2 g in dextrose 5 % 50 mL IVPB     2 g 100 mL/hr over 30 Minutes Intravenous Every 24 hours 08/24/15 1055     08/20/15 0000  cefTRIAXone (ROCEPHIN) 2 g in dextrose 5 % 50 mL IVPB  Status:  Discontinued     2 g 100 mL/hr over 30 Minutes Intravenous Every 24 hours 08/19/15 1112 08/22/15 1310   08/18/15 1100  cefTAZidime (FORTAZ) 2 g in dextrose 5 % 50 mL IVPB  Status:  Discontinued     2 g 100 mL/hr over 30 Minutes Intravenous Every 24 hours 08/18/15 1039 08/19/15 1112   08/17/15 1800   vancomycin (VANCOCIN) 1,500 mg in sodium chloride 0.9 % 500 mL IVPB  Status:  Discontinued  1,500 mg 250 mL/hr over 120 Minutes Intravenous Every 48 hours 08/15/15 1636 08/15/15 1956   08/16/15 2100  ceFEPIme (MAXIPIME) 1 g in dextrose 5 % 50 mL IVPB  Status:  Discontinued     1 g 100 mL/hr over 30 Minutes Intravenous Every 24 hours 08/15/15 1957 08/16/15 1415   08/16/15 2100  ceFEPIme (MAXIPIME) 2 g in dextrose 5 % 50 mL IVPB  Status:  Discontinued     2 g 100 mL/hr over 30 Minutes Intravenous Every 24 hours 08/16/15 1415 08/16/15 1741   08/16/15 1900  piperacillin-tazobactam (ZOSYN) IVPB 2.25 g  Status:  Discontinued     2.25 g 100 mL/hr over 30 Minutes Intravenous Every 8 hours 08/16/15 1809 08/18/15 1014   08/15/15 2100  ceFEPIme (MAXIPIME) 2 g in dextrose 5 % 50 mL IVPB     2 g 100 mL/hr over 30 Minutes Intravenous  Once 08/15/15 1957 08/15/15 2359   08/15/15 1730  vancomycin (VANCOCIN) 2,000 mg in sodium chloride 0.9 % 500 mL IVPB     2,000 mg 250 mL/hr over 120 Minutes Intravenous  Once 08/15/15 1636 08/15/15 2000   08/15/15 0900  piperacillin-tazobactam (ZOSYN) IVPB 3.375 g  Status:  Discontinued     3.375 g 12.5 mL/hr over 240 Minutes Intravenous Every 8 hours 08/15/15 0856 08/15/15 1956   08/15/15 0100  vancomycin (VANCOCIN) IVPB 1000 mg/200 mL premix     1,000 mg 200 mL/hr over 60 Minutes Intravenous STAT 08/15/15 0048 08/15/15 0206   08/14/15 2330  vancomycin (VANCOCIN) IVPB 1000 mg/200 mL premix     1,000 mg 200 mL/hr over 60 Minutes Intravenous STAT 08/14/15 2316 08/15/15 0041   08/14/15 2330  piperacillin-tazobactam (ZOSYN) IVPB 3.375 g     3.375 g 12.5 mL/hr over 240 Minutes Intravenous STAT 08/14/15 2317 08/15/15 0341       Radiology Studies: No results found.      Scheduled Meds: . amiodarone  400 mg Oral BID  . cefTRIAXone (ROCEPHIN)  IV  2 g Intravenous Q24H  . feeding supplement (GLUCERNA SHAKE)  237 mL Oral TID BM  . furosemide  40 mg Oral Daily    . hydrALAZINE  50 mg Oral Q8H  . insulin aspart  0-15 Units Subcutaneous TID WC  . insulin glargine  32 Units Subcutaneous Daily  . isosorbide mononitrate  60 mg Oral Daily  . metoprolol tartrate  50 mg Oral BID  . pantoprazole  40 mg Oral Daily  . sodium chloride flush  3 mL Intravenous Q12H  . Warfarin - Pharmacist Dosing Inpatient   Does not apply q1800   Continuous Infusions: . sodium chloride    . sodium chloride Stopped (08/21/15 1617)     LOS: 11 days    Time spent: 92 min    Nabeeha Badertscher, MD [facility] Pager [phone]  If 7PM-7AM, please contact night-coverage [email or web address] Password [facility] 08/25/2015, 1:41 PM

## 2015-08-25 NOTE — Progress Notes (Signed)
PT Cancellation Note  Patient Details Name: ROCIO WOLAK MRN: [id number] DOB: 10-13-1950   Cancelled Treatment:    Reason Eval/Treat Not Completed: Patient at procedure or test/unavailable   Dima Mini 08/25/2015, 11:11 AM Pager [phone]

## 2015-08-26 ENCOUNTER — Other Ambulatory Visit: Payer: Self-pay

## 2015-08-26 DIAGNOSIS — R570 Cardiogenic shock: Secondary | ICD-10-CM

## 2015-08-26 DIAGNOSIS — K6389 Other specified diseases of intestine: Secondary | ICD-10-CM

## 2015-08-26 DIAGNOSIS — K922 Gastrointestinal hemorrhage, unspecified: Secondary | ICD-10-CM

## 2015-08-26 LAB — BLOOD CULTURE ID PANEL (REFLEXED)

## 2015-08-26 LAB — CBC
HCT: 28.9 % — ABNORMAL LOW (ref 39.0–52.0)
HCT: 27.3 % — ABNORMAL LOW (ref 39.0–52.0)
Hemoglobin: 9 g/dL — ABNORMAL LOW (ref 13.0–17.0)
Hemoglobin: 8.6 g/dL — ABNORMAL LOW (ref 13.0–17.0)
MCH: 27.4 pg (ref 26.0–34.0)
MCH: 27.5 pg (ref 26.0–34.0)
MCHC: 31.1 g/dL (ref 30.0–36.0)
MCHC: 31.5 g/dL (ref 30.0–36.0)
MCV: 87.8 fL (ref 78.0–100.0)
MCV: 87.2 fL (ref 78.0–100.0)
Platelets: 290 10*3/uL (ref 150–400)
Platelets: 298 10*3/uL (ref 150–400)
RBC: 3.29 MIL/uL — ABNORMAL LOW (ref 4.22–5.81)
RBC: 3.13 MIL/uL — ABNORMAL LOW (ref 4.22–5.81)
RDW: 15.5 % (ref 11.5–15.5)
RDW: 15.3 % (ref 11.5–15.5)
WBC: 6.8 10*3/uL (ref 4.0–10.5)
WBC: 6.3 10*3/uL (ref 4.0–10.5)

## 2015-08-26 LAB — BASIC METABOLIC PANEL
Anion gap: 9 (ref 5–15)
BUN: 31 mg/dL — ABNORMAL HIGH (ref 6–20)
CO2: 22 mmol/L (ref 22–32)
Calcium: 8.5 mg/dL — ABNORMAL LOW (ref 8.9–10.3)
Chloride: 105 mmol/L (ref 101–111)
Creatinine, Ser: 2.06 mg/dL — ABNORMAL HIGH (ref 0.61–1.24)
GFR calc Af Amer: 38 mL/min — ABNORMAL LOW (ref >60)
GFR calc non Af Amer: 33 mL/min — ABNORMAL LOW (ref >60)
Glucose, Bld: 191 mg/dL — ABNORMAL HIGH (ref 65–99)
Potassium: 3.7 mmol/L (ref 3.5–5.1)
Sodium: 136 mmol/L (ref 135–145)

## 2015-08-26 LAB — GLUCOSE, CAPILLARY
Glucose-Capillary: 196 mg/dL — ABNORMAL HIGH (ref 65–99)
Glucose-Capillary: 193 mg/dL — ABNORMAL HIGH (ref 65–99)
Glucose-Capillary: 257 mg/dL — ABNORMAL HIGH (ref 65–99)
Glucose-Capillary: 166 mg/dL — ABNORMAL HIGH (ref 65–99)

## 2015-08-26 LAB — PROTIME-INR
INR: 6.36 (ref 0.00–1.49)
Prothrombin Time: 53.9 seconds — ABNORMAL HIGH (ref 11.6–15.2)

## 2015-08-26 MED ORDER — AMIODARONE HCL IN DEXTROSE 360-4.14 MG/200ML-% IV SOLN
INTRAVENOUS | Status: AC
  Filled 2015-08-26: qty 200

## 2015-08-26 MED ORDER — ENSURE ENLIVE PO LIQD
237.0000 mL | Freq: Three times a day (TID) | ORAL | Status: DC
  Administered 2015-08-27 – 2015-09-06 (×20): 237 mL via ORAL

## 2015-08-26 MED ORDER — DEXTROSE 5 % IV SOLN
5.0000 mg | Freq: Once | INTRAVENOUS | Status: AC
  Administered 2015-08-26: 5 mg via INTRAVENOUS
  Filled 2015-08-26: qty 0.5

## 2015-08-26 MED ORDER — HYDRALAZINE HCL 50 MG PO TABS
75.0000 mg | ORAL_TABLET | Freq: Three times a day (TID) | ORAL | Status: DC
  Administered 2015-08-26 – 2015-09-08 (×38): 75 mg via ORAL
  Filled 2015-08-26 (×38): qty 1

## 2015-08-26 MED ORDER — VANCOMYCIN HCL 10 G IV SOLR
1500.0000 mg | INTRAVENOUS | Status: DC
  Administered 2015-08-26: 1500 mg via INTRAVENOUS
  Filled 2015-08-26: qty 1500

## 2015-08-26 MED ORDER — METOPROLOL TARTRATE 5 MG/5ML IV SOLN
5.0000 mg | Freq: Four times a day (QID) | INTRAVENOUS | Status: DC | PRN
Start: 2015-08-26 — End: 2015-09-08
  Administered 2015-08-27 – 2015-08-31 (×2): 5 mg via INTRAVENOUS
  Filled 2015-08-26 (×2): qty 5

## 2015-08-26 MED ORDER — OXYBUTYNIN CHLORIDE 5 MG PO TABS
5.0000 mg | ORAL_TABLET | Freq: Three times a day (TID) | ORAL | Status: DC
  Administered 2015-08-26 – 2015-09-08 (×38): 5 mg via ORAL
  Filled 2015-08-26 (×39): qty 1

## 2015-08-26 MED ORDER — AMIODARONE HCL IN DEXTROSE 360-4.14 MG/200ML-% IV SOLN
30.0000 mg/h | INTRAVENOUS | Status: DC
  Administered 2015-08-27 – 2015-08-30 (×7): 30 mg/h via INTRAVENOUS
  Filled 2015-08-26 (×8): qty 200

## 2015-08-26 MED ORDER — METOPROLOL TARTRATE 5 MG/5ML IV SOLN
INTRAVENOUS | Status: AC
  Administered 2015-08-26: 5 mg
  Filled 2015-08-26: qty 5

## 2015-08-26 MED ORDER — METOPROLOL TARTRATE 50 MG PO TABS
75.0000 mg | ORAL_TABLET | Freq: Two times a day (BID) | ORAL | Status: DC
  Administered 2015-08-26 – 2015-08-29 (×7): 75 mg via ORAL
  Filled 2015-08-26 (×7): qty 1

## 2015-08-26 MED ORDER — AMIODARONE HCL IN DEXTROSE 360-4.14 MG/200ML-% IV SOLN
60.0000 mg/h | INTRAVENOUS | Status: AC
  Administered 2015-08-26: 60 mg/h via INTRAVENOUS

## 2015-08-26 NOTE — Progress Notes (Addendum)
Patient: Frank Hoover / Admit Date: 08/14/2015 / Date of Encounter: 08/26/2015, 9:17 AM  Subjective: No chest pain or SOB. He appears very deconditioned, hasn't been out of bed since the admission.   Objective: Telemetry: NSR 70s Physical Exam: Blood pressure 179/98, pulse 79, temperature 98.5 F (36.9 C), temperature source Oral, resp. rate 15, height 5' 6"  (1.676 m), weight 261 lb 1.6 oz (118.434 kg), SpO2 100 %. General: Well developed obese AAM in no acute distress. Head: Normocephalic, atraumatic, sclera non-icteric, no xanthomas, nares are without discharge. Divergence of eyes noted. Neck: Negative for carotid bruits. JVP not elevated. Lungs: Clear bilaterally to auscultation without wheezes, rales, or rhonchi. Breathing is unlabored. Heart: RRR S1 S2 without murmurs, rubs, or gallops.  Abdomen: Soft, non-tender, non-distended with normoactive bowel sounds. No rebound/guarding. Extremities: No clubbing or cyanosis. trace pedal edema. Distal pedal pulses are 2+ and equal bilaterally. Neuro: Alert and oriented X 3. Moves all extremities spontaneously. Psych:  Responds to questions appropriately with a flat affect.  Intake/Output Summary (Last 24 hours) at 08/26/15 0917 Last data filed at 08/26/15 0800  Gross per 24 hour  Intake    510 ml  Output   2150 ml  Net  -1640 ml   Inpatient Medications:  . amiodarone  400 mg Oral BID  . cefTRIAXone (ROCEPHIN)  IV  2 g Intravenous Q24H  . feeding supplement (ENSURE ENLIVE)  237 mL Oral TID BM  . feeding supplement (GLUCERNA SHAKE)  237 mL Oral TID BM  . furosemide  40 mg Oral Daily  . hydrALAZINE  50 mg Oral Q8H  . insulin aspart  0-15 Units Subcutaneous TID WC  . insulin glargine  32 Units Subcutaneous Daily  . isosorbide mononitrate  60 mg Oral Daily  . metoprolol tartrate  50 mg Oral BID  . pantoprazole  40 mg Oral Daily  . phytonadione (VITAMIN K) IV  5 mg Intravenous Once  . sodium chloride flush  3 mL Intravenous Q12H  .  vancomycin  1,500 mg Intravenous Q48H  . Warfarin - Pharmacist Dosing Inpatient   Does not apply q1800   Infusions:  . sodium chloride    . sodium chloride Stopped (08/21/15 1617)   Labs:  Recent Labs  08/24/15 0247 08/25/15 0423 08/26/15 0620  NA 135 136 136  K 3.1* 3.8 3.7  CL 102 106 105  CO2 25 25 22   GLUCOSE 161* 170* 191*  BUN 61* 47* 31*  CREATININE 2.73* 2.49* 2.06*  CALCIUM 8.0* 8.1* 8.5*  MG 1.8  --   --     Recent Labs  08/25/15 0423  AST 38  ALT 41  ALKPHOS 57  BILITOT 0.1*  PROT 5.4*  ALBUMIN 1.8*    Recent Labs  08/25/15 0423 08/26/15 0620  WBC 8.1 6.8  HGB 8.4* 9.0*  HCT 26.4* 28.9*  MCV 86.8 87.8  PLT 239 290   Telemetry: Personally reviewed - SR in 70", the last night at 11 pm there was an episode of a-fib with RVR with HR 160 BPM lasting < 1 hour    Assessment and Plan   30M with history of CKD stage III, COPD, HTN, gout, DM admitted after presenting for an echocardiogram ordered by his nephrologist secondary to elevated heart rate and leg edema. He then was referred to the ER after call placed to Dr. Lowanda Foster reporting patient heart rate in the 120s and associated dizziness. He was found to be in newly recognized atrial flutter RVR of  unknown duration, acute cardiogenic/septic shock requiring pressors & IV abx (citrobacter bacteremia), acute respiratory failure requiring ventilation, EF 25%. Ischemic workup not yet pursued due to AKI on CKD with Cr peak over 5. Elevated flat troponin 6/12-6/14. Atrial flutter RVR persisted despite amiodarone loading thus underwent TEE/DCCV 08/22/15 with successful conversion to NSR. On (08/23/15) was noted to go into rapid SVT HR 170-180s with associated chest discomfort and fall in BP. Responded once to carotid massage and a second time to IV adenosine.   1. Acute cardiogenic/septic shock and acute respiratory failure - off pressors, off vent, BP stable   2. Acute systolic CHF - continues to diurese well on po  lasix. Dose reduced to 40 mg daily in the last two days, I&O -1.7 L overnight.  Weight down 11 lbs since admit. Not on ACEI/ARB/spiro due to ARF. Appears to be  euvolemia.  Increase metoprolol to 75 mg po BID. Titrate oral hydralazine and nitrates since BP elevated. Repeat Echo in 2 months to reassess EF.   3. Atrial flutter RVR - s/p TEE/DCCV 08/22/15. INR now supratherapeutic on coumadin. IV heparin discontinued.  On oral amiodarone and beta blocker. Long term coumadin may be problematic since patient has a history of refusing to go to physician appointments. At this point not a good candidate for DOAC due to ARF. If renal function continues to improve may consider switching to a DOAC at a later time. Pharmacy dosing coumadin. I will increase metoprolol to 75 mg po BID as he is hypertensive and had two more episodes of a-fib the last night.  4. AKI on CKD stage III - Cr continues to gradually improve. Avoid contrast studies.  5. Progressive anemia - ? Due to critical illness/CKD. Stool heme positive. ? Ischemic colitis secondary to shock. Possible flex sig per GI.   6. Hypokalemia- repleted  7. HTN will titrate hydralazine/nitrates for BP.  Signed,  Ena Dawley, [place] 08/26/2015 9:17 AM

## 2015-08-26 NOTE — Progress Notes (Signed)
Pt was sitting in chair, pt heart rate went into 180-190, pt c/o of sob, dizziness, along with two other rn pt was placed back into bed, heart went into 130 when pt was placed back in bed. Pt states he feels better, Dr. Meda Coffee paged, awaiting orders. Will continue to monitor.

## 2015-08-26 NOTE — Progress Notes (Addendum)
ANTICOAGULATION CONSULT NOTE - Follow Up Consult  Pharmacy Consult for Heparin, coumadin  Indication: atrial fibrillation  No Known Allergies  Patient Measurements: Height: 5\' 6"  (167.6 cm) Weight: 261 lb 1.6 oz (118.434 kg) IBW/kg (Calculated) : 63.8 Heparin Dosing Weight: 94 kg  Vital Signs: Temp: 98.5 F (36.9 C) (06/24 0752) Temp Source: Oral (06/24 0752) BP: 164/62 mmHg (06/24 0752) Pulse Rate: 82 (06/24 0752)  Labs:  Recent Labs  08/24/15 0247  08/25/15 0020 08/25/15 0423 08/26/15 0620  HGB 8.8*  < > 8.4* 8.4* 9.0*  HCT 27.6*  < > 26.2* 26.4* 28.9*  PLT 232  < > 231 239 290  LABPROT 20.0*  --   --  35.8* 53.9*  INR 1.70*  --   --  3.69* 6.36*  HEPARINUNFRC 0.45  --   --  <0.10*  --   CREATININE 2.73*  --   --  2.49* 2.06*  < > = values in this interval not displayed.  Estimated Creatinine Clearance: 44.4 mL/min (by C-G formula based on Cr of 2.06).  . sodium chloride    . sodium chloride Stopped (08/21/15 1617)   Assessment: 45 YOM admitted 08/14/2015 with newly diagnosed AFlutter with RVR (s/p DCCV on 6/20). Pharmacy consulted to dose coumadin. INR= 6.36 and heparin has been discontinued. Patient noted with anemia and FOBT positive for flexible sigmoidoscopy 6/23 - likely ischemic colitis.  No bleeding noted this morning and CBC stable. -INR with significant elevation on day 3 of coumadin (LFTs WNL; may be due to antibiotics and vitamin K deficiency).  -Renal function improving  Goal of Therapy:  Heparin level 0.3-0.7 units/ml Monitor platelets by anticoagulation protocol: Yes   Plan:  -Hold coumadin today -Daily PT/INR -Consider low dose Vit. K if you feel indicated  Rober Minion, PharmD., MS Clinical Pharmacist Pager:  [phone] Thank you for allowing pharmacy to be part of this patients care team. 08/26/2015 8:41 AM

## 2015-08-26 NOTE — Progress Notes (Signed)
[place] NOTE   Pharmacy Consult for Medication review Indication: Drug/Drug Interactions with Amiodarone  Assessment: Pt with A.Fib with RVR, started on IV amiodarone for rate control.  Anticoagulation stopped due to bleeding.  We have been asked to evaluate medications for possible drug/drug interactions with Amiodarone.  Current Drug/Durg Interacting Medications  1.  Amiodarone / Ondansetron Significance: Major Warning: Additive QT interval prolongation may occur during coadministration of amiodarone and parenteral administration of ondansetron.  Plan:   - Would review QTc prior to dose of Ondansetron and use alternative if > 500.  - No further drug/drug interactions with current medication therapy  Frank Hoover, PharmD., MS Clinical Pharmacist Pager:  [phone] Thank you for allowing pharmacy to be part of this patients care team.  08/26/2015,1:39 PM

## 2015-08-26 NOTE — Progress Notes (Signed)
PROGRESS NOTE    Frank Hoover  DUK:[id number] DOB: 08-02-1950 DOA: 08/14/2015 PCP: Alonza Bogus, MD    Brief Narrative:  65 year old male was originally seen by his nephrologist in [place], was found to be in A. fib with RVR , admitted to any pain on 08/15/15 , noted to be in a flutter with RVR which is new diagnosis, 2-D echo showing EF 25%, and developed septic and cardiogenic shock, febrile 104, transferred to PCCM at [facility], treated for  Bacteremia due to UTI, requiring  pressor support, he required intubation by PCCM, successfully extubated on 6/19 Much improved and was transferred to [facility] on 6/20.  Patient was seen by cardiology and EP for atrial flutter, and SVT,initially  on heparin drip, transitioned to by mouth warfarin, with significant anemia, Hemoccult-positive stool, seen by GI, S/P Flex Sig, suspicious for ischemic colitis.    Assessment & Plan:   Principal Problem:   Atrial flutter with rapid ventricular response (HCC) Active Problems:   GERD   DM (diabetes mellitus) (HCC)   HTN (hypertension)   Hyperlipemia   Sleep apnea   Systolic CHF (HCC)   Acute respiratory failure (HCC)   Septic shock (HCC)   Acute encephalopathy   Acute kidney injury ([place])   Acute systolic heart failure (HCC)   SVT (supraventricular tachycardia) ([place])   Debility   Blood in stool   Acute blood loss anemia   Abnormal CT scan, colon   Proctosigmoiditis   Acute hypoxic resp failure - With history of OSA, COPD, and cardiogenic/septic shock - Bipap Qhs for sleep apnea - improving, currently on 2 L nasal cannula  Septic shock with citrobacter bacteremia  - Shock resolved  Citrobacter bacteremia in the setting of UTI - Blood cultures and urine culture growing Citrobacter, treated with total of 8 days of Antibiotics, resumed Rocephin 6/22 for total of 2 weeks treatment (stop date 6/28) -  repeat blood cultures 6/22: One out of 2 bottles growing gram-positive cocci on 6/24,  started empirically IV vancomycin pending final results, this is most likely contaminant   Lower GI bleed - most likely in the setting of ischemic colitis, status post flexible sigmoidoscopy on 6/23, follow on biopsies, continue to monitor hemoglobin closely and transfuse as needed,  - Monitor closely as an anticoagulation, try to keep INR on the lower side of therapeutic range. - Patient with significantly elevated INR today, hemoglobin remained stable, will monitor closely and transfuse as needed.  Supratherapeutic INR - INR 6.3 today, will give one dose of IV vitamin K.  HCAP - Treated  Cardiogenic shock/acute  systolic CHF with EF 75% - Currently off pressors, management per cardiology, on beta blockers and hydralazine - Diuresis per cardiology, appears to be improving, not on ACEI/ARB/spiral due to ARF.  Atrial fib - Management per cardiology, status post s/p  TEE/DCCV 08/22/15, in NSR, on amiodarone and beta blockers - On anticoagulation, transitioned from heparin GTT to warfarin, will aim for therapeutic range on the lower side secondary to GI bleed.  Episode of SVT on 08/23/18 - required Cardizem as such, IV adenosine, EP consult appreciated  HTN - Blood pressure acceptable, clonidine wean down, started on metoprolol  AKI on CKD  - Continue to monitor, improving despite diuresis  Anemia  - Multifactorial, setting of chronic kidney disease, chronic illness, and GI bleed, monitor closely and transfuse as needed  DM  - Better controlled after increasing Lantus, continue with insulin sliding scale .  Urinary retention -foley per urology,  to remain for several weeks to allow urethra healing better urology recommendation.  Acute metabolic encephalopathy - Improving    DVT prophylaxis:  On warfarin  Code Status: Full Code   Family Communication: none at bedside  Disposition Plan:  Remain in SDU, seen by CIR, I would anticipate he'll be medically ready in 2-3  days.   Consultants:   Cards  PCCM  Urology  Inpatient rehabilitation  Procedures:  6/13 R Femoral CVL >> 6/13 6/13 ETT >> 6/18 6/13 Lt radial aline >> 6/17 6/13 RIJ CVL >>>plan out 6/19    Subjective: Feeling better, report mild dyspnea, at baseline, denies chest pain, reports fatigue.  Objective: Filed Vitals:   08/26/15 0516 08/26/15 0752 08/26/15 0800 08/26/15 0954  BP: 130/66 164/62 179/98 150/79  Pulse:  82 79 114  Temp:  98.5 F (36.9 C)    TempSrc:  Oral    Resp:  18 15 23   Height:      Weight:      SpO2:  100% 100% 99%    Intake/Output Summary (Last 24 hours) at 08/26/15 1045 Last data filed at 08/26/15 1014  Gross per 24 hour  Intake    620 ml  Output   2150 ml  Net  -1530 ml   Filed Weights   08/24/15 0406 08/25/15 0407 08/26/15 0500  Weight: 119.477 kg (263 lb 6.4 oz) 120.022 kg (264 lb 9.6 oz) 118.434 kg (261 lb 1.6 oz)    Examination:  General exam: Awake, alert,  Respiratory system: diminshed, no wheezing, Respiratory effort normal. Cardiovascular system: S1 & S2 heard, RRR. No JVD, murmurs, rubs, gallops or clicks. No pedal edema. Gastrointestinal system: Abdomen is nondistended, soft and nontender, obese. Lower extremity: + edema, pulses felt bilaterally     Data Reviewed: I have personally reviewed following labs and imaging studies  CBC:  Recent Labs Lab 08/24/15 0247 08/24/15 1813 08/25/15 0020 08/25/15 0423 08/26/15 0620  WBC 10.4 9.4 8.4 8.1 6.8  HGB 8.8* 8.5* 8.4* 8.4* 9.0*  HCT 27.6* 26.4* 26.2* 26.4* 28.9*  MCV 87.1 86.3 87.0 86.8 87.8  PLT 232 229 231 239 700   Basic Metabolic Panel:  Recent Labs Lab 08/20/15 0430 08/20/15 1335 08/21/15 0430 08/22/15 1030 08/23/15 0250 08/24/15 0247 08/25/15 0423 08/26/15 0620  NA 139  --  144 138 138 135 136 136  K 3.4*  --  3.2* 4.7 3.6 3.1* 3.8 3.7  CL 101  --  105 104 102 102 106 105  CO2 25  --  27 28 27 25 25 22   GLUCOSE 256*  --  281* 263* 248* 161* 170*  191*  BUN 77*  --  77* 74* 73* 61* 47* 31*  CREATININE 3.85*  --  3.25* 3.13* 3.02* 2.73* 2.49* 2.06*  CALCIUM 8.3*  --  8.6* 8.2* 8.1* 8.0* 8.1* 8.5*  MG 2.5* 2.5* 2.3  --   --  1.8  --   --   PHOS 4.2  --  3.1  --   --   --   --   --    GFR: Estimated Creatinine Clearance: 44.4 mL/min (by C-G formula based on Cr of 2.06). Liver Function Tests:  Recent Labs Lab 08/25/15 0423  AST 38  ALT 41  ALKPHOS 57  BILITOT 0.1*  PROT 5.4*  ALBUMIN 1.8*   No results for input(s): LIPASE, AMYLASE in the last 168 hours. No results for input(s): AMMONIA in the last 168 hours. Coagulation Profile:  Recent Labs  Lab 08/23/15 0853 08/24/15 0247 08/25/15 0423 08/26/15 0620  INR 1.36 1.70* 3.69* 6.36*   Cardiac Enzymes: No results for input(s): CKTOTAL, CKMB, CKMBINDEX, TROPONINI in the last 168 hours. BNP (last 3 results) No results for input(s): PROBNP in the last 8760 hours. HbA1C: No results for input(s): HGBA1C in the last 72 hours. CBG:  Recent Labs Lab 08/25/15 0849 08/25/15 1228 08/25/15 1646 08/25/15 2127 08/26/15 0750  GLUCAP 218* 199* 178* 180* 196*   Lipid Profile: No results for input(s): CHOL, HDL, LDLCALC, TRIG, CHOLHDL, LDLDIRECT in the last 72 hours. Thyroid Function Tests: No results for input(s): TSH, T4TOTAL, FREET4, T3FREE, THYROIDAB in the last 72 hours. Anemia Panel: No results for input(s): VITAMINB12, FOLATE, FERRITIN, TIBC, IRON, RETICCTPCT in the last 72 hours. Urine analysis:    Component Value Date/Time   COLORURINE AMBER* 08/14/2015 2125   APPEARANCEUR HAZY* 08/14/2015 2125   LABSPEC 1.015 08/14/2015 2125   PHURINE 5.0 08/14/2015 2125   GLUCOSEU 100* 08/14/2015 2125   HGBUR LARGE* 08/14/2015 2125   BILIRUBINUR NEGATIVE 08/14/2015 2125   KETONESUR NEGATIVE 08/14/2015 2125   PROTEINUR 100* 08/14/2015 2125   UROBILINOGEN 0.2 12/02/2013 1739   NITRITE NEGATIVE 08/14/2015 2125   LEUKOCYTESUR MODERATE* 08/14/2015 2125   Sepsis  Labs: @LABRCNTIP (procalcitonin:4,lacticidven:4)  ) Recent Results (from the past 240 hour(s))  C difficile quick scan w PCR reflex     Status: None   Collection Time: 08/23/15  4:14 PM  Result Value Ref Range Status   C Diff antigen NEGATIVE NEGATIVE Final   C Diff toxin NEGATIVE NEGATIVE Final   C Diff interpretation Negative for toxigenic C. difficile  Final  Gastrointestinal Panel by PCR , Stool     Status: None   Collection Time: 08/24/15 11:29 AM  Result Value Ref Range Status   Campylobacter species NOT DETECTED NOT DETECTED Final   Plesimonas shigelloides NOT DETECTED NOT DETECTED Final   Salmonella species NOT DETECTED NOT DETECTED Final   Yersinia enterocolitica NOT DETECTED NOT DETECTED Final   Vibrio species NOT DETECTED NOT DETECTED Final   Vibrio cholerae NOT DETECTED NOT DETECTED Final   Enteroaggregative E coli (EAEC) NOT DETECTED NOT DETECTED Final   Enteropathogenic E coli (EPEC) NOT DETECTED NOT DETECTED Final   Enterotoxigenic E coli (ETEC) NOT DETECTED NOT DETECTED Final   Shiga like toxin producing E coli (STEC) NOT DETECTED NOT DETECTED Final   E. coli O157 NOT DETECTED NOT DETECTED Final   Shigella/Enteroinvasive E coli (EIEC) NOT DETECTED NOT DETECTED Final   Cryptosporidium NOT DETECTED NOT DETECTED Final   Cyclospora cayetanensis NOT DETECTED NOT DETECTED Final   Entamoeba histolytica NOT DETECTED NOT DETECTED Final   Giardia lamblia NOT DETECTED NOT DETECTED Final   Adenovirus F40/41 NOT DETECTED NOT DETECTED Final   Astrovirus NOT DETECTED NOT DETECTED Final   Norovirus GI/GII NOT DETECTED NOT DETECTED Final   Rotavirus A NOT DETECTED NOT DETECTED Final   Sapovirus (I, II, IV, and V) NOT DETECTED NOT DETECTED Final  Culture, blood (Routine X 2) w Reflex to ID Panel     Status: None (Preliminary result)   Collection Time: 08/24/15 12:20 PM  Result Value Ref Range Status   Specimen Description BLOOD LEFT HAND  Final   Special Requests BOTTLES DRAWN  AEROBIC ONLY 10CC  Final   Culture NO GROWTH 1 DAY  Final   Report Status PENDING  Incomplete  Culture, blood (Routine X 2) w Reflex to ID Panel     Status: None (  Preliminary result)   Collection Time: 08/24/15 12:40 PM  Result Value Ref Range Status   Specimen Description BLOOD LEFT HAND  Final   Special Requests IN PEDIATRIC BOTTLE 2.5CC  Final   Culture  Setup Time   Final    GRAM POSITIVE COCCI IN CLUSTERS AEROBIC BOTTLE ONLY CRITICAL RESULT CALLED TO, READ BACK BY AND VERIFIED WITH: JAMES LEDFORD,PHARMD @0113  08/26/15 MKELLY    Culture   Final    GRAM POSITIVE COCCI CULTURE REINCUBATED FOR BETTER GROWTH    Report Status PENDING  Incomplete  Blood Culture ID Panel (Reflexed)     Status: None   Collection Time: 08/24/15 12:40 PM  Result Value Ref Range Status   Enterococcus species NOT DETECTED NOT DETECTED Final   Vancomycin resistance NOT DETECTED NOT DETECTED Final   Listeria monocytogenes NOT DETECTED NOT DETECTED Final   Staphylococcus species NOT DETECTED NOT DETECTED Final   Staphylococcus aureus NOT DETECTED NOT DETECTED Final   Methicillin resistance NOT DETECTED NOT DETECTED Final   Streptococcus species NOT DETECTED NOT DETECTED Final   Streptococcus agalactiae NOT DETECTED NOT DETECTED Final   Streptococcus pneumoniae NOT DETECTED NOT DETECTED Final   Streptococcus pyogenes NOT DETECTED NOT DETECTED Final   Acinetobacter baumannii NOT DETECTED NOT DETECTED Final   Enterobacteriaceae species NOT DETECTED NOT DETECTED Final   Enterobacter cloacae complex NOT DETECTED NOT DETECTED Final   Escherichia coli NOT DETECTED NOT DETECTED Final   Klebsiella oxytoca NOT DETECTED NOT DETECTED Final   Klebsiella pneumoniae NOT DETECTED NOT DETECTED Final   Proteus species NOT DETECTED NOT DETECTED Final   Serratia marcescens NOT DETECTED NOT DETECTED Final   Carbapenem resistance NOT DETECTED NOT DETECTED Final   Haemophilus influenzae NOT DETECTED NOT DETECTED Final    Neisseria meningitidis NOT DETECTED NOT DETECTED Final   Pseudomonas aeruginosa NOT DETECTED NOT DETECTED Final   Candida albicans NOT DETECTED NOT DETECTED Final   Candida glabrata NOT DETECTED NOT DETECTED Final   Candida krusei NOT DETECTED NOT DETECTED Final   Candida parapsilosis NOT DETECTED NOT DETECTED Final   Candida tropicalis NOT DETECTED NOT DETECTED Final      Anti-infectives    Start     Dose/Rate Route Frequency Ordered Stop   08/26/15 0130  vancomycin (VANCOCIN) 1,500 mg in sodium chloride 0.9 % 500 mL IVPB     1,500 mg 250 mL/hr over 120 Minutes Intravenous Every 48 hours 08/26/15 0121     08/24/15 1200  cefTRIAXone (ROCEPHIN) 2 g in dextrose 5 % 50 mL IVPB     2 g 100 mL/hr over 30 Minutes Intravenous Every 24 hours 08/24/15 1055     08/20/15 0000  cefTRIAXone (ROCEPHIN) 2 g in dextrose 5 % 50 mL IVPB  Status:  Discontinued     2 g 100 mL/hr over 30 Minutes Intravenous Every 24 hours 08/19/15 1112 08/22/15 1310   08/18/15 1100  cefTAZidime (FORTAZ) 2 g in dextrose 5 % 50 mL IVPB  Status:  Discontinued     2 g 100 mL/hr over 30 Minutes Intravenous Every 24 hours 08/18/15 1039 08/19/15 1112   08/17/15 1800  vancomycin (VANCOCIN) 1,500 mg in sodium chloride 0.9 % 500 mL IVPB  Status:  Discontinued     1,500 mg 250 mL/hr over 120 Minutes Intravenous Every 48 hours 08/15/15 1636 08/15/15 1956   08/16/15 2100  ceFEPIme (MAXIPIME) 1 g in dextrose 5 % 50 mL IVPB  Status:  Discontinued     1 g 100  mL/hr over 30 Minutes Intravenous Every 24 hours 08/15/15 1957 08/16/15 1415   08/16/15 2100  ceFEPIme (MAXIPIME) 2 g in dextrose 5 % 50 mL IVPB  Status:  Discontinued     2 g 100 mL/hr over 30 Minutes Intravenous Every 24 hours 08/16/15 1415 08/16/15 1741   08/16/15 1900  piperacillin-tazobactam (ZOSYN) IVPB 2.25 g  Status:  Discontinued     2.25 g 100 mL/hr over 30 Minutes Intravenous Every 8 hours 08/16/15 1809 08/18/15 1014   08/15/15 2100  ceFEPIme (MAXIPIME) 2 g in  dextrose 5 % 50 mL IVPB     2 g 100 mL/hr over 30 Minutes Intravenous  Once 08/15/15 1957 08/15/15 2359   08/15/15 1730  vancomycin (VANCOCIN) 2,000 mg in sodium chloride 0.9 % 500 mL IVPB     2,000 mg 250 mL/hr over 120 Minutes Intravenous  Once 08/15/15 1636 08/15/15 2000   08/15/15 0900  piperacillin-tazobactam (ZOSYN) IVPB 3.375 g  Status:  Discontinued     3.375 g 12.5 mL/hr over 240 Minutes Intravenous Every 8 hours 08/15/15 0856 08/15/15 1956   08/15/15 0100  vancomycin (VANCOCIN) IVPB 1000 mg/200 mL premix     1,000 mg 200 mL/hr over 60 Minutes Intravenous STAT 08/15/15 0048 08/15/15 0206   08/14/15 2330  vancomycin (VANCOCIN) IVPB 1000 mg/200 mL premix     1,000 mg 200 mL/hr over 60 Minutes Intravenous STAT 08/14/15 2316 08/15/15 0041   08/14/15 2330  piperacillin-tazobactam (ZOSYN) IVPB 3.375 g     3.375 g 12.5 mL/hr over 240 Minutes Intravenous STAT 08/14/15 2317 08/15/15 0341       Radiology Studies: No results found.      Scheduled Meds: . amiodarone  400 mg Oral BID  . cefTRIAXone (ROCEPHIN)  IV  2 g Intravenous Q24H  . feeding supplement (ENSURE ENLIVE)  237 mL Oral TID BM  . feeding supplement (GLUCERNA SHAKE)  237 mL Oral TID BM  . furosemide  40 mg Oral Daily  . hydrALAZINE  75 mg Oral Q8H  . insulin aspart  0-15 Units Subcutaneous TID WC  . insulin glargine  32 Units Subcutaneous Daily  . isosorbide mononitrate  60 mg Oral Daily  . metoprolol tartrate  75 mg Oral BID  . pantoprazole  40 mg Oral Daily  . phytonadione (VITAMIN K) IV  5 mg Intravenous Once  . sodium chloride flush  3 mL Intravenous Q12H  . vancomycin  1,500 mg Intravenous Q48H  . Warfarin - Pharmacist Dosing Inpatient   Does not apply q1800   Continuous Infusions: . sodium chloride    . sodium chloride Stopped (08/21/15 1617)     LOS: 12 days    Time spent: 37 min    Natavia Sublette, MD [facility] Pager [phone]  If 7PM-7AM, please contact  night-coverage [email or web address] Password [email or web address] 08/26/2015, 10:45 AM

## 2015-08-26 NOTE — Progress Notes (Signed)
Dr. Jeanette Caprice came to pt bedside after paging, pt c/o of pressure and pain in penis, when trying to void, Dr. Jeanette Caprice irrigated foley cath, some dark appearing sediment came out, pt voided 450cc of urine once irrigated. Pt states feels better now.l

## 2015-08-26 NOTE — Progress Notes (Signed)
INR 6.36 - MD Elgergawy aware, also seems like foley is leaking, foley flushed with 10 cc, orders received. Will continue to monitor closely.

## 2015-08-26 NOTE — Progress Notes (Signed)
Dr. Meda Coffee contacted pt heart rate continues to stay in 150 to 160, awaiting orders. Will continue to monitor.

## 2015-08-26 NOTE — Progress Notes (Signed)
Progress Note   Subjective  Frank Hoover is a 65 year old African-American male who we consultative regarding hematochezia. Patient had a flexible sigmoidoscopy on 08/25/2015 which showed likely ischemic colitis with friable tissue.  Today, the patient is found during his patient care by nursing, he does have a moderate amount of bright red blood on the bed sheet, with some clots. The patient describes feeling overall "not very good". He is able to assist with this movement in the bed. He does admit to some continued generalized abdominal discomfort. He denies chest pain or shortness of breath. He denies any new complaints or concerns   Objective   Vital signs in last 24 hours: Temp:  [98.3 F (36.8 C)-99.1 F (37.3 C)] 98.5 F (36.9 C) (06/24 0752) Pulse Rate:  [63-116] 79 (06/24 0800) Resp:  [15-25] 15 (06/24 0800) BP: (67-179)/(29-98) 179/98 mmHg (06/24 0800) SpO2:  [96 %-100 %] 100 % (06/24 0800) Weight:  [261 lb 1.6 oz (118.434 kg)] 261 lb 1.6 oz (118.434 kg) (06/24 0500) Last BM Date: 08/25/15 General: African-American male in NAD Heart:  Regular rate and rhythm; no murmurs Lungs: Respirations even and unlabored, lungs CTA bilaterally Abdomen:  Soft, mild TTP in lower abdomen bilaterally and nondistended. Normal bowel sounds. Hematochezia present on bedsheet Extremities:  Without edema. Neurologic:  Alert and oriented,  grossly normal neurologically. Psych:  Cooperative. Normal mood and affect.  Intake/Output from previous day: 06/23 0701 - 06/24 0700 In: 500 [IV Piggyback:500] Out: 2150 [Urine:2150] Intake/Output this shift: Total I/O In: 10 [Other:10] Out: 0   Lab Results:  Recent Labs  08/25/15 0020 08/25/15 0423 08/26/15 0620  WBC 8.4 8.1 6.8  HGB 8.4* 8.4* 9.0*  HCT 26.2* 26.4* 28.9*  PLT 231 239 290   BMET  Recent Labs  08/24/15 0247 08/25/15 0423 08/26/15 0620  NA 135 136 136  K 3.1* 3.8 3.7  CL 102 106 105  CO2 25 25 22   GLUCOSE 161* 170*  191*  BUN 61* 47* 31*  CREATININE 2.73* 2.49* 2.06*  CALCIUM 8.0* 8.1* 8.5*   LFT  Recent Labs  08/25/15 0423  PROT 5.4*  ALBUMIN 1.8*  AST 38  ALT 41  ALKPHOS 57  BILITOT 0.1*  BILIDIR 0.1  IBILI 0.0*   PT/INR  Recent Labs  08/25/15 0423 08/26/15 0620  LABPROT 35.8* 53.9*  INR 3.69* 6.36*    Studies/Results: No results found.     Assessment / Plan:   Assessment: 1. Hematochezia: Continues this morning, though hemoglobin has stabilized and increased to 9 from 8.4 yesterday. Flexible sigmoidoscopy showed likely ischemic colitis, though biopsies are pending. 2. Anemia: Related to above, now improving. 3. A flutter and setting of urosepsis  Plan: 1. Continue to monitor hemoglobin with transfusion as needed 2. From GI perspective patient could start clear liquid diet. 3. No further recommendations at this time 4. Await results of biopsies taken during flexible sigmoidoscopy on 08/25/2015 5. Will discuss above with Dr. Hilarie Fredrickson  Thank you for your kind consultation, we will continue to follow  Principal Problem:   Atrial flutter with rapid ventricular response (HCC) Active Problems:   GERD   DM (diabetes mellitus) ([place])   HTN (hypertension)   Hyperlipemia   Sleep apnea   Systolic CHF ([place])   Acute respiratory failure (HCC)   Septic shock (HCC)   Acute encephalopathy   Acute kidney injury ([place])   Acute systolic heart failure (HCC)   SVT (supraventricular tachycardia) ([place])   Debility  Blood in stool   Acute blood loss anemia   Abnormal CT scan, colon     LOS: 12 days   Levin Erp  08/26/2015, 9:47 AM  Pager # [phone]

## 2015-08-26 NOTE — Progress Notes (Signed)
PHARMACY - PHYSICIAN COMMUNICATION CRITICAL VALUE ALERT - BLOOD CULTURE IDENTIFICATION (BCID)  1/2 bottles with gram + cocci in clusters  Results for orders placed or performed during the hospital encounter of 08/14/15  Blood Culture ID Panel (Reflexed) (Collected: 08/24/2015 12:40 PM)  Result Value Ref Range   Enterococcus species NOT DETECTED NOT DETECTED   Vancomycin resistance NOT DETECTED NOT DETECTED   Listeria monocytogenes NOT DETECTED NOT DETECTED   Staphylococcus species NOT DETECTED NOT DETECTED   Staphylococcus aureus NOT DETECTED NOT DETECTED   Methicillin resistance NOT DETECTED NOT DETECTED   Streptococcus species NOT DETECTED NOT DETECTED   Streptococcus agalactiae NOT DETECTED NOT DETECTED   Streptococcus pneumoniae NOT DETECTED NOT DETECTED   Streptococcus pyogenes NOT DETECTED NOT DETECTED   Acinetobacter baumannii NOT DETECTED NOT DETECTED   Enterobacteriaceae species NOT DETECTED NOT DETECTED   Enterobacter cloacae complex NOT DETECTED NOT DETECTED   Escherichia coli NOT DETECTED NOT DETECTED   Klebsiella oxytoca NOT DETECTED NOT DETECTED   Klebsiella pneumoniae NOT DETECTED NOT DETECTED   Proteus species NOT DETECTED NOT DETECTED   Serratia marcescens NOT DETECTED NOT DETECTED   Carbapenem resistance NOT DETECTED NOT DETECTED   Haemophilus influenzae NOT DETECTED NOT DETECTED   Neisseria meningitidis NOT DETECTED NOT DETECTED   Pseudomonas aeruginosa NOT DETECTED NOT DETECTED   Candida albicans NOT DETECTED NOT DETECTED   Candida glabrata NOT DETECTED NOT DETECTED   Candida krusei NOT DETECTED NOT DETECTED   Candida parapsilosis NOT DETECTED NOT DETECTED   Candida tropicalis NOT DETECTED NOT DETECTED    Name of physician (or Provider) Contacted: Kathline Magic, NP  Changes to prescribed antibiotics required: Add vancomycin until speciation   Plan -Vancomycin 1500 mg IV q48h, will need dose increase if renal function improves  Narda Bonds 08/26/2015   1:17 AM

## 2015-08-26 NOTE — Progress Notes (Signed)
Md notified for clarification on amio po and gtt.  Per md will continue and give 2200 dose of amio and keep pt on amio iv as well.  Will continue to monitor.Saunders Revel T

## 2015-08-27 ENCOUNTER — Encounter (HOSPITAL_COMMUNITY): Payer: Self-pay | Admitting: Internal Medicine

## 2015-08-27 DIAGNOSIS — D62 Acute posthemorrhagic anemia: Secondary | ICD-10-CM

## 2015-08-27 LAB — CBC
HCT: 24.4 % — ABNORMAL LOW (ref 39.0–52.0)
Hemoglobin: 7.5 g/dL — ABNORMAL LOW (ref 13.0–17.0)
MCH: 27.6 pg (ref 26.0–34.0)
MCHC: 30.7 g/dL (ref 30.0–36.0)
MCV: 89.7 fL (ref 78.0–100.0)
Platelets: 292 10*3/uL (ref 150–400)
RBC: 2.72 MIL/uL — ABNORMAL LOW (ref 4.22–5.81)
RDW: 15.5 % (ref 11.5–15.5)
WBC: 5 10*3/uL (ref 4.0–10.5)

## 2015-08-27 LAB — GLUCOSE, CAPILLARY
Glucose-Capillary: 159 mg/dL — ABNORMAL HIGH (ref 65–99)
Glucose-Capillary: 250 mg/dL — ABNORMAL HIGH (ref 65–99)
Glucose-Capillary: 130 mg/dL — ABNORMAL HIGH (ref 65–99)
Glucose-Capillary: 137 mg/dL — ABNORMAL HIGH (ref 65–99)

## 2015-08-27 LAB — PROTIME-INR
INR: 1.34 (ref 0.00–1.49)
Prothrombin Time: 16.7 seconds — ABNORMAL HIGH (ref 11.6–15.2)

## 2015-08-27 LAB — BASIC METABOLIC PANEL
Anion gap: 7 (ref 5–15)
BUN: 23 mg/dL — ABNORMAL HIGH (ref 6–20)
CO2: 24 mmol/L (ref 22–32)
Calcium: 8 mg/dL — ABNORMAL LOW (ref 8.9–10.3)
Chloride: 104 mmol/L (ref 101–111)
Creatinine, Ser: 1.91 mg/dL — ABNORMAL HIGH (ref 0.61–1.24)
GFR calc Af Amer: 41 mL/min — ABNORMAL LOW (ref >60)
GFR calc non Af Amer: 36 mL/min — ABNORMAL LOW (ref >60)
Glucose, Bld: 154 mg/dL — ABNORMAL HIGH (ref 65–99)
Potassium: 3.2 mmol/L — ABNORMAL LOW (ref 3.5–5.1)
Sodium: 135 mmol/L (ref 135–145)

## 2015-08-27 LAB — HEPARIN LEVEL (UNFRACTIONATED): Heparin Unfractionated: <0.1 IU/mL — ABNORMAL LOW (ref 0.30–0.70)

## 2015-08-27 LAB — PREPARE RBC (CROSSMATCH)

## 2015-08-27 MED ORDER — FUROSEMIDE 10 MG/ML IJ SOLN
20.0000 mg | Freq: Once | INTRAMUSCULAR | Status: AC
  Administered 2015-08-27: 20 mg via INTRAVENOUS
  Filled 2015-08-27: qty 2

## 2015-08-27 MED ORDER — FUROSEMIDE 10 MG/ML IJ SOLN
40.0000 mg | Freq: Once | INTRAMUSCULAR | Status: AC
  Administered 2015-08-27: 40 mg via INTRAVENOUS
  Filled 2015-08-27: qty 4

## 2015-08-27 MED ORDER — VANCOMYCIN HCL 10 G IV SOLR
1250.0000 mg | INTRAVENOUS | Status: DC
  Administered 2015-08-27 – 2015-08-28 (×2): 1250 mg via INTRAVENOUS
  Filled 2015-08-27 (×2): qty 1250

## 2015-08-27 MED ORDER — SODIUM CHLORIDE 0.9 % IV SOLN
Freq: Once | INTRAVENOUS | Status: AC
  Administered 2015-08-27: 08:00:00 via INTRAVENOUS

## 2015-08-27 MED ORDER — ACYCLOVIR 5 % EX OINT
TOPICAL_OINTMENT | Freq: Every day | CUTANEOUS | Status: DC
  Administered 2015-08-27: 10:00:00 via TOPICAL
  Administered 2015-08-27 (×3): 1 via TOPICAL
  Administered 2015-08-27: 13:00:00 via TOPICAL
  Administered 2015-08-28: 1 via TOPICAL
  Administered 2015-08-28 – 2015-08-29 (×6): via TOPICAL
  Administered 2015-08-29: 1 via TOPICAL
  Administered 2015-08-29 – 2015-08-30 (×5): via TOPICAL
  Administered 2015-08-30 (×3): 1 via TOPICAL
  Administered 2015-08-31 – 2015-09-03 (×18): via TOPICAL
  Administered 2015-09-03 (×2): 1 via TOPICAL
  Administered 2015-09-03 – 2015-09-05 (×4): via TOPICAL
  Filled 2015-08-27 (×2): qty 15

## 2015-08-27 MED ORDER — DILTIAZEM HCL ER COATED BEADS 120 MG PO CP24
120.0000 mg | ORAL_CAPSULE | Freq: Every day | ORAL | Status: DC
  Administered 2015-08-27 – 2015-08-29 (×3): 120 mg via ORAL
  Filled 2015-08-27 (×3): qty 1

## 2015-08-27 MED ORDER — POTASSIUM CHLORIDE CRYS ER 20 MEQ PO TBCR
30.0000 meq | EXTENDED_RELEASE_TABLET | Freq: Four times a day (QID) | ORAL | Status: AC
  Administered 2015-08-27 (×2): 30 meq via ORAL
  Filled 2015-08-27 (×2): qty 1

## 2015-08-27 MED ORDER — WARFARIN SODIUM 2 MG PO TABS: 1.0000 mg | ORAL_TABLET | Freq: Once | ORAL | Status: DC

## 2015-08-27 MED ORDER — POTASSIUM CHLORIDE 10 MEQ/100ML IV SOLN
10.0000 meq | INTRAVENOUS | Status: AC
  Administered 2015-08-27 (×2): 10 meq via INTRAVENOUS
  Filled 2015-08-27 (×2): qty 100

## 2015-08-27 MED ORDER — ACYCLOVIR 5 % EX OINT: TOPICAL_OINTMENT | CUTANEOUS | Status: DC

## 2015-08-27 MED ORDER — HEPARIN (PORCINE) IN NACL 100-0.45 UNIT/ML-% IJ SOLN
900.0000 [IU]/h | INTRAMUSCULAR | Status: DC
  Administered 2015-08-28: 900 [IU]/h via INTRAVENOUS
  Filled 2015-08-27: qty 250

## 2015-08-27 NOTE — Progress Notes (Addendum)
Patient: Frank Hoover / Admit Date: 08/14/2015 / Date of Encounter: 08/27/2015, 10:04 AM  Subjective: No chest pain or SOB. He is out of bed for the first time since the admission.  He went back to atrial flutter with RVR yesterday around noon.  Objective: Telemetry: NSR 70s Physical Exam: Blood pressure 163/93, pulse 124, temperature 99.2 F (37.3 C), temperature source Oral, resp. rate 23, height 5' 6"  (1.676 m), weight 258 lb 2.5 oz (117.1 kg), SpO2 99 %. General: Well developed obese AAM in no acute distress. Head: Normocephalic, atraumatic, sclera non-icteric, no xanthomas, nares are without discharge. Divergence of eyes noted. Neck: Negative for carotid bruits. JVP not elevated. Lungs: Clear bilaterally to auscultation without wheezes, rales, or rhonchi. Breathing is unlabored. Heart: RRR S1 S2 without murmurs, rubs, or gallops.  Abdomen: Soft, non-tender, non-distended with normoactive bowel sounds. No rebound/guarding. Extremities: No clubbing or cyanosis. trace pedal edema. Distal pedal pulses are 2+ and equal bilaterally. Neuro: Alert and oriented X 3. Moves all extremities spontaneously. Psych:  Responds to questions appropriately with a flat affect.  Intake/Output Summary (Last 24 hours) at 08/27/15 1004 Last data filed at 08/27/15 0800  Gross per 24 hour  Intake 1970.39 ml  Output   1995 ml  Net -24.61 ml   Inpatient Medications:  . sodium chloride   Intravenous Once  . acyclovir ointment   Topical 6 X Daily  . amiodarone  400 mg Oral BID  . cefTRIAXone (ROCEPHIN)  IV  2 g Intravenous Q24H  . feeding supplement (ENSURE ENLIVE)  237 mL Oral TID BM  . feeding supplement (GLUCERNA SHAKE)  237 mL Oral TID BM  . furosemide  20 mg Intravenous Once  . furosemide  40 mg Oral Daily  . hydrALAZINE  75 mg Oral Q8H  . insulin aspart  0-15 Units Subcutaneous TID WC  . insulin glargine  32 Units Subcutaneous Daily  . isosorbide mononitrate  60 mg Oral Daily  . metoprolol  tartrate  75 mg Oral BID  . oxybutynin  5 mg Oral TID  . pantoprazole  40 mg Oral Daily  . sodium chloride flush  3 mL Intravenous Q12H  . vancomycin  1,250 mg Intravenous Q24H  . warfarin  1 mg Oral ONCE-1800  . Warfarin - Pharmacist Dosing Inpatient   Does not apply q1800   Infusions:  . sodium chloride 250 mL (08/27/15 0517)  . sodium chloride Stopped (08/21/15 1617)  . amiodarone 30 mg/hr (08/27/15 0332)   Labs:  Recent Labs  08/26/15 0620 08/27/15 0326  NA 136 135  K 3.7 3.2*  CL 105 104  CO2 22 24  GLUCOSE 191* 154*  BUN 31* 23*  CREATININE 2.06* 1.91*  CALCIUM 8.5* 8.0*    Recent Labs  08/25/15 0423  AST 38  ALT 41  ALKPHOS 57  BILITOT 0.1*  PROT 5.4*  ALBUMIN 1.8*    Recent Labs  08/26/15 1352 08/27/15 0326  WBC 6.3 5.0  HGB 8.6* 7.5*  HCT 27.3* 24.4*  MCV 87.2 89.7  PLT 298 292   Telemetry: Personally reviewed - SR in 70", the last night at 11 pm there was an episode of a-fib with RVR with HR 160 BPM lasting < 1 hour    Assessment and Plan   89M with history of CKD stage III, COPD, HTN, gout, DM admitted after presenting for an echocardiogram ordered by his nephrologist secondary to elevated heart rate and leg edema. He then was referred to the ER  after call placed to Dr. Lowanda Foster reporting patient heart rate in the 120s and associated dizziness. He was found to be in newly recognized atrial flutter RVR of unknown duration, acute cardiogenic/septic shock requiring pressors & IV abx (citrobacter bacteremia), acute respiratory failure requiring ventilation, EF 25%. Ischemic workup not yet pursued due to AKI on CKD with Cr peak over 5. Elevated flat troponin 6/12-6/14. Atrial flutter RVR persisted despite amiodarone loading thus underwent TEE/DCCV 08/22/15 with successful conversion to NSR. On (08/23/15) was noted to go into rapid SVT HR 170-180s with associated chest discomfort and fall in BP. Responded once to carotid massage and a second time to IV  adenosine.   1. Acute cardiogenic/septic shock and acute respiratory failure - off pressors, off vent, BP stable   2. Acute systolic CHF - continues to diurese well on po lasix. Dose reduced to 40 mg daily in the last two days, I&O -1.7 L overnight.  Weight down 11 lbs since admit. Not on ACEI/ARB/spiro due to ARF. Appears to be  euvolemia.  Increase metoprolol to 75 mg po BID. Titrate oral hydralazine and nitrates since BP elevated. Repeat Echo in 2 months to reassess EF.   3. Atrial flutter RVR - s/p TEE/DCCV 08/22/15. Now back in atrial flutter since yesterday, ventricular rate 150-160, now down to 130 BPM on iv amiodarone and oral metoprolol. He is hypertensive, I will add cardizem CD 120 mg po daily. - INR today 1.34, I will restart heparin - if he doesn't convert on his own, consider another DCCV once he is loaded with amiodarone, possibly Tuesday  4. AKI on CKD stage III - Cr continues to gradually improve. Avoid contrast studies.  5. Progressive anemia - ? Due to critical illness/CKD. Stool heme positive. ? Ischemic colitis secondary to shock. Possible flex sig per GI.   6. Hypokalemia- repleted  7. HTN will titrate hydralazine/nitrates for BP.  Signed,  Ena Dawley, [place] 08/27/2015 10:04 AM  Addendum:  I was called that the patient's Hb dropped from 8.6--7.5, he also had a melanotic stool. We will hold heparin and coumadin for now, he is getting 2 units of PRBC now. We will follow closely.  Ena Dawley 08/27/2015

## 2015-08-27 NOTE — Progress Notes (Signed)
ANTICOAGULATION CONSULT NOTE  Heparin level undetectable >> erroneous  Heparin was never started today due to hgb and melena Will dc all labs for now F/u plan for heparin in AM   Hughes Better, PharmD, BCPS Clinical Pharmacist 08/27/2015 7:08 PM

## 2015-08-27 NOTE — Progress Notes (Signed)
ANTIBIOTIC NOTE   Pharmacy Re: Vancomycin Indication:  Gm. Positive Cocci in blood culture  No Known Allergies  Patient Measurements: Height: 5\' 6"  (167.6 cm) Weight: 258 lb 2.5 oz (117.1 kg) IBW/kg (Calculated) : 63.8  Vital Signs: Temp: 99.2 F (37.3 C) (06/25 0802) Temp Source: Oral (06/25 0802) BP: 163/93 mmHg (06/25 0802) Pulse Rate: 124 (06/25 0802) Intake/Output from previous day: 06/24 0701 - 06/25 0700 In: 1740.4 [P.O.:1070; I.V.:310.4; IV Piggyback:350] Out: [id number] [Urine:1745]  Labs:  Recent Labs  08/25/15 0423 08/26/15 0620 08/26/15 1352 08/27/15 0326  WBC 8.1 6.8 6.3 5.0  HGB 8.4* 9.0* 8.6* 7.5*  PLT 239 290 298 292  CREATININE 2.49* 2.06*  --  1.91*   Estimated Creatinine Clearance: 47.6 mL/min (by C-G formula based on Cr of 1.91).  Microbiology: Recent Results (from the past 720 hour(s))  MRSA PCR Screening     Status: None   Collection Time: 08/14/15  9:25 PM  Result Value Ref Range Status   MRSA by PCR NEGATIVE NEGATIVE Final    Comment:        The GeneXpert MRSA Assay (FDA approved for NASAL specimens only), is one component of a comprehensive MRSA colonization surveillance program. It is not intended to diagnose MRSA infection nor to guide or monitor treatment for MRSA infections.   Culture, Urine     Status: Abnormal   Collection Time: 08/14/15  9:25 PM  Result Value Ref Range Status   Specimen Description URINE, CLEAN CATCH  Final   Special Requests NONE  Final   Culture >=100,000 COLONIES/mL CITROBACTER KOSERI (A)  Final   Report Status 08/17/2015 FINAL  Final   Organism ID, Bacteria CITROBACTER KOSERI (A)  Final      Susceptibility   Citrobacter koseri - MIC*    CEFAZOLIN <=4 SENSITIVE Sensitive     CEFTRIAXONE <=1 SENSITIVE Sensitive     CIPROFLOXACIN <=0.25 SENSITIVE Sensitive     GENTAMICIN <=1 SENSITIVE Sensitive     IMIPENEM <=0.25 SENSITIVE Sensitive     NITROFURANTOIN <=16 SENSITIVE Sensitive     TRIMETH/SULFA <=20  SENSITIVE Sensitive     * >=100,000 COLONIES/mL CITROBACTER KOSERI  Culture, blood (routine x 2)     Status: Abnormal   Collection Time: 08/14/15 11:30 PM  Result Value Ref Range Status   Specimen Description BLOOD RIGHT HAND  Final   Special Requests BOTTLES DRAWN AEROBIC AND ANAEROBIC 4CC EACH  Final   Culture  Setup Time   Final    GRAM NEGATIVE RODS RECOVERED FROM THE AEROBIC BOTTLE Gram Stain Report Called to,Read Back By and Verified With: [place]. AT 1406 ON 08/15/2015 BY BAUGHAM,M. Performed at [facility] Organism ID to follow CRITICAL RESULT CALLED TO, READ BACK BY AND VERIFIED WITH: A MEYER PHARMD 1935 08/15/15 A BROWNING GRAM NEGATIVE RODS RECOVERED FROM THE ANAEROBIC BOTTLE  AT [facility]  GRAM STAIN RESULTS PREVIOUSLY CALLED BY [facility]    Culture CITROBACTER KOSERI (A)  Final   Report Status 08/19/2015 FINAL  Final   Organism ID, Bacteria CITROBACTER KOSERI  Final      Susceptibility   Citrobacter koseri - MIC*    CEFAZOLIN <=4 SENSITIVE Sensitive     CEFEPIME <=1 SENSITIVE Sensitive     CEFTAZIDIME <=1 SENSITIVE Sensitive     CEFTRIAXONE <=1 SENSITIVE Sensitive     CIPROFLOXACIN <=0.25 SENSITIVE Sensitive     GENTAMICIN <=1 SENSITIVE Sensitive     IMIPENEM <=0.25 SENSITIVE Sensitive     TRIMETH/SULFA <=20 SENSITIVE Sensitive  PIP/TAZO <=4 SENSITIVE Sensitive     * CITROBACTER KOSERI  Blood Culture ID Panel (Reflexed)     Status: Abnormal   Collection Time: 08/14/15 11:30 PM  Result Value Ref Range Status   Enterococcus species NOT DETECTED NOT DETECTED Final   Vancomycin resistance NOT DETECTED NOT DETECTED Final   Listeria monocytogenes NOT DETECTED NOT DETECTED Final   Staphylococcus species NOT DETECTED NOT DETECTED Final   Staphylococcus aureus NOT DETECTED NOT DETECTED Final   Methicillin resistance NOT DETECTED NOT DETECTED Final   Streptococcus species NOT DETECTED NOT DETECTED Final   Streptococcus agalactiae NOT DETECTED NOT DETECTED Final    Streptococcus pneumoniae NOT DETECTED NOT DETECTED Final   Streptococcus pyogenes NOT DETECTED NOT DETECTED Final   Acinetobacter baumannii NOT DETECTED NOT DETECTED Final   Enterobacteriaceae species DETECTED (A) NOT DETECTED Final    Comment: CRITICAL RESULT CALLED TO, READ BACK BY AND VERIFIED WITH: A MEYER PHARMD 1935 08/15/15 A BROWNING    Enterobacter cloacae complex NOT DETECTED NOT DETECTED Final   Escherichia coli NOT DETECTED NOT DETECTED Final   Klebsiella oxytoca NOT DETECTED NOT DETECTED Final   Klebsiella pneumoniae NOT DETECTED NOT DETECTED Final   Proteus species NOT DETECTED NOT DETECTED Final   Serratia marcescens NOT DETECTED NOT DETECTED Final   Carbapenem resistance NOT DETECTED NOT DETECTED Final   Haemophilus influenzae NOT DETECTED NOT DETECTED Final   Neisseria meningitidis NOT DETECTED NOT DETECTED Final   Pseudomonas aeruginosa NOT DETECTED NOT DETECTED Final   Candida albicans NOT DETECTED NOT DETECTED Final   Candida glabrata NOT DETECTED NOT DETECTED Final   Candida krusei NOT DETECTED NOT DETECTED Final   Candida parapsilosis NOT DETECTED NOT DETECTED Final   Candida tropicalis NOT DETECTED NOT DETECTED Final    Comment: Performed at [facility]  Culture, blood (routine x 2)     Status: None   Collection Time: 08/14/15 11:45 PM  Result Value Ref Range Status   Specimen Description BLOOD LEFT HAND  Final   Special Requests BOTTLES DRAWN AEROBIC AND ANAEROBIC 3CC EACH  Final   Culture NO GROWTH 5 DAYS  Final   Report Status 08/19/2015 FINAL  Final  C difficile quick scan w PCR reflex     Status: None   Collection Time: 08/23/15  4:14 PM  Result Value Ref Range Status   C Diff antigen NEGATIVE NEGATIVE Final   C Diff toxin NEGATIVE NEGATIVE Final   C Diff interpretation Negative for toxigenic C. difficile  Final  Gastrointestinal Panel by PCR , Stool     Status: None   Collection Time: 08/24/15 11:29 AM  Result Value Ref Range Status    Campylobacter species NOT DETECTED NOT DETECTED Final   Plesimonas shigelloides NOT DETECTED NOT DETECTED Final   Salmonella species NOT DETECTED NOT DETECTED Final   Yersinia enterocolitica NOT DETECTED NOT DETECTED Final   Vibrio species NOT DETECTED NOT DETECTED Final   Vibrio cholerae NOT DETECTED NOT DETECTED Final   Enteroaggregative E coli (EAEC) NOT DETECTED NOT DETECTED Final   Enteropathogenic E coli (EPEC) NOT DETECTED NOT DETECTED Final   Enterotoxigenic E coli (ETEC) NOT DETECTED NOT DETECTED Final   Shiga like toxin producing E coli (STEC) NOT DETECTED NOT DETECTED Final   E. coli O157 NOT DETECTED NOT DETECTED Final   Shigella/Enteroinvasive E coli (EIEC) NOT DETECTED NOT DETECTED Final   Cryptosporidium NOT DETECTED NOT DETECTED Final   Cyclospora cayetanensis NOT DETECTED NOT DETECTED Final  Entamoeba histolytica NOT DETECTED NOT DETECTED Final   Giardia lamblia NOT DETECTED NOT DETECTED Final   Adenovirus F40/41 NOT DETECTED NOT DETECTED Final   Astrovirus NOT DETECTED NOT DETECTED Final   Norovirus GI/GII NOT DETECTED NOT DETECTED Final   Rotavirus A NOT DETECTED NOT DETECTED Final   Sapovirus (I, II, IV, and V) NOT DETECTED NOT DETECTED Final  Culture, blood (Routine X 2) w Reflex to ID Panel     Status: None (Preliminary result)   Collection Time: 08/24/15 12:20 PM  Result Value Ref Range Status   Specimen Description BLOOD LEFT HAND  Final   Special Requests BOTTLES DRAWN AEROBIC ONLY 10CC  Final   Culture NO GROWTH 2 DAYS  Final   Report Status PENDING  Incomplete  Culture, blood (Routine X 2) w Reflex to ID Panel     Status: None (Preliminary result)   Collection Time: 08/24/15 12:40 PM  Result Value Ref Range Status   Specimen Description BLOOD LEFT HAND  Final   Special Requests IN PEDIATRIC BOTTLE 2.5CC  Final   Culture  Setup Time   Final    GRAM POSITIVE COCCI IN CLUSTERS AEROBIC BOTTLE ONLY CRITICAL RESULT CALLED TO, READ BACK BY AND VERIFIED WITH:  JAMES LEDFORD,PHARMD @0113  08/26/15 MKELLY    Culture GRAM POSITIVE COCCI IDENTIFICATION TO FOLLOW   Final   Report Status PENDING  Incomplete  Blood Culture ID Panel (Reflexed)     Status: None   Collection Time: 08/24/15 12:40 PM  Result Value Ref Range Status   Enterococcus species NOT DETECTED NOT DETECTED Final   Vancomycin resistance NOT DETECTED NOT DETECTED Final   Listeria monocytogenes NOT DETECTED NOT DETECTED Final   Staphylococcus species NOT DETECTED NOT DETECTED Final   Staphylococcus aureus NOT DETECTED NOT DETECTED Final   Methicillin resistance NOT DETECTED NOT DETECTED Final   Streptococcus species NOT DETECTED NOT DETECTED Final   Streptococcus agalactiae NOT DETECTED NOT DETECTED Final   Streptococcus pneumoniae NOT DETECTED NOT DETECTED Final   Streptococcus pyogenes NOT DETECTED NOT DETECTED Final   Acinetobacter baumannii NOT DETECTED NOT DETECTED Final   Enterobacteriaceae species NOT DETECTED NOT DETECTED Final   Enterobacter cloacae complex NOT DETECTED NOT DETECTED Final   Escherichia coli NOT DETECTED NOT DETECTED Final   Klebsiella oxytoca NOT DETECTED NOT DETECTED Final   Klebsiella pneumoniae NOT DETECTED NOT DETECTED Final   Proteus species NOT DETECTED NOT DETECTED Final   Serratia marcescens NOT DETECTED NOT DETECTED Final   Carbapenem resistance NOT DETECTED NOT DETECTED Final   Haemophilus influenzae NOT DETECTED NOT DETECTED Final   Neisseria meningitidis NOT DETECTED NOT DETECTED Final   Pseudomonas aeruginosa NOT DETECTED NOT DETECTED Final   Candida albicans NOT DETECTED NOT DETECTED Final   Candida glabrata NOT DETECTED NOT DETECTED Final   Candida krusei NOT DETECTED NOT DETECTED Final   Candida parapsilosis NOT DETECTED NOT DETECTED Final   Candida tropicalis NOT DETECTED NOT DETECTED Final   Medical History: Past Medical History  Diagnosis Date  . Gout   . COPD (chronic obstructive pulmonary disease) ([place])   . Type 2 diabetes  mellitus ([place])   . GERD (gastroesophageal reflux disease)   . Essential hypertension   . Hyperlipemia   . OA (osteoarthritis)   . Gastroparesis   . Adenomatous colon polyp 10/30/09    Serrated adenoma removed during Colonoscopy   . Diverticulosis of colon   . CKD (chronic kidney disease) stage 3, GFR 30-59 ml/min  Medications:  Anti-infectives    Start     Dose/Rate Route Frequency Ordered Stop   08/27/15 1100  vancomycin (VANCOCIN) 1,250 mg in sodium chloride 0.9 % 250 mL IVPB     1,250 mg 166.7 mL/hr over 90 Minutes Intravenous Every 24 hours 08/27/15 0842     08/26/15 0130  vancomycin (VANCOCIN) 1,500 mg in sodium chloride 0.9 % 500 mL IVPB  Status:  Discontinued     1,500 mg 250 mL/hr over 120 Minutes Intravenous Every 48 hours 08/26/15 0121 08/27/15 0841   08/24/15 1200  cefTRIAXone (ROCEPHIN) 2 g in dextrose 5 % 50 mL IVPB     2 g 100 mL/hr over 30 Minutes Intravenous Every 24 hours 08/24/15 1055     08/20/15 0000  cefTRIAXone (ROCEPHIN) 2 g in dextrose 5 % 50 mL IVPB  Status:  Discontinued     2 g 100 mL/hr over 30 Minutes Intravenous Every 24 hours 08/19/15 1112 08/22/15 1310   08/18/15 1100  cefTAZidime (FORTAZ) 2 g in dextrose 5 % 50 mL IVPB  Status:  Discontinued     2 g 100 mL/hr over 30 Minutes Intravenous Every 24 hours 08/18/15 1039 08/19/15 1112   08/17/15 1800  vancomycin (VANCOCIN) 1,500 mg in sodium chloride 0.9 % 500 mL IVPB  Status:  Discontinued     1,500 mg 250 mL/hr over 120 Minutes Intravenous Every 48 hours 08/15/15 1636 08/15/15 1956   08/16/15 2100  ceFEPIme (MAXIPIME) 1 g in dextrose 5 % 50 mL IVPB  Status:  Discontinued     1 g 100 mL/hr over 30 Minutes Intravenous Every 24 hours 08/15/15 1957 08/16/15 1415   08/16/15 2100  ceFEPIme (MAXIPIME) 2 g in dextrose 5 % 50 mL IVPB  Status:  Discontinued     2 g 100 mL/hr over 30 Minutes Intravenous Every 24 hours 08/16/15 1415 08/16/15 1741   08/16/15 1900  piperacillin-tazobactam (ZOSYN) IVPB 2.25 g   Status:  Discontinued     2.25 g 100 mL/hr over 30 Minutes Intravenous Every 8 hours 08/16/15 1809 08/18/15 1014   08/15/15 2100  ceFEPIme (MAXIPIME) 2 g in dextrose 5 % 50 mL IVPB     2 g 100 mL/hr over 30 Minutes Intravenous  Once 08/15/15 1957 08/15/15 2359   08/15/15 1730  vancomycin (VANCOCIN) 2,000 mg in sodium chloride 0.9 % 500 mL IVPB     2,000 mg 250 mL/hr over 120 Minutes Intravenous  Once 08/15/15 1636 08/15/15 2000   08/15/15 0900  piperacillin-tazobactam (ZOSYN) IVPB 3.375 g  Status:  Discontinued     3.375 g 12.5 mL/hr over 240 Minutes Intravenous Every 8 hours 08/15/15 0856 08/15/15 1956   08/15/15 0100  vancomycin (VANCOCIN) IVPB 1000 mg/200 mL premix     1,000 mg 200 mL/hr over 60 Minutes Intravenous STAT 08/15/15 0048 08/15/15 0206   08/14/15 2330  vancomycin (VANCOCIN) IVPB 1000 mg/200 mL premix     1,000 mg 200 mL/hr over 60 Minutes Intravenous STAT 08/14/15 2316 08/15/15 0041   08/14/15 2330  piperacillin-tazobactam (ZOSYN) IVPB 3.375 g     3.375 g 12.5 mL/hr over 240 Minutes Intravenous STAT 08/14/15 2317 08/15/15 0341     Assessment: 64yo with UTI/bacteremia and has received multiple antibiotics including IV Vancomycin which was restarted on 6/24 for gm. positive cocci cultures until sensitivities are returned.  His renal function has improved with an estimated crcl of 88ml/min.  Will adjust his dose given improved renal function.   Plan:  Change  Vancomycin to 1250mg  IV every 24 hours - adjust as crcl improves.  Rober Minion, PharmD., MS Clinical Pharmacist Pager:  [phone] Thank you for allowing pharmacy to be part of this patients care team. 08/27/2015,8:43 AM

## 2015-08-27 NOTE — Progress Notes (Addendum)
ANTICOAGULATION CONSULT NOTE - Follow Up Consult  Pharmacy Consult for Heparin, coumadin  Indication: atrial fibrillation   Addendum:  Adding Heparin to regimen - will start without a bolus with low H/H this AM and potential for increased bleeding.  Will use 10 units/kg ABW of 94kg to start at 900 units/hr.  Will check a HL in 8 hours as well as CBC to ensure appropriate response.  Rober Minion, PharmD., MS Clinical Pharmacist Pager:  [phone]   No Known Allergies  Patient Measurements: Height: 5\' 6"  (167.6 cm) Weight: 258 lb 2.5 oz (117.1 kg) IBW/kg (Calculated) : 63.8 Heparin Dosing Weight: 94 kg  Vital Signs: Temp: 98.9 F (37.2 C) (06/25 0312) Temp Source: Oral (06/25 0312) BP: 161/98 mmHg (06/25 0517) Pulse Rate: 123 (06/25 0312)  Labs:  Recent Labs  08/25/15 0423 08/26/15 0620 08/26/15 1352 08/27/15 0326  HGB 8.4* 9.0* 8.6* 7.5*  HCT 26.4* 28.9* 27.3* 24.4*  PLT 239 290 298 292  LABPROT 35.8* 53.9*  --  16.7*  INR 3.69* 6.36*  --  1.34  HEPARINUNFRC <0.10*  --   --   --   CREATININE 2.49* 2.06*  --  1.91*    Estimated Creatinine Clearance: 47.6 mL/min (by C-G formula based on Cr of 1.91).  . sodium chloride 250 mL (08/27/15 0517)  . sodium chloride Stopped (08/21/15 1617)  . amiodarone 30 mg/hr (08/27/15 0332)   Assessment: 71 YOM admitted 08/14/2015 with newly diagnosed AFlutter with RVR (s/p DCCV on 6/20). Pharmacy consulted to dose coumadin. INR up to 6.36 and Vitamin K 5mg  IV given x 1 (6/24).  Today his INR is 1.34.  His H/H has trended downward still at 7.5 today.  Spoke with RN who received report that he has had some blood tinged mucus.  MD aware and is monitoring, desires the lower end of goal for his Warfarin therapy.  Will titrate him up slowly given decreased H/H and risk for bleeding.  Goal of Therapy:  INR 2.0-2.5 Monitor platelets by anticoagulation protocol: Yes   Plan:  - Warfarin 1 mg x 1 today - Daily PT/INR  Rober Minion,  PharmD., MS Clinical Pharmacist Pager:  [phone] Thank you for allowing pharmacy to be part of this patients care team. 08/27/2015 8:00 AM

## 2015-08-27 NOTE — Clinical Social Work Note (Signed)
Clinical Social Work Assessment  Patient Details  Name: Frank Hoover MRN: [id number] Date of Birth: [birth year]  Date of referral:  08/27/15               Reason for consult:  Discharge Planning, Facility Placement                Permission sought to share information with:  Facility Sport and exercise psychologist, Family Supports Permission granted to share information::  Yes, Verbal Permission Granted  Name::     Publishing rights manager::  SNFs  Relationship::  Wife  Contact Information:     Housing/Transportation Living arrangements for the past 2 months:  Single Family Home Source of Information:  Spouse Patient Interpreter Needed:  None Criminal Activity/Legal Involvement Pertinent to Current Situation/Hospitalization:  No - Comment as needed Significant Relationships:  Spouse Lives with:  Spouse Do you feel safe going back to the place where you live?  Yes Need for family participation in patient care:  Yes (Comment)  Care giving concerns:  The patient's wife does have concerns about the distance she would have to drive to see the patient if he goes to CIR. She doesn't rule out CIR, but would be interested in SNF as an alternative.   Social Worker assessment / plan:  CSW spoke with patient's wife by phone to complete assessment. The patient's wife states that the patient lives at home with her. She understands the recommendation for CIR, but states that she may be interested in having him closer as she does not drive and relies on other's for transportation. CSW explained SNF search/placement process and answered the wife's questions. CSW will followup with any available bed offers.  Employment status:  Disabled (Comment on whether or not currently receiving Disability) Insurance information:    PT Recommendations:  Inpatient Rehab Consult Information / Referral to community resources:  [place]  Patient/Family's Response to care:  The patient's wife states that she has  been happy with the care the patient has received and appreciates the assistance of CSW.  Patient/Family's Understanding of and Emotional Response to Diagnosis, Current Treatment, and Prognosis:  The patient's wife appears to have fair insight into reason for the patient's admission and his post DC needs.   Emotional Assessment Appearance:  Appears stated age Attitude/Demeanor/Rapport:  Unable to Assess Affect (typically observed):  Unable to Assess Orientation:  Oriented to Self Alcohol / Substance use:  Not Applicable Psych involvement (Current and /or in the community):  No (Comment)  Discharge Needs  Concerns to be addressed:  Discharge Planning Concerns Readmission within the last 30 days:  No Current discharge risk:  Chronically ill, Cognitively Impaired, Physical Impairment Barriers to Discharge:  Continued Medical Work up   Frank Noel, LCSW 08/27/2015, 3:28 PM

## 2015-08-27 NOTE — Progress Notes (Signed)
PROGRESS NOTE    Frank Hoover  WER:[id number] DOB: 1950-06-18 DOA: 08/14/2015 PCP: Alonza Bogus, MD    Brief Narrative:  65 year old male was originally seen by his nephrologist in [place], was found to be in A. fib with RVR , admitted to any pain on 08/15/15 , noted to be in a flutter with RVR which is new diagnosis, 2-D echo showing EF 25%, and developed septic and cardiogenic shock, febrile 104, transferred to PCCM at [facility], treated for  Bacteremia due to UTI, requiring  pressor support, he required intubation by PCCM, successfully extubated on 6/19 Much improved and was transferred to [facility] on 6/20.  Patient was seen by cardiology and EP for atrial flutter, and SVT,initially  on heparin drip, transitioned to by mouth warfarin, with significant anemia, Hemoccult-positive stool, seen by GI, S/P Flex Sig, suspicious for ischemic colitis.    Assessment & Plan:   Principal Problem:   Atrial flutter with rapid ventricular response (HCC) Active Problems:   GERD   DM (diabetes mellitus) (HCC)   HTN (hypertension)   Hyperlipemia   Sleep apnea   Systolic CHF (HCC)   Acute respiratory failure (HCC)   Septic shock (HCC)   Acute encephalopathy   Acute kidney injury ([place])   Acute systolic heart failure (HCC)   SVT (supraventricular tachycardia) ([place])   Debility   Blood in stool   Acute blood loss anemia   Abnormal CT scan, colon   Proctosigmoiditis   Lower GI bleed   Acute hypoxic resp failure - With history of OSA, COPD, and cardiogenic/septic shock - Bipap Qhs for sleep apnea - improving, currently on 2 L nasal cannula  Septic shock with citrobacter bacteremia  - Shock resolved  Citrobacter bacteremia in the setting of UTI - Blood cultures and urine culture growing Citrobacter, treated with total of 8 days of Antibiotics, resumed Rocephin 6/22 for total of 2 weeks treatment (stop date 6/28) -  repeat blood cultures 6/22: One out of 2 bottles growing gram-positive  cocci on 6/24, started empirically IV vancomycin pending final results, this is most likely contaminant   Lower GI bleed - most likely in the setting of ischemic colitis, status post flexible sigmoidoscopy on 6/23, follow on biopsies, continue to monitor hemoglobin closely and transfuse as needed. - hemoglobin dropped to 7.5 today, had a small amount of mucousy stool , blood tinged overnight, will transfuse 2 units PRBC today. - We'll hold on warfarin, continue only with heparin GTT for anticoagulation, will try to keep therapeutic range on the lower side.  Supratherapeutic INR/coagulopathy - Secondary to warfarin - INR 6.3 on 6/24, received 5 mg of IV vitamin K, today is 1.3.,  HCAP - Treated  Cardiogenic shock/acute  systolic CHF with EF 19% - Currently off pressors, management per cardiology, on beta blockers and hydralazine - Diuresis per cardiology, appears to be improving, not on ACEI/ARB/spiral due to ARF.  Atrial fib - Management per cardiology, status post s/p  TEE/DCCV 08/22/15 - On anticoagulation, will hold warfarin, continue with heparin GTT, keep on the lower side monitor closely H&H - Back in a flutter , back on amiodarone drip, continue with oral metoprolol, started on Cardizem CD.  Episode of SVT on 08/23/18 - required Cardizem as such, IV adenosine, EP consult appreciated  HTN - Blood pressure acceptable, clonidine wean down, started on metoprolol and cardizem.  AKI on CKD  - Continue to monitor, improving despite diuresis  Anemia  - Multifactorial, setting of chronic kidney disease, chronic  illness, and GI bleed, monitor closely and transfuse as needed  DM  - Better controlled after increasing Lantus, continue with insulin sliding scale .  Urinary retention -foley per urology, to remain for several weeks to allow urethra healing better urology recommendation.  Acute metabolic encephalopathy - Improving    DVT prophylaxis:  Back on heaprin GTT  Code  Status: Full Code   Family Communication: none at bedside  Disposition Plan:  Remain in SDU, seen by CIR,    Consultants:   Cards  PCCM  Urology  Inpatient rehabilitation  Procedures:  6/13 R Femoral CVL >> 6/13 6/13 ETT >> 6/18 6/13 Lt radial aline >> 6/17 6/13 RIJ CVL >>>plan out 6/19 6/25 2 units PRBC transfusion  Subjective: Feeling better, report , at baseline, denies chest pain, reports fatigue, one episode of small amount of mucousy stool blood-tinged no evidence of overt GI bleed.  Objective: Filed Vitals:   08/27/15 0800 08/27/15 0802 08/27/15 1000 08/27/15 1045  BP: 163/93 163/93 153/84 149/87  Pulse: 124 124 127 127  Temp:  99.2 F (37.3 C)  98.7 F (37.1 C)  TempSrc:  Oral  Oral  Resp: 18 23 22 15   Height:      Weight:      SpO2: 99% 99% 100% 100%    Intake/Output Summary (Last 24 hours) at 08/27/15 1055 Last data filed at 08/27/15 1045  Gross per 24 hour  Intake 2140.49 ml  Output   2995 ml  Net -854.51 ml   Filed Weights   08/25/15 0407 08/26/15 0500 08/27/15 0117  Weight: 120.022 kg (264 lb 9.6 oz) 118.434 kg (261 lb 1.6 oz) 117.1 kg (258 lb 2.5 oz)    Examination:  General exam: Awake, alert,  Respiratory system: diminshed, no wheezing, Respiratory effort normal. Cardiovascular system: S1 & S2 heard, RRR. No JVD, murmurs, rubs, gallops or clicks. No pedal edema. Gastrointestinal system: Abdomen is nondistended, soft and nontender, obese. Lower extremity: + edema, pulses felt bilaterally     Data Reviewed: I have personally reviewed following labs and imaging studies  CBC:  Recent Labs Lab 08/25/15 0020 08/25/15 0423 08/26/15 0620 08/26/15 1352 08/27/15 0326  WBC 8.4 8.1 6.8 6.3 5.0  HGB 8.4* 8.4* 9.0* 8.6* 7.5*  HCT 26.2* 26.4* 28.9* 27.3* 24.4*  MCV 87.0 86.8 87.8 87.2 89.7  PLT 231 239 290 298 629   Basic Metabolic Panel:  Recent Labs Lab 08/20/15 1335 08/21/15 0430  08/23/15 0250 08/24/15 0247  08/25/15 0423 08/26/15 0620 08/27/15 0326  NA  --  144  < > 138 135 136 136 135  K  --  3.2*  < > 3.6 3.1* 3.8 3.7 3.2*  CL  --  105  < > 102 102 106 105 104  CO2  --  27  < > 27 25 25 22 24   GLUCOSE  --  281*  < > 248* 161* 170* 191* 154*  BUN  --  77*  < > 73* 61* 47* 31* 23*  CREATININE  --  3.25*  < > 3.02* 2.73* 2.49* 2.06* 1.91*  CALCIUM  --  8.6*  < > 8.1* 8.0* 8.1* 8.5* 8.0*  MG 2.5* 2.3  --   --  1.8  --   --   --   PHOS  --  3.1  --   --   --   --   --   --   < > = values in this interval not displayed. GFR: Estimated  Creatinine Clearance: 47.6 mL/min (by C-G formula based on Cr of 1.91). Liver Function Tests:  Recent Labs Lab 08/25/15 0423  AST 38  ALT 41  ALKPHOS 57  BILITOT 0.1*  PROT 5.4*  ALBUMIN 1.8*   No results for input(s): LIPASE, AMYLASE in the last 168 hours. No results for input(s): AMMONIA in the last 168 hours. Coagulation Profile:  Recent Labs Lab 08/23/15 0853 08/24/15 0247 08/25/15 0423 08/26/15 0620 08/27/15 0326  INR 1.36 1.70* 3.69* 6.36* 1.34   Cardiac Enzymes: No results for input(s): CKTOTAL, CKMB, CKMBINDEX, TROPONINI in the last 168 hours. BNP (last 3 results) No results for input(s): PROBNP in the last 8760 hours. HbA1C: No results for input(s): HGBA1C in the last 72 hours. CBG:  Recent Labs Lab 08/26/15 0750 08/26/15 1205 08/26/15 1632 08/26/15 2111 08/27/15 0800  GLUCAP 196* 193* 257* 166* 159*   Lipid Profile: No results for input(s): CHOL, HDL, LDLCALC, TRIG, CHOLHDL, LDLDIRECT in the last 72 hours. Thyroid Function Tests: No results for input(s): TSH, T4TOTAL, FREET4, T3FREE, THYROIDAB in the last 72 hours. Anemia Panel: No results for input(s): VITAMINB12, FOLATE, FERRITIN, TIBC, IRON, RETICCTPCT in the last 72 hours. Urine analysis:    Component Value Date/Time   COLORURINE AMBER* 08/14/2015 2125   APPEARANCEUR HAZY* 08/14/2015 2125   LABSPEC 1.015 08/14/2015 2125   PHURINE 5.0 08/14/2015 2125    GLUCOSEU 100* 08/14/2015 2125   HGBUR LARGE* 08/14/2015 2125   BILIRUBINUR NEGATIVE 08/14/2015 2125   KETONESUR NEGATIVE 08/14/2015 2125   PROTEINUR 100* 08/14/2015 2125   UROBILINOGEN 0.2 12/02/2013 1739   NITRITE NEGATIVE 08/14/2015 2125   LEUKOCYTESUR MODERATE* 08/14/2015 2125   Sepsis Labs: @LABRCNTIP (procalcitonin:4,lacticidven:4)  ) Recent Results (from the past 240 hour(s))  C difficile quick scan w PCR reflex     Status: None   Collection Time: 08/23/15  4:14 PM  Result Value Ref Range Status   C Diff antigen NEGATIVE NEGATIVE Final   C Diff toxin NEGATIVE NEGATIVE Final   C Diff interpretation Negative for toxigenic C. difficile  Final  Gastrointestinal Panel by PCR , Stool     Status: None   Collection Time: 08/24/15 11:29 AM  Result Value Ref Range Status   Campylobacter species NOT DETECTED NOT DETECTED Final   Plesimonas shigelloides NOT DETECTED NOT DETECTED Final   Salmonella species NOT DETECTED NOT DETECTED Final   Yersinia enterocolitica NOT DETECTED NOT DETECTED Final   Vibrio species NOT DETECTED NOT DETECTED Final   Vibrio cholerae NOT DETECTED NOT DETECTED Final   Enteroaggregative E coli (EAEC) NOT DETECTED NOT DETECTED Final   Enteropathogenic E coli (EPEC) NOT DETECTED NOT DETECTED Final   Enterotoxigenic E coli (ETEC) NOT DETECTED NOT DETECTED Final   Shiga like toxin producing E coli (STEC) NOT DETECTED NOT DETECTED Final   E. coli O157 NOT DETECTED NOT DETECTED Final   Shigella/Enteroinvasive E coli (EIEC) NOT DETECTED NOT DETECTED Final   Cryptosporidium NOT DETECTED NOT DETECTED Final   Cyclospora cayetanensis NOT DETECTED NOT DETECTED Final   Entamoeba histolytica NOT DETECTED NOT DETECTED Final   Giardia lamblia NOT DETECTED NOT DETECTED Final   Adenovirus F40/41 NOT DETECTED NOT DETECTED Final   Astrovirus NOT DETECTED NOT DETECTED Final   Norovirus GI/GII NOT DETECTED NOT DETECTED Final   Rotavirus A NOT DETECTED NOT DETECTED Final    Sapovirus (I, II, IV, and V) NOT DETECTED NOT DETECTED Final  Culture, blood (Routine X 2) w Reflex to ID Panel  Status: None (Preliminary result)   Collection Time: 08/24/15 12:20 PM  Result Value Ref Range Status   Specimen Description BLOOD LEFT HAND  Final   Special Requests BOTTLES DRAWN AEROBIC ONLY 10CC  Final   Culture NO GROWTH 2 DAYS  Final   Report Status PENDING  Incomplete  Culture, blood (Routine X 2) w Reflex to ID Panel     Status: None (Preliminary result)   Collection Time: 08/24/15 12:40 PM  Result Value Ref Range Status   Specimen Description BLOOD LEFT HAND  Final   Special Requests IN PEDIATRIC BOTTLE 2.5CC  Final   Culture  Setup Time   Final    GRAM POSITIVE COCCI IN CLUSTERS AEROBIC BOTTLE ONLY CRITICAL RESULT CALLED TO, READ BACK BY AND VERIFIED WITH: JAMES LEDFORD,PHARMD @0113  08/26/15 MKELLY    Culture GRAM POSITIVE COCCI IDENTIFICATION TO FOLLOW   Final   Report Status PENDING  Incomplete  Blood Culture ID Panel (Reflexed)     Status: None   Collection Time: 08/24/15 12:40 PM  Result Value Ref Range Status   Enterococcus species NOT DETECTED NOT DETECTED Final   Vancomycin resistance NOT DETECTED NOT DETECTED Final   Listeria monocytogenes NOT DETECTED NOT DETECTED Final   Staphylococcus species NOT DETECTED NOT DETECTED Final   Staphylococcus aureus NOT DETECTED NOT DETECTED Final   Methicillin resistance NOT DETECTED NOT DETECTED Final   Streptococcus species NOT DETECTED NOT DETECTED Final   Streptococcus agalactiae NOT DETECTED NOT DETECTED Final   Streptococcus pneumoniae NOT DETECTED NOT DETECTED Final   Streptococcus pyogenes NOT DETECTED NOT DETECTED Final   Acinetobacter baumannii NOT DETECTED NOT DETECTED Final   Enterobacteriaceae species NOT DETECTED NOT DETECTED Final   Enterobacter cloacae complex NOT DETECTED NOT DETECTED Final   Escherichia coli NOT DETECTED NOT DETECTED Final   Klebsiella oxytoca NOT DETECTED NOT DETECTED Final    Klebsiella pneumoniae NOT DETECTED NOT DETECTED Final   Proteus species NOT DETECTED NOT DETECTED Final   Serratia marcescens NOT DETECTED NOT DETECTED Final   Carbapenem resistance NOT DETECTED NOT DETECTED Final   Haemophilus influenzae NOT DETECTED NOT DETECTED Final   Neisseria meningitidis NOT DETECTED NOT DETECTED Final   Pseudomonas aeruginosa NOT DETECTED NOT DETECTED Final   Candida albicans NOT DETECTED NOT DETECTED Final   Candida glabrata NOT DETECTED NOT DETECTED Final   Candida krusei NOT DETECTED NOT DETECTED Final   Candida parapsilosis NOT DETECTED NOT DETECTED Final   Candida tropicalis NOT DETECTED NOT DETECTED Final      Anti-infectives    Start     Dose/Rate Route Frequency Ordered Stop   08/27/15 1100  vancomycin (VANCOCIN) 1,250 mg in sodium chloride 0.9 % 250 mL IVPB     1,250 mg 166.7 mL/hr over 90 Minutes Intravenous Every 24 hours 08/27/15 0842     08/26/15 0130  vancomycin (VANCOCIN) 1,500 mg in sodium chloride 0.9 % 500 mL IVPB  Status:  Discontinued     1,500 mg 250 mL/hr over 120 Minutes Intravenous Every 48 hours 08/26/15 0121 08/27/15 0841   08/24/15 1200  cefTRIAXone (ROCEPHIN) 2 g in dextrose 5 % 50 mL IVPB     2 g 100 mL/hr over 30 Minutes Intravenous Every 24 hours 08/24/15 1055     08/20/15 0000  cefTRIAXone (ROCEPHIN) 2 g in dextrose 5 % 50 mL IVPB  Status:  Discontinued     2 g 100 mL/hr over 30 Minutes Intravenous Every 24 hours 08/19/15 1112 08/22/15 1310  08/18/15 1100  cefTAZidime (FORTAZ) 2 g in dextrose 5 % 50 mL IVPB  Status:  Discontinued     2 g 100 mL/hr over 30 Minutes Intravenous Every 24 hours 08/18/15 1039 08/19/15 1112   08/17/15 1800  vancomycin (VANCOCIN) 1,500 mg in sodium chloride 0.9 % 500 mL IVPB  Status:  Discontinued     1,500 mg 250 mL/hr over 120 Minutes Intravenous Every 48 hours 08/15/15 1636 08/15/15 1956   08/16/15 2100  ceFEPIme (MAXIPIME) 1 g in dextrose 5 % 50 mL IVPB  Status:  Discontinued     1 g 100  mL/hr over 30 Minutes Intravenous Every 24 hours 08/15/15 1957 08/16/15 1415   08/16/15 2100  ceFEPIme (MAXIPIME) 2 g in dextrose 5 % 50 mL IVPB  Status:  Discontinued     2 g 100 mL/hr over 30 Minutes Intravenous Every 24 hours 08/16/15 1415 08/16/15 1741   08/16/15 1900  piperacillin-tazobactam (ZOSYN) IVPB 2.25 g  Status:  Discontinued     2.25 g 100 mL/hr over 30 Minutes Intravenous Every 8 hours 08/16/15 1809 08/18/15 1014   08/15/15 2100  ceFEPIme (MAXIPIME) 2 g in dextrose 5 % 50 mL IVPB     2 g 100 mL/hr over 30 Minutes Intravenous  Once 08/15/15 1957 08/15/15 2359   08/15/15 1730  vancomycin (VANCOCIN) 2,000 mg in sodium chloride 0.9 % 500 mL IVPB     2,000 mg 250 mL/hr over 120 Minutes Intravenous  Once 08/15/15 1636 08/15/15 2000   08/15/15 0900  piperacillin-tazobactam (ZOSYN) IVPB 3.375 g  Status:  Discontinued     3.375 g 12.5 mL/hr over 240 Minutes Intravenous Every 8 hours 08/15/15 0856 08/15/15 1956   08/15/15 0100  vancomycin (VANCOCIN) IVPB 1000 mg/200 mL premix     1,000 mg 200 mL/hr over 60 Minutes Intravenous STAT 08/15/15 0048 08/15/15 0206   08/14/15 2330  vancomycin (VANCOCIN) IVPB 1000 mg/200 mL premix     1,000 mg 200 mL/hr over 60 Minutes Intravenous STAT 08/14/15 2316 08/15/15 0041   08/14/15 2330  piperacillin-tazobactam (ZOSYN) IVPB 3.375 g     3.375 g 12.5 mL/hr over 240 Minutes Intravenous STAT 08/14/15 2317 08/15/15 0341       Radiology Studies: No results found.      Scheduled Meds: . acyclovir ointment   Topical 6 X Daily  . amiodarone  400 mg Oral BID  . cefTRIAXone (ROCEPHIN)  IV  2 g Intravenous Q24H  . feeding supplement (ENSURE ENLIVE)  237 mL Oral TID BM  . feeding supplement (GLUCERNA SHAKE)  237 mL Oral TID BM  . furosemide  20 mg Intravenous Once  . furosemide  40 mg Oral Daily  . hydrALAZINE  75 mg Oral Q8H  . insulin aspart  0-15 Units Subcutaneous TID WC  . insulin glargine  32 Units Subcutaneous Daily  . isosorbide  mononitrate  60 mg Oral Daily  . metoprolol tartrate  75 mg Oral BID  . oxybutynin  5 mg Oral TID  . pantoprazole  40 mg Oral Daily  . potassium chloride  30 mEq Oral Q6H  . sodium chloride flush  3 mL Intravenous Q12H  . vancomycin  1,250 mg Intravenous Q24H  . Warfarin - Pharmacist Dosing Inpatient   Does not apply q1800   Continuous Infusions: . sodium chloride 250 mL (08/27/15 0517)  . sodium chloride Stopped (08/21/15 1617)  . amiodarone 30 mg/hr (08/27/15 0332)  . heparin       LOS: 13  days    Time spent: 59 min    Cacie Gaskins, MD [facility] Pager [phone]  If 7PM-7AM, please contact night-coverage [email or web address] Password [facility] 08/27/2015, 10:55 AM

## 2015-08-27 NOTE — NC FL2 (Signed)
[place] LEVEL OF CARE SCREENING TOOL     IDENTIFICATION  Patient Name: Frank Hoover Birthdate: 04-16-1950 Sex: male Admission Date (Current Location): 08/14/2015  [facility] and [place] Number:  [employer] and Address:  [facility] [place], [place] [address] [place], [place] [address]      Provider Number: [id number]  Attending Physician Name and Address:  Albertine Patricia, MD  Relative Name and Phone Number:       Current Level of Care: Hospital Recommended Level of Care: [place] Prior Approval Number:    Date Approved/Denied:   PASRR Number: [id number]  Discharge Plan: SNF    Current Diagnoses: Patient Active Problem List   Diagnosis Date Noted  . Proctosigmoiditis   . Lower GI bleed   . SVT (supraventricular tachycardia) ([place]) 08/24/2015  . Debility   . Blood in stool   . Acute blood loss anemia   . Abnormal CT scan, colon   . Acute systolic heart failure ([place])   . Acute kidney injury ([place])   . Acute respiratory failure ([place])   . Septic shock ([place])   . Acute encephalopathy   . Atrial flutter with rapid ventricular response ([place]) 08/14/2015  . Systolic CHF ([place]) [id number]  . History of colonic polyps   . Dehydration 12/05/2013  . Acute renal failure ([place]) 12/02/2013  . Sleep apnea 12/02/2013  . Hyponatremia 12/02/2013  . Acute on chronic renal failure ([place]) 12/02/2013  . COPD (chronic obstructive pulmonary disease) ([place])   . DM (diabetes mellitus) ([place])   . HTN (hypertension)   . Hyperlipemia   . CRF (chronic renal failure)   . Hepatomegaly 03/25/2013  . Dysphagia 12/06/2010  . History of adenomatous polyp of colon 09/21/2010  . Gastroparesis 09/21/2010  . Obesity 09/21/2010  . GERD 10/09/2009  . Constipation 10/09/2009  . ABDOMINAL PAIN-EPIGASTRIC 10/09/2009    Orientation RESPIRATION BLADDER Height & Weight     Self  O2 (3L) Incontinent Weight: 117.1 kg (258 lb 2.5 oz) Height:   5\' 6"  (167.6 cm)  BEHAVIORAL SYMPTOMS/MOOD NEUROLOGICAL BOWEL NUTRITION STATUS   (NONE)  (NONE) Incontinent Diet (Heart Healthy)  AMBULATORY STATUS COMMUNICATION OF NEEDS Skin   Extensive Assist Verbally PU Stage and Appropriate Care   PU Stage 2 Dressing:  (Left buttocks - PRN foam dressing)                   Personal Care Assistance Level of Assistance  Bathing, Feeding, Dressing Bathing Assistance: Limited assistance Feeding assistance: Limited assistance Dressing Assistance: Limited assistance     Functional Limitations Info  Sight, Hearing, Speech Sight Info: Adequate Hearing Info: Adequate Speech Info: Adequate    SPECIAL CARE FACTORS FREQUENCY  PT (By licensed PT), OT (By licensed OT)     PT Frequency: 5/week OT Frequency: 5/week            Contractures Contractures Info: Not present    Additional Factors Info  Code Status, Allergies, Insulin Sliding Scale Code Status Info: Full Allergies Info: NKDA   Insulin Sliding Scale Info: 3/day       Current Medications (08/27/2015):  This is the current hospital active medication list Current Facility-Administered Medications  Medication Dose Route Frequency Provider Last Rate Last Dose  . 0.9 %  sodium chloride infusion  250 mL Intravenous Continuous Bhavinkumar Bhagat, PA 20 mL/hr at 08/27/15 0517 250 mL at 08/27/15 0517  . 0.9 %  sodium chloride infusion   Intravenous  Continuous Rush Farmer, MD   Stopped at 08/21/15 1617  . acetaminophen (TYLENOL) solution 650 mg  650 mg Oral Q4H PRN Sinda Du, MD   650 mg at 08/22/15 2256  . acyclovir ointment (ZOVIRAX) 5 %   Topical 6 X Daily Albertine Patricia, MD   1 application at [id number]  . amiodarone (NEXTERONE PREMIX) 360-4.14 MG/200ML-% (1.8 mg/mL) IV infusion  30 mg/hr Intravenous Continuous Dorothy Spark, MD 16.7 mL/hr at 08/27/15 0332 30 mg/hr at 08/27/15 0332  . cefTRIAXone (ROCEPHIN) 2 g in dextrose 5 % 50 mL IVPB  2 g Intravenous Q24H Albertine Patricia, MD   2 g at 08/27/15 1328  . diltiazem (CARDIZEM CD) 24 hr capsule 120 mg  120 mg Oral Daily Dorothy Spark, MD   120 mg at 08/27/15 1328  . feeding supplement (ENSURE ENLIVE) (ENSURE ENLIVE) liquid 237 mL  237 mL Oral TID BM Silver Huguenin Elgergawy, MD   237 mL at 08/27/15 1400  . feeding supplement (GLUCERNA SHAKE) (GLUCERNA SHAKE) liquid 237 mL  237 mL Oral TID BM Silver Huguenin Elgergawy, MD   237 mL at 08/27/15 1400  . furosemide (LASIX) tablet 40 mg  40 mg Oral Daily Peter M Martinique, MD   40 mg at 08/27/15 0954  . heparin ADULT infusion 100 units/mL (25000 units/222mL sodium chloride 0.45%)  900 Units/hr Intravenous Continuous Durwin Nora, [facility]   Stopped at 08/27/15 1015  . hydrALAZINE (APRESOLINE) injection 10 mg  10 mg Intravenous Q4H PRN Jolaine Artist, MD   10 mg at 08/25/15 [id number]  . hydrALAZINE (APRESOLINE) tablet 75 mg  75 mg Oral Q8H Dorothy Spark, MD   75 mg at 08/27/15 1328  . hydrocortisone cream 1 % 1 application  1 application Topical TID PRN Satira Sark, MD      . insulin aspart (novoLOG) injection 0-15 Units  0-15 Units Subcutaneous TID WC Raylene Miyamoto, MD   5 Units at 08/27/15 1328  . insulin glargine (LANTUS) injection 32 Units  32 Units Subcutaneous Daily Albertine Patricia, MD   32 Units at 08/27/15 [phone]  . ipratropium-albuterol (DUONEB) 0.5-2.5 (3) MG/3ML nebulizer solution 3 mL  3 mL Nebulization Q4H PRN Donita Brooks, NP      . isosorbide mononitrate (IMDUR) 24 hr tablet 60 mg  60 mg Oral Daily Peter M Martinique, MD   60 mg at 08/27/15 0955  . loperamide (IMODIUM) capsule 2 mg  2 mg Oral PRN Albertine Patricia, MD   2 mg at 08/24/15 1702  . metoprolol (LOPRESSOR) injection 5 mg  5 mg Intravenous Q6H PRN Dorothy Spark, MD   5 mg at 08/27/15 0331  . metoprolol tartrate (LOPRESSOR) tablet 75 mg  75 mg Oral BID Dorothy Spark, MD   75 mg at 08/27/15 0955  . ondansetron (ZOFRAN) injection 4 mg  4 mg Intravenous Q6H PRN Norval Morton, MD   4 mg at  08/25/15 2146  . oxybutynin (DITROPAN) tablet 5 mg  5 mg Oral TID Franchot Gallo, MD   5 mg at 08/27/15 0954  . pantoprazole (PROTONIX) EC tablet 40 mg  40 mg Oral Daily Peter M Martinique, MD   40 mg at 08/27/15 0954  . potassium chloride (K-DUR,KLOR-CON) CR tablet 30 mEq  30 mEq Oral Q6H Albertine Patricia, MD   30 mEq at 08/27/15 1144  . sodium chloride flush (NS) 0.9 % injection 10-40 mL  10-40 mL  Intracatheter PRN Rush Farmer, MD      . sodium chloride flush (NS) 0.9 % injection 10-40 mL  10-40 mL Intracatheter PRN Rush Farmer, MD   40 mL at 08/26/15 0526  . sodium chloride flush (NS) 0.9 % injection 3 mL  3 mL Intravenous Q12H Bhavinkumar Bhagat, PA   3 mL at 08/27/15 1000  . sodium chloride flush (NS) 0.9 % injection 3 mL  3 mL Intravenous PRN Bhavinkumar Bhagat, PA      . vancomycin (VANCOCIN) 1,250 mg in sodium chloride 0.9 % 250 mL IVPB  1,250 mg Intravenous Q24H Durwin Nora, RPH   1,250 mg at 08/27/15 1144  . Warfarin - Pharmacist Dosing Inpatient   Does not apply [place], [facility]         Discharge Medications: Please see discharge summary for a list of discharge medications.  Relevant Imaging Results:  Relevant Lab Results:   Additional Information SSN: [id number]  Rigoberto Noel, LCSW

## 2015-08-27 NOTE — Clinical Social Work Placement (Signed)
   CLINICAL SOCIAL WORK PLACEMENT  NOTE  Date:  08/27/2015  Patient Details  Name: Frank Hoover MRN: [id number] Date of Birth: 09-08-1950  Clinical Social Work is seeking post-discharge placement for this patient at the [place] level of care (*CSW will initial, date and re-position this form in  chart as items are completed):  Yes   Patient/family provided with [facility]'s list of facilities offering this level of care within the geographic area requested by the patient (or if unable, by the patient's family).  Yes   Patient/family informed of their freedom to choose among providers that offer the needed level of care, that participate in Medicare, Medicaid or managed care program needed by the patient, have an available bed and are willing to accept the patient.  Yes   Patient/family informed of [place]'s ownership interest in [facility] and [facility] as well as of the fact that they are under no obligation to receive care at these facilities.  PASRR submitted to EDS on 08/27/15     PASRR number received on 08/27/15     Existing PASRR number confirmed on       FL2 transmitted to all facilities in geographic area requested by pt/family on 08/27/15     FL2 transmitted to all facilities within larger geographic area on       Patient informed that his/her managed care company has contracts with or will negotiate with certain facilities, including the following:            Patient/family informed of bed offers received.  Patient chooses bed at       Physician recommends and patient chooses bed at      Patient to be transferred to   on  .  Patient to be transferred to facility by       Patient family notified on   of transfer.  Name of family member notified:        PHYSICIAN Please prepare priority discharge summary, including medications, Please prepare prescriptions, Please sign FL2     Additional  Comment:    _______________________________________________ Rigoberto Noel, LCSW 08/27/2015, 3:49 PM

## 2015-08-27 NOTE — Progress Notes (Signed)
Notified md about pt's labs hgb 7.5 and potassium 3.2 this am.  Will continue to monitor. Frank Hoover

## 2015-08-28 DIAGNOSIS — R933 Abnormal findings on diagnostic imaging of other parts of digestive tract: Secondary | ICD-10-CM

## 2015-08-28 DIAGNOSIS — K529 Noninfective gastroenteritis and colitis, unspecified: Secondary | ICD-10-CM | POA: Diagnosis present

## 2015-08-28 LAB — CBC
HCT: 33.6 % — ABNORMAL LOW (ref 39.0–52.0)
HCT: 36.2 % — ABNORMAL LOW (ref 39.0–52.0)
Hemoglobin: 11 g/dL — ABNORMAL LOW (ref 13.0–17.0)
Hemoglobin: 11.9 g/dL — ABNORMAL LOW (ref 13.0–17.0)
MCH: 27.5 pg (ref 26.0–34.0)
MCH: 27.8 pg (ref 26.0–34.0)
MCHC: 32.7 g/dL (ref 30.0–36.0)
MCHC: 32.9 g/dL (ref 30.0–36.0)
MCV: 84 fL (ref 78.0–100.0)
MCV: 84.6 fL (ref 78.0–100.0)
Platelets: 319 10*3/uL (ref 150–400)
Platelets: 296 10*3/uL (ref 150–400)
RBC: 4 MIL/uL — ABNORMAL LOW (ref 4.22–5.81)
RBC: 4.28 MIL/uL (ref 4.22–5.81)
RDW: 17.8 % — ABNORMAL HIGH (ref 11.5–15.5)
RDW: 18.2 % — ABNORMAL HIGH (ref 11.5–15.5)
WBC: 7 10*3/uL (ref 4.0–10.5)
WBC: 6.9 10*3/uL (ref 4.0–10.5)

## 2015-08-28 LAB — TYPE AND SCREEN
ABO/RH(D): B POS
Antibody Screen: NEGATIVE
Unit division: 0
Unit division: 0

## 2015-08-28 LAB — PHOSPHORUS: Phosphorus: 2.5 mg/dL (ref 2.5–4.6)

## 2015-08-28 LAB — GLUCOSE, CAPILLARY
Glucose-Capillary: 169 mg/dL — ABNORMAL HIGH (ref 65–99)
Glucose-Capillary: 184 mg/dL — ABNORMAL HIGH (ref 65–99)
Glucose-Capillary: 187 mg/dL — ABNORMAL HIGH (ref 65–99)
Glucose-Capillary: 218 mg/dL — ABNORMAL HIGH (ref 65–99)

## 2015-08-28 LAB — BASIC METABOLIC PANEL
Anion gap: 8 (ref 5–15)
BUN: 18 mg/dL (ref 6–20)
CO2: 23 mmol/L (ref 22–32)
Calcium: 8.4 mg/dL — ABNORMAL LOW (ref 8.9–10.3)
Chloride: 106 mmol/L (ref 101–111)
Creatinine, Ser: 1.78 mg/dL — ABNORMAL HIGH (ref 0.61–1.24)
GFR calc Af Amer: 45 mL/min — ABNORMAL LOW (ref >60)
GFR calc non Af Amer: 39 mL/min — ABNORMAL LOW (ref >60)
Glucose, Bld: 202 mg/dL — ABNORMAL HIGH (ref 65–99)
Potassium: 3.8 mmol/L (ref 3.5–5.1)
Sodium: 137 mmol/L (ref 135–145)

## 2015-08-28 LAB — MAGNESIUM: Magnesium: 1.6 mg/dL — ABNORMAL LOW (ref 1.7–2.4)

## 2015-08-28 LAB — CULTURE, BLOOD (ROUTINE X 2)

## 2015-08-28 LAB — PROTIME-INR
INR: 1.4 (ref 0.00–1.49)
Prothrombin Time: 17.3 seconds — ABNORMAL HIGH (ref 11.6–15.2)

## 2015-08-28 LAB — HEPARIN LEVEL (UNFRACTIONATED): Heparin Unfractionated: <0.1 IU/mL — ABNORMAL LOW (ref 0.30–0.70)

## 2015-08-28 MED ORDER — WARFARIN SODIUM 1 MG PO TABS
1.0000 mg | ORAL_TABLET | Freq: Once | ORAL | Status: AC
  Administered 2015-08-28: 1 mg via ORAL
  Filled 2015-08-28: qty 1

## 2015-08-28 MED ORDER — HEPARIN (PORCINE) IN NACL 100-0.45 UNIT/ML-% IJ SOLN
1650.0000 [IU]/h | INTRAMUSCULAR | Status: DC
  Administered 2015-08-29: 1100 [IU]/h via INTRAVENOUS
  Administered 2015-08-30: 1800 [IU]/h via INTRAVENOUS
  Administered 2015-08-30: 1700 [IU]/h via INTRAVENOUS
  Administered 2015-08-31 – 2015-09-03 (×6): 1800 [IU]/h via INTRAVENOUS
  Administered 2015-09-04: 1650 [IU]/h via INTRAVENOUS
  Filled 2015-08-28 (×10): qty 250

## 2015-08-28 NOTE — Progress Notes (Signed)
ANTICOAGULATION CONSULT NOTE - Follow Up Consult  Pharmacy Consult for coumadin, heparin  Indication: atrial fibrillation  No Known Allergies  Patient Measurements: Height: 5\' 6"  (167.6 cm) Weight: 252 lb 6.8 oz (114.5 kg) IBW/kg (Calculated) : 63.8 Heparin Dosing Weight: 94kg  Vital Signs: Temp: 98.6 F (37 C) (06/26 0752) Temp Source: Oral (06/26 0752) BP: 147/88 mmHg (06/26 0752) Pulse Rate: 127 (06/26 0752)  Labs:  Recent Labs  08/26/15 0620 08/26/15 1352 08/27/15 0326 08/27/15 1827 08/27/15 1828  HGB 9.0* 8.6* 7.5* 11.0*  --   HCT 28.9* 27.3* 24.4* 33.6*  --   PLT 290 298 292 319  --   LABPROT 53.9*  --  16.7*  --   --   INR 6.36*  --  1.34  --   --   HEPARINUNFRC  --   --   --   --  <0.10*  CREATININE 2.06*  --  1.91*  --   --     Estimated Creatinine Clearance: 47.1 mL/min (by C-G formula based on Cr of 1.91).   Medications:  Scheduled:  . acyclovir ointment   Topical 6 X Daily  . cefTRIAXone (ROCEPHIN)  IV  2 g Intravenous Q24H  . diltiazem  120 mg Oral Daily  . feeding supplement (ENSURE ENLIVE)  237 mL Oral TID BM  . furosemide  40 mg Oral Daily  . hydrALAZINE  75 mg Oral Q8H  . insulin aspart  0-15 Units Subcutaneous TID WC  . insulin glargine  32 Units Subcutaneous Daily  . isosorbide mononitrate  60 mg Oral Daily  . metoprolol tartrate  75 mg Oral BID  . oxybutynin  5 mg Oral TID  . pantoprazole  40 mg Oral Daily  . sodium chloride flush  3 mL Intravenous Q12H  . Warfarin - Pharmacist Dosing Inpatient   Does not apply q1800    Assessment: 26 YOM admitted 08/14/2015 with newly diagnosed AFlutter with RVR (s/p DCCV on 6/20). Pharmacy consulted to dose coumadin. INR up to 6.36 and Vitamin K 5mg  IV given x 1 (6/24).He is noted with recent a lower GI bleed (Flex sigmoidoscopy 6/23 w/ biopsy) to resume heparin and coumadin (spoke with Dr. [facility]). Will keep on the lower end of goal for heparin and coumadin.  -INR= 1.34  Goal of Therapy:   Heparin level 0.3-0.5 units/ml  INR goal 2-2.5 Monitor platelets by anticoagulation protocol: Yes   Plan:  -restart heparin at 900 units/hr -Heparin level in 8 hours and daily wth CBC daily -Coumadin 1mg  po today -Daily INR  Hildred Laser, Pharm D 08/28/2015 11:35 AM

## 2015-08-28 NOTE — Clinical Social Work Note (Signed)
CSW attempted to contact patient's wife to present bed offers.  CSW was not able to leave a message on patient's phone number.  CSW to attempt again at a later time.  Jones Broom. [place], MSW, [place] 08/28/2015 6:09 PM

## 2015-08-28 NOTE — Care Management Important Message (Signed)
Important Message  Patient Details  Name: Frank Hoover MRN: [id number] Date of Birth: [birth year]   Medicare Important Message Given:  Yes    Loann Quill 08/28/2015, 8:44 AM

## 2015-08-28 NOTE — Progress Notes (Signed)
PT Cancellation Note  Patient Details Name: Frank Hoover MRN: [id number] DOB: 1950-11-21   Cancelled Treatment:    Reason Eval/Treat Not Completed: Medical issues which prohibited therapy. Attempted to see pt at 0905 and again 1305 with HR sustained at 130 with pt at rest. Noted meds being adjusted and ?cardioversion 6/27. Will follow   Disaya Walt 08/28/2015, 3:02 PM Pager [phone]

## 2015-08-28 NOTE — Progress Notes (Signed)
Inpatient Rehabilitation  Pt. With ongoing medical issues and is not yet ready for consideration of IP rehab.  Will continue to follow along.    [place] Admissions Coordinator Cell [phone] Office [phone]

## 2015-08-28 NOTE — Progress Notes (Signed)
Daily Rounding Note  08/28/2015, 8:08 AM  LOS: 14 days   SUBJECTIVE:       No stools, no BPR yest or today.  No abd or rectal pain. Hiccups have been an issue, worse after eating, but none yesterday or today.     OBJECTIVE:         Vital signs in last 24 hours:    Temp:  [98.2 F (36.8 C)-98.8 F (37.1 C)] 98.6 F (37 C) (06/26 0752) Pulse Rate:  [108-127] 127 (06/26 0752) Resp:  [15-24] 20 (06/26 0752) BP: (125-153)/(84-99) 147/88 mmHg (06/26 0752) SpO2:  [97 %-100 %] 97 % (06/26 0752) Weight:  [114.5 kg (252 lb 6.8 oz)] 114.5 kg (252 lb 6.8 oz) (06/26 0300) Last BM Date: 08/26/15 Filed Weights   08/26/15 0500 08/27/15 0117 08/28/15 0300  Weight: 118.434 kg (261 lb 1.6 oz) 117.1 kg (258 lb 2.5 oz) 114.5 kg (252 lb 6.8 oz)   General: obese, non-toxic,  Doesn't look ill.    Heart: RRR Chest: clear bil.   Abdomen: Obese.  NT, ND.  Active BS, NT.  CE belt in place Extremities: no CCE.  S/p  Neuro/Psych:  Pleasant, oriented x 3.    Intake/Output from previous day: 06/25 0701 - 06/26 0700 In: 2094.1 [P.O.:540; I.V.:584.1; Blood:670; IV Piggyback:300] Out: 6568 [Urine:4005]  Intake/Output this shift:    Lab Results:  Recent Labs  08/26/15 1352 08/27/15 0326 08/27/15 1827  WBC 6.3 5.0 7.0  HGB 8.6* 7.5* 11.0*  HCT 27.3* 24.4* 33.6*  PLT 298 292 319   BMET  Recent Labs  08/26/15 0620 08/27/15 0326  NA 136 135  K 3.7 3.2*  CL 105 104  CO2 22 24  GLUCOSE 191* 154*  BUN 31* 23*  CREATININE 2.06* 1.91*  CALCIUM 8.5* 8.0*   LFT No results for input(s): PROT, ALBUMIN, AST, ALT, ALKPHOS, BILITOT, BILIDIR, IBILI in the last 72 hours. PT/INR  Recent Labs  08/26/15 0620 08/27/15 0326  LABPROT 53.9* 16.7*  INR 6.36* 1.34   Hepatitis Panel No results for input(s): HEPBSAG, HCVAB, HEPAIGM, HEPBIGM in the last 72 hours.  Studies/Results: No results found.   Scheduled Meds: . acyclovir ointment    Topical 6 X Daily  . cefTRIAXone (ROCEPHIN)  IV  2 g Intravenous Q24H  . diltiazem  120 mg Oral Daily  . feeding supplement (ENSURE ENLIVE)  237 mL Oral TID BM  . feeding supplement (GLUCERNA SHAKE)  237 mL Oral TID BM  . furosemide  40 mg Oral Daily  . hydrALAZINE  75 mg Oral Q8H  . insulin aspart  0-15 Units Subcutaneous TID WC  . insulin glargine  32 Units Subcutaneous Daily  . isosorbide mononitrate  60 mg Oral Daily  . metoprolol tartrate  75 mg Oral BID  . oxybutynin  5 mg Oral TID  . pantoprazole  40 mg Oral Daily  . sodium chloride flush  3 mL Intravenous Q12H  . vancomycin  1,250 mg Intravenous Q24H  . Warfarin - Pharmacist Dosing Inpatient   Does not apply q1800   Continuous Infusions: . sodium chloride 250 mL (08/27/15 1700)  . sodium chloride Stopped (08/21/15 1617)  . amiodarone 30 mg/hr (08/28/15 0110)  . heparin Stopped (08/27/15 1015)   PRN Meds:.acetaminophen (TYLENOL) oral liquid 160 mg/5 mL, hydrALAZINE, hydrocortisone cream, ipratropium-albuterol, loperamide, metoprolol, ondansetron (ZOFRAN) IV, sodium chloride flush, sodium chloride flush, sodium chloride flush   ASSESMENT:   *  Distal colitis with hematochezia. 6/23 flex sig: friable, ulcerated rectum and sigmoid.  Path pending.  Suspect ischemia. Findings confirm CT findings of proctosigmoiditis.    *  Citrobacter Urosepsis.  On Vanc/Roecphin  *  A flutter, recurrent s/p 6/20 DCCV.  On Heparin, amio, cardizem.  Note INR of 6.3 on 6/24.   *  AKI.    *  PCM, prot cal malnutrition.  Albumin 1.8  *  Anemia. Normocytic.  Whopping response to 2 units PRBC 6/25: Hgb up 3.5 grams at post transfusion assay.   *  Constipation, [place] on PTA med list.   PLAN   *  Await pathology.     Azucena Freed  08/28/2015, 8:08 AM Pager: [phone]   Attending physician's note   I have taken an interval history, reviewed the chart and examined the patient. I agree with the Advanced Practitioner's note, impression  and recommendations.  Patient is feeling better, no abdominal pain and no further episodes of bleeding. Await pathology. Endoscopic findings were suggestive of possible ischemic injury of sigmoid colon.  Advance diet as tolerated Continue supportive care  K Denzil Magnuson, MD [phone] Mon-Fri 8a-5p [phone] after 5p, weekends, holidays

## 2015-08-28 NOTE — Progress Notes (Signed)
Subjective:  No chest pain; breathing better  Objective:   Vital Signs : Filed Vitals:   08/28/15 0416 08/28/15 0539 08/28/15 0752 08/28/15 1144  BP: 131/93 135/94 147/88 139/78  Pulse: 126  127 130  Temp: 98.5 F (36.9 C)  98.6 F (37 C) 98.2 F (36.8 C)  TempSrc: Oral  Oral Oral  Resp: 18  20 19   Height:      Weight:      SpO2: 100%  97% 100%    Intake/Output from previous day:  Intake/Output Summary (Last 24 hours) at 08/28/15 1359 Last data filed at 08/28/15 1300  Gross per 24 hour  Intake 1231.5 ml  Output   1875 ml  Net -643.5 ml    I/O since admission: -7195  Wt Readings from Last 3 Encounters:  08/28/15 252 lb 6.8 oz (114.5 kg)  06/16/15 262 lb (118.842 kg)  06/22/14 265 lb (120.203 kg)    Medications: . acyclovir ointment   Topical 6 X Daily  . cefTRIAXone (ROCEPHIN)  IV  2 g Intravenous Q24H  . diltiazem  120 mg Oral Daily  . feeding supplement (ENSURE ENLIVE)  237 mL Oral TID BM  . furosemide  40 mg Oral Daily  . hydrALAZINE  75 mg Oral Q8H  . insulin aspart  0-15 Units Subcutaneous TID WC  . insulin glargine  32 Units Subcutaneous Daily  . isosorbide mononitrate  60 mg Oral Daily  . metoprolol tartrate  75 mg Oral BID  . oxybutynin  5 mg Oral TID  . pantoprazole  40 mg Oral Daily  . sodium chloride flush  3 mL Intravenous Q12H  . warfarin  1 mg Oral ONCE-1800  . Warfarin - Pharmacist Dosing Inpatient   Does not apply q1800    . sodium chloride 250 mL (08/27/15 1700)  . sodium chloride Stopped (08/21/15 1617)  . amiodarone 30 mg/hr (08/28/15 0110)  . heparin 900 Units/hr (08/28/15 1200)    Physical Exam:   General appearance: no distress Neck: no adenopathy, supple, symmetrical, trachea midline, thyroid not enlarged, symmetric, no tenderness/mass/nodules and JVD 8 cm Lungs: clear to auscultation bilaterally Heart: regular, tachycardic at 353; 1/6 systolic murmur Abdomen: soft, non-tender; bowel sounds normal; no masses,  no  organomegaly Extremities: no edema, redness or tenderness in the calves or thighs Pulses: 2+ and symmetric Skin: Skin color, texture, turgor normal. No rashes or lesions Neurologic: Grossly normal   Rate: 128  Rhythm: atrial flutter  08/26/15 ECG (independently read by me): Accelerated junctional at 120; Q's 3, aVF. Increased QTc.  Will re-check today  Lab Results:   Recent Labs  08/26/15 0620 08/27/15 0326 08/28/15 1141  NA 136 135 137  K 3.7 3.2* 3.8  CL 105 104 106  CO2 22 24 23   GLUCOSE 191* 154* 202*  BUN 31* 23* 18  CREATININE 2.06* 1.91* 1.78*  CALCIUM 8.5* 8.0* 8.4*  MG  --   --  1.6*  PHOS  --   --  2.5    Hepatic Function Latest Ref Rng 08/25/2015 08/18/2015 08/17/2015  Total Protein 6.5 - 8.1 g/dL 5.4(L) - 4.5(L)  Albumin 3.5 - 5.0 g/dL 1.8(L) 1.5(L) 1.8(L)  AST 15 - 41 U/L 38 - 109(H)  ALT 17 - 63 U/L 41 - 89(H)  Alk Phosphatase 38 - 126 U/L 57 - 80  Total Bilirubin 0.3 - 1.2 mg/dL 0.1(L) - 1.1  Bilirubin, Direct 0.1 - 0.5 mg/dL 0.1 - -     Recent Labs  08/27/15 0326 08/27/15 1827 08/28/15 1251  WBC 5.0 7.0 6.9  HGB 7.5* 11.0* 11.9*  HCT 24.4* 33.6* 36.2*  MCV 89.7 84.0 84.6  PLT 292 319 296    No results for input(s): TROPONINI in the last 72 hours.  Invalid input(s): CK, MB  Lab Results  Component Value Date   TSH 1.917 08/14/2015   No results for input(s): HGBA1C in the last 72 hours.  No results for input(s): PROT, ALBUMIN, AST, ALT, ALKPHOS, BILITOT, BILIDIR, IBILI in the last 72 hours.  Recent Labs  08/28/15 1251  INR 1.40   BNP (last 3 results)  Recent Labs  08/14/15 1501  BNP 609.0*    ProBNP (last 3 results) No results for input(s): PROBNP in the last 8760 hours.   Lipid Panel     Component Value Date/Time   CHOL 79 08/15/2015 0422   TRIG 38 08/15/2015 0422   HDL 34* 08/15/2015 0422   CHOLHDL 2.3 08/15/2015 0422   VLDL 8 08/15/2015 0422   LDLCALC 37 08/15/2015 0422      Imaging:  No results  found.    Assessment/Plan:   Principal Problem:   Atrial flutter with rapid ventricular response (HCC) Active Problems:   GERD   DM (diabetes mellitus) (HCC)   HTN (hypertension)   Hyperlipemia   Sleep apnea   Systolic CHF ([place])   Acute respiratory failure (HCC)   Septic shock (HCC)   Acute encephalopathy   Acute kidney injury ([place])   Acute systolic heart failure (HCC)   SVT (supraventricular tachycardia) ([place])   Debility   Blood in stool   Acute blood loss anemia   Abnormal CT scan, colon   Proctosigmoiditis   Lower GI bleed   25M with history of CKD stage III, COPD, HTN, gout, DM admitted after presenting for an echocardiogram ordered by his nephrologist secondary to elevated heart rate and leg edema. He then was referred to the ER after call placed to Dr. Lowanda Foster reporting patient heart rate in the 120s and associated dizziness. He was found to be in newly recognized atrial flutter RVR of unknown duration, acute cardiogenic/septic shock requiring pressors & IV abx (citrobacter bacteremia), acute respiratory failure requiring ventilation, EF 25%. Ischemic workup not yet pursued due to AKI on CKD with Cr peak over 5. Elevated flat troponin 6/12-6/14. Atrial flutter RVR persisted despite amiodarone loading thus underwent TEE/DCCV 08/22/15 with successful conversion to NSR. On (08/23/15) was noted to go into rapid SVT HR 170-180s with associated chest discomfort and fall in BP. Responded once to carotid massage and a second time to IV adenosine.   1. Acute cardiogenic/septic shock and acute respiratory failure - off pressors, off vent, BP stable; currently 133/84  2. Acute systolic CHF - continues to diurese well on po lasix. Dose reduced to 40 mg daily. I/O since admission -7195;  Wt 271 --> 252 today  On oral hydralazine and nitrates, BB.  Repeat Echo in 2 months to reassess EF.   3. Atrial flutter RVR - s/p TEE/DCCV 08/22/15. Now back in atrial flutter since yesterday,  ventricular rate 150-160, now down to 130 BPM on iv amiodarone and oral metoprolol. Cardizem CD 120 mg po added yesterday. - On heparin;  INR today is 1.40 HR 120;  Will titrate lopressor to 100 mg bid tomorrow if rate still increased. -If he doesn't convert on his own, consider another DCCV once he is loaded with amiodarone, possibly Tuesday  4. AKI on CKD stage III - Cr  continues to gradually improve.  Cr today 1.74 improved from 2.06 on 6/24.  5. Progressive anemia - H/H better now at 11.9/36.2.   6. Hypokalemia- repleted  7. HTN- better today on increased med Rx.   Troy Sine, MD, [facility] 08/28/2015, 1:59 PM

## 2015-08-28 NOTE — Progress Notes (Addendum)
PROGRESS NOTE    Frank Hoover  SVX:[id number] DOB: 01-10-1951 DOA: 08/14/2015 PCP: Alonza Bogus, MD    Brief Narrative:  65 year old male was originally seen by his nephrologist in [place], was found to be in A. fib with RVR , admitted to any pain on 08/15/15 , noted to be in a flutter with RVR which is new diagnosis, 2-D echo showing EF 25%, and developed septic and cardiogenic shock, febrile 104, transferred to PCCM at [facility], treated for  Bacteremia due to UTI, requiring  pressor support, he required intubation by PCCM, successfully extubated on 6/19 Much improved and was transferred to [facility] on 6/20.  Patient was seen by cardiology and EP for atrial flutter, and SVT,initially  on heparin drip, transitioned to by mouth warfarin, with significant anemia, Hemoccult-positive stool, seen by GI, S/P Flex Sig, suspicious for ischemic colitis.    Assessment & Plan:   Principal Problem:   Atrial flutter with rapid ventricular response (HCC) Active Problems:   GERD   DM (diabetes mellitus) ([place])   HTN (hypertension)   Hyperlipemia   Sleep apnea   Systolic CHF ([place])   Acute respiratory failure (HCC)   Septic shock (HCC)   Acute encephalopathy   Acute kidney injury ([place])   Acute systolic heart failure (HCC)   SVT (supraventricular tachycardia) ([place])   Debility   Blood in stool   Acute blood loss anemia   Abnormal CT scan, colon   Proctosigmoiditis   Lower GI bleed   Acute hypoxic resp failure - With history of OSA, COPD, and cardiogenic/septic shock - Bipap Qhs for sleep apnea - improving, currently on 2 L nasal cannula  Septic shock with citrobacter bacteremia  - Shock resolved  Citrobacter bacteremia in the setting of UTI - Blood cultures and urine culture growing Citrobacter, treated with total of 8 days of Antibiotics, resumed Rocephin 6/22 for total of 2 weeks treatment (stop date 6/28) -  repeat blood cultures 6/22: One bottle out of 2 sets growing  empirically on IV vancomycin, culture growing micrococcus species, most likely contamination, discontinue IV vancomycin   Lower GI bleed - most likely in the setting of ischemic colitis, status post flexible sigmoidoscopy on 6/23, follow on biopsies, continue to monitor hemoglobin closely and transfuse as needed. - hemoglobin dropped to 7.5 on 6/25, transfused 2 units PRBC, with a myoglobin today is 11, - continue  with heparin GTT for anticoagulation, will try to keep therapeutic range on the lower side, resumed on warfarin as discussed with GI.  Supratherapeutic INR/coagulopathy - Secondary to warfarin - INR 6.3 on 6/24, received 5 mg of IV vitamin K  HCAP - Treated  Cardiogenic shock/acute  systolic CHF with EF 23% - Currently off pressors, management per cardiology, on beta blockers and hydralazine - Diuresis per cardiology, appears to be improving, not on ACEI/ARB/spiral due to ARF.  Atrial fib - Management per cardiology, status post s/p  TEE/DCCV 08/22/15 - On anticoagulation, continue with heparin GTT, keep on the lower side monitor closely H&H - Back in a flutter , back on amiodarone drip, continue with oral metoprolol, started on Cardizem CD.  Episode of SVT on 08/23/18 - required Cardizem as such, IV adenosine, EP consult appreciated  HTN - Blood pressure acceptable, clonidine wean down, started on metoprolol and cardizem.  AKI on CKD  - Continue to monitor, improving despite diuresis  Anemia  - Multifactorial, setting of chronic kidney disease, chronic illness, and GI bleed, monitor closely and transfuse as needed  DM  - Better controlled after increasing Lantus, continue with insulin sliding scale .  Urinary retention -foley per urology, to remain for several weeks to allow urethra healing better urology recommendation.  Acute metabolic encephalopathy - Improving    DVT prophylaxis:  Back on heaprin GTT  Code Status: Full Code   Family  Communication: none at bedside  Disposition Plan:  Remain in SDU, seen by CIR,    Consultants:   Cards  PCCM  Urology  Inpatient rehabilitation  Procedures:  6/13 R Femoral CVL >> 6/13 6/13 ETT >> 6/18 6/13 Lt radial aline >> 6/17 6/13 RIJ CVL >>>plan out 6/19 6/25 2 units PRBC transfusion  Subjective: Feeling better, report , at baseline, denies chest pain, post feeling much better today, no further episodes of hematochezia or blood in stool.  Objective: Filed Vitals:   08/28/15 0300 08/28/15 0416 08/28/15 0539 08/28/15 0752  BP:  131/93 135/94 147/88  Pulse: 126 126  127  Temp:  98.5 F (36.9 C)  98.6 F (37 C)  TempSrc:  Oral  Oral  Resp: 18 18  20   Height:      Weight: 114.5 kg (252 lb 6.8 oz)     SpO2: 99% 100%  97%    Intake/Output Summary (Last 24 hours) at 08/28/15 1021 Last data filed at 08/28/15 0800  Gross per 24 hour  Intake 1657.4 ml  Output   3105 ml  Net -1447.6 ml   Filed Weights   08/26/15 0500 08/27/15 0117 08/28/15 0300  Weight: 118.434 kg (261 lb 1.6 oz) 117.1 kg (258 lb 2.5 oz) 114.5 kg (252 lb 6.8 oz)    Examination:  General exam: Awake, alert,  Respiratory system: diminshed, no wheezing, Respiratory effort normal. Cardiovascular system: S1 & S2 heard,Irregular, tachycardic, murmurs, rubs, gallops or clicks. No pedal edema. Gastrointestinal system: Abdomen is nondistended, soft and nontender, obese. Lower extremity: + edema, pulses felt bilaterally     Data Reviewed: I have personally reviewed following labs and imaging studies  CBC:  Recent Labs Lab 08/25/15 0423 08/26/15 0620 08/26/15 1352 08/27/15 0326 08/27/15 1827  WBC 8.1 6.8 6.3 5.0 7.0  HGB 8.4* 9.0* 8.6* 7.5* 11.0*  HCT 26.4* 28.9* 27.3* 24.4* 33.6*  MCV 86.8 87.8 87.2 89.7 84.0  PLT 239 290 298 292 272   Basic Metabolic Panel:  Recent Labs Lab 08/23/15 0250 08/24/15 0247 08/25/15 0423 08/26/15 0620 08/27/15 0326  NA 138 135 136 136 135  K 3.6  3.1* 3.8 3.7 3.2*  CL 102 102 106 105 104  CO2 27 25 25 22 24   GLUCOSE 248* 161* 170* 191* 154*  BUN 73* 61* 47* 31* 23*  CREATININE 3.02* 2.73* 2.49* 2.06* 1.91*  CALCIUM 8.1* 8.0* 8.1* 8.5* 8.0*  MG  --  1.8  --   --   --    GFR: Estimated Creatinine Clearance: 47.1 mL/min (by C-G formula based on Cr of 1.91). Liver Function Tests:  Recent Labs Lab 08/25/15 0423  AST 38  ALT 41  ALKPHOS 57  BILITOT 0.1*  PROT 5.4*  ALBUMIN 1.8*   No results for input(s): LIPASE, AMYLASE in the last 168 hours. No results for input(s): AMMONIA in the last 168 hours. Coagulation Profile:  Recent Labs Lab 08/23/15 0853 08/24/15 0247 08/25/15 0423 08/26/15 0620 08/27/15 0326  INR 1.36 1.70* 3.69* 6.36* 1.34   Cardiac Enzymes: No results for input(s): CKTOTAL, CKMB, CKMBINDEX, TROPONINI in the last 168 hours. BNP (last 3 results) No results for  input(s): PROBNP in the last 8760 hours. HbA1C: No results for input(s): HGBA1C in the last 72 hours. CBG:  Recent Labs Lab 08/27/15 0800 08/27/15 1248 08/27/15 1807 08/27/15 2108 08/28/15 0755  GLUCAP 159* 250* 130* 137* 169*   Lipid Profile: No results for input(s): CHOL, HDL, LDLCALC, TRIG, CHOLHDL, LDLDIRECT in the last 72 hours. Thyroid Function Tests: No results for input(s): TSH, T4TOTAL, FREET4, T3FREE, THYROIDAB in the last 72 hours. Anemia Panel: No results for input(s): VITAMINB12, FOLATE, FERRITIN, TIBC, IRON, RETICCTPCT in the last 72 hours. Urine analysis:    Component Value Date/Time   COLORURINE AMBER* 08/14/2015 2125   APPEARANCEUR HAZY* 08/14/2015 2125   LABSPEC 1.015 08/14/2015 2125   PHURINE 5.0 08/14/2015 2125   GLUCOSEU 100* 08/14/2015 2125   HGBUR LARGE* 08/14/2015 2125   BILIRUBINUR NEGATIVE 08/14/2015 2125   KETONESUR NEGATIVE 08/14/2015 2125   PROTEINUR 100* 08/14/2015 2125   UROBILINOGEN 0.2 12/02/2013 1739   NITRITE NEGATIVE 08/14/2015 2125   LEUKOCYTESUR MODERATE* 08/14/2015 2125   Sepsis  Labs: @LABRCNTIP (procalcitonin:4,lacticidven:4)  ) Recent Results (from the past 240 hour(s))  C difficile quick scan w PCR reflex     Status: None   Collection Time: 08/23/15  4:14 PM  Result Value Ref Range Status   C Diff antigen NEGATIVE NEGATIVE Final   C Diff toxin NEGATIVE NEGATIVE Final   C Diff interpretation Negative for toxigenic C. difficile  Final  Gastrointestinal Panel by PCR , Stool     Status: None   Collection Time: 08/24/15 11:29 AM  Result Value Ref Range Status   Campylobacter species NOT DETECTED NOT DETECTED Final   Plesimonas shigelloides NOT DETECTED NOT DETECTED Final   Salmonella species NOT DETECTED NOT DETECTED Final   Yersinia enterocolitica NOT DETECTED NOT DETECTED Final   Vibrio species NOT DETECTED NOT DETECTED Final   Vibrio cholerae NOT DETECTED NOT DETECTED Final   Enteroaggregative E coli (EAEC) NOT DETECTED NOT DETECTED Final   Enteropathogenic E coli (EPEC) NOT DETECTED NOT DETECTED Final   Enterotoxigenic E coli (ETEC) NOT DETECTED NOT DETECTED Final   Shiga like toxin producing E coli (STEC) NOT DETECTED NOT DETECTED Final   E. coli O157 NOT DETECTED NOT DETECTED Final   Shigella/Enteroinvasive E coli (EIEC) NOT DETECTED NOT DETECTED Final   Cryptosporidium NOT DETECTED NOT DETECTED Final   Cyclospora cayetanensis NOT DETECTED NOT DETECTED Final   Entamoeba histolytica NOT DETECTED NOT DETECTED Final   Giardia lamblia NOT DETECTED NOT DETECTED Final   Adenovirus F40/41 NOT DETECTED NOT DETECTED Final   Astrovirus NOT DETECTED NOT DETECTED Final   Norovirus GI/GII NOT DETECTED NOT DETECTED Final   Rotavirus A NOT DETECTED NOT DETECTED Final   Sapovirus (I, II, IV, and V) NOT DETECTED NOT DETECTED Final  Culture, blood (Routine X 2) w Reflex to ID Panel     Status: None (Preliminary result)   Collection Time: 08/24/15 12:20 PM  Result Value Ref Range Status   Specimen Description BLOOD LEFT HAND  Final   Special Requests BOTTLES DRAWN  AEROBIC ONLY 10CC  Final   Culture NO GROWTH 3 DAYS  Final   Report Status PENDING  Incomplete  Culture, blood (Routine X 2) w Reflex to ID Panel     Status: Abnormal   Collection Time: 08/24/15 12:40 PM  Result Value Ref Range Status   Specimen Description BLOOD LEFT HAND  Final   Special Requests IN PEDIATRIC BOTTLE 2.5CC  Final   Culture  Setup Time  Final    GRAM POSITIVE COCCI IN CLUSTERS AEROBIC BOTTLE ONLY CRITICAL RESULT CALLED TO, READ BACK BY AND VERIFIED WITH: JAMES LEDFORD,PHARMD @0113  08/26/15 MKELLY    Culture (A)  Final    MICROCOCCUS SPECIES Standardized susceptibility testing for this organism is not available.    Report Status 08/28/2015 FINAL  Final  Blood Culture ID Panel (Reflexed)     Status: None   Collection Time: 08/24/15 12:40 PM  Result Value Ref Range Status   Enterococcus species NOT DETECTED NOT DETECTED Final   Vancomycin resistance NOT DETECTED NOT DETECTED Final   Listeria monocytogenes NOT DETECTED NOT DETECTED Final   Staphylococcus species NOT DETECTED NOT DETECTED Final   Staphylococcus aureus NOT DETECTED NOT DETECTED Final   Methicillin resistance NOT DETECTED NOT DETECTED Final   Streptococcus species NOT DETECTED NOT DETECTED Final   Streptococcus agalactiae NOT DETECTED NOT DETECTED Final   Streptococcus pneumoniae NOT DETECTED NOT DETECTED Final   Streptococcus pyogenes NOT DETECTED NOT DETECTED Final   Acinetobacter baumannii NOT DETECTED NOT DETECTED Final   Enterobacteriaceae species NOT DETECTED NOT DETECTED Final   Enterobacter cloacae complex NOT DETECTED NOT DETECTED Final   Escherichia coli NOT DETECTED NOT DETECTED Final   Klebsiella oxytoca NOT DETECTED NOT DETECTED Final   Klebsiella pneumoniae NOT DETECTED NOT DETECTED Final   Proteus species NOT DETECTED NOT DETECTED Final   Serratia marcescens NOT DETECTED NOT DETECTED Final   Carbapenem resistance NOT DETECTED NOT DETECTED Final   Haemophilus influenzae NOT DETECTED  NOT DETECTED Final   Neisseria meningitidis NOT DETECTED NOT DETECTED Final   Pseudomonas aeruginosa NOT DETECTED NOT DETECTED Final   Candida albicans NOT DETECTED NOT DETECTED Final   Candida glabrata NOT DETECTED NOT DETECTED Final   Candida krusei NOT DETECTED NOT DETECTED Final   Candida parapsilosis NOT DETECTED NOT DETECTED Final   Candida tropicalis NOT DETECTED NOT DETECTED Final      Anti-infectives    Start     Dose/Rate Route Frequency Ordered Stop   08/27/15 1100  vancomycin (VANCOCIN) 1,250 mg in sodium chloride 0.9 % 250 mL IVPB     1,250 mg 166.7 mL/hr over 90 Minutes Intravenous Every 24 hours 08/27/15 0842     08/26/15 0130  vancomycin (VANCOCIN) 1,500 mg in sodium chloride 0.9 % 500 mL IVPB  Status:  Discontinued     1,500 mg 250 mL/hr over 120 Minutes Intravenous Every 48 hours 08/26/15 0121 08/27/15 0841   08/24/15 1200  cefTRIAXone (ROCEPHIN) 2 g in dextrose 5 % 50 mL IVPB     2 g 100 mL/hr over 30 Minutes Intravenous Every 24 hours 08/24/15 1055     08/20/15 0000  cefTRIAXone (ROCEPHIN) 2 g in dextrose 5 % 50 mL IVPB  Status:  Discontinued     2 g 100 mL/hr over 30 Minutes Intravenous Every 24 hours 08/19/15 1112 08/22/15 1310   08/18/15 1100  cefTAZidime (FORTAZ) 2 g in dextrose 5 % 50 mL IVPB  Status:  Discontinued     2 g 100 mL/hr over 30 Minutes Intravenous Every 24 hours 08/18/15 1039 08/19/15 1112   08/17/15 1800  vancomycin (VANCOCIN) 1,500 mg in sodium chloride 0.9 % 500 mL IVPB  Status:  Discontinued     1,500 mg 250 mL/hr over 120 Minutes Intravenous Every 48 hours 08/15/15 1636 08/15/15 1956   08/16/15 2100  ceFEPIme (MAXIPIME) 1 g in dextrose 5 % 50 mL IVPB  Status:  Discontinued     1  g 100 mL/hr over 30 Minutes Intravenous Every 24 hours 08/15/15 1957 08/16/15 1415   08/16/15 2100  ceFEPIme (MAXIPIME) 2 g in dextrose 5 % 50 mL IVPB  Status:  Discontinued     2 g 100 mL/hr over 30 Minutes Intravenous Every 24 hours 08/16/15 1415 08/16/15 1741    08/16/15 1900  piperacillin-tazobactam (ZOSYN) IVPB 2.25 g  Status:  Discontinued     2.25 g 100 mL/hr over 30 Minutes Intravenous Every 8 hours 08/16/15 1809 08/18/15 1014   08/15/15 2100  ceFEPIme (MAXIPIME) 2 g in dextrose 5 % 50 mL IVPB     2 g 100 mL/hr over 30 Minutes Intravenous  Once 08/15/15 1957 08/15/15 2359   08/15/15 1730  vancomycin (VANCOCIN) 2,000 mg in sodium chloride 0.9 % 500 mL IVPB     2,000 mg 250 mL/hr over 120 Minutes Intravenous  Once 08/15/15 1636 08/15/15 2000   08/15/15 0900  piperacillin-tazobactam (ZOSYN) IVPB 3.375 g  Status:  Discontinued     3.375 g 12.5 mL/hr over 240 Minutes Intravenous Every 8 hours 08/15/15 0856 08/15/15 1956   08/15/15 0100  vancomycin (VANCOCIN) IVPB 1000 mg/200 mL premix     1,000 mg 200 mL/hr over 60 Minutes Intravenous STAT 08/15/15 0048 08/15/15 0206   08/14/15 2330  vancomycin (VANCOCIN) IVPB 1000 mg/200 mL premix     1,000 mg 200 mL/hr over 60 Minutes Intravenous STAT 08/14/15 2316 08/15/15 0041   08/14/15 2330  piperacillin-tazobactam (ZOSYN) IVPB 3.375 g     3.375 g 12.5 mL/hr over 240 Minutes Intravenous STAT 08/14/15 2317 08/15/15 0341       Radiology Studies: No results found.      Scheduled Meds: . acyclovir ointment   Topical 6 X Daily  . cefTRIAXone (ROCEPHIN)  IV  2 g Intravenous Q24H  . diltiazem  120 mg Oral Daily  . feeding supplement (ENSURE ENLIVE)  237 mL Oral TID BM  . feeding supplement (GLUCERNA SHAKE)  237 mL Oral TID BM  . furosemide  40 mg Oral Daily  . hydrALAZINE  75 mg Oral Q8H  . insulin aspart  0-15 Units Subcutaneous TID WC  . insulin glargine  32 Units Subcutaneous Daily  . isosorbide mononitrate  60 mg Oral Daily  . metoprolol tartrate  75 mg Oral BID  . oxybutynin  5 mg Oral TID  . pantoprazole  40 mg Oral Daily  . sodium chloride flush  3 mL Intravenous Q12H  . vancomycin  1,250 mg Intravenous Q24H  . Warfarin - Pharmacist Dosing Inpatient   Does not apply q1800    Continuous Infusions: . sodium chloride 250 mL (08/27/15 1700)  . sodium chloride Stopped (08/21/15 1617)  . amiodarone 30 mg/hr (08/28/15 0110)  . heparin Stopped (08/27/15 1015)     LOS: 14 days    Time spent: 25 min    ELGERGAWY, DAWOOD, MD [facility] Pager [phone]  If 7PM-7AM, please contact night-coverage [email or web address] Password [facility] 08/28/2015, 10:21 AM

## 2015-08-29 DIAGNOSIS — K559 Vascular disorder of intestine, unspecified: Secondary | ICD-10-CM | POA: Diagnosis present

## 2015-08-29 DIAGNOSIS — R7881 Bacteremia: Secondary | ICD-10-CM | POA: Diagnosis present

## 2015-08-29 LAB — CBC
HCT: 32.7 % — ABNORMAL LOW (ref 39.0–52.0)
Hemoglobin: 10.7 g/dL — ABNORMAL LOW (ref 13.0–17.0)
MCH: 27.6 pg (ref 26.0–34.0)
MCHC: 32.7 g/dL (ref 30.0–36.0)
MCV: 84.3 fL (ref 78.0–100.0)
Platelets: 308 10*3/uL (ref 150–400)
RBC: 3.88 MIL/uL — ABNORMAL LOW (ref 4.22–5.81)
RDW: 18 % — ABNORMAL HIGH (ref 11.5–15.5)
WBC: 6.3 10*3/uL (ref 4.0–10.5)

## 2015-08-29 LAB — BASIC METABOLIC PANEL
Anion gap: 9 (ref 5–15)
BUN: 18 mg/dL (ref 6–20)
CO2: 26 mmol/L (ref 22–32)
Calcium: 8.4 mg/dL — ABNORMAL LOW (ref 8.9–10.3)
Chloride: 103 mmol/L (ref 101–111)
Creatinine, Ser: 1.94 mg/dL — ABNORMAL HIGH (ref 0.61–1.24)
GFR calc Af Amer: 41 mL/min — ABNORMAL LOW (ref >60)
GFR calc non Af Amer: 35 mL/min — ABNORMAL LOW (ref >60)
Glucose, Bld: 193 mg/dL — ABNORMAL HIGH (ref 65–99)
Potassium: 3.6 mmol/L (ref 3.5–5.1)
Sodium: 138 mmol/L (ref 135–145)

## 2015-08-29 LAB — PROTIME-INR
INR: 1.43 (ref 0.00–1.49)
Prothrombin Time: 17.6 seconds — ABNORMAL HIGH (ref 11.6–15.2)

## 2015-08-29 LAB — GLUCOSE, CAPILLARY
Glucose-Capillary: 198 mg/dL — ABNORMAL HIGH (ref 65–99)
Glucose-Capillary: 353 mg/dL — ABNORMAL HIGH (ref 65–99)
Glucose-Capillary: 91 mg/dL (ref 65–99)
Glucose-Capillary: 170 mg/dL — ABNORMAL HIGH (ref 65–99)

## 2015-08-29 LAB — CULTURE, BLOOD (ROUTINE X 2): Culture: NO GROWTH

## 2015-08-29 LAB — HEPARIN LEVEL (UNFRACTIONATED)
Heparin Unfractionated: <0.1 IU/mL — ABNORMAL LOW (ref 0.30–0.70)
Heparin Unfractionated: <0.1 IU/mL — ABNORMAL LOW (ref 0.30–0.70)

## 2015-08-29 MED ORDER — WARFARIN SODIUM 1 MG PO TABS
1.0000 mg | ORAL_TABLET | Freq: Once | ORAL | Status: AC
  Administered 2015-08-29: 1 mg via ORAL
  Filled 2015-08-29: qty 0.5
  Filled 2015-08-29: qty 1

## 2015-08-29 MED ORDER — MORPHINE SULFATE (PF) 2 MG/ML IV SOLN
2.0000 mg | Freq: Once | INTRAVENOUS | Status: AC
  Administered 2015-08-29: 2 mg via INTRAVENOUS
  Filled 2015-08-29: qty 1

## 2015-08-29 MED ORDER — METOPROLOL TARTRATE 50 MG PO TABS
100.0000 mg | ORAL_TABLET | Freq: Two times a day (BID) | ORAL | Status: DC
  Administered 2015-08-29 – 2015-08-31 (×4): 100 mg via ORAL
  Filled 2015-08-29 (×4): qty 2

## 2015-08-29 MED ORDER — DILTIAZEM HCL 60 MG PO TABS
60.0000 mg | ORAL_TABLET | Freq: Four times a day (QID) | ORAL | Status: DC
  Administered 2015-08-29 – 2015-09-02 (×15): 60 mg via ORAL
  Filled 2015-08-29 (×15): qty 1

## 2015-08-29 MED ORDER — COLLAGENASE 250 UNIT/GM EX OINT
TOPICAL_OINTMENT | Freq: Every day | CUTANEOUS | Status: DC
  Administered 2015-08-29: 14:00:00 via TOPICAL
  Administered 2015-08-30: 1 via TOPICAL
  Administered 2015-08-31 – 2015-09-02 (×2): via TOPICAL
  Administered 2015-09-03 – 2015-09-04 (×2): 1 via TOPICAL
  Administered 2015-09-05: 11:00:00 via TOPICAL
  Administered 2015-09-06 – 2015-09-08 (×3): 1 via TOPICAL
  Filled 2015-08-29 (×3): qty 30

## 2015-08-29 MED ORDER — HYDROCODONE-ACETAMINOPHEN 5-325 MG PO TABS
1.0000 | ORAL_TABLET | ORAL | Status: DC | PRN
  Administered 2015-08-30 – 2015-09-08 (×18): 1 via ORAL
  Filled 2015-08-29 (×19): qty 1

## 2015-08-29 MED ORDER — PROCHLORPERAZINE EDISYLATE 5 MG/ML IJ SOLN
5.0000 mg | Freq: Once | INTRAMUSCULAR | Status: DC
  Filled 2015-08-29: qty 1

## 2015-08-29 NOTE — Consult Note (Signed)
   [facility] CM Inpatient Consult   08/29/2015  Frank Hoover 06/10/1950 [id number]  Patient screened for potential [place] Management services. Patient is eligible for [place]. Patient is 65 year old male per medical record was originally seen by his nephrologist in [place], was found to be in A. fib with RVR , admitted to any pain on 08/15/15 , noted to be in a flutter with RVR which is new diagnosis, 2-D echo showing EF 25%, and developed septic and cardiogenic shock, febrile 104, transferred to PCCM at [facility], treated forBacteremia due to UTI,  HF and heart rate control.  Electronic medical record reveals patient's discharge plan is for Skilled Facility verses Inpatient Rehab. [facility] Care Management services not appropriate at this time. If patient's post hospital needs change please place a [facility] Care Management consult. For questions please contact:   Janci Minor RN, [facility] Liaison  [phone]) Business Mobile [phone]) Toll free office

## 2015-08-29 NOTE — Progress Notes (Signed)
Subjective:  No chest pain; breathing better  Objective:   Vital Signs : Filed Vitals:   08/29/15 0600 08/29/15 0655 08/29/15 0751 08/29/15 0800  BP: 136/92 136/92  145/99  Pulse: 130   131  Temp:   98.5 F (36.9 C)   TempSrc:      Resp: 22   22  Height:      Weight:      SpO2: 95%   95%    Intake/Output from previous day:  Intake/Output Summary (Last 24 hours) at 08/29/15 0924 Last data filed at 08/29/15 0900  Gross per 24 hour  Intake 1537.1 ml  Output   1458 ml  Net   79.1 ml    I/O since admission: -7342  Wt Readings from Last 3 Encounters:  08/29/15 254 lb 10.1 oz (115.5 kg)  06/16/15 262 lb (118.842 kg)  06/22/14 265 lb (120.203 kg)    Medications: . acyclovir ointment   Topical 6 X Daily  . cefTRIAXone (ROCEPHIN)  IV  2 g Intravenous Q24H  . diltiazem  120 mg Oral Daily  . feeding supplement (ENSURE ENLIVE)  237 mL Oral TID BM  . furosemide  40 mg Oral Daily  . hydrALAZINE  75 mg Oral Q8H  . insulin aspart  0-15 Units Subcutaneous TID WC  . insulin glargine  32 Units Subcutaneous Daily  . isosorbide mononitrate  60 mg Oral Daily  . metoprolol tartrate  75 mg Oral BID  . oxybutynin  5 mg Oral TID  . pantoprazole  40 mg Oral Daily  . prochlorperazine  5 mg Intravenous Once  . sodium chloride flush  3 mL Intravenous Q12H  . Warfarin - Pharmacist Dosing Inpatient   Does not apply q1800    . sodium chloride Stopped (08/29/15 0000)  . sodium chloride Stopped (08/21/15 1617)  . amiodarone 30 mg/hr (08/29/15 0427)  . heparin 900 Units/hr (08/29/15 0000)    Physical Exam:   General appearance: no distress Neck: no adenopathy, supple, symmetrical, trachea midline, thyroid not enlarged, symmetric, no tenderness/mass/nodules and JVD 8 cm Lungs: clear to auscultation bilaterally Heart: regular, tachycardic at 572; 1/6 systolic murmur Abdomen: soft, non-tender; bowel sounds normal; no masses,  no organomegaly Extremities: no edema, redness or  tenderness in the calves or thighs Pulses: 2+ and symmetric Skin: Skin color, texture, turgor normal. No rashes or lesions Neurologic: Grossly normal   Rate: 131  Rhythm: atrial flutter  08/26/15 ECG (independently read by me): Accelerated junctional at 120; Q's 3, aVF. Increased QTc.  Will re-check today  Lab Results:   Recent Labs  08/27/15 0326 08/28/15 1141 08/29/15 0600  NA 135 137 138  K 3.2* 3.8 3.6  CL 104 106 103  CO2 _0 GLUCOSE 154* 202* 193*  BUN 23* 18 18  CREATININE 1.91* 1.78* 1.94*  CALCIUM 8.0* 8.4* 8.4*  MG  --  1.6*  --   PHOS  --  2.5  --     Hepatic Function Latest Ref Rng 08/25/2015 08/18/2015 08/17/2015  Total Protein 6.5 - 8.1 g/dL 5.4(L) - 4.5(L)  Albumin 3.5 - 5.0 g/dL 1.8(L) 1.5(L) 1.8(L)  AST 15 - 41 U/L 38 - 109(H)  ALT 17 - 63 U/L 41 - 89(H)  Alk Phosphatase 38 - 126 U/L 57 - 80  Total Bilirubin 0.3 - 1.2 mg/dL 0.1(L) - 1.1  Bilirubin, Direct 0.1 - 0.5 mg/dL 0.1 - -     Recent Labs  08/27/15 1827 08/28/15 1251 08/29/15  0600  WBC 7.0 6.9 6.3  HGB 11.0* 11.9* 10.7*  HCT 33.6* 36.2* 32.7*  MCV 84.0 84.6 84.3  PLT 319 296 308    No results for input(s): TROPONINI in the last 72 hours.  Invalid input(s): CK, MB  Lab Results  Component Value Date   TSH 1.917 08/14/2015   No results for input(s): HGBA1C in the last 72 hours.  No results for input(s): PROT, ALBUMIN, AST, ALT, ALKPHOS, BILITOT, BILIDIR, IBILI in the last 72 hours.  Recent Labs  08/29/15 0600  INR 1.43   BNP (last 3 results)  Recent Labs  08/14/15 1501  BNP 609.0*    ProBNP (last 3 results) No results for input(s): PROBNP in the last 8760 hours.   Lipid Panel     Component Value Date/Time   CHOL 79 08/15/2015 0422   TRIG 38 08/15/2015 0422   HDL 34* 08/15/2015 0422   CHOLHDL 2.3 08/15/2015 0422   VLDL 8 08/15/2015 0422   LDLCALC 37 08/15/2015 0422      Imaging:  No results found.    Assessment/Plan:   Principal Problem:    Atrial flutter with rapid ventricular response (HCC) Active Problems:   GERD   DM (diabetes mellitus) (HCC)   HTN (hypertension)   Hyperlipemia   Sleep apnea   Systolic CHF ([place])   Acute respiratory failure (HCC)   Septic shock (HCC)   Acute encephalopathy   Acute kidney injury ([place])   Acute systolic heart failure (HCC)   SVT (supraventricular tachycardia) ([place])   Debility   Blood in stool   Acute blood loss anemia   Abnormal CT scan, colon   Proctosigmoiditis   Lower GI bleed   Noninfectious gastroenteritis, unspecified   61M with history of CKD stage III, COPD, HTN, gout, DM admitted after presenting for an echocardiogram ordered by his nephrologist secondary to elevated heart rate and leg edema. He then was referred to the ER after call placed to Dr. Lowanda Foster reporting patient heart rate in the 120s and associated dizziness. He was found to be in newly recognized atrial flutter RVR of unknown duration, acute cardiogenic/septic shock requiring pressors & IV abx (citrobacter bacteremia), acute respiratory failure requiring ventilation, EF 25%. Ischemic workup not yet pursued due to AKI on CKD with Cr peak over 5. Elevated flat troponin 6/12-6/14. Atrial flutter RVR persisted despite amiodarone loading thus underwent TEE/DCCV 08/22/15 with successful conversion to NSR. On (08/23/15) was noted to go into rapid SVT HR 170-180s with associated chest discomfort and fall in BP. Responded once to carotid massage and a second time to IV adenosine.   1. Acute cardiogenic/septic shock and acute respiratory failure - off pressors, off vent.  BP today is 145/99  2. Acute systolic CHF - continues to diurese well on po lasix.  I/O since admission -7342;  Wt 271 --> 252 yesterday and 254 today. On oral hydralazine/ nitrates/ BB.  Repeat Echo in 2 months to reassess EF.   3. Atrial flutter RVR - s/p TEE/DCCV 08/22/15. Back in atrial flutter since 6/24, ventricular rate 150-160, now down to 130 BPM on iv  amiodarone and oral metoprolol. Cardizem CD 120 mg po added on 6/25; will change to 60 mg every 6 hrs rather than long acting for now.  Will titrate metoprolol to 100 mg bid today.  On heparin;  INR today is 1.43.   If he doesn't convert on his own, consider another DCCV once he is loaded with amiodarone,   4. AKI  on CKD stage III -  Initial Cr 2.40 on 6/10; Peak Cr 5.32 on 6/14.  Cr 1.78 yesterday but today 1.94. GFR 41.  5. Progressive anemia - H/H better now at 11.9/36.2 yesterday but 10.7/32.7 today   6. Hypokalemia- repleted  7. HTN- increased to 145/99 earlier; metoprolol and cardizem to be increased today   Troy Sine, MD, [facility] 08/29/2015, 9:24 AM

## 2015-08-29 NOTE — Progress Notes (Signed)
Physical Therapy Wound Treatment Patient Details  Name: Frank Hoover MRN: [id number] Date of Birth: Oct 28, 1950  Today's Date: 08/29/2015 Time: 4158-3094 Time Calculation (min): 49 min  Subjective  Subjective: Pt pleasant and agreeable to therapy Patient and Family Stated Goals: Did not state Date of Onset:  (Unknown)  Pain Score: Pt did not complain of pain throughout session.   Wound Assessment  Pressure Ulcer 08/29/15 Unstageable - Full thickness tissue loss in which the base of the ulcer is covered by slough (yellow, tan, gray, green or brown) and/or eschar (tan, brown or black) in the wound bed. 100% yellow-per staff started as blister (Active)  Dressing Type ABD;Barrier Film (skin prep);Gauze (Comment);Moist to dry 08/29/2015  2:00 PM  Dressing Changed;Clean;Dry;Intact 08/29/2015  2:00 PM  Dressing Change Frequency Daily 08/29/2015  2:00 PM  State of Healing Eschar 08/29/2015  2:00 PM  Site / Wound Assessment Yellow;Pink 08/29/2015  2:00 PM  % Wound base Red or Granulating 5% 08/29/2015  2:00 PM  % Wound base Yellow 95% 08/29/2015  2:00 PM  % Wound base Black 0% 08/29/2015  2:00 PM  Peri-wound Assessment Intact 08/29/2015  2:00 PM  Wound Length (cm) 9.5 cm 08/29/2015  2:00 PM  Wound Width (cm) 5.8 cm 08/29/2015  2:00 PM  Wound Depth (cm) 0.1 cm 08/29/2015  2:00 PM  Drainage Amount Minimal 08/29/2015  2:00 PM  Drainage Description Purulent 08/29/2015  2:00 PM  Treatment Debridement (Selective);Hydrotherapy (Pulse lavage);Packing (Saline gauze) 08/29/2015  2:00 PM  Santyl applied to wound bed prior to applying dressing.   Hydrotherapy Pulsed lavage therapy - wound location: Gluteal cleft Pulsed Lavage with Suction (psi): 12 psi Pulsed Lavage with Suction - Normal Saline Used: 1000 mL Pulsed Lavage Tip: Tip with splash shield Selective Debridement Selective Debridement - Location: Gluteal cleft Selective Debridement - Tools Used: Scalpel;Forceps Selective Debridement - Tissue Removed:  yellow necrotic tissue   Wound Assessment and Plan  Wound Therapy - Assess/Plan/Recommendations Wound Therapy - Clinical Statement: Pt presents to hydrotherapy with a pressure injury to the gluteal cleft. This patient will benefit from continued hydrotherapy for selective removal of necrotic tissue and to promote wound healing.  Wound Therapy - Functional Problem List: Decreased tolerance of OOB, acute pain.  Factors Delaying/Impairing Wound Healing: Altered sensation;Diabetes Mellitus;Immobility;Multiple medical problems Hydrotherapy Plan: Debridement;Dressing change;Patient/family education;Pulsatile lavage with suction Wound Therapy - Frequency: 6X / week Wound Therapy - Follow Up Recommendations: Other (comment) (CIR) Wound Plan: See above  Wound Therapy Goals- Improve the function of patient's integumentary system by progressing the wound(s) through the phases of wound healing (inflammation - proliferation - remodeling) by: Decrease Necrotic Tissue to: 25% Decrease Necrotic Tissue - Progress: Goal set today Increase Granulation Tissue to: 75% Increase Granulation Tissue - Progress: Goal set today Goals/treatment plan/discharge plan were made with and agreed upon by patient/family: Yes Time For Goal Achievement: 7 days Wound Therapy - Potential for Goals: Good  Goals will be updated until maximal potential achieved or discharge criteria met.  Discharge criteria: when goals achieved, discharge from hospital, MD decision/surgical intervention, no progress towards goals, refusal/missing three consecutive treatments without notification or medical reason.  GP     Rolinda Roan 08/29/2015, 3:37 PM   Rolinda Roan, PT, DPT Acute Rehabilitation Services Pager: [phone]

## 2015-08-29 NOTE — Progress Notes (Signed)
ANTICOAGULATION CONSULT NOTE - Follow Up Consult  Pharmacy Consult for coumadin, heparin  Indication: atrial fibrillation  No Known Allergies  Patient Measurements: Height: 5\' 6"  (167.6 cm) Weight: 254 lb 10.1 oz (115.5 kg) IBW/kg (Calculated) : 63.8 Heparin Dosing Weight: 94kg  Vital Signs: Temp: 98.5 F (36.9 C) (06/27 0751) Temp Source: Oral (06/26 2300) BP: 145/99 mmHg (06/27 0800) Pulse Rate: 131 (06/27 0800)  Labs:  Recent Labs  08/27/15 0326 08/27/15 1827 08/27/15 1828 08/28/15 1141 08/28/15 1251 08/28/15 2008 08/29/15 0600 08/29/15 0605  HGB 7.5* 11.0*  --   --  11.9*  --  10.7*  --   HCT 24.4* 33.6*  --   --  36.2*  --  32.7*  --   PLT 292 319  --   --  296  --  308  --   LABPROT 16.7*  --   --   --  17.3*  --  17.6*  --   INR 1.34  --   --   --  1.40  --  1.43  --   HEPARINUNFRC  --   --  <0.10*  --   --  <0.10*  --  <0.10*  CREATININE 1.91*  --   --  1.78*  --   --  1.94*  --     Estimated Creatinine Clearance: 46.6 mL/min (by C-G formula based on Cr of 1.94).   Medications:  Scheduled:  . acyclovir ointment   Topical 6 X Daily  . cefTRIAXone (ROCEPHIN)  IV  2 g Intravenous Q24H  . diltiazem  60 mg Oral Q6H  . feeding supplement (ENSURE ENLIVE)  237 mL Oral TID BM  . furosemide  40 mg Oral Daily  . hydrALAZINE  75 mg Oral Q8H  . insulin aspart  0-15 Units Subcutaneous TID WC  . insulin glargine  32 Units Subcutaneous Daily  . isosorbide mononitrate  60 mg Oral Daily  . metoprolol tartrate  100 mg Oral BID  . oxybutynin  5 mg Oral TID  . pantoprazole  40 mg Oral Daily  . prochlorperazine  5 mg Intravenous Once  . sodium chloride flush  3 mL Intravenous Q12H  . Warfarin - Pharmacist Dosing Inpatient   Does not apply q1800    Assessment: 52 YOM admitted 08/14/2015 with newly diagnosed AFlutter with RVR (s/p DCCV on 6/20). Pharmacy consulted to dose coumadin. INR up to 6.36 and Vitamin K 5mg  IV given x 1 (6/24).He is noted with recent a lower GI  bleed (Flex sigmoidoscopy 6/23 w/ biopsy) to resume heparin and coumadin (spoke with Dr. Waldron [facility]). Will keep on the lower end of goal for heparin and coumadin.  -INR= 1.34, heparin level is < 0.1, Hg= 10.7, plt= 308  Goal of Therapy:  Heparin level 0.3-0.5 units/ml  INR goal 2-2.5 Monitor platelets by anticoagulation protocol: Yes   Plan:  -increase heparin to  1100 units/hr -Heparin level in 6 hours and daily wth CBC daily -Coumadin 1mg  po today -Daily INR  Hildred Laser, Pharm D 08/29/2015 9:50 AM

## 2015-08-29 NOTE — Consult Note (Signed)
WOC wound consult note Reason for Consult: requested to come into the room to assess sacral/gluteal wounds. Patient has been inpatient since 08/14/15.  Wound type: Unstageable Pressure Injury gluteal cleft  Pressure Ulcer POA: No Measurement: 9cm x 3cm x 0.2cm  Wound bed: 100% yellow Drainage (amount, consistency, odor) moderate, yellow Periwound: intact Dressing procedure/placement/frequency: Add enzymatic debridement and PT for hydrotherapy for debridement.  Add low air loss mattress for pressure redistribution.  Add chair pressure redistribution pad.  Contacted PT department for initiation of hydrotherapy.   [place] team will follow along with you for weekly wound assessments.  Please notify me of any acute changes in the wounds or any new areas of concerns Para March RN,CWOCN [phone]

## 2015-08-29 NOTE — Progress Notes (Addendum)
PROGRESS NOTE    Frank Hoover  OIB:[id number] DOB: 01/15/1951 DOA: 08/14/2015 PCP: Alonza Bogus, MD    Brief Narrative:  65 year old male was originally seen by his nephrologist in [place], was found to be in A. fib with RVR , admitted to any pain on 08/15/15 , noted to be in a flutter with RVR which is new diagnosis, 2-D echo showing EF 25%, and developed septic and cardiogenic shock, febrile 104, transferred to PCCM at [facility], treated for  Bacteremia due to UTI, requiring  pressor support, he required intubation by PCCM, successfully extubated on 6/19 Much improved and was transferred to [facility] on 6/20.  Patient was seen by cardiology and physiology for atrial flutter, and SVT, converted to NSR after TEE and DCCV on 6/20, and unfortunately back to A. fib with RVR, followed by cardiology regarding CHF and heart rate control. Patient and anticoagulation for atrial fibrillation, developed bright red blood per rectum, flexible sigmoidoscopy significant for ischemic colitis.     Assessment & Plan:   Principal Problem:   Atrial flutter with rapid ventricular response (HCC) Active Problems:   GERD   DM (diabetes mellitus) (HCC)   HTN (hypertension)   Hyperlipemia   Sleep apnea   Systolic CHF (HCC)   Acute respiratory failure (HCC)   Septic shock (HCC)   Acute encephalopathy   Acute kidney injury ([place])   Acute systolic heart failure (HCC)   SVT (supraventricular tachycardia) ([place])   Debility   Blood in stool   Acute blood loss anemia   Abnormal CT scan, colon   Proctosigmoiditis   Lower GI bleed   Noninfectious gastroenteritis, unspecified   Acute hypoxic resp failure - With history of OSA, COPD, and cardiogenic/septic shock - Bipap Qhs for sleep apnea - improving, today he weaned of oxygen   Septic shock / Citrobacter bacteremia in the setting of UTI - Septic shock resolved - Blood cultures and urine culture growing Citrobacter, treated with total of 8 days of  Antibiotics, resumed Rocephin 6/22 for total of 2 weeks treatment (stop date 6/28) -  repeat blood cultures 6/22: One bottle out of 2 sets growing GPC,  empiric IV vancomycin has been stopped as  culture growing micrococcus species, most likely contamination.  Lower GI bleed secondary to ischemic colitis - Secondary to ischemic colitis as confirmed by biopsy ,status post flexible sigmoidoscopy on 6/23 -  transfused 2 units PRBC on 6/25, glycohemoglobin remained stable. - continue  with heparin GTT for anticoagulation, will try to keep therapeutic range on the lower side, resumed on warfarin as discussed with GI.  Supratherapeutic INR/coagulopathy - Secondary to warfarin - INR 6.3 on 6/24, received 5 mg of IV vitamin K  HCAP - Treated  Cardiogenic shock/acute  systolic CHF with EF 94% - Currently off pressors, management per cardiology - Diuresis per cardiology, appears to be improving, not on ACEI/ARB/spironolactonealdacton due to ARF.  Atrial fib with RVR  - Management per cardiology, status post s/p  TEE/DCCV 08/22/15 , converted to NSR, but unfortunately currently back in A. fib with RVR, management per cardiology, titrating Cardizem, metoprolol, currently on amiodarone drip, but he likely will need another cardioversion once he is loaded with amiodarone as per cardiology. - On anticoagulation, continue with heparin GTT, resumed on warfarin.    Episode of SVT on 08/23/18 - required carotid massage, IV adenosine, EP consult appreciated  HTN - Blood pressure acceptable,  AKI on CKD  - Continue to monitor, improving despite diuresis  Anemia  -  Multifactorial, setting of chronic kidney disease, chronic illness, and GI bleed, monitor closely and transfuse as needed  DM  - Better controlled after increasing Lantus, continue with insulin sliding scale .  Urinary retention -foley per urology, to remain for several weeks to allow urethra healing as per urology recommendation.  Acute  metabolic encephalopathy - Improving   Sacral pressure ulcer - Wound care consulted  DVT prophylaxis:  on heaprin GTT  Code Status: Full Code   Family Communication: none at bedside  Disposition Plan:  Remain in SDU, seen by CIR,    Consultants:   Cards  PCCM  Urology  Inpatient rehabilitation  Procedures:  6/13 R Femoral CVL >> 6/13 6/13 ETT >> 6/18 6/13 Lt radial aline >> 6/17 6/13 RIJ CVL >>>plan out 6/19 6/25 2 units PRBC transfusion  Subjective: Feeling better, report , at baseline, denies chest pain, post feeling much better today, no further episodes of hematochezia or blood in stool.  Objective: Filed Vitals:   08/29/15 0600 08/29/15 0655 08/29/15 0751 08/29/15 0800  BP: 136/92 136/92  145/99  Pulse: 130   131  Temp:   98.5 F (36.9 C)   TempSrc:      Resp: 22   22  Height:      Weight:      SpO2: 95%   95%    Intake/Output Summary (Last 24 hours) at 08/29/15 0957 Last data filed at 08/29/15 0900  Gross per 24 hour  Intake 1737.1 ml  Output   1458 ml  Net  279.1 ml   Filed Weights   08/27/15 0117 08/28/15 0300 08/29/15 0400  Weight: 117.1 kg (258 lb 2.5 oz) 114.5 kg (252 lb 6.8 oz) 115.5 kg (254 lb 10.1 oz)    Examination:  General exam: Awake, alert,  Respiratory system: diminshed, no wheezing, Respiratory effort normal. Cardiovascular system: S1 & S2 heard,Irregular, tachycardic, murmurs, rubs, gallops or clicks. No pedal edema. Gastrointestinal system: Abdomen is nondistended, soft and nontender, obese. Lower extremity: + edema, pulses felt bilaterally     Data Reviewed: I have personally reviewed following labs and imaging studies  CBC:  Recent Labs Lab 08/26/15 1352 08/27/15 0326 08/27/15 1827 08/28/15 1251 08/29/15 0600  WBC 6.3 5.0 7.0 6.9 6.3  HGB 8.6* 7.5* 11.0* 11.9* 10.7*  HCT 27.3* 24.4* 33.6* 36.2* 32.7*  MCV 87.2 89.7 84.0 84.6 84.3  PLT 298 292 319 296 157   Basic Metabolic Panel:  Recent Labs Lab  08/24/15 0247 08/25/15 0423 08/26/15 0620 08/27/15 0326 08/28/15 1141 08/29/15 0600  NA 135 136 136 135 137 138  K 3.1* 3.8 3.7 3.2* 3.8 3.6  CL 102 106 105 104 106 103  CO2 25 25 22 24 23 26   GLUCOSE 161* 170* 191* 154* 202* 193*  BUN 61* 47* 31* 23* 18 18  CREATININE 2.73* 2.49* 2.06* 1.91* 1.78* 1.94*  CALCIUM 8.0* 8.1* 8.5* 8.0* 8.4* 8.4*  MG 1.8  --   --   --  1.6*  --   PHOS  --   --   --   --  2.5  --    GFR: Estimated Creatinine Clearance: 46.6 mL/min (by C-G formula based on Cr of 1.94). Liver Function Tests:  Recent Labs Lab 08/25/15 0423  AST 38  ALT 41  ALKPHOS 57  BILITOT 0.1*  PROT 5.4*  ALBUMIN 1.8*   No results for input(s): LIPASE, AMYLASE in the last 168 hours. No results for input(s): AMMONIA in the last 168 hours.  Coagulation Profile:  Recent Labs Lab 08/25/15 0423 08/26/15 0620 08/27/15 0326 08/28/15 1251 08/29/15 0600  INR 3.69* 6.36* 1.34 1.40 1.43   Cardiac Enzymes: No results for input(s): CKTOTAL, CKMB, CKMBINDEX, TROPONINI in the last 168 hours. BNP (last 3 results) No results for input(s): PROBNP in the last 8760 hours. HbA1C: No results for input(s): HGBA1C in the last 72 hours. CBG:  Recent Labs Lab 08/28/15 0755 08/28/15 1153 08/28/15 1721 08/28/15 2129 08/29/15 0754  GLUCAP 169* 184* 187* 218* 198*   Lipid Profile: No results for input(s): CHOL, HDL, LDLCALC, TRIG, CHOLHDL, LDLDIRECT in the last 72 hours. Thyroid Function Tests: No results for input(s): TSH, T4TOTAL, FREET4, T3FREE, THYROIDAB in the last 72 hours. Anemia Panel: No results for input(s): VITAMINB12, FOLATE, FERRITIN, TIBC, IRON, RETICCTPCT in the last 72 hours. Urine analysis:    Component Value Date/Time   COLORURINE AMBER* 08/14/2015 2125   APPEARANCEUR HAZY* 08/14/2015 2125   LABSPEC 1.015 08/14/2015 2125   PHURINE 5.0 08/14/2015 2125   GLUCOSEU 100* 08/14/2015 2125   HGBUR LARGE* 08/14/2015 2125   BILIRUBINUR NEGATIVE 08/14/2015 2125    KETONESUR NEGATIVE 08/14/2015 2125   PROTEINUR 100* 08/14/2015 2125   UROBILINOGEN 0.2 12/02/2013 1739   NITRITE NEGATIVE 08/14/2015 2125   LEUKOCYTESUR MODERATE* 08/14/2015 2125   Sepsis Labs: @LABRCNTIP (procalcitonin:4,lacticidven:4)  ) Recent Results (from the past 240 hour(s))  C difficile quick scan w PCR reflex     Status: None   Collection Time: 08/23/15  4:14 PM  Result Value Ref Range Status   C Diff antigen NEGATIVE NEGATIVE Final   C Diff toxin NEGATIVE NEGATIVE Final   C Diff interpretation Negative for toxigenic C. difficile  Final  Gastrointestinal Panel by PCR , Stool     Status: None   Collection Time: 08/24/15 11:29 AM  Result Value Ref Range Status   Campylobacter species NOT DETECTED NOT DETECTED Final   Plesimonas shigelloides NOT DETECTED NOT DETECTED Final   Salmonella species NOT DETECTED NOT DETECTED Final   Yersinia enterocolitica NOT DETECTED NOT DETECTED Final   Vibrio species NOT DETECTED NOT DETECTED Final   Vibrio cholerae NOT DETECTED NOT DETECTED Final   Enteroaggregative E coli (EAEC) NOT DETECTED NOT DETECTED Final   Enteropathogenic E coli (EPEC) NOT DETECTED NOT DETECTED Final   Enterotoxigenic E coli (ETEC) NOT DETECTED NOT DETECTED Final   Shiga like toxin producing E coli (STEC) NOT DETECTED NOT DETECTED Final   E. coli O157 NOT DETECTED NOT DETECTED Final   Shigella/Enteroinvasive E coli (EIEC) NOT DETECTED NOT DETECTED Final   Cryptosporidium NOT DETECTED NOT DETECTED Final   Cyclospora cayetanensis NOT DETECTED NOT DETECTED Final   Entamoeba histolytica NOT DETECTED NOT DETECTED Final   Giardia lamblia NOT DETECTED NOT DETECTED Final   Adenovirus F40/41 NOT DETECTED NOT DETECTED Final   Astrovirus NOT DETECTED NOT DETECTED Final   Norovirus GI/GII NOT DETECTED NOT DETECTED Final   Rotavirus A NOT DETECTED NOT DETECTED Final   Sapovirus (I, II, IV, and V) NOT DETECTED NOT DETECTED Final  Culture, blood (Routine X 2) w Reflex to ID  Panel     Status: None (Preliminary result)   Collection Time: 08/24/15 12:20 PM  Result Value Ref Range Status   Specimen Description BLOOD LEFT HAND  Final   Special Requests BOTTLES DRAWN AEROBIC ONLY 10CC  Final   Culture NO GROWTH 4 DAYS  Final   Report Status PENDING  Incomplete  Culture, blood (Routine X 2) w Reflex to  ID Panel     Status: Abnormal   Collection Time: 08/24/15 12:40 PM  Result Value Ref Range Status   Specimen Description BLOOD LEFT HAND  Final   Special Requests IN PEDIATRIC BOTTLE 2.5CC  Final   Culture  Setup Time   Final    GRAM POSITIVE COCCI IN CLUSTERS AEROBIC BOTTLE ONLY CRITICAL RESULT CALLED TO, READ BACK BY AND VERIFIED WITH: JAMES LEDFORD,PHARMD @0113  08/26/15 MKELLY    Culture (A)  Final    MICROCOCCUS SPECIES Standardized susceptibility testing for this organism is not available.    Report Status 08/28/2015 FINAL  Final  Blood Culture ID Panel (Reflexed)     Status: None   Collection Time: 08/24/15 12:40 PM  Result Value Ref Range Status   Enterococcus species NOT DETECTED NOT DETECTED Final   Vancomycin resistance NOT DETECTED NOT DETECTED Final   Listeria monocytogenes NOT DETECTED NOT DETECTED Final   Staphylococcus species NOT DETECTED NOT DETECTED Final   Staphylococcus aureus NOT DETECTED NOT DETECTED Final   Methicillin resistance NOT DETECTED NOT DETECTED Final   Streptococcus species NOT DETECTED NOT DETECTED Final   Streptococcus agalactiae NOT DETECTED NOT DETECTED Final   Streptococcus pneumoniae NOT DETECTED NOT DETECTED Final   Streptococcus pyogenes NOT DETECTED NOT DETECTED Final   Acinetobacter baumannii NOT DETECTED NOT DETECTED Final   Enterobacteriaceae species NOT DETECTED NOT DETECTED Final   Enterobacter cloacae complex NOT DETECTED NOT DETECTED Final   Escherichia coli NOT DETECTED NOT DETECTED Final   Klebsiella oxytoca NOT DETECTED NOT DETECTED Final   Klebsiella pneumoniae NOT DETECTED NOT DETECTED Final    Proteus species NOT DETECTED NOT DETECTED Final   Serratia marcescens NOT DETECTED NOT DETECTED Final   Carbapenem resistance NOT DETECTED NOT DETECTED Final   Haemophilus influenzae NOT DETECTED NOT DETECTED Final   Neisseria meningitidis NOT DETECTED NOT DETECTED Final   Pseudomonas aeruginosa NOT DETECTED NOT DETECTED Final   Candida albicans NOT DETECTED NOT DETECTED Final   Candida glabrata NOT DETECTED NOT DETECTED Final   Candida krusei NOT DETECTED NOT DETECTED Final   Candida parapsilosis NOT DETECTED NOT DETECTED Final   Candida tropicalis NOT DETECTED NOT DETECTED Final      Anti-infectives    Start     Dose/Rate Route Frequency Ordered Stop   08/27/15 1100  vancomycin (VANCOCIN) 1,250 mg in sodium chloride 0.9 % 250 mL IVPB  Status:  Discontinued     1,250 mg 166.7 mL/hr over 90 Minutes Intravenous Every 24 hours 08/27/15 0842 08/28/15 1125   08/26/15 0130  vancomycin (VANCOCIN) 1,500 mg in sodium chloride 0.9 % 500 mL IVPB  Status:  Discontinued     1,500 mg 250 mL/hr over 120 Minutes Intravenous Every 48 hours 08/26/15 0121 08/27/15 0841   08/24/15 1200  cefTRIAXone (ROCEPHIN) 2 g in dextrose 5 % 50 mL IVPB     2 g 100 mL/hr over 30 Minutes Intravenous Every 24 hours 08/24/15 1055     08/20/15 0000  cefTRIAXone (ROCEPHIN) 2 g in dextrose 5 % 50 mL IVPB  Status:  Discontinued     2 g 100 mL/hr over 30 Minutes Intravenous Every 24 hours 08/19/15 1112 08/22/15 1310   08/18/15 1100  cefTAZidime (FORTAZ) 2 g in dextrose 5 % 50 mL IVPB  Status:  Discontinued     2 g 100 mL/hr over 30 Minutes Intravenous Every 24 hours 08/18/15 1039 08/19/15 1112   08/17/15 1800  vancomycin (VANCOCIN) 1,500 mg in sodium chloride 0.9 %  500 mL IVPB  Status:  Discontinued     1,500 mg 250 mL/hr over 120 Minutes Intravenous Every 48 hours 08/15/15 1636 08/15/15 1956   08/16/15 2100  ceFEPIme (MAXIPIME) 1 g in dextrose 5 % 50 mL IVPB  Status:  Discontinued     1 g 100 mL/hr over 30 Minutes  Intravenous Every 24 hours 08/15/15 1957 08/16/15 1415   08/16/15 2100  ceFEPIme (MAXIPIME) 2 g in dextrose 5 % 50 mL IVPB  Status:  Discontinued     2 g 100 mL/hr over 30 Minutes Intravenous Every 24 hours 08/16/15 1415 08/16/15 1741   08/16/15 1900  piperacillin-tazobactam (ZOSYN) IVPB 2.25 g  Status:  Discontinued     2.25 g 100 mL/hr over 30 Minutes Intravenous Every 8 hours 08/16/15 1809 08/18/15 1014   08/15/15 2100  ceFEPIme (MAXIPIME) 2 g in dextrose 5 % 50 mL IVPB     2 g 100 mL/hr over 30 Minutes Intravenous  Once 08/15/15 1957 08/15/15 2359   08/15/15 1730  vancomycin (VANCOCIN) 2,000 mg in sodium chloride 0.9 % 500 mL IVPB     2,000 mg 250 mL/hr over 120 Minutes Intravenous  Once 08/15/15 1636 08/15/15 2000   08/15/15 0900  piperacillin-tazobactam (ZOSYN) IVPB 3.375 g  Status:  Discontinued     3.375 g 12.5 mL/hr over 240 Minutes Intravenous Every 8 hours 08/15/15 0856 08/15/15 1956   08/15/15 0100  vancomycin (VANCOCIN) IVPB 1000 mg/200 mL premix     1,000 mg 200 mL/hr over 60 Minutes Intravenous STAT 08/15/15 0048 08/15/15 0206   08/14/15 2330  vancomycin (VANCOCIN) IVPB 1000 mg/200 mL premix     1,000 mg 200 mL/hr over 60 Minutes Intravenous STAT 08/14/15 2316 08/15/15 0041   08/14/15 2330  piperacillin-tazobactam (ZOSYN) IVPB 3.375 g     3.375 g 12.5 mL/hr over 240 Minutes Intravenous STAT 08/14/15 2317 08/15/15 0341       Radiology Studies: No results found.      Scheduled Meds: . acyclovir ointment   Topical 6 X Daily  . cefTRIAXone (ROCEPHIN)  IV  2 g Intravenous Q24H  . diltiazem  60 mg Oral Q6H  . feeding supplement (ENSURE ENLIVE)  237 mL Oral TID BM  . furosemide  40 mg Oral Daily  . hydrALAZINE  75 mg Oral Q8H  . insulin aspart  0-15 Units Subcutaneous TID WC  . insulin glargine  32 Units Subcutaneous Daily  . isosorbide mononitrate  60 mg Oral Daily  . metoprolol tartrate  100 mg Oral BID  . oxybutynin  5 mg Oral TID  . pantoprazole  40 mg Oral  Daily  . prochlorperazine  5 mg Intravenous Once  . sodium chloride flush  3 mL Intravenous Q12H  . warfarin  1 mg Oral ONCE-1800  . Warfarin - Pharmacist Dosing Inpatient   Does not apply q1800   Continuous Infusions: . sodium chloride Stopped (08/29/15 0000)  . sodium chloride Stopped (08/21/15 1617)  . amiodarone 30 mg/hr (08/29/15 0427)  . heparin 900 Units/hr (08/29/15 0000)     LOS: 15 days    Time spent: 22 min    Iceis Knab, MD [facility] Pager [phone]  If 7PM-7AM, please contact night-coverage [email or web address] Password [facility] 08/29/2015, 9:57 AM

## 2015-08-29 NOTE — Progress Notes (Signed)
ANTICOAGULATION CONSULT NOTE - Follow Up Consult  Pharmacy Consult for heparin Indication: atrial fibrillation  No Known Allergies  Patient Measurements: Height: 5\' 6"  (167.6 cm) Weight: 254 lb 10.1 oz (115.5 kg) IBW/kg (Calculated) : 63.8 Heparin Dosing Weight: 94 kg  Vital Signs: Temp: 97.3 F (36.3 C) (06/27 1210) Temp Source: Oral (06/27 1210) BP: 146/97 mmHg (06/27 1705) Pulse Rate: 131 (06/27 1705)  Labs:  Recent Labs  08/27/15 0326 08/27/15 1827  08/28/15 1141 08/28/15 1251 08/28/15 2008 08/29/15 0600 08/29/15 0605 08/29/15 1758  HGB 7.5* 11.0*  --   --  11.9*  --  10.7*  --   --   HCT 24.4* 33.6*  --   --  36.2*  --  32.7*  --   --   PLT 292 319  --   --  296  --  308  --   --   LABPROT 16.7*  --   --   --  17.3*  --  17.6*  --   --   INR 1.34  --   --   --  1.40  --  1.43  --   --   HEPARINUNFRC  --   --   < >  --   --  <0.10*  --  <0.10* <0.10*  CREATININE 1.91*  --   --  1.78*  --   --  1.94*  --   --   < > = values in this interval not displayed.  Estimated Creatinine Clearance: 46.6 mL/min (by C-G formula based on Cr of 1.94).   Medications:  Scheduled:  . acyclovir ointment   Topical 6 X Daily  . cefTRIAXone (ROCEPHIN)  IV  2 g Intravenous Q24H  . collagenase   Topical Daily  . diltiazem  60 mg Oral Q6H  . feeding supplement (ENSURE ENLIVE)  237 mL Oral TID BM  . furosemide  40 mg Oral Daily  . hydrALAZINE  75 mg Oral Q8H  . insulin aspart  0-15 Units Subcutaneous TID WC  . insulin glargine  32 Units Subcutaneous Daily  . isosorbide mononitrate  60 mg Oral Daily  . metoprolol tartrate  100 mg Oral BID  . oxybutynin  5 mg Oral TID  . pantoprazole  40 mg Oral Daily  . prochlorperazine  5 mg Intravenous Once  . sodium chloride flush  3 mL Intravenous Q12H  . Warfarin - Pharmacist Dosing Inpatient   Does not apply q1800   Infusions:  . sodium chloride Stopped (08/29/15 0000)  . sodium chloride Stopped (08/21/15 1617)  . amiodarone 30 mg/hr  (08/29/15 1500)  . heparin 1,100 Units/hr (08/29/15 1231)    Assessment: 65 yo male with afib is currently on subtherapeutic heparin. Heparin level is <0.1.  Talked to RN and there's not any problem with infusion. Goal of Therapy:  Heparin level 0.3-0.5 units/ml Monitor platelets by anticoagulation protocol: Yes   Plan:  - increase heparin to 1350 units/hr - 6hr heparin level   Geoffrey Hynes, Tsz-Yin 08/29/2015,8:01 PM

## 2015-08-29 NOTE — Progress Notes (Signed)
Daily Rounding Note  08/29/2015, 8:20 AM  LOS: 15 days   SUBJECTIVE:       Liquid brown stool yesterday evening.  Pt saw no blood.  Eating better, no abd pain or n/v.    OBJECTIVE:         Vital signs in last 24 hours:    Temp:  [98.2 F (36.8 C)-98.7 F (37.1 C)] 98.5 F (36.9 C) (06/27 0751) Pulse Rate:  [128-135] 131 (06/27 0800) Resp:  [17-23] 22 (06/27 0800) BP: (120-150)/(78-104) 145/99 mmHg (06/27 0800) SpO2:  [94 %-100 %] 95 % (06/27 0800) Weight:  [115.5 kg (254 lb 10.1 oz)] 115.5 kg (254 lb 10.1 oz) (06/27 0400) Last BM Date: 08/28/15 Filed Weights   08/27/15 0117 08/28/15 0300 08/29/15 0400  Weight: 117.1 kg (258 lb 2.5 oz) 114.5 kg (252 lb 6.8 oz) 115.5 kg (254 lb 10.1 oz)   General: obese, not toxic   Heart: RRR Chest: clear bil Abdomen: obese, active BS, NT, soft.   Extremities: no CCE  Neuro/Psych:  Alert, appropriate, not confused  Intake/Output from previous day: 06/26 0701 - 06/27 0700 In: 1519.1 [P.O.:350; I.V.:1169.1] Out: 1283 [Urine:1280; Stool:3]  Intake/Output this shift: Total I/O In: 25.7 [I.V.:25.7] Out: -   Lab Results:  Recent Labs  08/27/15 1827 08/28/15 1251 08/29/15 0600  WBC 7.0 6.9 6.3  HGB 11.0* 11.9* 10.7*  HCT 33.6* 36.2* 32.7*  PLT 319 296 308   BMET  Recent Labs  08/27/15 0326 08/28/15 1141 08/29/15 0600  NA 135 137 138  K 3.2* 3.8 3.6  CL 104 106 103  CO2 24 23 26   GLUCOSE 154* 202* 193*  BUN 23* 18 18  CREATININE 1.91* 1.78* 1.94*  CALCIUM 8.0* 8.4* 8.4*   LFT No results for input(s): PROT, ALBUMIN, AST, ALT, ALKPHOS, BILITOT, BILIDIR, IBILI in the last 72 hours. PT/INR  Recent Labs  08/28/15 1251 08/29/15 0600  LABPROT 17.3* 17.6*  INR 1.40 1.43   Hepatitis Panel No results for input(s): HEPBSAG, HCVAB, HEPAIGM, HEPBIGM in the last 72 hours.  Studies/Results: No results found.   Scheduled Meds: . acyclovir ointment   Topical 6 X  Daily  . cefTRIAXone (ROCEPHIN)  IV  2 g Intravenous Q24H  . diltiazem  120 mg Oral Daily  . feeding supplement (ENSURE ENLIVE)  237 mL Oral TID BM  . furosemide  40 mg Oral Daily  . hydrALAZINE  75 mg Oral Q8H  . insulin aspart  0-15 Units Subcutaneous TID WC  . insulin glargine  32 Units Subcutaneous Daily  . isosorbide mononitrate  60 mg Oral Daily  . metoprolol tartrate  75 mg Oral BID  . oxybutynin  5 mg Oral TID  . pantoprazole  40 mg Oral Daily  . prochlorperazine  5 mg Intravenous Once  . sodium chloride flush  3 mL Intravenous Q12H  . Warfarin - Pharmacist Dosing Inpatient   Does not apply q1800   Continuous Infusions: . sodium chloride Stopped (08/29/15 0000)  . sodium chloride Stopped (08/21/15 1617)  . amiodarone 30 mg/hr (08/29/15 0427)  . heparin 900 Units/hr (08/29/15 0000)   PRN Meds:.acetaminophen (TYLENOL) oral liquid 160 mg/5 mL, hydrALAZINE, hydrocortisone cream, ipratropium-albuterol, loperamide, metoprolol, ondansetron (ZOFRAN) IV, sodium chloride flush, sodium chloride flush, sodium chloride flush   ASSESMENT:   * Distal colitis with hematochezia. 6/23 flex sig: friable, ulcerated rectum and sigmoid. Path confirms suspected ischemia. 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  Clinically no persistent acute GI issues  * A flutter, recurrent s/p 6/20 DCCV. On Heparin, amio, cardizem. Note INR of 6.3 on 6/24.   * AKI.   * PCM, prot cal malnutrition. Albumin 1.8  * Anemia. Normocytic. 2 units PRBC 6/25: Hgb stable.   * Constipation, Lin[place] on PTA med list.     PLAN   *  GI signing off.  GI follow up prn with his GI Dr Sydell Axon in [place].     Azucena Freed  08/29/2015, 8:20 AM Pager: [phone]

## 2015-08-29 NOTE — Evaluation (Signed)
Clinical/Bedside Swallow Evaluation Patient Details  Name: Frank Hoover MRN: [id number] Date of Birth: 1951/01/31  Today's Date: 08/29/2015 Time: SLP Start Time (ACUTE ONLY): [id number] SLP Stop Time (ACUTE ONLY): 1652 SLP Time Calculation (min) (ACUTE ONLY): 13 min  Past Medical History:  Past Medical History  Diagnosis Date  . Gout   . COPD (chronic obstructive pulmonary disease) ([place])   . Type 2 diabetes mellitus ([place])   . GERD (gastroesophageal reflux disease)   . Essential hypertension   . Hyperlipemia   . OA (osteoarthritis)   . Gastroparesis   . Adenomatous colon polyp 10/30/09    Serrated adenoma removed during Colonoscopy   . Diverticulosis of colon   . CKD (chronic kidney disease) stage 3, GFR 30-59 ml/min    Past Surgical History:  Past Surgical History  Procedure Laterality Date  . Tonsillectomy    . Cataract extraction Right   . Esophagogastroduodenoscopy  10/30/09    [id number]   . Colonoscopy  10/30/09    [id number] diverticulum/serrated adenoma from ICV, next colonoscopy due 10/2012  . Colonoscopy N/A 09/18/2012    [id number] mucosa that was seen appeared normal, however most of it was not seen due to be very poor prep  . Colonoscopy N/A 04/08/2013    [id number] polyp removed/inadequate preparation  . Colonoscopy N/A 06/22/2014    Procedure: COLONOSCOPY;  Surgeon: Daneil Dolin, MD;  [facility];  Service: Endoscopy;  Laterality: N/A;  115   . Tee without cardioversion N/A 08/22/2015    Procedure: TRANSESOPHAGEAL ECHOCARDIOGRAM (TEE);  Surgeon: Larey Dresser, MD;  Location: [place];  Service: Cardiovascular;  Laterality: N/A;  . Cardioversion N/A 08/22/2015    Procedure: CARDIOVERSION;  Surgeon: Larey Dresser, MD;  Location: [place];  Service: Cardiovascular;  Laterality: N/A;  . Flexible sigmoidoscopy N/A 08/25/2015    Procedure: FLEXIBLE SIGMOIDOSCOPY;  Surgeon: Jerene Bears, MD;  Location: [facility];  Service: Endoscopy;   Laterality: N/A;   HPI:  65 year old male with PMH of GERD, OSA, COPD, admitted with dizziness, tachycardia and leg edema. Dx with acute cardiogenic/septic shock, acute diastolic CHF, A-flutter, intubated 6/13-6/18.  Initially assessed by SLP services 6/20 with findings of generally functional swallow.  Subsequently underwent TEE and converted to NSR; back to A. Fib with RVR, followed by cardiology regarding CHF and HR control. Developed lower GI bleed, flexible sigmoidoscopy significant for ischemic colitis.     Assessment / Plan / Recommendation Clinical Impression  New orders received for swallow eval due to c/o odynophagia. Pt demonstrates active mastication, brisk swallow response, no s/s of aspiration.  Passed three oz water test without difficulty.  I asked his wife to bring in denture adhesive so he can eat meals with dentures in place.  Pt describes no further pain upon swallowing.  Recommend continuing heart healthy diet, thin liquids, no SLP f/u warranted.      Aspiration Risk  No limitations    Diet Recommendation   Heart heathy, thin liquids  Medication Administration: Whole meds with liquid    Other  Recommendations Oral Care Recommendations: Oral care BID   Follow up Recommendations  None    Frequency and Duration            Prognosis        Swallow Study   General HPI: 64 year old male with PMH of GERD, OSA, COPD, admitted with dizziness, tachycardia and leg edema. Dx with acute cardiogenic/septic shock, acute diastolic CHF, A-flutter, intubated 6/13-6/18.  Initially assessed by SLP services 6/20 with findings of generally functional swallow.  Subsequently underwent TEE and converted to NSR; back to A. Fib with RVR, followed by cardiology regarding CHF and HR control. Developed lower GI bleed, flexible sigmoidoscopy significant for ischemic colitis.   Type of Study: Bedside Swallow Evaluation Previous Swallow Assessment:  (6/20) Diet Prior to this Study: Regular;Thin  liquids Temperature Spikes Noted: No Respiratory Status: Room air History of Recent Intubation: Yes Length of Intubations (days): 5 days Date extubated: 08/20/15 Behavior/Cognition: Alert;Cooperative;Pleasant mood Oral Cavity Assessment: Within Functional Limits Oral Care Completed by SLP: No Oral Cavity - Dentition: Dentures, not available Vision: Functional for self-feeding Self-Feeding Abilities: Able to feed self Patient Positioning: Upright in bed Baseline Vocal Quality: Normal Volitional Cough: Strong Volitional Swallow: Able to elicit    Oral/Motor/Sensory Function Overall Oral Motor/Sensory Function: Within functional limits   Ice Chips Ice chips: Within functional limits Presentation: Spoon   Thin Liquid Thin Liquid: Within functional limits Presentation: Cup;Straw    Nectar Thick Nectar Thick Liquid: Not tested   Honey Thick Honey Thick Liquid: Not tested   Puree Puree: Within functional limits Presentation: Self Fed;Spoon   Solid   GO   Solid:  (declined cracker, but stated he has been eating solids witho)       Samanthamarie Ezzell L. Tivis Ringer, [place] CCC/SLP Pager [phone]  Juan Quam Laurice 08/29/2015,4:56 PM

## 2015-08-29 NOTE — Progress Notes (Signed)
Complaining of difficulty swallowing, MD aware, speech therapy consult for evaluation.

## 2015-08-30 LAB — BASIC METABOLIC PANEL
Anion gap: 6 (ref 5–15)
BUN: 22 mg/dL — ABNORMAL HIGH (ref 6–20)
CO2: 24 mmol/L (ref 22–32)
Calcium: 7.9 mg/dL — ABNORMAL LOW (ref 8.9–10.3)
Chloride: 104 mmol/L (ref 101–111)
Creatinine, Ser: 1.9 mg/dL — ABNORMAL HIGH (ref 0.61–1.24)
GFR calc Af Amer: 42 mL/min — ABNORMAL LOW (ref >60)
GFR calc non Af Amer: 36 mL/min — ABNORMAL LOW (ref >60)
Glucose, Bld: 170 mg/dL — ABNORMAL HIGH (ref 65–99)
Potassium: 3.4 mmol/L — ABNORMAL LOW (ref 3.5–5.1)
Sodium: 134 mmol/L — ABNORMAL LOW (ref 135–145)

## 2015-08-30 LAB — HEPATIC FUNCTION PANEL
ALT: 24 U/L (ref 17–63)
AST: 21 U/L (ref 15–41)
Albumin: 1.9 g/dL — ABNORMAL LOW (ref 3.5–5.0)
Alkaline Phosphatase: 45 U/L (ref 38–126)
Bilirubin, Direct: 0.1 mg/dL (ref 0.1–0.5)
Indirect Bilirubin: 0.3 mg/dL (ref 0.3–0.9)
Total Bilirubin: 0.4 mg/dL (ref 0.3–1.2)
Total Protein: 4.8 g/dL — ABNORMAL LOW (ref 6.5–8.1)

## 2015-08-30 LAB — HEPARIN LEVEL (UNFRACTIONATED)
Heparin Unfractionated: <0.1 IU/mL — ABNORMAL LOW (ref 0.30–0.70)
Heparin Unfractionated: 0.28 IU/mL — ABNORMAL LOW (ref 0.30–0.70)

## 2015-08-30 LAB — PROTIME-INR
INR: 1.42 (ref 0.00–1.49)
Prothrombin Time: 17.4 seconds — ABNORMAL HIGH (ref 11.6–15.2)

## 2015-08-30 LAB — CBC
HCT: 29.7 % — ABNORMAL LOW (ref 39.0–52.0)
Hemoglobin: 9.6 g/dL — ABNORMAL LOW (ref 13.0–17.0)
MCH: 26.8 pg (ref 26.0–34.0)
MCHC: 32.3 g/dL (ref 30.0–36.0)
MCV: 83 fL (ref 78.0–100.0)
Platelets: 274 10*3/uL (ref 150–400)
RBC: 3.58 MIL/uL — ABNORMAL LOW (ref 4.22–5.81)
RDW: 17.7 % — ABNORMAL HIGH (ref 11.5–15.5)
WBC: 5.4 10*3/uL (ref 4.0–10.5)

## 2015-08-30 LAB — GLUCOSE, CAPILLARY
Glucose-Capillary: 222 mg/dL — ABNORMAL HIGH (ref 65–99)
Glucose-Capillary: 230 mg/dL — ABNORMAL HIGH (ref 65–99)
Glucose-Capillary: 131 mg/dL — ABNORMAL HIGH (ref 65–99)
Glucose-Capillary: 110 mg/dL — ABNORMAL HIGH (ref 65–99)

## 2015-08-30 LAB — MAGNESIUM: Magnesium: 1.6 mg/dL — ABNORMAL LOW (ref 1.7–2.4)

## 2015-08-30 MED ORDER — LEVALBUTEROL HCL 0.63 MG/3ML IN NEBU: 0.6300 mg | INHALATION_SOLUTION | RESPIRATORY_TRACT | Status: DC | PRN

## 2015-08-30 MED ORDER — POTASSIUM CHLORIDE CRYS ER 20 MEQ PO TBCR
40.0000 meq | EXTENDED_RELEASE_TABLET | Freq: Once | ORAL | Status: AC
  Administered 2015-08-30: 40 meq via ORAL
  Filled 2015-08-30: qty 2

## 2015-08-30 MED ORDER — PRO-STAT SUGAR FREE PO LIQD
30.0000 mL | Freq: Three times a day (TID) | ORAL | Status: DC
  Administered 2015-08-30 – 2015-09-08 (×19): 30 mL via ORAL
  Filled 2015-08-30 (×19): qty 30

## 2015-08-30 MED ORDER — AMIODARONE HCL 200 MG PO TABS
400.0000 mg | ORAL_TABLET | Freq: Two times a day (BID) | ORAL | Status: DC
  Administered 2015-08-30 – 2015-09-03 (×9): 400 mg via ORAL
  Filled 2015-08-30 (×10): qty 2

## 2015-08-30 MED ORDER — ADULT MULTIVITAMIN W/MINERALS CH
1.0000 | ORAL_TABLET | Freq: Every day | ORAL | Status: DC
  Administered 2015-08-30 – 2015-09-08 (×10): 1 via ORAL
  Filled 2015-08-30 (×10): qty 1

## 2015-08-30 MED ORDER — WARFARIN SODIUM 2.5 MG PO TABS
2.5000 mg | ORAL_TABLET | Freq: Once | ORAL | Status: AC
  Administered 2015-08-30: 2.5 mg via ORAL
  Filled 2015-08-30: qty 1

## 2015-08-30 MED ORDER — INSULIN GLARGINE 100 UNIT/ML ~~LOC~~ SOLN
34.0000 [IU] | Freq: Every day | SUBCUTANEOUS | Status: DC
  Administered 2015-08-31 – 2015-09-08 (×8): 34 [IU] via SUBCUTANEOUS
  Filled 2015-08-30 (×9): qty 0.34

## 2015-08-30 MED ORDER — MAGNESIUM SULFATE 2 GM/50ML IV SOLN
2.0000 g | Freq: Once | INTRAVENOUS | Status: AC
  Administered 2015-08-30: 2 g via INTRAVENOUS
  Filled 2015-08-30: qty 50

## 2015-08-30 NOTE — Progress Notes (Addendum)
MD called [facility] to follow up on necessity of keeping foley. MD at office was unaware of pt scenario and provided RN with Pager number [phone]) of Alexis Frock . When RN paged Tresa Moore, RN call was deferred back to [facility] office. RN will pass this information along so that tomorrow the RN may attempt to verify if keeping the foley is required.

## 2015-08-30 NOTE — Progress Notes (Signed)
Nutrition Follow-up  DOCUMENTATION CODES:   Morbid obesity  INTERVENTION:   -Continue Ensure Enlive po BID, each supplement provides 350 kcal and 20 grams of protein -30 ml Prostat TID, each supplement provides 100 kcals and 15 grams protein -MVI daily  NUTRITION DIAGNOSIS:   Inadequate oral intake related to  (decreased appetite) as evidenced by per patient/family report.  Ongoing  GOAL:   Patient will meet greater than or equal to 90% of their needs  Unmet  MONITOR:   PO intake, Supplement acceptance, I & O's, Labs  REASON FOR ASSESSMENT:   Ventilator    ASSESSMENT:   65 y/o M with admitted 6/12 to [facility] for atrial flutter, concern for UTI / sepsis. Decompensated 6/13 requiring vasopressors and intubation. 6/14 CT abd/pelvis >> distal proctocolitis  Pt in with MD at time of first visit and preparing for hydrotherapy treatment at time of second visit.   Pt underwent flex sigmoidoscopy on 08/25/15; per GI notes, biopsies revealed ischemic colitis.   Rectal tube removed 08/25/15.   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 Hydrotherapy treatments initiated on 08/29/15.   Pt remains with poor appetite. Noted meal completion 10-75%, averaging 30% meal completion. Case discussed with RN. Pt was receiving both Glucerna and Ensure supplements; pt accpeting Ensure currently. Agree with Ensure supplement, due to increased nutrient density in light of poor po intake and increased nutrient needs in the presence of wounds.   Labs reviewed: Na: 134, K: 3.4 (on PO supplement intake), CBGS: 170-222.   Diet Order:  Diet Heart Room service appropriate?: Yes; Fluid consistency:: Thin  Skin:  Wound (see comment) (UN gluteal cleft)  Last BM:  08/29/15  Height:   Ht Readings from Last 1 Encounters:  08/15/15 5\' 6"  (1.676 m)    Weight:   Wt Readings from Last 1 Encounters:  08/30/15 261 lb (118.389 kg)    Ideal Body Weight:  64.5  kg  BMI:  Body mass index is 42.15 kg/(m^2).  Estimated Nutritional Needs:   Kcal:  2100-2300  Protein:  110-125 grams  Fluid:  2 L/day  EDUCATION NEEDS:   No education needs identified at this time  Leisha Trinkle A. Jimmye Norman, RD, LDN, CDE Pager: [phone] After hours Pager: [phone]

## 2015-08-30 NOTE — Progress Notes (Addendum)
ANTICOAGULATION CONSULT NOTE - Follow Up Consult  Pharmacy Consult for coumadin, heparin  Indication: atrial fibrillation  No Known Allergies  Patient Measurements: Height: 5\' 6"  (167.6 cm) Weight: 261 lb (118.389 kg) IBW/kg (Calculated) : 63.8 Heparin Dosing Weight: 94kg  Vital Signs: Temp: 98.6 F (37 C) (06/28 1211) Temp Source: Oral (06/28 1211) BP: 112/62 mmHg (06/28 1211) Pulse Rate: 41 (06/28 1211)  Labs:  Recent Labs  08/28/15 1141 08/28/15 1251  08/29/15 0600 08/29/15 0605 08/29/15 1758 08/30/15 0230 08/30/15 0300  HGB  --  11.9*  --  10.7*  --   --   --  9.6*  HCT  --  36.2*  --  32.7*  --   --   --  29.7*  PLT  --  296  --  308  --   --   --  274  LABPROT  --  17.3*  --  17.6*  --   --   --  17.4*  INR  --  1.40  --  1.43  --   --   --  1.42  HEPARINUNFRC  --   --   < >  --  <0.10* <0.10* <0.10*  --   CREATININE 1.78*  --   --  1.94*  --   --   --  1.90*  < > = values in this interval not displayed.  Estimated Creatinine Clearance: 48.2 mL/min (by C-G formula based on Cr of 1.9).   Medications:  Scheduled:  . acyclovir ointment   Topical 6 X Daily  . amiodarone  400 mg Oral BID  . collagenase   Topical Daily  . diltiazem  60 mg Oral Q6H  . feeding supplement (ENSURE ENLIVE)  237 mL Oral TID BM  . feeding supplement (PRO-STAT SUGAR FREE 64)  30 mL Oral TID BM  . furosemide  40 mg Oral Daily  . hydrALAZINE  75 mg Oral Q8H  . insulin aspart  0-15 Units Subcutaneous TID WC  . [START ON 08/31/2015] insulin glargine  34 Units Subcutaneous Daily  . isosorbide mononitrate  60 mg Oral Daily  . metoprolol tartrate  100 mg Oral BID  . multivitamin with minerals  1 tablet Oral Daily  . oxybutynin  5 mg Oral TID  . pantoprazole  40 mg Oral Daily  . prochlorperazine  5 mg Intravenous Once  . Warfarin - Pharmacist Dosing Inpatient   Does not apply q1800    Assessment: 69 YOM admitted 08/14/2015 with newly diagnosed AFlutter with RVR (s/p DCCV on 6/20).  Pharmacy consulted to dose coumadin. INR up to 6.36 and Vitamin K 5mg  IV given x 1 (6/24).He is noted with recent a lower GI bleed (Flex sigmoidoscopy 6/23 w/ biopsy)on heparin and coumadin (spoke with Dr. [facility]). Will keep on the lower end of goal for heparin and coumadin.  -INR= 1.42; hg= 9.6, plt= 274  Goal of Therapy:  Heparin level 0.3-0.5 units/ml  INR goal 2-2.5 Monitor platelets by anticoagulation protocol: Yes   Plan:  -Coumadin 2.5mg  po today -Daily INR  Hildred Laser, Pharm D 08/30/2015 1:39 PM  Addendum -Heparin level= 0.28  Plan -Increase heparin to 1800 units/hr -Recheck in 8 hrs -daily heparin level and CBC  Hildred Laser, Pharm D 08/30/2015 3:13 PM

## 2015-08-30 NOTE — Progress Notes (Signed)
Physical Therapy Treatment Patient Details Name: Frank Hoover MRN: [id number] DOB: 1950/10/06 Today's Date: 08/30/2015    History of Present Illness 65 year old male with PMH of GERD, OSA, COPD, admitted with dizziness, tachycardia and leg edema. Dx with acute cardiogenic/septic shock, acute diastolic CHF, A-flutter, intubated 6/13-6/18. Pt underwent TEE with cardioversion 6/20. Developed significant anemia, Hemoccult-positive stool S/P Flex Sig 6/23, suspicious for ischemic colitis.    PT Comments    Frank Hoover is progressing slowly due to +dizziness when standing.  Pt took side steps at bedside w/ min +2 assist to steady and to manage RW.  Systolic BP dropped 13 w/ sit>stand and pt sits as he was unable to tolerate standing.  Pt remains motivated and remains appropriate for CIR.   Follow Up Recommendations  CIR     Equipment Recommendations  Other (comment) (TBD at next venue of care)    Recommendations for Other Services       Precautions / Restrictions Precautions Precautions: Fall Restrictions Weight Bearing Restrictions: No    Mobility  Bed Mobility Overal bed mobility: Needs Assistance;+2 for physical assistance Bed Mobility: Supine to Sit;Sit to Supine     Supine to sit: Mod assist;+2 for physical assistance;HOB elevated Sit to supine: Min assist;+2 for physical assistance   General bed mobility comments: Assist to elevate trunk and use of bed pad for positioning to sit EOB.  Assist w/ LEs and trunk to return to supine.  Transfers Overall transfer level: Needs assistance Equipment used: Rolling walker (2 wheeled) Transfers: Sit to/from Stand Sit to Stand: Min assist;+2 physical assistance         General transfer comment: Assist to boost from bed and to steady.  Pt stood 30 seconds on first sit>stand and 3 minutes on seconds sit>stand.  Limited due to dizziness.  Systolic dropped 13 w/ sit>stand.  HR up to 124  Ambulation/Gait Ambulation/Gait  assistance: Min assist;+2 physical assistance Ambulation Distance (Feet): 3 Feet Assistive device: Rolling walker (2 wheeled) Gait Pattern/deviations:  (side steps)   Gait velocity interpretation: Below normal speed for age/gender General Gait Details: Pt took side steps at bedside w/ assist to steady and to manage RW.   Stairs            Wheelchair Mobility    Modified Rankin (Stroke Patients Only)       Balance Overall balance assessment: Needs assistance Sitting-balance support: Feet supported;No upper extremity supported Sitting balance-Leahy Scale: Fair     Standing balance support: Bilateral upper extremity supported;During functional activity Standing balance-Leahy Scale: Poor Standing balance comment: Relies on UE support from RW and physical assist                    Cognition Arousal/Alertness: Awake/alert Behavior During Therapy: WFL for tasks assessed/performed Overall Cognitive Status: Impaired/Different from baseline Area of Impairment: Following commands;Problem solving;Memory     Memory: Decreased short-term memory Following Commands: Follows one step commands with increased time     Problem Solving: Slow processing;Decreased initiation;Difficulty sequencing;Requires verbal cues      Exercises General Exercises - Lower Extremity Ankle Circles/Pumps: AROM;Both;10 reps;Supine Heel Slides: AROM;Both;5 reps;Supine    General Comments General comments (skin integrity, edema, etc.): Pillow placed under Rt side and RN reminded that pt should be rotated q2h.      Pertinent Vitals/Pain Pain Assessment: Faces Faces Pain Scale: Hurts even more Pain Location: buttocks Pain Descriptors / Indicators: Grimacing;Discomfort Pain Intervention(s): Limited activity within patient's tolerance;Monitored during session;Repositioned  Home Living                      Prior Function            PT Goals (current goals can now be found in  the care plan section) Acute Rehab PT Goals Patient Stated Goal: to get stronger PT Goal Formulation: With patient Time For Goal Achievement: 09/05/15 Potential to Achieve Goals: Good Progress towards PT goals: Progressing toward goals    Frequency  Min 3X/week    PT Plan Current plan remains appropriate    Co-evaluation             End of Session Equipment Utilized During Treatment: Gait belt Activity Tolerance: Treatment limited secondary to medical complications (Comment) (+dizziness) Patient left: in bed;with call bell/phone within reach;with bed alarm set;with family/visitor present     Time: 9628-3662 PT Time Calculation (min) (ACUTE ONLY): 33 min  Charges:  $Therapeutic Exercise: 8-22 mins $Therapeutic Activity: 8-22 mins                    G Codes:      Frank Hoover PT, DPT  Pager: [phone] Phone: [phone] 08/30/2015, 2:06 PM

## 2015-08-30 NOTE — Progress Notes (Addendum)
PROGRESS NOTE  Frank Hoover  MHD:[id number] DOB: [birth year] DOA: 08/14/2015 PCP: Alonza Bogus, MD    Brief Narrative:  65 year old male originally seen by his Nephrologist in [place], found to be in A. fib with RVR , admitted to [facility] on 08/15/15.  2-D echo showed EF 25%, and he developed septic and cardiogenic shock, w/ temp 104.  He was transferred to [facility] at [facility] and treated for bacteremia due to UTI, requiring pressor support.  He required intubation, and was successfully extubated on 6/19.  He was transferred to [facility] on 6/20.   Patient was seen by Cardiology and EP for atrial flutter and SVT.  He converted to NSR after TEE DCCV on 6/20, and unfortunately went back into A. fib with RVR.  While on anticoagulation for atrial fibrillation, he developed bright red blood per rectum.  Flexible sigmoidoscopy was significant for ischemic colitis.  Assessment & Plan:   Acute hypoxic resp failure - With history of OSA, COPD, and cardiogenic/septic shock - CPAP QHS for sleep apnea  Septic shock / Citrobacter bacteremia in the setting of UTI - Septic shock resolved - Blood cultures and urine culture growing Citrobacter - to complete total of 2 weeks treatment on 6/28 -  repeat blood cultures 6/22: One bottle out of 2 sets growing micrococcus species, most likely contamination  Lower GI bleed secondary to ischemic colitis - Secondary to ischemic colitis as confirmed by biopsy ,status post flexible sigmoidoscopy on 6/23 - transfused 2 units PRBC on 6/25 - continue with heparin GTT > resumed warfarin as discussed with GI  HCAP - has completed tx   Cardiogenic shock / acute  systolic CHF with EF 21% - management per Cardiology - Diuresis per cardiology - weight trending up - not on ACEI/ARB/spironolactone, aldactone due to ARF Filed Weights   08/28/15 0300 08/29/15 0400 08/30/15 0400  Weight: 114.5 kg (252 lb 6.8 oz) 115.5 kg (254 lb 10.1 oz) 118.389 kg (261 lb)    Atrial fib  with RVR  - Management per cardiology - status post s/p TEE/DCCV 08/22/15, converted to NSR, but unfortunately currently back in A. fib with RVR, management per cardiology - currently on amiodarone drip - continue with heparin GTT > warfarin  Episode of SVT on 08/23/18 - required carotid massage, IV adenosine, EP consult appreciated  HTN - Blood pressure acceptable  AKI on CKD Stage 3 - Continue to monitor - improving despite diuresis - crt peaked at 5.32  Anemia  - due to chronic kidney disease, chronic illness, and GI bleed - slowly drifting down - follow trend   DM  - CBG poorly controlled - adjust tx and follow trend   Urinary retention -foley per urology, to remain for several weeks to allow urethra to heal per urology rec  Acute metabolic encephalopathy - much improved today   Unstageable Pressure Injury gluteal cleft  - Wound care following   DVT prophylaxis:  on heparin GTT  Code Status: Full Code  Family Communication: none at bedside  Disposition Plan:  Appears stable for transfer to tele bed but is on amio gtt therefore remain in SDU for now - PT/OT - cont hydrotherapy to gluteal wound   Consultants:  Cards PCCM Urology Inpatient rehabilitation  Procedures:  6/13 R Femoral CVL > 6/13 6/13 ETT > 6/18 6/13 Lt radial aline > 6/17 6/13 RIJ CVL > 6/19 6/25 2 units PRBC transfusion  Subjective: Resting comfortably in bed.  Denies cp, sob, n/v, or abdom pain.  Objective: Filed Vitals:   08/30/15 0400 08/30/15 0509 08/30/15 0800 08/30/15 0836  BP: 131/82 140/98 132/84 132/84  Pulse: 125  128 83  Temp: 98.3 F (36.8 C)   98.1 F (36.7 C)  TempSrc: Oral   Oral  Resp: 18  22 19   Height:      Weight: 118.389 kg (261 lb)     SpO2: 99%  99% 100%    Intake/Output Summary (Last 24 hours) at 08/30/15 0946 Last data filed at 08/30/15 0618  Gross per 24 hour  Intake 1385.3 ml  Output   1550 ml  Net -164.7 ml   Filed Weights   08/28/15 0300  08/29/15 0400 08/30/15 0400  Weight: 114.5 kg (252 lb 6.8 oz) 115.5 kg (254 lb 10.1 oz) 118.389 kg (261 lb)    Examination:  General exam: alert and conversant  Respiratory system: distant bs th/o all fields - no wheeze or focal crackles  Cardiovascular system: Irregular - rate controlled - no M or rub  Gastrointestinal system: obese, soft, BS+, no rebound, mildly tender to deep palpation diffusely Lower extremity: trace B LE edema - no cyanosis   Data Reviewed:   CBC:  Recent Labs Lab 08/27/15 0326 08/27/15 1827 08/28/15 1251 08/29/15 0600 08/30/15 0300  WBC 5.0 7.0 6.9 6.3 5.4  HGB 7.5* 11.0* 11.9* 10.7* 9.6*  HCT 24.4* 33.6* 36.2* 32.7* 29.7*  MCV 89.7 84.0 84.6 84.3 83.0  PLT 292 319 296 308 093   Basic Metabolic Panel:  Recent Labs Lab 08/24/15 0247  08/26/15 0620 08/27/15 0326 08/28/15 1141 08/29/15 0600 08/30/15 0300  NA 135  < > 136 135 137 138 134*  K 3.1*  < > 3.7 3.2* 3.8 3.6 3.4*  CL 102  < > 105 104 106 103 104  CO2 25  < > 22 24 23 26 24   GLUCOSE 161*  < > 191* 154* 202* 193* 170*  BUN 61*  < > 31* 23* 18 18 22*  CREATININE 2.73*  < > 2.06* 1.91* 1.78* 1.94* 1.90*  CALCIUM 8.0*  < > 8.5* 8.0* 8.4* 8.4* 7.9*  MG 1.8  --   --   --  1.6*  --  1.6*  PHOS  --   --   --   --  2.5  --   --   < > = values in this interval not displayed. GFR: Estimated Creatinine Clearance: 48.2 mL/min (by C-G formula based on Cr of 1.9).   Liver Function Tests:  Recent Labs Lab 08/25/15 0423 08/30/15 0300  AST 38 21  ALT 41 24  ALKPHOS 57 45  BILITOT 0.1* 0.4  PROT 5.4* 4.8*  ALBUMIN 1.8* 1.9*   Coagulation Profile:  Recent Labs Lab 08/25/15 0423 08/26/15 0620 08/27/15 0326 08/28/15 1251 08/29/15 0600  INR 3.69* 6.36* 1.34 1.40 1.43   CBG:  Recent Labs Lab 08/29/15 0754 08/29/15 1253 08/29/15 1706 08/29/15 2131 08/30/15 0838  GLUCAP 198* 353* 91 170* 222*   Urine analysis:    Component Value Date/Time   COLORURINE AMBER* 08/14/2015 2125     APPEARANCEUR HAZY* 08/14/2015 2125   LABSPEC 1.015 08/14/2015 2125   PHURINE 5.0 08/14/2015 2125   GLUCOSEU 100* 08/14/2015 2125   HGBUR LARGE* 08/14/2015 2125   BILIRUBINUR NEGATIVE 08/14/2015 2125   [place] NEGATIVE 08/14/2015 2125   PROTEINUR 100* 08/14/2015 2125   UROBILINOGEN 0.2 12/02/2013 1739   NITRITE NEGATIVE 08/14/2015 2125   LEUKOCYTESUR MODERATE* 08/14/2015 2125    Recent Results (from  the past 240 hour(s))  C difficile quick scan w PCR reflex     Status: None   Collection Time: 08/23/15  4:14 PM  Result Value Ref Range Status   C Diff antigen NEGATIVE NEGATIVE Final   C Diff toxin NEGATIVE NEGATIVE Final   C Diff interpretation Negative for toxigenic C. difficile  Final  Gastrointestinal Panel by PCR , Stool     Status: None   Collection Time: 08/24/15 11:29 AM  Result Value Ref Range Status   Campylobacter species NOT DETECTED NOT DETECTED Final   Plesimonas shigelloides NOT DETECTED NOT DETECTED Final   Salmonella species NOT DETECTED NOT DETECTED Final   Yersinia enterocolitica NOT DETECTED NOT DETECTED Final   Vibrio species NOT DETECTED NOT DETECTED Final   Vibrio cholerae NOT DETECTED NOT DETECTED Final   Enteroaggregative E coli (EAEC) NOT DETECTED NOT DETECTED Final   Enteropathogenic E coli (EPEC) NOT DETECTED NOT DETECTED Final   Enterotoxigenic E coli (ETEC) NOT DETECTED NOT DETECTED Final   Shiga like toxin producing E coli (STEC) NOT DETECTED NOT DETECTED Final   E. coli O157 NOT DETECTED NOT DETECTED Final   Shigella/Enteroinvasive E coli (EIEC) NOT DETECTED NOT DETECTED Final   Cryptosporidium NOT DETECTED NOT DETECTED Final   Cyclospora cayetanensis NOT DETECTED NOT DETECTED Final   Entamoeba histolytica NOT DETECTED NOT DETECTED Final   Giardia lamblia NOT DETECTED NOT DETECTED Final   Adenovirus F40/41 NOT DETECTED NOT DETECTED Final   Astrovirus NOT DETECTED NOT DETECTED Final   Norovirus GI/GII NOT DETECTED NOT DETECTED Final    Rotavirus A NOT DETECTED NOT DETECTED Final   Sapovirus (I, II, IV, and V) NOT DETECTED NOT DETECTED Final  Culture, blood (Routine X 2) w Reflex to ID Panel     Status: None   Collection Time: 08/24/15 12:20 PM  Result Value Ref Range Status   Specimen Description BLOOD LEFT HAND  Final   Special Requests BOTTLES DRAWN AEROBIC ONLY 10CC  Final   Culture NO GROWTH 5 DAYS  Final   Report Status 08/29/2015 FINAL  Final  Culture, blood (Routine X 2) w Reflex to ID Panel     Status: Abnormal   Collection Time: 08/24/15 12:40 PM  Result Value Ref Range Status   Specimen Description BLOOD LEFT HAND  Final   Special Requests IN PEDIATRIC BOTTLE 2.5CC  Final   Culture  Setup Time   Final    GRAM POSITIVE COCCI IN CLUSTERS AEROBIC BOTTLE ONLY CRITICAL RESULT CALLED TO, READ BACK BY AND VERIFIED WITH: JAMES LEDFORD,PHARMD @0113  08/26/15 MKELLY    Culture (A)  Final    MICROCOCCUS SPECIES Standardized susceptibility testing for this organism is not available.    Report Status 08/28/2015 FINAL  Final  Blood Culture ID Panel (Reflexed)     Status: None   Collection Time: 08/24/15 12:40 PM  Result Value Ref Range Status   Enterococcus species NOT DETECTED NOT DETECTED Final   Vancomycin resistance NOT DETECTED NOT DETECTED Final   Listeria monocytogenes NOT DETECTED NOT DETECTED Final   Staphylococcus species NOT DETECTED NOT DETECTED Final   Staphylococcus aureus NOT DETECTED NOT DETECTED Final   Methicillin resistance NOT DETECTED NOT DETECTED Final   Streptococcus species NOT DETECTED NOT DETECTED Final   Streptococcus agalactiae NOT DETECTED NOT DETECTED Final   Streptococcus pneumoniae NOT DETECTED NOT DETECTED Final   Streptococcus pyogenes NOT DETECTED NOT DETECTED Final   Acinetobacter baumannii NOT DETECTED NOT DETECTED Final   Enterobacteriaceae  species NOT DETECTED NOT DETECTED Final   Enterobacter cloacae complex NOT DETECTED NOT DETECTED Final   Escherichia coli NOT DETECTED  NOT DETECTED Final   Klebsiella oxytoca NOT DETECTED NOT DETECTED Final   Klebsiella pneumoniae NOT DETECTED NOT DETECTED Final   Proteus species NOT DETECTED NOT DETECTED Final   Serratia marcescens NOT DETECTED NOT DETECTED Final   Carbapenem resistance NOT DETECTED NOT DETECTED Final   Haemophilus influenzae NOT DETECTED NOT DETECTED Final   Neisseria meningitidis NOT DETECTED NOT DETECTED Final   Pseudomonas aeruginosa NOT DETECTED NOT DETECTED Final   Candida albicans NOT DETECTED NOT DETECTED Final   Candida glabrata NOT DETECTED NOT DETECTED Final   Candida krusei NOT DETECTED NOT DETECTED Final   Candida parapsilosis NOT DETECTED NOT DETECTED Final   Candida tropicalis NOT DETECTED NOT DETECTED Final      Anti-infectives    Start     Dose/Rate Route Frequency Ordered Stop   08/27/15 1100  vancomycin (VANCOCIN) 1,250 mg in sodium chloride 0.9 % 250 mL IVPB  Status:  Discontinued     1,250 mg 166.7 mL/hr over 90 Minutes Intravenous Every 24 hours 08/27/15 0842 08/28/15 1125   08/26/15 0130  vancomycin (VANCOCIN) 1,500 mg in sodium chloride 0.9 % 500 mL IVPB  Status:  Discontinued     1,500 mg 250 mL/hr over 120 Minutes Intravenous Every 48 hours 08/26/15 0121 08/27/15 0841   08/24/15 1200  cefTRIAXone (ROCEPHIN) 2 g in dextrose 5 % 50 mL IVPB     2 g 100 mL/hr over 30 Minutes Intravenous Every 24 hours 08/24/15 1055 08/31/15 1159   08/20/15 0000  cefTRIAXone (ROCEPHIN) 2 g in dextrose 5 % 50 mL IVPB  Status:  Discontinued     2 g 100 mL/hr over 30 Minutes Intravenous Every 24 hours 08/19/15 1112 08/22/15 1310   08/18/15 1100  cefTAZidime (FORTAZ) 2 g in dextrose 5 % 50 mL IVPB  Status:  Discontinued     2 g 100 mL/hr over 30 Minutes Intravenous Every 24 hours 08/18/15 1039 08/19/15 1112   08/17/15 1800  vancomycin (VANCOCIN) 1,500 mg in sodium chloride 0.9 % 500 mL IVPB  Status:  Discontinued     1,500 mg 250 mL/hr over 120 Minutes Intravenous Every 48 hours 08/15/15 1636  08/15/15 1956   08/16/15 2100  ceFEPIme (MAXIPIME) 1 g in dextrose 5 % 50 mL IVPB  Status:  Discontinued     1 g 100 mL/hr over 30 Minutes Intravenous Every 24 hours 08/15/15 1957 08/16/15 1415   08/16/15 2100  ceFEPIme (MAXIPIME) 2 g in dextrose 5 % 50 mL IVPB  Status:  Discontinued     2 g 100 mL/hr over 30 Minutes Intravenous Every 24 hours 08/16/15 1415 08/16/15 1741   08/16/15 1900  piperacillin-tazobactam (ZOSYN) IVPB 2.25 g  Status:  Discontinued     2.25 g 100 mL/hr over 30 Minutes Intravenous Every 8 hours 08/16/15 1809 08/18/15 1014   08/15/15 2100  ceFEPIme (MAXIPIME) 2 g in dextrose 5 % 50 mL IVPB     2 g 100 mL/hr over 30 Minutes Intravenous  Once 08/15/15 1957 08/15/15 2359   08/15/15 1730  vancomycin (VANCOCIN) 2,000 mg in sodium chloride 0.9 % 500 mL IVPB     2,000 mg 250 mL/hr over 120 Minutes Intravenous  Once 08/15/15 1636 08/15/15 2000   08/15/15 0900  piperacillin-tazobactam (ZOSYN) IVPB 3.375 g  Status:  Discontinued     3.375 g 12.5 mL/hr over 240  Minutes Intravenous Every 8 hours 08/15/15 0856 08/15/15 1956   08/15/15 0100  vancomycin (VANCOCIN) IVPB 1000 mg/200 mL premix     1,000 mg 200 mL/hr over 60 Minutes Intravenous STAT 08/15/15 0048 08/15/15 0206   08/14/15 2330  vancomycin (VANCOCIN) IVPB 1000 mg/200 mL premix     1,000 mg 200 mL/hr over 60 Minutes Intravenous STAT 08/14/15 2316 08/15/15 0041   08/14/15 2330  piperacillin-tazobactam (ZOSYN) IVPB 3.375 g     3.375 g 12.5 mL/hr over 240 Minutes Intravenous STAT 08/14/15 2317 08/15/15 0341     Scheduled Meds: . acyclovir ointment   Topical 6 X Daily  . cefTRIAXone (ROCEPHIN)  IV  2 g Intravenous Q24H  . collagenase   Topical Daily  . diltiazem  60 mg Oral Q6H  . feeding supplement (ENSURE ENLIVE)  237 mL Oral TID BM  . furosemide  40 mg Oral Daily  . hydrALAZINE  75 mg Oral Q8H  . insulin aspart  0-15 Units Subcutaneous TID WC  . insulin glargine  32 Units Subcutaneous Daily  . isosorbide  mononitrate  60 mg Oral Daily  . metoprolol tartrate  100 mg Oral BID  . oxybutynin  5 mg Oral TID  . pantoprazole  40 mg Oral Daily  . prochlorperazine  5 mg Intravenous Once  . sodium chloride flush  3 mL Intravenous Q12H  . Warfarin - Pharmacist Dosing Inpatient   Does not apply q1800   Continuous Infusions: . sodium chloride Stopped (08/29/15 0000)  . sodium chloride Stopped (08/21/15 1617)  . amiodarone 30 mg/hr (08/30/15 0242)  . heparin 1,700 Units/hr (08/30/15 0912)     LOS: 16 days    Time spent: 37 min  Cherene Altes, MD [facility] Office  [phone] Pager - Text Page per Amion as per below:  On-Call/Text Page:      [email or web address]      password [email or web address]  If 7PM-7AM, please contact night-coverage [email or web address] Password [facility] 08/30/2015, 10:18 AM

## 2015-08-30 NOTE — Progress Notes (Signed)
Physical Therapy Wound Treatment Patient Details  Name: DECLYN DELSOL MRN: [id number] Date of Birth: [birth year]  Today's Date: 08/30/2015 Time: 3818-4037 Time Calculation (min): 32 min  Subjective  Subjective: Pt pleasant and agreeable to therapy Patient and Family Stated Goals: Did not state Date of Onset:  (Unknown)  Pain Score: Pt did not report any pain throughout session.  Wound Assessment  Pressure Ulcer 08/29/15 Unstageable - Full thickness tissue loss in which the base of the ulcer is covered by slough (yellow, tan, gray, green or brown) and/or eschar (tan, brown or black) in the wound bed. 100% yellow-per staff started as blister (Active)  Dressing Type ABD;Barrier Film (skin prep);Gauze (Comment);Moist to dry 08/30/2015 12:27 PM  Dressing Changed;Clean;Dry;Intact 08/30/2015 12:27 PM  Dressing Change Frequency Daily 08/30/2015 12:27 PM  State of Healing Eschar 08/30/2015 12:27 PM  Site / Wound Assessment Yellow;Pink 08/30/2015 12:27 PM  % Wound base Red or Granulating 5% 08/30/2015 12:27 PM  % Wound base Yellow 95% 08/30/2015 12:27 PM  % Wound base Black 0% 08/30/2015 12:27 PM  Peri-wound Assessment Intact 08/30/2015 12:27 PM  Wound Length (cm) 9.5 cm 08/29/2015  2:00 PM  Wound Width (cm) 5.8 cm 08/29/2015  2:00 PM  Wound Depth (cm) 0.1 cm 08/29/2015  2:00 PM  Drainage Amount Minimal 08/30/2015 12:27 PM  Drainage Description Purulent 08/30/2015 12:27 PM  Treatment Debridement (Selective);Hydrotherapy (Pulse lavage);Packing (Saline gauze) 08/30/2015 12:27 PM  Santyl applied to wound bed prior to applying dressing.   Hydrotherapy Pulsed lavage therapy - wound location: Gluteal cleft Pulsed Lavage with Suction (psi): 12 psi Pulsed Lavage with Suction - Normal Saline Used: 1000 mL Pulsed Lavage Tip: Tip with splash shield Selective Debridement Selective Debridement - Location: Gluteal cleft Selective Debridement - Tools Used: Scalpel;Forceps Selective Debridement - Tissue Removed:  yellow necrotic tissue   Wound Assessment and Plan  Wound Therapy - Assess/Plan/Recommendations Wound Therapy - Clinical Statement: Pt presents to hydrotherapy with a pressure injury to the gluteal cleft. This patient will benefit from continued hydrotherapy for selective removal of necrotic tissue and to promote wound healing.  Wound Therapy - Functional Problem List: Decreased tolerance of OOB, acute pain.  Factors Delaying/Impairing Wound Healing: Altered sensation;Diabetes Mellitus;Immobility;Multiple medical problems Hydrotherapy Plan: Debridement;Dressing change;Patient/family education;Pulsatile lavage with suction Wound Therapy - Frequency: 6X / week Wound Therapy - Follow Up Recommendations: Other (comment) (CIR) Wound Plan: See above  Wound Therapy Goals- Improve the function of patient's integumentary system by progressing the wound(s) through the phases of wound healing (inflammation - proliferation - remodeling) by: Decrease Necrotic Tissue to: 25% Decrease Necrotic Tissue - Progress: Progressing toward goal Increase Granulation Tissue to: 75% Increase Granulation Tissue - Progress: Progressing toward goal Goals/treatment plan/discharge plan were made with and agreed upon by patient/family: Yes Time For Goal Achievement: 7 days Wound Therapy - Potential for Goals: Good  Goals will be updated until maximal potential achieved or discharge criteria met.  Discharge criteria: when goals achieved, discharge from hospital, MD decision/surgical intervention, no progress towards goals, refusal/missing three consecutive treatments without notification or medical reason.  GP     Rolinda Roan 08/30/2015, 12:30 PM   Rolinda Roan, PT, DPT Acute Rehabilitation Services Pager: [phone]

## 2015-08-30 NOTE — Progress Notes (Signed)
EKG CRITICAL VALUE     12 lead EKG performed.  Critical value noted.Verlin Grills, RN notified.   Yehuda Mao, [place] 08/30/2015 7:24 AM

## 2015-08-30 NOTE — Care Management Important Message (Signed)
Important Message  Patient Details  Name: Frank Hoover MRN: [id number] Date of Birth: February 17, 1951   Medicare Important Message Given:  Yes    Loann Quill 08/30/2015, 9:54 AM

## 2015-08-30 NOTE — Progress Notes (Signed)
Subjective:  No chest pain; breathing better  Objective:   Vital Signs : Filed Vitals:   08/30/15 0400 08/30/15 0509 08/30/15 0800 08/30/15 0836  BP: 131/82 140/98 132/84 132/84  Pulse: 125  128 83  Temp: 98.3 F (36.8 C)   98.1 F (36.7 C)  TempSrc: Oral   Oral  Resp: _0 Height:      Weight: 261 lb (118.389 kg)     SpO2: 99%  99% 100%    Intake/Output from previous day:  Intake/Output Summary (Last 24 hours) at 08/30/15 1044 Last data filed at 08/30/15 0618  Gross per 24 hour  Intake 1359.6 ml  Output   1550 ml  Net -190.4 ml    I/O since admission: -7757  Wt Readings from Last 3 Encounters:  08/30/15 261 lb (118.389 kg)  06/16/15 262 lb (118.842 kg)  06/22/14 265 lb (120.203 kg)    Medications: . acyclovir ointment   Topical 6 X Daily  . cefTRIAXone (ROCEPHIN)  IV  2 g Intravenous Q24H  . collagenase   Topical Daily  . diltiazem  60 mg Oral Q6H  . feeding supplement (ENSURE ENLIVE)  237 mL Oral TID BM  . furosemide  40 mg Oral Daily  . hydrALAZINE  75 mg Oral Q8H  . insulin aspart  0-15 Units Subcutaneous TID WC  . [START ON 08/31/2015] insulin glargine  34 Units Subcutaneous Daily  . isosorbide mononitrate  60 mg Oral Daily  . magnesium sulfate 1 - 4 g bolus IVPB  2 g Intravenous Once  . metoprolol tartrate  100 mg Oral BID  . oxybutynin  5 mg Oral TID  . pantoprazole  40 mg Oral Daily  . potassium chloride  40 mEq Oral Once  . prochlorperazine  5 mg Intravenous Once  . Warfarin - Pharmacist Dosing Inpatient   Does not apply q1800    . sodium chloride Stopped (08/21/15 1617)  . amiodarone 30 mg/hr (08/30/15 0242)  . heparin 1,700 Units/hr (08/30/15 0912)    Physical Exam:   General appearance: no distress Neck: no adenopathy, supple, symmetrical, trachea midline, thyroid not enlarged, symmetric, no tenderness/mass/nodules and JVD 8 cm Lungs: clear to auscultation bilaterally Heart: regular, tachycardic at 017; 1/6 systolic murmur; no  rub Abdomen: soft, non-tender; bowel sounds normal; no masses,  no organomegaly Extremities: no edema, redness or tenderness in the calves or thighs Pulses: 2+ and symmetric Skin: Skin color, texture, turgor normal. No rashes or lesions Neurologic: Grossly normal   Rate: 117  Rhythm: atrial flutter   6/28 ECG (independently read by me): A Flutter 2:1 rate 130  08/26/15 ECG (independently read by me): Accelerated junctional at 120; Q's 3, aVF. Increased QTc.  Lab Results:   Recent Labs  08/28/15 1141 08/29/15 0600 08/30/15 0300  NA 137 138 134*  K 3.8 3.6 3.4*  CL 106 103 104  CO2 _1 GLUCOSE 202* 193* 170*  BUN 18 18 22*  CREATININE 1.78* 1.94* 1.90*  CALCIUM 8.4* 8.4* 7.9*  MG 1.6*  --  1.6*  PHOS 2.5  --   --     Hepatic Function Latest Ref Rng 08/30/2015 08/25/2015 08/18/2015  Total Protein 6.5 - 8.1 g/dL 4.8(L) 5.4(L) -  Albumin 3.5 - 5.0 g/dL 1.9(L) 1.8(L) 1.5(L)  AST 15 - 41 U/L 21 38 -  ALT 17 - 63 U/L 24 41 -  Alk Phosphatase 38 - 126 U/L 45 57 -  Total Bilirubin 0.3 -  1.2 mg/dL 0.4 0.1(L) -  Bilirubin, Direct 0.1 - 0.5 mg/dL 0.1 0.1 -     Recent Labs  08/28/15 1251 08/29/15 0600 08/30/15 0300  WBC 6.9 6.3 5.4  HGB 11.9* 10.7* 9.6*  HCT 36.2* 32.7* 29.7*  MCV 84.6 84.3 83.0  PLT 296 308 274    No results for input(s): TROPONINI in the last 72 hours.  Invalid input(s): CK, MB  Lab Results  Component Value Date   TSH 1.917 08/14/2015   No results for input(s): HGBA1C in the last 72 hours.   Recent Labs  08/30/15 0300  PROT 4.8*  ALBUMIN 1.9*  AST 21  ALT 24  ALKPHOS 45  BILITOT 0.4  BILIDIR 0.1  IBILI 0.3    Recent Labs  08/30/15 0300  INR 1.42   BNP (last 3 results)  Recent Labs  08/14/15 1501  BNP 609.0*    ProBNP (last 3 results) No results for input(s): PROBNP in the last 8760 hours.   Lipid Panel     Component Value Date/Time   CHOL 79 08/15/2015 0422   TRIG 38 08/15/2015 0422   HDL 34* 08/15/2015 0422     CHOLHDL 2.3 08/15/2015 0422   VLDL 8 08/15/2015 0422   LDLCALC 37 08/15/2015 0422      Imaging:  No results found.    Assessment/Plan:   Principal Problem:   Atrial flutter with rapid ventricular response (HCC) Active Problems:   GERD   DM (diabetes mellitus) (HCC)   HTN (hypertension)   Hyperlipemia   Sleep apnea   Systolic CHF ([place])   Acute respiratory failure (HCC)   Septic shock (HCC)   Acute encephalopathy   Acute kidney injury ([place])   Acute systolic heart failure (HCC)   SVT (supraventricular tachycardia) ([place])   Debility   Blood in stool   Acute blood loss anemia   Abnormal CT scan, colon   Proctosigmoiditis   Lower GI bleed   Noninfectious gastroenteritis, unspecified   Bacteremia   Ischemic colitis ([place])   9M with history of CKD stage III, COPD, HTN, gout, DM admitted after presenting for an echocardiogram ordered by his nephrologist secondary to elevated heart rate and leg edema. He then was referred to the ER after call placed to Dr. Lowanda Foster reporting patient heart rate in the 120s and associated dizziness. He was found to be in newly recognized atrial flutter RVR of unknown duration, acute cardiogenic/septic shock requiring pressors & IV abx (citrobacter bacteremia), acute respiratory failure requiring ventilation, EF 25%. Ischemic workup not yet pursued due to AKI on CKD with Cr peak over 5. Elevated flat troponin 6/12-6/14. Atrial flutter RVR persisted despite amiodarone loading thus underwent TEE/DCCV 08/22/15 with successful conversion to NSR. On (08/23/15) was noted to go into rapid SVT HR 170-180s with associated chest discomfort and fall in BP. Responded once to carotid massage and a second time to IV adenosine.   1. Acute cardiogenic/septic shock and acute respiratory failure - off pressors, off vent.  BP today is 132/84  2. Acute systolic CHF -  Wt today increased to 261. On oral hydralazine/ nitrates/ BB.  Repeat Echo in 2 months to reassess EF.    3. Atrial flutter RVR - s/p TEE/DCCV 08/22/15. Back in atrial flutter since 6/24, ventricular rate 150-160, now down to 130 BPM on iv amiodarone and oral metoprolol. Cardizem CD 120 mg po added on 6/25 changed to 60 mg every 6 hrs on 6/27 and metoprolol increased to 100 mg bid.  On heparin;  INR today is 1.43.   He has been back on iv amiodarone for the past 4 days; will change back to oral 400 mg bid today. If he doesn't convert on his own, consider another DCCV once he is loaded with amiodarone ? 6/30.  4. AKI on CKD stage III -  Initial Cr 2.40 on 6/10; Peak Cr 5.32 on 6/14. Improved to Cr 1.78 > 1.94 >1.9 today.  GFR 42.  5. Progressive anemia - H/H improved to 11.9/36.2  but 10.7/32.7 yesterday and further dropped to 9.6/29.7 today.  6. Hypokalemia- 3.4 today; will give additional 40 meq; check Mg.  7. HTN-  Now stable; metoprolol and cardizem to be increased yesterday.   Troy Sine, MD, [facility] 08/30/2015, 10:44 AM

## 2015-08-30 NOTE — Progress Notes (Signed)
Inpatient Rehabilitation  Continuing to follow for medical readiness and therapy participation prior to opening insurance case.  Please call with questions.   Carmelia Roller., CCC/SLP Admission Coordinator  [place]  Cell [phone]

## 2015-08-30 NOTE — Progress Notes (Signed)
ANTICOAGULATION CONSULT NOTE - Follow Up Consult  Pharmacy Consult for heparin Indication: atrial fibrillation  No Known Allergies  Patient Measurements: Height: 5\' 6"  (167.6 cm) Weight: 261 lb (118.389 kg) IBW/kg (Calculated) : 63.8 Heparin Dosing Weight: 94 kg  Vital Signs: Temp: 98.3 F (36.8 C) (06/28 0400) Temp Source: Oral (06/28 0400) BP: 140/98 mmHg (06/28 0509) Pulse Rate: 125 (06/28 0400)  Labs:  Recent Labs  08/28/15 1141 08/28/15 1251  08/29/15 0600 08/29/15 0605 08/29/15 1758 08/30/15 0230 08/30/15 0300  HGB  --  11.9*  --  10.7*  --   --   --  9.6*  HCT  --  36.2*  --  32.7*  --   --   --  29.7*  PLT  --  296  --  308  --   --   --  274  LABPROT  --  17.3*  --  17.6*  --   --   --   --   INR  --  1.40  --  1.43  --   --   --   --   HEPARINUNFRC  --   --   < >  --  <0.10* <0.10* <0.10*  --   CREATININE 1.78*  --   --  1.94*  --   --   --  1.90*  < > = values in this interval not displayed.  Estimated Creatinine Clearance: 48.2 mL/min (by C-G formula based on Cr of 1.9).   Assessment: 65 yo male with afib remains with undetectable heparin level. No issues with line or bleeding reported per RN. Hgb down, plt ok.   Goal of Therapy:  Heparin level 0.3-0.5 units/ml Monitor platelets by anticoagulation protocol: Yes   Plan:  - Increase heparin to 1700 units/hr - 6 hr heparin level   Sherlon Handing, PharmD, BCPS Clinical pharmacist, pager [phone] 08/30/2015,5:55 AM

## 2015-08-31 DIAGNOSIS — G9341 Metabolic encephalopathy: Secondary | ICD-10-CM

## 2015-08-31 DIAGNOSIS — J189 Pneumonia, unspecified organism: Secondary | ICD-10-CM

## 2015-08-31 LAB — GLUCOSE, CAPILLARY
Glucose-Capillary: 108 mg/dL — ABNORMAL HIGH (ref 65–99)
Glucose-Capillary: 189 mg/dL — ABNORMAL HIGH (ref 65–99)
Glucose-Capillary: 85 mg/dL (ref 65–99)
Glucose-Capillary: 85 mg/dL (ref 65–99)

## 2015-08-31 LAB — BASIC METABOLIC PANEL
Anion gap: 6 (ref 5–15)
BUN: 23 mg/dL — ABNORMAL HIGH (ref 6–20)
CO2: 25 mmol/L (ref 22–32)
Calcium: 8.3 mg/dL — ABNORMAL LOW (ref 8.9–10.3)
Chloride: 103 mmol/L (ref 101–111)
Creatinine, Ser: 1.82 mg/dL — ABNORMAL HIGH (ref 0.61–1.24)
GFR calc Af Amer: 44 mL/min — ABNORMAL LOW (ref >60)
GFR calc non Af Amer: 38 mL/min — ABNORMAL LOW (ref >60)
Glucose, Bld: 81 mg/dL (ref 65–99)
Potassium: 4.2 mmol/L (ref 3.5–5.1)
Sodium: 134 mmol/L — ABNORMAL LOW (ref 135–145)

## 2015-08-31 LAB — PROTIME-INR
INR: 1.5 — ABNORMAL HIGH (ref 0.00–1.49)
Prothrombin Time: 18.2 seconds — ABNORMAL HIGH (ref 11.6–15.2)

## 2015-08-31 LAB — MAGNESIUM: Magnesium: 1.9 mg/dL (ref 1.7–2.4)

## 2015-08-31 LAB — HEPARIN LEVEL (UNFRACTIONATED)
Heparin Unfractionated: 0.43 IU/mL (ref 0.30–0.70)
Heparin Unfractionated: 0.46 IU/mL (ref 0.30–0.70)

## 2015-08-31 LAB — CBC
HCT: 29.3 % — ABNORMAL LOW (ref 39.0–52.0)
Hemoglobin: 9.4 g/dL — ABNORMAL LOW (ref 13.0–17.0)
MCH: 27.3 pg (ref 26.0–34.0)
MCHC: 32.1 g/dL (ref 30.0–36.0)
MCV: 85.2 fL (ref 78.0–100.0)
Platelets: 306 10*3/uL (ref 150–400)
RBC: 3.44 MIL/uL — ABNORMAL LOW (ref 4.22–5.81)
RDW: 17.6 % — ABNORMAL HIGH (ref 11.5–15.5)
WBC: 4.8 10*3/uL (ref 4.0–10.5)

## 2015-08-31 MED ORDER — METOPROLOL TARTRATE 100 MG PO TABS
100.0000 mg | ORAL_TABLET | Freq: Two times a day (BID) | ORAL | Status: DC
  Administered 2015-08-31 – 2015-09-08 (×16): 100 mg via ORAL
  Filled 2015-08-31 (×3): qty 1
  Filled 2015-08-31: qty 2
  Filled 2015-08-31: qty 1
  Filled 2015-08-31: qty 2
  Filled 2015-08-31 (×2): qty 1
  Filled 2015-08-31 (×6): qty 2
  Filled 2015-08-31 (×3): qty 1
  Filled 2015-08-31: qty 2

## 2015-08-31 MED ORDER — SODIUM CHLORIDE 0.9% FLUSH
3.0000 mL | INTRAVENOUS | Status: DC | PRN
  Administered 2015-09-03: 3 mL via INTRAVENOUS
  Filled 2015-08-31: qty 3

## 2015-08-31 MED ORDER — SODIUM CHLORIDE 0.9% FLUSH
3.0000 mL | Freq: Two times a day (BID) | INTRAVENOUS | Status: DC
  Administered 2015-08-31 – 2015-09-03 (×6): 3 mL via INTRAVENOUS

## 2015-08-31 MED ORDER — WARFARIN SODIUM 2.5 MG PO TABS
2.5000 mg | ORAL_TABLET | Freq: Once | ORAL | Status: AC
  Administered 2015-08-31: 2.5 mg via ORAL
  Filled 2015-08-31: qty 1

## 2015-08-31 MED ORDER — SODIUM CHLORIDE 0.9 % IV SOLN
250.0000 mL | INTRAVENOUS | Status: DC
  Administered 2015-08-31: 250 mL via INTRAVENOUS

## 2015-08-31 NOTE — Progress Notes (Signed)
ANTICOAGULATION CONSULT NOTE - Follow Up Consult  Pharmacy Consult for heparin/Coumadin Indication: atrial fibrillation  No Known Allergies  Patient Measurements: Height: 5\' 6"  (167.6 cm) Weight: 258 lb (117.028 kg) IBW/kg (Calculated) : 63.8 Heparin Dosing Weight: 94kg  Vital Signs: Temp: 98.2 F (36.8 C) (06/29 0329) Temp Source: Oral (06/29 0329) BP: 136/92 mmHg (06/29 0600) Pulse Rate: 70 (06/29 0620)  Labs:  Recent Labs  08/29/15 0600  08/30/15 0300 08/30/15 1410 08/31/15 0150 08/31/15 0245  HGB 10.7*  --  9.6*  --   --  9.4*  HCT 32.7*  --  29.7*  --   --  29.3*  PLT 308  --  274  --   --  306  LABPROT 17.6*  --  17.4*  --   --  18.2*  INR 1.43  --  1.42  --   --  1.50*  HEPARINUNFRC  --   < >  --  0.28* 0.43 0.46  CREATININE 1.94*  --  1.90*  --   --  1.82*  < > = values in this interval not displayed.  Estimated Creatinine Clearance: 50 mL/min (by C-G formula based on Cr of 1.82).   Assessment: 26 YOM admitted 08/14/2015 with newly diagnosed AFlutter with RVR (s/p DCCV on 6/20). Pharmacy consulted to dose coumadin. INR up to 6.36 and Vitamin K 5mg  IV given x 1 (6/24).He is noted with recent a lower GI bleed (flex sigmoidoscopy 6/23 w/ biopsy)on heparin and coumadin (spoke with Dr. Waldron [facility]). Will keep on the lower end of goal for heparin and coumadin. Heparin level therapeutic on 1800 units/hr. No bleeding noted.  INR rising slowly, dosing cautiously given quick INR jump (>6) after 2 doses last week.  Goal of Therapy:  Heparin level 0.3-0.5 units/ml  INR goal 2-2.5 Monitor platelets by anticoagulation protocol: Yes   Plan:  Continue heparin at 1800 units/hr Daily CBC and heparin level Coumadin 2.5 mg po x 1 Daily INR.  Uvaldo Rising, BCPS  Clinical Pharmacist Pager [phone]  08/31/2015 7:58 AM

## 2015-08-31 NOTE — Progress Notes (Signed)
Inpatient Rehabilitation  Continuing to follow along for timing of medical readiness, therapy tolerance, and insurance authorization.  Note that cardiac work-up is ongoing at this time.  PT continues to recommend IP Rehab post acute care stay.  Patient will also require an OT evaluation for insurance authorization.  MD please order when appropriate.  Please call with questions.    Carmelia Roller., CCC/SLP Admission Coordinator  [place]  Cell [phone]

## 2015-08-31 NOTE — Progress Notes (Signed)
Physical Therapy Wound Treatment Patient Details  Name: Frank Hoover MRN: [id number] Date of Birth: [birth year]  Today's Date: 08/31/2015 Time: 0800-0848 Time Calculation (min): 48 min  Subjective  Subjective: Pt pleasant and agreeable to therapy Patient and Family Stated Goals: Did not state Date of Onset:  (Unknown)  Pain Score:  Pt reports 9/10 pain at end of session. RN notified for pain meds.   Wound Assessment  Pressure Ulcer 08/29/15 Unstageable - Full thickness tissue loss in which the base of the ulcer is covered by slough (yellow, tan, gray, green or brown) and/or eschar (tan, brown or black) in the wound bed. 100% yellow-per staff started as blister (Active)  Dressing Type ABD;Barrier Film (skin prep);Gauze (Comment);Moist to dry 08/31/2015  8:49 AM  Dressing Changed;Clean;Dry;Intact 08/31/2015  8:49 AM  Dressing Change Frequency Daily 08/31/2015  8:49 AM  State of Healing Eschar 08/31/2015  8:49 AM  Site / Wound Assessment Yellow;Pink 08/31/2015  8:49 AM  % Wound base Red or Granulating 15% 08/31/2015  8:49 AM  % Wound base Yellow 85% 08/31/2015  8:49 AM  % Wound base Black 0% 08/31/2015  8:49 AM  Peri-wound Assessment Intact 08/31/2015  8:49 AM  Wound Length (cm) 9.5 cm 08/29/2015  2:00 PM  Wound Width (cm) 5.8 cm 08/29/2015  2:00 PM  Wound Depth (cm) 0.1 cm 08/29/2015  2:00 PM  Drainage Amount Minimal 08/31/2015  8:49 AM  Drainage Description Purulent 08/31/2015  8:49 AM  Treatment Debridement (Selective);Hydrotherapy (Pulse lavage);Packing (Saline gauze) 08/31/2015  8:49 AM  Santyl applied to wound bed prior to applying dressing.   Hydrotherapy Pulsed lavage therapy - wound location: Gluteal cleft Pulsed Lavage with Suction (psi): 12 psi Pulsed Lavage with Suction - Normal Saline Used: 1000 mL Pulsed Lavage Tip: Tip with splash shield Selective Debridement Selective Debridement - Location: Gluteal cleft Selective Debridement - Tools Used: Scalpel;Forceps Selective  Debridement - Tissue Removed: yellow necrotic tissue   Wound Assessment and Plan  Wound Therapy - Assess/Plan/Recommendations Wound Therapy - Clinical Statement: This patient will benefit from continued hydrotherapy for selective removal of necrotic tissue and to promote wound healing.  Wound Therapy - Functional Problem List: Decreased tolerance of OOB, acute pain.  Factors Delaying/Impairing Wound Healing: Altered sensation;Diabetes Mellitus;Immobility;Multiple medical problems Hydrotherapy Plan: Debridement;Dressing change;Patient/family education;Pulsatile lavage with suction Wound Therapy - Frequency: 6X / week Wound Therapy - Follow Up Recommendations: Other (comment) (CIR) Wound Plan: See above  Wound Therapy Goals- Improve the function of patient's integumentary system by progressing the wound(s) through the phases of wound healing (inflammation - proliferation - remodeling) by: Decrease Necrotic Tissue to: 25% Decrease Necrotic Tissue - Progress: Progressing toward goal Increase Granulation Tissue to: 75% Increase Granulation Tissue - Progress: Progressing toward goal Goals/treatment plan/discharge plan were made with and agreed upon by patient/family: Yes Time For Goal Achievement: 7 days Wound Therapy - Potential for Goals: Good  Goals will be updated until maximal potential achieved or discharge criteria met.  Discharge criteria: when goals achieved, discharge from hospital, MD decision/surgical intervention, no progress towards goals, refusal/missing three consecutive treatments without notification or medical reason.  GP     Rolinda Roan 08/31/2015, 8:53 AM   Rolinda Roan, PT, DPT Acute Rehabilitation Services Pager: [phone]

## 2015-08-31 NOTE — Progress Notes (Signed)
ANTICOAGULATION CONSULT NOTE - Follow Up Consult  Pharmacy Consult for heparin  Indication: atrial fibrillation  No Known Allergies  Patient Measurements: Height: 5\' 6"  (167.6 cm) Weight: 261 lb (118.389 kg) IBW/kg (Calculated) : 63.8 Heparin Dosing Weight: 94kg  Vital Signs: Temp: 98.2 F (36.8 C) (06/28 2331) Temp Source: Oral (06/28 2331) BP: 127/76 mmHg (06/28 2331) Pulse Rate: 34 (06/28 2331)  Labs:  Recent Labs  08/28/15 1141  08/28/15 1251  08/29/15 0600  08/30/15 0230 08/30/15 0300 08/30/15 1410 08/31/15 0150  HGB  --   < > 11.9*  --  10.7*  --   --  9.6*  --   --   HCT  --   --  36.2*  --  32.7*  --   --  29.7*  --   --   PLT  --   --  296  --  308  --   --  274  --   --   LABPROT  --   --  17.3*  --  17.6*  --   --  17.4*  --   --   INR  --   --  1.40  --  1.43  --   --  1.42  --   --   HEPARINUNFRC  --   --   --   < >  --   < > <0.10*  --  0.28* 0.43  CREATININE 1.78*  --   --   --  1.94*  --   --  1.90*  --   --   < > = values in this interval not displayed.  Estimated Creatinine Clearance: 48.2 mL/min (by C-G formula based on Cr of 1.9).   Assessment: 18 YOM admitted 08/14/2015 with newly diagnosed AFlutter with RVR (s/p DCCV on 6/20). Pharmacy consulted to dose coumadin. INR up to 6.36 and Vitamin K 5mg  IV given x 1 (6/24).He is noted with recent a lower GI bleed (flex sigmoidoscopy 6/23 w/ biopsy)on heparin and coumadin (spoke with Dr. Waldron Labs). Will keep on the lower end of goal for heparin and coumadin. Heparin level therapeutic on 1800 units/hr. No bleeding noted.  Goal of Therapy:  Heparin level 0.3-0.5 units/ml  INR goal 2-2.5 Monitor platelets by anticoagulation protocol: Yes   Plan:  Continue heparin at 1800 units/hr Daily CBC and heparin level  Sherlon Handing, PharmD, BCPS Clinical pharmacist, pager [phone]  08/31/2015 2:55 AM

## 2015-08-31 NOTE — Progress Notes (Signed)
Pt HR is sustaining at 100-120's for 10 mins. Metoprolol 5mg  PRN was given, HR went back to controlled 80-90's. Will continue to monitor.

## 2015-08-31 NOTE — Progress Notes (Signed)
Subjective:  No chest pain; breathing better  Objective:   Vital Signs : Filed Vitals:   08/31/15 0600 08/31/15 0620 08/31/15 0924 08/31/15 1000  BP: 136/92  159/91 138/93  Pulse: 124 70 71 124  Temp:   98.1 F (36.7 C)   TempSrc:   Oral   Resp: _0 Height:      Weight:      SpO2: 99% 99% 100% 100%    Intake/Output from previous day:  Intake/Output Summary (Last 24 hours) at 08/31/15 1102 Last data filed at 08/31/15 1001  Gross per 24 hour  Intake 1740.71 ml  Output   1550 ml  Net 190.71 ml    I/O since admission: -7587  Wt Readings from Last 3 Encounters:  08/31/15 258 lb (117.028 kg)  06/16/15 262 lb (118.842 kg)  06/22/14 265 lb (120.203 kg)    Medications: . acyclovir ointment   Topical 6 X Daily  . amiodarone  400 mg Oral BID  . collagenase   Topical Daily  . diltiazem  60 mg Oral Q6H  . feeding supplement (ENSURE ENLIVE)  237 mL Oral TID BM  . feeding supplement (PRO-STAT SUGAR FREE 64)  30 mL Oral TID BM  . furosemide  40 mg Oral Daily  . hydrALAZINE  75 mg Oral Q8H  . insulin aspart  0-15 Units Subcutaneous TID WC  . insulin glargine  34 Units Subcutaneous Daily  . isosorbide mononitrate  60 mg Oral Daily  . metoprolol tartrate  100 mg Oral BID  . multivitamin with minerals  1 tablet Oral Daily  . oxybutynin  5 mg Oral TID  . pantoprazole  40 mg Oral Daily  . prochlorperazine  5 mg Intravenous Once  . warfarin  2.5 mg Oral ONCE-1800  . Warfarin - Pharmacist Dosing Inpatient   Does not apply q1800    . sodium chloride Stopped (08/21/15 1617)  . heparin 1,800 Units/hr (08/30/15 2135)    Physical Exam:   General appearance: no distress Neck: no adenopathy, supple, symmetrical, trachea midline, thyroid not enlarged, symmetric, no tenderness/mass/nodules and JVD 8 cm Lungs: clear to auscultation bilaterally Heart: regular, tachycardic at 270; 1/6 systolic murmur; no rub Abdomen: soft, non-tender; bowel sounds normal; no masses,  no  organomegaly Extremities: no edema, redness or tenderness in the calves or thighs Pulses: 2+ and symmetric Skin: Skin color, texture, turgor normal. No rashes or lesions Neurologic: Grossly normal   Rate: 117  Rhythm: atrial flutter   08/31/15 ECG (independently read by me): A flutter with variable block at 98  6/28 ECG (independently read by me): A Flutter 2:1 rate 130  08/26/15 ECG (independently read by me): Accelerated junctional at 120; Q's 3, aVF. Increased QTc.  Lab Results:   Recent Labs  08/28/15 1141 08/29/15 0600 08/30/15 0300 08/31/15 0245  NA 137 138 134* 134*  K 3.8 3.6 3.4* 4.2  CL 106 103 104 103  CO2 _1 GLUCOSE 202* 193* 170* 81  BUN 18 18 22* 23*  CREATININE 1.78* 1.94* 1.90* 1.82*  CALCIUM 8.4* 8.4* 7.9* 8.3*  MG 1.6*  --  1.6* 1.9  PHOS 2.5  --   --   --     Hepatic Function Latest Ref Rng 08/30/2015 08/25/2015 08/18/2015  Total Protein 6.5 - 8.1 g/dL 4.8(L) 5.4(L) -  Albumin 3.5 - 5.0 g/dL 1.9(L) 1.8(L) 1.5(L)  AST 15 - 41 U/L 21 38 -  ALT 17 - 63 U/L  24 41 -  Alk Phosphatase 38 - 126 U/L 45 57 -  Total Bilirubin 0.3 - 1.2 mg/dL 0.4 0.1(L) -  Bilirubin, Direct 0.1 - 0.5 mg/dL 0.1 0.1 -     Recent Labs  08/29/15 0600 08/30/15 0300 08/31/15 0245  WBC 6.3 5.4 4.8  HGB 10.7* 9.6* 9.4*  HCT 32.7* 29.7* 29.3*  MCV 84.3 83.0 85.2  PLT 308 274 306    No results for input(s): TROPONINI in the last 72 hours.  Invalid input(s): CK, MB  Lab Results  Component Value Date   TSH 1.917 08/14/2015   No results for input(s): HGBA1C in the last 72 hours.   Recent Labs  08/30/15 0300  PROT 4.8*  ALBUMIN 1.9*  AST 21  ALT 24  ALKPHOS 45  BILITOT 0.4  BILIDIR 0.1  IBILI 0.3    Recent Labs  08/31/15 0245  INR 1.50*   BNP (last 3 results)  Recent Labs  08/14/15 1501  BNP 609.0*    ProBNP (last 3 results) No results for input(s): PROBNP in the last 8760 hours.   Lipid Panel     Component Value Date/Time   CHOL 79  08/15/2015 0422   TRIG 38 08/15/2015 0422   HDL 34* 08/15/2015 0422   CHOLHDL 2.3 08/15/2015 0422   VLDL 8 08/15/2015 0422   LDLCALC 37 08/15/2015 0422      Imaging:  No results found.    Assessment/Plan:   Principal Problem:   Atrial flutter with rapid ventricular response (HCC) Active Problems:   GERD   DM (diabetes mellitus) (HCC)   HTN (hypertension)   Hyperlipemia   Sleep apnea   Systolic CHF ([place])   Acute respiratory failure (HCC)   Septic shock (HCC)   Acute encephalopathy   Acute kidney injury ([place])   Acute systolic heart failure (HCC)   SVT (supraventricular tachycardia) ([place])   Debility   Blood in stool   Acute blood loss anemia   Abnormal CT scan, colon   Proctosigmoiditis   Lower GI bleed   Noninfectious gastroenteritis, unspecified   Bacteremia   Ischemic colitis ([place])   70M with history of CKD stage III, COPD, HTN, gout, DM admitted after presenting for an echocardiogram ordered by his nephrologist secondary to elevated heart rate and leg edema. He then was referred to the ER after call placed to Dr. Lowanda Foster reporting patient heart rate in the 120s and associated dizziness. He was found to be in newly recognized atrial flutter RVR of unknown duration, acute cardiogenic/septic shock requiring pressors & IV abx (citrobacter bacteremia), acute respiratory failure requiring ventilation, EF 25%. Ischemic workup not yet pursued due to AKI on CKD with Cr peak over 5. Elevated flat troponin 6/12-6/14. Atrial flutter RVR persisted despite amiodarone loading thus underwent TEE/DCCV 08/22/15 with successful conversion to NSR. On (08/23/15) was noted to go into rapid SVT HR 170-180s with associated chest discomfort and fall in BP. Responded once to carotid massage and a second time to IV adenosine.   1. Recent acute cardiogenic/septic shock and acute respiratory failure - off pressors, off vent; now stable    2. Acute systolic CHF -  Wt today 035 today, decease 3 lbs  from increased weight yesterday. On oral hydralazine/ nitrates/ BB.  Repeat Echo in 2 months to reassess EF.   3. Atrial flutter RVR - s/p TEE/DCCV 08/22/15. Back in atrial flutter since 6/24, now on amiodarone 400 mg bid, cardizem 60 mg q 6 hrs and metoprolol 100 mg  bid.   4. AKI on CKD stage III -  Initial Cr 2.40 on 6/10; Peak Cr 5.32 on 6/14. Improved to Cr 1.78 > 1.94 >1.9 1.82 today.  GFR 44.  5. Progressive anemia - H/H improved to 11.9/36.2  but 10.7/32.7 > 9.6/29.7 > 9.4/29.3.  6. Hypokalemia- improved to 4.2 today; Mg 1.9  7. HTN-  Now stable; on meds  Pt continues to be in a flutter; HR improving.  On heparin.  Will keep NPO in am tomorrow with plan for TEE/DC Cardioversion if can get scheduled.   Troy Sine, MD, [facility] 08/31/2015, 11:02 AM

## 2015-08-31 NOTE — Progress Notes (Signed)
PROGRESS NOTE    Frank Hoover  YIF:[id number] DOB: 17-Apr-1950 DOA: 08/14/2015 PCP: Alonza Bogus, MD   Brief Narrative:  65 year old BM PMHx COPD, DM Type 2 uncontrolled with complication, HTN, HLD, CKD stage III, gout, Adenomatous colon polyp,Diverticulosis of colon  Originally seen by his Nephrologist in [place], found to be in A. fib with RVR , admitted to [facility] on 08/15/15. 2-D echo showed EF 25%, and he developed septic and cardiogenic shock, w/ temp 104. He was transferred to [facility] and treated for bacteremia due to UTI, requiring pressor support. He required intubation, and was successfully extubated on 6/19. He was transferred to [facility] on 6/20.   Patient was seen by Cardiology and EP for atrial flutter and SVT. He converted to NSR after TEE DCCV on 6/20, and unfortunately went back into A. fib with RVR. While on anticoagulation for atrial fibrillation, he developed bright red blood per rectum. Flexible sigmoidoscopy was significant for ischemic colitis.   Assessment & Plan:   Principal Problem:   Atrial flutter with rapid ventricular response (HCC) Active Problems:   GERD   DM (diabetes mellitus) ([place])   HTN (hypertension)   Hyperlipemia   Sleep apnea   Systolic CHF ([place])   Acute respiratory failure (HCC)   Septic shock (HCC)   Acute encephalopathy   Acute kidney injury ([place])   Acute systolic heart failure (HCC)   SVT (supraventricular tachycardia) ([place])   Debility   Blood in stool   Acute blood loss anemia   Abnormal CT scan, colon   Proctosigmoiditis   Lower GI bleed   Noninfectious gastroenteritis, unspecified   Bacteremia   Ischemic colitis ([place])   Acute hypoxic resp failure - With history of OSA, COPD, and cardiogenic/septic shock - CPAP QHS for sleep apnea  Septic shock / Citrobacter bacteremia in the setting of UTI - Septic shock resolved - Blood cultures and urine culture growing Citrobacter - to complete total of 2 weeks  treatment on 6/28 - repeat blood cultures 6/22: One bottle out of 2 sets growing micrococcus species, most likely contamination  Lower GI bleed secondary to ischemic colitis - Secondary to ischemic colitis as confirmed by biopsy ,status post flexible sigmoidoscopy on 6/23 -6/25 transfused 2 units PRBC  - continue with heparin GTT > resumed warfarin as discussed with GI Recent Labs Lab 08/27/15 1827 08/28/15 1251 08/29/15 0600 08/30/15 0300 08/31/15 0245  HGB 11.0* 11.9* 10.7* 9.6* 9.4*   HCAP - has completed tx   Cardiogenic shock / acute systolic CHF with EF 86% - management per Cardiology - Diuresis per cardiology - weight trending up - not on ACEI/ARB/spironolactone, aldactone due to ARF  Atrial fib with RVR  - Management per cardiology - status post s/p TEE/DCCV 08/22/15, converted to NSR, but unfortunately currently back in A. fib with RVR, management per cardiology - currently on amiodarone drip - continue with heparin GTT > warfarin  Episode of SVT on 08/23/18 - required carotid massage, IV adenosine, EP consult appreciated  HTN - Blood pressure acceptable  AKI on CKD Stage 3 - Continue to monitor - improving despite diuresis - Cr peaked at 5.32 Lab Results  Component Value Date   CREATININE 1.82* 08/31/2015   CREATININE 1.90* 08/30/2015   CREATININE 1.94* 08/29/2015  -Significantly improved  Anemia  - due to chronic kidney disease, chronic illness, and GI bleed - slowly drifting down - follow trend  Recent Labs Lab 08/27/15 1827 08/28/15 1251 08/29/15 0600 08/30/15 0300 08/31/15 0245  HGB 11.0* 11.9* 10.7* 9.6* 9.4*   DM type II uncontrolled with complication -7/91 hemoglobin A1c= 7.8 - CBG poorly controlled - adjust tx and follow trend   Urinary retention -foley per urology, to remain for several weeks to allow urethra to heal per urology rec  Acute metabolic encephalopathy - much improved today   Unstageable Pressure Injury gluteal cleft  -  Wound care following     DVT prophylaxis: Heparin drip Code Status: Full Family Communication: None Disposition Plan: Per cardiology   Consultants:  Cards PCCM Urology Inpatient rehabilitation   Procedures/Significant Events:  6/13 R Femoral CVL > 6/13 6/13 ETT > 6/18 6/13 Lt radial aline > 6/17 6/13 RIJ CVL > 6/19 6/25 2 units PRBC transfusion  Cultures   Antimicrobials:    Devices    LINES / TUBES:      Continuous Infusions: . sodium chloride Stopped (08/21/15 1617)  . heparin 1,800 Units/hr (08/30/15 2135)     Subjective: 6/29  A/O 4, NAD,  Objective: Filed Vitals:   08/31/15 0400 08/31/15 0512 08/31/15 0600 08/31/15 0620  BP: 131/64  136/92   Pulse: 54 81 124 70  Temp:      TempSrc:      Resp: 14 16 16 18   Height:      Weight:      SpO2: 100% 100% 99% 99%    Intake/Output Summary (Last 24 hours) at 08/31/15 0900 Last data filed at 08/31/15 0400  Gross per 24 hour  Intake 1211.11 ml  Output   1550 ml  Net -338.89 ml   Filed Weights   08/29/15 0400 08/30/15 0400 08/31/15 0329  Weight: 115.5 kg (254 lb 10.1 oz) 118.389 kg (261 lb) 117.028 kg (258 lb)    Examination:  General: A/O 4, NAD, No acute respiratory distress Eyes: negative scleral hemorrhage, negative anisocoria, negative icterus ENT: Negative Runny nose, negative gingival bleeding, Neck:  Negative scars, masses, torticollis, lymphadenopathy, JVD Lungs: Clear to auscultation bilaterally without wheezes or crackles Cardiovascular: Regular rate and rhythm without murmur gallop or rub normal S1 and S2 Abdomen: negative abdominal pain, nondistended, positive soft, bowel sounds, no rebound, no ascites, no appreciable mass Extremities: No significant cyanosis, clubbing, or edema bilateral lower extremities Skin: Negative rashes, lesions, ulcers Psychiatric:  Negative depression, negative anxiety, negative fatigue, negative mania  Central nervous system:  Cranial nerves II  through XII intact, tongue/uvula midline, all extremities muscle strength 5/5, sensation intact throughout, negative dysarthria, negative expressive aphasia, negative receptive aphasia.  .     Data Reviewed: Care during the described time interval was provided by me .  I have reviewed this patient's available data, including medical history, events of note, physical examination, and all test results as part of my evaluation. I have personally reviewed and interpreted all radiology studies.  CBC:  Recent Labs Lab 08/27/15 1827 08/28/15 1251 08/29/15 0600 08/30/15 0300 08/31/15 0245  WBC 7.0 6.9 6.3 5.4 4.8  HGB 11.0* 11.9* 10.7* 9.6* 9.4*  HCT 33.6* 36.2* 32.7* 29.7* 29.3*  MCV 84.0 84.6 84.3 83.0 85.2  PLT 319 296 308 274 505   Basic Metabolic Panel:  Recent Labs Lab 08/27/15 0326 08/28/15 1141 08/29/15 0600 08/30/15 0300 08/31/15 0245  NA 135 137 138 134* 134*  K 3.2* 3.8 3.6 3.4* 4.2  CL 104 106 103 104 103  CO2 24 23 26 24 25   GLUCOSE 154* 202* 193* 170* 81  BUN 23* 18 18 22* 23*  CREATININE 1.91* 1.78* 1.94* 1.90* 1.82*  CALCIUM 8.0* 8.4* 8.4* 7.9* 8.3*  MG  --  1.6*  --  1.6* 1.9  PHOS  --  2.5  --   --   --    GFR: Estimated Creatinine Clearance: 50 mL/min (by C-G formula based on Cr of 1.82). Liver Function Tests:  Recent Labs Lab 08/25/15 0423 08/30/15 0300  AST 38 21  ALT 41 24  ALKPHOS 57 45  BILITOT 0.1* 0.4  PROT 5.4* 4.8*  ALBUMIN 1.8* 1.9*   No results for input(s): LIPASE, AMYLASE in the last 168 hours. No results for input(s): AMMONIA in the last 168 hours. Coagulation Profile:  Recent Labs Lab 08/27/15 0326 08/28/15 1251 08/29/15 0600 08/30/15 0300 08/31/15 0245  INR 1.34 1.40 1.43 1.42 1.50*   Cardiac Enzymes: No results for input(s): CKTOTAL, CKMB, CKMBINDEX, TROPONINI in the last 168 hours. BNP (last 3 results) No results for input(s): PROBNP in the last 8760 hours. HbA1C: No results for input(s): HGBA1C in the last 72  hours. CBG:  Recent Labs Lab 08/29/15 2131 08/30/15 0838 08/30/15 1215 08/30/15 1659 08/30/15 2111  GLUCAP 170* 222* 230* 131* 110*   Lipid Profile: No results for input(s): CHOL, HDL, LDLCALC, TRIG, CHOLHDL, LDLDIRECT in the last 72 hours. Thyroid Function Tests: No results for input(s): TSH, T4TOTAL, FREET4, T3FREE, THYROIDAB in the last 72 hours. Anemia Panel: No results for input(s): VITAMINB12, FOLATE, FERRITIN, TIBC, IRON, RETICCTPCT in the last 72 hours. Urine analysis:    Component Value Date/Time   COLORURINE AMBER* 08/14/2015 2125   APPEARANCEUR HAZY* 08/14/2015 2125   LABSPEC 1.015 08/14/2015 2125   PHURINE 5.0 08/14/2015 2125   GLUCOSEU 100* 08/14/2015 2125   HGBUR LARGE* 08/14/2015 2125   BILIRUBINUR NEGATIVE 08/14/2015 2125   KETONESUR NEGATIVE 08/14/2015 2125   PROTEINUR 100* 08/14/2015 2125   UROBILINOGEN 0.2 12/02/2013 1739   NITRITE NEGATIVE 08/14/2015 2125   LEUKOCYTESUR MODERATE* 08/14/2015 2125   Sepsis Labs: @LABRCNTIP (procalcitonin:4,lacticidven:4)  ) Recent Results (from the past 240 hour(s))  C difficile quick scan w PCR reflex     Status: None   Collection Time: 08/23/15  4:14 PM  Result Value Ref Range Status   C Diff antigen NEGATIVE NEGATIVE Final   C Diff toxin NEGATIVE NEGATIVE Final   C Diff interpretation Negative for toxigenic C. difficile  Final  Gastrointestinal Panel by PCR , Stool     Status: None   Collection Time: 08/24/15 11:29 AM  Result Value Ref Range Status   Campylobacter species NOT DETECTED NOT DETECTED Final   Plesimonas shigelloides NOT DETECTED NOT DETECTED Final   Salmonella species NOT DETECTED NOT DETECTED Final   Yersinia enterocolitica NOT DETECTED NOT DETECTED Final   Vibrio species NOT DETECTED NOT DETECTED Final   Vibrio cholerae NOT DETECTED NOT DETECTED Final   Enteroaggregative E coli (EAEC) NOT DETECTED NOT DETECTED Final   Enteropathogenic E coli (EPEC) NOT DETECTED NOT DETECTED Final    Enterotoxigenic E coli (ETEC) NOT DETECTED NOT DETECTED Final   Shiga like toxin producing E coli (STEC) NOT DETECTED NOT DETECTED Final   E. coli O157 NOT DETECTED NOT DETECTED Final   Shigella/Enteroinvasive E coli (EIEC) NOT DETECTED NOT DETECTED Final   Cryptosporidium NOT DETECTED NOT DETECTED Final   Cyclospora cayetanensis NOT DETECTED NOT DETECTED Final   Entamoeba histolytica NOT DETECTED NOT DETECTED Final   Giardia lamblia NOT DETECTED NOT DETECTED Final   Adenovirus F40/41 NOT DETECTED NOT DETECTED Final   Astrovirus NOT DETECTED NOT DETECTED Final  Norovirus GI/GII NOT DETECTED NOT DETECTED Final   Rotavirus A NOT DETECTED NOT DETECTED Final   Sapovirus (I, II, IV, and V) NOT DETECTED NOT DETECTED Final  Culture, blood (Routine X 2) w Reflex to ID Panel     Status: None   Collection Time: 08/24/15 12:20 PM  Result Value Ref Range Status   Specimen Description BLOOD LEFT HAND  Final   Special Requests BOTTLES DRAWN AEROBIC ONLY 10CC  Final   Culture NO GROWTH 5 DAYS  Final   Report Status 08/29/2015 FINAL  Final  Culture, blood (Routine X 2) w Reflex to ID Panel     Status: Abnormal   Collection Time: 08/24/15 12:40 PM  Result Value Ref Range Status   Specimen Description BLOOD LEFT HAND  Final   Special Requests IN PEDIATRIC BOTTLE 2.5CC  Final   Culture  Setup Time   Final    GRAM POSITIVE COCCI IN CLUSTERS AEROBIC BOTTLE ONLY CRITICAL RESULT CALLED TO, READ BACK BY AND VERIFIED WITH: JAMES LEDFORD,PHARMD @0113  08/26/15 MKELLY    Culture (A)  Final    MICROCOCCUS SPECIES Standardized susceptibility testing for this organism is not available.    Report Status 08/28/2015 FINAL  Final  Blood Culture ID Panel (Reflexed)     Status: None   Collection Time: 08/24/15 12:40 PM  Result Value Ref Range Status   Enterococcus species NOT DETECTED NOT DETECTED Final   Vancomycin resistance NOT DETECTED NOT DETECTED Final   Listeria monocytogenes NOT DETECTED NOT DETECTED  Final   Staphylococcus species NOT DETECTED NOT DETECTED Final   Staphylococcus aureus NOT DETECTED NOT DETECTED Final   Methicillin resistance NOT DETECTED NOT DETECTED Final   Streptococcus species NOT DETECTED NOT DETECTED Final   Streptococcus agalactiae NOT DETECTED NOT DETECTED Final   Streptococcus pneumoniae NOT DETECTED NOT DETECTED Final   Streptococcus pyogenes NOT DETECTED NOT DETECTED Final   Acinetobacter baumannii NOT DETECTED NOT DETECTED Final   Enterobacteriaceae species NOT DETECTED NOT DETECTED Final   Enterobacter cloacae complex NOT DETECTED NOT DETECTED Final   Escherichia coli NOT DETECTED NOT DETECTED Final   Klebsiella oxytoca NOT DETECTED NOT DETECTED Final   Klebsiella pneumoniae NOT DETECTED NOT DETECTED Final   Proteus species NOT DETECTED NOT DETECTED Final   Serratia marcescens NOT DETECTED NOT DETECTED Final   Carbapenem resistance NOT DETECTED NOT DETECTED Final   Haemophilus influenzae NOT DETECTED NOT DETECTED Final   Neisseria meningitidis NOT DETECTED NOT DETECTED Final   Pseudomonas aeruginosa NOT DETECTED NOT DETECTED Final   Candida albicans NOT DETECTED NOT DETECTED Final   Candida glabrata NOT DETECTED NOT DETECTED Final   Candida krusei NOT DETECTED NOT DETECTED Final   Candida parapsilosis NOT DETECTED NOT DETECTED Final   Candida tropicalis NOT DETECTED NOT DETECTED Final         Radiology Studies: No results found.      Scheduled Meds: . acyclovir ointment   Topical 6 X Daily  . amiodarone  400 mg Oral BID  . collagenase   Topical Daily  . diltiazem  60 mg Oral Q6H  . feeding supplement (ENSURE ENLIVE)  237 mL Oral TID BM  . feeding supplement (PRO-STAT SUGAR FREE 64)  30 mL Oral TID BM  . furosemide  40 mg Oral Daily  . hydrALAZINE  75 mg Oral Q8H  . insulin aspart  0-15 Units Subcutaneous TID WC  . insulin glargine  34 Units Subcutaneous Daily  . isosorbide mononitrate  60 mg  Oral Daily  . metoprolol tartrate  100 mg  Oral BID  . multivitamin with minerals  1 tablet Oral Daily  . oxybutynin  5 mg Oral TID  . pantoprazole  40 mg Oral Daily  . prochlorperazine  5 mg Intravenous Once  . warfarin  2.5 mg Oral ONCE-1800  . Warfarin - Pharmacist Dosing Inpatient   Does not apply q1800   Continuous Infusions: . sodium chloride Stopped (08/21/15 1617)  . heparin 1,800 Units/hr (08/30/15 2135)     LOS: 17 days    Time spent: 40 minutes    WOODS, Geraldo Docker, MD [facility] Pager [phone]   If 7PM-7AM, please contact night-coverage [email or web address] Password [email or web address] 08/31/2015, 9:00 AM

## 2015-09-01 ENCOUNTER — Inpatient Hospital Stay (HOSPITAL_COMMUNITY): Payer: Medicare Other | Admitting: Anesthesiology

## 2015-09-01 ENCOUNTER — Encounter (HOSPITAL_COMMUNITY)
Admission: EM | Disposition: A | Discharge status: Skilled Nursing Facility | Payer: Self-pay | Source: Home / Self Care | Attending: Internal Medicine

## 2015-09-01 ENCOUNTER — Encounter (HOSPITAL_COMMUNITY): Payer: Self-pay | Admitting: *Deleted

## 2015-09-01 DIAGNOSIS — I5021 Acute systolic (congestive) heart failure: Secondary | ICD-10-CM | POA: Diagnosis present

## 2015-09-01 DIAGNOSIS — R140 Abdominal distension (gaseous): Secondary | ICD-10-CM | POA: Diagnosis present

## 2015-09-01 DIAGNOSIS — N1832 Chronic kidney disease, stage 3b: Secondary | ICD-10-CM | POA: Diagnosis present

## 2015-09-01 DIAGNOSIS — E118 Type 2 diabetes mellitus with unspecified complications: Secondary | ICD-10-CM

## 2015-09-01 DIAGNOSIS — I5042 Chronic combined systolic (congestive) and diastolic (congestive) heart failure: Secondary | ICD-10-CM | POA: Diagnosis present

## 2015-09-01 DIAGNOSIS — I4892 Unspecified atrial flutter: Secondary | ICD-10-CM

## 2015-09-01 DIAGNOSIS — R570 Cardiogenic shock: Secondary | ICD-10-CM | POA: Diagnosis present

## 2015-09-01 DIAGNOSIS — J189 Pneumonia, unspecified organism: Secondary | ICD-10-CM | POA: Diagnosis present

## 2015-09-01 DIAGNOSIS — R339 Retention of urine, unspecified: Secondary | ICD-10-CM

## 2015-09-01 DIAGNOSIS — N183 Chronic kidney disease, stage 3 unspecified: Secondary | ICD-10-CM | POA: Diagnosis present

## 2015-09-01 DIAGNOSIS — N390 Urinary tract infection, site not specified: Secondary | ICD-10-CM | POA: Diagnosis present

## 2015-09-01 DIAGNOSIS — IMO0002 Reserved for concepts with insufficient information to code with codable children: Secondary | ICD-10-CM | POA: Diagnosis present

## 2015-09-01 DIAGNOSIS — E1165 Type 2 diabetes mellitus with hyperglycemia: Secondary | ICD-10-CM

## 2015-09-01 DIAGNOSIS — G9341 Metabolic encephalopathy: Secondary | ICD-10-CM | POA: Diagnosis present

## 2015-09-01 DIAGNOSIS — N179 Acute kidney failure, unspecified: Secondary | ICD-10-CM | POA: Diagnosis present

## 2015-09-01 HISTORY — PX: CARDIOVERSION: SHX1299

## 2015-09-01 LAB — GLUCOSE, CAPILLARY
Glucose-Capillary: 123 mg/dL — ABNORMAL HIGH (ref 65–99)
Glucose-Capillary: 130 mg/dL — ABNORMAL HIGH (ref 65–99)
Glucose-Capillary: 194 mg/dL — ABNORMAL HIGH (ref 65–99)
Glucose-Capillary: 103 mg/dL — ABNORMAL HIGH (ref 65–99)

## 2015-09-01 LAB — BASIC METABOLIC PANEL
Anion gap: 5 (ref 5–15)
BUN: 25 mg/dL — ABNORMAL HIGH (ref 6–20)
CO2: 26 mmol/L (ref 22–32)
Calcium: 8.7 mg/dL — ABNORMAL LOW (ref 8.9–10.3)
Chloride: 103 mmol/L (ref 101–111)
Creatinine, Ser: 1.97 mg/dL — ABNORMAL HIGH (ref 0.61–1.24)
GFR calc Af Amer: 40 mL/min — ABNORMAL LOW (ref >60)
GFR calc non Af Amer: 34 mL/min — ABNORMAL LOW (ref >60)
Glucose, Bld: 132 mg/dL — ABNORMAL HIGH (ref 65–99)
Potassium: 4.9 mmol/L (ref 3.5–5.1)
Sodium: 134 mmol/L — ABNORMAL LOW (ref 135–145)

## 2015-09-01 LAB — CBC
HCT: 30.3 % — ABNORMAL LOW (ref 39.0–52.0)
Hemoglobin: 9.6 g/dL — ABNORMAL LOW (ref 13.0–17.0)
MCH: 27 pg (ref 26.0–34.0)
MCHC: 31.7 g/dL (ref 30.0–36.0)
MCV: 85.4 fL (ref 78.0–100.0)
Platelets: 287 10*3/uL (ref 150–400)
RBC: 3.55 MIL/uL — ABNORMAL LOW (ref 4.22–5.81)
RDW: 17.2 % — ABNORMAL HIGH (ref 11.5–15.5)
WBC: 4.4 10*3/uL (ref 4.0–10.5)

## 2015-09-01 LAB — HEMOGLOBIN AND HEMATOCRIT, BLOOD
HCT: 29.3 % — ABNORMAL LOW (ref 39.0–52.0)
Hemoglobin: 9.4 g/dL — ABNORMAL LOW (ref 13.0–17.0)

## 2015-09-01 LAB — PROTIME-INR
INR: 1.56 — ABNORMAL HIGH (ref 0.00–1.49)
Prothrombin Time: 18.7 seconds — ABNORMAL HIGH (ref 11.6–15.2)

## 2015-09-01 LAB — HEPARIN LEVEL (UNFRACTIONATED): Heparin Unfractionated: 0.39 IU/mL (ref 0.30–0.70)

## 2015-09-01 SURGERY — CARDIOVERSION
Anesthesia: General

## 2015-09-01 MED ORDER — WARFARIN SODIUM 5 MG PO TABS
5.0000 mg | ORAL_TABLET | Freq: Once | ORAL | Status: DC
  Filled 2015-09-01: qty 1

## 2015-09-01 MED ORDER — LIDOCAINE 2% (20 MG/ML) 5 ML SYRINGE
INTRAMUSCULAR | Status: DC | PRN
  Administered 2015-09-01: 60 mg via INTRAVENOUS

## 2015-09-01 MED ORDER — PROPOFOL 10 MG/ML IV BOLUS
INTRAVENOUS | Status: DC | PRN
  Administered 2015-09-01: 50 mg via INTRAVENOUS

## 2015-09-01 NOTE — Clinical Social Work Note (Signed)
Clinical Social Worker will continue to follow patient and pt's family for continued support and to facilitate d/c needs once medically stable.  CIR following patient as well. [name] will need to be given bed offers for SNF placement.   Glendon Axe, MSW, LCSWA [phone] 09/01/2015 4:07 PM

## 2015-09-01 NOTE — Progress Notes (Signed)
ANTICOAGULATION CONSULT NOTE - Follow Up Consult  Pharmacy Consult for heparin/Coumadin Indication: atrial fibrillation  No Known Allergies  Patient Measurements: Height: 5\' 6"  (167.6 cm) Weight: 243 lb 4.8 oz (110.36 kg) IBW/kg (Calculated) : 63.8 Heparin Dosing Weight: 94kg  Vital Signs: Temp: 98.1 F (36.7 C) (06/30 0737) Temp Source: Oral (06/30 0737) BP: 116/82 mmHg (06/30 0800) Pulse Rate: 124 (06/30 0800)  Labs:  Recent Labs  08/30/15 0300  08/31/15 0150 08/31/15 0245 09/01/15 0607  HGB 9.6*  --   --  9.4* 9.6*  HCT 29.7*  --   --  29.3* 30.3*  PLT 274  --   --  306 287  LABPROT 17.4*  --   --  18.2* 18.7*  INR 1.42  --   --  1.50* 1.56*  HEPARINUNFRC  --   < > 0.43 0.46 0.39  CREATININE 1.90*  --   --  1.82*  --   < > = values in this interval not displayed.  Estimated Creatinine Clearance: 48.4 mL/min (by C-G formula based on Cr of 1.82).  . sodium chloride Stopped (08/21/15 1617)  . sodium chloride 250 mL (08/31/15 2100)  . heparin 1,800 Units/hr (09/01/15 [id number])     Assessment: 56 YOM admitted 08/14/2015 with newly diagnosed AFlutter with RVR (s/p DCCV on 6/20). Pharmacy consulted to dose coumadin. INR up to 6.36 and Vitamin K 5mg  IV given x 1 (6/24).He is noted with recent a lower GI bleed (flex sigmoidoscopy 6/23 w/ biopsy)on heparin and coumadin (spoke with Dr. [facility]ron [facility]). Will keep on the lower end of goal for heparin and coumadin. Heparin level therapeutic on 1800 units/hr. No bleeding noted.  INR rising slowly, dosing cautiously given quick INR jump (>6) after 2 doses last week.  Goal of Therapy:  Heparin level 0.3-0.5 units/ml  INR goal 2-2.5 Monitor platelets by anticoagulation protocol: Yes   Plan:  Continue heparin at 1800 units/hr Daily CBC and heparin level Coumadin 5 mg po x 1 Daily INR.  Uvaldo Rising, BCPS  Clinical Pharmacist Pager [phone]  09/01/2015 8:48 AM

## 2015-09-01 NOTE — Progress Notes (Signed)
Patient placed on CPAP for night time./ tolerating well Auto mode used. 4L o2 bleed in. Sats 95% Hr 82 and RR 21.

## 2015-09-01 NOTE — Progress Notes (Signed)
Physical Therapy Wound Treatment Patient Details  Name: Frank Hoover MRN: [id number] Date of Birth: 08-07-1950  Today's Date: 09/01/2015 Time: 3785-8850 Time Calculation (min): 28 min  Subjective  Subjective: Pt pleasant and agreeable to therapy Patient and Family Stated Goals: Did not state Date of Onset:  (Unknown)  Pain Score: Pain Score: 7  Pt premedicated  Wound Assessment  Pressure Ulcer 08/29/15 Unstageable - Full thickness tissue loss in which the base of the ulcer is covered by slough (yellow, tan, gray, green or brown) and/or eschar (tan, brown or black) in the wound bed. 100% yellow-per staff started as blister (Active)  Dressing Type ABD;Barrier Film (skin prep);Gauze (Comment);Moist to dry 09/01/2015 12:13 PM  Dressing Changed;Clean;Dry;Intact 09/01/2015 12:13 PM  Dressing Change Frequency Daily 09/01/2015 12:13 PM  State of Healing Eschar 09/01/2015 12:13 PM  Site / Wound Assessment Yellow;Pink;Black 09/01/2015 12:13 PM  % Wound base Red or Granulating 20% 09/01/2015 12:13 PM  % Wound base Yellow 75% 09/01/2015 12:13 PM  % Wound base Black 5% 09/01/2015 12:13 PM  % Wound base Other (Comment) 0% 09/01/2015 12:13 PM  Peri-wound Assessment Intact 09/01/2015 12:13 PM  Wound Length (cm) 9.5 cm 08/29/2015  2:00 PM  Wound Width (cm) 5.8 cm 08/29/2015  2:00 PM  Wound Depth (cm) 0.1 cm 08/29/2015  2:00 PM  Drainage Amount Minimal 09/01/2015 12:13 PM  Drainage Description Purulent 09/01/2015 12:13 PM  Treatment Debridement (Selective);Hydrotherapy (Pulse lavage);Packing (Saline gauze) 09/01/2015 12:13 PM   Hydrotherapy Pulsed lavage therapy - wound location: Gluteal cleft Pulsed Lavage with Suction (psi): 8 psi (8-12) Pulsed Lavage with Suction - Normal Saline Used: 1000 mL Pulsed Lavage Tip: Tip with splash shield Selective Debridement Selective Debridement - Location: Gluteal cleft Selective Debridement - Tools Used: Scalpel;Forceps Selective Debridement - Tissue Removed: yellow  necrotic tissue   Wound Assessment and Plan  Wound Therapy - Assess/Plan/Recommendations Wound Therapy - Clinical Statement: Continued slow, steady progress with the removal of necrotic tissue. Wound Therapy - Functional Problem List: Decreased tolerance of OOB, acute pain.  Factors Delaying/Impairing Wound Healing: Altered sensation;Diabetes Mellitus;Immobility;Multiple medical problems Hydrotherapy Plan: Debridement;Dressing change;Patient/family education;Pulsatile lavage with suction Wound Therapy - Frequency: 6X / week Wound Therapy - Follow Up Recommendations: Other (comment) (CIR) Wound Plan: See above  Wound Therapy Goals- Improve the function of patient's integumentary system by progressing the wound(s) through the phases of wound healing (inflammation - proliferation - remodeling) by: Decrease Necrotic Tissue to: 25% Decrease Necrotic Tissue - Progress: Progressing toward goal Increase Granulation Tissue to: 75% Increase Granulation Tissue - Progress: Progressing toward goal  Goals will be updated until maximal potential achieved or discharge criteria met.  Discharge criteria: when goals achieved, discharge from hospital, MD decision/surgical intervention, no progress towards goals, refusal/missing three consecutive treatments without notification or medical reason.  GP     Dajanay Northrup 09/01/2015, 12:21 PM [facility] PT [phone]

## 2015-09-01 NOTE — Progress Notes (Signed)
PT Cancellation Note  Patient Details Name: Frank Hoover MRN: [id number] DOB: 14-Aug-1950   Cancelled Treatment:    Reason Eval/Treat Not Completed: Medical issues which prohibited therapy (Pt remains in a-flutter).  Will hold PT until pt more appropriate for exertional activity.   Collie Siad PT, DPT  Pager: [phone] Phone: [phone] 09/01/2015, 9:28 AM

## 2015-09-01 NOTE — Anesthesia Preprocedure Evaluation (Signed)
Anesthesia Evaluation  Patient identified by MRN, date of birth, ID band Patient awake    Reviewed: Allergy & Precautions, H&P , NPO status , Patient's Chart, lab work & pertinent test results, reviewed documented beta blocker date and time   Airway Mallampati: III  TM Distance: >3 FB Neck ROM: Full    Dental no notable dental hx. (+) Edentulous Upper, Edentulous Lower, Dental Advisory Given   Pulmonary sleep apnea and Continuous Positive Airway Pressure Ventilation , COPD, former smoker,    Pulmonary exam normal breath sounds clear to auscultation       Cardiovascular hypertension, Pt. on medications and Pt. on home beta blockers +CHF  + dysrhythmias Atrial Fibrillation  Rhythm:Irregular Rate:Normal     Neuro/Psych negative neurological ROS  negative psych ROS   GI/Hepatic Neg liver ROS, GERD  Medicated and Controlled,  Endo/Other  diabetes, Insulin DependentMorbid obesity  Renal/GU Renal InsufficiencyRenal disease  negative genitourinary   Musculoskeletal  (+) Arthritis , Osteoarthritis,    Abdominal   Peds  Hematology negative hematology ROS (+) anemia ,   Anesthesia Other Findings   Reproductive/Obstetrics negative OB ROS                             Anesthesia Physical Anesthesia Plan  ASA: III  Anesthesia Plan: General   Post-op Pain Management:    Induction: Intravenous  Airway Management Planned: Mask  Additional Equipment:   Intra-op Plan:   Post-operative Plan:   Informed Consent: I have reviewed the patients History and Physical, chart, labs and discussed the procedure including the risks, benefits and alternatives for the proposed anesthesia with the patient or authorized representative who has indicated his/her understanding and acceptance.   Dental advisory given  Plan Discussed with: CRNA  Anesthesia Plan Comments:         Anesthesia Quick  Evaluation

## 2015-09-01 NOTE — Progress Notes (Signed)
Subjective:  No chest pain; breathing better; buttock soreness from stage 2 sacral woumd  Objective:   Vital Signs : Filed Vitals:   08/31/15 2334 09/01/15 0000 09/01/15 0500 09/01/15 0737  BP: 148/84 150/70  152/95  Pulse: 41 99  124  Temp: 98.9 F (37.2 C)   98.1 F (36.7 C)  TempSrc: Oral   Oral  Resp: 19 20  20   Height:      Weight:   243 lb 4.8 oz (110.36 kg)   SpO2: 100% 100%  100%    Intake/Output from previous day:  Intake/Output Summary (Last 24 hours) at 09/01/15 0759 Last data filed at 08/31/15 2300  Gross per 24 hour  Intake 1507.58 ml  Output   2620 ml  Net -1112.42 ml    I/O since admission: -9171  Wt Readings from Last 3 Encounters:  09/01/15 243 lb 4.8 oz (110.36 kg)  06/16/15 262 lb (118.842 kg)  06/22/14 265 lb (120.203 kg)    Medications: . acyclovir ointment   Topical 6 X Daily  . amiodarone  400 mg Oral BID  . collagenase   Topical Daily  . diltiazem  60 mg Oral Q6H  . feeding supplement (ENSURE ENLIVE)  237 mL Oral TID BM  . feeding supplement (PRO-STAT SUGAR FREE 64)  30 mL Oral TID BM  . furosemide  40 mg Oral Daily  . hydrALAZINE  75 mg Oral Q8H  . insulin aspart  0-15 Units Subcutaneous TID WC  . insulin glargine  34 Units Subcutaneous Daily  . isosorbide mononitrate  60 mg Oral Daily  . metoprolol tartrate  100 mg Oral BID  . multivitamin with minerals  1 tablet Oral Daily  . oxybutynin  5 mg Oral TID  . pantoprazole  40 mg Oral Daily  . prochlorperazine  5 mg Intravenous Once  . sodium chloride flush  3 mL Intravenous Q12H  . Warfarin - Pharmacist Dosing Inpatient   Does not apply q1800    . sodium chloride Stopped (08/21/15 1617)  . sodium chloride 250 mL (08/31/15 2100)  . heparin 1,800 Units/hr (09/01/15 6808)    Physical Exam:   General appearance: no distress Neck: no adenopathy, supple, symmetrical, trachea midline, thyroid not enlarged, symmetric, no tenderness/mass/nodules and JVD 8 cm Lungs: clear to  auscultation bilaterally; no wheezing Heart: regular, tachycardic at 811; 1/6 systolic murmur; no rub Abdomen: soft, non-tender; bowel sounds normal; no masses,  no organomegaly Extremities: no edema, redness or tenderness in the calves or thighs Pulses: 2+ and symmetric Skin: Skin color, texture, turgor normal. No rashes or lesions Neurologic: Grossly normal   Rate: 105-120  Rhythm: atrial flutter with variable block  08/31/15 ECG (independently read by me): A flutter with variable block at 98  6/28 ECG (independently read by me): A Flutter 2:1 rate 130  08/26/15 ECG (independently read by me): Accelerated junctional at 120; Q's 3, aVF. Increased QTc.  Lab Results:   Recent Labs  08/30/15 0300 08/31/15 0245  NA 134* 134*  K 3.4* 4.2  CL 104 103  CO2 24 25  GLUCOSE 170* 81  BUN 22* 23*  CREATININE 1.90* 1.82*  CALCIUM 7.9* 8.3*  MG 1.6* 1.9    Hepatic Function Latest Ref Rng 08/30/2015 08/25/2015 08/18/2015  Total Protein 6.5 - 8.1 g/dL 4.8(L) 5.4(L) -  Albumin 3.5 - 5.0 g/dL 1.9(L) 1.8(L) 1.5(L)  AST 15 - 41 U/L 21 38 -  ALT 17 - 63 U/L 24 41 -  Alk  Phosphatase 38 - 126 U/L 45 57 -  Total Bilirubin 0.3 - 1.2 mg/dL 0.4 0.1(L) -  Bilirubin, Direct 0.1 - 0.5 mg/dL 0.1 0.1 -     Recent Labs  08/30/15 0300 08/31/15 0245 09/01/15 0607  WBC 5.4 4.8 4.4  HGB 9.6* 9.4* 9.6*  HCT 29.7* 29.3* 30.3*  MCV 83.0 85.2 85.4  PLT 274 306 287    No results for input(s): TROPONINI in the last 72 hours.  Invalid input(s): CK, MB  Lab Results  Component Value Date   TSH 1.917 08/14/2015   No results for input(s): HGBA1C in the last 72 hours.   Recent Labs  08/30/15 0300  PROT 4.8*  ALBUMIN 1.9*  AST 21  ALT 24  ALKPHOS 45  BILITOT 0.4  BILIDIR 0.1  IBILI 0.3    Recent Labs  09/01/15 0607  INR 1.56*   BNP (last 3 results)  Recent Labs  08/14/15 1501  BNP 609.0*    ProBNP (last 3 results) No results for input(s): PROBNP in the last 8760  hours.   Lipid Panel     Component Value Date/Time   CHOL 79 08/15/2015 0422   TRIG 38 08/15/2015 0422   HDL 34* 08/15/2015 0422   CHOLHDL 2.3 08/15/2015 0422   VLDL 8 08/15/2015 0422   LDLCALC 37 08/15/2015 0422      Imaging:  No results found.    Assessment/Plan:   Principal Problem:   Atrial flutter with rapid ventricular response (HCC) Active Problems:   GERD   DM (diabetes mellitus) (HCC)   HTN (hypertension)   Hyperlipemia   Sleep apnea   Systolic CHF ([place])   Acute respiratory failure (HCC)   Septic shock (HCC)   Acute encephalopathy   Acute kidney injury ([place])   Acute systolic heart failure (HCC)   SVT (supraventricular tachycardia) ([place])   Debility   Blood in stool   Acute blood loss anemia   Abnormal CT scan, colon   Proctosigmoiditis   Lower GI bleed   Noninfectious gastroenteritis, unspecified   Bacteremia   Ischemic colitis ([place])   59M with history of CKD stage III, COPD, HTN, gout, DM admitted after presenting for an echocardiogram ordered by his nephrologist secondary to elevated heart rate and leg edema. He then was referred to the ER after call placed to Dr. Lowanda Foster reporting patient heart rate in the 120s and associated dizziness. He was found to be in newly recognized atrial flutter RVR of unknown duration, acute cardiogenic/septic shock requiring pressors & IV abx (citrobacter bacteremia), acute respiratory failure requiring ventilation, EF 25%. Ischemic workup not yet pursued due to AKI on CKD with Cr peak over 5. Elevated flat troponin 6/12-6/14. Atrial flutter RVR persisted despite amiodarone loading thus underwent TEE/DCCV 08/22/15 with successful conversion to NSR. On (08/23/15) was noted to go into rapid SVT HR 170-180s with associated chest discomfort and fall in BP. Responded once to carotid massage and a second time to IV adenosine.   1. Recent acute cardiogenic/septic shock and acute respiratory failure - off pressors, off vent; now  stable    2. Acute systolic CHF -  Wt today ??? 243 decreased from 258 yesterday. On oral hydralazine/ nitrates/ BB. Compensated presently;  Repeat Echo in -3  months to reassess EF.   3. Atrial flutter RVR - s/p TEE/DCCV 08/22/15. Back in atrial flutter since 6/24, now on amiodarone 400 mg bid, cardizem 60 mg q 6 hrs and metoprolol 100 mg bid. A Flutter with variable  block HR 105 - 125  4. AKI on CKD stage III -  Initial Cr 2.40 on 6/10; Peak Cr 5.32 on 6/14. Improved to Cr 1.78 > 1.94 >1.9 > 1.82.  GFR 44. BMET not yet back from today; will send if not done.  5. Progressive anemia - H/H improved to 11.9/36.2  but 10.7/32.7 > 9.6/29.7 > 9.4/29.3.  6. Hypokalemia- improved to 4.2; Mg 1.9  7. HTN-  Now stable; on meds  Pt continues to be in a flutter; HR improving. Patient has been on heparin since last TEE/DCC; HR better controlled on increased meds,  Will not need another TEE but plan for [facility] today. Discussed with patient and visitor in room with patient.  Troy Sine, MD, [facility] 09/01/2015, 7:59 AM

## 2015-09-01 NOTE — Anesthesia Postprocedure Evaluation (Signed)
Anesthesia Post Note  Patient: Frank Hoover  Procedure(s) Performed: Procedure(s) (LRB): CARDIOVERSION (N/A)  Patient location during evaluation: PACU Anesthesia Type: General Level of consciousness: awake and alert Pain management: pain level controlled Vital Signs Assessment: post-procedure vital signs reviewed and stable Respiratory status: spontaneous breathing, nonlabored ventilation, respiratory function stable and patient connected to nasal cannula oxygen Cardiovascular status: blood pressure returned to baseline and stable Postop Assessment: no signs of nausea or vomiting Anesthetic complications: no    Last Vitals:  Filed Vitals:   09/01/15 1050 09/01/15 1113  BP: 138/86 151/87  Pulse: 71 78  Temp:  36.7 C  Resp: 16 15    Last Pain:  Filed Vitals:   09/01/15 1117  PainSc: 7                  Brave Dack,W. EDMOND

## 2015-09-01 NOTE — Progress Notes (Signed)
With rectal bleeding dark red in mod.amount. MD aware with order to cont. Heparin and hold coumadin. Continue to monitor.

## 2015-09-01 NOTE — Progress Notes (Addendum)
PROGRESS NOTE    Frank Hoover  YOV:[id number] DOB: 03-14-1950 DOA: 08/14/2015 PCP: Alonza Bogus, MD   Brief Narrative:  65 year old BM PMHx COPD, DM Type 2 uncontrolled with complication, HTN, HLD, CKD stage III, gout, Adenomatous colon polyp,Diverticulosis of colon  Originally seen by his Nephrologist in [place], found to be in A. fib with RVR , admitted to [facility] on 08/15/15. 2-D echo showed EF 25%, and he developed septic and cardiogenic shock, w/ temp 104. He was transferred to [facility] and treated for bacteremia due to UTI, requiring pressor support. He required intubation, and was successfully extubated on 6/19. He was transferred to [facility] on 6/20.   Patient was seen by Cardiology and EP for atrial flutter and SVT. He converted to NSR after TEE DCCV on 6/20, and unfortunately went back into A. fib with RVR. While on anticoagulation for atrial fibrillation, he developed bright red blood per rectum. Flexible sigmoidoscopy was significant for ischemic colitis.   Assessment & Plan:   Principal Problem:   Atrial flutter with rapid ventricular response (HCC) Active Problems:   GERD   DM (diabetes mellitus) ([place])   HTN (hypertension)   Hyperlipemia   Sleep apnea   Systolic CHF ([place])   Acute respiratory failure (HCC)   Septic shock (HCC)   Acute encephalopathy   Acute kidney injury ([place])   Acute systolic heart failure (HCC)   SVT (supraventricular tachycardia) ([place])   Debility   Blood in stool   Acute blood loss anemia   Abnormal CT scan, colon   Proctosigmoiditis   Lower GI bleed   Noninfectious gastroenteritis, unspecified   Bacteremia   Ischemic colitis ([place])   HCAP (healthcare-associated pneumonia)   Acute renal failure superimposed on stage 3 chronic kidney disease (HCC)   Metabolic encephalopathy   Abdominal distension   Urinary tract infection, site not specified   Cardiogenic shock (HCC)   Acute systolic CHF (congestive heart failure)  (HCC)   Uncontrolled type 2 diabetes mellitus with complication (HCC)   Urinary retention   Acute hypoxic resp failure - With history of OSA, COPD, and cardiogenic/septic shock - CPAP QHS for sleep apnea  Septic shock / Citrobacter bacteremia in the setting of UTI - Septic shock resolved - Blood cultures and urine culture growing Citrobacter - to complete total of 2 weeks treatment on 6/28 - repeat blood cultures 6/22: One bottle out of 2 sets growing micrococcus species, most likely contamination  Lower GI bleed secondary to ischemic colitis - Secondary to ischemic colitis as confirmed by biopsy ,status post flexible sigmoidoscopy on 6/23 -6/25 transfused 2 units PRBC  - continue with heparin GTT > resumed warfarin as discussed with GI  Recent Labs Lab 08/28/15 1251 08/29/15 0600 08/30/15 0300 08/31/15 0245 09/01/15 0607  HGB 11.9* 10.7* 9.6* 9.4* 9.6*  6/30 ADDENDUM; contacted by RN Chat that patient had a dark red BM moderate amount. Per patient had not had BM in 3 days. Old blood? Will discontinue warfarin, continue heparin to cover for A. fib with RVR. -H/H q 6hr if patient experiences drop in hemoglobin will DC heparin and need to address no anticoagulant at all.  Anemia/acute blood loss anemia?  - due to chronic kidney disease, chronic illness, and GI bleed   HCAP - has completed tx   Cardiogenic shock / AcuteSystolic CHF with EF 74% - management per Cardiology - Diuresis per cardiology strict in and out since admission -9.5 L - Daily weight Filed Weights   08/31/15  5035 09/01/15 0500 09/01/15 1005  Weight: 117.028 kg (258 lb) 110.36 kg (243 lb 4.8 oz) 110.36 kg (243 lb 4.8 oz)  - not on ACEI/ARB/spironolactone, aldactone due to ARF  Atrial fib with RVR  - Management per cardiology - status post s/p TEE/DCCV 08/22/15, converted to NSR, but unfortunately currently back in A. fib with RVR, management per cardiology - currently on amiodarone drip - continue with  heparin GTT > warfarin  Episode of SVT on 08/23/18 - required carotid massage, IV adenosine, EP consult appreciated  HTN - Blood pressure acceptable  AKI on CKD Stage 3 - Continue to monitor - improving despite diuresis - Cr peaked at 5.32 Lab Results  Component Value Date   CREATININE 1.97* 09/01/2015   CREATININE 1.82* 08/31/2015   CREATININE 1.90* 08/30/2015  -Significantly improved  DM type II uncontrolled with complication -4/65 hemoglobin A1c= 7.8 - Lantus 34 units daily  -Moderate SSI    Urinary retention -foley per urology, to remain for several weeks to allow urethra to heal per urology rec  Acute metabolic encephalopathy - much improved today   Unstageable Pressure Injury gluteal cleft  - Wound care following     DVT prophylaxis: Heparin drip Code Status: Full Family Communication: None Disposition Plan: Per cardiology   Consultants:  Cards PCCM Urology Inpatient rehabilitation   Procedures/Significant Events:  6/13 R Femoral CVL > 6/13 6/13 ETT > 6/18 6/13 Lt radial aline > 6/17 6/13 RIJ CVL > 6/19 6/25 2 units PRBC transfusion  Cultures   Antimicrobials:    Devices    LINES / TUBES:      Continuous Infusions: . sodium chloride Stopped (08/21/15 1617)  . sodium chloride 250 mL (08/31/15 2100)  . heparin 1,800 Units/hr (09/01/15 6812)     Subjective: 6/30  A/O 4, NAD, states no bowel movement in at least 3 days    Objective: Filed Vitals:   09/01/15 1050 09/01/15 1113 09/01/15 1600 09/01/15 1942  BP: 138/86 151/87 103/59 145/86  Pulse: 71 78 75 80  Temp:  98 F (36.7 C) 99 F (37.2 C) 98.5 F (36.9 C)  TempSrc:  Oral Oral Oral  Resp: 16 15 14 19   Height:      Weight:      SpO2: 100% 100% 100% 100%    Intake/Output Summary (Last 24 hours) at 09/01/15 2023 Last data filed at 09/01/15 1900  Gross per 24 hour  Intake 334.58 ml  Output   1250 ml  Net -915.42 ml   Filed Weights   08/31/15 0329 09/01/15 0500  09/01/15 1005  Weight: 117.028 kg (258 lb) 110.36 kg (243 lb 4.8 oz) 110.36 kg (243 lb 4.8 oz)    Examination:  General: A/O 4, NAD, No acute respiratory distress Eyes: negative scleral hemorrhage, negative anisocoria, negative icterus ENT: Negative Runny nose, negative gingival bleeding, Neck:  Negative scars, masses, torticollis, lymphadenopathy, JVD Lungs: Clear to auscultation bilaterally without wheezes or crackles Cardiovascular: Regular rate and rhythm without murmur gallop or rub normal S1 and S2 Abdomen: negative abdominal pain, nondistended, positive soft, bowel sounds, no rebound, no ascites, no appreciable mass Extremities: No significant cyanosis, clubbing, or edema bilateral lower extremities Skin: Negative rashes, lesions, ulcers Psychiatric:  Negative depression, negative anxiety, negative fatigue, negative mania  Central nervous system:  Cranial nerves II through XII intact, tongue/uvula midline, all extremities muscle strength 5/5, sensation intact throughout, negative dysarthria, negative expressive aphasia, negative receptive aphasia.  .     Data Reviewed: Care during the  described time interval was provided by me .  I have reviewed this patient's available data, including medical history, events of note, physical examination, and all test results as part of my evaluation. I have personally reviewed and interpreted all radiology studies.  CBC:  Recent Labs Lab 08/28/15 1251 08/29/15 0600 08/30/15 0300 08/31/15 0245 09/01/15 0607  WBC 6.9 6.3 5.4 4.8 4.4  HGB 11.9* 10.7* 9.6* 9.4* 9.6*  HCT 36.2* 32.7* 29.7* 29.3* 30.3*  MCV 84.6 84.3 83.0 85.2 85.4  PLT 296 308 274 306 948   Basic Metabolic Panel:  Recent Labs Lab 08/28/15 1141 08/29/15 0600 08/30/15 0300 08/31/15 0245 09/01/15 1238  NA 137 138 134* 134* 134*  K 3.8 3.6 3.4* 4.2 4.9  CL 106 103 104 103 103  CO2 23 26 24 25 26   GLUCOSE 202* 193* 170* 81 132*  BUN 18 18 22* 23* 25*  CREATININE  1.78* 1.94* 1.90* 1.82* 1.97*  CALCIUM 8.4* 8.4* 7.9* 8.3* 8.7*  MG 1.6*  --  1.6* 1.9  --   PHOS 2.5  --   --   --   --    GFR: Estimated Creatinine Clearance: 44.7 mL/min (by C-G formula based on Cr of 1.97). Liver Function Tests:  Recent Labs Lab 08/30/15 0300  AST 21  ALT 24  ALKPHOS 45  BILITOT 0.4  PROT 4.8*  ALBUMIN 1.9*   No results for input(s): LIPASE, AMYLASE in the last 168 hours. No results for input(s): AMMONIA in the last 168 hours. Coagulation Profile:  Recent Labs Lab 08/28/15 1251 08/29/15 0600 08/30/15 0300 08/31/15 0245 09/01/15 0607  INR 1.40 1.43 1.42 1.50* 1.56*   Cardiac Enzymes: No results for input(s): CKTOTAL, CKMB, CKMBINDEX, TROPONINI in the last 168 hours. BNP (last 3 results) No results for input(s): PROBNP in the last 8760 hours. HbA1C: No results for input(s): HGBA1C in the last 72 hours. CBG:  Recent Labs Lab 08/31/15 1722 08/31/15 2119 09/01/15 0721 09/01/15 1117 09/01/15 1649  GLUCAP 85 85 123* 130* 194*   Lipid Profile: No results for input(s): CHOL, HDL, LDLCALC, TRIG, CHOLHDL, LDLDIRECT in the last 72 hours. Thyroid Function Tests: No results for input(s): TSH, T4TOTAL, FREET4, T3FREE, THYROIDAB in the last 72 hours. Anemia Panel: No results for input(s): VITAMINB12, FOLATE, FERRITIN, TIBC, IRON, RETICCTPCT in the last 72 hours. Urine analysis:    Component Value Date/Time   COLORURINE AMBER* 08/14/2015 2125   APPEARANCEUR HAZY* 08/14/2015 2125   LABSPEC 1.015 08/14/2015 2125   PHURINE 5.0 08/14/2015 2125   GLUCOSEU 100* 08/14/2015 2125   HGBUR LARGE* 08/14/2015 2125   BILIRUBINUR NEGATIVE 08/14/2015 2125   KETONESUR NEGATIVE 08/14/2015 2125   PROTEINUR 100* 08/14/2015 2125   UROBILINOGEN 0.2 12/02/2013 1739   NITRITE NEGATIVE 08/14/2015 2125   LEUKOCYTESUR MODERATE* 08/14/2015 2125   Sepsis Labs: @LABRCNTIP (procalcitonin:4,lacticidven:4)  ) Recent Results (from the past 240 hour(s))  C difficile quick  scan w PCR reflex     Status: None   Collection Time: 08/23/15  4:14 PM  Result Value Ref Range Status   C Diff antigen NEGATIVE NEGATIVE Final   C Diff toxin NEGATIVE NEGATIVE Final   C Diff interpretation Negative for toxigenic C. difficile  Final  Gastrointestinal Panel by PCR , Stool     Status: None   Collection Time: 08/24/15 11:29 AM  Result Value Ref Range Status   Campylobacter species NOT DETECTED NOT DETECTED Final   Plesimonas shigelloides NOT DETECTED NOT DETECTED Final   Salmonella species  NOT DETECTED NOT DETECTED Final   Yersinia enterocolitica NOT DETECTED NOT DETECTED Final   Vibrio species NOT DETECTED NOT DETECTED Final   Vibrio cholerae NOT DETECTED NOT DETECTED Final   Enteroaggregative E coli (EAEC) NOT DETECTED NOT DETECTED Final   Enteropathogenic E coli (EPEC) NOT DETECTED NOT DETECTED Final   Enterotoxigenic E coli (ETEC) NOT DETECTED NOT DETECTED Final   Shiga like toxin producing E coli (STEC) NOT DETECTED NOT DETECTED Final   E. coli O157 NOT DETECTED NOT DETECTED Final   Shigella/Enteroinvasive E coli (EIEC) NOT DETECTED NOT DETECTED Final   Cryptosporidium NOT DETECTED NOT DETECTED Final   Cyclospora cayetanensis NOT DETECTED NOT DETECTED Final   Entamoeba histolytica NOT DETECTED NOT DETECTED Final   Giardia lamblia NOT DETECTED NOT DETECTED Final   Adenovirus F40/41 NOT DETECTED NOT DETECTED Final   Astrovirus NOT DETECTED NOT DETECTED Final   Norovirus GI/GII NOT DETECTED NOT DETECTED Final   Rotavirus A NOT DETECTED NOT DETECTED Final   Sapovirus (I, II, IV, and V) NOT DETECTED NOT DETECTED Final  Culture, blood (Routine X 2) w Reflex to ID Panel     Status: None   Collection Time: 08/24/15 12:20 PM  Result Value Ref Range Status   Specimen Description BLOOD LEFT HAND  Final   Special Requests BOTTLES DRAWN AEROBIC ONLY 10CC  Final   Culture NO GROWTH 5 DAYS  Final   Report Status 08/29/2015 FINAL  Final  Culture, blood (Routine X 2) w Reflex  to ID Panel     Status: Abnormal   Collection Time: 08/24/15 12:40 PM  Result Value Ref Range Status   Specimen Description BLOOD LEFT HAND  Final   Special Requests IN PEDIATRIC BOTTLE 2.5CC  Final   Culture  Setup Time   Final    GRAM POSITIVE COCCI IN CLUSTERS AEROBIC BOTTLE ONLY CRITICAL RESULT CALLED TO, READ BACK BY AND VERIFIED WITH: JAMES LEDFORD,PHARMD @0113  08/26/15 MKELLY    Culture (A)  Final    MICROCOCCUS SPECIES Standardized susceptibility testing for this organism is not available.    Report Status 08/28/2015 FINAL  Final  Blood Culture ID Panel (Reflexed)     Status: None   Collection Time: 08/24/15 12:40 PM  Result Value Ref Range Status   Enterococcus species NOT DETECTED NOT DETECTED Final   Vancomycin resistance NOT DETECTED NOT DETECTED Final   Listeria monocytogenes NOT DETECTED NOT DETECTED Final   Staphylococcus species NOT DETECTED NOT DETECTED Final   Staphylococcus aureus NOT DETECTED NOT DETECTED Final   Methicillin resistance NOT DETECTED NOT DETECTED Final   Streptococcus species NOT DETECTED NOT DETECTED Final   Streptococcus agalactiae NOT DETECTED NOT DETECTED Final   Streptococcus pneumoniae NOT DETECTED NOT DETECTED Final   Streptococcus pyogenes NOT DETECTED NOT DETECTED Final   Acinetobacter baumannii NOT DETECTED NOT DETECTED Final   Enterobacteriaceae species NOT DETECTED NOT DETECTED Final   Enterobacter cloacae complex NOT DETECTED NOT DETECTED Final   Escherichia coli NOT DETECTED NOT DETECTED Final   Klebsiella oxytoca NOT DETECTED NOT DETECTED Final   Klebsiella pneumoniae NOT DETECTED NOT DETECTED Final   Proteus species NOT DETECTED NOT DETECTED Final   Serratia marcescens NOT DETECTED NOT DETECTED Final   Carbapenem resistance NOT DETECTED NOT DETECTED Final   Haemophilus influenzae NOT DETECTED NOT DETECTED Final   Neisseria meningitidis NOT DETECTED NOT DETECTED Final   Pseudomonas aeruginosa NOT DETECTED NOT DETECTED Final    Candida albicans NOT DETECTED NOT DETECTED Final  Candida glabrata NOT DETECTED NOT DETECTED Final   Candida krusei NOT DETECTED NOT DETECTED Final   Candida parapsilosis NOT DETECTED NOT DETECTED Final   Candida tropicalis NOT DETECTED NOT DETECTED Final         Radiology Studies: No results found.      Scheduled Meds: . acyclovir ointment   Topical 6 X Daily  . amiodarone  400 mg Oral BID  . collagenase   Topical Daily  . diltiazem  60 mg Oral Q6H  . feeding supplement (ENSURE ENLIVE)  237 mL Oral TID BM  . feeding supplement (PRO-STAT SUGAR FREE 64)  30 mL Oral TID BM  . furosemide  40 mg Oral Daily  . hydrALAZINE  75 mg Oral Q8H  . insulin aspart  0-15 Units Subcutaneous TID WC  . insulin glargine  34 Units Subcutaneous Daily  . isosorbide mononitrate  60 mg Oral Daily  . metoprolol tartrate  100 mg Oral BID  . multivitamin with minerals  1 tablet Oral Daily  . oxybutynin  5 mg Oral TID  . pantoprazole  40 mg Oral Daily  . prochlorperazine  5 mg Intravenous Once  . sodium chloride flush  3 mL Intravenous Q12H   Continuous Infusions: . sodium chloride Stopped (08/21/15 1617)  . sodium chloride 250 mL (08/31/15 2100)  . heparin 1,800 Units/hr (09/01/15 0607)     LOS: 18 days    Time spent: 40 minutes    Alik Mawson, Geraldo Docker, MD [facility] Pager [phone]   If 7PM-7AM, please contact night-coverage [email or web address] Password [facility] 09/01/2015, 8:23 PM

## 2015-09-01 NOTE — Transfer of Care (Signed)
Immediate Anesthesia Transfer of Care Note  Patient: Frank Hoover  Procedure(s) Performed: Procedure(s): CARDIOVERSION (N/A)  Patient Location: Endoscopy Unit  Anesthesia Type:MAC  Level of Consciousness: awake, alert  and patient cooperative  Airway & Oxygen Therapy: Patient Spontanous Breathing and Patient connected to nasal cannula oxygen  Post-op Assessment: Report given to RN and Post -op Vital signs reviewed and stable  Post vital signs: Reviewed and stable  Last Vitals:  Filed Vitals:   09/01/15 1027 09/01/15 1028  BP:  116/76  Pulse: 68 68  Temp:    Resp: 16 18    Last Pain:  Filed Vitals:   09/01/15 1010  PainSc: 0-No pain      Patients Stated Pain Goal: 0 ([id number])  Complications: No apparent anesthesia complications

## 2015-09-01 NOTE — Procedures (Signed)
Electrical Cardioversion Procedure Note Frank Hoover [id number] 1951/02/15  Procedure: Electrical Cardioversion Indications:  Atrial Flutter  Procedure Details Consent: Risks of procedure as well as the alternatives and risks of each were explained to the (patient/caregiver).  Consent for procedure obtained. Time Out: Verified patient identification, verified procedure, site/side was marked, verified correct patient position, special equipment/implants available, medications/allergies/relevent history reviewed, required imaging and test results available.  Performed  Patient placed on cardiac monitor, pulse oximetry, supplemental oxygen as necessary.  Sedation given: Patient sedated by anesthesia with lidocaine 60 mg and diprovan 50 mg IV. Pacer pads placed anterior and posterior chest.  Cardioverted 1 time(s).  Cardioverted at 120J.  Evaluation Findings: Post procedure EKG shows: NSR Complications: None Patient did tolerate procedure well.   Kirk Ruths 09/01/2015, 9:59 AM

## 2015-09-02 DIAGNOSIS — E785 Hyperlipidemia, unspecified: Secondary | ICD-10-CM

## 2015-09-02 DIAGNOSIS — G4730 Sleep apnea, unspecified: Secondary | ICD-10-CM

## 2015-09-02 LAB — GLUCOSE, CAPILLARY
Glucose-Capillary: 186 mg/dL — ABNORMAL HIGH (ref 65–99)
Glucose-Capillary: 141 mg/dL — ABNORMAL HIGH (ref 65–99)
Glucose-Capillary: 75 mg/dL (ref 65–99)
Glucose-Capillary: 75 mg/dL (ref 65–99)

## 2015-09-02 LAB — HEMOGLOBIN AND HEMATOCRIT, BLOOD
HCT: 29.7 % — ABNORMAL LOW (ref 39.0–52.0)
HCT: 29.9 % — ABNORMAL LOW (ref 39.0–52.0)
Hemoglobin: 9.6 g/dL — ABNORMAL LOW (ref 13.0–17.0)
Hemoglobin: 9.7 g/dL — ABNORMAL LOW (ref 13.0–17.0)

## 2015-09-02 LAB — BASIC METABOLIC PANEL
Anion gap: 6 (ref 5–15)
BUN: 27 mg/dL — ABNORMAL HIGH (ref 6–20)
CO2: 26 mmol/L (ref 22–32)
Calcium: 8.7 mg/dL — ABNORMAL LOW (ref 8.9–10.3)
Chloride: 100 mmol/L — ABNORMAL LOW (ref 101–111)
Creatinine, Ser: 2.03 mg/dL — ABNORMAL HIGH (ref 0.61–1.24)
GFR calc Af Amer: 38 mL/min — ABNORMAL LOW (ref >60)
GFR calc non Af Amer: 33 mL/min — ABNORMAL LOW (ref >60)
Glucose, Bld: 147 mg/dL — ABNORMAL HIGH (ref 65–99)
Potassium: 4 mmol/L (ref 3.5–5.1)
Sodium: 132 mmol/L — ABNORMAL LOW (ref 135–145)

## 2015-09-02 LAB — CBC
HCT: 28.4 % — ABNORMAL LOW (ref 39.0–52.0)
Hemoglobin: 9.1 g/dL — ABNORMAL LOW (ref 13.0–17.0)
MCH: 28 pg (ref 26.0–34.0)
MCHC: 32 g/dL (ref 30.0–36.0)
MCV: 87.4 fL (ref 78.0–100.0)
Platelets: 264 10*3/uL (ref 150–400)
RBC: 3.25 MIL/uL — ABNORMAL LOW (ref 4.22–5.81)
RDW: 17.3 % — ABNORMAL HIGH (ref 11.5–15.5)
WBC: 4.5 10*3/uL (ref 4.0–10.5)

## 2015-09-02 LAB — PROTIME-INR
INR: 1.57 — ABNORMAL HIGH (ref 0.00–1.49)
Prothrombin Time: 18.8 seconds — ABNORMAL HIGH (ref 11.6–15.2)

## 2015-09-02 LAB — HEPARIN LEVEL (UNFRACTIONATED)
Heparin Unfractionated: 0.74 IU/mL — ABNORMAL HIGH (ref 0.30–0.70)
Heparin Unfractionated: 0.49 IU/mL (ref 0.30–0.70)

## 2015-09-02 MED ORDER — DM-GUAIFENESIN ER 30-600 MG PO TB12
1.0000 | ORAL_TABLET | Freq: Two times a day (BID) | ORAL | Status: DC
  Administered 2015-09-02 – 2015-09-04 (×4): 1 via ORAL
  Filled 2015-09-02 (×5): qty 1

## 2015-09-02 MED ORDER — WARFARIN SODIUM 5 MG PO TABS
5.0000 mg | ORAL_TABLET | Freq: Once | ORAL | Status: AC
  Administered 2015-09-02: 5 mg via ORAL
  Filled 2015-09-02: qty 1

## 2015-09-02 MED ORDER — WARFARIN - PHARMACIST DOSING INPATIENT
Freq: Every day | Status: DC
  Administered 2015-09-02: 18:00:00

## 2015-09-02 NOTE — Progress Notes (Signed)
SUBJECTIVE: The patient is doing well today.  He wants to go home.  At this time, he denies chest pain, shortness of breath, or any new concerns.  Marland Kitchen acyclovir ointment   Topical 6 X Daily  . amiodarone  400 mg Oral BID  . collagenase   Topical Daily  . diltiazem  60 mg Oral Q6H  . feeding supplement (ENSURE ENLIVE)  237 mL Oral TID BM  . feeding supplement (PRO-STAT SUGAR FREE 64)  30 mL Oral TID BM  . furosemide  40 mg Oral Daily  . hydrALAZINE  75 mg Oral Q8H  . insulin aspart  0-15 Units Subcutaneous TID WC  . insulin glargine  34 Units Subcutaneous Daily  . isosorbide mononitrate  60 mg Oral Daily  . metoprolol tartrate  100 mg Oral BID  . multivitamin with minerals  1 tablet Oral Daily  . oxybutynin  5 mg Oral TID  . pantoprazole  40 mg Oral Daily  . prochlorperazine  5 mg Intravenous Once  . sodium chloride flush  3 mL Intravenous Q12H   . sodium chloride Stopped (08/21/15 1617)  . sodium chloride 250 mL (08/31/15 2100)  . heparin 1,800 Units/hr (09/01/15 2154)    OBJECTIVE: Physical Exam: Filed Vitals:   09/02/15 0200 09/02/15 0333 09/02/15 0800 09/02/15 0830  BP:  136/81 100/89 138/87  Pulse:  71 75 73  Temp:  98.4 F (36.9 C)  98.1 F (36.7 C)  TempSrc:  Oral  Oral  Resp:  18 21 17   Height:      Weight:  246 lb 9.6 oz (111.857 kg)    SpO2: 100% 100% 96% 97%    Intake/Output Summary (Last 24 hours) at 09/02/15 1054 Last data filed at 09/02/15 0800  Gross per 24 hour  Intake    474 ml  Output   1650 ml  Net  -1176 ml    Telemetry reveals sinus rhythm  GEN- The patient is chronically ill appearing, obese,  alert and oriented x 3 today.   Head- normocephalic, atraumatic Eyes-  Sclera clear, conjunctiva pink Ears- hearing intact Oropharynx- clear Neck- supple  Lungs- diffuse expiratory wheezes normal work of breathing Heart- Regular rate and rhythm  GI- soft, NT, ND, + BS Extremities- no clubbing, cyanosis, + dependant edema Skin- no rash or  lesion Psych- euthymic mood, full affect Neuro- strength and sensation are intact  LABS: Basic Metabolic Panel:  Recent Labs  08/31/15 0245 09/01/15 1238 09/02/15 0430  NA 134* 134* 132*  K 4.2 4.9 4.0  CL 103 103 100*  CO2 25 26 26   GLUCOSE 81 132* 147*  BUN 23* 25* 27*  CREATININE 1.82* 1.97* 2.03*  CALCIUM 8.3* 8.7* 8.7*  MG 1.9  --   --    Liver Function Tests: No results for input(s): AST, ALT, ALKPHOS, BILITOT, PROT, ALBUMIN in the last 72 hours. No results for input(s): LIPASE, AMYLASE in the last 72 hours. CBC:  Recent Labs  09/01/15 0607 09/01/15 2050 09/02/15 0430  WBC 4.4  --  4.5  HGB 9.6* 9.4* 9.1*  HCT 30.3* 29.3* 28.4*  MCV 85.4  --  87.4  PLT 287  --  264    ASSESSMENT AND PLAN:   1. Atrial flutter (Typical) and short RP SVT S/p cardioversion x 2 this admit Would favor ablation for long term treatment.  Has been seen previously by Dr Lovena Le (his note reviewed) Cannot hold anticoagulation given risks of stroke with recent cardioversion.  Will  restart coumadin at this time. Consider ablation prior to discharge (possibly Monday) if no further bleeding issues and patient remains otherwise stable  2. Possible GI bleed Hb stable Will resume coumadin at this time.   3. Acute systolic dysfunction Possibly worsened by RVR Stop cardizem at this time Add ace inhibitor when renal function allows  4. HTN Stable No change required today  Ok to transfer to telemetry today   Thompson Grayer, MD 09/02/2015 10:54 AM

## 2015-09-02 NOTE — Progress Notes (Signed)
Patient refused CPAP tonight. There Is a machine in the room at this time. RN aware. Explained to Patient that if they changed their mind, to just have the RN call Respiratory and we would come set them up. 

## 2015-09-02 NOTE — Clinical Social Work Note (Addendum)
CSW attempted calling patient's wife to give bed offers from SNF's. No answer, no voicemail available.   Will try again later.  Dayton Scrape, CSW [phone]  2:52 pm CSW attempted calling patient's wife again. Same result as before.  Dayton Scrape, [place]

## 2015-09-02 NOTE — Progress Notes (Signed)
ANTICOAGULATION CONSULT NOTE - Follow Up Consult  Pharmacy Consult for heparin Indication: atrial fibrillation  Labs:  Recent Labs  08/31/15 0245 09/01/15 0607 09/01/15 1238 09/01/15 2050 09/02/15 0430  HGB 9.4* 9.6*  --  9.4* 9.1*  HCT 29.3* 30.3*  --  29.3* 28.4*  PLT 306 287  --   --  264  LABPROT 18.2* 18.7*  --   --  18.8*  INR 1.50* 1.56*  --   --  1.57*  HEPARINUNFRC 0.46 0.39  --   --  0.74*  CREATININE 1.82*  --  1.97*  --   --     Assessment/Plan:  65yo male now slightly above goal after being therapeutic on heparin though RN reports that she had to pause heparin gtt, flush, and draw lab from the same line, which likely contributed to higher level. Will continue gtt at current rate for now and confirm stable with additional level.   Wynona Neat, PharmD, BCPS  09/02/2015,5:12 AM

## 2015-09-02 NOTE — Progress Notes (Signed)
Physical Therapy Wound Treatment Patient Details  Name: Frank Hoover MRN: [id number] Date of Birth: 1950/08/03  Today's Date: 09/02/2015 Time: 1210-1250 Time Calculation (min): 40 min  Subjective  Subjective: Pt pleasant and agreeable to therapy Patient and Family Stated Goals: Heal wound Date of Onset:  (Unknown)  Pain Score:  Pt reports 1 instance of pain during session. States it was short-lived and did not request pain medication.  Wound Assessment  Pressure Ulcer 08/29/15 Unstageable - Full thickness tissue loss in which the base of the ulcer is covered by slough (yellow, tan, gray, green or brown) and/or eschar (tan, brown or black) in the wound bed. 100% yellow-per staff started as blister (Active)  Dressing Type ABD;Barrier Film (skin prep);Gauze (Comment);Moist to dry 09/02/2015  1:00 PM  Dressing Changed;Clean;Dry;Intact 09/02/2015  1:00 PM  Dressing Change Frequency Daily 09/02/2015  1:00 PM  State of Healing Eschar 09/02/2015  1:00 PM  Site / Wound Assessment Yellow;Pink;Black 09/02/2015  1:00 PM  % Wound base Red or Granulating 30% 09/02/2015  1:00 PM  % Wound base Yellow 70% 09/02/2015  1:00 PM  % Wound base Black 0% 09/02/2015  1:00 PM  % Wound base Other (Comment) 0% 09/02/2015  1:00 PM  Peri-wound Assessment Intact 09/02/2015  1:00 PM  Wound Length (cm) 9.5 cm 08/29/2015  2:00 PM  Wound Width (cm) 5.8 cm 08/29/2015  2:00 PM  Wound Depth (cm) 0.1 cm 08/29/2015  2:00 PM  Drainage Amount Minimal 09/02/2015  1:00 PM  Drainage Description Purulent 09/02/2015  1:00 PM  Treatment Debridement (Selective);Hydrotherapy (Pulse lavage);Packing (Saline gauze) 09/02/2015  1:00 PM  Santyl applied to wound bed prior to applying dressing.   Hydrotherapy Pulsed lavage therapy - wound location: Gluteal cleft Pulsed Lavage with Suction (psi): 12 psi Pulsed Lavage with Suction - Normal Saline Used: 1000 mL Pulsed Lavage Tip: Tip with splash shield Selective Debridement Selective Debridement - Location:  Gluteal cleft Selective Debridement - Tools Used: Scalpel;Forceps Selective Debridement - Tissue Removed: yellow necrotic tissue   Wound Assessment and Plan  Wound Therapy - Assess/Plan/Recommendations Wound Therapy - Clinical Statement: Continued slow, steady progress with the removal of necrotic tissue. Wound Therapy - Functional Problem List: Decreased tolerance of OOB, acute pain.  Factors Delaying/Impairing Wound Healing: Altered sensation;Diabetes Mellitus;Immobility;Multiple medical problems Hydrotherapy Plan: Debridement;Dressing change;Patient/family education;Pulsatile lavage with suction Wound Therapy - Frequency: 6X / week Wound Therapy - Follow Up Recommendations: Other (comment) (CIR) Wound Plan: See above  Wound Therapy Goals- Improve the function of patient's integumentary system by progressing the wound(s) through the phases of wound healing (inflammation - proliferation - remodeling) by: Decrease Necrotic Tissue to: 25% Decrease Necrotic Tissue - Progress: Progressing toward goal Increase Granulation Tissue to: 75% Increase Granulation Tissue - Progress: Progressing toward goal Goals/treatment plan/discharge plan were made with and agreed upon by patient/family: Yes Time For Goal Achievement: 7 days Wound Therapy - Potential for Goals: Good  Goals will be updated until maximal potential achieved or discharge criteria met.  Discharge criteria: when goals achieved, discharge from hospital, MD decision/surgical intervention, no progress towards goals, refusal/missing three consecutive treatments without notification or medical reason.  GP     Rolinda Roan 09/02/2015, 2:02 PM   Rolinda Roan, PT, DPT Acute Rehabilitation Services Pager: [phone]

## 2015-09-02 NOTE — Progress Notes (Signed)
ANTICOAGULATION CONSULT NOTE - Follow Up Consult  Pharmacy Consult for heparin/Coumadin Indication: atrial fibrillation  No Known Allergies  Patient Measurements: Height: 5\' 6"  (167.6 cm) Weight: 246 lb 9.6 oz (111.857 kg) IBW/kg (Calculated) : 63.8 Heparin Dosing Weight: 94kg  Vital Signs: Temp: 99 F (37.2 C) (07/01 1124) Temp Source: Oral (07/01 1124) BP: 148/83 mmHg (07/01 1124) Pulse Rate: 75 (07/01 1124)  Labs:  Recent Labs  08/31/15 0245 09/01/15 0607 09/01/15 1238 09/01/15 2050 09/02/15 0430 09/02/15 1035  HGB 9.4* 9.6*  --  9.4* 9.1* 9.6*  HCT 29.3* 30.3*  --  29.3* 28.4* 29.7*  PLT 306 287  --   --  264  --   LABPROT 18.2* 18.7*  --   --  18.8*  --   INR 1.50* 1.56*  --   --  1.57*  --   HEPARINUNFRC 0.46 0.39  --   --  0.74* 0.49  CREATININE 1.82*  --  1.97*  --  2.03*  --     Estimated Creatinine Clearance: 43.7 mL/min (by C-G formula based on Cr of 2.03).  . sodium chloride Stopped (08/21/15 1617)  . sodium chloride 250 mL (08/31/15 2100)  . heparin 1,800 Units/hr (09/01/15 2154)     Assessment: 51 YOM admitted 08/14/2015 with newly diagnosed AFlutter with RVR (s/p DCCV on 6/20). Pharmacy consulted to dose coumadin. INR up to 6.36 and Vitamin K 5mg  IV given x 1 (6/24).He is noted with recent a lower GI bleed (flex sigmoidoscopy 6/23 w/ biopsy)on heparin and coumadin (spoke with Dr. Waldron [facility]). Will keep on the lower end of goal for heparin and coumadin. Heparin level therapeutic on 1800 units/hr.   Coumadin held 6/30 due to blood in stool, asked to resume 7/1.   INR rising slowly, dosing cautiously given quick INR jump (>6) after 2 doses last week.  Goal of Therapy:  Heparin level 0.3-0.5 units/ml  INR goal 2-2.5 Monitor platelets by anticoagulation protocol: Yes   Plan:  Continue heparin at 1800 units/hr Daily CBC and heparin level Coumadin 5 mg po x 1 Daily INR.  Uvaldo Rising, BCPS  Clinical Pharmacist Pager [phone]  09/02/2015 12:35 PM

## 2015-09-02 NOTE — Progress Notes (Signed)
PROGRESS NOTE    Frank Hoover  GEX:[id number] DOB: [birth year] DOA: 08/14/2015 PCP: Alonza Bogus, MD   Brief Narrative:  65 year old BM PMHx COPD, DM Type 2 uncontrolled with complication, HTN, HLD, CKD stage III, gout, Adenomatous colon polyp,Diverticulosis of colon  Originally seen by his Nephrologist in [place], found to be in A. fib with RVR , admitted to [facility] on 08/15/15. 2-D echo showed EF 25%, and he developed septic and cardiogenic shock, w/ temp 104. He was transferred to [facility] and treated for bacteremia due to UTI, requiring pressor support. He required intubation, and was successfully extubated on 6/19. He was transferred to [facility] on 6/20.   Patient was seen by Cardiology and EP for atrial flutter and SVT. He converted to NSR after TEE DCCV on 6/20, and unfortunately went back into A. fib with RVR. While on anticoagulation for atrial fibrillation, he developed bright red blood per rectum. Flexible sigmoidoscopy was significant for ischemic colitis.   Assessment & Plan:   Principal Problem:   Atrial flutter with rapid ventricular response (HCC) Active Problems:   GERD   DM (diabetes mellitus) ([place])   HTN (hypertension)   Hyperlipemia   Sleep apnea   Systolic CHF ([place])   Acute respiratory failure (HCC)   Septic shock (HCC)   Acute encephalopathy   Acute kidney injury ([place])   Acute systolic heart failure (HCC)   SVT (supraventricular tachycardia) ([place])   Debility   Blood in stool   Acute blood loss anemia   Abnormal CT scan, colon   Proctosigmoiditis   Lower GI bleed   Noninfectious gastroenteritis, unspecified   Bacteremia   Ischemic colitis ([place])   HCAP (healthcare-associated pneumonia)   Acute renal failure superimposed on stage 3 chronic kidney disease (HCC)   Metabolic encephalopathy   Abdominal distension   Urinary tract infection, site not specified   Cardiogenic shock (HCC)   Acute systolic CHF (congestive heart failure)  (HCC)   Uncontrolled type 2 diabetes mellitus with complication (HCC)   Urinary retention   Acute hypoxic resp failure - With history of OSA, COPD, and cardiogenic/septic shock - CPAP QHS for sleep apnea  Septic shock / Citrobacter bacteremia in the setting of UTI - Septic shock resolved - Blood cultures and urine culture growing Citrobacter - to completed total of 2 weeks treatment on 6/28 - repeat blood cultures 6/22: One bottle out of 2 sets growing micrococcus species, most likely contamination  Lower GI bleed secondary to ischemic colitis - Secondary to ischemic colitis as confirmed by biopsy , S/P flexible sigmoidoscopy on 6/23 -6/25 transfused 2 units PRBC   Recent Labs Lab 08/31/15 0245 09/01/15 0607 09/01/15 2050 09/02/15 0430 09/02/15 1035  HGB 9.4* 9.6* 9.4* 9.1* 9.6*  -H/H stable overnight. Restart Coumadin. Keep on low end of therapeutic range  Anemia/acute blood loss anemia?  - due to chronic kidney disease, chronic illness, and GI bleed   HCAP - has completed tx   Cardiogenic shock / AcuteSystolic CHF with EF 01% - Diuresis per cardiology strict in and out since admission -10.3 L - Daily weight Filed Weights   09/01/15 0500 09/01/15 1005 09/02/15 0333  Weight: 110.36 kg (243 lb 4.8 oz) 110.36 kg (243 lb 4.8 oz) 111.857 kg (246 lb 9.6 oz)  - not on ACEI/ARB/spironolactone, aldactone due to ARF  Atrial fib with RVR  - Management per cardiology - status post s/p TEE/DCCV 08/22/15, converted to NSR, but unfortunately currently back in A. fib with RVR,  management per cardiology - currently on amiodarone drip - continue with heparin GTT > warfarin  HTN - Blood pressure acceptable  AKI on CKD Stage 3 -Cr peaked at 5.32 Lab Results  Component Value Date   CREATININE 2.03* 09/02/2015   CREATININE 1.97* 09/01/2015   CREATININE 1.82* 08/31/2015  -Significantly improved; appears to be at base weight; Cr trending up. Decrease Lasix in Am if Cr continues to  trend up  DM type II uncontrolled with complication -1/93 hemoglobin A1c= 7.8 - Lantus 34 units daily  -Moderate SSI    Urinary retention -foley per urology, to remain for several weeks to allow urethra to heal per urology rec  Acute metabolic encephalopathy - much improved today   Unstageable Pressure Injury gluteal cleft  - Wound care following     DVT prophylaxis: Heparin drip-->Coumadin Code Status: Full Family Communication: None Disposition Plan: Per cardiology   Consultants:  Cards PCCM Urology Inpatient rehabilitation   Procedures/Significant Events:  6/13 R Femoral CVL > 6/13 6/13 ETT > 6/18 6/13 Lt radial aline > 6/17 6/13 RIJ CVL > 6/19 6/25 2 units PRBC transfusion  Cultures   Antimicrobials:    Devices    LINES / TUBES:      Continuous Infusions: . sodium chloride Stopped (08/21/15 1617)  . sodium chloride 250 mL (08/31/15 2100)  . heparin 1,800 Units/hr (09/02/15 1352)     Subjective: 7/1  A/O 4, NAD, positive BM overnight. States a little blood    Objective: Filed Vitals:   09/02/15 0333 09/02/15 0800 09/02/15 0830 09/02/15 1124  BP: 136/81 100/89 138/87 148/83  Pulse: 71 75 73 75  Temp: 98.4 F (36.9 C)  98.1 F (36.7 C) 99 F (37.2 C)  TempSrc: Oral  Oral Oral  Resp: 18 21 17 18   Height:      Weight: 111.857 kg (246 lb 9.6 oz)     SpO2: 100% 96% 97% 95%    Intake/Output Summary (Last 24 hours) at 09/02/15 1454 Last data filed at 09/02/15 0800  Gross per 24 hour  Intake    474 ml  Output   1400 ml  Net   -926 ml   Filed Weights   09/01/15 0500 09/01/15 1005 09/02/15 0333  Weight: 110.36 kg (243 lb 4.8 oz) 110.36 kg (243 lb 4.8 oz) 111.857 kg (246 lb 9.6 oz)    Examination:  General: A/O 4, NAD, No acute respiratory distress Eyes: negative scleral hemorrhage, negative anisocoria, negative icterus ENT: Negative Runny nose, negative gingival bleeding, Neck:  Negative scars, masses, torticollis,  lymphadenopathy, JVD Lungs: Clear to auscultation bilaterally without wheezes or crackles Cardiovascular: Regular rate and rhythm without murmur gallop or rub normal S1 and S2 Abdomen: negative abdominal pain, nondistended, positive soft, bowel sounds, no rebound, no ascites, no appreciable mass Extremities: No significant cyanosis, clubbing, or edema bilateral lower extremities Skin: Negative rashes, lesions, ulcers Psychiatric:  Negative depression, negative anxiety, negative fatigue, negative mania  Central nervous system:  Cranial nerves II through XII intact, tongue/uvula midline, all extremities muscle strength 5/5, sensation intact throughout, negative dysarthria, negative expressive aphasia, negative receptive aphasia.  .     Data Reviewed: Care during the described time interval was provided by me .  I have reviewed this patient's available data, including medical history, events of note, physical examination, and all test results as part of my evaluation. I have personally reviewed and interpreted all radiology studies.  CBC:  Recent Labs Lab 08/29/15 0600 08/30/15 0300 08/31/15 0245 09/01/15  6948 09/01/15 2050 09/02/15 0430 09/02/15 1035  WBC 6.3 5.4 4.8 4.4  --  4.5  --   HGB 10.7* 9.6* 9.4* 9.6* 9.4* 9.1* 9.6*  HCT 32.7* 29.7* 29.3* 30.3* 29.3* 28.4* 29.7*  MCV 84.3 83.0 85.2 85.4  --  87.4  --   PLT 308 274 306 287  --  264  --    Basic Metabolic Panel:  Recent Labs Lab 08/28/15 1141 08/29/15 0600 08/30/15 0300 08/31/15 0245 09/01/15 1238 09/02/15 0430  NA 137 138 134* 134* 134* 132*  K 3.8 3.6 3.4* 4.2 4.9 4.0  CL 106 103 104 103 103 100*  CO2 23 26 24 25 26 26   GLUCOSE 202* 193* 170* 81 132* 147*  BUN 18 18 22* 23* 25* 27*  CREATININE 1.78* 1.94* 1.90* 1.82* 1.97* 2.03*  CALCIUM 8.4* 8.4* 7.9* 8.3* 8.7* 8.7*  MG 1.6*  --  1.6* 1.9  --   --   PHOS 2.5  --   --   --   --   --    GFR: Estimated Creatinine Clearance: 43.7 mL/min (by C-G formula based  on Cr of 2.03). Liver Function Tests:  Recent Labs Lab 08/30/15 0300  AST 21  ALT 24  ALKPHOS 45  BILITOT 0.4  PROT 4.8*  ALBUMIN 1.9*   No results for input(s): LIPASE, AMYLASE in the last 168 hours. No results for input(s): AMMONIA in the last 168 hours. Coagulation Profile:  Recent Labs Lab 08/29/15 0600 08/30/15 0300 08/31/15 0245 09/01/15 0607 09/02/15 0430  INR 1.43 1.42 1.50* 1.56* 1.57*   Cardiac Enzymes: No results for input(s): CKTOTAL, CKMB, CKMBINDEX, TROPONINI in the last 168 hours. BNP (last 3 results) No results for input(s): PROBNP in the last 8760 hours. HbA1C: No results for input(s): HGBA1C in the last 72 hours. CBG:  Recent Labs Lab 09/01/15 0721 09/01/15 1117 09/01/15 1649 09/01/15 2108 09/02/15 1143  GLUCAP 123* 130* 194* 103* 141*   Lipid Profile: No results for input(s): CHOL, HDL, LDLCALC, TRIG, CHOLHDL, LDLDIRECT in the last 72 hours. Thyroid Function Tests: No results for input(s): TSH, T4TOTAL, FREET4, T3FREE, THYROIDAB in the last 72 hours. Anemia Panel: No results for input(s): VITAMINB12, FOLATE, FERRITIN, TIBC, IRON, RETICCTPCT in the last 72 hours. Urine analysis:    Component Value Date/Time   COLORURINE AMBER* 08/14/2015 2125   APPEARANCEUR HAZY* 08/14/2015 2125   LABSPEC 1.015 08/14/2015 2125   PHURINE 5.0 08/14/2015 2125   GLUCOSEU 100* 08/14/2015 2125   HGBUR LARGE* 08/14/2015 2125   BILIRUBINUR NEGATIVE 08/14/2015 2125   KETONESUR NEGATIVE 08/14/2015 2125   PROTEINUR 100* 08/14/2015 2125   UROBILINOGEN 0.2 12/02/2013 1739   NITRITE NEGATIVE 08/14/2015 2125   LEUKOCYTESUR MODERATE* 08/14/2015 2125   Sepsis Labs: @LABRCNTIP (procalcitonin:4,lacticidven:4)  ) Recent Results (from the past 240 hour(s))  C difficile quick scan w PCR reflex     Status: None   Collection Time: 08/23/15  4:14 PM  Result Value Ref Range Status   C Diff antigen NEGATIVE NEGATIVE Final   C Diff toxin NEGATIVE NEGATIVE Final   C Diff  interpretation Negative for toxigenic C. difficile  Final  Gastrointestinal Panel by PCR , Stool     Status: None   Collection Time: 08/24/15 11:29 AM  Result Value Ref Range Status   Campylobacter species NOT DETECTED NOT DETECTED Final   Plesimonas shigelloides NOT DETECTED NOT DETECTED Final   Salmonella species NOT DETECTED NOT DETECTED Final   Yersinia enterocolitica NOT DETECTED NOT DETECTED  Final   Vibrio species NOT DETECTED NOT DETECTED Final   Vibrio cholerae NOT DETECTED NOT DETECTED Final   Enteroaggregative E coli (EAEC) NOT DETECTED NOT DETECTED Final   Enteropathogenic E coli (EPEC) NOT DETECTED NOT DETECTED Final   Enterotoxigenic E coli (ETEC) NOT DETECTED NOT DETECTED Final   Shiga like toxin producing E coli (STEC) NOT DETECTED NOT DETECTED Final   E. coli O157 NOT DETECTED NOT DETECTED Final   Shigella/Enteroinvasive E coli (EIEC) NOT DETECTED NOT DETECTED Final   Cryptosporidium NOT DETECTED NOT DETECTED Final   Cyclospora cayetanensis NOT DETECTED NOT DETECTED Final   Entamoeba histolytica NOT DETECTED NOT DETECTED Final   Giardia lamblia NOT DETECTED NOT DETECTED Final   Adenovirus F40/41 NOT DETECTED NOT DETECTED Final   Astrovirus NOT DETECTED NOT DETECTED Final   Norovirus GI/GII NOT DETECTED NOT DETECTED Final   Rotavirus A NOT DETECTED NOT DETECTED Final   Sapovirus (I, II, IV, and V) NOT DETECTED NOT DETECTED Final  Culture, blood (Routine X 2) w Reflex to ID Panel     Status: None   Collection Time: 08/24/15 12:20 PM  Result Value Ref Range Status   Specimen Description BLOOD LEFT HAND  Final   Special Requests BOTTLES DRAWN AEROBIC ONLY 10CC  Final   Culture NO GROWTH 5 DAYS  Final   Report Status 08/29/2015 FINAL  Final  Culture, blood (Routine X 2) w Reflex to ID Panel     Status: Abnormal   Collection Time: 08/24/15 12:40 PM  Result Value Ref Range Status   Specimen Description BLOOD LEFT HAND  Final   Special Requests IN PEDIATRIC BOTTLE 2.5CC   Final   Culture  Setup Time   Final    GRAM POSITIVE COCCI IN CLUSTERS AEROBIC BOTTLE ONLY CRITICAL RESULT CALLED TO, READ BACK BY AND VERIFIED WITH: JAMES LEDFORD,PHARMD @0113  08/26/15 MKELLY    Culture (A)  Final    MICROCOCCUS SPECIES Standardized susceptibility testing for this organism is not available.    Report Status 08/28/2015 FINAL  Final  Blood Culture ID Panel (Reflexed)     Status: None   Collection Time: 08/24/15 12:40 PM  Result Value Ref Range Status   Enterococcus species NOT DETECTED NOT DETECTED Final   Vancomycin resistance NOT DETECTED NOT DETECTED Final   Listeria monocytogenes NOT DETECTED NOT DETECTED Final   Staphylococcus species NOT DETECTED NOT DETECTED Final   Staphylococcus aureus NOT DETECTED NOT DETECTED Final   Methicillin resistance NOT DETECTED NOT DETECTED Final   Streptococcus species NOT DETECTED NOT DETECTED Final   Streptococcus agalactiae NOT DETECTED NOT DETECTED Final   Streptococcus pneumoniae NOT DETECTED NOT DETECTED Final   Streptococcus pyogenes NOT DETECTED NOT DETECTED Final   Acinetobacter baumannii NOT DETECTED NOT DETECTED Final   Enterobacteriaceae species NOT DETECTED NOT DETECTED Final   Enterobacter cloacae complex NOT DETECTED NOT DETECTED Final   Escherichia coli NOT DETECTED NOT DETECTED Final   Klebsiella oxytoca NOT DETECTED NOT DETECTED Final   Klebsiella pneumoniae NOT DETECTED NOT DETECTED Final   Proteus species NOT DETECTED NOT DETECTED Final   Serratia marcescens NOT DETECTED NOT DETECTED Final   Carbapenem resistance NOT DETECTED NOT DETECTED Final   Haemophilus influenzae NOT DETECTED NOT DETECTED Final   Neisseria meningitidis NOT DETECTED NOT DETECTED Final   Pseudomonas aeruginosa NOT DETECTED NOT DETECTED Final   Candida albicans NOT DETECTED NOT DETECTED Final   Candida glabrata NOT DETECTED NOT DETECTED Final   Candida krusei NOT DETECTED  NOT DETECTED Final   Candida parapsilosis NOT DETECTED NOT  DETECTED Final   Candida tropicalis NOT DETECTED NOT DETECTED Final         Radiology Studies: No results found.      Scheduled Meds: . acyclovir ointment   Topical 6 X Daily  . amiodarone  400 mg Oral BID  . collagenase   Topical Daily  . feeding supplement (ENSURE ENLIVE)  237 mL Oral TID BM  . feeding supplement (PRO-STAT SUGAR FREE 64)  30 mL Oral TID BM  . furosemide  40 mg Oral Daily  . hydrALAZINE  75 mg Oral Q8H  . insulin aspart  0-15 Units Subcutaneous TID WC  . insulin glargine  34 Units Subcutaneous Daily  . isosorbide mononitrate  60 mg Oral Daily  . metoprolol tartrate  100 mg Oral BID  . multivitamin with minerals  1 tablet Oral Daily  . oxybutynin  5 mg Oral TID  . pantoprazole  40 mg Oral Daily  . prochlorperazine  5 mg Intravenous Once  . sodium chloride flush  3 mL Intravenous Q12H  . warfarin  5 mg Oral ONCE-1800  . Warfarin - Pharmacist Dosing Inpatient   Does not apply q1800   Continuous Infusions: . sodium chloride Stopped (08/21/15 1617)  . sodium chloride 250 mL (08/31/15 2100)  . heparin 1,800 Units/hr (09/02/15 1352)     LOS: 19 days    Time spent: 40 minutes    Adriene Knipfer, Geraldo Docker, MD [facility] Pager [phone]   If 7PM-7AM, please contact night-coverage [email or web address] Password [email or web address] 09/02/2015, 2:54 PM

## 2015-09-03 ENCOUNTER — Encounter (HOSPITAL_COMMUNITY): Payer: Self-pay | Admitting: Cardiology

## 2015-09-03 ENCOUNTER — Inpatient Hospital Stay (HOSPITAL_COMMUNITY): Payer: Medicare Other

## 2015-09-03 DIAGNOSIS — I4891 Unspecified atrial fibrillation: Secondary | ICD-10-CM | POA: Insufficient documentation

## 2015-09-03 DIAGNOSIS — R109 Unspecified abdominal pain: Secondary | ICD-10-CM | POA: Insufficient documentation

## 2015-09-03 DIAGNOSIS — R1084 Generalized abdominal pain: Secondary | ICD-10-CM

## 2015-09-03 DIAGNOSIS — R140 Abdominal distension (gaseous): Secondary | ICD-10-CM

## 2015-09-03 LAB — GLUCOSE, CAPILLARY
Glucose-Capillary: 130 mg/dL — ABNORMAL HIGH (ref 65–99)
Glucose-Capillary: 180 mg/dL — ABNORMAL HIGH (ref 65–99)
Glucose-Capillary: 107 mg/dL — ABNORMAL HIGH (ref 65–99)
Glucose-Capillary: 113 mg/dL — ABNORMAL HIGH (ref 65–99)

## 2015-09-03 LAB — CBC
HCT: 31.1 % — ABNORMAL LOW (ref 39.0–52.0)
Hemoglobin: 10 g/dL — ABNORMAL LOW (ref 13.0–17.0)
MCH: 27.2 pg (ref 26.0–34.0)
MCHC: 32.2 g/dL (ref 30.0–36.0)
MCV: 84.7 fL (ref 78.0–100.0)
Platelets: 225 10*3/uL (ref 150–400)
RBC: 3.67 MIL/uL — ABNORMAL LOW (ref 4.22–5.81)
RDW: 17.3 % — ABNORMAL HIGH (ref 11.5–15.5)
WBC: 4.2 10*3/uL (ref 4.0–10.5)

## 2015-09-03 LAB — PROTIME-INR
INR: 1.78 — ABNORMAL HIGH (ref 0.00–1.49)
Prothrombin Time: 20.7 seconds — ABNORMAL HIGH (ref 11.6–15.2)

## 2015-09-03 LAB — BASIC METABOLIC PANEL
Anion gap: 10 (ref 5–15)
BUN: 23 mg/dL — ABNORMAL HIGH (ref 6–20)
CO2: 23 mmol/L (ref 22–32)
Calcium: 9 mg/dL (ref 8.9–10.3)
Chloride: 100 mmol/L — ABNORMAL LOW (ref 101–111)
Creatinine, Ser: 2.09 mg/dL — ABNORMAL HIGH (ref 0.61–1.24)
GFR calc Af Amer: 37 mL/min — ABNORMAL LOW (ref >60)
GFR calc non Af Amer: 32 mL/min — ABNORMAL LOW (ref >60)
Glucose, Bld: 114 mg/dL — ABNORMAL HIGH (ref 65–99)
Potassium: 3.7 mmol/L (ref 3.5–5.1)
Sodium: 133 mmol/L — ABNORMAL LOW (ref 135–145)

## 2015-09-03 LAB — MAGNESIUM: Magnesium: 1.7 mg/dL (ref 1.7–2.4)

## 2015-09-03 LAB — HEPARIN LEVEL (UNFRACTIONATED): Heparin Unfractionated: 0.49 IU/mL (ref 0.30–0.70)

## 2015-09-03 MED ORDER — WARFARIN SODIUM 2.5 MG PO TABS
2.5000 mg | ORAL_TABLET | Freq: Once | ORAL | Status: AC
  Administered 2015-09-03: 2.5 mg via ORAL
  Filled 2015-09-03: qty 1

## 2015-09-03 MED ORDER — AMIODARONE HCL 200 MG PO TABS
200.0000 mg | ORAL_TABLET | Freq: Every day | ORAL | Status: DC
  Administered 2015-09-04 – 2015-09-08 (×5): 200 mg via ORAL
  Filled 2015-09-03 (×5): qty 1

## 2015-09-03 MED ORDER — SODIUM CHLORIDE 0.9 % IV SOLN
INTRAVENOUS | Status: DC
Start: 2015-09-03 — End: 2015-09-04
  Administered 2015-09-03: 20:00:00 via INTRAVENOUS

## 2015-09-03 NOTE — Progress Notes (Signed)
PROGRESS NOTE    Frank Hoover  WJX:[id number] DOB: Dec 16, 1950 DOA: 08/14/2015 PCP: Alonza Bogus, MD   Brief Narrative:  65 year old BM PMHx COPD, DM Type 2 uncontrolled with complication, HTN, HLD, CKD stage III, gout, Adenomatous colon polyp,Diverticulosis of colon  Originally seen by his Nephrologist in [place], found to be in A. fib with RVR , admitted to [facility] on 08/15/15. 2-D echo showed EF 25%, and he developed septic and cardiogenic shock, w/ temp 104. He was transferred to [facility] and treated for bacteremia due to UTI, requiring pressor support. He required intubation, and was successfully extubated on 6/19. He was transferred to [facility] on 6/20.   Patient was seen by Cardiology and EP for atrial flutter and SVT. He converted to NSR after TEE DCCV on 6/20, and unfortunately went back into A. fib with RVR. While on anticoagulation for atrial fibrillation, he developed bright red blood per rectum. Flexible sigmoidoscopy was significant for ischemic colitis.   Assessment & Plan:   Principal Problem:   Atrial flutter with rapid ventricular response (HCC) Active Problems:   GERD   DM (diabetes mellitus) ([place])   HTN (hypertension)   Hyperlipemia   Sleep apnea   Systolic CHF ([place])   Acute respiratory failure (HCC)   Septic shock (HCC)   Acute encephalopathy   Acute kidney injury ([place])   Acute systolic heart failure (HCC)   SVT (supraventricular tachycardia) ([place])   Debility   Blood in stool   Acute blood loss anemia   Abnormal CT scan, colon   Proctosigmoiditis   Lower GI bleed   Noninfectious gastroenteritis, unspecified   Bacteremia   Ischemic colitis ([place])   HCAP (healthcare-associated pneumonia)   Acute renal failure superimposed on stage 3 chronic kidney disease (HCC)   Metabolic encephalopathy   Abdominal distension   Urinary tract infection, site not specified   Cardiogenic shock (HCC)   Acute systolic CHF (congestive heart failure)  (HCC)   Uncontrolled type 2 diabetes mellitus with complication (HCC)   Urinary retention   Acute hypoxic resp failure - With history of OSA, COPD, and cardiogenic/septic shock - CPAP QHS for sleep apnea  Septic shock / Citrobacter bacteremia in the setting of UTI - Septic shock resolved - Blood cultures and urine culture growing Citrobacter - to completed total of 2 weeks treatment on 6/28 - repeat blood cultures 6/22: One bottle out of 2 sets growing micrococcus species, most likely contamination  Lower GI bleed secondary to ischemic colitis - Secondary to ischemic colitis as confirmed by biopsy , S/P flexible sigmoidoscopy on 6/23 -6/25 transfused 2 units PRBC   Recent Labs Lab 09/01/15 2050 09/02/15 0430 09/02/15 1035 09/02/15 1512 09/03/15 0320  HGB 9.4* 9.1* 9.6* 9.7* 10.0*  -H/H stable overnight. Restart Coumadin. Keep on low end of therapeutic range  Anemia/acute blood loss anemia?  - due to chronic kidney disease, chronic illness, and GI bleed   HCAP - has completed tx   Cardiogenic shock / AcuteSystolic CHF with EF 21% - Diuresis per cardiology strict in and out since admission -10.3 L - Daily weight Filed Weights   09/01/15 1005 09/02/15 0333 09/03/15 0157  Weight: 110.36 kg (243 lb 4.8 oz) 111.857 kg (246 lb 9.6 oz) 111.131 kg (245 lb)  - not on ACEI/ARB/spironolactone, aldactone due to ARF  Atrial fib with RVR  - Management per cardiology - status post s/p TEE/DCCV 08/22/15, converted to NSR, but unfortunately currently back in A. fib with RVR, management per  cardiology - currently on amiodarone drip - continue with heparin GTT > warfarin -DCCV scheduled for 7/3  HTN - Blood pressure acceptable  AKI on CKD Stage 3 -Cr peaked at 5.32 Lab Results  Component Value Date   CREATININE 2.09* 09/03/2015   CREATININE 2.03* 09/02/2015   CREATININE 1.97* 09/01/2015  -Significantly improved; appears to be at base weight; Cr trending up. Decrease Lasix in Am  if Cr continues to trend up  DM type II uncontrolled with complication -1/77 hemoglobin A1c= 7.8 - Lantus 34 units daily  -Moderate SSI    Urinary retention -foley per urology, to remain for several weeks to allow urethra to heal per urology rec  Acute metabolic encephalopathy - much improved today   Unstageable Pressure Injury gluteal cleft  - Wound care following     DVT prophylaxis: Heparin drip-->Coumadin Code Status: Full Family Communication: None Disposition Plan: Per cardiology   Consultants:  Cards PCCM Urology Inpatient rehabilitation   Procedures/Significant Events:  6/13 R Femoral CVL > 6/13 6/13 ETT > 6/18 6/13 Lt radial aline > 6/17 6/13 RIJ CVL > 6/19 6/25 2 units PRBC transfusion  Cultures   Antimicrobials:    Devices    LINES / TUBES:      Continuous Infusions: . sodium chloride Stopped (08/21/15 1617)  . sodium chloride 250 mL (09/03/15 0800)  . heparin 1,800 Units/hr (09/03/15 0800)     Subjective: 7/2  A/O 4, NAD, positive BM overnight. States a little blood    Objective: Filed Vitals:   09/03/15 0900 09/03/15 1100 09/03/15 1346 09/03/15 1400  BP: 147/81 163/97 141/81 151/82  Pulse: 83 71 83 74  Temp:  98.2 F (36.8 C)    TempSrc:  Oral    Resp: 20 14 17 19   Height:      Weight:      SpO2: 99% 97% 98% 98%    Intake/Output Summary (Last 24 hours) at 09/03/15 1554 Last data filed at 09/03/15 1400  Gross per 24 hour  Intake    804 ml  Output   5185 ml  Net  -4381 ml   Filed Weights   09/01/15 1005 09/02/15 0333 09/03/15 0157  Weight: 110.36 kg (243 lb 4.8 oz) 111.857 kg (246 lb 9.6 oz) 111.131 kg (245 lb)    Examination:  General: A/O 4, NAD, No acute respiratory distress Eyes: negative scleral hemorrhage, negative anisocoria, negative icterus ENT: Negative Runny nose, negative gingival bleeding, Neck:  Negative scars, masses, torticollis, lymphadenopathy, JVD Lungs: Clear to auscultation bilaterally  without wheezes or crackles Cardiovascular: Regular rate and rhythm without murmur gallop or rub normal S1 and S2 Abdomen: negative abdominal pain, nondistended, positive soft, bowel sounds, no rebound, no ascites, no appreciable mass Extremities: No significant cyanosis, clubbing, or edema bilateral lower extremities Skin: Negative rashes, lesions, ulcers Psychiatric:  Negative depression, negative anxiety, negative fatigue, negative mania  Central nervous system:  Cranial nerves II through XII intact, tongue/uvula midline, all extremities muscle strength 5/5, sensation intact throughout, negative dysarthria, negative expressive aphasia, negative receptive aphasia.  .     Data Reviewed: Care during the described time interval was provided by me .  I have reviewed this patient's available data, including medical history, events of note, physical examination, and all test results as part of my evaluation. I have personally reviewed and interpreted all radiology studies.  CBC:  Recent Labs Lab 08/30/15 0300 08/31/15 0245 09/01/15 0607 09/01/15 2050 09/02/15 0430 09/02/15 1035 09/02/15 1512 09/03/15 0320  WBC 5.4  4.8 4.4  --  4.5  --   --  4.2  HGB 9.6* 9.4* 9.6* 9.4* 9.1* 9.6* 9.7* 10.0*  HCT 29.7* 29.3* 30.3* 29.3* 28.4* 29.7* 29.9* 31.1*  MCV 83.0 85.2 85.4  --  87.4  --   --  84.7  PLT 274 306 287  --  264  --   --  924   Basic Metabolic Panel:  Recent Labs Lab 08/28/15 1141  08/30/15 0300 08/31/15 0245 09/01/15 1238 09/02/15 0430 09/03/15 0320  NA 137  < > 134* 134* 134* 132* 133*  K 3.8  < > 3.4* 4.2 4.9 4.0 3.7  CL 106  < > 104 103 103 100* 100*  CO2 23  < > 24 25 26 26 23   GLUCOSE 202*  < > 170* 81 132* 147* 114*  BUN 18  < > 22* 23* 25* 27* 23*  CREATININE 1.78*  < > 1.90* 1.82* 1.97* 2.03* 2.09*  CALCIUM 8.4*  < > 7.9* 8.3* 8.7* 8.7* 9.0  MG 1.6*  --  1.6* 1.9  --   --  1.7  PHOS 2.5  --   --   --   --   --   --   < > = values in this interval not  displayed. GFR: Estimated Creatinine Clearance: 42.3 mL/min (by C-G formula based on Cr of 2.09). Liver Function Tests:  Recent Labs Lab 08/30/15 0300  AST 21  ALT 24  ALKPHOS 45  BILITOT 0.4  PROT 4.8*  ALBUMIN 1.9*   No results for input(s): LIPASE, AMYLASE in the last 168 hours. No results for input(s): AMMONIA in the last 168 hours. Coagulation Profile:  Recent Labs Lab 08/30/15 0300 08/31/15 0245 09/01/15 0607 09/02/15 0430 09/03/15 0320  INR 1.42 1.50* 1.56* 1.57* 1.78*   Cardiac Enzymes: No results for input(s): CKTOTAL, CKMB, CKMBINDEX, TROPONINI in the last 168 hours. BNP (last 3 results) No results for input(s): PROBNP in the last 8760 hours. HbA1C: No results for input(s): HGBA1C in the last 72 hours. CBG:  Recent Labs Lab 09/02/15 1143 09/02/15 1705 09/02/15 2139 09/03/15 0740 09/03/15 1134  GLUCAP 141* 75 75 130* 180*   Lipid Profile: No results for input(s): CHOL, HDL, LDLCALC, TRIG, CHOLHDL, LDLDIRECT in the last 72 hours. Thyroid Function Tests: No results for input(s): TSH, T4TOTAL, FREET4, T3FREE, THYROIDAB in the last 72 hours. Anemia Panel: No results for input(s): VITAMINB12, FOLATE, FERRITIN, TIBC, IRON, RETICCTPCT in the last 72 hours. Urine analysis:    Component Value Date/Time   COLORURINE AMBER* 08/14/2015 2125   APPEARANCEUR HAZY* 08/14/2015 2125   LABSPEC 1.015 08/14/2015 2125   PHURINE 5.0 08/14/2015 2125   GLUCOSEU 100* 08/14/2015 2125   HGBUR LARGE* 08/14/2015 2125   BILIRUBINUR NEGATIVE 08/14/2015 2125   KETONESUR NEGATIVE 08/14/2015 2125   PROTEINUR 100* 08/14/2015 2125   UROBILINOGEN 0.2 12/02/2013 1739   NITRITE NEGATIVE 08/14/2015 2125   LEUKOCYTESUR MODERATE* 08/14/2015 2125   Sepsis Labs: @LABRCNTIP (procalcitonin:4,lacticidven:4)  ) No results found for this or any previous visit (from the past 240 hour(s)).       Radiology Studies: No results found.      Scheduled Meds: . acyclovir ointment    Topical 6 X Daily  . [START ON 09/04/2015] amiodarone  200 mg Oral Daily  . collagenase   Topical Daily  . dextromethorphan-guaiFENesin  1 tablet Oral BID  . feeding supplement (ENSURE ENLIVE)  237 mL Oral TID BM  . feeding supplement (PRO-STAT SUGAR FREE  64)  30 mL Oral TID BM  . furosemide  40 mg Oral Daily  . hydrALAZINE  75 mg Oral Q8H  . insulin aspart  0-15 Units Subcutaneous TID WC  . insulin glargine  34 Units Subcutaneous Daily  . isosorbide mononitrate  60 mg Oral Daily  . metoprolol tartrate  100 mg Oral BID  . multivitamin with minerals  1 tablet Oral Daily  . oxybutynin  5 mg Oral TID  . pantoprazole  40 mg Oral Daily  . prochlorperazine  5 mg Intravenous Once  . sodium chloride flush  3 mL Intravenous Q12H  . warfarin  2.5 mg Oral ONCE-1800  . Warfarin - Pharmacist Dosing Inpatient   Does not apply q1800   Continuous Infusions: . sodium chloride Stopped (08/21/15 1617)  . sodium chloride 250 mL (09/03/15 0800)  . heparin 1,800 Units/hr (09/03/15 0800)     LOS: 20 days    Time spent: 40 minutes    WOODS, Geraldo Docker, MD [facility] Pager [phone]   If 7PM-7AM, please contact night-coverage [email or web address] Password [facility] 09/03/2015, 3:53 PM

## 2015-09-03 NOTE — Progress Notes (Signed)
ANTICOAGULATION CONSULT NOTE - Follow Up Consult  Pharmacy Consult for heparin/Coumadin Indication: atrial fibrillation  No Known Allergies  Patient Measurements: Height: 5\' 6"  (167.6 cm) Weight: 245 lb (111.131 kg) IBW/kg (Calculated) : 63.8 Heparin Dosing Weight: 94kg  Vital Signs: Temp: 99.2 F (37.3 C) (07/02 0344) Temp Source: Oral (07/02 0344) BP: 164/87 mmHg (07/02 0800) Pulse Rate: 85 (07/02 0800)  Labs:  Recent Labs  09/01/15 0607 09/01/15 1238  09/02/15 0430 09/02/15 1035 09/02/15 1512 09/03/15 0320  HGB 9.6*  --   < > 9.1* 9.6* 9.7* 10.0*  HCT 30.3*  --   < > 28.4* 29.7* 29.9* 31.1*  PLT 287  --   --  264  --   --  225  LABPROT 18.7*  --   --  18.8*  --   --  20.7*  INR 1.56*  --   --  1.57*  --   --  1.78*  HEPARINUNFRC 0.39  --   --  0.74* 0.49  --  0.49  CREATININE  --  1.97*  --  2.03*  --   --  2.09*  < > = values in this interval not displayed.  Estimated Creatinine Clearance: 42.3 mL/min (by C-G formula based on Cr of 2.09).  . sodium chloride Stopped (08/21/15 1617)  . sodium chloride 250 mL (09/03/15 0800)  . heparin 1,800 Units/hr (09/03/15 0800)     Assessment: 15 YOM admitted 08/14/2015 with newly diagnosed AFlutter with RVR (s/p DCCV on 6/20). Pharmacy consulted to dose coumadin. INR up to 6.36 and Vitamin K 5mg  IV given x 1 (6/24).He is noted with recent a lower GI bleed (flex sigmoidoscopy 6/23 w/ biopsy)on heparin and coumadin (spoke with Dr. Waldron [facility]). Will keep on the lower end of goal for heparin and coumadin. Heparin level therapeutic on 1800 units/hr.   Coumadin held 6/30 due to blood in stool, resumed 7/1.   INR rising slowly, dosing cautiously given quick INR jump (>6) after 2 doses last week.  Goal of Therapy:  Heparin level 0.3-0.5 units/ml  INR goal 2-2.5 Monitor platelets by anticoagulation protocol: Yes   Plan:  Continue heparin at 1800 units/hr Daily CBC and heparin level Coumadin 2.5 mg po x 1 Daily  INR.  Uvaldo Rising, BCPS  Clinical Pharmacist Pager [phone]  09/03/2015 9:22 AM

## 2015-09-03 NOTE — Progress Notes (Signed)
Patient refuse NIV for the night, RN stated. RN aware.

## 2015-09-03 NOTE — Progress Notes (Signed)
Episodes of nausea and vomiting-zofran given

## 2015-09-03 NOTE — Progress Notes (Signed)
SUBJECTIVE: The patient is doing well today.  +nausea this am.   At this time, he denies chest pain, shortness of breath, or any new concerns.  Marland Kitchen acyclovir ointment   Topical 6 X Daily  . amiodarone  400 mg Oral BID  . collagenase   Topical Daily  . dextromethorphan-guaiFENesin  1 tablet Oral BID  . feeding supplement (ENSURE ENLIVE)  237 mL Oral TID BM  . feeding supplement (PRO-STAT SUGAR FREE 64)  30 mL Oral TID BM  . furosemide  40 mg Oral Daily  . hydrALAZINE  75 mg Oral Q8H  . insulin aspart  0-15 Units Subcutaneous TID WC  . insulin glargine  34 Units Subcutaneous Daily  . isosorbide mononitrate  60 mg Oral Daily  . metoprolol tartrate  100 mg Oral BID  . multivitamin with minerals  1 tablet Oral Daily  . oxybutynin  5 mg Oral TID  . pantoprazole  40 mg Oral Daily  . prochlorperazine  5 mg Intravenous Once  . sodium chloride flush  3 mL Intravenous Q12H  . warfarin  2.5 mg Oral ONCE-1800  . Warfarin - Pharmacist Dosing Inpatient   Does not apply q1800   . sodium chloride Stopped (08/21/15 1617)  . sodium chloride 250 mL (09/03/15 0800)  . heparin 1,800 Units/hr (09/03/15 0800)    OBJECTIVE: Physical Exam: Filed Vitals:   09/03/15 0600 09/03/15 0700 09/03/15 0800 09/03/15 0900  BP: 146/87 166/96 164/87 147/81  Pulse: 81 74 85 83  Temp:      TempSrc:      Resp: 18 14 20 20   Height:      Weight:      SpO2: 99% 98% 99% 99%    Intake/Output Summary (Last 24 hours) at 09/03/15 1032 Last data filed at 09/03/15 1000  Gross per 24 hour  Intake    714 ml  Output   4810 ml  Net  -4096 ml    Telemetry reveals sinus rhythm  GEN- The patient is chronically ill appearing, obese,  alert and oriented x 3 today.   Head- normocephalic, atraumatic Eyes-  Sclera clear, conjunctiva pink Ears- hearing intact Oropharynx- clear Neck- supple  Lungs- diffuse expiratory wheezes normal work of breathing Heart- Regular rate and rhythm  GI- + distended, + BS Extremities- no  clubbing, cyanosis, + dependant edema Skin- no rash or lesion Psych- euthymic mood, full affect Neuro- strength and sensation are intact  LABS: Basic Metabolic Panel:  Recent Labs  09/02/15 0430 09/03/15 0320  NA 132* 133*  K 4.0 3.7  CL 100* 100*  CO2 26 23  GLUCOSE 147* 114*  BUN 27* 23*  CREATININE 2.03* 2.09*  CALCIUM 8.7* 9.0  MG  --  1.7   Liver Function Tests: No results for input(s): AST, ALT, ALKPHOS, BILITOT, PROT, ALBUMIN in the last 72 hours. No results for input(s): LIPASE, AMYLASE in the last 72 hours. CBC:  Recent Labs  09/02/15 0430  09/02/15 1512 09/03/15 0320  WBC 4.5  --   --  4.2  HGB 9.1*  < > 9.7* 10.0*  HCT 28.4*  < > 29.9* 31.1*  MCV 87.4  --   --  84.7  PLT 264  --   --  225  < > = values in this interval not displayed.  ASSESSMENT AND PLAN:   1. Atrial flutter (Typical) and short RP SVT S/p cardioversion x 2 this admit Would favor ablation for long term treatment.  Has been seen  previously by Dr Lovena Le (his note reviewed) Cannot hold anticoagulation given risks of stroke with recent cardioversion.  On coumadin and heparin Consider ablation prior to discharge (possibly Monday) if no further bleeding issues and patient remains otherwise stable.  Risks of ablation discussed with patient who wishes to proceed.  Will tentatively place on schedule for tomorrow.  NPO after midnight. Reduce amiodarone to 279m daily given nausea.  2. Possible GI bleed Hb stable Would consider having GI re-evaluate given nausea and worsening distension.  3. Acute systolic dysfunction Possibly worsened by RVR Add ace inhibitor when renal function allows  4. HTN Stable No change required today  Ok to transfer to telemetry from my standpoint.   JThompson Grayer MD 09/03/2015 10:32 AM

## 2015-09-04 ENCOUNTER — Encounter (HOSPITAL_COMMUNITY)
Admission: EM | Disposition: A | Discharge status: Skilled Nursing Facility | Payer: Self-pay | Source: Home / Self Care | Attending: Internal Medicine

## 2015-09-04 LAB — CBC
HCT: 31.3 % — ABNORMAL LOW (ref 39.0–52.0)
Hemoglobin: 10 g/dL — ABNORMAL LOW (ref 13.0–17.0)
MCH: 27.2 pg (ref 26.0–34.0)
MCHC: 31.9 g/dL (ref 30.0–36.0)
MCV: 85.1 fL (ref 78.0–100.0)
Platelets: 202 10*3/uL (ref 150–400)
RBC: 3.68 MIL/uL — ABNORMAL LOW (ref 4.22–5.81)
RDW: 17.4 % — ABNORMAL HIGH (ref 11.5–15.5)
WBC: 4.3 10*3/uL (ref 4.0–10.5)

## 2015-09-04 LAB — BASIC METABOLIC PANEL
Anion gap: 22 — ABNORMAL HIGH (ref 5–15)
BUN: 23 mg/dL — ABNORMAL HIGH (ref 6–20)
CO2: 23 mmol/L (ref 22–32)
Calcium: 9.9 mg/dL (ref 8.9–10.3)
Chloride: 96 mmol/L — ABNORMAL LOW (ref 101–111)
Creatinine, Ser: 2.13 mg/dL — ABNORMAL HIGH (ref 0.61–1.24)
GFR calc Af Amer: 36 mL/min — ABNORMAL LOW (ref >60)
GFR calc non Af Amer: 31 mL/min — ABNORMAL LOW (ref >60)
Glucose, Bld: 102 mg/dL — ABNORMAL HIGH (ref 65–99)
Potassium: 3.3 mmol/L — ABNORMAL LOW (ref 3.5–5.1)
Sodium: 141 mmol/L (ref 135–145)

## 2015-09-04 LAB — GLUCOSE, CAPILLARY
Glucose-Capillary: 124 mg/dL — ABNORMAL HIGH (ref 65–99)
Glucose-Capillary: 177 mg/dL — ABNORMAL HIGH (ref 65–99)
Glucose-Capillary: 132 mg/dL — ABNORMAL HIGH (ref 65–99)
Glucose-Capillary: 141 mg/dL — ABNORMAL HIGH (ref 65–99)

## 2015-09-04 LAB — MAGNESIUM: Magnesium: 1.7 mg/dL (ref 1.7–2.4)

## 2015-09-04 LAB — HEPARIN LEVEL (UNFRACTIONATED): Heparin Unfractionated: 0.57 IU/mL (ref 0.30–0.70)

## 2015-09-04 LAB — PROTIME-INR
INR: 2.64 — ABNORMAL HIGH (ref 0.00–1.49)
Prothrombin Time: 27.8 seconds — ABNORMAL HIGH (ref 11.6–15.2)

## 2015-09-04 SURGERY — A-FLUTTER/A-TACH/SVT ABLATION
Anesthesia: LOCAL

## 2015-09-04 MED ORDER — POTASSIUM CHLORIDE CRYS ER 20 MEQ PO TBCR
40.0000 meq | EXTENDED_RELEASE_TABLET | Freq: Two times a day (BID) | ORAL | Status: AC
  Administered 2015-09-04 – 2015-09-05 (×3): 40 meq via ORAL
  Filled 2015-09-04 (×3): qty 2

## 2015-09-04 MED ORDER — PROCHLORPERAZINE EDISYLATE 5 MG/ML IJ SOLN
10.0000 mg | Freq: Four times a day (QID) | INTRAMUSCULAR | Status: DC | PRN
  Administered 2015-09-04: 10 mg via INTRAVENOUS
  Filled 2015-09-04 (×4): qty 2

## 2015-09-04 MED ORDER — SENNOSIDES-DOCUSATE SODIUM 8.6-50 MG PO TABS
1.0000 | ORAL_TABLET | Freq: Two times a day (BID) | ORAL | Status: DC
  Administered 2015-09-04 – 2015-09-08 (×6): 1 via ORAL
  Filled 2015-09-04 (×7): qty 1

## 2015-09-04 MED ORDER — SODIUM CHLORIDE 0.9 % IV SOLN: INTRAVENOUS | Status: DC

## 2015-09-04 MED ORDER — POLYETHYLENE GLYCOL 3350 17 G PO PACK
17.0000 g | PACK | Freq: Two times a day (BID) | ORAL | Status: DC
  Administered 2015-09-04 – 2015-09-06 (×3): 17 g via ORAL
  Filled 2015-09-04 (×6): qty 1

## 2015-09-04 NOTE — Progress Notes (Signed)
SUBJECTIVE: The patient is nauseated and has abdominal pain this morning that began last night.  He also had 2 episodes of vomting.   At this time, he denies chest pain, shortness of breath  . acyclovir ointment   Topical 6 X Daily  . amiodarone  200 mg Oral Daily  . collagenase   Topical Daily  . dextromethorphan-guaiFENesin  1 tablet Oral BID  . feeding supplement (ENSURE ENLIVE)  237 mL Oral TID BM  . feeding supplement (PRO-STAT SUGAR FREE 64)  30 mL Oral TID BM  . furosemide  40 mg Oral Daily  . hydrALAZINE  75 mg Oral Q8H  . insulin aspart  0-15 Units Subcutaneous TID WC  . insulin glargine  34 Units Subcutaneous Daily  . isosorbide mononitrate  60 mg Oral Daily  . metoprolol tartrate  100 mg Oral BID  . multivitamin with minerals  1 tablet Oral Daily  . oxybutynin  5 mg Oral TID  . pantoprazole  40 mg Oral Daily  . prochlorperazine  5 mg Intravenous Once  . Warfarin - Pharmacist Dosing Inpatient   Does not apply q1800   . sodium chloride 10 mL/hr at 09/03/15 1930  . heparin 1,650 Units/hr (09/04/15 0804)    OBJECTIVE: Physical Exam: Filed Vitals:   09/04/15 0400 09/04/15 0500 09/04/15 0600 09/04/15 0700  BP: 166/74 153/56 146/57 169/76  Pulse: 78 80 73 77  Temp: 98 F (36.7 C)   98 F (36.7 C)  TempSrc: Oral   Oral  Resp: 18 20 17 20   Height:      Weight:      SpO2: 100% 99% 98% 98%    Intake/Output Summary (Last 24 hours) at 09/04/15 7893 Last data filed at 09/04/15 0700  Gross per 24 hour  Intake    403 ml  Output   2140 ml  Net  -1737 ml    Telemetry reveals sinus rhythm  GEN- The patient is chronically ill appearing, obese,  alert and oriented x 3 today.   Head- normocephalic, atraumatic Eyes-  Sclera clear, conjunctiva pink Ears- hearing intact Oropharynx- clear Neck- supple  Lungs- diffuse expiratory wheezes normal work of breathing Heart- Regular rate and rhythm  GI- + distended, + BS, + diffuse tenderness Extremities- no clubbing,  cyanosis, + dependent edema Skin- no rash or lesion Psych- euthymic mood, full affect Neuro- strength and sensation are intact  LABS: Basic Metabolic Panel:  Recent Labs  09/03/15 0320 09/04/15 0402  NA 133* 141  K 3.7 3.3*  CL 100* 96*  CO2 23 23  GLUCOSE 114* 102*  BUN 23* 23*  CREATININE 2.09* 2.13*  CALCIUM 9.0 9.9  MG 1.7 1.7   CBC:  Recent Labs  09/03/15 0320 09/04/15 0402  WBC 4.2 4.3  HGB 10.0* 10.0*  HCT 31.1* 31.3*  MCV 84.7 85.1  PLT 225 202    ASSESSMENT AND PLAN:   1. Atrial flutter (Typical) and short RP SVT S/p cardioversion x 2 this admit Pt agreeable to ablation, but in the setting of ongoing nausea, vomiting, and abdominal pain, would not recommend procedures today unless significantly improved this afternoon.  He is maintaining SR currently on low dose amiodarone  INR 2.64 today, would discontinue Heparin  Continue amiodarone to 211m daily - would discontinue after ablation   2. Possible GI bleed Hb stable   3. Acute on chronic systolic heart failure  Continue Metoprolol Consider ACE-I if renal function improves Would re-evaluate EF in several months after  ablation   4. HTN Elevated this morning in the setting of abdominal pain Continue current medications  5.  Nausea/vomiting/abdominal pain Pt reports abdominal pain new this morning He had 2 episodes of vomiting last night which is also new per patient Would consider GI re-evaluation   Dr Lovena Le to see later today.     Chanetta Marshall, NP 09/04/2015 9:37 AM  EP Attending  Patient seen and examined. Agree with above. On my rounds, the patient is shaking and almost rigoring though he has no specific complaint. He has also been nauseated. I have recommended that he hold off on EP study and ablation, espcecially now that he has worsening symptoms.   Mikle Bosworth.D.

## 2015-09-04 NOTE — Progress Notes (Signed)
Physical Therapy Treatment Patient Details Name: Frank Hoover MRN: [id number] DOB: 08-28-1950 Today's Date: 09/04/2015    History of Present Illness 65 year old male with PMH of GERD, OSA, COPD, admitted with dizziness, tachycardia and leg edema. Dx with acute cardiogenic/septic shock, acute diastolic CHF, A-flutter, intubated 6/13-6/18. Pt underwent TEE with cardioversion 6/20. Developed significant anemia, Hemoccult-positive stool S/P Flex Sig 6/23, suspicious for ischemic colitis.    PT Comments    Patient progressing slowly, but able to participate once aroused.  Had nausea and vomiting earlier and may have been lethargic due to medications.  Feel continued skilled PT in the acute setting indicated to progress to goals.  Would like to see him walk next session for progress towards CIR level therapies.   Follow Up Recommendations  CIR     Equipment Recommendations  Other (comment) (TBA at next venue)    Recommendations for Other Services       Precautions / Restrictions Precautions Precautions: Fall    Mobility  Bed Mobility Overal bed mobility: Needs Assistance;+2 for physical assistance       Supine to sit: Mod assist;+2 for physical assistance     General bed mobility comments: assist to scoot hips and lift trunk upright; cues for technique, but pt not able to lift trunk without +2 A  Transfers Overall transfer level: Needs assistance Equipment used: 2 person hand held assist Transfers: Sit to/from [facility] Sit to Stand: Mod assist;+2 physical assistance Stand pivot transfers: Mod assist;+2 physical assistance       General transfer comment: cues for pushing up from EOB then reaching for hands for support with stand pivot to recliner  Ambulation/Gait                 Stairs            Wheelchair Mobility    Modified Rankin (Stroke Patients Only)       Balance Overall balance assessment: Needs  assistance Sitting-balance support: Feet unsupported;Feet supported Sitting balance-Leahy Scale: Good Sitting balance - Comments: lethargic initially, but balancing at EOB without foot support initially, then scooted to EOB with improved base of support   Standing balance support: Bilateral upper extremity supported Standing balance-Leahy Scale: Poor Standing balance comment: HHA bilat for balance                    Cognition Arousal/Alertness: Lethargic Behavior During Therapy: WFL for tasks assessed/performed Overall Cognitive Status: Impaired/Different from baseline Area of Impairment: Following commands       Following Commands: Follows one step commands with increased time     Problem Solving: Slow processing;Requires verbal cues;Decreased initiation General Comments: improved during session as arousal level increased    Exercises      General Comments        Pertinent Vitals/Pain Faces Pain Scale: Hurts little more Pain Location: buttocks Pain Descriptors / Indicators: Sore Pain Intervention(s): Monitored during session;Repositioned    Home Living                      Prior Function            PT Goals (current goals can now be found in the care plan section) Acute Rehab PT Goals Patient Stated Goal: to get stronger PT Goal Formulation: With patient Time For Goal Achievement: 09/11/15 Potential to Achieve Goals: Good Progress towards PT goals: Progressing toward goals    Frequency  Min 3X/week  PT Plan Current plan remains appropriate    Co-evaluation PT/OT/SLP Co-Evaluation/Treatment: Yes Reason for Co-Treatment: Complexity of the patient's impairments (multi-system involvement);For patient/therapist safety PT goals addressed during session: Mobility/safety with mobility;Balance;Other (comment) (caregiver education)       End of Session Equipment Utilized During Treatment: Gait belt Activity Tolerance: Patient tolerated  treatment well Patient left: with call bell/phone within reach;in chair     Time: 1143-1200 PT Time Calculation (min) (ACUTE ONLY): 17 min  Charges:  $Therapeutic Activity: 8-22 mins                    G Codes:      Reginia Naas 10/03/15, 3:10 PM  Magda Kiel, [place] 2015/10/03

## 2015-09-04 NOTE — Progress Notes (Signed)
Continuing to follow for possible IP rehab as appropriate and when pt. medically ready. Pt. may require further cardiac intervention per EP note this am.  I will follow along.    [place] Admissions Coordinator Cell [phone] Office [phone]

## 2015-09-04 NOTE — Progress Notes (Signed)
Spoke with Dr. Thereasa Solo regarding pt need for foley to continue to be in place. Per Dr Thereasa Solo, he has spoken with Urology (which placed the foley) and stated that foley should be left in place d/t difficultly placing the cathether and once pt discharged from the hospital, to follow up with Urology to remove foley.

## 2015-09-04 NOTE — Progress Notes (Signed)
CSW spoke with [name] regarding SNF bed offers. CSW to meet with family at 12:30 PM to further discuss.    Frank Dyer, LCSW [facility] ED/44M Clinical Social Worker [phone]

## 2015-09-04 NOTE — Progress Notes (Signed)
Physical Therapy Wound Treatment Patient Details  Name: Frank Hoover MRN: [id number] Date of Birth: November 06, 1950  Today's Date: 09/04/2015 Time: 7628-3151 Time Calculation (min): 27 min  Subjective  Subjective: Pt reports being cold but no other complaints Patient and Family Stated Goals: Heal wound Date of Onset:  (Unknown)  Pain Score:  Premedicated. No signs of pain.  Wound Assessment  Pressure Ulcer 08/29/15 Unstageable - Full thickness tissue loss in which the base of the ulcer is covered by slough (yellow, tan, gray, green or brown) and/or eschar (tan, brown or black) in the wound bed. 100% yellow-per staff started as blister (Active)  Dressing Type ABD;Barrier Film (skin prep);Gauze (Comment);Moist to dry 09/04/2015 12:26 PM  Dressing Changed;Clean;Dry;Intact 09/04/2015 12:26 PM  Dressing Change Frequency Daily 09/04/2015 12:26 PM  State of Healing Eschar 09/04/2015 12:26 PM  Site / Wound Assessment Yellow;Pink;Black 09/04/2015 12:26 PM  % Wound base Red or Granulating 30% 09/04/2015 12:26 PM  % Wound base Yellow 65% 09/04/2015 12:26 PM  % Wound base Black 5% 09/04/2015 12:26 PM  % Wound base Other (Comment) 0% 09/04/2015 12:26 PM  Peri-wound Assessment Intact 09/04/2015 12:26 PM  Wound Length (cm) 9.5 cm 08/29/2015  2:00 PM  Wound Width (cm) 5.8 cm 08/29/2015  2:00 PM  Wound Depth (cm) 0.1 cm 08/29/2015  2:00 PM  Drainage Amount Minimal 09/04/2015 12:26 PM  Drainage Description Purulent 09/04/2015 12:26 PM  Treatment Debridement (Selective);Hydrotherapy (Pulse lavage);Packing (Saline gauze) 09/04/2015 12:26 PM   Hydrotherapy Pulsed lavage therapy - wound location: Gluteal cleft Pulsed Lavage with Suction (psi): 12 psi (8-12) Pulsed Lavage with Suction - Normal Saline Used: 1000 mL Pulsed Lavage Tip: Tip with splash shield Selective Debridement Selective Debridement - Location: Gluteal cleft Selective Debridement - Tools Used: Forceps;Scissors Selective Debridement - Tissue Removed: black  necrotic tissue   Wound Assessment and Plan  Wound Therapy - Assess/Plan/Recommendations Wound Therapy - Clinical Statement: Continued slow, steady progress with the removal of necrotic tissue. Wound Therapy - Functional Problem List: Decreased tolerance of OOB, acute pain.  Factors Delaying/Impairing Wound Healing: Altered sensation;Diabetes Mellitus;Immobility;Multiple medical problems Hydrotherapy Plan: Debridement;Dressing change;Patient/family education;Pulsatile lavage with suction Wound Therapy - Frequency: 6X / week Wound Therapy - Follow Up Recommendations: Other (comment) (CIR) Wound Plan: See above  Wound Therapy Goals- Improve the function of patient's integumentary system by progressing the wound(s) through the phases of wound healing (inflammation - proliferation - remodeling) by: Decrease Necrotic Tissue to: 25% Decrease Necrotic Tissue - Progress: Progressing toward goal Increase Granulation Tissue to: 75% Increase Granulation Tissue - Progress: Progressing toward goal  Goals will be updated until maximal potential achieved or discharge criteria met.  Discharge criteria: when goals achieved, discharge from hospital, MD decision/surgical intervention, no progress towards goals, refusal/missing three consecutive treatments without notification or medical reason.  GP     Frank Hoover 09/04/2015, 12:29 PM [facility] PT [phone]

## 2015-09-04 NOTE — Progress Notes (Addendum)
ANTICOAGULATION CONSULT NOTE - Follow Up Consult  Pharmacy Consult for heparin/Coumadin Indication: atrial fibrillation  No Known Allergies  Patient Measurements: Height: 5\' 6"  (167.6 cm) Weight: 244 lb 3.2 oz (110.768 kg) IBW/kg (Calculated) : 63.8 Heparin Dosing Weight: 94kg  Vital Signs: Temp: 98 F (36.7 C) (07/03 0700) Temp Source: Oral (07/03 0700) BP: 165/87 mmHg (07/03 0957) Pulse Rate: 83 (07/03 0957)  Labs:  Recent Labs  09/02/15 0430 09/02/15 1035 09/02/15 1512 09/03/15 0320 09/04/15 0402 09/04/15 0420  HGB 9.1* 9.6* 9.7* 10.0* 10.0*  --   HCT 28.4* 29.7* 29.9* 31.1* 31.3*  --   PLT 264  --   --  225 202  --   LABPROT 18.8*  --   --  20.7* 27.8*  --   INR 1.57*  --   --  1.78* 2.64*  --   HEPARINUNFRC 0.74* 0.49  --  0.49  --  0.57  CREATININE 2.03*  --   --  2.09* 2.13*  --     Estimated Creatinine Clearance: 41.5 mL/min (by C-G formula based on Cr of 2.13).  . sodium chloride 10 mL/hr at 09/03/15 1930     Assessment: Frank Hoover admitted 08/14/2015 with newly diagnosed AFlutter with RVR (s/p DCCV on 6/20). Pharmacy consulted to dose coumadin. INR up to 6.36 and Vitamin K 5mg  IV given x 1 (6/24).He is noted with recent a lower GI bleed (flex sigmoidoscopy 6/23 w/ biopsy)on heparin and coumadin (spoke with Dr. [facility]). Will keep on the lower end of goal for heparin and coumadin. Heparin level therapeutic on 1800 units/hr.   Coumadin held 6/Frank due to blood in stool, resumed 7/1.   INR rising quickly - he appears very sensitive to Coumadin.  Anticipate INR may rise a bit further tomorrow.  No bleeding noted.  Nausea/vomiting/abdominal pain noted this morning.  Heparin d/c'd since INR therapeutic.  Goal of Therapy:  Heparin level 0.3-0.5 units/ml  INR goal 2-2.5 Monitor platelets by anticoagulation protocol: Yes   Plan:  D/c heparin Hold Coumadin tonight Daily INR.  Uvaldo Rising, BCPS  Clinical Pharmacist Pager [phone]   09/04/2015 11:19 AM

## 2015-09-04 NOTE — Evaluation (Signed)
Occupational Therapy Evaluation Patient Details Name: COLLIE BAMBRICK MRN: [id number] DOB: 02/24/1951 Today's Date: 09/04/2015    History of Present Illness 65 year old male with PMH of GERD, OSA, COPD, admitted with dizziness, tachycardia and leg edema. Dx with acute cardiogenic/septic shock, acute diastolic CHF, A-flutter, intubated 6/13-6/18. Pt underwent TEE with cardioversion 6/20. Developed significant anemia, Hemoccult-positive stool S/P Flex Sig 6/23, suspicious for ischemic colitis.   Clinical Impression   Pt admitted with above. He demonstrates the below listed deficits and will benefit from continued OT to maximize safety and independence with BADLs.  Pt presents to OT with generalized weakness, decreased activity tolerance, impaired cognition, and balance deficits.  He was lethargic during eval (had meds earlier for nausea which may have impacted arousal).  He currently requires total A for ADLs, due to lethargy, and, Min A +2 for functional transfers.  Once arousal/lethargy improves, anticipate he will progress nicely.  Recommend CIR.      Follow Up Recommendations  CIR;Supervision/Assistance - 24 hour    Equipment Recommendations  None recommended by OT    Recommendations for Other Services       Precautions / Restrictions Precautions Precautions: Fall      Mobility Bed Mobility Overal bed mobility: Needs Assistance;+2 for physical assistance Bed Mobility: Supine to Sit     Supine to sit: Mod assist;+2 for physical assistance     General bed mobility comments: Pt required assist to move his LEs off the EOB and assist to lift trunk from bed.  He initially required assist to scoot to EOB, but once more aroused, was able to scoot to EOB with min guard assist   Transfers Overall transfer level: Needs assistance Equipment used: 2 person hand held assist Transfers: Sit to/from Stand;Stand Pivot Transfers Sit to Stand: Min assist;+2 physical assistance Stand pivot  transfers: Min assist;+2 physical assistance       General transfer comment: Assist to move into standing and assist to steady     Balance Overall balance assessment: Needs assistance Sitting-balance support: Feet supported Sitting balance-Leahy Scale: Fair Sitting balance - Comments: min guard assist to sit EOB statically    Standing balance support: Bilateral upper extremity supported Standing balance-Leahy Scale: Poor Standing balance comment: bil UE support for standing                             ADL Overall ADL's : Needs assistance/impaired     Grooming: Wash/dry face;Maximal assistance;Bed level Grooming Details (indicate cue type and reason): Pt requires max A to wash face due to lethargy  Upper Body Bathing: Total assistance;Sitting   Lower Body Bathing: Total assistance;Sit to/from stand   Upper Body Dressing : Total assistance;Sitting   Lower Body Dressing: Total assistance;Sit to/from stand   Toilet Transfer: Minimal assistance;+2 for physical assistance;Stand-pivot;RW;BSC   Toileting- Clothing Manipulation and Hygiene: Total assistance;Sit to/from stand       Functional mobility during ADLs: Minimal assistance;+2 for physical assistance General ADL Comments: Pt limited by fatigue and impaired cognition. Min A for balance      Vision     Perception     Praxis      Pertinent Vitals/Pain Pain Assessment: No/denies pain Faces Pain Scale: Hurts little more Pain Location: buttocks Pain Descriptors / Indicators: Sore Pain Intervention(s): Monitored during session;Repositioned     Hand Dominance     Extremity/Trunk Assessment Upper Extremity Assessment Upper Extremity Assessment: Generalized weakness   Lower Extremity  Assessment Lower Extremity Assessment: Defer to PT evaluation   Cervical / Trunk Assessment Cervical / Trunk Assessment: Normal   Communication Communication Communication: No difficulties   Cognition  Arousal/Alertness: Lethargic Behavior During Therapy: Flat affect Overall Cognitive Status: Impaired/Different from baseline Area of Impairment: Attention;Following commands;Problem solving;Safety/judgement   Current Attention Level: Sustained Memory: Decreased short-term memory Following Commands: Follows one step commands with increased time     Problem Solving: Slow processing;Requires verbal cues;Decreased initiation General Comments: Pt initially lethargic requiring max cues to arouse.  He is slow to respond to questions   General Comments       Exercises       Shoulder Instructions      Home Living Family/patient expects to be discharged to:: Private residence Living Arrangements: Spouse/significant other Available Help at Discharge: Family;Available 24 hours/day Type of Home: Apartment Home Access: Level entry     Home Layout: One level     Bathroom Shower/Tub: Tub only   Biochemist, clinical: Standard     Home Equipment: Environmental consultant - 2 wheels;Shower seat   Additional Comments: per wife (and secondarily by a lethargic pt) pt independent at home, drove, but was on disability.      Prior Functioning/Environment Level of Independence: Independent             OT Diagnosis: Generalized weakness;Cognitive deficits   OT Problem List: Decreased strength;Decreased activity tolerance;Impaired balance (sitting and/or standing);Decreased cognition;Decreased safety awareness;Decreased knowledge of use of DME or AE;Cardiopulmonary status limiting activity   OT Treatment/Interventions: Self-care/ADL training;Therapeutic exercise;DME and/or AE instruction;Energy conservation;Therapeutic activities;Cognitive remediation/compensation;Patient/family education;Balance training    OT Goals(Current goals can be found in the care plan section) Acute Rehab OT Goals Patient Stated Goal: did not state  OT Goal Formulation: With patient Time For Goal Achievement: 09/18/15 Potential to  Achieve Goals: Good ADL Goals Pt Will Perform Grooming: with min guard assist;standing Pt Will Perform Upper Body Bathing: with set-up;sitting Pt Will Perform Lower Body Bathing: with min assist;sit to/from stand Pt Will Perform Upper Body Dressing: with set-up;sitting Pt Will Perform Lower Body Dressing: with min assist;sit to/from stand Pt Will Transfer to Toilet: with min guard assist;ambulating;regular height toilet;bedside commode;grab bars Pt Will Perform Toileting - Clothing Manipulation and hygiene: with min guard assist;sit to/from stand Pt/caregiver will Perform Home Exercise Program: Increased strength;Both right and left upper extremity;With theraband;With minimal assist  OT Frequency: Min 2X/week   Barriers to D/C:            Co-evaluation PT/OT/SLP Co-Evaluation/Treatment: Yes Reason for Co-Treatment: Complexity of the patient's impairments (multi-system involvement);For patient/therapist safety PT goals addressed during session: Mobility/safety with mobility;Balance;Other (comment) (caregiver education) OT goals addressed during session: ADL's and self-care      End of Session Equipment Utilized During Treatment: Oxygen;Gait belt Nurse Communication: Mobility status  Activity Tolerance: Patient limited by lethargy Patient left: in chair;with call bell/phone within reach   Time: 1141-1159 OT Time Calculation (min): 18 min Charges:  OT General Charges $OT Visit: 1 Procedure OT Evaluation $OT Eval Moderate Complexity: 1 Procedure G-Codes:    Mason Burleigh M 09/08/15, 3:25 PM

## 2015-09-04 NOTE — Progress Notes (Addendum)
CSW engaged with Patient, [name] Tadeusz Nealy [phone]), Patient's daughter, and Patient's family friend Simon Rhein [phone]) at Patient's bedside. CSW discussed the CIR and SNF process. Patient and [name] in agreement that due to transportation issues with the wife, they would prefer for Patient to be closer to Patient's home as opposed to CIR. CSW discussed bed offers with Patient and his family. At this time, Patient and family would like to [facility] but are confident that they would like to go with that facility. [name] requesting that CSW follow up with Patient and family on Wednesday afternoon to solidify SNF placement. Patient and [name] request that we call [facility] if we are unable to reach [name]. CSW will continue to follow.          Emiliano Dyer, LCSW [facility] ED/16M Clinical Social Worker [phone]

## 2015-09-04 NOTE — Progress Notes (Signed)
Patient has refused CPAP for tonight. Patient states he does not want to wear it. Patient in no distress at this time.

## 2015-09-04 NOTE — Progress Notes (Addendum)
PROGRESS NOTE  Frank Hoover  ZMO:[id number] DOB: [birth year] DOA: 08/14/2015 PCP: Alonza Bogus, MD    Brief Narrative:  65 year old male originally seen by his Nephrologist in [place], found to be in A. fib with RVR , admitted to [facility] on 08/15/15.  2-D echo showed EF 25%, and he developed septic and cardiogenic shock, w/ temp 104.  He was transferred to [facility] and treated for bacteremia due to UTI, requiring pressor support.  He required intubation, and was successfully extubated on 6/19.  He was transferred to [facility] on 6/20.   Patient was seen by Cardiology and EP for atrial flutter and SVT.  He converted to NSR after TEE DCCV on 6/20, and unfortunately went back into A. fib with RVR.  While on anticoagulation for atrial fibrillation, he developed bright red blood per rectum.  Flexible sigmoidoscopy was significant for ischemic colitis.  Assessment & Plan:   Acute hypoxic resp failure - With history of OSA, COPD, and cardiogenic/septic shock - resolved w/ sats 100% on RA today  - CPAP QHS for sleep apnea  Septic shock / Citrobacter bacteremia in the setting of UTI - Septic shock resolved - Blood cultures and urine culture growing Citrobacter - completed 2 weeks treatment on 6/28 -  repeat blood cultures 6/22: One bottle out of 2 sets growing micrococcus species, most c/w contamination  Lower GI bleed secondary to ischemic colitis - Secondary to ischemic colitis as confirmed by biopsy, status post flexible sigmoidoscopy on 6/23 - transfused 2 units PRBC on 6/25 - resumed warfarin w/ INR now at goal and Hgb stable   HCAP - has completed tx   Abdom pain w/ N/V May simply be due to severe constipation / early ileus (as suggested on KUB 7/2 - pt reported no BM for "many days") - 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 - no evidence of bleeding - resume diet - bowel regimen - follow   Cardiogenic shock / acute  systolic CHF with EF 03% - clinically stable at this time  - net negative ~16L since admit  - not on ACEI/ARB/spironolactone, aldactone due to ARF [facility] Weights   09/02/15 0333 09/03/15 0157 09/04/15 0333  Weight: 111.857 kg (246 lb 9.6 oz) 111.131 kg (245 lb) 110.768 kg (244 lb 3.2 oz)    Atrial flutter with RVR  - Management per cardiology - status post s/p TEE/DCCV 08/22/15, converted to NSR but then returned to flutter - currently sinus on amio  - was to have ablation today but was cancelled due to GI sx - follow  - continue warfarin  Episode of SVT on 08/23/18 - required carotid massage, IV adenosine, EP following   HTN - Blood pressure climbing - adjust tx and follow   AKI on CKD Stage 3 - Continue to monitor - hold diuresis due to GI sx and poor intake - crt peaked at 5.32  Recent Labs Lab 08/30/15 0300 08/31/15 0245 09/01/15 1238 09/02/15 0430 09/03/15 0320 09/04/15 0402  CREATININE 1.90* 1.82* 1.97* 2.03* 2.09* 2.13*    Anemia  - due to chronic kidney disease, chronic illness, and GI bleed - Hgb has stabilized at ~10 - follow   DM  - CBG currently controlled - follow w/o change today  Urinary retention -foley per Urology - to remain for several weeks to allow urethra to heal per Urology rec  Acute metabolic encephalopathy - appears to have resolved   Unstageable Pressure Injury gluteal cleft  - Wound care following - hydrotherapy  per PT  DVT prophylaxis:  Warfarin   Code Status: Full Code  Family Communication: Spoke w/ wife and daughter at bedside   Disposition Plan:  stable for transfer to tele bed - PT/OT - cont hydrotherapy to gluteal wound   Consultants:  Cards PCCM Urology Inpatient rehabilitation  Procedures:  6/13 R Femoral CVL > 6/13 6/13 ETT > 6/18 6/13 Lt radial aline > 6/17 6/13 RIJ CVL > 6/19 6/25 2 units PRBC transfusion  Subjective: C/o nausea/vomting, and abdom pain this morning, which started last night.  Resolved after having 2-3 bowel movements.  No hematochezia,  hematemesis, or melena.  Denies cp or sob.    Objective: Filed Vitals:   09/04/15 0600 09/04/15 0700 09/04/15 0957 09/04/15 1141  BP: 146/57 169/76 165/87 128/64  Pulse: 73 77 83 67  Temp:  98 F (36.7 C)    TempSrc:  Oral    Resp: 17 20  16   Height:      Weight:      SpO2: 98% 98%  100%    Intake/Output Summary (Last 24 hours) at 09/04/15 1329 Last data filed at 09/04/15 0700  Gross per 24 hour  Intake    331 ml  Output   2030 ml  Net  -1699 ml   Filed Weights   09/02/15 0333 09/03/15 0157 09/04/15 0333  Weight: 111.857 kg (246 lb 9.6 oz) 111.131 kg (245 lb) 110.768 kg (244 lb 3.2 oz)    Examination:  General exam: alert and conversant - pleasant  Respiratory system: distant bs th/o all fields - no wheeze or crackles  Cardiovascular system: Regular - rate controlled - no M  Gastrointestinal system: obese, soft, BS+, no rebound, tender to deep palpation diffusely w/o mass Lower extremity: trace B LE edema w/o cyanosis   Data Reviewed:   CBC:  Recent Labs Lab 08/31/15 0245 09/01/15 0607  09/02/15 0430 09/02/15 1035 09/02/15 1512 09/03/15 0320 09/04/15 0402  WBC 4.8 4.4  --  4.5  --   --  4.2 4.3  HGB 9.4* 9.6*  < > 9.1* 9.6* 9.7* 10.0* 10.0*  HCT 29.3* 30.3*  < > 28.4* 29.7* 29.9* 31.1* 31.3*  MCV 85.2 85.4  --  87.4  --   --  84.7 85.1  PLT 306 287  --  264  --   --  225 202  < > = values in this interval not displayed.   Basic Metabolic Panel:  Recent Labs Lab 08/30/15 0300 08/31/15 0245 09/01/15 1238 09/02/15 0430 09/03/15 0320 09/04/15 0402  NA 134* 134* 134* 132* 133* 141  K 3.4* 4.2 4.9 4.0 3.7 3.3*  CL 104 103 103 100* 100* 96*  CO2 24 25 26 26 23 23   GLUCOSE 170* 81 132* 147* 114* 102*  BUN 22* 23* 25* 27* 23* 23*  CREATININE 1.90* 1.82* 1.97* 2.03* 2.09* 2.13*  CALCIUM 7.9* 8.3* 8.7* 8.7* 9.0 9.9  MG 1.6* 1.9  --   --  1.7 1.7   GFR: Estimated Creatinine Clearance: 41.5 mL/min (by C-G formula based on Cr of 2.13).   Liver Function  Tests:  Recent Labs Lab 08/30/15 0300  AST 21  ALT 24  ALKPHOS 45  BILITOT 0.4  PROT 4.8*  ALBUMIN 1.9*   Coagulation Profile:  Recent Labs Lab 08/31/15 0245 09/01/15 0607 09/02/15 0430 09/03/15 0320 09/04/15 0402  INR 1.50* 1.56* 1.57* 1.78* 2.64*   CBG:  Recent Labs Lab 09/03/15 1134 09/03/15 1618 09/03/15 2055 09/04/15 0736 09/04/15 1146  GLUCAP 180* 107* 113* 124* 177*   Urine analysis:    Component Value Date/Time   COLORURINE AMBER* 08/14/2015 2125   APPEARANCEUR HAZY* 08/14/2015 2125   LABSPEC 1.015 08/14/2015 2125   PHURINE 5.0 08/14/2015 2125   GLUCOSEU 100* 08/14/2015 2125   HGBUR LARGE* 08/14/2015 2125   BILIRUBINUR NEGATIVE 08/14/2015 2125   KETONESUR NEGATIVE 08/14/2015 2125   PROTEINUR 100* 08/14/2015 2125   UROBILINOGEN 0.2 12/02/2013 1739   NITRITE NEGATIVE 08/14/2015 2125   LEUKOCYTESUR MODERATE* 08/14/2015 2125      Anti-infectives    Start     Dose/Rate Route Frequency Ordered Stop   08/27/15 1100  vancomycin (VANCOCIN) 1,250 mg in sodium chloride 0.9 % 250 mL IVPB  Status:  Discontinued     1,250 mg 166.7 mL/hr over 90 Minutes Intravenous Every 24 hours 08/27/15 0842 08/28/15 1125   08/26/15 0130  vancomycin (VANCOCIN) 1,500 mg in sodium chloride 0.9 % 500 mL IVPB  Status:  Discontinued     1,500 mg 250 mL/hr over 120 Minutes Intravenous Every 48 hours 08/26/15 0121 08/27/15 0841   08/24/15 1200  cefTRIAXone (ROCEPHIN) 2 g in dextrose 5 % 50 mL IVPB     2 g 100 mL/hr over 30 Minutes Intravenous Every 24 hours 08/24/15 1055 08/30/15 1249   08/20/15 0000  cefTRIAXone (ROCEPHIN) 2 g in dextrose 5 % 50 mL IVPB  Status:  Discontinued     2 g 100 mL/hr over 30 Minutes Intravenous Every 24 hours 08/19/15 1112 08/22/15 1310   08/18/15 1100  cefTAZidime (FORTAZ) 2 g in dextrose 5 % 50 mL IVPB  Status:  Discontinued     2 g 100 mL/hr over 30 Minutes Intravenous Every 24 hours 08/18/15 1039 08/19/15 1112   08/17/15 1800  vancomycin  (VANCOCIN) 1,500 mg in sodium chloride 0.9 % 500 mL IVPB  Status:  Discontinued     1,500 mg 250 mL/hr over 120 Minutes Intravenous Every 48 hours 08/15/15 1636 08/15/15 1956   08/16/15 2100  ceFEPIme (MAXIPIME) 1 g in dextrose 5 % 50 mL IVPB  Status:  Discontinued     1 g 100 mL/hr over 30 Minutes Intravenous Every 24 hours 08/15/15 1957 08/16/15 1415   08/16/15 2100  ceFEPIme (MAXIPIME) 2 g in dextrose 5 % 50 mL IVPB  Status:  Discontinued     2 g 100 mL/hr over 30 Minutes Intravenous Every 24 hours 08/16/15 1415 08/16/15 1741   08/16/15 1900  piperacillin-tazobactam (ZOSYN) IVPB 2.25 g  Status:  Discontinued     2.25 g 100 mL/hr over 30 Minutes Intravenous Every 8 hours 08/16/15 1809 08/18/15 1014   08/15/15 2100  ceFEPIme (MAXIPIME) 2 g in dextrose 5 % 50 mL IVPB     2 g 100 mL/hr over 30 Minutes Intravenous  Once 08/15/15 1957 08/15/15 2359   08/15/15 1730  vancomycin (VANCOCIN) 2,000 mg in sodium chloride 0.9 % 500 mL IVPB     2,000 mg 250 mL/hr over 120 Minutes Intravenous  Once 08/15/15 1636 08/15/15 2000   08/15/15 0900  piperacillin-tazobactam (ZOSYN) IVPB 3.375 g  Status:  Discontinued     3.375 g 12.5 mL/hr over 240 Minutes Intravenous Every 8 hours 08/15/15 0856 08/15/15 1956   08/15/15 0100  vancomycin (VANCOCIN) IVPB 1000 mg/200 mL premix     1,000 mg 200 mL/hr over 60 Minutes Intravenous STAT 08/15/15 0048 08/15/15 0206   08/14/15 2330  vancomycin (VANCOCIN) IVPB 1000 mg/200 mL premix     1,000  mg 200 mL/hr over 60 Minutes Intravenous STAT 08/14/15 2316 08/15/15 0041   08/14/15 2330  piperacillin-tazobactam (ZOSYN) IVPB 3.375 g     3.375 g 12.5 mL/hr over 240 Minutes Intravenous STAT 08/14/15 2317 08/15/15 0341     Scheduled Meds: . acyclovir ointment   Topical 6 X Daily  . amiodarone  200 mg Oral Daily  . collagenase   Topical Daily  . dextromethorphan-guaiFENesin  1 tablet Oral BID  . feeding supplement (ENSURE ENLIVE)  237 mL Oral TID BM  . feeding supplement  (PRO-STAT SUGAR FREE 64)  30 mL Oral TID BM  . furosemide  40 mg Oral Daily  . hydrALAZINE  75 mg Oral Q8H  . insulin aspart  0-15 Units Subcutaneous TID WC  . insulin glargine  34 Units Subcutaneous Daily  . isosorbide mononitrate  60 mg Oral Daily  . metoprolol tartrate  100 mg Oral BID  . multivitamin with minerals  1 tablet Oral Daily  . oxybutynin  5 mg Oral TID  . pantoprazole  40 mg Oral Daily  . prochlorperazine  5 mg Intravenous Once  . Warfarin - Pharmacist Dosing Inpatient   Does not apply q1800     LOS: 21 days   Time spent: 10 min  Cherene Altes, MD [facility] Office  [phone] Pager - Text Page per Amion as per below:  On-Call/Text Page:      [email or web address]      password [email or web address]  If 7PM-7AM, please contact night-coverage [email or web address] Password [email or web address] 09/04/2015, 1:29 PM

## 2015-09-04 NOTE — Progress Notes (Signed)
ANTICOAGULATION CONSULT NOTE - Follow Up Consult  Pharmacy Consult for Heparin Indication: atrial fibrillation  No Known Allergies  Patient Measurements: Height: 5\' 6"  (167.6 cm) Weight: 244 lb 3.2 oz (110.768 kg) IBW/kg (Calculated) : 63.8 Heparin Dosing Weight: 94kg  Vital Signs: Temp: 98 F (36.7 C) (07/03 0400) Temp Source: Oral (07/03 0400) BP: 166/74 mmHg (07/03 0400) Pulse Rate: 78 (07/03 0400)  Labs:  Recent Labs  09/01/15 0607 09/01/15 1238  09/02/15 0430 09/02/15 1035 09/02/15 1512 09/03/15 0320 09/04/15 0402 09/04/15 0420  HGB 9.6*  --   < > 9.1* 9.6* 9.7* 10.0* 10.0*  --   HCT 30.3*  --   < > 28.4* 29.7* 29.9* 31.1* 31.3*  --   PLT 287  --   --  264  --   --  225 202  --   LABPROT 18.7*  --   --  18.8*  --   --  20.7*  --   --   INR 1.56*  --   --  1.57*  --   --  1.78*  --   --   HEPARINUNFRC 0.39  --   --  0.74* 0.49  --  0.49  --  0.57  CREATININE  --  1.97*  --  2.03*  --   --  2.09*  --   --   < > = values in this interval not displayed.  Estimated Creatinine Clearance: 42.3 mL/min (by C-G formula based on Cr of 2.09).  . sodium chloride 10 mL/hr at 09/03/15 1930  . heparin 1,800 Units/hr (09/03/15 1900)   Assessment: 56 YOM admitted 08/14/2015 with newly diagnosed AFlutter with RVR (s/p DCCV on 6/20). Pharmacy consulted to dose coumadin. INR up to 6.36 and Vitamin K 5mg  IV given x 1 (6/24).He is noted with recent a lower GI bleed (flex sigmoidoscopy 6/23 w/ biopsy) on heparin and coumadin. Will keep on the lower end of goal for heparin and coumadin. Heparin level supratherapeutic for low goal on 1800 units/hr. No bleeding noted.  Goal of Therapy:  Heparin level 0.3-0.5 units/ml  INR goal 2-2.5 Monitor platelets by anticoagulation protocol: Yes   Plan:  Decrease heparin to 1650 units/hr F/u 6 hr heparin level F/u INR  Sherlon Handing, PharmD, BCPS Clinical pharmacist, pager [phone]  09/04/2015 5:15 AM

## 2015-09-04 NOTE — Progress Notes (Signed)
Patient did not eat any of his dinner and declined offer to warm it up. Ginger ale given along with Zofran. Will continue to monitor call light within reach

## 2015-09-04 NOTE — Progress Notes (Deleted)
Physical Therapy Treatment & Discharge Patient Details Name: Frank Hoover MRN: [id number] DOB: August 27, 1950 Today's Date: 09/04/2015    History of Present Illness 65 year old male with PMH of GERD, OSA, COPD, admitted with dizziness, tachycardia and leg edema. Dx with acute cardiogenic/septic shock, acute diastolic CHF, A-flutter, intubated 6/13-6/18. Pt underwent TEE with cardioversion 6/20. Developed significant anemia, Hemoccult-positive stool S/P Flex Sig 6/23, suspicious for ischemic colitis.    PT Comments    Patient benefited this session for standing and OOB briefly, though, per palliative care practitioner, family has opted for pt to go home with Hospice services.  Feel wife and daughters understand level of care/assist pt will need for any mobility at home.  Will d/c PT services at this time.   Follow Up Recommendations  No PT follow up     Equipment Recommendations  3in1 (PT)    Recommendations for Other Services       Precautions / Restrictions Precautions Precautions: Fall    Mobility  Bed Mobility Overal bed mobility: Needs Assistance;+ 2 for safety/equipment       Supine to sit: Mod assist;+2 for safety/equipment Sit to supine: +2 for safety/equipment;Min assist   General bed mobility comments: assist for legs off bed and to lift trunk with pt pulling up on therapists hand  Transfers Overall transfer level: Needs assistance Equipment used: 2 person hand held assist Transfers: Sit to/from Stand Sit to Stand: Min assist;+2 physical assistance         General transfer comment: asssit up from EOB and stood about 45 seconds, family presents and encouraging him and able to view how to assist if pt able to perfom at home  Ambulation/Gait                 Stairs            Wheelchair Mobility    Modified Rankin (Stroke Patients Only)       Balance Overall balance assessment: Needs assistance   Sitting balance-Leahy Scale: Good      Standing balance support: Bilateral upper extremity supported Standing balance-Leahy Scale: Poor Standing balance comment: HHA bilat for balance                    Cognition Arousal/Alertness: Awake/alert Behavior During Therapy: Anxious Overall Cognitive Status: Impaired/Different from baseline Area of Impairment: Memory;Attention   Current Attention Level: Sustained Memory: Decreased short-term memory Following Commands: Follows one step commands with increased time;Follows multi-step commands with increased time       General Comments: perseverated on needing to urnate despite redirection and reminders he has a catheter; ready to go home today despite family reminding him will be tomorrow    Exercises      General Comments General comments (skin integrity, edema, etc.): BP 100/68 after standing (in sitting position)      Pertinent Vitals/Pain Faces Pain Scale: Hurts little more Pain Location: abdomen Pain Descriptors / Indicators: Discomfort Pain Intervention(s): Repositioned;Monitored during session    Home Living                      Prior Function            PT Goals (current goals can now be found in the care plan section) Progress towards PT goals: Goals met/education completed, patient discharged from PT (goals not met, but pt d/c from PT)    Frequency       PT Plan Other (comment);Discharge plan needs  to be updated;Equipment recommendations need to be updated (d/c PT as plan for home with Hospice tomorrow.)    Co-evaluation PT/OT/SLP Co-Evaluation/Treatment: Yes Reason for Co-Treatment: Complexity of the patient's impairments (multi-system involvement);For patient/therapist safety PT goals addressed during session: Mobility/safety with mobility;Balance;Other (comment) (caregiver education)       End of Session Equipment Utilized During Treatment: Gait belt Activity Tolerance: Patient tolerated treatment well Patient left: in  bed;with call bell/phone within reach;with family/visitor present;with bed alarm set     Time: 1143-1200 PT Time Calculation (min) (ACUTE ONLY): 17 min  Charges:  $Therapeutic Activity: 8-22 mins                    G Codes:      Reginia Naas 15-Sep-2015, 2:55 PM  Magda Kiel, [place] 09-15-15

## 2015-09-05 DIAGNOSIS — L8990 Pressure ulcer of unspecified site, unspecified stage: Secondary | ICD-10-CM | POA: Insufficient documentation

## 2015-09-05 LAB — CBC
HCT: 32.6 % — ABNORMAL LOW (ref 39.0–52.0)
Hemoglobin: 10.4 g/dL — ABNORMAL LOW (ref 13.0–17.0)
MCH: 27.2 pg (ref 26.0–34.0)
MCHC: 31.9 g/dL (ref 30.0–36.0)
MCV: 85.1 fL (ref 78.0–100.0)
Platelets: 207 10*3/uL (ref 150–400)
RBC: 3.83 MIL/uL — ABNORMAL LOW (ref 4.22–5.81)
RDW: 17.6 % — ABNORMAL HIGH (ref 11.5–15.5)
WBC: 3.6 10*3/uL — ABNORMAL LOW (ref 4.0–10.5)

## 2015-09-05 LAB — BASIC METABOLIC PANEL
Anion gap: 8 (ref 5–15)
BUN: 23 mg/dL — ABNORMAL HIGH (ref 6–20)
CO2: 25 mmol/L (ref 22–32)
Calcium: 9 mg/dL (ref 8.9–10.3)
Chloride: 104 mmol/L (ref 101–111)
Creatinine, Ser: 1.97 mg/dL — ABNORMAL HIGH (ref 0.61–1.24)
GFR calc Af Amer: 40 mL/min — ABNORMAL LOW (ref >60)
GFR calc non Af Amer: 34 mL/min — ABNORMAL LOW (ref >60)
Glucose, Bld: 111 mg/dL — ABNORMAL HIGH (ref 65–99)
Potassium: 3.7 mmol/L (ref 3.5–5.1)
Sodium: 137 mmol/L (ref 135–145)

## 2015-09-05 LAB — GLUCOSE, CAPILLARY
Glucose-Capillary: 120 mg/dL — ABNORMAL HIGH (ref 65–99)
Glucose-Capillary: 160 mg/dL — ABNORMAL HIGH (ref 65–99)
Glucose-Capillary: 76 mg/dL (ref 65–99)
Glucose-Capillary: 71 mg/dL (ref 65–99)

## 2015-09-05 LAB — LACTATE DEHYDROGENASE: LDH: 153 U/L (ref 98–192)

## 2015-09-05 LAB — PROTIME-INR
INR: 4.48 (ref 0.00–1.49)
Prothrombin Time: 41.4 seconds — ABNORMAL HIGH (ref 11.6–15.2)

## 2015-09-05 MED ORDER — ACETAMINOPHEN 325 MG PO TABS
650.0000 mg | ORAL_TABLET | Freq: Four times a day (QID) | ORAL | Status: DC | PRN
  Administered 2015-09-05: 650 mg via ORAL
  Filled 2015-09-05: qty 2

## 2015-09-05 NOTE — Progress Notes (Signed)
Physical Therapy Wound Treatment Patient Details  Name: Frank Hoover MRN: [id number] Date of Birth: Jul 17, 1950  Today's Date: 09/05/2015 Time: 0927-1003 Time Calculation (min): 36 min  Subjective  Subjective: Pt pleasant and agreeable to therapy Patient and Family Stated Goals: Heal wound Date of Onset:  (Unknown)  Pain Score:  Pt reports minimal pain throughout session.   Wound Assessment  Pressure Ulcer 08/29/15 Unstageable - Full thickness tissue loss in which the base of the ulcer is covered by slough (yellow, tan, gray, green or brown) and/or eschar (tan, brown or black) in the wound bed. 100% yellow-per staff started as blister (Active)  Dressing Type ABD;Barrier Film (skin prep);Gauze (Comment);Moist to dry 09/05/2015 10:00 AM  Dressing Changed;Clean;Dry;Intact 09/05/2015 10:00 AM  Dressing Change Frequency Daily 09/05/2015 10:00 AM  State of Healing Eschar 09/05/2015 10:00 AM  Site / Wound Assessment Yellow;Pink;Black 09/05/2015 10:00 AM  % Wound base Red or Granulating 30% 09/05/2015 10:00 AM  % Wound base Yellow 65% 09/05/2015 10:00 AM  % Wound base Black 5% 09/05/2015 10:00 AM  % Wound base Other (Comment) 0% 09/05/2015 10:00 AM  Peri-wound Assessment Intact 09/05/2015 10:00 AM  Wound Length (cm) 9.5 cm 08/29/2015  2:00 PM  Wound Width (cm) 5.8 cm 08/29/2015  2:00 PM  Wound Depth (cm) 0.1 cm 08/29/2015  2:00 PM  Drainage Amount Minimal 09/05/2015 10:00 AM  Drainage Description Purulent 09/05/2015 10:00 AM  Treatment Debridement (Selective);Hydrotherapy (Pulse lavage);Packing (Saline gauze) 09/05/2015 10:00 AM  Santyl applied to wound bed prior to applying dressing.   Hydrotherapy Pulsed lavage therapy - wound location: Gluteal cleft Pulsed Lavage with Suction (psi): 12 psi Pulsed Lavage with Suction - Normal Saline Used: 1000 mL Pulsed Lavage Tip: Tip with splash shield Selective Debridement Selective Debridement - Location: Gluteal cleft Selective Debridement - Tools Used:  Forceps;Scissors Selective Debridement - Tissue Removed: black and yellow necrotic tissue   Wound Assessment and Plan  Wound Therapy - Assess/Plan/Recommendations Wound Therapy - Clinical Statement: Continued slow, steady progress with the removal of necrotic tissue. Wound Therapy - Functional Problem List: Decreased tolerance of OOB, acute pain.  Factors Delaying/Impairing Wound Healing: Altered sensation;Diabetes Mellitus;Immobility;Multiple medical problems Hydrotherapy Plan: Debridement;Dressing change;Patient/family education;Pulsatile lavage with suction Wound Therapy - Frequency: 6X / week Wound Therapy - Follow Up Recommendations: Other (comment) (CIR) Wound Plan: See above  Wound Therapy Goals- Improve the function of patient's integumentary system by progressing the wound(s) through the phases of wound healing (inflammation - proliferation - remodeling) by: Decrease Necrotic Tissue to: 25% Decrease Necrotic Tissue - Progress: Progressing toward goal Increase Granulation Tissue to: 75% Increase Granulation Tissue - Progress: Progressing toward goal Goals/treatment plan/discharge plan were made with and agreed upon by patient/family: Yes Time For Goal Achievement: 7 days Wound Therapy - Potential for Goals: Good  Goals will be updated until maximal potential achieved or discharge criteria met.  Discharge criteria: when goals achieved, discharge from hospital, MD decision/surgical intervention, no progress towards goals, refusal/missing three consecutive treatments without notification or medical reason.  GP     Rolinda Roan 09/05/2015, 10:23 AM   Rolinda Roan, PT, DPT Acute Rehabilitation Services Pager: [phone]

## 2015-09-05 NOTE — Progress Notes (Signed)
ANTICOAGULATION CONSULT NOTE - Follow Up Consult  Pharmacy Consult for heparin/Coumadin Indication: atrial fibrillation  No Known Allergies  Patient Measurements: Height: 5\' 6"  (167.6 cm) Weight: 235 lb 8 oz (106.822 kg) IBW/kg (Calculated) : 63.8 Heparin Dosing Weight: 94kg  Vital Signs: Temp: 98.3 F (36.8 C) (07/04 0448) Temp Source: Oral (07/04 0448) BP: 136/71 mmHg (07/04 0448) Pulse Rate: 75 (07/04 0448)  Labs:  Recent Labs  09/02/15 1035  09/03/15 0320 09/04/15 0402 09/04/15 0420 09/05/15 0543  HGB 9.6*  < > 10.0* 10.0*  --  10.4*  HCT 29.7*  < > 31.1* 31.3*  --  32.6*  PLT  --   --  225 202  --  207  LABPROT  --   --  20.7* 27.8*  --  41.4*  INR  --   --  1.78* 2.64*  --  4.48*  HEPARINUNFRC 0.49  --  0.49  --  0.57  --   CREATININE  --   --  2.09* 2.13*  --  1.97*  < > = values in this interval not displayed.  Estimated Creatinine Clearance: 44 mL/min (by C-G formula based on Cr of 1.97).  . sodium chloride Stopped (09/04/15 1400)     Assessment: 27 YOM admitted 08/14/2015 with newly diagnosed AFlutter with RVR (s/p DCCV on 6/20). Pharmacy consulted to dose coumadin. INR up to 6.36 and Vitamin K 5mg  IV given x 1 (6/24).He is noted with recent a lower GI bleed (flex sigmoidoscopy 6/23 w/ biopsy)on  Coumadin. Will keep on the lower end of goal  -INR= 4.48 (trend up). Will need to restart coumadin with a very low dose of coumadin (will consider 1mg )  Goal of Therapy:  INR goal 2-2.5 Monitor platelets by anticoagulation protocol: Yes   Plan:  Hold Coumadin tonight Daily INR.  Hildred Laser, Pharm D 09/05/2015 8:15 AM

## 2015-09-05 NOTE — Progress Notes (Signed)
PROGRESS NOTE  Frank Hoover  ZWC:[id number] DOB: 1950/09/22 DOA: 08/14/2015 PCP: Alonza Bogus, MD    Brief Narrative:  65 year old male originally seen by his Nephrologist in [place], found to be in A. fib with RVR , admitted to [facility] on 08/15/15.  2-D echo showed EF 25%, and he developed septic and cardiogenic shock, w/ temp 104.  He was transferred to [facility] and treated for bacteremia due to UTI, requiring pressor support.  He required intubation, and was successfully extubated on 6/19.  He was transferred to [facility] on 6/20.   Patient was seen by Cardiology and EP for atrial flutter and SVT.  He converted to NSR after TEE DCCV on 6/20, and unfortunately went back into A. fib with RVR.  While on anticoagulation for atrial fibrillation, he developed bright red blood per rectum.  Flexible sigmoidoscopy was significant for ischemic colitis. -Bleeding resolved 7/3: Nausea and Vomiting-ileus 7/4: Transferred out of SDU to [facility] (Team 9)  Assessment & Plan:   Acute hypoxic resp failure - With history of OSA, COPD, and cardiogenic/septic shock - resolved w/ sats 100% on RA today  - CPAP QHS for sleep apnea  Septic shock / Citrobacter bacteremia in the setting of UTI - Septic shock resolved - Blood cultures and urine culture growing Citrobacter - completed 2 weeks treatment on 6/28 -  repeat blood cultures 6/22: One bottle out of 2 sets growing micrococcus species, most c/w contamination  Lower GI bleed secondary to ischemic colitis - Secondary to ischemic colitis as confirmed by biopsy, status post flexible sigmoidoscopy on 6/23 - transfused 2 units PRBC on 6/25 - resumed warfarin, INR supratherapeutic now - Hb stable, no further bleeding   HCAP - has completed tx   Abdom pain w/ N/V -suspect constipation / early ileus (as suggested on KUB 7/2 - pt reported no BM for "many days") - 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 - no evidence of bleeding -  -continue  laxatives, no further vomiting, some nausea, BM +-last night -supportive care, diet as tolerated   Cardiogenic shock / acute  systolic CHF with EF 23% - clinically stable at this time - net negative ~16L since admit  - not on ACEI/ARB/spironolactone, aldactone due to ARF - EUvolemic, off diuretics at this time, cards following Filed Weights   09/03/15 0157 09/04/15 0333 09/05/15 0600  Weight: 111.131 kg (245 lb) 110.768 kg (244 lb 3.2 oz) 106.822 kg (235 lb 8 oz)    Atrial flutter with RVR  - Management per cardiology - status post s/p TEE/DCCV 08/22/15, converted to NSR but then returned to flutter  - was to have ablation 7/3 but was cancelled due to GI sx - follow  - continue warfarin - now in NSR, on amiodarone  Episode of SVT on 08/23/18 - required carotid massage, IV adenosine, EP following  -continue amiodarone  HTN - stable, monitor  AKI on CKD Stage 3 - Continue to monitor - hold diuresis due to GI sx and poor intake - crt peaked at 5.32  Recent Labs Lab 08/31/15 0245 09/01/15 1238 09/02/15 0430 09/03/15 0320 09/04/15 0402 09/05/15 0543  CREATININE 1.82* 1.97* 2.03* 2.09* 2.13* 1.97*    Anemia  - due to chronic kidney disease, chronic illness, and GI bleed - Hgb has stabilized at ~10 - follow   DM  - CBG currently controlled - follow w/o change today  Urinary retention -foley per Urology - to remain for several weeks to allow urethra to heal per  Urology recommendations, DC to Rehab with foley  Acute metabolic encephalopathy - appears to have resolved   Unstageable Pressure Injury gluteal cleft  - Wound care following - hydrotherapy per PT  DVT prophylaxis:  Warfarin   Code Status: Full Code  Family Communication: None at bedside today  Disposition Plan:  Rehab soon if GI symptoms resolved and timing of ablation per EP  Consultants:  Cards PCCM Urology Inpatient rehabilitation  Procedures:  6/13 R Femoral CVL > 6/13 6/13 ETT >  6/18 6/13 Lt radial aline > 6/17 6/13 RIJ CVL > 6/19 6/25 2 units PRBC transfusion  Subjective: Some nausea, no further vomiting, had BM last night  Objective: Filed Vitals:   09/04/15 2137 09/05/15 0448 09/05/15 0600 09/05/15 1038  BP: 144/62 136/71  186/87  Pulse: 76 75  71  Temp: 98.2 F (36.8 C) 98.3 F (36.8 C)    TempSrc: Oral Oral    Resp: 18 17    Height:      Weight:   106.822 kg (235 lb 8 oz)   SpO2: 98% 98%      Intake/Output Summary (Last 24 hours) at 09/05/15 1217 Last data filed at 09/05/15 0448  Gross per 24 hour  Intake     30 ml  Output   1900 ml  Net  -1870 ml   Filed Weights   09/03/15 0157 09/04/15 0333 09/05/15 0600  Weight: 111.131 kg (245 lb) 110.768 kg (244 lb 3.2 oz) 106.822 kg (235 lb 8 oz)    Examination:  General exam: alert and conversant - pleasant, no distress Respiratory system: distant bs th/o all fields - no wheeze or crackles  Cardiovascular system: Regular - rate controlled - no M  Gastrointestinal system: obese, soft, BS+, non tender Lower extremity: trace B LE edema w/o cyanosis   Data Reviewed:   CBC:  Recent Labs Lab 09/01/15 0607  09/02/15 0430 09/02/15 1035 09/02/15 1512 09/03/15 0320 09/04/15 0402 09/05/15 0543  WBC 4.4  --  4.5  --   --  4.2 4.3 3.6*  HGB 9.6*  < > 9.1* 9.6* 9.7* 10.0* 10.0* 10.4*  HCT 30.3*  < > 28.4* 29.7* 29.9* 31.1* 31.3* 32.6*  MCV 85.4  --  87.4  --   --  84.7 85.1 85.1  PLT 287  --  264  --   --  225 202 207  < > = values in this interval not displayed.   Basic Metabolic Panel:  Recent Labs Lab 08/30/15 0300 08/31/15 0245 09/01/15 1238 09/02/15 0430 09/03/15 0320 09/04/15 0402 09/05/15 0543  NA 134* 134* 134* 132* 133* 141 137  K 3.4* 4.2 4.9 4.0 3.7 3.3* 3.7  CL 104 103 103 100* 100* 96* 104  CO2 24 25 26 26 23 23 25   GLUCOSE 170* 81 132* 147* 114* 102* 111*  BUN 22* 23* 25* 27* 23* 23* 23*  CREATININE 1.90* 1.82* 1.97* 2.03* 2.09* 2.13* 1.97*  CALCIUM 7.9* 8.3* 8.7*  8.7* 9.0 9.9 9.0  MG 1.6* 1.9  --   --  1.7 1.7  --    GFR: Estimated Creatinine Clearance: 44 mL/min (by C-G formula based on Cr of 1.97).   Liver Function Tests:  Recent Labs Lab 08/30/15 0300  AST 21  ALT 24  ALKPHOS 45  BILITOT 0.4  PROT 4.8*  ALBUMIN 1.9*   Coagulation Profile:  Recent Labs Lab 09/01/15 0607 09/02/15 0430 09/03/15 0320 09/04/15 0402 09/05/15 0543  INR 1.56* 1.57* 1.78* 2.64* 4.48*  CBG:  Recent Labs Lab 09/04/15 1146 09/04/15 1633 09/04/15 2119 09/05/15 0558 09/05/15 1101  GLUCAP 177* 132* 141* 120* 160*   Urine analysis:    Component Value Date/Time   COLORURINE AMBER* 08/14/2015 2125   APPEARANCEUR HAZY* 08/14/2015 2125   LABSPEC 1.015 08/14/2015 2125   PHURINE 5.0 08/14/2015 2125   GLUCOSEU 100* 08/14/2015 2125   HGBUR LARGE* 08/14/2015 2125   BILIRUBINUR NEGATIVE 08/14/2015 2125   KETONESUR NEGATIVE 08/14/2015 2125   PROTEINUR 100* 08/14/2015 2125   UROBILINOGEN 0.2 12/02/2013 1739   NITRITE NEGATIVE 08/14/2015 2125   LEUKOCYTESUR MODERATE* 08/14/2015 2125      Anti-infectives    Start     Dose/Rate Route Frequency Ordered Stop   08/27/15 1100  vancomycin (VANCOCIN) 1,250 mg in sodium chloride 0.9 % 250 mL IVPB  Status:  Discontinued     1,250 mg 166.7 mL/hr over 90 Minutes Intravenous Every 24 hours 08/27/15 0842 08/28/15 1125   08/26/15 0130  vancomycin (VANCOCIN) 1,500 mg in sodium chloride 0.9 % 500 mL IVPB  Status:  Discontinued     1,500 mg 250 mL/hr over 120 Minutes Intravenous Every 48 hours 08/26/15 0121 08/27/15 0841   08/24/15 1200  cefTRIAXone (ROCEPHIN) 2 g in dextrose 5 % 50 mL IVPB     2 g 100 mL/hr over 30 Minutes Intravenous Every 24 hours 08/24/15 1055 08/30/15 1249   08/20/15 0000  cefTRIAXone (ROCEPHIN) 2 g in dextrose 5 % 50 mL IVPB  Status:  Discontinued     2 g 100 mL/hr over 30 Minutes Intravenous Every 24 hours 08/19/15 1112 08/22/15 1310   08/18/15 1100  cefTAZidime (FORTAZ) 2 g in dextrose 5  % 50 mL IVPB  Status:  Discontinued     2 g 100 mL/hr over 30 Minutes Intravenous Every 24 hours 08/18/15 1039 08/19/15 1112   08/17/15 1800  vancomycin (VANCOCIN) 1,500 mg in sodium chloride 0.9 % 500 mL IVPB  Status:  Discontinued     1,500 mg 250 mL/hr over 120 Minutes Intravenous Every 48 hours 08/15/15 1636 08/15/15 1956   08/16/15 2100  ceFEPIme (MAXIPIME) 1 g in dextrose 5 % 50 mL IVPB  Status:  Discontinued     1 g 100 mL/hr over 30 Minutes Intravenous Every 24 hours 08/15/15 1957 08/16/15 1415   08/16/15 2100  ceFEPIme (MAXIPIME) 2 g in dextrose 5 % 50 mL IVPB  Status:  Discontinued     2 g 100 mL/hr over 30 Minutes Intravenous Every 24 hours 08/16/15 1415 08/16/15 1741   08/16/15 1900  piperacillin-tazobactam (ZOSYN) IVPB 2.25 g  Status:  Discontinued     2.25 g 100 mL/hr over 30 Minutes Intravenous Every 8 hours 08/16/15 1809 08/18/15 1014   08/15/15 2100  ceFEPIme (MAXIPIME) 2 g in dextrose 5 % 50 mL IVPB     2 g 100 mL/hr over 30 Minutes Intravenous  Once 08/15/15 1957 08/15/15 2359   08/15/15 1730  vancomycin (VANCOCIN) 2,000 mg in sodium chloride 0.9 % 500 mL IVPB     2,000 mg 250 mL/hr over 120 Minutes Intravenous  Once 08/15/15 1636 08/15/15 2000   08/15/15 0900  piperacillin-tazobactam (ZOSYN) IVPB 3.375 g  Status:  Discontinued     3.375 g 12.5 mL/hr over 240 Minutes Intravenous Every 8 hours 08/15/15 0856 08/15/15 1956   08/15/15 0100  vancomycin (VANCOCIN) IVPB 1000 mg/200 mL premix     1,000 mg 200 mL/hr over 60 Minutes Intravenous STAT 08/15/15 0048 08/15/15 0206  08/14/15 2330  vancomycin (VANCOCIN) IVPB 1000 mg/200 mL premix     1,000 mg 200 mL/hr over 60 Minutes Intravenous STAT 08/14/15 2316 08/15/15 0041   08/14/15 2330  piperacillin-tazobactam (ZOSYN) IVPB 3.375 g     3.375 g 12.5 mL/hr over 240 Minutes Intravenous STAT 08/14/15 2317 08/15/15 0341     Scheduled Meds: . acyclovir ointment   Topical 6 X Daily  . amiodarone  200 mg Oral Daily  .  collagenase   Topical Daily  . feeding supplement (ENSURE ENLIVE)  237 mL Oral TID BM  . feeding supplement (PRO-STAT SUGAR FREE 64)  30 mL Oral TID BM  . hydrALAZINE  75 mg Oral Q8H  . insulin aspart  0-15 Units Subcutaneous TID WC  . insulin glargine  34 Units Subcutaneous Daily  . isosorbide mononitrate  60 mg Oral Daily  . metoprolol tartrate  100 mg Oral BID  . multivitamin with minerals  1 tablet Oral Daily  . oxybutynin  5 mg Oral TID  . pantoprazole  40 mg Oral Daily  . polyethylene glycol  17 g Oral BID  . senna-docusate  1 tablet Oral BID  . Warfarin - Pharmacist Dosing Inpatient   Does not apply q1800     LOS: 22 days   Time spent: 35 min  Domenic Polite, MD [facility] [phone]  On-Call/Text Page:      [email or web address]      password [email or web address]  If 7PM-7AM, please contact night-coverage [email or web address] Password [email or web address] 09/05/2015, 12:17 PM

## 2015-09-05 NOTE — Progress Notes (Signed)
Consulting cardiologist: Dr. Satira Sark  Seen for followup: Atrial flutter, cardiomyopathy  Subjective:    Still with intermittent nausea and emesis, mainly post prandial. No palpitations or chest pain.  Objective:   Temp:  [98 F (36.7 C)-99.4 F (37.4 C)] 98.3 F (36.8 C) (07/04 0448) Pulse Rate:  [67-88] 75 (07/04 0448) Resp:  [16-19] 17 (07/04 0448) BP: (128-165)/(62-87) 136/71 mmHg (07/04 0448) SpO2:  [95 %-100 %] 98 % (07/04 0448) Weight:  [235 lb 8 oz (176.160 kg)] 235 lb 8 oz (106.822 kg) (07/04 0600) Last BM Date: 09/04/15  Filed Weights   09/03/15 0157 09/04/15 0333 09/05/15 0600  Weight: 245 lb (111.131 kg) 244 lb 3.2 oz (110.768 kg) 235 lb 8 oz (106.822 kg)    Intake/Output Summary (Last 24 hours) at 09/05/15 0846 Last data filed at 09/05/15 0448  Gross per 24 hour  Intake    143 ml  Output   1900 ml  Net  -1757 ml    Telemetry: Sinus rhythm.  Exam:  General: Obese male in no distress.  Lungs: Clear with diminished breath sounds  Cardiac: RRR without gallop.  Abdomen: Nontender, bowel sounds present.  Extremities: Mild leg edema.  Lab Results:  Basic Metabolic Panel:  Recent Labs Lab 08/31/15 0245  09/03/15 0320 09/04/15 0402 09/05/15 0543  NA 134*  < > 133* 141 137  K 4.2  < > 3.7 3.3* 3.7  CL 103  < > 100* 96* 104  CO2 25  < > 23 23 25   GLUCOSE 81  < > 114* 102* 111*  BUN 23*  < > 23* 23* 23*  CREATININE 1.82*  < > 2.09* 2.13* 1.97*  CALCIUM 8.3*  < > 9.0 9.9 9.0  MG 1.9  --  1.7 1.7  --   < > = values in this interval not displayed.  Liver Function Tests:  Recent Labs Lab 08/30/15 0300  AST 21  ALT 24  ALKPHOS 45  BILITOT 0.4  PROT 4.8*  ALBUMIN 1.9*    CBC:  Recent Labs Lab 09/03/15 0320 09/04/15 0402 09/05/15 0543  WBC 4.2 4.3 3.6*  HGB 10.0* 10.0* 10.4*  HCT 31.1* 31.3* 32.6*  MCV 84.7 85.1 85.1  PLT 225 202 207    Coagulation:  Recent Labs Lab 09/03/15 0320 09/04/15 0402 09/05/15 0543   INR 1.78* 2.64* 4.48*   Echocardiogram 08/14/2015: Study Conclusions  - Left ventricle: The cavity size was normal. Wall thickness was  increased in a pattern of mild LVH. The estimated ejection  fraction was 25%. Study telemetry shows a regular tachycardia  suggestive of atrial flutter. Diffuse hypokinesis. Probable  akinesis of the inferoseptal myocardium. The study is not  technically sufficient to allow evaluation of LV diastolic  function. - Aortic valve: Mildly calcified annulus. Trileaflet. - Mitral valve: There was trivial regurgitation. - Left atrium: The atrium was mildly dilated. - Right ventricle: Systolic function was moderately reduced. - Right atrium: The atrium was at the upper limits of normal in  size. Central venous pressure (est): 8 mm Hg. - Atrial septum: There was a patent foramen ovale. Bidirectional  shunting noted by color Doppler. - Tricuspid valve: There was mild regurgitation. - Pulmonary arteries: PA peak pressure: 31 mm Hg (S). - Pericardium, extracardiac: There was no pericardial effusion.  Impressions:  - Mild LVH with LVEF approximately 25%, diffuse hypokinesis with  probable akinesis of the inferior septum. Left ventricular  dysfunction is new in comparison to the previous  study from 2015.  Study telemetry looks to show atrial flutter with RVR.  Indeterminate diastolic function. Mild left atrial enlargement.  Trivial mitral regurgitation. Moderately reduced RV contraction.  Mild tricuspid regurgitation with PASP 31 mmHg. 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   Medications:   Scheduled Medications: . acyclovir ointment   Topical 6 X Daily  . amiodarone  200 mg Oral Daily  . collagenase   Topical Daily  . feeding supplement (ENSURE ENLIVE)  237 mL Oral TID BM  . feeding supplement (PRO-STAT SUGAR FREE 64)  30 mL Oral TID BM  . hydrALAZINE  75 mg Oral Q8H  . insulin aspart  0-15 Units Subcutaneous TID WC   . insulin glargine  34 Units Subcutaneous Daily  . isosorbide mononitrate  60 mg Oral Daily  . metoprolol tartrate  100 mg Oral BID  . multivitamin with minerals  1 tablet Oral Daily  . oxybutynin  5 mg Oral TID  . pantoprazole  40 mg Oral Daily  . polyethylene glycol  17 g Oral BID  . potassium chloride  40 mEq Oral BID  . senna-docusate  1 tablet Oral BID  . Warfarin - Pharmacist Dosing Inpatient   Does not apply q1800     Infusions: . sodium chloride Stopped (09/04/15 1400)     PRN Medications:  acetaminophen (TYLENOL) oral liquid 160 mg/5 mL, hydrALAZINE, HYDROcodone-acetaminophen, levalbuterol, loperamide, metoprolol, ondansetron (ZOFRAN) IV, prochlorperazine, sodium chloride flush   Assessment:   1. Atrial flutter (Typical) and short RP SVT s/p DCCV x 2 during hospitalization. Ablation being considered by EP once patient further improved. He is in sinus rhythm now on Amiodarone and Coumadin.  2. GI bleed secondary to ischemic colitis, Hb stable at 10.4.  3. Acute systolic heart failure. Improved. On Lopressor, Hydralazine, and Imdur. No ACE-I or ARB now with renal insufficiency. LVEF 25% by echocardiogram. Will need followup study after 8-12 weeks once rhythm is ablated and on stable regimen - potentially tachycardia mediated.  4. Essential hypertension. Recent SBP 120-140 range. Continue current medications  5.Intermittent nausea/vomiting/abdominal pain. Workup per primary team, recent note reviewed.   Plan/Discussion:    Continue current cardiac regimen. EP following for timing of ablation.   Satira Sark, M.D., F.A.C.C.

## 2015-09-05 NOTE — Progress Notes (Signed)
Inpatient Rehabilitation  I met with the patient at the bedside and spoke with his wife concurrently over speaker phone.  I explained about the IP  Rehab program and answered wife and pt's questions.  I also spoke with pt's and wife's CNA,  Anne Ng Redd through [facility]" in [place].  Anne Ng is in the home several hours each day, 7 days per week to assist pt., wife and their daughter.  Pt. and wife would prefer IP Rehab when pt. medically ready for post acute rehabilitation.  Will continue to follow along.    [facility] Admissions Coordinator Cell [phone] Office [phone]

## 2015-09-05 NOTE — Consult Note (Signed)
WOC follow up with PT that provided hydrotherapy to sacral wound today, continues to need treatments to clear necrotic tissue. PT reports she just finished up with this patient.  [place] nurse will attempt to assess patient with PT later in this week.    Kyser Wandel [place] RN,CWOCN [id number]

## 2015-09-05 NOTE — Progress Notes (Signed)
CRITICAL VALUE ALERT  Critical value received:  INR 4.48  Date of notification: 09/05/2015  Time of notification:  06:50  Critical value read back: Yes  Nurse who received alert:  Iran Planas  MD notified (1st page):  Baltazar Najjar  Time of first page:  06:58  MD notified (2nd page):  Time of second page:  Responding MD:   Time MD responded:

## 2015-09-06 LAB — GLUCOSE, CAPILLARY
Glucose-Capillary: 110 mg/dL — ABNORMAL HIGH (ref 65–99)
Glucose-Capillary: 140 mg/dL — ABNORMAL HIGH (ref 65–99)
Glucose-Capillary: 166 mg/dL — ABNORMAL HIGH (ref 65–99)
Glucose-Capillary: 85 mg/dL (ref 65–99)

## 2015-09-06 LAB — HEPATIC FUNCTION PANEL
ALT: 17 U/L (ref 17–63)
AST: 18 U/L (ref 15–41)
Albumin: 2.5 g/dL — ABNORMAL LOW (ref 3.5–5.0)
Alkaline Phosphatase: 53 U/L (ref 38–126)
Bilirubin, Direct: 0.2 mg/dL (ref 0.1–0.5)
Indirect Bilirubin: 0.5 mg/dL (ref 0.3–0.9)
Total Bilirubin: 0.7 mg/dL (ref 0.3–1.2)
Total Protein: 6 g/dL — ABNORMAL LOW (ref 6.5–8.1)

## 2015-09-06 LAB — PROTIME-INR
INR: 4.87 (ref 0.00–1.49)
Prothrombin Time: 44.1 seconds — ABNORMAL HIGH (ref 11.6–15.2)

## 2015-09-06 NOTE — Progress Notes (Signed)
Patient has refused CPAP for tonight. Patient states he does not want to wear it. Patient in no distress at this time.

## 2015-09-06 NOTE — Progress Notes (Signed)
PROGRESS NOTE  Frank Hoover  KTG:[id number] DOB: [birth year] DOA: 08/14/2015 PCP: Alonza Bogus, MD    Brief Narrative:  65 year old male originally seen by his Nephrologist in [place], found to be in A. fib with RVR , admitted to [facility] on 08/15/15.  2-D echo showed EF 25%, and he developed septic and cardiogenic shock, w/ temp 104.  He was transferred to [facility] and treated for bacteremia due to UTI, requiring pressor support.  He required intubation, and was successfully extubated on 6/19.  He was transferred to [facility] on 6/20.   Patient was seen by Cardiology and EP for atrial flutter and SVT.  He converted to NSR after TEE DCCV on 6/20, and unfortunately went back into A. fib with RVR.  While on anticoagulation for atrial fibrillation, he developed bright red blood per rectum.  Flexible sigmoidoscopy was significant for ischemic colitis. Ischemic colitis improved.   Assessment & Plan:   Acute hypoxic resp failure from septic shock.  - With history of OSA, COPD, and cardiogenic/septic shock - resolved w/ sats 100% on RA today  - CPAP QHS for sleep apnea - no new complaints.   Septic shock / Citrobacter bacteremia in the setting of UTI - Septic shock resolved - Blood cultures and urine culture growing Citrobacter - completed 2 weeks treatment on 6/28 -  repeat blood cultures 6/22: One bottle out of 2 sets growing micrococcus species, most c/w contamination. - afebrile and wbc count is normal.  Lower GI bleed secondary to ischemic colitis - Secondary to ischemic colitis as confirmed by biopsy, status post flexible sigmoidoscopy on 6/23 - transfused 2 units PRBC on 6/25, hemoglobin stable around 10.  - resumed warfarin, INR supratherapeutic now - Hb stable, no further bleeding   HCAP - has completed treatment for the IV antibiotics.   Abdom pain w/ N/V -possibly from ischemic colitis. Improving nausea , one episode of vomiting today. -supportive care, diet as tolerated    Cardiogenic shock / acute  systolic CHF with EF 42% - clinically stable at this time - net negative ~16L since admit  - not on ACEI/ARB/spironolactone, aldactone due to ARF - EUvolemic, off diuretics at this time, cards following Filed Weights   09/04/15 0333 09/05/15 0600 09/06/15 0528  Weight: 110.768 kg (244 lb 3.2 oz) 106.822 kg (235 lb 8 oz) 110.95 kg (244 lb 9.6 oz)  - denies any chest pain or sob.   Atrial flutter with RVR  - Management per cardiology - status post s/p TEE/DCCV 08/22/15, converted to NSR but then returned to flutter  - was to have ablation 7/3 but was cancelled due to GI sx - follow , scheduled another ablation for SVT and atrial flutter on 09/22/15 - continue warfarin - now in NSR, on amiodarone  Episode of SVT on 08/23/18 - required carotid massage, IV adenosine, EP following  -continue amiodarone  HTN -  Not well controlled.  - prn hydralazine.   AKI on CKD Stage 3 - Continue to monitor - holding diuresis due to GI sx and poor intake - crt peaked at 5.32.  - creatinine improving and its 1.97 today.   Recent Labs Lab 08/31/15 0245 09/01/15 1238 09/02/15 0430 09/03/15 0320 09/04/15 0402 09/05/15 0543  CREATININE 1.82* 1.97* 2.03* 2.09* 2.13* 1.97*    Anemia  - due to chronic kidney disease, chronic illness, and GI bleed - Hgb has stabilized at ~10 - follow   DM  -  CBG (last 3)  Recent Labs  09/05/15 2036 09/06/15 0609 09/06/15 1115  GLUCAP 71 110* 140*    Resume SSI.   Urinary retention -foley per Urology - to remain for several weeks to allow urethra to heal per Urology recommendations, DC to Rehab with foley.    Acute metabolic encephalopathy - appears to have resolved   Unstageable Pressure Injury gluteal cleft  - Wound care following - hydrotherapy per PT  DVT prophylaxis:  Warfarin   Code Status: Full Code  Family Communication: None at bedside today  Disposition Plan:  PLAN FOR CIR , when bed available.    Consultants:  Cards PCCM Urology Inpatient rehabilitation  Procedures:  6/13 R Femoral CVL > 6/13 6/13 ETT > 6/18 6/13 Lt radial aline > 6/17 6/13 RIJ CVL > 6/19 6/25 2 units PRBC transfusion  Subjective: Nausea improved, one episode of vomiting after drinking oranje juice.   Objective: Filed Vitals:   09/05/15 2329 09/06/15 0528 09/06/15 1057 09/06/15 1143  BP: 141/67 172/81 195/85 179/83  Pulse: 66 66 69 69  Temp:  98.6 F (37 C)    TempSrc:  Oral    Resp:  18    Height:      Weight:  110.95 kg (244 lb 9.6 oz)    SpO2:  99%      Intake/Output Summary (Last 24 hours) at 09/06/15 1414 Last data filed at 09/06/15 1100  Gross per 24 hour  Intake    250 ml  Output   2450 ml  Net  -2200 ml   Filed Weights   09/04/15 0333 09/05/15 0600 09/06/15 0528  Weight: 110.768 kg (244 lb 3.2 oz) 106.822 kg (235 lb 8 oz) 110.95 kg (244 lb 9.6 oz)    Examination:  General exam: alert and conversant - pleasant, no distress. Respiratory system: distant bs th/o all fields - no wheeze or crackles  Cardiovascular system: Regular - rate controlled  Gastrointestinal system: obese, soft, BS+, non tender, non distended . Lower extremity: trace B LE edema w/o cyanosis   Data Reviewed:   CBC:  Recent Labs Lab 09/01/15 0607  09/02/15 0430 09/02/15 1035 09/02/15 1512 09/03/15 0320 09/04/15 0402 09/05/15 0543  WBC 4.4  --  4.5  --   --  4.2 4.3 3.6*  HGB 9.6*  < > 9.1* 9.6* 9.7* 10.0* 10.0* 10.4*  HCT 30.3*  < > 28.4* 29.7* 29.9* 31.1* 31.3* 32.6*  MCV 85.4  --  87.4  --   --  84.7 85.1 85.1  PLT 287  --  264  --   --  225 202 207  < > = values in this interval not displayed.   Basic Metabolic Panel:  Recent Labs Lab 08/31/15 0245 09/01/15 1238 09/02/15 0430 09/03/15 0320 09/04/15 0402 09/05/15 0543  NA 134* 134* 132* 133* 141 137  K 4.2 4.9 4.0 3.7 3.3* 3.7  CL 103 103 100* 100* 96* 104  CO2 25 26 26 23 23 25   GLUCOSE 81 132* 147* 114* 102* 111*  BUN 23* 25*  27* 23* 23* 23*  CREATININE 1.82* 1.97* 2.03* 2.09* 2.13* 1.97*  CALCIUM 8.3* 8.7* 8.7* 9.0 9.9 9.0  MG 1.9  --   --  1.7 1.7  --    GFR: Estimated Creatinine Clearance: 44.9 mL/min (by C-G formula based on Cr of 1.97).   Liver Function Tests:  Recent Labs Lab 09/06/15 1138  AST 18  ALT 17  ALKPHOS 53  BILITOT 0.7  PROT 6.0*  ALBUMIN 2.5*  Coagulation Profile:  Recent Labs Lab 09/02/15 0430 09/03/15 0320 09/04/15 0402 09/05/15 0543 09/06/15 0300  INR 1.57* 1.78* 2.64* 4.48* 4.87*   CBG:  Recent Labs Lab 09/05/15 1101 09/05/15 1647 09/05/15 2036 09/06/15 0609 09/06/15 1115  GLUCAP 160* 76 71 110* 140*   Urine analysis:    Component Value Date/Time   COLORURINE AMBER* 08/14/2015 2125   APPEARANCEUR HAZY* 08/14/2015 2125   LABSPEC 1.015 08/14/2015 2125   PHURINE 5.0 08/14/2015 2125   GLUCOSEU 100* 08/14/2015 2125   HGBUR LARGE* 08/14/2015 2125   BILIRUBINUR NEGATIVE 08/14/2015 2125   KETONESUR NEGATIVE 08/14/2015 2125   PROTEINUR 100* 08/14/2015 2125   UROBILINOGEN 0.2 12/02/2013 1739   NITRITE NEGATIVE 08/14/2015 2125   LEUKOCYTESUR MODERATE* 08/14/2015 2125      Anti-infectives    Start     Dose/Rate Route Frequency Ordered Stop   08/27/15 1100  vancomycin (VANCOCIN) 1,250 mg in sodium chloride 0.9 % 250 mL IVPB  Status:  Discontinued     1,250 mg 166.7 mL/hr over 90 Minutes Intravenous Every 24 hours 08/27/15 0842 08/28/15 1125   08/26/15 0130  vancomycin (VANCOCIN) 1,500 mg in sodium chloride 0.9 % 500 mL IVPB  Status:  Discontinued     1,500 mg 250 mL/hr over 120 Minutes Intravenous Every 48 hours 08/26/15 0121 08/27/15 0841   08/24/15 1200  cefTRIAXone (ROCEPHIN) 2 g in dextrose 5 % 50 mL IVPB     2 g 100 mL/hr over 30 Minutes Intravenous Every 24 hours 08/24/15 1055 08/30/15 1249   08/20/15 0000  cefTRIAXone (ROCEPHIN) 2 g in dextrose 5 % 50 mL IVPB  Status:  Discontinued     2 g 100 mL/hr over 30 Minutes Intravenous Every 24 hours  08/19/15 1112 08/22/15 1310   08/18/15 1100  cefTAZidime (FORTAZ) 2 g in dextrose 5 % 50 mL IVPB  Status:  Discontinued     2 g 100 mL/hr over 30 Minutes Intravenous Every 24 hours 08/18/15 1039 08/19/15 1112   08/17/15 1800  vancomycin (VANCOCIN) 1,500 mg in sodium chloride 0.9 % 500 mL IVPB  Status:  Discontinued     1,500 mg 250 mL/hr over 120 Minutes Intravenous Every 48 hours 08/15/15 1636 08/15/15 1956   08/16/15 2100  ceFEPIme (MAXIPIME) 1 g in dextrose 5 % 50 mL IVPB  Status:  Discontinued     1 g 100 mL/hr over 30 Minutes Intravenous Every 24 hours 08/15/15 1957 08/16/15 1415   08/16/15 2100  ceFEPIme (MAXIPIME) 2 g in dextrose 5 % 50 mL IVPB  Status:  Discontinued     2 g 100 mL/hr over 30 Minutes Intravenous Every 24 hours 08/16/15 1415 08/16/15 1741   08/16/15 1900  piperacillin-tazobactam (ZOSYN) IVPB 2.25 g  Status:  Discontinued     2.25 g 100 mL/hr over 30 Minutes Intravenous Every 8 hours 08/16/15 1809 08/18/15 1014   08/15/15 2100  ceFEPIme (MAXIPIME) 2 g in dextrose 5 % 50 mL IVPB     2 g 100 mL/hr over 30 Minutes Intravenous  Once 08/15/15 1957 08/15/15 2359   08/15/15 1730  vancomycin (VANCOCIN) 2,000 mg in sodium chloride 0.9 % 500 mL IVPB     2,000 mg 250 mL/hr over 120 Minutes Intravenous  Once 08/15/15 1636 08/15/15 2000   08/15/15 0900  piperacillin-tazobactam (ZOSYN) IVPB 3.375 g  Status:  Discontinued     3.375 g 12.5 mL/hr over 240 Minutes Intravenous Every 8 hours 08/15/15 0856 08/15/15 1956   08/15/15 0100  vancomycin (VANCOCIN) IVPB 1000 mg/200 mL premix     1,000 mg 200 mL/hr over 60 Minutes Intravenous STAT 08/15/15 0048 08/15/15 0206   08/14/15 2330  vancomycin (VANCOCIN) IVPB 1000 mg/200 mL premix     1,000 mg 200 mL/hr over 60 Minutes Intravenous STAT 08/14/15 2316 08/15/15 0041   08/14/15 2330  piperacillin-tazobactam (ZOSYN) IVPB 3.375 g     3.375 g 12.5 mL/hr over 240 Minutes Intravenous STAT 08/14/15 2317 08/15/15 0341     Scheduled  Meds: . amiodarone  200 mg Oral Daily  . collagenase   Topical Daily  . feeding supplement (ENSURE ENLIVE)  237 mL Oral TID BM  . feeding supplement (PRO-STAT SUGAR FREE 64)  30 mL Oral TID BM  . hydrALAZINE  75 mg Oral Q8H  . insulin aspart  0-15 Units Subcutaneous TID WC  . insulin glargine  34 Units Subcutaneous Daily  . isosorbide mononitrate  60 mg Oral Daily  . metoprolol tartrate  100 mg Oral BID  . multivitamin with minerals  1 tablet Oral Daily  . oxybutynin  5 mg Oral TID  . pantoprazole  40 mg Oral Daily  . polyethylene glycol  17 g Oral BID  . senna-docusate  1 tablet Oral BID  . Warfarin - Pharmacist Dosing Inpatient   Does not apply q1800     LOS: 23 days   Time spent: 35 min Hosie Poisson, MD [facility] [phone]  On-Call/Text Page:      [email or web address]      password [email or web address]  If 7PM-7AM, please contact night-coverage [email or web address] Password [email or web address] 09/06/2015, 2:14 PM

## 2015-09-06 NOTE — Progress Notes (Signed)
OT Cancellation Note  Patient Details Name: Frank Hoover MRN: [id number] DOB: 14-Dec-1950   Cancelled Treatment:    Reason Eval/Treat Not Completed: Other (comment) (INR trending up = 4.87). Will check back as schedule allows for OT treatment.   Chrys Racer , MS, OTR/L, CLT Pager: [phone]  09/06/2015, 8:35 AM

## 2015-09-06 NOTE — Progress Notes (Signed)
ANTICOAGULATION CONSULT NOTE - Follow Up Consult  Pharmacy Consult for Coumadin Indication: atrial fibrillation  No Known Allergies  Patient Measurements: Height: 5\' 6"  (167.6 cm) Weight: 244 lb 9.6 oz (110.95 kg) IBW/kg (Calculated) : 63.8 Heparin Dosing Weight: 94kg  Vital Signs: Temp: 98.6 F (37 C) (07/05 0528) Temp Source: Oral (07/05 0528) BP: 172/81 mmHg (07/05 0528) Pulse Rate: 66 (07/05 0528)  Labs:  Recent Labs  09/04/15 0402 09/04/15 0420 09/05/15 0543 09/06/15 0300  HGB 10.0*  --  10.4*  --   HCT 31.3*  --  32.6*  --   PLT 202  --  207  --   LABPROT 27.8*  --  41.4* 44.1*  INR 2.64*  --  4.48* 4.87*  HEPARINUNFRC  --  0.57  --   --   CREATININE 2.13*  --  1.97*  --     Estimated Creatinine Clearance: 44.9 mL/min (by C-G formula based on Cr of 1.97).  . sodium chloride Stopped (09/04/15 1400)     Assessment: 4 YOM admitted 08/14/2015 with newly diagnosed AFlutter with RVR (s/p DCCV on 6/20). Pharmacy consulted to dose coumadin. INR up to 6.36 and Vitamin K 5mg  IV given x 1 (6/24).He is noted with recent a lower GI bleed (flex sigmoidoscopy 6/23 w/ biopsy). Will keep on the lower end of goal.  INR= 4.48>4.87. Hg low stable, plt wnl, no bleed documented. Will need to restart coumadin with a very low dose (consider 1mg ).  Goal of Therapy:  INR goal 2-2.5 Monitor platelets by anticoagulation protocol: Yes   Plan:  -Hold Coumadin tonight - when ok, would start with 1mg  and maintain -Daily INR -Monitor s/sx bleeding   Elicia Lamp, PharmD, BCPS Clinical Pharmacist Pager [phone] 09/06/2015 10:10 AM

## 2015-09-06 NOTE — Progress Notes (Signed)
Inpatient Rehabilitation  I spoke with the patient this am.  He indicated that he thinks he is to remain in the hospital until his ablation is completed on 09/22/15.  I phoned Dr. Karleen Hampshire to discuss the plan before initiating insurance authorization process. Dr. Karleen Hampshire stated she will reach out to Cardiology for their input and will provide me with an update so I will know whether to pursue authorization now or in the future.  I will await a return call.   [place] Admissions Coordinator Cell [phone] Office [phone]

## 2015-09-06 NOTE — Care Management Important Message (Signed)
Important Message  Patient Details  Name: Frank Hoover MRN: [id number] Date of Birth: [birth year]   Medicare Important Message Given:  Yes    Nathen May 09/06/2015, 9:39 AM

## 2015-09-06 NOTE — Progress Notes (Signed)
Nutrition Follow-up  DOCUMENTATION CODES:   Morbid obesity  INTERVENTION:    Continue Ensure Enlive po TID, each supplement provides 350 kcal and 20 grams of protein   Continue Prostat liquid protein po 30 ml TID with meals, each supplement provides 100 kcal, 15 grams protein  NUTRITION DIAGNOSIS:   Inadequate oral intake related to  (decreased appetite) as evidenced by per patient/family report, ongoing  GOAL:   Patient will meet greater than or equal to 90% of their needs, progressing  MONITOR:   PO intake, Supplement acceptance, Labs, Weight trends, Skin, I & O's  ASSESSMENT:   65 y/o M with admitted 6/12 to [facility] for atrial flutter, concern for UTI / sepsis. Decompensated 6/13 requiring vasopressors and intubation. 6/14 CT abd/pelvis >> distal proctocolitis  Pt underwent flex sigmoidoscopy on 08/25/15. PO intake 100% per flowsheets this AM. Noted abdominal pain with some nausea. Continues with hydrotherapy per PT to sacral wound. Receiving Ensure Enlive and Prostat liquid protein TID.  Diet Order:  Diet heart healthy/carb modified Room service appropriate?: Yes; Fluid consistency:: Thin  Skin:  Wound (see comment) (UN gluteal cleft)  Last BM:  7/3  Height:   Ht Readings from Last 1 Encounters:  09/01/15 5\' 6"  (1.676 m)    Weight:   Wt Readings from Last 1 Encounters:  09/06/15 244 lb 9.6 oz (110.95 kg)    Ideal Body Weight:  64.5 kg  BMI:  Body mass index is 39.5 kg/(m^2).  Estimated Nutritional Needs:   Kcal:  2100-2300  Protein:  110-125 grams  Fluid:  2 L/day  EDUCATION NEEDS:   No education needs identified at this time  Arthur Holms, RD, LDN Pager #: [phone] After-Hours Pager #: [phone]

## 2015-09-06 NOTE — Discharge Instructions (Signed)
Ablation scheduled for 09/22/15 at 7:30AM with Dr Lovena Le. Arrive at Short Stay at [facility], [place] at 6AM morning of procedure. Nothing to eat or drink after midnight night before procedure.  Hold Amiodarone for 4 days prior to procedure. Plan on spending one night in the hospital, you will need someone to drive you home. Call the office with any questions or if you need to reschedule - [phone].

## 2015-09-06 NOTE — Progress Notes (Signed)
Seen for followup: Atrial flutter, cardiomyopathy  Subjective:    Getting hydrotherapy. No palpitations or chest pain.  Objective:   Temp:  [98.6 F (37 C)-99.2 F (37.3 C)] 98.6 F (37 C) (07/05 0528) Pulse Rate:  [66-95] 66 (07/05 0528) Resp:  [18] 18 (07/05 0528) BP: (141-186)/(67-87) 172/81 mmHg (07/05 0528) SpO2:  [96 %-99 %] 99 % (07/05 0528) Weight:  [244 lb 9.6 oz (110.95 kg)] 244 lb 9.6 oz (110.95 kg) (07/05 0528) Last BM Date: 09/04/15  Filed Weights   09/04/15 0333 09/05/15 0600 09/06/15 0528  Weight: 244 lb 3.2 oz (110.768 kg) 235 lb 8 oz (106.822 kg) 244 lb 9.6 oz (110.95 kg)    Intake/Output Summary (Last 24 hours) at 09/06/15 1009 Last data filed at 09/06/15 0807  Gross per 24 hour  Intake    250 ml  Output   1950 ml  Net  -1700 ml    Telemetry: Sinus rhythm.  Exam:  General: Obese male in no distress.  CV: Normal rhythm  Extremities: Mild leg edema.  Lab Results:  Basic Metabolic Panel:  Recent Labs Lab 08/31/15 0245  09/03/15 0320 09/04/15 0402 09/05/15 0543  NA 134*  < > 133* 141 137  K 4.2  < > 3.7 3.3* 3.7  CL 103  < > 100* 96* 104  CO2 25  < > 23 23 25   GLUCOSE 81  < > 114* 102* 111*  BUN 23*  < > 23* 23* 23*  CREATININE 1.82*  < > 2.09* 2.13* 1.97*  CALCIUM 8.3*  < > 9.0 9.9 9.0  MG 1.9  --  1.7 1.7  --   < > = values in this interval not displayed.  Liver Function Tests: No results for input(s): AST, ALT, ALKPHOS, BILITOT, PROT, ALBUMIN in the last 168 hours.  CBC:  Recent Labs Lab 09/03/15 0320 09/04/15 0402 09/05/15 0543  WBC 4.2 4.3 3.6*  HGB 10.0* 10.0* 10.4*  HCT 31.1* 31.3* 32.6*  MCV 84.7 85.1 85.1  PLT 225 202 207    Coagulation:  Recent Labs Lab 09/04/15 0402 09/05/15 0543 09/06/15 0300  INR 2.64* 4.48* 4.87*   Echocardiogram 08/14/2015: Study Conclusions  - Left ventricle: The cavity size was normal. Wall thickness was  increased in a pattern of mild LVH. The estimated ejection   fraction was 25%. Study telemetry shows a regular tachycardia  suggestive of atrial flutter. Diffuse hypokinesis. Probable  akinesis of the inferoseptal myocardium. The study is not  technically sufficient to allow evaluation of LV diastolic  function. - Aortic valve: Mildly calcified annulus. Trileaflet. - Mitral valve: There was trivial regurgitation. - Left atrium: The atrium was mildly dilated. - Right ventricle: Systolic function was moderately reduced. - Right atrium: The atrium was at the upper limits of normal in  size. Central venous pressure (est): 8 mm Hg. - Atrial septum: There was a patent foramen ovale. Bidirectional  shunting noted by color Doppler. - Tricuspid valve: There was mild regurgitation. - Pulmonary arteries: PA peak pressure: 31 mm Hg (S). - Pericardium, extracardiac: There was no pericardial effusion.  Impressions:  - Mild LVH with LVEF approximately 25%, diffuse hypokinesis with  probable akinesis of the inferior septum. Left ventricular  dysfunction is new in comparison to the previous study from 2015.  Study telemetry looks to show atrial flutter with RVR.  Indeterminate diastolic function. Mild left atrial enlargement.  Trivial mitral regurgitation. Moderately reduced RV contraction.  Mild tricuspid regurgitation with PASP  31 mmHg. 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   Medications:   Scheduled Medications: . amiodarone  200 mg Oral Daily  . collagenase   Topical Daily  . feeding supplement (ENSURE ENLIVE)  237 mL Oral TID BM  . feeding supplement (PRO-STAT SUGAR FREE 64)  30 mL Oral TID BM  . hydrALAZINE  75 mg Oral Q8H  . insulin aspart  0-15 Units Subcutaneous TID WC  . insulin glargine  34 Units Subcutaneous Daily  . isosorbide mononitrate  60 mg Oral Daily  . metoprolol tartrate  100 mg Oral BID  . multivitamin with minerals  1 tablet Oral Daily  . oxybutynin  5 mg Oral TID  . pantoprazole  40 mg Oral  Daily  . polyethylene glycol  17 g Oral BID  . senna-docusate  1 tablet Oral BID  . Warfarin - Pharmacist Dosing Inpatient   Does not apply q1800    Infusions: . sodium chloride Stopped (09/04/15 1400)    PRN Medications: acetaminophen, hydrALAZINE, HYDROcodone-acetaminophen, levalbuterol, metoprolol, ondansetron (ZOFRAN) IV, prochlorperazine, sodium chloride flush   Assessment:   1. Atrial flutter (Typical) and short RP SVT s/p DCCV x 2 during hospitalization. Ablation sched July 21 with Dr. Lovena Le, EP once patient further improved. He is in sinus rhythm now on Amiodarone and Coumadin.  2. GI bleed secondary to ischemic colitis, Hb stable at 10.4.  3. Acute systolic heart failure. Improved. On Lopressor, Hydralazine, and Imdur. No ACE-I or ARB now with renal insufficiency. LVEF 25% by echocardiogram. Will need followup study after 8-12 weeks once rhythm is ablated and on stable regimen - potentially tachycardia mediated.  4. Essential hypertension. Recent SBP 120-140 range. Occasional 160's. Continue current medications  5.Intermittent nausea/vomiting/abdominal pain. Workup per primary team, recent note reviewed.   Plan/Discussion:    Continue current cardiac regimen. EP has seen this am and placed note on timing of ablation.   Candee Furbish, MD

## 2015-09-06 NOTE — Progress Notes (Signed)
Pt much improved this morning, asking when he can go home. We discussed timing of ablation.  Scheduled for 09/22/15 at 7:30AM with Dr Lovena Le.  Instructions entered in AVS and reviewed with patient.  Chanetta Marshall, NP 09/06/2015 9:07 AM  EP Attending  Patient is well known to me. Agree with above. Will plan ablation of SVT and atrial flutter.  Mikle Bosworth.D.

## 2015-09-06 NOTE — Progress Notes (Signed)
CRITICAL VALUE ALERT  Critical value received: INR 4.87  Date of notification:  09/06/2015  Time of notification:  0420  Critical value read back:Yes.    Nurse who received alert:  Alexia Freestone  MD notified (1st page):  Tylene Fantasia   Time of first page:  [id number]  No new orders at this time.

## 2015-09-06 NOTE — Progress Notes (Signed)
Physical Therapy Wound Treatment Patient Details  Name: Frank Hoover MRN: [id number] Date of Birth: 02/12/1951  Today's Date: 09/06/2015 Time: 8657-8469 Time Calculation (min): 37 min  Subjective  Subjective: Pt pleasant and agreeable to therapy Patient and Family Stated Goals: Heal wound Date of Onset:  (Unknown)  Pain Score: Pt reports minimal pain throughout session. Asking for pain meds after session.   Wound Assessment  Pressure Ulcer 08/29/15 Unstageable - Full thickness tissue loss in which the base of the ulcer is covered by slough (yellow, tan, gray, green or brown) and/or eschar (tan, brown or black) in the wound bed. 100% yellow-per staff started as blister (Active)  Dressing Type ABD;Barrier Film (skin prep);Gauze (Comment);Moist to dry 09/06/2015  1:05 PM  Dressing Changed;Clean;Dry;Intact 09/06/2015  1:05 PM  Dressing Change Frequency Daily 09/06/2015  1:05 PM  State of Healing Eschar 09/06/2015  1:05 PM  Site / Wound Assessment Yellow;Pink;Black 09/06/2015  1:05 PM  % Wound base Red or Granulating 30% 09/06/2015  1:05 PM  % Wound base Yellow 65% 09/06/2015  1:05 PM  % Wound base Black 5% 09/06/2015  1:05 PM  % Wound base Other (Comment) 0% 09/06/2015  1:05 PM  Peri-wound Assessment Intact 09/06/2015  1:05 PM  Wound Length (cm) 11.8 cm 09/06/2015  1:05 PM  Wound Width (cm) 7.2 cm 09/06/2015  1:05 PM  Wound Depth (cm) 1.2 cm 09/06/2015  1:05 PM  Margins Unattached edges (unapproximated) 09/06/2015  1:05 PM  Drainage Amount Minimal 09/06/2015  1:05 PM  Drainage Description Purulent;Green;Odor 09/06/2015  1:05 PM  Treatment Debridement (Selective);Hydrotherapy (Pulse lavage);Packing (Saline gauze) 09/06/2015  1:05 PM  Santyl applied to wound bed prior to applying dressing.   Hydrotherapy Pulsed lavage therapy - wound location: Gluteal cleft Pulsed Lavage with Suction (psi): 12 psi Pulsed Lavage with Suction - Normal Saline Used: 1000 mL Pulsed Lavage Tip: Tip with splash shield Selective  Debridement Selective Debridement - Location: Gluteal cleft Selective Debridement - Tools Used: Forceps;Scissors Selective Debridement - Tissue Removed: black and yellow necrotic tissue   Wound Assessment and Plan  Wound Therapy - Assess/Plan/Recommendations Wound Therapy - Clinical Statement: Continued slow, steady progress with the removal of necrotic tissue. Wound Therapy - Functional Problem List: Decreased tolerance of OOB, acute pain.  Factors Delaying/Impairing Wound Healing: Altered sensation;Diabetes Mellitus;Immobility;Multiple medical problems Hydrotherapy Plan: Debridement;Dressing change;Patient/family education;Pulsatile lavage with suction Wound Therapy - Frequency: 6X / week Wound Therapy - Follow Up Recommendations: Other (comment) (CIR) Wound Plan: See above  Wound Therapy Goals- Improve the function of patient's integumentary system by progressing the wound(s) through the phases of wound healing (inflammation - proliferation - remodeling) by: Decrease Necrotic Tissue to: 25% Decrease Necrotic Tissue - Progress: Progressing toward goal Increase Granulation Tissue to: 75% Increase Granulation Tissue - Progress: Progressing toward goal Goals/treatment plan/discharge plan were made with and agreed upon by patient/family: Yes Time For Goal Achievement: 7 days Wound Therapy - Potential for Goals: Good  Goals will be updated until maximal potential achieved or discharge criteria met.  Discharge criteria: when goals achieved, discharge from hospital, MD decision/surgical intervention, no progress towards goals, refusal/missing three consecutive treatments without notification or medical reason.  GP     Rolinda Roan 09/06/2015, 1:11 PM   Rolinda Roan, PT, DPT Acute Rehabilitation Services Pager: [phone]

## 2015-09-06 NOTE — Progress Notes (Signed)
Physical Therapy Treatment Patient Details Name: Frank Hoover MRN: [id number] DOB: 1950-05-25 Today's Date: 09/06/2015    History of Present Illness 65 year old male with PMH of GERD, OSA, COPD, admitted with dizziness, tachycardia and leg edema. Dx with acute cardiogenic/septic shock, acute diastolic CHF, A-flutter, intubated 6/13-6/18. Pt underwent TEE with cardioversion 6/20. Developed significant anemia, Hemoccult-positive stool S/P Flex Sig 6/23, suspicious for ischemic colitis.    PT Comments    Pt admitted with above diagnosis. Pt currently with functional limitations due to balance and endurance deficits. Pt was able to ambulate with RW with min assist with chair follow.  Couldn't ambulate far but was able to ambulate a short distance.  Progressing.   Pt will benefit from skilled PT to increase their independence and safety with mobility to allow discharge to the venue listed below.    Follow Up Recommendations  CIR     Equipment Recommendations  Other (comment) (TBA at next venue)    Recommendations for Other Services       Precautions / Restrictions Precautions Precautions: Fall Restrictions Weight Bearing Restrictions: No    Mobility  Bed Mobility Overal bed mobility: Needs Assistance;+2 for physical assistance Bed Mobility: Supine to Sit     Supine to sit: Mod assist;+2 for physical assistance     General bed mobility comments: Pt required assist to move his LEs off the EOB and assist to lift trunk from bed.  He initially required assist to scoot to EOB, but  was able to scoot to EOB with min guard assist   Transfers Overall transfer level: Needs assistance Equipment used: Rolling walker (2 wheeled) Transfers: Sit to/from Stand Sit to Stand: Min assist;+2 physical assistance         General transfer comment: Assist to move into standing and assist to steady   Ambulation/Gait Ambulation/Gait assistance: Min assist;+2 physical assistance Ambulation  Distance (Feet): 25 Feet (15 feet then 10 feet) Assistive device: Rolling walker (2 wheeled) Gait Pattern/deviations: Step-through pattern;Decreased stride length;Decreased step length - right;Decreased step length - left;Staggering right;Staggering left;Wide base of support   Gait velocity interpretation: Below normal speed for age/gender General Gait Details: Pt was able to ambulate with RW but did fatigue rather quickly with UEs fatiguing due to using UE support quite a bit.  Needed steadying assist a few times while ambulating.    Stairs            Wheelchair Mobility    Modified Rankin (Stroke Patients Only)       Balance Overall balance assessment: Needs assistance Sitting-balance support: No upper extremity supported;Feet supported Sitting balance-Leahy Scale: Fair     Standing balance support: Bilateral upper extremity supported;During functional activity Standing balance-Leahy Scale: Poor Standing balance comment: Needed bil UE support for sttanding.  Relies heaavily on UEs at times.                     Cognition Arousal/Alertness: Awake/alert Behavior During Therapy: Flat affect Overall Cognitive Status: Impaired/Different from baseline Area of Impairment: Attention;Following commands;Problem solving;Safety/judgement   Current Attention Level: Sustained Memory: Decreased short-term memory Following Commands: Follows one step commands with increased time     Problem Solving: Slow processing;Requires verbal cues;Decreased initiation      Exercises General Exercises - Lower Extremity Ankle Circles/Pumps: AROM;Both;10 reps;Supine Long Arc Quad: AROM;Both;10 reps;Seated    General Comments        Pertinent Vitals/Pain Pain Assessment: Faces Faces Pain Scale: Hurts little more Pain Location: buttocks  Pain Descriptors / Indicators: Aching;Sore Pain Intervention(s): Limited activity within patient's tolerance;Monitored during session;Repositioned   VSS    Home Living                      Prior Function            PT Goals (current goals can now be found in the care plan section) Progress towards PT goals: Progressing toward goals    Frequency  Min 3X/week    PT Plan Current plan remains appropriate    Co-evaluation             End of Session Equipment Utilized During Treatment: Gait belt Activity Tolerance: Patient limited by fatigue Patient left: with call bell/phone within reach;in bed;with family/visitor present;with restraints reapplied     Time: [id number] PT Time Calculation (min) (ACUTE ONLY): 26 min  Charges:  $Gait Training: 8-22 mins $Therapeutic Exercise: 8-22 mins                    G CodesIrwin Brakeman F 09-18-2015, 4:19 PM  [facility] Acute Rehabilitation [phone] (pager)

## 2015-09-06 NOTE — Progress Notes (Signed)
Spoke with Ander Purpura RN pt nurse. Notified that pt has a midline and unable to draw blood. Notified that we cannot TPA midline and that phlebotomy will have to draw all labs peripherally. RN VU. Fran Lowes, RN VAST

## 2015-09-07 DIAGNOSIS — I4891 Unspecified atrial fibrillation: Secondary | ICD-10-CM

## 2015-09-07 LAB — BASIC METABOLIC PANEL
Anion gap: 8 (ref 5–15)
BUN: 24 mg/dL — ABNORMAL HIGH (ref 6–20)
CO2: 23 mmol/L (ref 22–32)
Calcium: 8.7 mg/dL — ABNORMAL LOW (ref 8.9–10.3)
Chloride: 101 mmol/L (ref 101–111)
Creatinine, Ser: 2.02 mg/dL — ABNORMAL HIGH (ref 0.61–1.24)
GFR calc Af Amer: 39 mL/min — ABNORMAL LOW (ref >60)
GFR calc non Af Amer: 33 mL/min — ABNORMAL LOW (ref >60)
Glucose, Bld: 193 mg/dL — ABNORMAL HIGH (ref 65–99)
Potassium: 3.6 mmol/L (ref 3.5–5.1)
Sodium: 132 mmol/L — ABNORMAL LOW (ref 135–145)

## 2015-09-07 LAB — GLUCOSE, CAPILLARY
Glucose-Capillary: 101 mg/dL — ABNORMAL HIGH (ref 65–99)
Glucose-Capillary: 219 mg/dL — ABNORMAL HIGH (ref 65–99)
Glucose-Capillary: 119 mg/dL — ABNORMAL HIGH (ref 65–99)
Glucose-Capillary: 109 mg/dL — ABNORMAL HIGH (ref 65–99)

## 2015-09-07 LAB — PROTIME-INR
INR: 2.62 — ABNORMAL HIGH (ref 0.00–1.49)
Prothrombin Time: 27.6 seconds — ABNORMAL HIGH (ref 11.6–15.2)

## 2015-09-07 MED ORDER — HYDRALAZINE HCL 25 MG PO TABS: 75.0000 mg | ORAL_TABLET | Freq: Three times a day (TID) | ORAL | Status: DC

## 2015-09-07 MED ORDER — ADULT MULTIVITAMIN W/MINERALS CH: 1.0000 | ORAL_TABLET | Freq: Every day | ORAL | Status: AC

## 2015-09-07 MED ORDER — AMIODARONE HCL 200 MG PO TABS: 200.0000 mg | ORAL_TABLET | Freq: Every day | ORAL | Status: DC

## 2015-09-07 MED ORDER — FUROSEMIDE 40 MG PO TABS: 40.0000 mg | ORAL_TABLET | Freq: Two times a day (BID) | ORAL | Status: DC

## 2015-09-07 MED ORDER — PRO-STAT SUGAR FREE PO LIQD: 30.0000 mL | Freq: Three times a day (TID) | ORAL | Status: DC

## 2015-09-07 MED ORDER — ENSURE ENLIVE PO LIQD: 237.0000 mL | Freq: Three times a day (TID) | ORAL | Status: DC

## 2015-09-07 MED ORDER — INSULIN ASPART 100 UNIT/ML ~~LOC~~ SOLN: 0.0000 [IU] | Freq: Three times a day (TID) | SUBCUTANEOUS | Status: DC

## 2015-09-07 MED ORDER — COLLAGENASE 250 UNIT/GM EX OINT: TOPICAL_OINTMENT | Freq: Every day | CUTANEOUS | Status: DC

## 2015-09-07 MED ORDER — SENNOSIDES-DOCUSATE SODIUM 8.6-50 MG PO TABS: 1.0000 | ORAL_TABLET | Freq: Two times a day (BID) | ORAL | Status: DC

## 2015-09-07 MED ORDER — ISOSORBIDE MONONITRATE ER 60 MG PO TB24: 60.0000 mg | ORAL_TABLET | Freq: Every day | ORAL | Status: DC

## 2015-09-07 MED ORDER — FUROSEMIDE 40 MG PO TABS
40.0000 mg | ORAL_TABLET | Freq: Two times a day (BID) | ORAL | Status: DC
  Administered 2015-09-07 – 2015-09-08 (×2): 40 mg via ORAL
  Filled 2015-09-07 (×2): qty 1

## 2015-09-07 MED ORDER — INSULIN GLARGINE 100 UNIT/ML ~~LOC~~ SOLN: 34.0000 [IU] | Freq: Every day | SUBCUTANEOUS | Status: DC

## 2015-09-07 MED ORDER — PANTOPRAZOLE SODIUM 40 MG PO TBEC: 40.0000 mg | DELAYED_RELEASE_TABLET | Freq: Every day | ORAL | Status: DC

## 2015-09-07 MED ORDER — HYDROCODONE-ACETAMINOPHEN 5-325 MG PO TABS: 1.0000 | ORAL_TABLET | ORAL | Status: DC | PRN

## 2015-09-07 MED ORDER — OXYBUTYNIN CHLORIDE 5 MG PO TABS: 5.0000 mg | ORAL_TABLET | Freq: Three times a day (TID) | ORAL | Status: DC

## 2015-09-07 NOTE — Progress Notes (Signed)
Powerglide midline flushes well without resistance however, I'm unable to pull blood from this powerglide midline. Lab will be notified that this patient will need a venipuncture for blood work. [employer]ina [employer]

## 2015-09-07 NOTE — Progress Notes (Addendum)
Occupational Therapy Treatment Patient Details Name: Frank Hoover MRN: [id number] DOB: [birth year] Today's Date: 09/07/2015    History of present illness 65 year old male with PMH of GERD, OSA, COPD, admitted with dizziness, tachycardia and leg edema. Dx with acute cardiogenic/septic shock, acute diastolic CHF, A-flutter, intubated 6/13-6/18. Pt underwent TEE with cardioversion 6/20. Developed significant anemia, Hemoccult-positive stool S/P Flex Sig 6/23, suspicious for ischemic colitis.   OT comments  Pt making great progress towards ADL and mobility goals. Pt required significantly less assistance with bed mobility and basic transfers, and seated LB ADLs requiring overall mod assist with +2 for safety. Pt continues to fatigue quickly and required 3 rest breaks ambulating 30'. Continue to recommend CIR as pt demonstrates great potential to reach mod I level of functioning - pt's family reports he is very private and will need extra encouragement to ambulate in hallway. Will continue to follow acutely.   Follow Up Recommendations  CIR;Supervision/Assistance - 24 hour    Equipment Recommendations  Other (comment) (TBD at next venue)    Recommendations for Other Services      Precautions / Restrictions Precautions Precautions: Fall Restrictions Weight Bearing Restrictions: No       Mobility Bed Mobility Overal bed mobility: Needs Assistance Bed Mobility: Supine to Sit;Sit to Supine     Supine to sit: Min assist Sit to supine: Min assist   General bed mobility comments: HOB flat, use of bedrails. Very light min assist to move BLE on/off bed.  Transfers Overall transfer level: Needs assistance Equipment used: Rolling walker (2 wheeled) Transfers: Sit to/from Stand Sit to Stand: Mod assist;+2 safety/equipment         General transfer comment: Assist for boost to stand and to stabilize balance upon standing. VCs for safe hand placement and for encouragement. Pt requires  frequent rest breaks x3 - HR remained between 90-93 throughout activity.    Balance Overall balance assessment: Needs assistance Sitting-balance support: No upper extremity supported;Feet supported Sitting balance-Leahy Scale: Good Sitting balance - Comments: Pt able to maintain EOB sitting with supervision.   Standing balance support: Bilateral upper extremity supported;During functional activity Standing balance-Leahy Scale: Poor Standing balance comment: Reliant on UE support for balance                   ADL Overall ADL's : Needs assistance/impaired     Grooming: Wash/dry hands;Minimal assistance;Standing   Upper Body Bathing: Minimal assitance;Sitting   Lower Body Bathing: Maximal assistance;Sit to/from stand   Upper Body Dressing : Minimal assistance;Sitting   Lower Body Dressing: Maximal assistance;Sit to/from stand   Toilet Transfer: Moderate assistance;+2 for safety/equipment;Cueing for safety;BSC;RW;Ambulation   Toileting- Clothing Manipulation and Hygiene: Moderate assistance;+2 for safety/equipment;Sit to/from stand       Functional mobility during ADLs: Moderate assistance;Cueing for safety;Rolling walker General ADL Comments: Fatigues quickly, requires frequent rest breaks.       Vision                     Perception     Praxis      Cognition   Behavior During Therapy: [facility] for tasks assessed/performed Overall Cognitive Status: Within Functional Limits for tasks assessed Area of Impairment: Problem solving;Safety/judgement;Attention;Following commands   Current Attention Level: Selective Memory: Decreased short-term memory  Following Commands: Follows one step commands consistently Safety/Judgement: Decreased awareness of safety   Problem Solving: Requires verbal cues;Difficulty sequencing      Extremity/Trunk Assessment  Exercises     Shoulder Instructions       General Comments      Pertinent  Vitals/ Pain       Pain Assessment: Faces Faces Pain Scale: Hurts little more Pain Location: buttocks Pain Descriptors / Indicators: Sore Pain Intervention(s): Limited activity within patient's tolerance;Monitored during session;Repositioned  Home Living                                          Prior Functioning/Environment              Frequency Min 2X/week     Progress Toward Goals  OT Goals(current goals can now be found in the care plan section)  Progress towards OT goals: Progressing toward goals  Acute Rehab OT Goals Patient Stated Goal: home OT Goal Formulation: With patient Time For Goal Achievement: 09/18/15 Potential to Achieve Goals: Good ADL Goals Pt Will Perform Grooming: with min guard assist;standing Pt Will Perform Upper Body Bathing: with set-up;sitting Pt Will Perform Lower Body Bathing: with min assist;sit to/from stand Pt Will Perform Upper Body Dressing: with set-up;sitting Pt Will Perform Lower Body Dressing: with min assist;sit to/from stand Pt Will Transfer to Toilet: with min guard assist;ambulating;regular height toilet;bedside commode;grab bars Pt Will Perform Toileting - Clothing Manipulation and hygiene: with min guard assist;sit to/from stand Pt/caregiver will Perform Home Exercise Program: Increased strength;Both right and left upper extremity;With theraband;With minimal assist  Plan Discharge plan remains appropriate    Co-evaluation    PT/OT/SLP Co-Evaluation/Treatment: Yes Reason for Co-Treatment: For patient/therapist safety PT goals addressed during session: Mobility/safety with mobility;Balance OT goals addressed during session: ADL's and self-care;Proper use of Adaptive equipment and DME      End of Session Equipment Utilized During Treatment: Gait belt;Rolling walker   Activity Tolerance Patient tolerated treatment well   Patient Left in bed;with call bell/phone within reach;with family/visitor present    Nurse Communication Mobility status        Time: [id number] OT Time Calculation (min): 23 min  Charges: OT General Charges $OT Visit: 1 Procedure OT Treatments $Self Care/Home Management : 8-22 mins  Redmond Baseman, OTR/L Pager: [phone] 09/07/2015, 10:23 AM

## 2015-09-07 NOTE — Progress Notes (Signed)
Inpatient Rehabilitation  Spoke with Dr. Karleen Hampshire this morning, who clarified that ablation can be done as a outpatient procedure.  Will initiate insurance authorization with [facility] for IP Rehab; have notified assigned OT that updated treatment note is needed for case.  Plan to follow up with team when there is a decision.  Please call with questions.   Carmelia Roller., CCC/SLP Admission Coordinator  [place]  Cell [phone]

## 2015-09-07 NOTE — Discharge Summary (Addendum)
Physician Discharge Summary  Frank Hoover KCM:[id number] DOB: 02-01-1951 DOA: 08/14/2015  PCP: Alonza Bogus, MD  Admit date: 08/14/2015 Discharge date: 09/07/2015  Admitted From: home. Disposition:  CIR  Recommendations for Outpatient Follow-up:  1. Follow up with PCP in 1-2 weeks 2. Please obtain BMP/CBC in 2 days to check creatinine. 3. Follow up with EP for ablation on 7/21 4. Follow up with cardiology as recommended . 5. Please continue with PT hydrotherapy.  6. Follow u pwith urology for urinary retention.  7. Please check INR in 2 days, continue with coumadin.     Discharge Condition:stable CODE STATUS:full  Diet recommendation: Heart Healthy / Carb Modified /  Brief/Interim Summary: 65 year old male originally seen by his Nephrologist in [place], found to be in A. fib with RVR , admitted to [facility] on 08/15/15. 2-D echo showed EF 25%, and he developed septic and cardiogenic shock, w/ temp 104. He was transferred to [facility] and treated for bacteremia due to UTI, requiring pressor support. He required intubation, and was successfully extubated on 6/19. He was transferred to [facility] on 6/20.   Patient was seen by Cardiology and EP for atrial flutter and SVT. He converted to NSR after TEE DCCV on 6/20, and unfortunately went back into A. fib with RVR. While on anticoagulation for atrial fibrillation, he developed bright red blood per rectum. Flexible sigmoidoscopy was significant for ischemic colitis. Ischemic colitis improved. Currently he is in NSR.  He is scheduled for ablation for his SVT and atrial flutter on 7/21 which will be done as outpatient.  Discussed with EP and the patient.   Discharge Diagnoses:  Principal Problem:   Atrial flutter with rapid ventricular response (HCC) Active Problems:   GERD   DM (diabetes mellitus) (HCC)   HTN (hypertension)   Hyperlipemia   Sleep apnea   Systolic CHF (HCC)   Acute respiratory failure (HCC)   Septic shock  (HCC)   Acute encephalopathy   Acute kidney injury ([place])   Acute systolic heart failure (HCC)   SVT (supraventricular tachycardia) ([place])   Debility   Blood in stool   Acute blood loss anemia   Abnormal CT scan, colon   Proctosigmoiditis   Lower GI bleed   Noninfectious gastroenteritis, unspecified   Bacteremia   Ischemic colitis ([place])   HCAP (healthcare-associated pneumonia)   Acute renal failure superimposed on stage 3 chronic kidney disease (HCC)   Metabolic encephalopathy   Abdominal distension   Urinary tract infection, site not specified   Cardiogenic shock (HCC)   Acute systolic CHF (congestive heart failure) (HCC)   Uncontrolled type 2 diabetes mellitus with complication (HCC)   Urinary retention   Abdominal pain   Atrial fibrillation with RVR (HCC)   Pressure ulcer  Acute hypoxic resp failure from septic shock.  - With history of OSA, COPD, and cardiogenic/septic shock - resolved w/ sats 100% on RA today  - CPAP QHS for sleep apnea - no new complaints.   Septic shock / Citrobacter bacteremia in the setting of UTI - Septic shock resolved - Blood cultures and urine culture growing Citrobacter - completed 2 weeks treatment on 6/28 - repeat blood cultures 6/22: One bottle out of 2 sets growing micrococcus species, most c/w contamination. - afebrile and wbc count is normal.  Lower GI bleed secondary to acute ischemic colitis, recto sigmoid area. - Secondary to ischemic colitis as confirmed by biopsy, status post flexible sigmoidoscopy on 6/23 - transfused 2 units PRBC on 6/25, hemoglobin  stable around 10.  - resumed warfarin, INR supratherapeutic now - Hb stable, no further bleeding   HCAP - has completed treatment for the IV antibiotics.   Abdom pain w/ N/V -possibly from ischemic colitis. Improving nausea , and no vomiting .  -supportive care, diet as tolerated   Cardiogenic shock / acute systolic CHF with EF 34% - clinically stable at this time - net  negative ~16L since admit  - not on ACEI/ARB/spironolactone, aldactone due to ARF - EUvolemic, off diuretics at this time, cards following Filed Weights   09/04/15 0333 09/05/15 0600 09/06/15 0528  Weight: 110.768 kg (244 lb 3.2 oz) 106.822 kg (235 lb 8 oz) 110.95 kg (244 lb 9.6 oz)  - denies any chest pain or sob.  Restarted him on lasix, discussed with Dr Marlou Porch.  Restarted him on lasix 40 mg bid. He was at home on 80 mg inam and 40 mg at night.  If his renal function remains stable on 40 mg bid, can be adjusted to 80 mg in am and 40 mg at night.   Atrial flutter with RVR  - Management per cardiology - status post s/p TEE/DCCV 08/22/15, converted to NSR but then returned to flutter  - was to have ablation 7/3 but was cancelled due to GI sx - follow , scheduled another ablation for SVT and atrial flutter on 09/22/15 - continue warfarin - now in NSR, on amiodarone  Episode of SVT on 08/23/18 - required carotid massage, IV adenosine, EP following  -continue amiodarone  HTN - Not well controlled.  - prn hydralazine.   AKI on CKD Stage 3 - Continue to monitor - holding diuresis due to GI sx and poor intake - crt peaked at 5.32.  - creatinine improving and its 2 today.    Last Labs      Recent Labs Lab 08/31/15 0245 09/01/15 1238 09/02/15 0430 09/03/15 0320 09/04/15 0402 09/05/15 0543  CREATININE 1.82* 1.97* 2.03* 2.09* 2.13* 1.97*      Anemia  - due to chronic kidney disease, chronic illness, and GI bleed - Hgb has stabilized at ~10 - follow   DM  -  CBG (last 3)   Recent Labs (last 2 labs)      Recent Labs  09/05/15 2036 09/06/15 0609 09/06/15 1115  GLUCAP 71 110* 140*      Resume SSI.   Urinary retention -foley per Urology - to remain for several weeks to allow urethra to heal per Urology recommendations, DC to Rehab with foley.    Acute metabolic encephalopathy - appears to have resolved   Unstageable Pressure  Injury gluteal cleft  - Wound care following - hydrotherapy per PT  Mild protein calorie malnutrition: Nutrition consulted.        Discharge Instructions      Discharge Instructions    (HEART FAILURE PATIENTS) Call MD:  Anytime you have any of the following symptoms: 1) 3 pound weight gain in 24 hours or 5 pounds in 1 week 2) shortness of breath, with or without a dry hacking cough 3) swelling in the hands, feet or stomach 4) if you have to sleep on extra pillows at night in order to breathe.    Complete by:  As directed      Diet - low sodium heart healthy    Complete by:  As directed      Diet Carb Modified    Complete by:  As directed      Discharge instructions  Complete by:  As directed   PLEASE follow up with EP for ablation on 7/21 as scheduled.  Please follow up with cardiology in one week.  we have held your lasix for acute kidney injury , please follow up with cardiology re resuming the lasix .  Please check BMP in 2 days to check creatinine level.            Medication List    STOP taking these medications        amLODipine 10 MG tablet  Commonly known as:  NORVASC     cloNIDine 0.2 MG tablet  Commonly known as:  CATAPRES     glimepiride 2 MG tablet  Commonly known as:  AMARYL     HUMALOG KWIKPEN 100 UNIT/ML KiwkPen  Generic drug:  insulin lispro     omeprazole 20 MG capsule  Commonly known as:  PRILOSEC     TOUJEO SOLOSTAR 300 UNIT/ML Sopn  Generic drug:  Insulin Glargine  Replaced by:  insulin glargine 100 UNIT/ML injection      TAKE these medications        acetaminophen 500 MG tablet  Commonly known as:  TYLENOL  Take 1 tablet (500 mg total) by mouth every 6 (six) hours as needed for moderate pain.     allopurinol 300 MG tablet  Commonly known as:  ZYLOPRIM  Take 300 mg by mouth daily.     amiodarone 200 MG tablet  Commonly known as:  PACERONE  Take 1 tablet (200 mg total) by mouth daily.     atorvastatin 20 MG tablet  Commonly  known as:  LIPITOR  Take 20 mg by mouth daily.     colchicine 0.6 MG tablet  Take 0.6 mg by mouth daily.     collagenase ointment  Commonly known as:  SANTYL  Apply topically daily.     feeding supplement (ENSURE ENLIVE) Liqd  Take 237 mLs by mouth 3 (three) times daily between meals.     feeding supplement (PRO-STAT SUGAR FREE 64) Liqd  Take 30 mLs by mouth 3 (three) times daily between meals.     furosemide 40 MG tablet  Commonly known as:  LASIX  Take 1 tablet (40 mg total) by mouth 2 (two) times daily.     gabapentin 400 MG capsule  Commonly known as:  NEURONTIN  Take 400 mg by mouth 2 (two) times daily. Takes at noon & bedtime     hydrALAZINE 25 MG tablet  Commonly known as:  APRESOLINE  Take 3 tablets (75 mg total) by mouth every 8 (eight) hours.     HYDROcodone-acetaminophen 5-325 MG tablet  Commonly known as:  NORCO/VICODIN  Take 1 tablet by mouth every 4 (four) hours as needed for severe pain.     insulin aspart 100 UNIT/ML injection  Commonly known as:  novoLOG  Inject 0-15 Units into the skin 3 (three) times daily with meals.     insulin glargine 100 UNIT/ML injection  Commonly known as:  LANTUS  Inject 0.34 mLs (34 Units total) into the skin daily.     isosorbide mononitrate 60 MG 24 hr tablet  Commonly known as:  IMDUR  Take 1 tablet (60 mg total) by mouth daily.     lanolin ointment  Apply 1 application topically 2 (two) times daily as needed for dry skin.     LINZESS 290 MCG Caps capsule  Generic drug:  linaclotide  TAKE 1 CAPSULE ONCE DAILY 30 MINUTES BEFORE MEALS  metoprolol 100 MG tablet  Commonly known as:  LOPRESSOR  Take 100 mg by mouth 2 (two) times daily.     multivitamin with minerals Tabs tablet  Take 1 tablet by mouth daily.     oxybutynin 5 MG tablet  Commonly known as:  DITROPAN  Take 1 tablet (5 mg total) by mouth 3 (three) times daily.     pantoprazole 40 MG tablet  Commonly known as:  PROTONIX  Take 1 tablet (40 mg  total) by mouth daily.     PROAIR HFA 108 (90 Base) MCG/ACT inhaler  Generic drug:  albuterol  Inhale 2 puffs into the lungs every 6 (six) hours as needed for wheezing or shortness of breath.     senna-docusate 8.6-50 MG tablet  Commonly known as:  Senokot-S  Take 1 tablet by mouth 2 (two) times daily.     VITAMIN D PO  Take 1,000 Units by mouth daily.     zolpidem 10 MG tablet  Commonly known as:  AMBIEN  Take 10 mg by mouth at bedtime.       Follow-up Information    Follow up with Manus Rudd, MD.   Specialty:  Gastroenterology   Why:  As needed for intestinal, stomach and digestive issues.    Contact information:   [address] [place] [address] [phone]       Follow up with HAWKINS,EDWARD L, MD. Schedule an appointment as soon as possible for a visit in 1 week.   Specialty:  Pulmonary Disease   Contact information:   [place] [address] [phone]      No Known Allergies  Consultations: Cards PCCM Urology Inpatient rehabilitation Procedures/Studies: Ct Abdomen Pelvis Wo Contrast  08/16/2015  CLINICAL DATA:  65 year old male with history of abdominal distention. EXAM: CT ABDOMEN AND PELVIS WITHOUT CONTRAST TECHNIQUE: Multidetector CT imaging of the abdomen and pelvis was performed following the standard protocol without IV contrast. COMPARISON:  No priors. FINDINGS: Lower chest: Cardiomegaly. Atherosclerotic calcifications in the right coronary artery. Trace bilateral pleural effusions. Hepatobiliary: No definite cystic or solid hepatic lesions are identified within the liver on today's noncontrast CT examination. Unenhanced appearance of the gallbladder is normal. Pancreas: Atrophy in the pancreas. No definite pancreatic mass. Stranding throughout the retroperitoneum is noted, including adjacent to the pancreas. No well-defined peripancreatic fluid collections are identified. Spleen: Unremarkable. Adrenals/Urinary  Tract: Bilateral perinephric stranding (nonspecific). Unenhanced appearance of the kidneys and bilateral adrenal glands is otherwise unremarkable. No hydroureteronephrosis to indicate urinary tract obstruction at this time. Foley balloon catheter inside the urinary bladder which is nearly completely decompressed. Stomach/Bowel: Unenhanced appearance of the stomach is normal. Tip of nasogastric tube in the body of the stomach. There is extensive colonic wall thickening in the sigmoid colon and rectal wall thickening, suggestive of a distal proctocolitis. Haziness in the mesorectum and distal sigmoid mesocolon this presumably secondary to inflammation. No pathologic dilatation of small bowel or colon. Normal appendix. Vascular/Lymphatic: Atherosclerotic calcifications throughout the abdominal and pelvic vasculature, without definite aneurysm. No lymphadenopathy noted in the abdomen or pelvis. Reproductive: Prostate gland and seminal vesicles are unremarkable in appearance. Other: Small umbilical hernia containing only omental fat. Trace volume of ascites. No pneumoperitoneum. Mild diffuse body wall edema. Musculoskeletal: There are no aggressive appearing lytic or blastic lesions noted in the visualized portions of the skeleton. IMPRESSION: 1. Thickening of the sigmoid colon and rectal wall with surrounding inflammatory changes in the adjacent mesocolon/mesorectum, concerning for distal proctocolitis.  2. Trace bilateral pleural effusions, trace volume of ascites, retroperitoneal edema and diffuse mild body wall edema suggestive of a state of mild anasarca. 3. Atherosclerosis, including right coronary artery disease. Please note that although the presence of coronary artery calcium documents the presence of coronary artery disease, the severity of this disease and any potential stenosis cannot be assessed on this non-gated CT examination. Assessment for potential risk factor modification, dietary therapy or  pharmacologic therapy may be warranted, if clinically indicated. 4. Cardiomegaly. Electronically Signed   By: Vinnie Langton M.D.   On: 08/16/2015 13:45   Dg Chest 2 View  08/14/2015  CLINICAL DATA:  65 year old male with shortness of breath. EXAM: CHEST  2 VIEW COMPARISON:  Chest radiograph dated 12/02/2013 FINDINGS: The heart size and mediastinal contours are within normal limits. Both lungs are clear. The visualized skeletal structures are unremarkable. IMPRESSION: No active cardiopulmonary disease. Electronically Signed   By: Anner Crete M.D.   On: 08/14/2015 15:52   US Renal  08/15/2015  CLINICAL DATA:  Acute kidney injury. EXAM: RENAL / URINARY TRACT ULTRASOUND COMPLETE COMPARISON:  Ultrasound of June 18, 2004. FINDINGS: Right Kidney: Length: 8.5 cm. Increased echogenicity of renal parenchyma is noted. No mass or hydronephrosis visualized. Left Kidney: Length: 10 cm. Echogenicity within normal limits. No mass or hydronephrosis visualized. Bladder: Appears normal for degree of bladder distention. IMPRESSION: Mild right renal atrophy is noted with increased echogenicity of parenchyma of right kidney suggesting medical renal disease. No hydronephrosis or renal obstruction is noted. Left kidney appears normal. Electronically Signed   By: Marijo Conception, M.D.   On: 08/15/2015 12:46   Dg Chest Port 1 View  08/20/2015  CLINICAL DATA:  Patient with history of ETT. EXAM: PORTABLE CHEST 1 VIEW COMPARISON:  Chest radiograph 08/19/2015. FINDINGS: Right IJ central venous catheter tip projects over the superior vena cava. ET tube terminates in the distal trachea. Enteric tube tip and side-port project over the stomach. Low lung volumes. Stable enlarged cardiac and mediastinal contours. Interval increase left mid lower lung heterogeneous opacities with small layering left pleural effusion. Unchanged bandlike opacity right mid lung. IMPRESSION: Stable support apparatus. Interval increase heterogeneous  opacities left mid lower lung most compatible with atelectasis and small left pleural effusion. Probable right mid lung subsegmental atelectasis. Electronically Signed   By: Lovey Newcomer M.D.   On: 08/20/2015 08:22   Dg Chest Port 1 View  08/19/2015  CLINICAL DATA:  Respiratory failure. EXAM: PORTABLE CHEST 1 VIEW COMPARISON:  Radiograph of August 18, 2015. FINDINGS: Stable cardiomediastinal silhouette. Endotracheal and nasogastric tubes are unchanged in position. Right internal jugular catheter is also unchanged with tip in expected position of SVC. No pneumothorax or pleural effusion is noted. Left lung is clear. Mild right midlung subsegmental atelectasis is noted. Bony thorax is unremarkable. IMPRESSION: Stable support apparatus. Mild right midlung subsegmental atelectasis is noted. Electronically Signed   By: Marijo Conception, M.D.   On: 08/19/2015 07:35   Dg Chest Port 1 View  08/18/2015  CLINICAL DATA:  Respiratory failure EXAM: PORTABLE CHEST 1 VIEW COMPARISON:  08/17/2015 FINDINGS: Cardiac shadow is stable. An endotracheal tube and nasogastric catheter are noted in stable position. A right jugular central line is again seen. Bibasilar atelectatic changes are noted worse on the right than the left. These have increased in the interval from the prior exam. IMPRESSION: Increasing bibasilar atelectatic changes. Tubes and lines as described. Electronically Signed   By: Inez Catalina M.D.   On:  08/18/2015 06:43   Dg Chest Port 1 View  08/17/2015  CLINICAL DATA:  Respiratory failure. EXAM: PORTABLE CHEST 1 VIEW COMPARISON:  08/15/2015. FINDINGS: Endotracheal tube 3 cm above the carina on today's exam. NG tube, right IJ line stable position. Heart size normal. Right mid lung and bibasilar subsegmental atelectasis. No pleural effusion or pneumothorax. IMPRESSION: 1. Endotracheal tube 3 cm above the carina on today's exam. NG tube and right IJ line stable position. 2. Right mid lung and bibasilar subsegmental  atelectasis. Electronically Signed   By: Marcello Moores  Register   On: 08/17/2015 06:56   Dg Chest Port 1 View  08/15/2015  CLINICAL DATA:  Endotracheal tube placement and central line placement. Nasogastric tube placement. Initial encounter. EXAM: PORTABLE CHEST 1 VIEW COMPARISON:  Chest radiograph performed 08/14/2015 FINDINGS: The patient's endotracheal tube is seen ending 1-2 cm above the carina. This could be retracted 1-2 cm, as deemed clinically appropriate. An right IJ line is noted ending about the mid SVC. An enteric tube is noted ending overlying the body of the stomach. The lungs are hypoexpanded. Right perihilar airspace opacity could reflect pneumonia. No pleural effusion or pneumothorax is seen. The cardiomediastinal silhouette is borderline normal in size. No acute osseous abnormalities are identified. External pacing pads are noted. IMPRESSION: 1. Endotracheal tube seen ending 1-2 cm above the carina. This could be retracted 1-2 cm, as deemed clinically appropriate. 2. Right IJ line noted ending about the mid SVC. 3. Enteric tube seen extending overlying the body of the stomach. 4. Lungs hypoexpanded. Right perihilar airspace opacity could reflect pneumonia. Electronically Signed   By: Garald Balding M.D.   On: 08/15/2015 18:32   Dg Chest Port 1 View  08/14/2015  CLINICAL DATA:  Shortness of breath. Altered mental status. Bilateral leg edema. EXAM: PORTABLE CHEST 1 VIEW COMPARISON:  08/14/2015 FINDINGS: Examination is technically limited due to motion artifact. Shallow inspiration. Mild cardiac enlargement without vascular congestion. No focal airspace disease or consolidation in the lungs. No blunting of costophrenic angles. No pneumothorax. IMPRESSION: Shallow inspiration.  No evidence of active pulmonary disease. Electronically Signed   By: Lucienne Capers M.D.   On: 08/14/2015 23:42   Dg Abd Portable 1v  09/03/2015  CLINICAL DATA:  Abdominal pain and distension EXAM: PORTABLE ABDOMEN - 1 VIEW  COMPARISON:  08/16/2015 FINDINGS: Mildly distended large and small bowel with improvement from the prior study. Stool in the right colon. Foley catheter in the bladder. Lumbar levoscoliosis.  No acute skeletal abnormality. IMPRESSION: Mild dilatation of large and small bowel with improvement from 08/16/2015. Probable ileus. Electronically Signed   By: Franchot Gallo M.D.   On: 09/03/2015 17:06   Dg Abd Portable 1v  08/16/2015  CLINICAL DATA:  Malfunction of nasogastric tube. EXAM: PORTABLE ABDOMEN - 1 VIEW COMPARISON:  08/15/2015. FINDINGS: NG tube coiled in the stomach. Catheter noted in the pelvis. Prominently distended loops of what appear to be colon noted. Colonic ileus and/or colonic obstruction could present this fashion. Eighty sigmoid volvulus cannot be completely excluded. No free air. Follow-up exam suggested to demonstrate resolution. IMPRESSION: 1. NG tube noted coiled in the stomach. 2. Prominent dilated loops of bowel which appear to be colon noted. Colonic obstruction cannot be excluded. Sigmoid volvulus cannot be excluded. Follow-up abdominal series suggested to demonstrate resolution. Electronically Signed   By: Blevins   On: 08/16/2015 06:59   6/13 R Femoral CVL > 6/13 6/13 ETT > 6/18 6/13 Lt radial aline > 6/17 6/13 RIJ CVL >  6/19 6/25 2 units PRBC transfusion   Subjective: No nausea or vomiting.   Discharge Exam: Filed Vitals:   09/07/15 0434 09/07/15 1045  BP: 147/76 172/87  Pulse: 66 67  Temp: 98.2 F (36.8 C)   Resp: 16    Filed Vitals:   09/06/15 1143 09/06/15 2006 09/07/15 0434 09/07/15 1045  BP: 179/83 154/74 147/76 172/87  Pulse: 69 67 66 67  Temp:  98.8 F (37.1 C) 98.2 F (36.8 C)   TempSrc:  Oral Oral   Resp:  18 16   Height:      Weight:   110.315 kg (243 lb 3.2 oz)   SpO2:  99% 99%     General: Pt is alert, awake, not in acute distress Cardiovascular: RRR, S1/S2 +, no rubs, no gallops Respiratory: CTA bilaterally, no wheezing, no  rhonchi Abdominal: Soft, NT, ND, bowel sounds + Extremities: no edema, no cyanosis    The results of significant diagnostics from this hospitalization (including imaging, microbiology, ancillary and laboratory) are listed below for reference.     Microbiology: No results found for this or any previous visit (from the past 240 hour(s)).   Labs: BNP (last 3 results)  Recent Labs  08/14/15 1501  BNP 622.2*   Basic Metabolic Panel:  Recent Labs Lab 09/02/15 0430 09/03/15 0320 09/04/15 0402 09/05/15 0543 09/07/15 1211  NA 132* 133* 141 137 132*  K 4.0 3.7 3.3* 3.7 3.6  CL 100* 100* 96* 104 101  CO2 26 23 23 25 23   GLUCOSE 147* 114* 102* 111* 193*  BUN 27* 23* 23* 23* 24*  CREATININE 2.03* 2.09* 2.13* 1.97* 2.02*  CALCIUM 8.7* 9.0 9.9 9.0 8.7*  MG  --  1.7 1.7  --   --    Liver Function Tests:  Recent Labs Lab 09/06/15 1138  AST 18  ALT 17  ALKPHOS 53  BILITOT 0.7  PROT 6.0*  ALBUMIN 2.5*   No results for input(s): LIPASE, AMYLASE in the last 168 hours. No results for input(s): AMMONIA in the last 168 hours. CBC:  Recent Labs Lab 09/01/15 0607  09/02/15 0430 09/02/15 1035 09/02/15 1512 09/03/15 0320 09/04/15 0402 09/05/15 0543  WBC 4.4  --  4.5  --   --  4.2 4.3 3.6*  HGB 9.6*  < > 9.1* 9.6* 9.7* 10.0* 10.0* 10.4*  HCT 30.3*  < > 28.4* 29.7* 29.9* 31.1* 31.3* 32.6*  MCV 85.4  --  87.4  --   --  84.7 85.1 85.1  PLT 287  --  264  --   --  225 202 207  < > = values in this interval not displayed. Cardiac Enzymes: No results for input(s): CKTOTAL, CKMB, CKMBINDEX, TROPONINI in the last 168 hours. BNP: Invalid input(s): POCBNP CBG:  Recent Labs Lab 09/06/15 1115 09/06/15 1616 09/06/15 2115 09/07/15 0614 09/07/15 1121  GLUCAP 140* 166* 85 101* 219*   D-Dimer No results for input(s): DDIMER in the last 72 hours. Hgb A1c No results for input(s): HGBA1C in the last 72 hours. Lipid Profile No results for input(s): CHOL, HDL, LDLCALC, TRIG,  CHOLHDL, LDLDIRECT in the last 72 hours. Thyroid function studies No results for input(s): TSH, T4TOTAL, T3FREE, THYROIDAB in the last 72 hours.  Invalid input(s): FREET3 Anemia work up No results for input(s): VITAMINB12, FOLATE, FERRITIN, TIBC, IRON, RETICCTPCT in the last 72 hours. Urinalysis    Component Value Date/Time   COLORURINE AMBER* 08/14/2015 2125   APPEARANCEUR HAZY* 08/14/2015 2125   LABSPEC  1.015 08/14/2015 2125   PHURINE 5.0 08/14/2015 2125   GLUCOSEU 100* 08/14/2015 2125   HGBUR LARGE* 08/14/2015 2125   BILIRUBINUR NEGATIVE 08/14/2015 2125   KETONESUR NEGATIVE 08/14/2015 2125   PROTEINUR 100* 08/14/2015 2125   UROBILINOGEN 0.2 12/02/2013 1739   NITRITE NEGATIVE 08/14/2015 2125   LEUKOCYTESUR MODERATE* 08/14/2015 2125   Sepsis Labs Invalid input(s): PROCALCITONIN,  WBC,  LACTICIDVEN Microbiology No results found for this or any previous visit (from the past 240 hour(s)).   Time coordinating discharge: Over 30 minutes  SIGNED:   Hosie Poisson, MD  [facility] 09/07/2015, 1:00 PM Pager [phone]  If 7PM-7AM, please contact night-coverage [email or web address] Password TRH1

## 2015-09-07 NOTE — Progress Notes (Signed)
ANTICOAGULATION CONSULT NOTE - Follow Up Consult  Pharmacy Consult for Coumadin Indication: atrial fibrillation  No Known Allergies  Patient Measurements: Height: 5\' 6"  (167.6 cm) Weight: 243 lb 3.2 oz (110.315 kg) IBW/kg (Calculated) : 63.8 Heparin Dosing Weight: 94kg  Vital Signs: Temp: 98.2 F (36.8 C) (07/06 0434) Temp Source: Oral (07/06 0434) BP: 147/76 mmHg (07/06 0434) Pulse Rate: 66 (07/06 0434)  Labs:  Recent Labs  09/05/15 0543 09/06/15 0300 09/07/15 0500  HGB 10.Frank*  --   --   HCT 32.6*  --   --   PLT 207  --   --   LABPROT 41.Frank* 44.1* 27.6*  INR Frank.48* Frank.87* 2.62*  CREATININE 1.97*  --   --     Estimated Creatinine Clearance: 44.7 mL/min (by C-G formula based on Cr of 1.97).  . sodium chloride Stopped (09/04/15 1400)     Assessment: Frank Hoover admitted 08/14/2015 with newly diagnosed AFlutter with RVR (s/p DCCV on 6/20). Pharmacy consulted to dose coumadin. INR up to 6.36 and Vitamin K 5mg  IV given x 1 (6/24).He is noted with recent a lower GI bleed (flex sigmoidoscopy 6/23 w/ biopsy). Will keep on the lower end of goal INR (2-2.5).  INR= Frank.87 > 2.62. Hg low stable, plt wnl, no bleed documented. Will need to restart coumadin with a very low dose (consider 1mg ).  Goal of Therapy:  INR goal 2-2.5 Monitor platelets by anticoagulation protocol: Yes   Plan:  -Hold Coumadin tonight - when <2.5, would start with 1mg  and maintain -Daily INR -Monitor s/sx bleeding   Elicia Lamp, PharmD, BCPS Clinical Pharmacist Pager [phone] 09/07/2015 10:05 AM

## 2015-09-07 NOTE — Progress Notes (Signed)
Occupational Therapy Treatment Patient Details Name: Frank Hoover MRN: [id number] DOB: Feb 27, 1951 Today's Date: 09/07/2015    History of present illness 65 year old male with PMH of GERD, OSA, COPD, admitted with dizziness, tachycardia and leg edema. Dx with acute cardiogenic/septic shock, acute diastolic CHF, A-flutter, intubated 6/13-6/18. Pt underwent TEE with cardioversion 6/20. Developed significant anemia, Hemoccult-positive stool S/P Flex Sig 6/23, suspicious for ischemic colitis.   OT comments  Returned for second session today to focus UE HEP in preparation for more independent basic transfers. Pt completed with instructional cues and also able to complete basic transfers with mod assist which is significantly less assistance than previous therapy sessions. Pt is very motivated to return to PLOF. Continue to feel that CIR is the best d/c disposition as pt has great potential to return to mod I level of functioning. Will continue to follow acutely.   Follow Up Recommendations  CIR;Supervision/Assistance - 24 hour    Equipment Recommendations  Other (comment) (TBD at next venue)    Recommendations for Other Services      Precautions / Restrictions Precautions Precautions: Fall Restrictions Weight Bearing Restrictions: No       Mobility Bed Mobility Overal bed mobility: Needs Assistance Bed Mobility: Supine to Sit     Supine to sit: Min assist     General bed mobility comments: Very light min assist for trunk support.   Transfers Overall transfer level: Needs assistance Equipment used: Rolling walker (2 wheeled) Transfers: Sit to/from [facility] Sit to Stand: Mod assist Stand pivot transfers: Mod assist       General transfer comment: Assist for boost to stand and to stabilize balance upon standing. VCs for hand placement and to bring hips forward for straight upright position.     Balance Overall balance assessment: Needs  assistance Sitting-balance support: No upper extremity supported;Feet supported Sitting balance-Leahy Scale: Good     Standing balance support: Bilateral upper extremity supported;During functional activity Standing balance-Leahy Scale: Poor                     ADL                                         General ADL Comments: Focus of session on transfers and HEP.      Vision                     Perception     Praxis      Cognition   Behavior During Therapy: [place] for tasks assessed/performed Overall Cognitive Status: Within Functional Limits for tasks assessed                       Extremity/Trunk Assessment               Exercises General Exercises - Upper Extremity Shoulder Flexion: Strengthening;Both;10 reps;Seated;Theraband Theraband Level (Shoulder Flexion): Level 1 (Yellow) Shoulder ABduction: Strengthening;10 reps;Seated;Theraband Theraband Level (Shoulder Abduction): Level 1 (Yellow) Elbow Flexion: Strengthening;Both;10 reps;Seated;Theraband Theraband Level (Elbow Flexion): Level 1 (Yellow) Elbow Extension: Strengthening;Both;10 reps;Seated;Theraband Theraband Level (Elbow Extension): Level 1 (Yellow)   Shoulder Instructions       General Comments      Pertinent Vitals/ Pain       Pain Assessment: Faces Faces Pain Scale: Hurts little more Pain Location: wound site with movement Pain Descriptors /  Indicators: Sore Pain Intervention(s): Limited activity within patient's tolerance;Monitored during session;Repositioned  Home Living                                          Prior Functioning/Environment              Frequency Min 2X/week     Progress Toward Goals  OT Goals(current goals can now be found in the care plan section)  Progress towards OT goals: Progressing toward goals  Acute Rehab OT Goals Patient Stated Goal: to get stronger and go home OT Goal Formulation: With  patient Time For Goal Achievement: 09/18/15 Potential to Achieve Goals: Good ADL Goals Pt Will Perform Grooming: with min guard assist;standing Pt Will Perform Upper Body Bathing: with set-up;sitting Pt Will Perform Lower Body Bathing: with min assist;sit to/from stand Pt Will Perform Upper Body Dressing: with set-up;sitting Pt Will Perform Lower Body Dressing: with min assist;sit to/from stand Pt Will Transfer to Toilet: with min guard assist;ambulating;regular height toilet;bedside commode;grab bars Pt Will Perform Toileting - Clothing Manipulation and hygiene: with min guard assist;sit to/from stand Pt/caregiver will Perform Home Exercise Program: Increased strength;Both right and left upper extremity;With theraband;With minimal assist  Plan Discharge plan remains appropriate    Co-evaluation                 End of Session Equipment Utilized During Treatment: Gait belt;Rolling walker   Activity Tolerance Patient tolerated treatment well   Patient Left in chair;with call bell/phone within reach;with chair alarm set   Nurse Communication Mobility status        Time: [id number] OT Time Calculation (min): 16 min  Charges: OT General Charges $OT Visit: 1 Procedure OT Treatments $Therapeutic Exercise: 8-22 mins  Redmond Baseman, OTR/L Pager: [phone] 09/07/2015, 5:11 PM

## 2015-09-07 NOTE — Care Management (Addendum)
Late Entry [address] RN BSN NCM Discussed with patient that  he is medically cleared for discharge to next level of care. CIR authorization is  still pending,, SNF bed  available at [facility]. Patient and wife both are agreeable with discharge plan to SNF,. [facility] however, they are unable to admit tonight but will admit am.  CM explained and [place] letter to patient. Patient will be transferred to facility by [facility] in the am.  Family would like to be notified when patient is being transferred to the facility  [facility] (care giver) [phone]. No further CM needs identified.

## 2015-09-07 NOTE — Progress Notes (Signed)
Physical Therapy Wound Treatment Patient Details  Name: Frank Hoover MRN: [id number] Date of Birth: December 23, 1950  Today's Date: 09/07/2015 Time: 3500-9381 Time Calculation (min): 28 min  Subjective  Subjective: Pt pleasant and agreeable to therapy Patient and Family Stated Goals: Heal wound Date of Onset:  (Unknown)  Pain Score: Pt reports minimal pain throughout session. Asking for pain medication at end of session.   Wound Assessment  Pressure Ulcer 08/29/15 Unstageable - Full thickness tissue loss in which the base of the ulcer is covered by slough (yellow, tan, gray, green or brown) and/or eschar (tan, brown or black) in the wound bed. 100% yellow-per staff started as blister (Active)  Dressing Type ABD;Barrier Film (skin prep);Gauze (Comment);Moist to dry 09/07/2015 10:31 AM  Dressing Changed;Clean;Dry;Intact 09/07/2015 10:31 AM  Dressing Change Frequency Daily 09/07/2015 10:31 AM  State of Healing Eschar 09/07/2015 10:31 AM  Site / Wound Assessment Yellow;Pink;Black 09/07/2015 10:31 AM  % Wound base Red or Granulating 40% 09/07/2015 10:31 AM  % Wound base Yellow 60% 09/07/2015 10:31 AM  % Wound base Black 0% 09/07/2015 10:31 AM  % Wound base Other (Comment) 0% 09/07/2015 10:31 AM  Peri-wound Assessment Intact 09/07/2015 10:31 AM  Wound Length (cm) 11.8 cm 09/06/2015  1:05 PM  Wound Width (cm) 7.2 cm 09/06/2015  1:05 PM  Wound Depth (cm) 1.2 cm 09/06/2015  1:05 PM  Margins Unattached edges (unapproximated) 09/07/2015 10:31 AM  Drainage Amount Minimal 09/07/2015 10:31 AM  Drainage Description Purulent;Green;Odor 09/07/2015 10:31 AM  Treatment Debridement (Selective);Hydrotherapy (Pulse lavage);Packing (Saline gauze) 09/07/2015 10:31 AM  Santyl applied to wound bed prior to applying dressing.   Hydrotherapy Pulsed lavage therapy - wound location: Gluteal cleft Pulsed Lavage with Suction (psi): 12 psi Pulsed Lavage with Suction - Normal Saline Used: 1000 mL Pulsed Lavage Tip: Tip with splash  shield Selective Debridement Selective Debridement - Location: Gluteal cleft Selective Debridement - Tools Used: Forceps;Scissors Selective Debridement - Tissue Removed: yellow necrotic tissue   Wound Assessment and Plan  Wound Therapy - Assess/Plan/Recommendations Wound Therapy - Clinical Statement: Continued slow, steady progress with the removal of necrotic tissue. Wound Therapy - Functional Problem List: Decreased tolerance of OOB, acute pain.  Factors Delaying/Impairing Wound Healing: Altered sensation;Diabetes Mellitus;Immobility;Multiple medical problems Hydrotherapy Plan: Debridement;Dressing change;Patient/family education;Pulsatile lavage with suction Wound Therapy - Frequency: 6X / week Wound Therapy - Follow Up Recommendations: Other (comment) (CIR) Wound Plan: See above  Wound Therapy Goals- Improve the function of patient's integumentary system by progressing the wound(s) through the phases of wound healing (inflammation - proliferation - remodeling) by: Decrease Necrotic Tissue to: 25% Decrease Necrotic Tissue - Progress: Progressing toward goal Increase Granulation Tissue to: 75% Increase Granulation Tissue - Progress: Progressing toward goal Goals/treatment plan/discharge plan were made with and agreed upon by patient/family: Yes Time For Goal Achievement: 7 days Wound Therapy - Potential for Goals: Good  Goals will be updated until maximal potential achieved or discharge criteria met.  Discharge criteria: when goals achieved, discharge from hospital, MD decision/surgical intervention, no progress towards goals, refusal/missing three consecutive treatments without notification or medical reason.  GP     Rolinda Roan 09/07/2015, 10:35 AM   Rolinda Roan, PT, DPT Acute Rehabilitation Services Pager: [phone]

## 2015-09-07 NOTE — Clinical Social Work Placement (Signed)
   CLINICAL SOCIAL WORK PLACEMENT  NOTE  Date:  09/07/2015  Patient Details  Name: Frank Hoover MRN: [id number] Date of Birth: December 10, 1950  Clinical Social Work is seeking post-discharge placement for this patient at the [place] level of care (*CSW will initial, date and re-position this form in  chart as items are completed):  Yes   Patient/family provided with [place] Work Department's list of facilities offering this level of care within the geographic area requested by the patient (or if unable, by the patient's family).  Yes   Patient/family informed of their freedom to choose among providers that offer the needed level of care, that participate in Medicare, Medicaid or managed care program needed by the patient, have an available bed and are willing to accept the patient.  Yes   Patient/family informed of 's ownership interest in [facility] and [facility], as well as of the fact that they are under no obligation to receive care at these facilities.  PASRR submitted to EDS on 08/27/15     PASRR number received on 08/27/15     Existing PASRR number confirmed on       FL2 transmitted to all facilities in geographic area requested by pt/family on 08/27/15     FL2 transmitted to all facilities within larger geographic area on       Patient informed that his/her managed care company has contracts with or will negotiate with certain facilities, including the following:            Patient/family informed of bed offers received.  Patient chooses bed at [facility]     Physician recommends and patient chooses bed at      Patient to be transferred to [facility] on 09/07/15.  Patient to be transferred to facility by Ambulance     Patient family notified on 09/07/15 of transfer.  Name of family member notified:  [name],[name]     PHYSICIAN Please prepare priority discharge summary, including medications, Please  prepare prescriptions, Please sign FL2     Additional Comment:  Per MD patient is ready to discharge to [facility]. RN, patient, patient's family, and facility notified of discharge. [name] reported she would go to [facility] to sign in patient. CSW spoke with [place] with admissions. She reported that patient can sign himself in if he arrives before his family. RN given phone number for report and transport packet is on patient's chart. RN to call transport once patient is ready for discharge. CSW signing off.   _______________________________________________ Samule Dry, LCSW 09/07/2015, 4:57 PM

## 2015-09-07 NOTE — Progress Notes (Signed)
Physical Therapy Treatment Patient Details Name: AAMARI BENDANA MRN: [id number] DOB: 07-26-1950 Today's Date: 09/07/2015    History of Present Illness 65 year old male with PMH of GERD, OSA, COPD, admitted with dizziness, tachycardia and leg edema. Dx with acute cardiogenic/septic shock, acute diastolic CHF, A-flutter, intubated 6/13-6/18. Pt underwent TEE with cardioversion 6/20. Developed significant anemia, Hemoccult-positive stool S/P Flex Sig 6/23, suspicious for ischemic colitis.    PT Comments    Family reports pt is very private and will need extra encouragement for hallway ambulation.  Follow Up Recommendations  CIR     Equipment Recommendations  Other (comment) (TBD at next venue)    Recommendations for Other Services       Precautions / Restrictions Precautions Precautions: Fall    Mobility  Bed Mobility         Supine to sit: Min assist;HOB elevated Sit to supine: Min assist   General bed mobility comments: +rail, assist with BLE, verbal cues for sequencing  Transfers   Equipment used: Rolling walker (2 wheeled)   Sit to Stand: Mod assist;+2 safety/equipment         General transfer comment: verbal cues for hand placement, assist to power up  Ambulation/Gait Ambulation/Gait assistance: Min assist;+2 safety/equipment Ambulation Distance (Feet): 10 Feet (x 3) Assistive device: Rolling walker (2 wheeled) Gait Pattern/deviations: Step-through pattern;Decreased stride length Gait velocity: decreased Gait velocity interpretation: Below normal speed for age/gender General Gait Details: Decreased hip and knee flexion bilat during ambulation. Pt fatigues quickly required 1 minute seated rest break after 10 feet. Pt able to ambulation 3 trials of 10 feet each. Pt hesitant to ambulate in the hallway. Encouragement needed.   Stairs            Wheelchair Mobility    Modified Rankin (Stroke Patients Only)       Balance     Sitting  balance-Leahy Scale: Good Sitting balance - Comments: Pt able to maintain EOB sitting with supervision.   Standing balance support: During functional activity;Bilateral upper extremity supported Standing balance-Leahy Scale: Poor Standing balance comment: Relies heavily on BUE during ambulation with RW.                    Cognition Arousal/Alertness: Awake/alert Behavior During Therapy: Flat affect Overall Cognitive Status: Impaired/Different from baseline Area of Impairment: Problem solving;Safety/judgement;Attention;Following commands   Current Attention Level: Selective Memory: Decreased short-term memory Following Commands: Follows one step commands consistently Safety/Judgement: Decreased awareness of safety   Problem Solving: Requires verbal cues;Difficulty sequencing      Exercises      General Comments        Pertinent Vitals/Pain Pain Assessment: Faces Faces Pain Scale: No hurt    Home Living                      Prior Function            PT Goals (current goals can now be found in the care plan section) Acute Rehab PT Goals Patient Stated Goal: home PT Goal Formulation: With patient Time For Goal Achievement: 09/11/15 Potential to Achieve Goals: Good Progress towards PT goals: Progressing toward goals    Frequency  Min 3X/week    PT Plan Current plan remains appropriate    Co-evaluation PT/OT/SLP Co-Evaluation/Treatment: Yes Reason for Co-Treatment: Complexity of the patient's impairments (multi-system involvement);For patient/therapist safety PT goals addressed during session: Mobility/safety with mobility;Balance       End of Session Equipment Utilized  During Treatment: Gait belt Activity Tolerance: Patient tolerated treatment well Patient left: with family/visitor present;with call bell/phone within reach     Time: 0838-0901 PT Time Calculation (min) (ACUTE ONLY): 23 min  Charges:  $Gait Training: 8-22 mins                     G Codes:      Lorriane Shire 09/07/2015, 9:50 AM

## 2015-09-07 NOTE — Progress Notes (Signed)
CSW called [place] re: pt's d/c to their facility, as pt agreeable to this plan.  Per Levada Dy at [facility], who confirmed plan with their Admission's Coordinator, they will not be able to accept pt until 09/08/15 after admission paperwork is completed.  Unit CSW to f/u in am.

## 2015-09-07 NOTE — Care Management Important Message (Signed)
Important Message  Patient Details  Name: Frank Hoover MRN: [id number] Date of Birth: November 16, 1950   Medicare Important Message Given:  Yes    [name], Leroy Sea 09/07/2015, 10:30 AM

## 2015-09-07 NOTE — Care Management Note (Addendum)
Case Management Note  Patient Details  Name: Frank Hoover MRN: [id number] Date of Birth: September 12, 1950  Subjective/Objective:                    Action/Plan: Anticipate discharge  today. No further CM needs but will be available should additional discharge needs arise.   Expected Discharge Date:                  Expected Discharge Plan:     In-House Referral:     Discharge planning Services     Post Acute Care Choice:    Choice offered to:     DME Arranged:    DME Agency:     HH Arranged:    [place] Agency:     Status of Service:     If discussed at [place] of Stay Meetings, dates discussed:  09/07/2015  Additional Comments:  Delrae Sawyers, RN 09/07/2015, 11:38 AM

## 2015-09-07 NOTE — Progress Notes (Signed)
Patient is refusing the use of CPAP for tonight. RT educated patient and informed patient if he changes his mind have RN contact RT.

## 2015-09-07 NOTE — Progress Notes (Signed)
Inpatient Rehabilitation  Received and sent updated OT note.  Continue to await insurance authorization.  Updated RN CM.  Carmelia Roller., CCC/SLP Admission Coordinator  [place]  Cell [phone]

## 2015-09-08 DIAGNOSIS — Z6835 Body mass index (BMI) 35.0-35.9, adult: Secondary | ICD-10-CM | POA: Diagnosis not present

## 2015-09-08 DIAGNOSIS — G9340 Encephalopathy, unspecified: Secondary | ICD-10-CM | POA: Diagnosis not present

## 2015-09-08 DIAGNOSIS — I499 Cardiac arrhythmia, unspecified: Secondary | ICD-10-CM | POA: Diagnosis not present

## 2015-09-08 DIAGNOSIS — N3281 Overactive bladder: Secondary | ICD-10-CM | POA: Diagnosis not present

## 2015-09-08 DIAGNOSIS — I4892 Unspecified atrial flutter: Secondary | ICD-10-CM | POA: Diagnosis not present

## 2015-09-08 DIAGNOSIS — E7800 Pure hypercholesterolemia, unspecified: Secondary | ICD-10-CM | POA: Diagnosis not present

## 2015-09-08 DIAGNOSIS — I509 Heart failure, unspecified: Secondary | ICD-10-CM | POA: Diagnosis not present

## 2015-09-08 DIAGNOSIS — R5381 Other malaise: Secondary | ICD-10-CM | POA: Diagnosis not present

## 2015-09-08 DIAGNOSIS — K922 Gastrointestinal hemorrhage, unspecified: Secondary | ICD-10-CM | POA: Diagnosis not present

## 2015-09-08 DIAGNOSIS — M1990 Unspecified osteoarthritis, unspecified site: Secondary | ICD-10-CM | POA: Diagnosis not present

## 2015-09-08 DIAGNOSIS — I429 Cardiomyopathy, unspecified: Secondary | ICD-10-CM | POA: Diagnosis not present

## 2015-09-08 DIAGNOSIS — R0902 Hypoxemia: Secondary | ICD-10-CM | POA: Diagnosis not present

## 2015-09-08 DIAGNOSIS — K529 Noninfective gastroenteritis and colitis, unspecified: Secondary | ICD-10-CM | POA: Diagnosis not present

## 2015-09-08 DIAGNOSIS — E1169 Type 2 diabetes mellitus with other specified complication: Secondary | ICD-10-CM | POA: Diagnosis not present

## 2015-09-08 DIAGNOSIS — N183 Chronic kidney disease, stage 3 (moderate): Secondary | ICD-10-CM | POA: Diagnosis not present

## 2015-09-08 DIAGNOSIS — I10 Essential (primary) hypertension: Secondary | ICD-10-CM | POA: Diagnosis not present

## 2015-09-08 DIAGNOSIS — I5021 Acute systolic (congestive) heart failure: Secondary | ICD-10-CM | POA: Diagnosis not present

## 2015-09-08 DIAGNOSIS — G9341 Metabolic encephalopathy: Secondary | ICD-10-CM | POA: Diagnosis not present

## 2015-09-08 DIAGNOSIS — I471 Supraventricular tachycardia: Secondary | ICD-10-CM | POA: Diagnosis not present

## 2015-09-08 DIAGNOSIS — J189 Pneumonia, unspecified organism: Secondary | ICD-10-CM | POA: Diagnosis not present

## 2015-09-08 DIAGNOSIS — M6281 Muscle weakness (generalized): Secondary | ICD-10-CM | POA: Diagnosis not present

## 2015-09-08 DIAGNOSIS — Q211 Atrial septal defect: Secondary | ICD-10-CM | POA: Diagnosis not present

## 2015-09-08 DIAGNOSIS — Z79899 Other long term (current) drug therapy: Secondary | ICD-10-CM | POA: Diagnosis not present

## 2015-09-08 DIAGNOSIS — E119 Type 2 diabetes mellitus without complications: Secondary | ICD-10-CM | POA: Diagnosis not present

## 2015-09-08 DIAGNOSIS — E669 Obesity, unspecified: Secondary | ICD-10-CM | POA: Diagnosis not present

## 2015-09-08 DIAGNOSIS — R7881 Bacteremia: Secondary | ICD-10-CM | POA: Diagnosis not present

## 2015-09-08 DIAGNOSIS — I519 Heart disease, unspecified: Secondary | ICD-10-CM | POA: Diagnosis not present

## 2015-09-08 DIAGNOSIS — G4730 Sleep apnea, unspecified: Secondary | ICD-10-CM | POA: Diagnosis not present

## 2015-09-08 DIAGNOSIS — E785 Hyperlipidemia, unspecified: Secondary | ICD-10-CM | POA: Diagnosis not present

## 2015-09-08 DIAGNOSIS — I130 Hypertensive heart and chronic kidney disease with heart failure and stage 1 through stage 4 chronic kidney disease, or unspecified chronic kidney disease: Secondary | ICD-10-CM | POA: Diagnosis not present

## 2015-09-08 DIAGNOSIS — J9600 Acute respiratory failure, unspecified whether with hypoxia or hypercapnia: Secondary | ICD-10-CM | POA: Diagnosis not present

## 2015-09-08 DIAGNOSIS — R197 Diarrhea, unspecified: Secondary | ICD-10-CM | POA: Diagnosis not present

## 2015-09-08 DIAGNOSIS — Z87891 Personal history of nicotine dependence: Secondary | ICD-10-CM | POA: Diagnosis not present

## 2015-09-08 DIAGNOSIS — D649 Anemia, unspecified: Secondary | ICD-10-CM | POA: Diagnosis not present

## 2015-09-08 DIAGNOSIS — J449 Chronic obstructive pulmonary disease, unspecified: Secondary | ICD-10-CM | POA: Diagnosis not present

## 2015-09-08 DIAGNOSIS — K921 Melena: Secondary | ICD-10-CM | POA: Diagnosis not present

## 2015-09-08 DIAGNOSIS — N179 Acute kidney failure, unspecified: Secondary | ICD-10-CM | POA: Diagnosis not present

## 2015-09-08 DIAGNOSIS — G4733 Obstructive sleep apnea (adult) (pediatric): Secondary | ICD-10-CM | POA: Diagnosis not present

## 2015-09-08 DIAGNOSIS — E1143 Type 2 diabetes mellitus with diabetic autonomic (poly)neuropathy: Secondary | ICD-10-CM | POA: Diagnosis not present

## 2015-09-08 DIAGNOSIS — R2681 Unsteadiness on feet: Secondary | ICD-10-CM | POA: Diagnosis not present

## 2015-09-08 DIAGNOSIS — E039 Hypothyroidism, unspecified: Secondary | ICD-10-CM | POA: Diagnosis not present

## 2015-09-08 DIAGNOSIS — R6521 Severe sepsis with septic shock: Secondary | ICD-10-CM | POA: Diagnosis not present

## 2015-09-08 DIAGNOSIS — E1122 Type 2 diabetes mellitus with diabetic chronic kidney disease: Secondary | ICD-10-CM | POA: Diagnosis not present

## 2015-09-08 DIAGNOSIS — K219 Gastro-esophageal reflux disease without esophagitis: Secondary | ICD-10-CM | POA: Diagnosis not present

## 2015-09-08 DIAGNOSIS — K559 Vascular disorder of intestine, unspecified: Secondary | ICD-10-CM | POA: Diagnosis not present

## 2015-09-08 DIAGNOSIS — Z23 Encounter for immunization: Secondary | ICD-10-CM | POA: Diagnosis not present

## 2015-09-08 DIAGNOSIS — M109 Gout, unspecified: Secondary | ICD-10-CM | POA: Diagnosis not present

## 2015-09-08 DIAGNOSIS — I481 Persistent atrial fibrillation: Secondary | ICD-10-CM | POA: Diagnosis not present

## 2015-09-08 DIAGNOSIS — N365 Urethral false passage: Secondary | ICD-10-CM | POA: Diagnosis not present

## 2015-09-08 LAB — GLUCOSE, CAPILLARY
Glucose-Capillary: 82 mg/dL (ref 65–99)
Glucose-Capillary: 109 mg/dL — ABNORMAL HIGH (ref 65–99)

## 2015-09-08 LAB — PROTIME-INR
INR: 2.21 — ABNORMAL HIGH (ref 0.00–1.49)
Prothrombin Time: 24.3 seconds — ABNORMAL HIGH (ref 11.6–15.2)

## 2015-09-08 MED ORDER — WARFARIN SODIUM 1 MG PO TABS: 1.0000 mg | ORAL_TABLET | Freq: Every day | ORAL | Status: DC

## 2015-09-08 MED ORDER — WARFARIN SODIUM 1 MG PO TABS: 1.0000 mg | ORAL_TABLET | Freq: Once | ORAL | Status: DC

## 2015-09-08 MED ORDER — HYDROCODONE-ACETAMINOPHEN 5-325 MG PO TABS: 1.0000 | ORAL_TABLET | Freq: Four times a day (QID) | ORAL | Status: DC | PRN

## 2015-09-08 NOTE — Progress Notes (Signed)
Inpatient Rehabilitation  Called by CWS yesterday around 4pm, stating that patient and family had decided to go to [facility] [place] upon discharge from hospital.  At 5pm I received a call back from insurance stating that IP Rehab was denied.  Plan to sign off at this time.  Please call with questions.    Carmelia Roller., CCC/SLP Admission Coordinator  [place]  Cell [phone]

## 2015-09-08 NOTE — Progress Notes (Signed)
Physical Therapy Wound Treatment Patient Details  Name: ZORIAN GUNDERMAN MRN: [id number] Date of Birth: 11-07-1950  Today's Date: 09/08/2015 Time: 0920-0945 Time Calculation (min): 25 min  Subjective  Subjective: Pt pleasant and agreeable to therapy Patient and Family Stated Goals: Heal wound Date of Onset:  (Unknown)  Pain Score: Pt premedicated prior to session and reports minimal pain during session.   Wound Assessment  Pressure Ulcer 08/29/15 Unstageable - Full thickness tissue loss in which the base of the ulcer is covered by slough (yellow, tan, gray, green or brown) and/or eschar (tan, brown or black) in the wound bed. 100% yellow-per staff started as blister (Active)  Dressing Type ABD;Barrier Film (skin prep);Gauze (Comment);Moist to dry 09/08/2015  9:46 AM  Dressing Clean;Dry;Intact 09/08/2015  9:46 AM  Dressing Change Frequency Daily 09/08/2015  9:46 AM  State of Healing Eschar 09/08/2015  9:46 AM  Site / Wound Assessment Pink;Yellow 09/08/2015  9:46 AM  % Wound base Red or Granulating 40% 09/08/2015  9:46 AM  % Wound base Yellow 60% 09/08/2015  9:46 AM  % Wound base Black 0% 09/08/2015  9:46 AM  % Wound base Other (Comment) 0% 09/08/2015  9:46 AM  Peri-wound Assessment Intact 09/08/2015  9:46 AM  Wound Length (cm) 11.8 cm 09/06/2015  1:05 PM  Wound Width (cm) 7.2 cm 09/06/2015  1:05 PM  Wound Depth (cm) 1.2 cm 09/06/2015  1:05 PM  Margins Unattached edges (unapproximated) 09/08/2015  9:46 AM  Drainage Amount Minimal 09/08/2015  9:46 AM  Drainage Description Purulent;Green;Odor 09/08/2015  9:46 AM  Treatment Debridement (Selective);Hydrotherapy (Pulse lavage);Packing (Saline gauze) 09/08/2015  9:46 AM  Santyl applied to wound bed prior to applying dressing.   Hydrotherapy Pulsed lavage therapy - wound location: Gluteal cleft Pulsed Lavage with Suction (psi): 12 psi Pulsed Lavage with Suction - Normal Saline Used: 1000 mL Pulsed Lavage Tip: Tip with splash shield Selective Debridement Selective  Debridement - Location: Gluteal cleft Selective Debridement - Tools Used: Forceps;Scissors Selective Debridement - Tissue Removed: yellow necrotic tissue   Wound Assessment and Plan  Wound Therapy - Assess/Plan/Recommendations Wound Therapy - Clinical Statement: Continued slow, steady progress with the removal of necrotic tissue. Wound Therapy - Functional Problem List: Decreased tolerance of OOB, acute pain.  Factors Delaying/Impairing Wound Healing: Altered sensation;Diabetes Mellitus;Immobility;Multiple medical problems Hydrotherapy Plan: Debridement;Dressing change;Patient/family education;Pulsatile lavage with suction Wound Therapy - Frequency: 6X / week Wound Therapy - Follow Up Recommendations: Other (comment) (CIR) Wound Plan: See above  Wound Therapy Goals- Improve the function of patient's integumentary system by progressing the wound(s) through the phases of wound healing (inflammation - proliferation - remodeling) by: Decrease Necrotic Tissue to: 25% Decrease Necrotic Tissue - Progress: Progressing toward goal Increase Granulation Tissue to: 75% Increase Granulation Tissue - Progress: Progressing toward goal Goals/treatment plan/discharge plan were made with and agreed upon by patient/family: Yes Time For Goal Achievement: 7 days Wound Therapy - Potential for Goals: Good  Goals will be updated until maximal potential achieved or discharge criteria met.  Discharge criteria: when goals achieved, discharge from hospital, MD decision/surgical intervention, no progress towards goals, refusal/missing three consecutive treatments without notification or medical reason.  GP     Rolinda Roan 09/08/2015, 9:50 AM   Rolinda Roan, PT, DPT Acute Rehabilitation Services Pager: [phone]

## 2015-09-08 NOTE — Progress Notes (Signed)
Patient had been discharged and accepted at [facility]. Report was given to [facility] and then the patient refused to go there. Case Management has been made aware and is working on it.

## 2015-09-08 NOTE — Progress Notes (Signed)
Report has been given to the nurse at [facility].

## 2015-09-08 NOTE — Progress Notes (Signed)
ANTICOAGULATION CONSULT NOTE - Follow Up Consult  Pharmacy Consult for Coumadin Indication: atrial fibrillation  No Known Allergies  Patient Measurements: Height: 5\' 6"  (167.6 cm) Weight: 241 lb 12.8 oz (109.68 kg) IBW/kg (Calculated) : 63.8 Heparin Dosing Weight: 94kg  Vital Signs: Temp: 98.3 F (36.8 C) (07/07 0518) Temp Source: Oral (07/07 0518) BP: 160/70 mmHg (07/07 1016) Pulse Rate: 60 (07/07 1016)  Labs:  Recent Labs  09/06/15 0300 09/07/15 0500 09/07/15 1211 09/08/15 0509  LABPROT 44.1* 27.6*  --  24.3*  INR 4.87* 2.62*  --  2.21*  CREATININE  --   --  2.02*  --     Estimated Creatinine Clearance: 43.5 mL/min (by C-G formula based on Cr of 2.02).  . sodium chloride Stopped (09/04/15 1400)     Assessment: 75 YOM admitted 08/14/2015 with newly diagnosed AFlutter with RVR (s/p DCCV on 6/20). Pharmacy consulted to dose coumadin. INR up to 6.36 and Vitamin K 5mg  IV given x 1 (6/24).He was noted with a lower GI bleed (flex sigmoidoscopy 6/23 w/ biopsy). Will keep on the lower end of goal INR (2-2.5).  INR today 2.21. Hgb low stable, plt wnl, no bleed documented. Will restart coumadin at 1mg . Pt appears to be very sensitive to coumadin, any dose over 2.5mg  causes quick jump.   Goal of Therapy:  INR goal 2-2.5 Monitor platelets by anticoagulation protocol: Yes   Plan:  -Coumadin 1mg  tonight  -Daily INR -Monitor s/sx bleeding   Gwenlyn Perking, PharmD PGY1 Pharmacy Resident Pager: [phone]  09/08/2015 11:39 AM

## 2015-09-08 NOTE — Clinical Social Work Note (Signed)
Patient to be discharged to [facility]. Per RN and RNCM patient now agreeable to SNF placement. RN provided with report number. Patient to be transported via EMS.  Lubertha Sayres, [facility]: [phone] Surgical: [phone]

## 2015-09-08 NOTE — Care Management Note (Signed)
Case Management Note  Patient Details  Name: Frank Hoover MRN: [id number] Date of Birth: January 21, 1951  Subjective/Objective:                    Action/Plan:  Spoke with Dr Karleen Hampshire, updated that insurance denied CIR. Plan is for DC today. Spoke with patient at the bedside. He was updated that CIR was no longer an option for DC, explained that insurance did not approve it. Patient verbalized understanding. Patient verbalized understanding that he would be discharged from the hospital today. Patient stated he was agreeable to go to [facility] today. Raquel Sarna CSW updated on DC plan. Expected Discharge Date:                  Expected Discharge Plan:  [employer] Nanine Means)  In-House Referral:  Clinical Social Work  Discharge planning Services  CM Consult  Post Acute Care Choice:    Choice offered to:     DME Arranged:    DME Agency:     HH Arranged:    [place] Agency:     Status of Service:  Completed, signed off  If discussed at [employer], dates discussed:    Additional Comments:  Carles Collet, RN 09/08/2015, 10:40 AM

## 2015-09-10 NOTE — Progress Notes (Signed)
Patient discharged to [facility] by ems. IV was dc'd and was intact. Report was given to the nurse at the facility. Wife and caregiver were here at discharge. Foley was in place upon discharge.

## 2015-09-14 ENCOUNTER — Telehealth: Payer: Self-pay | Admitting: Nurse Practitioner

## 2015-09-14 NOTE — Telephone Encounter (Signed)
Ablation rescheduled to 09/27/15 with Dr Lovena Le. Pt to arrive at [facility] at [place] after midnight night before.  Discussed with RN and transportation at [facility]. They stated they will relay message to patient.   Chanetta Marshall, NP 09/14/2015 8:30 AM

## 2015-09-18 DIAGNOSIS — I10 Essential (primary) hypertension: Secondary | ICD-10-CM | POA: Diagnosis not present

## 2015-09-18 DIAGNOSIS — E119 Type 2 diabetes mellitus without complications: Secondary | ICD-10-CM | POA: Diagnosis not present

## 2015-09-18 DIAGNOSIS — I4892 Unspecified atrial flutter: Secondary | ICD-10-CM | POA: Diagnosis not present

## 2015-09-18 DIAGNOSIS — G4730 Sleep apnea, unspecified: Secondary | ICD-10-CM | POA: Diagnosis not present

## 2015-09-18 DIAGNOSIS — K219 Gastro-esophageal reflux disease without esophagitis: Secondary | ICD-10-CM | POA: Diagnosis not present

## 2015-09-19 ENCOUNTER — Ambulatory Visit (INDEPENDENT_AMBULATORY_CARE_PROVIDER_SITE_OTHER): Payer: Medicare Other | Admitting: Adult Health

## 2015-09-19 ENCOUNTER — Encounter: Payer: Self-pay | Admitting: Adult Health

## 2015-09-19 VITALS — BP 134/72 | HR 96 | Ht 66.0 in | Wt 223.0 lb

## 2015-09-19 DIAGNOSIS — I5189 Other ill-defined heart diseases: Secondary | ICD-10-CM

## 2015-09-19 DIAGNOSIS — I481 Persistent atrial fibrillation: Secondary | ICD-10-CM

## 2015-09-19 DIAGNOSIS — I10 Essential (primary) hypertension: Secondary | ICD-10-CM

## 2015-09-19 DIAGNOSIS — I519 Heart disease, unspecified: Secondary | ICD-10-CM

## 2015-09-19 DIAGNOSIS — I4819 Other persistent atrial fibrillation: Secondary | ICD-10-CM

## 2015-09-19 NOTE — Progress Notes (Signed)
Cardiology Office Note   Date:  09/19/2015   ID:  Frank Hoover, DOB [birth year], MRN [id number]  PCP:  Alonza Bogus, MD  Cardiologist: Bryna Colander, NP   No chief complaint on file.     History of Present Illness: Frank Hoover is a 65 y.o. male who presents for ongoing assessment and management of atrial flutter and cardiomyopathy. Recently discharged from [facility] after admission for Afib RVR, sepsis, cardiogenic shock, and treated for bacteremia due to UTI after being transferred to [facility]. He required intubation. He was seen by Dr. Lovena Le and was converted to NSR after TEE DCCV, but reverted back to atrial fib. He began to have bleeding on anticoagulation from rectum. 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 He is schedule for SVT ablation on 09/22/2015.  He was not placed on anticoagulation on discharge.    Mild LVH with LVEF approximately 25%, diffuse hypokinesis with  probable akinesis of the inferior septum. Left ventricular  dysfunction is new in comparison to the previous study from 2015.  Study telemetry looks to show atrial flutter with RVR.  Indeterminate diastolic function. Mild left atrial enlargement.  Trivial mitral regurgitation. Moderately reduced RV contraction.  Mild tricuspid regurgitation with PASP 31 mmHg. 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  He is without complaints today, despite his recent medical history. He is at [facility] for physical therapy.  He states he has lost his appetite.   Past Medical History  Diagnosis Date  . Gout   . COPD (chronic obstructive pulmonary disease) ([place])   . Type 2 diabetes mellitus ([place])   . GERD (gastroesophageal reflux disease)   . Essential hypertension   . Hyperlipemia   . OA (osteoarthritis)   . Gastroparesis   . Adenomatous colon polyp 10/30/09    Serrated adenoma removed during Colonoscopy   . Diverticulosis of colon   . CKD (chronic kidney  disease) stage 3, GFR 30-59 ml/min     Past Surgical History  Procedure Laterality Date  . Tonsillectomy    . Cataract extraction Right   . Esophagogastroduodenoscopy  10/30/09    GYF:VCBSWH   . Colonoscopy  10/30/09    QPR:FFMB-WGYKZ diverticulum/serrated adenoma from ICV, next colonoscopy due 10/2012  . Colonoscopy N/A 09/18/2012    LDJ:TTSVXBL mucosa that was seen appeared normal, however most of it was not seen due to be very poor prep  . Colonoscopy N/A 04/08/2013    TJQ:ZESPQZR polyp removed/inadequate preparation  . Colonoscopy N/A 06/22/2014    Procedure: COLONOSCOPY;  Surgeon: Daneil Dolin, MD;  [facility];  Service: Endoscopy;  Laterality: N/A;  115   . Tee without cardioversion N/A 08/22/2015    Procedure: TRANSESOPHAGEAL ECHOCARDIOGRAM (TEE);  Surgeon: Larey Dresser, MD;  Location: [facility];  Service: Cardiovascular;  Laterality: N/A;  . Cardioversion N/A 08/22/2015    Procedure: CARDIOVERSION;  Surgeon: Larey Dresser, MD;  Location: [facility];  Service: Cardiovascular;  Laterality: N/A;  . Flexible sigmoidoscopy N/A 08/25/2015    Procedure: FLEXIBLE SIGMOIDOSCOPY;  Surgeon: Jerene Bears, MD;  Location: [facility] ENDOSCOPY;  Service: Endoscopy;  Laterality: N/A;  . Cardioversion N/A 09/01/2015    Procedure: CARDIOVERSION;  Surgeon: Lelon Perla, MD;  Location: [facility] ENDOSCOPY;  Service: Cardiovascular;  Laterality: N/A;     Current Outpatient Prescriptions  Medication Sig Dispense Refill  . acetaminophen (TYLENOL) 500 MG tablet Take 1 tablet (500 mg total) by mouth every 6 (six) hours as  needed for moderate pain. 30 tablet 0  . albuterol (PROAIR HFA) 108 (90 BASE) MCG/ACT inhaler Inhale 2 puffs into the lungs every 6 (six) hours as needed for wheezing or shortness of breath.     . allopurinol (ZYLOPRIM) 300 MG tablet Take 300 mg by mouth daily.      . Amino Acids-Protein Hydrolys (FEEDING SUPPLEMENT, PRO-STAT SUGAR FREE 64,) LIQD Take 30 mLs by mouth 3 (three)  times daily between meals. 900 mL 0  . amiodarone (PACERONE) 200 MG tablet Take 1 tablet (200 mg total) by mouth daily.    Marland Kitchen atorvastatin (LIPITOR) 20 MG tablet Take 20 mg by mouth daily.    . Cholecalciferol (VITAMIN D PO) Take 1,000 Units by mouth daily.    . colchicine 0.6 MG tablet Take 0.6 mg by mouth daily.    . collagenase (SANTYL) ointment Apply topically daily. 15 g 0  . feeding supplement, ENSURE ENLIVE, (ENSURE ENLIVE) LIQD Take 237 mLs by mouth 3 (three) times daily between meals. 237 mL 12  . furosemide (LASIX) 40 MG tablet Take 1 tablet (40 mg total) by mouth 2 (two) times daily. 30 tablet   . gabapentin (NEURONTIN) 400 MG capsule Take 400 mg by mouth 2 (two) times daily. Takes at noon & bedtime    . hydrALAZINE (APRESOLINE) 25 MG tablet Take 3 tablets (75 mg total) by mouth every 8 (eight) hours.    Marland Kitchen HYDROcodone-acetaminophen (NORCO/VICODIN) 5-325 MG tablet Take 1 tablet by mouth every 6 (six) hours as needed for severe pain. 10 tablet 0  . insulin aspart (NOVOLOG) 100 UNIT/ML injection Inject 0-15 Units into the skin 3 (three) times daily with meals. 10 mL 11  . insulin glargine (LANTUS) 100 UNIT/ML injection Inject 0.34 mLs (34 Units total) into the skin daily. 10 mL 11  . isosorbide mononitrate (IMDUR) 60 MG 24 hr tablet Take 1 tablet (60 mg total) by mouth daily. 30 tablet 0  . lanolin ointment Apply 1 application topically 2 (two) times daily as needed for dry skin.    Marland Kitchen LINZESS 290 MCG CAPS capsule TAKE 1 CAPSULE ONCE DAILY 30 MINUTES BEFORE MEALS 90 capsule 3  . metoprolol (LOPRESSOR) 100 MG tablet Take 100 mg by mouth 2 (two) times daily.     . Multiple Vitamin (MULTIVITAMIN WITH MINERALS) TABS tablet Take 1 tablet by mouth daily.    Marland Kitchen oxybutynin (DITROPAN) 5 MG tablet Take 1 tablet (5 mg total) by mouth 3 (three) times daily. 30 tablet 0  . pantoprazole (PROTONIX) 40 MG tablet Take 1 tablet (40 mg total) by mouth daily.    Marland Kitchen senna-docusate (SENOKOT-S) 8.6-50 MG tablet  Take 1 tablet by mouth 2 (two) times daily.    Marland Kitchen warfarin (COUMADIN) 1 MG tablet Take 1 tablet (1 mg total) by mouth daily at 6 PM. 30 tablet 0  . zolpidem (AMBIEN) 10 MG tablet Take 10 mg by mouth at bedtime.      No current facility-administered medications for this visit.    Allergies:   Review of patient's allergies indicates no known allergies.    Social History:  The patient  reports that he quit smoking about 5 years ago. His smoking use included Cigarettes. He has a 4 pack-year smoking history. He does not have any smokeless tobacco history on file. He reports that he does not drink alcohol or use illicit drugs.   Family History:  The patient's family history includes COPD in his father; Diabetes in his mother; Hypertension in  his mother. There is no history of Colon cancer.    ROS: All other systems are reviewed and negative. Unless otherwise mentioned in H&P    PHYSICAL EXAM: VS:  There were no vitals taken for this visit. , BMI There is no weight on file to calculate BMI. GEN: Well nourished, well developed, in no acute distress HEENT: normal Neck: no JVD, carotid bruits, or masses Cardiac: RRR; tachycardia, no murmurs, rubs, or gallops,no edema  Respiratory:  clear to auscultation bilaterally, normal work of breathing GI: soft, nontender, nondistended, + BS MS: no deformity or atrophy Skin: warm and dry, no rash Neuro:  Strength and sensation are intact Psych: euthymic mood, full affect   Recent Labs: 08/14/2015: B Natriuretic Peptide 609.0*; TSH 1.917 09/04/2015: Magnesium 1.7 09/05/2015: Hemoglobin 10.4*; Platelets 207 09/06/2015: ALT 17 09/07/2015: BUN 24*; Creatinine, Ser 2.02*; Potassium 3.6; Sodium 132*    Lipid Panel    Component Value Date/Time   CHOL 79 08/15/2015 0422   TRIG 38 08/15/2015 0422   HDL 34* 08/15/2015 0422   CHOLHDL 2.3 08/15/2015 0422   VLDL 8 08/15/2015 0422   LDLCALC 37 08/15/2015 0422      Wt Readings from Last 3 Encounters:  09/08/15  241 lb 12.8 oz (109.68 kg)  06/16/15 262 lb (118.842 kg)  06/22/14 265 lb (120.203 kg)      ASSESSMENT AND PLAN:  1. Atrial fibrillation: Rate is not well controlled currently, but he is asymptomatic. He is not on anticoagulation. He is on metoprolol at maximum dose, on amiodarone at 200 mg daily,. Will keep him on current medication regimen until after ablation. He will need to be established with local cardiologist in [place].   2. Hypertension: Slightly elevated today. Will monitor. Reluctant to adjust medications until after ablation.   3. NICM: Systolic Dysfunction: Continue lasix. Will need to change to coreg once ablation is completed.   Current medicines are reviewed at length with the patient today.    Labs/ tests ordered today include:  No orders of the defined types were placed in this encounter.     Disposition:   FU with one month to 6 weeks. To be established with local cardiologist.   Signed, Jory Sims, NP  09/19/2015 7:16 AM    [facility] [address] [place], [place] [address] Phone: [phone]; Fax: [phone]

## 2015-09-19 NOTE — Patient Instructions (Addendum)
Medication Instructions:  Your physician recommends that you continue on your current medications as directed. Please refer to the Current Medication list given to you today.   Labwork: NONE  Testing/Procedures: NONE  Follow-Up: Your physician recommends that you schedule a follow-up appointment in: 4-6 WEEKS    Any Other Special Instructions Will Be Listed Below (If Applicable).     If you need a refill on your cardiac medications before your next appointment, please call your pharmacy.  Thanks for choosing [facility]!!!

## 2015-09-19 NOTE — Progress Notes (Signed)
Name: Frank Hoover    DOB: 11/08/1950  Age: 65 y.o.  MR#: [id number]       PCP:  Alonza Bogus, MD      Insurance: Payor: [employer] MEDICARE / Plan: [facility] MEDICARE / Product Type: *No Product type* /   CC:   No chief complaint on file.   VS Filed Vitals:   09/19/15 1302  Pulse: 96  Height: 5\' 6"  (1.676 m)  Weight: 223 lb (101.152 kg)  SpO2: 99%    Weights Current Weight  09/19/15 223 lb (101.152 kg)  09/08/15 241 lb 12.8 oz (109.68 kg)  06/16/15 262 lb (118.842 kg)    Blood Pressure  BP Readings from Last 3 Encounters:  09/08/15 159/67  06/16/15 120/69  06/22/14 169/84     Admit date:  (Not on file) Last encounter with RMR:  Visit date not found   Allergy Review of patient's allergies indicates no known allergies.  Current Outpatient Prescriptions  Medication Sig Dispense Refill  . acetaminophen (TYLENOL) 500 MG tablet Take 1 tablet (500 mg total) by mouth every 6 (six) hours as needed for moderate pain. 30 tablet 0  . albuterol (PROAIR HFA) 108 (90 BASE) MCG/ACT inhaler Inhale 2 puffs into the lungs every 6 (six) hours as needed for wheezing or shortness of breath.     . allopurinol (ZYLOPRIM) 300 MG tablet Take 300 mg by mouth daily.      . Amino Acids-Protein Hydrolys (FEEDING SUPPLEMENT, PRO-STAT SUGAR FREE 64,) LIQD Take 30 mLs by mouth 3 (three) times daily between meals. 900 mL 0  . amiodarone (PACERONE) 200 MG tablet Take 1 tablet (200 mg total) by mouth daily.    Marland Kitchen atorvastatin (LIPITOR) 20 MG tablet Take 20 mg by mouth daily.    . Cholecalciferol (VITAMIN D PO) Take 1,000 Units by mouth daily.    . colchicine 0.6 MG tablet Take 0.6 mg by mouth daily.    . collagenase (SANTYL) ointment Apply topically daily. 15 g 0  . feeding supplement, ENSURE ENLIVE, (ENSURE ENLIVE) LIQD Take 237 mLs by mouth 3 (three) times daily between meals. 237 mL 12  . furosemide (LASIX) 40 MG tablet Take 1 tablet (40 mg total) by mouth 2 (two) times daily. 30 tablet   .  gabapentin (NEURONTIN) 400 MG capsule Take 400 mg by mouth 2 (two) times daily. Takes at noon & bedtime    . hydrALAZINE (APRESOLINE) 25 MG tablet Take 3 tablets (75 mg total) by mouth every 8 (eight) hours.    Marland Kitchen HYDROcodone-acetaminophen (NORCO/VICODIN) 5-325 MG tablet Take 1 tablet by mouth every 6 (six) hours as needed for severe pain. 10 tablet 0  . insulin aspart (NOVOLOG) 100 UNIT/ML injection Inject 0-15 Units into the skin 3 (three) times daily with meals. 10 mL 11  . isosorbide mononitrate (IMDUR) 60 MG 24 hr tablet Take 1 tablet (60 mg total) by mouth daily. 30 tablet 0  . lanolin ointment Apply 1 application topically 2 (two) times daily as needed for dry skin.    Marland Kitchen LINZESS 290 MCG CAPS capsule TAKE 1 CAPSULE ONCE DAILY 30 MINUTES BEFORE MEALS 90 capsule 3  . metoprolol (LOPRESSOR) 100 MG tablet Take 100 mg by mouth 2 (two) times daily.     . Multiple Vitamin (MULTIVITAMIN WITH MINERALS) TABS tablet Take 1 tablet by mouth daily.    Marland Kitchen oxybutynin (DITROPAN) 5 MG tablet Take 1 tablet (5 mg total) by mouth 3 (three) times daily. 30 tablet 0  .  pantoprazole (PROTONIX) 40 MG tablet Take 1 tablet (40 mg total) by mouth daily.    Marland Kitchen senna-docusate (SENOKOT-S) 8.6-50 MG tablet Take 1 tablet by mouth 2 (two) times daily.    Nelva Nay SOLOSTAR 300 UNIT/ML SOPN 15 Units every morning. Take 15- 25 units daily as needed    . warfarin (COUMADIN) 1 MG tablet Take 1 tablet (1 mg total) by mouth daily at 6 PM. 30 tablet 0  . zolpidem (AMBIEN) 10 MG tablet Take 10 mg by mouth at bedtime.      No current facility-administered medications for this visit.    Discontinued Meds:    Medications Discontinued During This Encounter  Medication Reason  . insulin glargine (LANTUS) 100 UNIT/ML injection Error    Patient Active Problem List   Diagnosis Date Noted  . Pressure ulcer 09/05/2015  . Abdominal pain   . Atrial fibrillation with RVR ([place])   . HCAP (healthcare-associated pneumonia)   . Acute renal  failure superimposed on stage 3 chronic kidney disease ([place])   . Metabolic encephalopathy   . Abdominal distension   . Urinary tract infection, site not specified   . Cardiogenic shock ([place])   . Acute systolic CHF (congestive heart failure) ([place])   . Uncontrolled type 2 diabetes mellitus with complication ([place])   . Urinary retention   . Bacteremia   . Ischemic colitis ([place])   . Noninfectious gastroenteritis, unspecified   . Proctosigmoiditis   . Lower GI bleed   . SVT (supraventricular tachycardia) ([place]) 08/24/2015  . Debility   . Blood in stool   . Acute blood loss anemia   . Abnormal CT scan, colon   . Acute systolic heart failure ([place])   . Acute kidney injury ([place])   . Acute respiratory failure ([place])   . Septic shock ([employer])   . Acute encephalopathy   . Atrial flutter with rapid ventricular response ([place]) 08/14/2015  . Systolic CHF ([place]) [id number]  . History of colonic polyps   . Dehydration 12/05/2013  . Acute renal failure ([place]) 12/02/2013  . Sleep apnea 12/02/2013  . Hyponatremia 12/02/2013  . Acute on chronic renal failure ([place]) 12/02/2013  . COPD (chronic obstructive pulmonary disease) ([place])   . DM (diabetes mellitus) ([place])   . HTN (hypertension)   . Hyperlipemia   . CRF (chronic renal failure)   . Hepatomegaly 03/25/2013  . Dysphagia 12/06/2010  . History of adenomatous polyp of colon 09/21/2010  . Gastroparesis 09/21/2010  . Obesity 09/21/2010  . GERD 10/09/2009  . Constipation 10/09/2009  . ABDOMINAL PAIN-EPIGASTRIC 10/09/2009    LABS    Component Value Date/Time   NA 132* 09/07/2015 1211   NA 137 09/05/2015 0543   NA 141 09/04/2015 0402   K 3.6 09/07/2015 1211   K 3.7 09/05/2015 0543   K 3.3* 09/04/2015 0402   CL 101 09/07/2015 1211   CL 104 09/05/2015 0543   CL 96* 09/04/2015 0402   CO2 23 09/07/2015 1211   CO2 25 09/05/2015 0543   CO2 23 09/04/2015 0402   GLUCOSE 193* 09/07/2015 1211   GLUCOSE 111* 09/05/2015 0543   GLUCOSE 102* 09/04/2015  0402   BUN 24* 09/07/2015 1211   BUN 23* 09/05/2015 0543   BUN 23* 09/04/2015 0402   CREATININE 2.02* 09/07/2015 1211   CREATININE 1.97* 09/05/2015 0543   CREATININE 2.13* 09/04/2015 0402   CALCIUM 8.7* 09/07/2015 1211   CALCIUM 9.0 09/05/2015 0543   CALCIUM 9.9 09/04/2015 0402   GFRNONAA 33*  09/07/2015 1211   GFRNONAA 34* 09/05/2015 0543   GFRNONAA 31* 09/04/2015 0402   GFRAA 39* 09/07/2015 1211   GFRAA 40* 09/05/2015 0543   GFRAA 36* 09/04/2015 0402   CMP     Component Value Date/Time   NA 132* 09/07/2015 1211   K 3.6 09/07/2015 1211   CL 101 09/07/2015 1211   CO2 23 09/07/2015 1211   GLUCOSE 193* 09/07/2015 1211   BUN 24* 09/07/2015 1211   CREATININE 2.02* 09/07/2015 1211   CALCIUM 8.7* 09/07/2015 1211   PROT 6.0* 09/06/2015 1138   ALBUMIN 2.5* 09/06/2015 1138   AST 18 09/06/2015 1138   ALT 17 09/06/2015 1138   ALKPHOS 53 09/06/2015 1138   BILITOT 0.7 09/06/2015 1138   GFRNONAA 33* 09/07/2015 1211   GFRAA 39* 09/07/2015 1211       Component Value Date/Time   WBC 3.6* 09/05/2015 0543   WBC 4.3 09/04/2015 0402   WBC 4.2 09/03/2015 0320   HGB 10.4* 09/05/2015 0543   HGB 10.0* 09/04/2015 0402   HGB 10.0* 09/03/2015 0320   HCT 32.6* 09/05/2015 0543   HCT 31.3* 09/04/2015 0402   HCT 31.1* 09/03/2015 0320   MCV 85.1 09/05/2015 0543   MCV 85.1 09/04/2015 0402   MCV 84.7 09/03/2015 0320    Lipid Panel     Component Value Date/Time   CHOL 79 08/15/2015 0422   TRIG 38 08/15/2015 0422   HDL 34* 08/15/2015 0422   CHOLHDL 2.3 08/15/2015 0422   VLDL 8 08/15/2015 0422   LDLCALC 37 08/15/2015 0422    ABG    Component Value Date/Time   PHART 7.406 08/20/2015 0340   PCO2ART 42.3 08/20/2015 0340   PO2ART 86.9 08/20/2015 0340   HCO3 26.0* 08/20/2015 0340   TCO2 27.3 08/20/2015 0340   ACIDBASEDEF 0.2 08/19/2015 0350   O2SAT 96.1 08/20/2015 0340     Lab Results  Component Value Date   TSH 1.917 08/14/2015   BNP (last 3 results)  Recent Labs   08/14/15 1501  BNP 609.0*    ProBNP (last 3 results) No results for input(s): PROBNP in the last 8760 hours.  Cardiac Panel (last 3 results) No results for input(s): CKTOTAL, CKMB, TROPONINI, RELINDX in the last 72 hours.  Iron/TIBC/Ferritin/ %Sat No results found for: IRON, TIBC, FERRITIN, IRONPCTSAT   EKG Orders placed or performed during the hospital encounter of 08/14/15  . EKG  . EKG 12-Lead  . EKG 12-Lead  . EKG 12-Lead  . EKG 12-Lead  . EKG 12-Lead  . EKG 12-Lead  . EKG 12-Lead  . EKG 12-Lead  . EKG 12-Lead  . EKG 12-Lead  . EKG 12-Lead  . EKG 12-Lead  . EKG 12-Lead  . EKG 12-Lead  . EKG 12-Lead  . EKG 12-Lead     Prior Assessment and Plan Problem List as of 09/19/2015      Cardiovascular and Mediastinum   HTN (hypertension)   Atrial flutter with rapid ventricular response (HCC)   Systolic CHF (HCC)   Acute systolic heart failure (HCC)   SVT (supraventricular tachycardia) (HCC)   Cardiogenic shock (HCC)   Acute systolic CHF (congestive heart failure) (HCC)   Atrial fibrillation with RVR (HCC)     Respiratory   COPD (chronic obstructive pulmonary disease) ([place])   Acute respiratory failure ([place])   HCAP (healthcare-associated pneumonia)     Digestive   GERD   Last Assessment & Plan 03/17/2012 Office Visit Edited 03/17/2012 11:40 AM by Orvil Feil, NP  Controlled with Prilosec BID. No dysphagia.       Constipation   Last Assessment & Plan 06/02/2014 Office Visit Written 06/02/2014  1:23 PM by Carlis Stable, NP    65 year old male continues to have chronic constipation symptoms. He is ordered Linzess 145 g which he takes every 2-3 days even notes ordered daily. Encouraged patient to take his medication as ordered which is daily. Recommended adequate fiber intake in his diet. No red flag or warning signs or symptoms including hematochezia, melena, unintentional weight loss, unexplained fever or chills, decreased appetite, or change in bowel habits. See  assessment and plan for history of adenomatous colon polyps further details.      Gastroparesis   Last Assessment & Plan 03/25/2013 Office Visit Written 03/25/2013  2:51 PM by Mahala Menghini, PA-C    Doing well. Continue gastroparesis diet.       Dysphagia   Last Assessment & Plan 12/06/2010 Office Visit Written 12/06/2010  5:02 PM by Orvil Feil, NP    65 year old male referred for dysphagia, yet this has completely resolved. He reports having a "cold" around time of symptoms. States X 2 weeks. No reflux or odynophagia. No other warning signs. Discussed in detail signs and symptoms to report. Should his symptoms return, we would not hesitate to proceed with EGD/ED. For now, we will hold off and see how he does. He was instructed to contact us if any reoccurrence.       Hepatomegaly   Last Assessment & Plan 03/25/2013 Office Visit Written 03/25/2013  2:52 PM by Mahala Menghini, PA-C    Mild hepatomegaly on U/S. LFTs normal in 02/2013. Continue to monitor LFTs with routine labs through PCP.      Proctosigmoiditis   Lower GI bleed   Noninfectious gastroenteritis, unspecified   Ischemic colitis ([place])     Endocrine   DM (diabetes mellitus) ([place])   Uncontrolled type 2 diabetes mellitus with complication (HCC)     Nervous and Auditory   Acute encephalopathy   Metabolic encephalopathy     Musculoskeletal and Integument   Pressure ulcer     Genitourinary   Acute renal failure (HCC)   CRF (chronic renal failure)   Acute on chronic renal failure (HCC)   Acute kidney injury ([place])   Acute renal failure superimposed on stage 3 chronic kidney disease (HCC)   Urinary tract infection, site not specified   Urinary retention     Other   ABDOMINAL PAIN-EPIGASTRIC   Last Assessment & Plan 09/21/2010 Office Visit Written 09/21/2010  1:08 PM by Andria Meuse, NP    Resolved      History of adenomatous polyp of colon   Last Assessment & Plan 06/02/2014 Office Visit Written 06/02/2014  1:25 PM by  Carlis Stable, NP    65 year old male with a history of adenomatous colon polyps with spastic colonoscopies having inadequate prep precluding appropriately full examination of his colonic mucosa making it possible that a significant lesion was missed. Schedule him for his recommend a one-year follow-up colonoscopy. This time we will monitor his prep instructions to promote inadequate prep. No red flag or warning signs symptoms including hematochezia or melena, unintentional weight loss, unexplained fever and chills, abdominal masses, excessive fatigue or weakness, and others. For his prep we'll do 2 days of clear liquids prior to the day of the procedure, 2 days before his procedure we will have him do a half dose prep, the day before his procedure do  a full dose split prep. Patient to call if any problems completing his prep. Educated the patient and his wife on the importance of doing adequate prep peripheral detection of polyps in his colon. Both the patient and his wife verbalized understanding.  Proceed with TCS with Dr. Gala Romney in near future: the risks, benefits, and alternatives have been discussed with the patient in detail. The patient states understanding and desires to proceed.       Obesity   Last Assessment & Plan 09/21/2010 Office Visit Written 09/21/2010  1:10 PM by Andria Meuse, NP    Low fat low cholesterol diet Wt loss 1-2# per week Gradually increase exercise up to 30 minutes daily      Hyperlipemia   Sleep apnea   Hyponatremia   Dehydration   History of colonic polyps   Septic shock ([place])   Debility   Blood in stool   Acute blood loss anemia   Abnormal CT scan, colon   Bacteremia   Abdominal distension   Abdominal pain       Imaging: Dg Abd Portable 1v  09/03/2015  CLINICAL DATA:  Abdominal pain and distension EXAM: PORTABLE ABDOMEN - 1 VIEW COMPARISON:  08/16/2015 FINDINGS: Mildly distended large and small bowel with improvement from the prior study. Stool in the  right colon. Foley catheter in the bladder. Lumbar levoscoliosis.  No acute skeletal abnormality. IMPRESSION: Mild dilatation of large and small bowel with improvement from 08/16/2015. Probable ileus. Electronically Signed   By: Franchot Gallo M.D.   On: 09/03/2015 17:06

## 2015-09-21 DIAGNOSIS — N365 Urethral false passage: Secondary | ICD-10-CM | POA: Diagnosis not present

## 2015-09-27 ENCOUNTER — Ambulatory Visit (HOSPITAL_BASED_OUTPATIENT_CLINIC_OR_DEPARTMENT_OTHER): Payer: Medicare Other

## 2015-09-27 ENCOUNTER — Ambulatory Visit (HOSPITAL_COMMUNITY)
Admission: RE | Admit: 2015-09-27 | Discharge: 2015-09-28 | Disposition: A | Discharge status: Skilled Nursing Facility | Payer: Medicare Other | Source: Ambulatory Visit | Attending: Internal Medicine | Admitting: Internal Medicine

## 2015-09-27 ENCOUNTER — Encounter (HOSPITAL_COMMUNITY)
Admission: RE | Disposition: A | Discharge status: Skilled Nursing Facility | Payer: Self-pay | Source: Ambulatory Visit | Attending: Internal Medicine

## 2015-09-27 ENCOUNTER — Encounter (HOSPITAL_COMMUNITY): Payer: Self-pay | Admitting: Internal Medicine

## 2015-09-27 DIAGNOSIS — J449 Chronic obstructive pulmonary disease, unspecified: Secondary | ICD-10-CM | POA: Diagnosis not present

## 2015-09-27 DIAGNOSIS — Z23 Encounter for immunization: Secondary | ICD-10-CM | POA: Diagnosis not present

## 2015-09-27 DIAGNOSIS — Z6835 Body mass index (BMI) 35.0-35.9, adult: Secondary | ICD-10-CM | POA: Insufficient documentation

## 2015-09-27 DIAGNOSIS — I429 Cardiomyopathy, unspecified: Secondary | ICD-10-CM | POA: Diagnosis not present

## 2015-09-27 DIAGNOSIS — E669 Obesity, unspecified: Secondary | ICD-10-CM | POA: Insufficient documentation

## 2015-09-27 DIAGNOSIS — K529 Noninfective gastroenteritis and colitis, unspecified: Secondary | ICD-10-CM | POA: Diagnosis not present

## 2015-09-27 DIAGNOSIS — Z87891 Personal history of nicotine dependence: Secondary | ICD-10-CM | POA: Insufficient documentation

## 2015-09-27 DIAGNOSIS — I130 Hypertensive heart and chronic kidney disease with heart failure and stage 1 through stage 4 chronic kidney disease, or unspecified chronic kidney disease: Secondary | ICD-10-CM | POA: Insufficient documentation

## 2015-09-27 DIAGNOSIS — I4892 Unspecified atrial flutter: Secondary | ICD-10-CM

## 2015-09-27 DIAGNOSIS — K219 Gastro-esophageal reflux disease without esophagitis: Secondary | ICD-10-CM | POA: Diagnosis not present

## 2015-09-27 DIAGNOSIS — E1122 Type 2 diabetes mellitus with diabetic chronic kidney disease: Secondary | ICD-10-CM | POA: Diagnosis not present

## 2015-09-27 DIAGNOSIS — I471 Supraventricular tachycardia, unspecified: Secondary | ICD-10-CM | POA: Diagnosis present

## 2015-09-27 DIAGNOSIS — M1990 Unspecified osteoarthritis, unspecified site: Secondary | ICD-10-CM | POA: Diagnosis not present

## 2015-09-27 DIAGNOSIS — I509 Heart failure, unspecified: Secondary | ICD-10-CM | POA: Diagnosis not present

## 2015-09-27 DIAGNOSIS — E1143 Type 2 diabetes mellitus with diabetic autonomic (poly)neuropathy: Secondary | ICD-10-CM | POA: Diagnosis not present

## 2015-09-27 DIAGNOSIS — Q211 Atrial septal defect: Secondary | ICD-10-CM | POA: Diagnosis not present

## 2015-09-27 DIAGNOSIS — E785 Hyperlipidemia, unspecified: Secondary | ICD-10-CM | POA: Insufficient documentation

## 2015-09-27 DIAGNOSIS — N183 Chronic kidney disease, stage 3 (moderate): Secondary | ICD-10-CM | POA: Diagnosis not present

## 2015-09-27 HISTORY — DX: Unspecified atrial flutter: I48.92

## 2015-09-27 HISTORY — DX: Cardiac arrhythmia, unspecified: I49.9

## 2015-09-27 HISTORY — PX: ELECTROPHYSIOLOGIC STUDY: SHX172A

## 2015-09-27 HISTORY — DX: Essential (primary) hypertension: I10

## 2015-09-27 HISTORY — DX: Unspecified osteoarthritis, unspecified site: M19.90

## 2015-09-27 HISTORY — PX: TEE WITHOUT CARDIOVERSION: SHX5443

## 2015-09-27 LAB — BASIC METABOLIC PANEL
Anion gap: 11 (ref 5–15)
BUN: 49 mg/dL — ABNORMAL HIGH (ref 6–20)
CO2: 23 mmol/L (ref 22–32)
Calcium: 9 mg/dL (ref 8.9–10.3)
Chloride: 98 mmol/L — ABNORMAL LOW (ref 101–111)
Creatinine, Ser: 2.58 mg/dL — ABNORMAL HIGH (ref 0.61–1.24)
GFR calc Af Amer: 29 mL/min — ABNORMAL LOW (ref >60)
GFR calc non Af Amer: 25 mL/min — ABNORMAL LOW (ref >60)
Glucose, Bld: 162 mg/dL — ABNORMAL HIGH (ref 65–99)
Potassium: 3.2 mmol/L — ABNORMAL LOW (ref 3.5–5.1)
Sodium: 132 mmol/L — ABNORMAL LOW (ref 135–145)

## 2015-09-27 LAB — CBC
HCT: 29.5 % — ABNORMAL LOW (ref 39.0–52.0)
Hemoglobin: 9.3 g/dL — ABNORMAL LOW (ref 13.0–17.0)
MCH: 28 pg (ref 26.0–34.0)
MCHC: 31.5 g/dL (ref 30.0–36.0)
MCV: 88.9 fL (ref 78.0–100.0)
Platelets: 269 10*3/uL (ref 150–400)
RBC: 3.32 MIL/uL — ABNORMAL LOW (ref 4.22–5.81)
RDW: 20.5 % — ABNORMAL HIGH (ref 11.5–15.5)
WBC: 4 10*3/uL (ref 4.0–10.5)

## 2015-09-27 LAB — GLUCOSE, CAPILLARY
Glucose-Capillary: 178 mg/dL — ABNORMAL HIGH (ref 65–99)
Glucose-Capillary: 104 mg/dL — ABNORMAL HIGH (ref 65–99)
Glucose-Capillary: 127 mg/dL — ABNORMAL HIGH (ref 65–99)
Glucose-Capillary: 111 mg/dL — ABNORMAL HIGH (ref 65–99)

## 2015-09-27 LAB — PROTIME-INR
INR: 1.19
Prothrombin Time: 15.3 seconds — ABNORMAL HIGH (ref 11.4–15.2)

## 2015-09-27 SURGERY — A-FLUTTER/A-TACH/SVT ABLATION
Anesthesia: LOCAL

## 2015-09-27 MED ORDER — MIDAZOLAM HCL 5 MG/5ML IJ SOLN
INTRAMUSCULAR | Status: AC
  Filled 2015-09-27: qty 5

## 2015-09-27 MED ORDER — INSULIN ASPART 100 UNIT/ML ~~LOC~~ SOLN
0.0000 [IU] | Freq: Three times a day (TID) | SUBCUTANEOUS | Status: DC
  Administered 2015-09-27: 2 [IU] via SUBCUTANEOUS

## 2015-09-27 MED ORDER — ADULT MULTIVITAMIN W/MINERALS CH
1.0000 | ORAL_TABLET | Freq: Every day | ORAL | Status: DC
  Administered 2015-09-27 – 2015-09-28 (×2): 1 via ORAL
  Filled 2015-09-27 (×2): qty 1

## 2015-09-27 MED ORDER — ISOSORBIDE MONONITRATE ER 60 MG PO TB24
60.0000 mg | ORAL_TABLET | Freq: Every day | ORAL | Status: DC
  Administered 2015-09-27 – 2015-09-28 (×2): 60 mg via ORAL
  Filled 2015-09-27 (×2): qty 1

## 2015-09-27 MED ORDER — SENNOSIDES-DOCUSATE SODIUM 8.6-50 MG PO TABS
1.0000 | ORAL_TABLET | Freq: Two times a day (BID) | ORAL | Status: DC
  Administered 2015-09-27: 1 via ORAL
  Filled 2015-09-27: qty 1

## 2015-09-27 MED ORDER — SODIUM CHLORIDE 0.9 % IV SOLN
INTRAVENOUS | Status: DC
  Administered 2015-09-27: 09:00:00 via INTRAVENOUS

## 2015-09-27 MED ORDER — FENTANYL CITRATE (PF) 100 MCG/2ML IJ SOLN
INTRAMUSCULAR | Status: DC | PRN
  Administered 2015-09-27 (×3): 12.5 ug via INTRAVENOUS

## 2015-09-27 MED ORDER — GABAPENTIN 300 MG PO CAPS
300.0000 mg | ORAL_CAPSULE | Freq: Every day | ORAL | Status: DC
  Administered 2015-09-28: 300 mg via ORAL
  Filled 2015-09-27: qty 1

## 2015-09-27 MED ORDER — ACETAMINOPHEN 500 MG PO TABS: 500.0000 mg | ORAL_TABLET | Freq: Four times a day (QID) | ORAL | Status: DC | PRN

## 2015-09-27 MED ORDER — FENTANYL CITRATE (PF) 100 MCG/2ML IJ SOLN
INTRAMUSCULAR | Status: AC
  Filled 2015-09-27: qty 2

## 2015-09-27 MED ORDER — ISOPROTERENOL HCL 0.2 MG/ML IJ SOLN
INTRAMUSCULAR | Status: DC | PRN
  Administered 2015-09-27: 4 ug/min via INTRAVENOUS

## 2015-09-27 MED ORDER — GLIMEPIRIDE 1 MG PO TABS
2.0000 mg | ORAL_TABLET | Freq: Every day | ORAL | Status: DC
  Administered 2015-09-27 – 2015-09-28 (×2): 2 mg via ORAL
  Filled 2015-09-27 (×2): qty 2

## 2015-09-27 MED ORDER — APIXABAN 5 MG PO TABS
5.0000 mg | ORAL_TABLET | Freq: Two times a day (BID) | ORAL | Status: DC
  Administered 2015-09-27 – 2015-09-28 (×2): 5 mg via ORAL
  Filled 2015-09-27 (×2): qty 1

## 2015-09-27 MED ORDER — FENTANYL CITRATE (PF) 100 MCG/2ML IJ SOLN
INTRAMUSCULAR | Status: DC | PRN
  Administered 2015-09-27 (×2): 25 ug via INTRAVENOUS

## 2015-09-27 MED ORDER — FUROSEMIDE 40 MG PO TABS
40.0000 mg | ORAL_TABLET | Freq: Two times a day (BID) | ORAL | Status: DC
  Administered 2015-09-27 – 2015-09-28 (×2): 40 mg via ORAL
  Filled 2015-09-27 (×2): qty 1

## 2015-09-27 MED ORDER — ENSURE ENLIVE PO LIQD: 237.0000 mL | Freq: Three times a day (TID) | ORAL | Status: DC

## 2015-09-27 MED ORDER — INSULIN GLARGINE 100 UNIT/ML ~~LOC~~ SOLN
15.0000 [IU] | Freq: Every morning | SUBCUTANEOUS | Status: DC
  Administered 2015-09-28: 15 [IU] via SUBCUTANEOUS
  Filled 2015-09-27 (×2): qty 0.15

## 2015-09-27 MED ORDER — SODIUM CHLORIDE 0.9% FLUSH: 3.0000 mL | INTRAVENOUS | Status: DC | PRN

## 2015-09-27 MED ORDER — HYDRALAZINE HCL 50 MG PO TABS: 50.0000 mg | ORAL_TABLET | Freq: Two times a day (BID) | ORAL | Status: DC

## 2015-09-27 MED ORDER — ISOPROTERENOL HCL 0.2 MG/ML IJ SOLN
INTRAMUSCULAR | Status: AC
  Filled 2015-09-27: qty 5

## 2015-09-27 MED ORDER — METOPROLOL TARTRATE 100 MG PO TABS
100.0000 mg | ORAL_TABLET | Freq: Two times a day (BID) | ORAL | Status: DC
  Administered 2015-09-27 – 2015-09-28 (×2): 100 mg via ORAL
  Filled 2015-09-27 (×2): qty 1

## 2015-09-27 MED ORDER — MIDAZOLAM HCL 5 MG/5ML IJ SOLN
INTRAMUSCULAR | Status: DC | PRN
  Administered 2015-09-27 (×3): 1 mg via INTRAVENOUS

## 2015-09-27 MED ORDER — INSULIN LISPRO 100 UNIT/ML (KWIKPEN): 0.0000 [IU] | PEN_INJECTOR | SUBCUTANEOUS | Status: DC

## 2015-09-27 MED ORDER — AMLODIPINE BESYLATE 5 MG PO TABS
5.0000 mg | ORAL_TABLET | Freq: Every day | ORAL | Status: DC
  Administered 2015-09-27 – 2015-09-28 (×2): 5 mg via ORAL
  Filled 2015-09-27 (×2): qty 1

## 2015-09-27 MED ORDER — ALLOPURINOL 300 MG PO TABS
300.0000 mg | ORAL_TABLET | Freq: Every day | ORAL | Status: DC
  Administered 2015-09-27 – 2015-09-28 (×2): 300 mg via ORAL
  Filled 2015-09-27 (×2): qty 1

## 2015-09-27 MED ORDER — ZOLPIDEM TARTRATE 5 MG PO TABS
10.0000 mg | ORAL_TABLET | Freq: Every day | ORAL | Status: DC
  Administered 2015-09-27: 10 mg via ORAL
  Filled 2015-09-27: qty 2

## 2015-09-27 MED ORDER — ACETAMINOPHEN 325 MG PO TABS: 650.0000 mg | ORAL_TABLET | ORAL | Status: DC | PRN

## 2015-09-27 MED ORDER — SODIUM CHLORIDE 0.9 % IV SOLN: 250.0000 mL | INTRAVENOUS | Status: DC | PRN

## 2015-09-27 MED ORDER — HYDROCODONE-ACETAMINOPHEN 5-325 MG PO TABS
1.0000 | ORAL_TABLET | Freq: Four times a day (QID) | ORAL | Status: DC | PRN
  Administered 2015-09-27: 1 via ORAL
  Filled 2015-09-27: qty 1

## 2015-09-27 MED ORDER — ONDANSETRON HCL 4 MG/2ML IJ SOLN: 4.0000 mg | Freq: Four times a day (QID) | INTRAMUSCULAR | Status: DC | PRN

## 2015-09-27 MED ORDER — BUPIVACAINE HCL (PF) 0.25 % IJ SOLN
INTRAMUSCULAR | Status: AC
  Filled 2015-09-27: qty 30

## 2015-09-27 MED ORDER — PRO-STAT SUGAR FREE PO LIQD
30.0000 mL | Freq: Three times a day (TID) | ORAL | Status: DC
  Filled 2015-09-27 (×2): qty 30

## 2015-09-27 MED ORDER — CLONIDINE HCL 0.2 MG PO TABS
0.2000 mg | ORAL_TABLET | Freq: Every day | ORAL | Status: DC
  Administered 2015-09-27 – 2015-09-28 (×2): 0.2 mg via ORAL
  Filled 2015-09-27 (×2): qty 1

## 2015-09-27 MED ORDER — ATORVASTATIN CALCIUM 20 MG PO TABS
20.0000 mg | ORAL_TABLET | Freq: Every day | ORAL | Status: DC
  Administered 2015-09-27 – 2015-09-28 (×2): 20 mg via ORAL
  Filled 2015-09-27 (×2): qty 1

## 2015-09-27 MED ORDER — HEPARIN (PORCINE) IN NACL 2-0.9 UNIT/ML-% IJ SOLN
INTRAMUSCULAR | Status: DC | PRN
  Administered 2015-09-27: 12:00:00

## 2015-09-27 MED ORDER — HYDRALAZINE HCL 50 MG PO TABS
50.0000 mg | ORAL_TABLET | Freq: Two times a day (BID) | ORAL | Status: DC
  Administered 2015-09-27 – 2015-09-28 (×2): 50 mg via ORAL
  Filled 2015-09-27 (×2): qty 1

## 2015-09-27 MED ORDER — MIDAZOLAM HCL 5 MG/5ML IJ SOLN
INTRAMUSCULAR | Status: DC | PRN
  Administered 2015-09-27: 2 mg via INTRAVENOUS
  Administered 2015-09-27: 3 mg via INTRAVENOUS

## 2015-09-27 MED ORDER — BUPIVACAINE HCL (PF) 0.25 % IJ SOLN
INTRAMUSCULAR | Status: DC | PRN
  Administered 2015-09-27: 30 mL

## 2015-09-27 MED ORDER — OXYBUTYNIN CHLORIDE 5 MG PO TABS
5.0000 mg | ORAL_TABLET | Freq: Three times a day (TID) | ORAL | Status: DC
  Administered 2015-09-27 – 2015-09-28 (×3): 5 mg via ORAL
  Filled 2015-09-27 (×3): qty 1

## 2015-09-27 MED ORDER — LOPERAMIDE HCL 2 MG PO CAPS: 2.0000 mg | ORAL_CAPSULE | ORAL | Status: DC | PRN

## 2015-09-27 MED ORDER — PANTOPRAZOLE SODIUM 40 MG PO TBEC
40.0000 mg | DELAYED_RELEASE_TABLET | Freq: Every day | ORAL | Status: DC
  Administered 2015-09-27 – 2015-09-28 (×2): 40 mg via ORAL
  Filled 2015-09-27 (×2): qty 1

## 2015-09-27 MED ORDER — HEPARIN (PORCINE) IN NACL 2-0.9 UNIT/ML-% IJ SOLN
INTRAMUSCULAR | Status: AC
  Filled 2015-09-27: qty 500

## 2015-09-27 MED ORDER — ALBUTEROL SULFATE (2.5 MG/3ML) 0.083% IN NEBU: 3.0000 mL | INHALATION_SOLUTION | Freq: Four times a day (QID) | RESPIRATORY_TRACT | Status: DC | PRN

## 2015-09-27 MED ORDER — AMIODARONE HCL 200 MG PO TABS
200.0000 mg | ORAL_TABLET | Freq: Every day | ORAL | Status: DC
  Administered 2015-09-27 – 2015-09-28 (×2): 200 mg via ORAL
  Filled 2015-09-27 (×2): qty 1

## 2015-09-27 MED ORDER — WHITE PETROLATUM GEL: 1.0000 "application " | Freq: Two times a day (BID) | Status: DC | PRN

## 2015-09-27 MED ORDER — PNEUMOCOCCAL VAC POLYVALENT 25 MCG/0.5ML IJ INJ
0.5000 mL | INJECTION | INTRAMUSCULAR | Status: AC
  Administered 2015-09-28: 0.5 mL via INTRAMUSCULAR
  Filled 2015-09-27: qty 0.5

## 2015-09-27 MED ORDER — COLCHICINE 0.6 MG PO TABS
0.6000 mg | ORAL_TABLET | Freq: Every day | ORAL | Status: DC
  Administered 2015-09-27 – 2015-09-28 (×2): 0.6 mg via ORAL
  Filled 2015-09-27 (×2): qty 1

## 2015-09-27 MED ORDER — LINACLOTIDE 145 MCG PO CAPS
290.0000 ug | ORAL_CAPSULE | Freq: Every day | ORAL | Status: DC
  Administered 2015-09-28: 290 ug via ORAL
  Filled 2015-09-27: qty 2

## 2015-09-27 MED ORDER — GABAPENTIN 400 MG PO CAPS
400.0000 mg | ORAL_CAPSULE | ORAL | Status: DC
  Administered 2015-09-27 – 2015-09-28 (×2): 400 mg via ORAL
  Filled 2015-09-27 (×2): qty 1

## 2015-09-27 MED ORDER — SODIUM CHLORIDE 0.9% FLUSH
3.0000 mL | Freq: Two times a day (BID) | INTRAVENOUS | Status: DC
  Administered 2015-09-27 – 2015-09-28 (×2): 3 mL via INTRAVENOUS

## 2015-09-27 SURGICAL SUPPLY — 12 items
BAG SNAP BAND KOVER 36X36 (MISCELLANEOUS) ×3 IMPLANT
CATH EZ STEER NAV 8MM F-J CUR (ABLATOR) ×3 IMPLANT
CATH JOSEPH QUAD ALLRED 6F REP (CATHETERS) ×6 IMPLANT
CATH WEBSTER BI DIR CS D-F CRV (CATHETERS) ×3 IMPLANT
PACK EP LATEX FREE (CUSTOM PROCEDURE TRAY) ×2
PACK EP LF (CUSTOM PROCEDURE TRAY) ×2 IMPLANT
PAD DEFIB LIFELINK (PAD) ×3 IMPLANT
PATCH CARTO3 (PAD) ×3 IMPLANT
SHEATH PINNACLE 6F 10CM (SHEATH) ×6 IMPLANT
SHEATH PINNACLE 7F 10CM (SHEATH) ×6 IMPLANT
SHEATH PINNACLE 8F 10CM (SHEATH) ×3 IMPLANT
SHIELD RADPAD SCOOP 12X17 (MISCELLANEOUS) ×3 IMPLANT

## 2015-09-27 NOTE — Progress Notes (Signed)
*  PRELIMINARY RESULTS* Echocardiogram Echocardiogram Transesophageal has been performed.  Leavy Cella 09/27/2015, 12:46 PM

## 2015-09-27 NOTE — CV Procedure (Signed)
TEE:  Pre flutter ablation done in EP lab During this procedure the patient is administered a total of Versed 4 mg and Fentanyl 75 mg to achieve and maintain moderate conscious sedation.  The patient's heart rate, blood pressure, and oxygen saturation are monitored continuously during the procedure. The period of conscious sedation is 17 minutes, of which I was present face-to-face 100% of this time.  LVE EF 30% diffuse hypokinesis Mild MR No LAA thrombus flutter velocity .9 m/sec PFO present  Mild RV enlargement Normal AV Redundant atrial septum with PFO MIld LAE  Dr Lovena Le to proceed with flutter ablation  Jenkins Rouge

## 2015-09-27 NOTE — Progress Notes (Signed)
6Fr, 7Fr, and 8Fr right femoral venous sheaths aspirated and removed.  Pressure held for 76mins by Eddie Dibbles, RN.  Hemostasis obtained, sites level 0.  Gauze and tegaderm dressing applied.  Right DP pulse palpable.  Instructions given to patient.  Patient verbalizes understanding.    Bedrest begins at 2:45pm.

## 2015-09-27 NOTE — Discharge Summary (Signed)
ELECTROPHYSIOLOGY PROCEDURE DISCHARGE SUMMARY    Patient ID: Frank Hoover,  MRN: [id number], DOB/AGE: Feb 11, 1951 65 y.o.  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  HTN 3.  COPD 4.  Type II diabetes 5.  Arthritis  No Known Allergies   Procedures This Admission: 1.  Electrophysiology study and radiofrequency catheter ablation on 09/27/15 by Dr Lovena Le.  This study demonstrated typical atrial flutter with successful CTI ablation as well as AVNRT with slow pathway modification.  There were no inducible arrhythmias following ablation and no early apparent complications.  2. TEE on 09/27/15 by Dr Johnsie Cancel demonstrated EF 30%, no LAA thrombus, +PFO  Brief HPI: Frank Hoover is a 65 y.o. male with a past medical history as outlined above.  He has documented atrial flutter as well as AVNRT.  Risks, benefits, and alternatives to ablation were reviewed with the patient who wished to proceed.   Hospital Course:  The patient was admitted and underwent EPS/RFCA with details as outlined above. He was monitored on telemetry overnight which demonstrated sinus rhythm with PVC's.  Groin and neck incisions were without complication.  They were examined by Dr Lovena Le who considered them stable for discharge to SNF.  Follow up will be arranged in 4 weeks.  Wound care and restrictions were reviewed with the patient prior to discharge.   Physical Exam: Vitals:   09/27/15 1525 09/27/15 1540 09/27/15 2051 09/28/15 0447  BP: 124/66 124/65 115/78 101/63  Pulse: 91 86 87 81  Resp: 13 10 14 16   Temp:   98.3 F (36.8 C) 98.1 F (36.7 C)  TempSrc:   Oral Oral  SpO2: 99% 98% 98% 98%  Weight:    221 lb 12.8 oz (100.6 kg)  Height:        GEN- The  patient is chronically ill appearing, alert and oriented x 3 today.   HEENT: normocephalic, atraumatic; sclera clear, conjunctiva pink; hearing intact; oropharynx clear; neck supple  Lungs- Clear to ausculation bilaterally, normal work of breathing.  No wheezes, rales, rhonchi Heart- Regular rate and rhythm  GI- soft, non-tender, non-distended, bowel sounds present  Extremities- no clubbing, cyanosis, or edema; DP/PT/radial pulses 2+ bilaterally, groin without hematoma/bruit MS- no significant deformity or atrophy Skin- warm and dry, no rash or lesion Psych- euthymic mood, full affect Neuro- strength and sensation are intact   Labs:   Lab Results  Component Value Date   WBC 4.0 09/27/2015   HGB 9.3 (L) 09/27/2015   HCT 29.5 (L) 09/27/2015   MCV 88.9 09/27/2015   PLT 269 09/27/2015     Recent Labs Lab 09/27/15 0845  NA 132*  K 3.2*  CL 98*  CO2 23  BUN 49*  CREATININE 2.58*  CALCIUM 9.0  GLUCOSE 162*    Discharge Medications:    Medication List    TAKE these medications   acetaminophen 500 MG tablet Commonly known as:  TYLENOL Take 1 tablet (500 mg total) by mouth every 6 (six) hours as needed for moderate pain.   allopurinol 300 MG tablet Commonly known as:  ZYLOPRIM Take 300 mg by mouth daily.   amiodarone 200 MG tablet Commonly known as:  PACERONE Take 1 tablet (200 mg total) by mouth daily.   amLODipine 5 MG tablet Commonly known as:  NORVASC Take 5 mg by mouth daily.   apixaban 5 MG Tabs tablet Commonly known as:  ELIQUIS Take 1 tablet (5 mg total) by mouth 2 (two) times daily.   atorvastatin 20 MG tablet Commonly known as:  LIPITOR Take 20 mg by mouth daily.   cloNIDine 0.2 MG tablet Commonly known as:  CATAPRES Take 0.2 mg by mouth daily.   colchicine 0.6 MG tablet Take 0.6 mg by mouth daily.   collagenase ointment Commonly known as:  SANTYL Apply topically daily. What changed:  how much to take   feeding supplement (ENSURE ENLIVE)  Liqd Take 237 mLs by mouth 3 (three) times daily between meals.   feeding supplement (PRO-STAT SUGAR FREE 64) Liqd Take 30 mLs by mouth 3 (three) times daily between meals.   furosemide 40 MG tablet Commonly known as:  LASIX Take 1 tablet (40 mg total) by mouth 2 (two) times daily.   gabapentin 400 MG capsule Commonly known as:  NEURONTIN Take 400 mg by mouth 2 (two) times daily. Takes at noon & bedtime   gabapentin 300 MG capsule Commonly known as:  NEURONTIN Take 300 mg by mouth daily.   glimepiride 2 MG tablet Commonly known as:  AMARYL Take 2 mg by mouth daily.   HUMALOG KWIKPEN 100 UNIT/ML KiwkPen Generic drug:  insulin lispro Inject 0-15 Units into the skin See admin instructions. Per sliding scale   hydrALAZINE 25 MG tablet Commonly known as:  APRESOLINE Take 3 tablets (75 mg total) by mouth every 8 (eight) hours. What changed:  how much to take  when to take this  Another medication with the same name was removed. Continue taking this medication, and follow the directions you see here.   HYDROcodone-acetaminophen 5-325 MG tablet Commonly known as:  NORCO/VICODIN Take 1 tablet by mouth every 6 (six) hours as needed for severe pain.   insulin aspart 100 UNIT/ML injection Commonly known as:  novoLOG Inject 0-15 Units into the skin 3 (three) times daily with meals.   isosorbide mononitrate 60 MG 24 hr tablet Commonly known as:  IMDUR Take 1 tablet (60 mg total) by mouth daily.   lanolin ointment Apply 1 application topically 2 (two) times daily as needed for dry skin.   LINZESS 290 MCG Caps capsule Generic drug:  linaclotide TAKE 1 CAPSULE ONCE DAILY 30 MINUTES BEFORE MEALS   loperamide 2 MG capsule Commonly known as:  IMODIUM Take 2 mg by mouth daily as needed for constipation.   metoprolol 100 MG tablet Commonly known as:  LOPRESSOR Take 100 mg by mouth 2 (two) times daily.   multivitamin with minerals Tabs tablet Take 1 tablet by mouth daily.     omeprazole 20 MG capsule Commonly known as:  PRILOSEC Take 20 mg by mouth daily.   oxybutynin 5 MG tablet Commonly known as:  DITROPAN Take 1 tablet (5 mg total) by mouth 3 (three) times daily.   pantoprazole 40 MG tablet Commonly known as:  PROTONIX Take 1 tablet (40 mg total) by mouth daily.   PROAIR HFA 108 (90 Base) MCG/ACT inhaler Generic drug:  albuterol Inhale 2 puffs into the lungs every 6 (six) hours as needed for wheezing or shortness of breath.   senna-docusate 8.6-50 MG tablet Commonly known as:  Senokot-S Take 1 tablet by mouth 2 (two) times daily.   TOUJEO SOLOSTAR 300 UNIT/ML Sopn Generic drug:  Insulin Glargine 15 Units every morning. Take 15- 25 units daily as needed   VITAMIN D PO  Take 1,000 Units by mouth daily.   zolpidem 10 MG tablet Commonly known as:  AMBIEN Take 10 mg by mouth at bedtime.       Disposition:  Discharge Instructions    Diet - low sodium heart healthy    Complete by:  As directed   Discharge instructions    Complete by:  As directed   No lifting over 5 lbs for 1 week. Keep procedure site clean & dry. If you notice increased pain, swelling, bleeding or pus, call/return!  You may shower, but no soaking baths/hot tubs/pools for 1 week.   Increase activity slowly    Complete by:  As directed     Follow-up Information    Cristopher Peru, MD Follow up on 10/26/2015.   Specialty:  Cardiology Why:  at 10:15AM  Contact information: [place] [place] [address] [phone]           Duration of Discharge Encounter: Greater than 30 minutes including physician time.  Signed, Chanetta Marshall, NP 09/28/2015 7:10 AM   EP Attending  Patient seen and examined. Agree with above. The patient is doing well after ablation and stable for DC. Usual followup as outlined above.  Mikle Bosworth.D.

## 2015-09-27 NOTE — Progress Notes (Signed)
7Fr right internal jugular venous sheath aspirated and removed.  Pressure held for 3mins by Eddie Dibbles, RN.  Hemostasis obtained, site level 0.  Gauze and tegaderm dressing applied.  Instructions given to patient.  Patient verbalizes understanding.

## 2015-09-27 NOTE — H&P (Addendum)
ELECTROPHYSIOLOGY CONSULT NOTE    Patient ID: Frank Hoover MRN: [id number], DOB/AGE: 64-Aug-1952 64 y.o.  Admit date: 08/14/2015 Date of Consult: 08/23/2015  Primary Physician: Alonza Bogus, MD Primary Cardiologist: Dr. Domenic Polite (new), Dr. Martinique Requesting MD: Dr. Martinique  Reason for Consultation: PSVT, AFlutter RVR  HPI: Frank Hoover is a 65 y.o. male was originally seen by nephrologist who apparently noted the pt to have fast HR and edema, was sent for an echo, he was referred then from the echo lab to the ER at [facility] 08/15/15, there noted to have AFlutter with RVR (new for him), the echo showed EF 25% as a new finding for him, he was hypokalemic with K+ 2.9, noted with ARF on CRRI with Creat 2.4, his BS was 54 and was admitted, the next day developed shaking chills with temp 104 with diminished mentation, and treatment for sepsis started, CCM had suspicion of aspiration since he had apparently had a vomiting episode while wearing  BIPAP, and ultimately was intubated, 08/15/15 with worsening respiratory distress.  Transferred to [facility] for further care and management 08/15/15, intubated and on pressor support.  He continued to have PAFlutter with RVR, he was started on heparin gtt (though stopped briefly with low plts, this ultimately felt secondary to his sepsis and resumed) and amio boluses and PRN BB were used for rate control, 08/19/15 he was able to wean off pressors.  08/20/15 extubated, and labs/condition noted to be improving  PMHx includes COPD, HTN, CRI stage III, HLD  He is feeling much better, has the hiccups this afternoon, no CP, palpitations or SOB.       Past Medical History  Diagnosis Date  . Gout   . COPD (chronic obstructive pulmonary disease) ([place])   . Type 2 diabetes mellitus ([place])   . GERD (gastroesophageal reflux disease)   . Essential hypertension   . Hyperlipemia   . OA (osteoarthritis)   . Gastroparesis   . Adenomatous colon  polyp 10/30/09    Serrated adenoma removed during Colonoscopy   . Diverticulosis of colon   . CKD (chronic kidney disease) stage 3, GFR 30-59 ml/min      Surgical History:        Past Surgical History  Procedure Laterality Date  . Tonsillectomy    . Cataract extraction Right   . Esophagogastroduodenoscopy  10/30/09    TDS:KAJGOT   . Colonoscopy  10/30/09    LXB:WIOM-BTDHR diverticulum/serrated adenoma from ICV, next colonoscopy due 10/2012  . Colonoscopy N/A 09/18/2012    CBU:LAGTXMI mucosa that was seen appeared normal, however most of it was not seen due to be very poor prep  . Colonoscopy N/A 04/08/2013    WOE:HOZYYQM polyp removed/inadequate preparation  . Colonoscopy N/A 06/22/2014    Procedure: COLONOSCOPY;  Surgeon: Daneil Dolin, MD;  Location: [facility];  Service: Endoscopy;  Laterality: N/A;  115            Prescriptions prior to admission  Medication Sig Dispense Refill Last Dose  . acetaminophen (TYLENOL) 500 MG tablet Take 1 tablet (500 mg total) by mouth every 6 (six) hours as needed for moderate pain. 30 tablet 0 unknown  . albuterol (PROAIR HFA) 108 (90 BASE) MCG/ACT inhaler Inhale 2 puffs into the lungs every 6 (six) hours as needed for wheezing or shortness of breath.    unknown  . allopurinol (ZYLOPRIM) 300 MG tablet Take 300 mg by mouth daily.     unknown at Unknown time  .  amLODipine (NORVASC) 10 MG tablet Take 10 mg by mouth daily.   unknown at Unknown time  . atorvastatin (LIPITOR) 20 MG tablet Take 20 mg by mouth daily.   unknown at Unknown time  . Cholecalciferol (VITAMIN D PO) Take 1,000 Units by mouth daily.   unknown at Unknown time  . cloNIDine (CATAPRES) 0.2 MG tablet Take 0.2 mg by mouth 3 (three) times daily.    unknown at Unknown time  . colchicine 0.6 MG tablet Take 0.6 mg by mouth daily.   unknown at Unknown time  . furosemide (LASIX) 40 MG tablet Take 1 tablet (40 mg total) by mouth daily. (Patient taking  differently: Take 40-80 mg by mouth 2 (two) times daily. 2 tablets in the morning and 1 tablet in the evening.) 30 tablet 12 unknown at Unknown time  . gabapentin (NEURONTIN) 400 MG capsule Take 400 mg by mouth 2 (two) times daily. Takes at noon & bedtime   unknown at Unknown time  . glimepiride (AMARYL) 2 MG tablet Take 2 mg by mouth daily before breakfast.     unknown at Unknown time  . HUMALOG KWIKPEN 100 UNIT/ML KiwkPen Inject 1-54 Units into the skin 3 (three) times daily before meals. Per sliding scale   unknown  . hydrALAZINE (APRESOLINE) 50 MG tablet Take 1 tablet (50 mg total) by mouth 2 (two) times daily. 60 tablet 12 unknown at Unknown time  . LINZESS 290 MCG CAPS capsule TAKE 1 CAPSULE ONCE DAILY 30 MINUTES BEFORE MEALS 90 capsule 3 unknown at Unknown time  . metoprolol (LOPRESSOR) 100 MG tablet Take 100 mg by mouth 2 (two) times daily.    unknown at Unknown time  . omeprazole (PRILOSEC) 20 MG capsule Take 20 mg by mouth daily.   unknown at Unknown time  . TOUJEO SOLOSTAR 300 UNIT/ML SOPN Inject 15 Units into the skin at bedtime.   unknown at Unknown time  . zolpidem (AMBIEN) 10 MG tablet Take 10 mg by mouth at bedtime.    unknown at Unknown time  . HYDROcodone-acetaminophen (NORCO/VICODIN) 5-325 MG per tablet Take 1 tablet by mouth every 6 (six) hours as needed for moderate pain.    unknown  . lanolin ointment Apply 1 application topically 2 (two) times daily as needed for dry skin.   unknown    Inpatient Medications:  . amiodarone  400 mg Oral BID  . cloNIDine  0.1 mg Oral BID  . feeding supplement (GLUCERNA SHAKE)  237 mL Oral BID BM  . furosemide  40 mg Oral BID  . insulin aspart  0-15 Units Subcutaneous TID WC  . insulin glargine  32 Units Subcutaneous Daily  . metoprolol tartrate  50 mg Oral BID  . pantoprazole  40 mg Oral Daily  . sodium chloride flush  3 mL Intravenous Q12H  . warfarin  10 mg Oral ONCE-1800  . Warfarin - Pharmacist Dosing Inpatient    Does not apply q1800    Allergies: No Known Allergies  Social History        Social History  . Marital Status: Married    Spouse Name: N/A  . Number of Children: 2  . Years of Education: N/A       Occupational History  . retired         Social History Main Topics  . Smoking status: Former Smoker -- 1.00 packs/day for 4 years    Types: Cigarettes    Quit date: 08/26/2010  . Smokeless tobacco: Not on file  .  Alcohol Use: No  . Drug Use: No  . Sexual Activity: Not on file       Other Topics Concern  . Not on file   Social History Narrative          Family History  Problem Relation Age of Onset  . Diabetes Mother   . COPD Father   . Hypertension Mother   . Colon cancer Neg Hx      Review of Systems: All other systems reviewed and are otherwise negative except as noted above.  Physical Exam:       Filed Vitals:   08/23/15 0900 08/23/15 1000 08/23/15 1100 08/23/15 1125  BP: 150/68 151/72 144/83   Pulse: 84 94 94   Temp:    97.4 F (36.3 C)  TempSrc:    Oral  Resp: _0 Height:      Weight:      SpO2: 99% 97% 100%     GEN- The patient is obese, somewhat ill appearing, alert and oriented x 3 today.   HEENT: normocephalic, atraumatic; sclera clear, conjunctiva pink; hearing intact; oropharynx clear; neck supple, no JVP Lymph- no cervical lymphadenopathy Lungs- Clear to ausculation bilaterally, normal work of breathing.  No wheezes, rales, rhonchi Heart- Regular rate and rhythm, no murmurs, rubs or gallops, PMI not laterally displaced GI- soft, non-tender, non-distended, bowel sounds present, GI rectal bag in place. Extremities- no clubbing, cyanosis, or edema MS- no significant deformity or atrophy Skin- warm and dry, no rash or lesion Psych- euthymic mood, full affect Neuro- no gross deficits observed  Labs:   Recent Labs       Lab Results  Component Value Date   WBC 10.2 08/23/2015   HGB 8.8*  08/23/2015   HCT 27.8* 08/23/2015   MCV 87.7 08/23/2015   PLT 195 08/23/2015      Recent Labs Lab 08/17/15 0438  08/23/15 0250  NA 131*  < > 138  K 4.6  < > 3.6  CL 93*  < > 102  CO2 19*  < > 27  BUN 72*  < > 73*  CREATININE 5.32*  < > 3.02*  CALCIUM 6.5*  < > 8.1*  PROT 4.5*  --   --   BILITOT 1.1  --   --   ALKPHOS 80  --   --   ALT 89*  --   --   AST 109*  --   --   GLUCOSE 309*  < > 248*  < > = values in this interval not displayed.    Radiology/Studies: Dg Chest Port 1 View 08/20/2015  CLINICAL DATA:  Patient with history of ETT. EXAM: PORTABLE CHEST 1 VIEW COMPARISON:  Chest radiograph 08/19/2015. FINDINGS: Right IJ central venous catheter tip projects over the superior vena cava. ET tube terminates in the distal trachea. Enteric tube tip and side-port project over the stomach. Low lung volumes. Stable enlarged cardiac and mediastinal contours. Interval increase left mid lower lung heterogeneous opacities with small layering left pleural effusion. Unchanged bandlike opacity right mid lung. IMPRESSION: Stable support apparatus. Interval increase heterogeneous opacities left mid lower lung most compatible with atelectasis and small left pleural effusion. Probable right mid lung subsegmental atelectasis. Electronically Signed   By: Lovey Newcomer M.D.   On: 08/20/2015 08:22      EKG: 6/12/17AFlutter 124bpm 08/23/15 SVT 175bpm  October SR, 1st degree AVBlock  TELEMETRY: currently SR 80's, PAFlutter with variable rates through his stay>>> SR yesterday s/p  DCCV, 2 episode PSVT rates 180's today  08/14/15: Echocardiogram Study Conclusions - Left ventricle: The cavity size was normal. Wall thickness was  increased in a pattern of mild LVH. The estimated ejection  fraction was 25%. Study telemetry shows a regular tachycardia  suggestive of atrial flutter. Diffuse hypokinesis. Probable  akinesis of the inferoseptal myocardium. The study is not  technically  sufficient to allow evaluation of LV diastolic  function. - Aortic valve: Mildly calcified annulus. Trileaflet. - Mitral valve: There was trivial regurgitation. - Left atrium: The atrium was mildly dilated. - Right ventricle: Systolic function was moderately reduced. - Right atrium: The atrium was at the upper limits of normal in  size. Central venous pressure (est): 8 mm Hg. - Atrial septum: There was a patent foramen ovale. Bidirectional  shunting noted by color Doppler. - Tricuspid valve: There was mild regurgitation. - Pulmonary arteries: PA peak pressure: 31 mm Hg (S). - Pericardium, extracardiac: There was no pericardial effusion. Impressions: - Mild LVH with LVEF approximately 25%, diffuse hypokinesis with  probable akinesis of the inferior septum. Left ventricular  dysfunction is new in comparison to the previous study from 2015.  Study telemetry looks to show atrial flutter with RVR.  Indeterminate diastolic function. Mild left atrial enlargement.  Trivial mitral regurgitation. Moderately reduced RV contraction.  Mild tricuspid regurgitation with PASP 31 mmHg. 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  Assessment and Plan:  1. PSVT     Responded to carotid massage, recurrent episode broke with adenosine 77m 2. PAFlutter, new for the patient in setting of septic shock     TEE/DCCV 08/22/15     CHA2DS2Vasc is at least 3, currently on heparin gtt/warfarin     H/H 8.8/27.8 (trending down)     plts 195 (trending up)     INR 1.36  He has been on amio gtt started 08/18/15, PO started last PM, intermitenly has gotten lopressor doses, routinly started today 56mBID, for now, follow his response to the BB, BP has room to tolerate, could consider ablation in the future, if recurrent and overall condition continues to improve and allow.  Patient states he has had "racing heart beats" in the past, sees or [place] name  3. Septic/cardiogenic shock, much improved     citrobacter     UTI     Colitis  4. Respiratory failure     Extubated 08/20/15  4. Cardiomyopathy (new)     Will defer ischemic evaluation/timing to primary cards team     Mild Trop elevation (max 0.28) at the start of his hospital stay felt demand secondary to overall condition and rapid HR     Fluid negative x4 days, though cumulatively only negative - [id number]. ARI/CRI      Peak Creat 5.37, today is 3.02  6. PFO with bidirectional shunting on his echo     PA pressure 42m52m Signed, ReneTommye Standard-C 08/23/2015 12:30 PM  EP Attending  Patient seen and examined. Agree with above with minimal modification. Exam reveals a middle aged obese man, NAD with a rectal bag, Clear lungs and a RRR. Trace edema. The patient has had incessant atrial flutter but has also had recurrent SVT, likely AVNRT, which has been stopped by both vagal maneuvers as well as IV adenosine. He has myriad other medical problems though he is gradually improving. If he recovers, I would recommend catheter ablation of both his SVT and  Atrial flutter. For now, his GI and infectious issues will need resolving. Consider switching anti-coagulation to a NOAC once his creatinine gets to baseline. Ok to continue his anti-arrhythmic drug therapy for now. If his SVT recurs and cannot be controlled, then would consider ablation before discharge from the hospital.  Ponciano Ort.   EP attending  Patient seen and examined. Agree with above. Since his discharge from the hospital, he has stabilized and returns today for EP Study and catheter ablation of both SVT and atrial flutter.   Shareena Nusz,M.D.  EP Addendum  The patient was placed on the table this morning and has been found to have atrial flutter with a CVR. He will undergo TEE prior to catheter ablation as we do not know how long he has been in atrial flutter.  Mikle Bosworth.D.

## 2015-09-28 DIAGNOSIS — N3281 Overactive bladder: Secondary | ICD-10-CM | POA: Diagnosis not present

## 2015-09-28 DIAGNOSIS — J189 Pneumonia, unspecified organism: Secondary | ICD-10-CM | POA: Diagnosis not present

## 2015-09-28 DIAGNOSIS — K559 Vascular disorder of intestine, unspecified: Secondary | ICD-10-CM | POA: Diagnosis not present

## 2015-09-28 DIAGNOSIS — G4733 Obstructive sleep apnea (adult) (pediatric): Secondary | ICD-10-CM | POA: Diagnosis not present

## 2015-09-28 DIAGNOSIS — Z9889 Other specified postprocedural states: Secondary | ICD-10-CM | POA: Diagnosis not present

## 2015-09-28 DIAGNOSIS — K529 Noninfective gastroenteritis and colitis, unspecified: Secondary | ICD-10-CM | POA: Diagnosis not present

## 2015-09-28 DIAGNOSIS — K921 Melena: Secondary | ICD-10-CM | POA: Diagnosis not present

## 2015-09-28 DIAGNOSIS — M6281 Muscle weakness (generalized): Secondary | ICD-10-CM | POA: Diagnosis not present

## 2015-09-28 DIAGNOSIS — E1143 Type 2 diabetes mellitus with diabetic autonomic (poly)neuropathy: Secondary | ICD-10-CM | POA: Diagnosis not present

## 2015-09-28 DIAGNOSIS — Z79899 Other long term (current) drug therapy: Secondary | ICD-10-CM | POA: Diagnosis not present

## 2015-09-28 DIAGNOSIS — M109 Gout, unspecified: Secondary | ICD-10-CM | POA: Diagnosis not present

## 2015-09-28 DIAGNOSIS — R7881 Bacteremia: Secondary | ICD-10-CM | POA: Diagnosis not present

## 2015-09-28 DIAGNOSIS — I10 Essential (primary) hypertension: Secondary | ICD-10-CM | POA: Diagnosis not present

## 2015-09-28 DIAGNOSIS — E1169 Type 2 diabetes mellitus with other specified complication: Secondary | ICD-10-CM | POA: Diagnosis not present

## 2015-09-28 DIAGNOSIS — K219 Gastro-esophageal reflux disease without esophagitis: Secondary | ICD-10-CM | POA: Diagnosis not present

## 2015-09-28 DIAGNOSIS — K5900 Constipation, unspecified: Secondary | ICD-10-CM | POA: Diagnosis not present

## 2015-09-28 DIAGNOSIS — Z8679 Personal history of other diseases of the circulatory system: Secondary | ICD-10-CM | POA: Diagnosis not present

## 2015-09-28 DIAGNOSIS — G4730 Sleep apnea, unspecified: Secondary | ICD-10-CM | POA: Diagnosis not present

## 2015-09-28 DIAGNOSIS — I471 Supraventricular tachycardia: Secondary | ICD-10-CM | POA: Diagnosis not present

## 2015-09-28 DIAGNOSIS — I509 Heart failure, unspecified: Secondary | ICD-10-CM | POA: Diagnosis not present

## 2015-09-28 DIAGNOSIS — E669 Obesity, unspecified: Secondary | ICD-10-CM | POA: Diagnosis not present

## 2015-09-28 DIAGNOSIS — K922 Gastrointestinal hemorrhage, unspecified: Secondary | ICD-10-CM | POA: Diagnosis not present

## 2015-09-28 DIAGNOSIS — N179 Acute kidney failure, unspecified: Secondary | ICD-10-CM | POA: Diagnosis not present

## 2015-09-28 DIAGNOSIS — I5021 Acute systolic (congestive) heart failure: Secondary | ICD-10-CM | POA: Diagnosis not present

## 2015-09-28 DIAGNOSIS — I483 Typical atrial flutter: Secondary | ICD-10-CM

## 2015-09-28 DIAGNOSIS — R5381 Other malaise: Secondary | ICD-10-CM | POA: Diagnosis not present

## 2015-09-28 DIAGNOSIS — R0902 Hypoxemia: Secondary | ICD-10-CM | POA: Diagnosis not present

## 2015-09-28 DIAGNOSIS — G9340 Encephalopathy, unspecified: Secondary | ICD-10-CM | POA: Diagnosis not present

## 2015-09-28 DIAGNOSIS — R112 Nausea with vomiting, unspecified: Secondary | ICD-10-CM | POA: Diagnosis not present

## 2015-09-28 DIAGNOSIS — I4892 Unspecified atrial flutter: Secondary | ICD-10-CM | POA: Diagnosis not present

## 2015-09-28 DIAGNOSIS — M1990 Unspecified osteoarthritis, unspecified site: Secondary | ICD-10-CM | POA: Diagnosis not present

## 2015-09-28 DIAGNOSIS — E1122 Type 2 diabetes mellitus with diabetic chronic kidney disease: Secondary | ICD-10-CM | POA: Diagnosis not present

## 2015-09-28 DIAGNOSIS — Z5181 Encounter for therapeutic drug level monitoring: Secondary | ICD-10-CM | POA: Diagnosis present

## 2015-09-28 DIAGNOSIS — D649 Anemia, unspecified: Secondary | ICD-10-CM | POA: Diagnosis not present

## 2015-09-28 DIAGNOSIS — E039 Hypothyroidism, unspecified: Secondary | ICD-10-CM | POA: Diagnosis not present

## 2015-09-28 DIAGNOSIS — I429 Cardiomyopathy, unspecified: Secondary | ICD-10-CM | POA: Diagnosis not present

## 2015-09-28 DIAGNOSIS — Z87891 Personal history of nicotine dependence: Secondary | ICD-10-CM | POA: Diagnosis not present

## 2015-09-28 DIAGNOSIS — R2681 Unsteadiness on feet: Secondary | ICD-10-CM | POA: Diagnosis not present

## 2015-09-28 DIAGNOSIS — G9341 Metabolic encephalopathy: Secondary | ICD-10-CM | POA: Diagnosis not present

## 2015-09-28 DIAGNOSIS — Z23 Encounter for immunization: Secondary | ICD-10-CM | POA: Diagnosis not present

## 2015-09-28 DIAGNOSIS — I5022 Chronic systolic (congestive) heart failure: Secondary | ICD-10-CM | POA: Diagnosis not present

## 2015-09-28 DIAGNOSIS — R6521 Severe sepsis with septic shock: Secondary | ICD-10-CM | POA: Diagnosis not present

## 2015-09-28 DIAGNOSIS — J9600 Acute respiratory failure, unspecified whether with hypoxia or hypercapnia: Secondary | ICD-10-CM | POA: Diagnosis not present

## 2015-09-28 DIAGNOSIS — I130 Hypertensive heart and chronic kidney disease with heart failure and stage 1 through stage 4 chronic kidney disease, or unspecified chronic kidney disease: Secondary | ICD-10-CM | POA: Diagnosis not present

## 2015-09-28 DIAGNOSIS — J449 Chronic obstructive pulmonary disease, unspecified: Secondary | ICD-10-CM | POA: Diagnosis not present

## 2015-09-28 DIAGNOSIS — Q211 Atrial septal defect: Secondary | ICD-10-CM | POA: Diagnosis not present

## 2015-09-28 DIAGNOSIS — Z6835 Body mass index (BMI) 35.0-35.9, adult: Secondary | ICD-10-CM | POA: Diagnosis not present

## 2015-09-28 DIAGNOSIS — N183 Chronic kidney disease, stage 3 (moderate): Secondary | ICD-10-CM | POA: Diagnosis not present

## 2015-09-28 DIAGNOSIS — E785 Hyperlipidemia, unspecified: Secondary | ICD-10-CM | POA: Diagnosis not present

## 2015-09-28 LAB — GLUCOSE, CAPILLARY
Glucose-Capillary: 106 mg/dL — ABNORMAL HIGH (ref 65–99)
Glucose-Capillary: 190 mg/dL — ABNORMAL HIGH (ref 65–99)

## 2015-09-28 MED ORDER — APIXABAN 5 MG PO TABS: 5.0000 mg | ORAL_TABLET | Freq: Two times a day (BID) | ORAL | 1 refills | Status: DC

## 2015-09-28 MED FILL — Bupivacaine HCl Preservative Free (PF) Inj 0.25%: INTRAMUSCULAR | Qty: 30 | Status: AC

## 2015-09-28 NOTE — Plan of Care (Signed)
Problem: Pain Managment: Goal: General experience of comfort will improve Outcome: Completed/Met Date Met: 09/28/15 Pt has pain from sacral ulcer and pain is being relieved by p.o. Pain medications   Problem: Education: Goal: Knowledge of disease or condition will improve Outcome: Completed/Met Date Met: 09/28/15 Pt educated about a-flutter  Problem: Education: Goal: Knowledge of [facility] Education information/materials will improve Outcome: Completed/Met Date Met: 09/28/15 Pt educated throughout entire hospitalization regarding tests, procedures, labs, medications, and available resources

## 2015-09-28 NOTE — NC FL2 (Signed)
[place] LEVEL OF CARE SCREENING TOOL     IDENTIFICATION  Patient Name: Frank Hoover Birthdate: Jul 12, 1950 Sex: male Admission Date (Current Location): 09/27/2015  [facility] and [place] Number:  [employer] and Address:  [facility], [place] [address] [place], [place] [address]      Provider Number: [id number]  Attending Physician Name and Address:  Evans Lance, MD  Relative Name and Phone Number:       Current Level of Care: Hospital Recommended Level of Care: [place] Prior Approval Number:    Date Approved/Denied:   PASRR Number: [id number]  Discharge Plan: SNF    Current Diagnoses: Patient Active Problem List   Diagnosis Date Noted  . Atrial flutter ([place]) 09/27/2015  . Pressure ulcer 09/05/2015  . Abdominal pain   . Atrial fibrillation with RVR ([place])   . HCAP (healthcare-associated pneumonia)   . Acute renal failure superimposed on stage 3 chronic kidney disease ([place])   . Metabolic encephalopathy   . Abdominal distension   . Urinary tract infection, site not specified   . Cardiogenic shock ([place])   . Acute systolic CHF (congestive heart failure) ([place])   . Uncontrolled type 2 diabetes mellitus with complication ([place])   . Urinary retention   . Bacteremia   . Ischemic colitis ([place])   . Noninfectious gastroenteritis, unspecified   . Proctosigmoiditis   . Lower GI bleed   . SVT (supraventricular tachycardia) ([place]) 08/24/2015  . Debility   . Blood in stool   . Acute blood loss anemia   . Abnormal CT scan, colon   . Acute systolic heart failure ([place])   . Acute kidney injury ([place])   . Acute respiratory failure ([place])   . Septic shock ([place])   . Acute encephalopathy   . Atrial flutter with rapid ventricular response ([place]) 08/14/2015  . Systolic CHF ([place]) [id number]  . History of colonic polyps   . Dehydration 12/05/2013  . Acute renal failure ([place]) 12/02/2013  . Sleep apnea 12/02/2013  .  Hyponatremia 12/02/2013  . Acute on chronic renal failure ([place]) 12/02/2013  . COPD (chronic obstructive pulmonary disease) ([place])   . DM (diabetes mellitus) ([place])   . HTN (hypertension)   . Hyperlipemia   . CRF (chronic renal failure)   . Hepatomegaly 03/25/2013  . Dysphagia 12/06/2010  . History of adenomatous polyp of colon 09/21/2010  . Gastroparesis 09/21/2010  . Obesity 09/21/2010  . GERD 10/09/2009  . Constipation 10/09/2009  . ABDOMINAL PAIN-EPIGASTRIC 10/09/2009    Orientation RESPIRATION BLADDER Height & Weight     Self, Time, Situation, Place  O2 (3L) Indwelling catheter Weight: 221 lb 12.8 oz (100.6 kg) Height:  5\' 6"  (167.6 cm)  BEHAVIORAL SYMPTOMS/MOOD NEUROLOGICAL BOWEL NUTRITION STATUS      Incontinent Diet (Heart Healthy)  AMBULATORY STATUS COMMUNICATION OF NEEDS Skin   Extensive Assist Verbally PU Stage and Appropriate Care     PU Stage 3 Dressing:  (PRN Dressing)                 Personal Care Assistance Level of Assistance  Bathing, Feeding, Dressing Bathing Assistance: Limited assistance Feeding assistance: Limited assistance Dressing Assistance: Limited assistance     Functional Limitations Info  Sight, Hearing, Speech Sight Info: Adequate Hearing Info: Adequate Speech Info: Adequate    SPECIAL CARE FACTORS FREQUENCY  PT (By licensed PT), OT (By licensed OT)     PT Frequency: 5/week OT Frequency: 5/week  Contractures Contractures Info: Not present    Additional Factors Info  Code Status, Allergies, Insulin Sliding Scale Code Status Info: Full Allergies Info: NKA   Insulin Sliding Scale Info: 3/day       Current Medications (09/28/2015):  This is the current hospital active medication list Current Facility-Administered Medications  Medication Dose Route Frequency Provider Last Rate Last Dose  . 0.9 %  sodium chloride infusion  250 mL Intravenous PRN Evans Lance, MD      . acetaminophen (TYLENOL) tablet 650 mg  650  mg Oral Q4H PRN Evans Lance, MD      . albuterol (PROVENTIL) (2.5 MG/3ML) 0.083% nebulizer solution 3 mL  3 mL Inhalation Q6H PRN Evans Lance, MD      . allopurinol (ZYLOPRIM) tablet 300 mg  300 mg Oral Daily Evans Lance, MD   300 mg at 09/28/15 0802  . amiodarone (PACERONE) tablet 200 mg  200 mg Oral Daily Evans Lance, MD   200 mg at 09/28/15 0919  . amLODipine (NORVASC) tablet 5 mg  5 mg Oral Daily Evans Lance, MD   5 mg at 09/28/15 0919  . apixaban (ELIQUIS) tablet 5 mg  5 mg Oral BID Evans Lance, MD   5 mg at 09/28/15 0803  . atorvastatin (LIPITOR) tablet 20 mg  20 mg Oral Daily Evans Lance, MD   20 mg at 09/28/15 0804  . cloNIDine (CATAPRES) tablet 0.2 mg  0.2 mg Oral Daily Evans Lance, MD   0.2 mg at 09/28/15 0919  . colchicine tablet 0.6 mg  0.6 mg Oral Daily Evans Lance, MD   0.6 mg at 09/28/15 0919  . feeding supplement (ENSURE ENLIVE) (ENSURE ENLIVE) liquid 237 mL  237 mL Oral TID BM Evans Lance, MD      . feeding supplement (PRO-STAT SUGAR FREE 64) liquid 30 mL  30 mL Oral TID BM Evans Lance, MD      . furosemide (LASIX) tablet 40 mg  40 mg Oral BID Evans Lance, MD   40 mg at 09/28/15 0804  . gabapentin (NEURONTIN) capsule 300 mg  300 mg Oral Daily Evans Lance, MD   300 mg at 09/28/15 0804  . gabapentin (NEURONTIN) capsule 400 mg  400 mg Oral 2 times per day Evans Lance, MD   400 mg at 09/27/15 2136  . glimepiride (AMARYL) tablet 2 mg  2 mg Oral Daily Evans Lance, MD   2 mg at 09/28/15 0802  . hydrALAZINE (APRESOLINE) tablet 50 mg  50 mg Oral BID Evans Lance, MD   50 mg at 09/28/15 0919  . HYDROcodone-acetaminophen (NORCO/VICODIN) 5-325 MG per tablet 1 tablet  1 tablet Oral Q6H PRN Evans Lance, MD   1 tablet at 09/27/15 2212  . insulin aspart (novoLOG) injection 0-15 Units  0-15 Units Subcutaneous TID WC Evans Lance, MD   2 Units at 09/27/15 1700  . insulin glargine (LANTUS) injection 15 Units  15 Units Subcutaneous q morning - 10a  Evans Lance, MD      . isosorbide mononitrate (IMDUR) 24 hr tablet 60 mg  60 mg Oral Daily Evans Lance, MD   60 mg at 09/28/15 0803  . linaclotide (LINZESS) capsule 290 mcg  290 mcg Oral QAC breakfast Evans Lance, MD   290 mcg at 09/28/15 0617  . loperamide (IMODIUM) capsule 2 mg  2 mg Oral PRN Champ Mungo  Lovena Le, MD      . metoprolol (LOPRESSOR) tablet 100 mg  100 mg Oral BID Evans Lance, MD   100 mg at 09/27/15 2135  . multivitamin with minerals tablet 1 tablet  1 tablet Oral Daily Evans Lance, MD   1 tablet at 09/28/15 0802  . ondansetron (ZOFRAN) injection 4 mg  4 mg Intravenous Q6H PRN Evans Lance, MD      . oxybutynin [facility]) tablet 5 mg  5 mg Oral TID Evans Lance, MD   5 mg at 09/28/15 0919  . pantoprazole (PROTONIX) EC tablet 40 mg  40 mg Oral Daily Evans Lance, MD   40 mg at 09/28/15 0919  . senna-docusate (Senokot-S) tablet 1 tablet  1 tablet Oral BID Evans Lance, MD   1 tablet at 09/27/15 2136  . sodium chloride flush (NS) 0.9 % injection 3 mL  3 mL Intravenous Q12H Evans Lance, MD   3 mL at 09/28/15 1000  . sodium chloride flush (NS) 0.9 % injection 3 mL  3 mL Intravenous PRN Evans Lance, MD      . white petrolatum (VASELINE) gel 1 application  1 application Topical BID PRN Evans Lance, MD      . zolpidem Lorrin Mais) tablet 10 mg  10 mg Oral QHS Evans Lance, MD   10 mg at 09/27/15 2136     Discharge Medications: Please see discharge summary for a list of discharge medications.  Relevant Imaging Results:  Relevant Lab Results:   Additional Information SSN [id number]    Barbette Or, Scissors

## 2015-09-28 NOTE — Consult Note (Addendum)
[place] Nurse wound consult note Reason for Consult: Pt familiar to [facility] team from recent admission last month. Previous bilat stage 2 pressure injury to buttocks and unstageable pressure injury to sacrum have merged and present as one entire wound at this time in a butterfly shape. Wound type: Stage 3 pressure injury Pressure Ulcer POA: Yes Measurement: 6X9X2cm, with tunneling to wound edges near sacrum to .5cm Wound bed: 95% red and moist, 5% dark red Drainage (amount, consistency, odor) mod amt tan drainage, no odor Periwound: Intact skin surrounding Dressing procedure/placement/frequency: Pt is frequently incontinent of stool and it is difficult to keep wound from becoming soiled, since it is located near the rectum. Aquacel to absorb drainage and provide antimicrobial benefits, foam dressing to attempt to protect from soiling. Pt states he plans to discharge back to his previous facility possibly later today. Please re-consult if further assistance is needed.  Thank-you,  Julien Girt MSN, [place], [place], [place], [place]

## 2015-09-28 NOTE — Clinical Social Work Note (Signed)
Clinical Social Worker facilitated patient discharge including contacting patient family and facility to confirm patient discharge plans.  Clinical information faxed to facility and family agreeable with plan.  CSW arranged ambulance transport via [facility] to [facility].  RN to call report prior to discharge.  Clinical Social Worker will sign off for now as social work intervention is no longer needed. Please consult us again if new need arises.  Jesse Durrel Mcnee, LCSW [phone] 

## 2015-09-28 NOTE — Clinical Social Work Note (Signed)
Clinical Social Work Assessment  Patient Details  Name: Frank Hoover MRN: [id number] Date of Birth: 07/24/1950  Date of referral:  09/28/15               Reason for consult:  Facility Placement                Permission sought to share information with:  Family Supports, Customer service manager Permission granted to share information::  Yes, Verbal Permission Granted  Name::     Vira Agar  Agency::  [place]  Relationship::  Spouse  Contact Information:  [phone]  Housing/Transportation Living arrangements for the past 2 months:  [place] of Information:  Patient, Spouse Patient Interpreter Needed:  None Criminal Activity/Legal Involvement Pertinent to Current Situation/Hospitalization:  No - Comment as needed Significant Relationships:  Spouse Lives with:  Facility Resident, Spouse Do you feel safe going back to the place where you live?  Yes Need for family participation in patient care:  Yes (Comment)  Care giving concerns:  No family at bedside.  Patient provides verbal permission to contact his wife regarding discharge plans.   Social Worker assessment / plan:  Holiday representative met with patient at bedside to offer support and discuss patient needs at discharge.  Patient states that he is from [facility] and plans to return at discharge.  CSW confirmed with facility regarding patient return and will follow up with patient family per patient request regarding return potential for today.  Employment status:  Disabled (Comment on whether or not currently receiving Disability) Insurance information:  Managed Medicare PT Recommendations:  [place] / Referral to community resources:  [place]  Patient/Family's Response to care:  Patient very appropriate and agreeable with return to [facility].  Patient states that he would prefer to return home but understands barriers and limitations  for return home.  Patient/Family's Understanding of and Emotional Response to Diagnosis, Current Treatment, and Prognosis:  Patient with very limited understanding of current medical status and reasons for continued hospitalizations.  Patient does understand the need for SNF placement and agreeable to return.  Emotional Assessment Appearance:  Appears stated age Attitude/Demeanor/Rapport:  Inconsistent Affect (typically observed):  Calm, Appropriate, Happy Orientation:  Oriented to Self, Oriented to Place, Oriented to  Time Alcohol / Substance use:  Not Applicable Psych involvement (Current and /or in the community):  No (Comment)  Discharge Needs  Concerns to be addressed:  Discharge Planning Concerns Readmission within the last 30 days:  No Current discharge risk:  None Barriers to Discharge:  Continued Medical Work up  [employer], [place]

## 2015-09-28 NOTE — Progress Notes (Signed)
Report called to Lonn Georgia, Therapist, sports at [facility].  All questions answered.

## 2015-09-28 NOTE — Plan of Care (Signed)
Problem: Cardiac: Goal: Ability to achieve and maintain adequate cardiopulmonary perfusion will improve Outcome: Completed/Met Date Met: 09/28/15 Patient has maintained NSR after ablation on 7/26. All other vital signs stable. Patient is taking Eliquis for anticoagulation which was a prior to admission medication.

## 2015-09-29 ENCOUNTER — Telehealth: Payer: Self-pay

## 2015-09-29 NOTE — Telephone Encounter (Signed)
Prior auth obtained for Eliquis 5 mg through [employer]/ [id number]. Local pharmacy notified.

## 2015-10-17 ENCOUNTER — Other Ambulatory Visit: Payer: Self-pay

## 2015-10-17 ENCOUNTER — Encounter: Payer: Self-pay | Admitting: Nurse Practitioner

## 2015-10-17 ENCOUNTER — Ambulatory Visit (INDEPENDENT_AMBULATORY_CARE_PROVIDER_SITE_OTHER): Payer: Medicare Other | Admitting: Nurse Practitioner

## 2015-10-17 VITALS — BP 100/66 | HR 78 | Temp 98.6°F | Ht 66.0 in | Wt 212.0 lb

## 2015-10-17 DIAGNOSIS — D649 Anemia, unspecified: Secondary | ICD-10-CM | POA: Insufficient documentation

## 2015-10-17 DIAGNOSIS — R112 Nausea with vomiting, unspecified: Secondary | ICD-10-CM | POA: Diagnosis not present

## 2015-10-17 DIAGNOSIS — K5900 Constipation, unspecified: Secondary | ICD-10-CM

## 2015-10-17 MED ORDER — ONDANSETRON HCL 4 MG PO TABS: 4.0000 mg | ORAL_TABLET | Freq: Three times a day (TID) | ORAL | 1 refills | Status: DC | PRN

## 2015-10-17 NOTE — Assessment & Plan Note (Signed)
Patient with a very complicated medical history including atrial flutter status post ablation, EF 30%, on amiodarone, recent hospitalization for urosepsis, cardiogenic shock, intubation with subsequent hypotensive related proctoscopy colitis consistent with ischemia. His proctoscopy colitis resolved and hemoglobin improved significantly with blood transfusion. However, about 1 month later when he went in for his ablation procedure his hemoglobin had dropped another gram. We made multiple attempts at colonoscopy which were unsuccessful due to poor prep despite attempts for extended prep and significant increase in dosing of prep. At this point given his medical history he is too complicated for [place] to further evaluate. We will refer to [facility] for further evaluation of his anemia included likely need for colonoscopy, possible endoscopy, possible Givens capsule. If no GI source for bleeding is found he may just need to follow up with hematology for management of anemia in the setting of anticoagulation. Return here in 6 months for follow-up.

## 2015-10-17 NOTE — Assessment & Plan Note (Signed)
Patient with nausea and vomiting likely multifactorial in nature. Does have significant constipation as noted below. At this point we will treat symptomatically with Zofran 4 mg every 8 hours as needed for nausea. Referral to [facility] as noted below. Return here for follow-up in 6 months.

## 2015-10-17 NOTE — Progress Notes (Signed)
Primary Care Physician:  Alonza Bogus, MD Primary Gastroenterologist:  Dr.   Laurel Dimmer Complaint  Patient presents with  . Diarrhea    abd pain    HPI:   Frank Hoover is a 65 y.o. male who presents for follow-up on hospitalization. The patient was hospitalized on 612 [id number] for urosepsis and Citrobacter bacteremia at which point he developed cardiogenic shock requiring vasopressors and ventilator assistance. He was extubated successfully. After his acute illness, while still in the hospital, he developed heme positive stool, minor rectal bleeding, declining hemoglobin. CT scan noted thickening in the rectosigmoid colon concerning for proctocolitis deemed likely due to ischemic colitis in the setting of shock. Rectal sigmoidoscopy showed likely ischemic colitis in the setting of anticoagulation. His hemoglobin stabilized around 9 after his procedure. He received 2 units of PRBCs with a significant response with his hemoglobin rising 3.5 g. Pathology returned on biopsies from flexible sigmoidoscopy consistent with ischemic colitis. Noted to be improving clinically, anemia resolving, rectal bleeding resolved. GI signed off at that time and recommended he follow up with his primary GI provider.  Last hospitalization CBC completed 09/05/2015 with a hemoglobin of 10.4, he was discharged on 09/07/2015. He was readmitted 09/27/2015 for AFlutter ablation. He was discharged to skilled nursing facility today after his procedure. CBC completed the day of his admission and procedure found hemoglobin of 9.3. He was discharged on Eliquis anticoagulation. Last colonoscopy completed 06/22/2014 which found incomplete colonoscopy due to inadequate prep, he has had a history of inadequate preparations. At that time is determined he would likely be best served by further evaluation at a tertiary referral center.  Today he states he feels pretty good overall. Has abdominal pain located periumbilical and lower  abdomen. Also with nausea and vomiting, mostly postprandial. Denies hematemesis. Has a bowel movement about every 2-3 days with hard stools and straining. Denies hematochezia. Admits some dark stools. Has lost about 50 pounds in the past 1.5 years, although he has had a couple severe illnesses in that time. Appetite is decreased due to postprandial nausea. Has rare GERD symptoms. Admits occasional dizziness and dyspnea. States his heart rate has been regular and doing well. Denies chest pain, dizziness, lightheadedness, syncope, near syncope. Denies any other upper or lower GI symptoms.  Takes Linzess 290 mg tid and Senokot bid and continues with severe refractory constipation.  Past Medical History:  Diagnosis Date  . Adenomatous colon polyp 10/30/09   Serrated adenoma removed during Colonoscopy   . Arthritis    "legs" (09/27/2015)  . Atrial flutter with rapid ventricular response ([place]) 09/27/2015  . CKD (chronic kidney disease) stage 3, GFR 30-59 ml/min   . COPD (chronic obstructive pulmonary disease) ([place])   . Diverticulosis of colon   . Dysrhythmia   . Essential hypertension   . Gastroparesis   . GERD (gastroesophageal reflux disease)   . Gout   . Hyperlipemia   . Hypertension   . OA (osteoarthritis)   . Type 2 diabetes mellitus ([place])     Past Surgical History:  Procedure Laterality Date  . CARDIOVERSION N/A 08/22/2015   Procedure: CARDIOVERSION;  Surgeon: Larey Dresser, MD;  Location: [place];  Service: Cardiovascular;  Laterality: N/A;  . CARDIOVERSION N/A 09/01/2015   Procedure: CARDIOVERSION;  Surgeon: Lelon Perla, MD;  Location: [facility] ENDOSCOPY;  Service: Cardiovascular;  Laterality: N/A;  . CATARACT EXTRACTION W/ INTRAOCULAR LENS  IMPLANT, BILATERAL Bilateral   . COLONOSCOPY  10/30/09   [id number] diverticulum/serrated adenoma from  ICV, next colonoscopy due 10/2012  . COLONOSCOPY N/A 09/18/2012   JYN:[id number] mucosa that was seen appeared normal, however most of it  was not seen due to be very poor prep  . COLONOSCOPY N/A 04/08/2013   HYQ:MVHQION polyp removed/inadequate preparation  . COLONOSCOPY N/A 06/22/2014   Procedure: COLONOSCOPY;  Surgeon: Daneil Dolin, MD;  [facility]O [facility];  Service: Endoscopy;  Laterality: N/A;  115   . ELECTROPHYSIOLOGIC STUDY N/A 09/27/2015   Procedure: SVT Ablation;  Surgeon: Evans Lance, MD;  Location: [facility] CV LAB;  Service: Cardiovascular;  Laterality: N/A;  . ESOPHAGOGASTRODUODENOSCOPY  10/30/09   GEX:[id number]   . FLEXIBLE SIGMOIDOSCOPY N/A 08/25/2015   Procedure: FLEXIBLE SIGMOIDOSCOPY;  Surgeon: Jerene Bears, MD;  Location: [facility] ENDOSCOPY;  Service: Endoscopy;  Laterality: N/A;  . TEE WITHOUT CARDIOVERSION N/A 08/22/2015   Procedure: TRANSESOPHAGEAL ECHOCARDIOGRAM (TEE);  Surgeon: Larey Dresser, MD;  Location: [facility];  Service: Cardiovascular;  Laterality: N/A;  . TEE WITHOUT CARDIOVERSION  09/27/2015   Procedure: Transesophageal Echocardiogram (Tee);  Surgeon: Evans Lance, MD;  Location: [place] CV LAB;  Service: Cardiovascular;;  . TONSILLECTOMY  1950s/1960s    Current Outpatient Prescriptions  Medication Sig Dispense Refill  . acetaminophen (TYLENOL) 500 MG tablet Take 1 tablet (500 mg total) by mouth every 6 (six) hours as needed for moderate pain. 30 tablet 0  . albuterol (PROAIR HFA) 108 (90 BASE) MCG/ACT inhaler Inhale 2 puffs into the lungs every 6 (six) hours as needed for wheezing or shortness of breath.     . allopurinol (ZYLOPRIM) 300 MG tablet Take 300 mg by mouth daily.      . Amino Acids-Protein Hydrolys (FEEDING SUPPLEMENT, PRO-STAT SUGAR FREE 64,) LIQD Take 30 mLs by mouth 3 (three) times daily between meals. 900 mL 0  . amiodarone (PACERONE) 200 MG tablet Take 1 tablet (200 mg total) by mouth daily.    Marland Kitchen apixaban (ELIQUIS) 5 MG TABS tablet Take 1 tablet (5 mg total) by mouth 2 (two) times daily. 60 tablet 1  . atorvastatin (LIPITOR) 20 MG tablet Take 20 mg by mouth daily.      . Cholecalciferol (VITAMIN D PO) Take 1,000 Units by mouth daily.    . colchicine 0.6 MG tablet Take 0.6 mg by mouth daily.    . furosemide (LASIX) 40 MG tablet Take 1 tablet (40 mg total) by mouth 2 (two) times daily. 30 tablet   . gabapentin (NEURONTIN) 400 MG capsule Take 400 mg by mouth 2 (two) times daily. Takes at noon & bedtime    . glimepiride (AMARYL) 2 MG tablet Take 2 mg by mouth daily.    Marland Kitchen HUMALOG KWIKPEN 100 UNIT/ML KiwkPen Inject 0-15 Units into the skin See admin instructions. Per sliding scale    . hydrALAZINE (APRESOLINE) 25 MG tablet Take 3 tablets (75 mg total) by mouth every 8 (eight) hours. (Patient taking differently: Take 75 mg by mouth 3 (three) times daily. 75 mg q 8 hrs)    . HYDROcodone-acetaminophen (NORCO/VICODIN) 5-325 MG tablet Take 1 tablet by mouth every 6 (six) hours as needed for severe pain. (Patient taking differently: Take 1 tablet by mouth every 4 (four) hours as needed for severe pain. ) 10 tablet 0  . insulin aspart (NOVOLOG) 100 UNIT/ML injection Inject 0-15 Units into the skin 3 (three) times daily with meals. 10 mL 11  . insulin glargine (LANTUS) 100 UNIT/ML injection Inject 34 Units into the skin at bedtime.    Marland Kitchen  isosorbide mononitrate (IMDUR) 60 MG 24 hr tablet Take 1 tablet (60 mg total) by mouth daily. 30 tablet 0  . LINZESS 290 MCG CAPS capsule TAKE 1 CAPSULE ONCE DAILY 30 MINUTES BEFORE MEALS (Patient taking differently: take 1 capsule by mouth 3 times a day. give 30 minutes prior to first meal) 90 capsule 3  . loperamide (IMODIUM) 2 MG capsule Take 2 mg by mouth daily as needed for constipation.    . metoprolol (LOPRESSOR) 100 MG tablet Take 100 mg by mouth 2 (two) times daily.     . Multiple Vitamin (MULTIVITAMIN WITH MINERALS) TABS tablet Take 1 tablet by mouth daily.    Marland Kitchen oxybutynin (DITROPAN) 5 MG tablet Take 1 tablet (5 mg total) by mouth 3 (three) times daily. 30 tablet 0  . pantoprazole (PROTONIX) 40 MG tablet Take 1 tablet (40 mg total) by  mouth daily.    Marland Kitchen senna-docusate (SENOKOT-S) 8.6-50 MG tablet Take 1 tablet by mouth 2 (two) times daily.    . vitamin C (ASCORBIC ACID) 500 MG tablet Take 500 mg by mouth 2 (two) times daily.    Marland Kitchen zinc sulfate 220 (50 Zn) MG capsule Take 220 mg by mouth daily. X 60 days started 09/29/15    . zolpidem (AMBIEN) 10 MG tablet Take 10 mg by mouth at bedtime.     Marland Kitchen amLODipine (NORVASC) 5 MG tablet Take 5 mg by mouth daily.    . cloNIDine (CATAPRES) 0.2 MG tablet Take 0.2 mg by mouth daily.    . collagenase (SANTYL) ointment Apply topically daily. (Patient not taking: Reported on 10/17/2015) 15 g 0  . feeding supplement, ENSURE ENLIVE, (ENSURE ENLIVE) LIQD Take 237 mLs by mouth 3 (three) times daily between meals. (Patient not taking: Reported on 10/17/2015) 237 mL 12  . gabapentin (NEURONTIN) 300 MG capsule Take 300 mg by mouth daily.    Marland Kitchen lanolin ointment Apply 1 application topically 2 (two) times daily as needed for dry skin.    Marland Kitchen omeprazole (PRILOSEC) 20 MG capsule Take 20 mg by mouth daily.    Nelva Nay SOLOSTAR 300 UNIT/ML SOPN 15 Units every morning. Take 15- 25 units daily as needed     No current facility-administered medications for this visit.     Allergies as of 10/17/2015  . (No Known Allergies)    Family History  Problem Relation Age of Onset  . COPD Father   . Diabetes Mother   . Hypertension Mother   . Colon cancer Neg Hx     Social History   Social History  . Marital status: Married    Spouse name: N/A  . Number of children: 2  . Years of education: N/A   Occupational History  . retired    Social History Main Topics  . Smoking status: Former Smoker    Packs/day: 1.00    Years: 4.00    Types: Cigarettes    Quit date: 08/26/2010  . Smokeless tobacco: Never Used  . Alcohol use No     Comment: "stopped in 2012 when I quit smoking"  . Drug use: No  . Sexual activity: Yes   Other Topics Concern  . Not on file   Social History Narrative  . No narrative on file      Review of Systems: General: Negative for anorexia, fever, chills. Eyes: Negative for vision changes.  ENT: Negative for hoarseness, difficulty swallowing. CV: Negative for chest pain, angina, palpitations, peripheral edema.  Respiratory: Negative for dyspnea at rest,  cough, sputum, wheezing.  GI: See history of present illness. Endo: Negative for unusual weight change.  Heme: Negative for bruising or bleeding.   Physical Exam: BP 100/66   Pulse 78   Temp 98.6 F (37 C) (Oral)   Ht 5' 6"  (1.676 m)   Wt 212 lb (96.2 kg)   BMI 34.22 kg/m  General:   Alert and oriented. Pleasant and cooperative. Well-nourished and well-developed. Sitting in wheelchair. Head:  Normocephalic and atraumatic. Eyes:  Without icterus, sclera clear and conjunctiva pink.  Ears:  Normal auditory acuity. Cardiovascular:  S1, S2 present without murmurs appreciated. Extremities without clubbing or edema. Respiratory:  Clear to auscultation bilaterally. No wheezes, rales, or rhonchi. No distress.  Gastrointestinal:  +BS, soft, non-tender and non-distended. No HSM noted. No guarding or rebound. No masses appreciated.  Rectal:  Deferred  Musculoskalatal:  Symmetrical without gross deformities. Neurologic:  Alert and oriented x4;  grossly normal neurologically. Psych:  Alert and cooperative. Normal mood and affect. Heme/Lymph/Immune: No excessive bruising noted.    10/17/2015 11:44 AM   Disclaimer: This note was dictated with voice recognition software. Similar sounding words can inadvertently be transcribed and may not be corrected upon review.

## 2015-10-17 NOTE — Progress Notes (Signed)
cc'ed to pcp °

## 2015-10-17 NOTE — Assessment & Plan Note (Signed)
He has significant refractory constipation. Is on significant doses of Linzess and Senokot continues with constipation. Possible element of opioid-induced constipation overlaying chronic idiopathic constipation. This is likely in part responsible for why he is been unable to achieve an acceptable colon prep. We will refer him to [facility] as noted below. Return here in 6 months for follow-up.

## 2015-10-17 NOTE — Patient Instructions (Signed)
1. Have your blood work drawn when you're able to. 2. I sent Zofran 4 mg tabs to your pharmacy that she can take every 8 hours as needed for nausea. 3. We will refer you to [facility] for follow-up. 4. Return to our office in 6 months.

## 2015-10-18 ENCOUNTER — Encounter: Payer: Self-pay | Admitting: Cardiology

## 2015-10-18 ENCOUNTER — Other Ambulatory Visit (HOSPITAL_COMMUNITY)
Admission: RE | Admit: 2015-10-18 | Discharge: 2015-10-18 | Disposition: A | Discharge status: Home / Self Care | Payer: Medicare Other | Source: Ambulatory Visit | Attending: Cardiology | Admitting: Cardiology

## 2015-10-18 ENCOUNTER — Ambulatory Visit: Payer: Medicare Other | Admitting: Cardiovascular Disease

## 2015-10-18 ENCOUNTER — Ambulatory Visit (INDEPENDENT_AMBULATORY_CARE_PROVIDER_SITE_OTHER): Payer: Medicare Other | Admitting: Cardiology

## 2015-10-18 ENCOUNTER — Telehealth: Payer: Self-pay

## 2015-10-18 VITALS — BP 116/64 | HR 73 | Ht 66.0 in | Wt 209.0 lb

## 2015-10-18 DIAGNOSIS — I10 Essential (primary) hypertension: Secondary | ICD-10-CM

## 2015-10-18 DIAGNOSIS — I4892 Unspecified atrial flutter: Secondary | ICD-10-CM

## 2015-10-18 DIAGNOSIS — I5022 Chronic systolic (congestive) heart failure: Secondary | ICD-10-CM | POA: Diagnosis not present

## 2015-10-18 DIAGNOSIS — Z5181 Encounter for therapeutic drug level monitoring: Secondary | ICD-10-CM | POA: Insufficient documentation

## 2015-10-18 DIAGNOSIS — Z79899 Other long term (current) drug therapy: Secondary | ICD-10-CM | POA: Insufficient documentation

## 2015-10-18 LAB — CBC WITH DIFFERENTIAL/PLATELET
Basophils Absolute: 0 10*3/uL (ref 0.0–0.1)
Basophils Relative: 1 %
Eosinophils Absolute: 0.1 10*3/uL (ref 0.0–0.7)
Eosinophils Relative: 1 %
HCT: 33.3 % — ABNORMAL LOW (ref 39.0–52.0)
Hemoglobin: 10.8 g/dL — ABNORMAL LOW (ref 13.0–17.0)
Lymphocytes Relative: 36 %
Lymphs Abs: 1.7 10*3/uL (ref 0.7–4.0)
MCH: 29.6 pg (ref 26.0–34.0)
MCHC: 32.4 g/dL (ref 30.0–36.0)
MCV: 91.2 fL (ref 78.0–100.0)
Monocytes Absolute: 0.5 10*3/uL (ref 0.1–1.0)
Monocytes Relative: 9 %
Neutro Abs: 2.6 10*3/uL (ref 1.7–7.7)
Neutrophils Relative %: 53 %
Platelets: 330 10*3/uL (ref 150–400)
RBC: 3.65 MIL/uL — ABNORMAL LOW (ref 4.22–5.81)
RDW: 18.7 % — ABNORMAL HIGH (ref 11.5–15.5)
WBC: 4.9 10*3/uL (ref 4.0–10.5)

## 2015-10-18 LAB — BASIC METABOLIC PANEL
Anion gap: 9 (ref 5–15)
BUN: 48 mg/dL — ABNORMAL HIGH (ref 6–20)
CO2: 24 mmol/L (ref 22–32)
Calcium: 8.4 mg/dL — ABNORMAL LOW (ref 8.9–10.3)
Chloride: 100 mmol/L — ABNORMAL LOW (ref 101–111)
Creatinine, Ser: 2.27 mg/dL — ABNORMAL HIGH (ref 0.61–1.24)
GFR calc Af Amer: 34 mL/min — ABNORMAL LOW (ref >60)
GFR calc non Af Amer: 29 mL/min — ABNORMAL LOW (ref >60)
Glucose, Bld: 114 mg/dL — ABNORMAL HIGH (ref 65–99)
Potassium: 3 mmol/L — ABNORMAL LOW (ref 3.5–5.1)
Sodium: 133 mmol/L — ABNORMAL LOW (ref 135–145)

## 2015-10-18 LAB — MAGNESIUM: Magnesium: 1.7 mg/dL (ref 1.7–2.4)

## 2015-10-18 MED ORDER — CLONIDINE HCL 0.1 MG PO TABS: 0.1000 mg | ORAL_TABLET | Freq: Two times a day (BID) | ORAL | 11 refills | Status: DC

## 2015-10-18 MED ORDER — METOPROLOL SUCCINATE ER 100 MG PO TB24: 100.0000 mg | ORAL_TABLET | Freq: Two times a day (BID) | ORAL | 3 refills | Status: DC

## 2015-10-18 NOTE — Telephone Encounter (Signed)
Spoke to [place] @ [facility] . Let her know lab results, and I will fax orders to her to repeat labs in 1 month.

## 2015-10-18 NOTE — Patient Instructions (Signed)
Medication Instructions:  STOP LOPRESSOR  START METOPROLOL XL 100 MG - TWO TIMES DAILY  DECREASE CLONIDINE TO 0.1 MG DAILY   Labwork: Your physician recommends that you return for lab work in: TODAY CBC BMET MAGNESIUM   Testing/Procedures: NONE  Follow-Up: Your physician recommends that you schedule a follow-up appointment in: 3-4 WEEKS    Any Other Special Instructions Will Be Listed Below (If Applicable).     If you need a refill on your cardiac medications before your next appointment, please call your pharmacy.

## 2015-10-18 NOTE — Telephone Encounter (Signed)
-----   Message from Arnoldo Lenis, MD sent at 10/18/2015  1:20 PM EDT ----- Labs show his anemia is improving. Renal function is decreased by overall stable. His potassium is low, he should take KCl 21mEq bid for 3 days, then take 27mEq daily. Repeat BMET in 1 month  J BrancH MD

## 2015-10-18 NOTE — Progress Notes (Addendum)
Clinical Summary Frank Hoover is a 65 y.o.male last seen by NP Purcell Nails, this is our first visit together. He is seen for the following medical problems.  1. 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  - no recent palpitations - followed by GI, no visualized recurrent bleeding however Hgb has continued to be low.   2. Chronic systolic HF - echo 05/2917 LVEF 25%, diffuse hypokinesis. Mod RV dysfunction.  - new diagnosis 08/2015 during admission with sepsis - thought possible tachcyardia mediated, however he has not had an ischemic evaluation  - occasional SOB at times. No recent LE edema - compliant with meds. No ACE/entresto/aldactone due to CKD>       SH: currently at [facility] center Past Medical History:  Diagnosis Date  . Adenomatous colon polyp 10/30/09   Serrated adenoma removed during Colonoscopy   . Arthritis    "legs" (09/27/2015)  . Atrial flutter with rapid ventricular response ([place]) 09/27/2015  . CKD (chronic kidney disease) stage 3, GFR 30-59 ml/min   . COPD (chronic obstructive pulmonary disease) ([place])   . Diverticulosis of colon   . Dysrhythmia   . Essential hypertension   . Gastroparesis   . GERD (gastroesophageal reflux disease)   . Gout   . Hyperlipemia   . Hypertension   . OA (osteoarthritis)   . Type 2 diabetes mellitus (HCC)      No Known Allergies   Current Outpatient Prescriptions  Medication Sig Dispense Refill  . acetaminophen (TYLENOL) 500 MG tablet Take 1 tablet (500 mg total) by mouth every 6 (six) hours as needed for moderate pain. 30 tablet 0  . albuterol (PROAIR HFA) 108 (90 BASE) MCG/ACT inhaler Inhale 2 puffs into the lungs every 6 (six) hours as needed for wheezing or shortness of breath.     . allopurinol (ZYLOPRIM) 300 MG tablet Take 300 mg by mouth daily.      . Amino  Acids-Protein Hydrolys (FEEDING SUPPLEMENT, PRO-STAT SUGAR FREE 64,) LIQD Take 30 mLs by mouth 3 (three) times daily between meals. 900 mL 0  . amiodarone (PACERONE) 200 MG tablet Take 1 tablet (200 mg total) by mouth daily.    Marland Kitchen amLODipine (NORVASC) 5 MG tablet Take 5 mg by mouth daily.    Marland Kitchen apixaban (ELIQUIS) 5 MG TABS tablet Take 1 tablet (5 mg total) by mouth 2 (two) times daily. 60 tablet 1  . atorvastatin (LIPITOR) 20 MG tablet Take 20 mg by mouth daily.    . Cholecalciferol (VITAMIN D PO) Take 1,000 Units by mouth daily.    . cloNIDine (CATAPRES) 0.2 MG tablet Take 0.2 mg by mouth daily.    . colchicine 0.6 MG tablet Take 0.6 mg by mouth daily.    . collagenase (SANTYL) ointment Apply topically daily. (Patient not taking: Reported on 10/17/2015) 15 g 0  . feeding supplement, ENSURE ENLIVE, (ENSURE ENLIVE) LIQD Take 237 mLs by mouth 3 (three) times daily between meals. (Patient not taking: Reported on 10/17/2015) 237 mL 12  . furosemide (LASIX) 40 MG tablet Take 1 tablet (40 mg total) by mouth 2 (two) times daily. 30 tablet   . gabapentin (NEURONTIN) 300 MG capsule Take 300 mg by mouth daily.    Marland Kitchen gabapentin (NEURONTIN) 400 MG capsule Take 400 mg by mouth 2 (two) times daily. Takes at noon & bedtime    .  glimepiride (AMARYL) 2 MG tablet Take 2 mg by mouth daily.    Marland Kitchen HUMALOG KWIKPEN 100 UNIT/ML KiwkPen Inject 0-15 Units into the skin See admin instructions. Per sliding scale    . hydrALAZINE (APRESOLINE) 25 MG tablet Take 3 tablets (75 mg total) by mouth every 8 (eight) hours. (Patient taking differently: Take 75 mg by mouth 3 (three) times daily. 75 mg q 8 hrs)    . HYDROcodone-acetaminophen (NORCO/VICODIN) 5-325 MG tablet Take 1 tablet by mouth every 6 (six) hours as needed for severe pain. (Patient taking differently: Take 1 tablet by mouth every 4 (four) hours as needed for severe pain. ) 10 tablet 0  . insulin aspart (NOVOLOG) 100 UNIT/ML injection Inject 0-15 Units into the skin 3 (three)  times daily with meals. 10 mL 11  . insulin glargine (LANTUS) 100 UNIT/ML injection Inject 34 Units into the skin at bedtime.    . isosorbide mononitrate (IMDUR) 60 MG 24 hr tablet Take 1 tablet (60 mg total) by mouth daily. 30 tablet 0  . lanolin ointment Apply 1 application topically 2 (two) times daily as needed for dry skin.    Marland Kitchen LINZESS 290 MCG CAPS capsule TAKE 1 CAPSULE ONCE DAILY 30 MINUTES BEFORE MEALS (Patient taking differently: take 1 capsule by mouth 3 times a day. give 30 minutes prior to first meal) 90 capsule 3  . loperamide (IMODIUM) 2 MG capsule Take 2 mg by mouth daily as needed for constipation.    . metoprolol (LOPRESSOR) 100 MG tablet Take 100 mg by mouth 2 (two) times daily.     . Multiple Vitamin (MULTIVITAMIN WITH MINERALS) TABS tablet Take 1 tablet by mouth daily.    Marland Kitchen omeprazole (PRILOSEC) 20 MG capsule Take 20 mg by mouth daily.    . ondansetron (ZOFRAN) 4 MG tablet Take 1 tablet (4 mg total) by mouth every 8 (eight) hours as needed for nausea or vomiting. 30 tablet 1  . oxybutynin (DITROPAN) 5 MG tablet Take 1 tablet (5 mg total) by mouth 3 (three) times daily. 30 tablet 0  . pantoprazole (PROTONIX) 40 MG tablet Take 1 tablet (40 mg total) by mouth daily.    Marland Kitchen senna-docusate (SENOKOT-S) 8.6-50 MG tablet Take 1 tablet by mouth 2 (two) times daily.    Nelva Nay SOLOSTAR 300 UNIT/ML SOPN 15 Units every morning. Take 15- 25 units daily as needed    . vitamin C (ASCORBIC ACID) 500 MG tablet Take 500 mg by mouth 2 (two) times daily.    Marland Kitchen zinc sulfate 220 (50 Zn) MG capsule Take 220 mg by mouth daily. X 60 days started 09/29/15    . zolpidem (AMBIEN) 10 MG tablet Take 10 mg by mouth at bedtime.      No current facility-administered medications for this visit.      Past Surgical History:  Procedure Laterality Date  . CARDIOVERSION N/A 08/22/2015   Procedure: CARDIOVERSION;  Surgeon: Larey Dresser, MD;  Location: [place];  Service: Cardiovascular;  Laterality: N/A;    . CARDIOVERSION N/A 09/01/2015   Procedure: CARDIOVERSION;  Surgeon: Lelon Perla, MD;  Location: [place];  Service: Cardiovascular;  Laterality: N/A;  . CATARACT EXTRACTION W/ INTRAOCULAR LENS  IMPLANT, BILATERAL Bilateral   . COLONOSCOPY  10/30/09   [id number] diverticulum/serrated adenoma from ICV, next colonoscopy due 10/2012  . COLONOSCOPY N/A 09/18/2012   [id number] mucosa that was seen appeared normal, however most of it was not seen due to be very poor prep  .  COLONOSCOPY N/A 04/08/2013   [id number] polyp removed/inadequate preparation  . COLONOSCOPY N/A 06/22/2014   Procedure: COLONOSCOPY;  Surgeon: Daneil Dolin, MD;  [facility];  Service: Endoscopy;  Laterality: N/A;  115   . ELECTROPHYSIOLOGIC STUDY N/A 09/27/2015   Procedure: SVT Ablation;  Surgeon: Evans Lance, MD;  Location: [place] [facility];  Service: Cardiovascular;  Laterality: N/A;  . ESOPHAGOGASTRODUODENOSCOPY  10/30/09   [id number]:[id number]   . FLEXIBLE SIGMOIDOSCOPY N/A 08/25/2015   Procedure: FLEXIBLE SIGMOIDOSCOPY;  Surgeon: Jerene Bears, MD;  Location: [facility] ENDOSCOPY;  Service: Endoscopy;  Laterality: N/A;  . TEE WITHOUT CARDIOVERSION N/A 08/22/2015   Procedure: TRANSESOPHAGEAL ECHOCARDIOGRAM (TEE);  Surgeon: Larey Dresser, MD;  Location: [place];  Service: Cardiovascular;  Laterality: N/A;  . TEE WITHOUT CARDIOVERSION  09/27/2015   Procedure: Transesophageal Echocardiogram (Tee);  Surgeon: Evans Lance, MD;  Location: [place] [facility];  Service: Cardiovascular;;  . TONSILLECTOMY  1950s/1960s     No Known Allergies    Family History  Problem Relation Age of Onset  . COPD Father   . Diabetes Mother   . Hypertension Mother   . Colon cancer Neg Hx      Social History Mr. Neece reports that he quit smoking about 5 years ago. His smoking use included Cigarettes. He has a 4.00 pack-year smoking history. He has never used smokeless tobacco. Mr. Skog reports that he does not drink  alcohol.   Review of Systems CONSTITUTIONAL: No weight loss, fever, chills, weakness or fatigue.  HEENT: Eyes: No visual loss, blurred vision, double vision or yellow sclerae.No hearing loss, sneezing, congestion, runny nose or sore throat.  SKIN: No rash or itching.  CARDIOVASCULAR: per HPI RESPIRATORY: No shortness of breath, cough or sputum.  GASTROINTESTINAL: No anorexia, nausea, vomiting or diarrhea. No abdominal pain or blood.  GENITOURINARY: No burning on urination, no polyuria NEUROLOGICAL: No headache, dizziness, syncope, paralysis, ataxia, numbness or tingling in the extremities. No change in bowel or bladder control.  MUSCULOSKELETAL: No muscle, back pain, joint pain or stiffness.  LYMPHATICS: No enlarged nodes. No history of splenectomy.  PSYCHIATRIC: No history of depression or anxiety.  ENDOCRINOLOGIC: No reports of sweating, cold or heat intolerance. No polyuria or polydipsia.  Marland Kitchen   Physical Examination Vitals:   10/18/15 0902  BP: 116/64  Pulse: 73   Vitals:   10/18/15 0902  Weight: 209 lb (94.8 kg)  Height: 5' 6"  (1.676 m)    Gen: resting comfortably, no acute distress HEENT: no scleral icterus, pupils equal round and reactive, no palptable cervical adenopathy,  CV: RRR, no m/r/g, no jvd Resp: Clear to auscultation bilaterally GI: abdomen is soft, non-tender, non-distended, normal bowel sounds, no hepatosplenomegaly MSK: extremities are warm, no edema.  Skin: warm, no rash Neuro:  no focal deficits Psych: appropriate affect     Assessment and Plan  1. Aflutter  - s/p ablation, followed by EP. Will defer to them decision on continued amiodarone and anticoagulation. - no current symptoms. Ekg in clinic shows he is maintaing NSR.   2. Chronic systolic HF - we will change his lopressor to Toprol XL 142m bid - appears euvoelmic, continue other meds. 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 Invasive testing limited based on poor renal function at this time, follow function with arrhythmia control  3. HTN - wean clonidine to make room for CHF related meds, decrease to 0.165mdaily and  then will stop at next follow up.     F/u 3-4 weeks. Repeat labs.   Arnoldo Lenis, M.D.

## 2015-10-26 ENCOUNTER — Ambulatory Visit (INDEPENDENT_AMBULATORY_CARE_PROVIDER_SITE_OTHER): Payer: Medicare Other | Admitting: Internal Medicine

## 2015-10-26 ENCOUNTER — Encounter: Payer: Self-pay | Admitting: Internal Medicine

## 2015-10-26 VITALS — BP 106/56 | HR 64 | Ht 66.0 in | Wt 205.0 lb

## 2015-10-26 DIAGNOSIS — Z9889 Other specified postprocedural states: Secondary | ICD-10-CM | POA: Diagnosis not present

## 2015-10-26 DIAGNOSIS — Z8679 Personal history of other diseases of the circulatory system: Secondary | ICD-10-CM

## 2015-10-26 NOTE — Patient Instructions (Signed)
Your physician wants you to follow-up in: July with Dr.Taylor You will receive a reminder letter in the mail two months in advance. If you don't receive a letter, please call our office to schedule the follow-up appointment.  Your physician has recommended you make the following change in your medication:  Stop Taking Eliquis  Stop Taking Amiodarone   If you need a refill on your cardiac medications before your next appointment, please call your pharmacy.  Thank you for choosing [facility]!

## 2015-10-26 NOTE — Progress Notes (Signed)
HPI [name] returns today for followup. He is a pleasant 65 yo man with multiple medical problems who has had both atrial flutter and SVT (AVNRT). He underwent EP study and catheter ablation of both arrhythmias several weeks ago and returns today for followup. In the interim, he has done well. His diarrhea is much improved. No palpitations of SVT.  No Known Allergies   Current Outpatient Prescriptions  Medication Sig Dispense Refill  . acetaminophen (TYLENOL) 500 MG tablet Take 1 tablet (500 mg total) by mouth every 6 (six) hours as needed for moderate pain. 30 tablet 0  . albuterol (PROAIR HFA) 108 (90 BASE) MCG/ACT inhaler Inhale 2 puffs into the lungs every 6 (six) hours as needed for wheezing or shortness of breath.     . allopurinol (ZYLOPRIM) 300 MG tablet Take 300 mg by mouth daily.      . Amino Acids-Protein Hydrolys (FEEDING SUPPLEMENT, PRO-STAT SUGAR FREE 64,) LIQD Take 30 mLs by mouth 3 (three) times daily between meals. 900 mL 0  . amLODipine (NORVASC) 5 MG tablet Take 5 mg by mouth daily.    Marland Kitchen atorvastatin (LIPITOR) 20 MG tablet Take 20 mg by mouth daily.    . Cholecalciferol (VITAMIN D PO) Take 1,000 Units by mouth daily.    . cloNIDine (CATAPRES) 0.1 MG tablet Take 1 tablet (0.1 mg total) by mouth 2 (two) times daily. 60 tablet 11  . colchicine 0.6 MG tablet Take 0.6 mg by mouth daily.    . collagenase (SANTYL) ointment Apply topically daily. 15 g 0  . feeding supplement, ENSURE ENLIVE, (ENSURE ENLIVE) LIQD Take 237 mLs by mouth 3 (three) times daily between meals. 237 mL 12  . furosemide (LASIX) 40 MG tablet Take 1 tablet (40 mg total) by mouth 2 (two) times daily. 30 tablet   . gabapentin (NEURONTIN) 300 MG capsule Take 300 mg by mouth daily.    Marland Kitchen gabapentin (NEURONTIN) 400 MG capsule Take 400 mg by mouth 2 (two) times daily. Takes at noon & bedtime    . glimepiride (AMARYL) 2 MG tablet Take 2 mg by mouth daily.    Marland Kitchen HUMALOG KWIKPEN 100 UNIT/ML KiwkPen Inject 0-15  Units into the skin See admin instructions. Per sliding scale    . hydrALAZINE (APRESOLINE) 25 MG tablet Take 3 tablets (75 mg total) by mouth every 8 (eight) hours. (Patient taking differently: Take 75 mg by mouth 3 (three) times daily. 75 mg q 8 hrs)    . HYDROcodone-acetaminophen (NORCO/VICODIN) 5-325 MG tablet Take 1 tablet by mouth every 6 (six) hours as needed for severe pain. (Patient taking differently: Take 1 tablet by mouth every 4 (four) hours as needed for severe pain. ) 10 tablet 0  . insulin aspart (NOVOLOG) 100 UNIT/ML injection Inject 0-15 Units into the skin 3 (three) times daily with meals. 10 mL 11  . insulin glargine (LANTUS) 100 UNIT/ML injection Inject 34 Units into the skin at bedtime.    . isosorbide mononitrate (IMDUR) 60 MG 24 hr tablet Take 1 tablet (60 mg total) by mouth daily. 30 tablet 0  . lanolin ointment Apply 1 application topically 2 (two) times daily as needed for dry skin.    Marland Kitchen LINZESS 290 MCG CAPS capsule TAKE 1 CAPSULE ONCE DAILY 30 MINUTES BEFORE MEALS (Patient taking differently: take 1 capsule by mouth 3 times a day. give 30 minutes prior to first meal) 90 capsule 3  . loperamide (IMODIUM) 2 MG capsule Take 2  mg by mouth daily as needed for constipation.    . metoprolol succinate (TOPROL-XL) 100 MG 24 hr tablet Take 1 tablet (100 mg total) by mouth 2 (two) times daily. Take with or immediately following a meal. 60 tablet 3  . Multiple Vitamin (MULTIVITAMIN WITH MINERALS) TABS tablet Take 1 tablet by mouth daily.    Marland Kitchen omeprazole (PRILOSEC) 20 MG capsule Take 20 mg by mouth daily.    . ondansetron (ZOFRAN) 4 MG tablet Take 1 tablet (4 mg total) by mouth every 8 (eight) hours as needed for nausea or vomiting. 30 tablet 1  . oxybutynin (DITROPAN) 5 MG tablet Take 1 tablet (5 mg total) by mouth 3 (three) times daily. 30 tablet 0  . pantoprazole (PROTONIX) 40 MG tablet Take 1 tablet (40 mg total) by mouth daily.    Marland Kitchen senna-docusate (SENOKOT-S) 8.6-50 MG tablet Take  1 tablet by mouth 2 (two) times daily.    Nelva Nay SOLOSTAR 300 UNIT/ML SOPN 15 Units every morning. Take 15- 25 units daily as needed    . vitamin C (ASCORBIC ACID) 500 MG tablet Take 500 mg by mouth 2 (two) times daily.    Marland Kitchen zinc sulfate 220 (50 Zn) MG capsule Take 220 mg by mouth daily. X 60 days started 09/29/15    . zolpidem (AMBIEN) 10 MG tablet Take 10 mg by mouth at bedtime.      No current facility-administered medications for this visit.      Past Medical History:  Diagnosis Date  . Adenomatous colon polyp 10/30/09   Serrated adenoma removed during Colonoscopy   . Arthritis    "legs" (09/27/2015)  . Atrial flutter with rapid ventricular response ([place]) 09/27/2015  . CKD (chronic kidney disease) stage 3, GFR 30-59 ml/min   . COPD (chronic obstructive pulmonary disease) ([place])   . Diverticulosis of colon   . Dysrhythmia   . Essential hypertension   . Gastroparesis   . GERD (gastroesophageal reflux disease)   . Gout   . Hyperlipemia   . Hypertension   . OA (osteoarthritis)   . Type 2 diabetes mellitus (HCC)     ROS:   All systems reviewed and negative except as noted in the HPI.   Past Surgical History:  Procedure Laterality Date  . CARDIOVERSION N/A 08/22/2015   Procedure: CARDIOVERSION;  Surgeon: Larey Dresser, MD;  Location: [place];  Service: Cardiovascular;  Laterality: N/A;  . CARDIOVERSION N/A 09/01/2015   Procedure: CARDIOVERSION;  Surgeon: Lelon Perla, MD;  Location: [place];  Service: Cardiovascular;  Laterality: N/A;  . CATARACT EXTRACTION W/ INTRAOCULAR LENS  IMPLANT, BILATERAL Bilateral   . COLONOSCOPY  10/30/09   [id number] diverticulum/serrated adenoma from ICV, next colonoscopy due 10/2012  . COLONOSCOPY N/A 09/18/2012   [id number]:[id number] mucosa that was seen appeared normal, however most of it was not seen due to be very poor prep  . COLONOSCOPY N/A 04/08/2013   UXL:[id number] polyp removed/inadequate preparation  . COLONOSCOPY N/A 06/22/2014    Procedure: COLONOSCOPY;  Surgeon: Daneil Dolin, MD;  [facility];  Service: Endoscopy;  Laterality: N/A;  115   . ELECTROPHYSIOLOGIC STUDY N/A 09/27/2015   Procedure: SVT Ablation;  Surgeon: Evans Lance, MD;  Location: [place] [facility];  Service: Cardiovascular;  Laterality: N/A;  . ESOPHAGOGASTRODUODENOSCOPY  10/30/09   [id number]:[id number]   . FLEXIBLE SIGMOIDOSCOPY N/A 08/25/2015   Procedure: FLEXIBLE SIGMOIDOSCOPY;  Surgeon: Jerene Bears, MD;  Location: [facility] ENDOSCOPY;  Service: Endoscopy;  Laterality:  N/A;  . TEE WITHOUT CARDIOVERSION N/A 08/22/2015   Procedure: TRANSESOPHAGEAL ECHOCARDIOGRAM (TEE);  Surgeon: Larey Dresser, MD;  Location: [place];  Service: Cardiovascular;  Laterality: N/A;  . TEE WITHOUT CARDIOVERSION  09/27/2015   Procedure: Transesophageal Echocardiogram (Tee);  Surgeon: Evans Lance, MD;  Location: [place] [facility];  Service: Cardiovascular;;  . TONSILLECTOMY  1950s/1960s     Family History  Problem Relation Age of Onset  . COPD Father   . Diabetes Mother   . Hypertension Mother   . Colon cancer Neg Hx      Social History   Social History  . Marital status: Married    Spouse name: N/A  . Number of children: 2  . Years of education: N/A   Occupational History  . retired    Social History Main Topics  . Smoking status: Former Smoker    Packs/day: 1.00    Years: 4.00    Types: Cigarettes    Quit date: 08/26/2010  . Smokeless tobacco: Never Used  . Alcohol use No     Comment: "stopped in 2012 when I quit smoking"  . Drug use: No  . Sexual activity: Not Currently   Other Topics Concern  . Not on file   Social History Narrative  . No narrative on file     BP (!) 106/56   Pulse 64   Ht 5' 6"  (1.676 m)   Wt 205 lb (93 kg)   SpO2 99%   BMI 33.09 kg/m   Physical Exam:  Well appearing 65 yo man, NAD HEENT: Unremarkable Neck:  6 cm JVD, no thyromegally Lymphatics:  No adenopathy Back:  No CVA tenderness Lungs:  Clear  with no wheezes HEART:  Regular rate rhythm, no murmurs, no rubs, no clicks Abd:  soft, positive bowel sounds, no organomegally, no rebound, no guarding Ext:  2 plus pulses, no edema, no cyanosis, no clubbing Skin:  No rashes no nodules Neuro:  CN II through XII intact, motor grossly intact  EKG - nsr with occaisional PVC's  Assess/Plan: 1. 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 He will stop his Eliquis and Amiodarone. I will see him back in a year 2. HTN - his blood pressure is well controlled.  3. Diarrhea - this was a problem several months ago. He notes that he is down to 1-2 BM's a day.  Mikle Bosworth.D.

## 2015-10-31 DIAGNOSIS — R3914 Feeling of incomplete bladder emptying: Secondary | ICD-10-CM | POA: Diagnosis not present

## 2015-11-01 DIAGNOSIS — I5021 Acute systolic (congestive) heart failure: Secondary | ICD-10-CM | POA: Diagnosis not present

## 2015-11-01 DIAGNOSIS — M6281 Muscle weakness (generalized): Secondary | ICD-10-CM | POA: Diagnosis not present

## 2015-11-01 DIAGNOSIS — L89159 Pressure ulcer of sacral region, unspecified stage: Secondary | ICD-10-CM | POA: Diagnosis not present

## 2015-11-01 DIAGNOSIS — I2510 Atherosclerotic heart disease of native coronary artery without angina pectoris: Secondary | ICD-10-CM | POA: Diagnosis not present

## 2015-11-01 DIAGNOSIS — J449 Chronic obstructive pulmonary disease, unspecified: Secondary | ICD-10-CM | POA: Diagnosis not present

## 2015-11-01 DIAGNOSIS — R2681 Unsteadiness on feet: Secondary | ICD-10-CM | POA: Diagnosis not present

## 2015-11-03 DIAGNOSIS — L89153 Pressure ulcer of sacral region, stage 3: Secondary | ICD-10-CM | POA: Diagnosis not present

## 2015-11-03 DIAGNOSIS — R339 Retention of urine, unspecified: Secondary | ICD-10-CM | POA: Diagnosis not present

## 2015-11-03 DIAGNOSIS — I2510 Atherosclerotic heart disease of native coronary artery without angina pectoris: Secondary | ICD-10-CM | POA: Diagnosis not present

## 2015-11-03 DIAGNOSIS — G894 Chronic pain syndrome: Secondary | ICD-10-CM | POA: Diagnosis not present

## 2015-11-03 DIAGNOSIS — E1140 Type 2 diabetes mellitus with diabetic neuropathy, unspecified: Secondary | ICD-10-CM | POA: Diagnosis not present

## 2015-11-03 DIAGNOSIS — E1122 Type 2 diabetes mellitus with diabetic chronic kidney disease: Secondary | ICD-10-CM | POA: Diagnosis not present

## 2015-11-03 DIAGNOSIS — I130 Hypertensive heart and chronic kidney disease with heart failure and stage 1 through stage 4 chronic kidney disease, or unspecified chronic kidney disease: Secondary | ICD-10-CM | POA: Diagnosis not present

## 2015-11-03 DIAGNOSIS — I5021 Acute systolic (congestive) heart failure: Secondary | ICD-10-CM | POA: Diagnosis not present

## 2015-11-03 DIAGNOSIS — G4733 Obstructive sleep apnea (adult) (pediatric): Secondary | ICD-10-CM | POA: Diagnosis not present

## 2015-11-03 DIAGNOSIS — J449 Chronic obstructive pulmonary disease, unspecified: Secondary | ICD-10-CM | POA: Diagnosis not present

## 2015-11-03 DIAGNOSIS — N184 Chronic kidney disease, stage 4 (severe): Secondary | ICD-10-CM | POA: Diagnosis not present

## 2015-11-03 DIAGNOSIS — D464 Refractory anemia, unspecified: Secondary | ICD-10-CM | POA: Diagnosis not present

## 2015-11-07 DIAGNOSIS — I130 Hypertensive heart and chronic kidney disease with heart failure and stage 1 through stage 4 chronic kidney disease, or unspecified chronic kidney disease: Secondary | ICD-10-CM | POA: Diagnosis not present

## 2015-11-07 DIAGNOSIS — D464 Refractory anemia, unspecified: Secondary | ICD-10-CM | POA: Diagnosis not present

## 2015-11-07 DIAGNOSIS — I2510 Atherosclerotic heart disease of native coronary artery without angina pectoris: Secondary | ICD-10-CM | POA: Diagnosis not present

## 2015-11-07 DIAGNOSIS — I5021 Acute systolic (congestive) heart failure: Secondary | ICD-10-CM | POA: Diagnosis not present

## 2015-11-07 DIAGNOSIS — L89153 Pressure ulcer of sacral region, stage 3: Secondary | ICD-10-CM | POA: Diagnosis not present

## 2015-11-07 DIAGNOSIS — E1122 Type 2 diabetes mellitus with diabetic chronic kidney disease: Secondary | ICD-10-CM | POA: Diagnosis not present

## 2015-11-07 DIAGNOSIS — J449 Chronic obstructive pulmonary disease, unspecified: Secondary | ICD-10-CM | POA: Diagnosis not present

## 2015-11-07 DIAGNOSIS — R339 Retention of urine, unspecified: Secondary | ICD-10-CM | POA: Diagnosis not present

## 2015-11-07 DIAGNOSIS — G894 Chronic pain syndrome: Secondary | ICD-10-CM | POA: Diagnosis not present

## 2015-11-07 DIAGNOSIS — G4733 Obstructive sleep apnea (adult) (pediatric): Secondary | ICD-10-CM | POA: Diagnosis not present

## 2015-11-07 DIAGNOSIS — E1140 Type 2 diabetes mellitus with diabetic neuropathy, unspecified: Secondary | ICD-10-CM | POA: Diagnosis not present

## 2015-11-07 DIAGNOSIS — N184 Chronic kidney disease, stage 4 (severe): Secondary | ICD-10-CM | POA: Diagnosis not present

## 2015-11-08 ENCOUNTER — Encounter: Payer: Self-pay | Admitting: Physician Assistant

## 2015-11-08 ENCOUNTER — Ambulatory Visit: Payer: Medicare Other | Admitting: Physician Assistant

## 2015-11-08 DIAGNOSIS — R338 Other retention of urine: Secondary | ICD-10-CM | POA: Diagnosis not present

## 2015-11-08 NOTE — Progress Notes (Deleted)
Cardiology Office Note    Date:  11/08/2015   ID:  Frank Hoover, DOB 14-Aug-1950, MRN [id number]  PCP:  Alonza Bogus, MD  Cardiologist: Dr. Harl Bowie EPS: Dr. Lovena Le  No chief complaint on file.   History of Present Illness:  Frank Hoover is a 65 y.o. male with both atrial flutter and SVT (AVNRT). He underwent EP study and catheter ablation of both arrhythmias 09/28/15. Saw Dr. Lovena Le 10/26/15 and Eliquis and Amiodarone were stopped. Also has Chronic systolic CHF EF 22% echo 06/8248.Suspected tachycardia related CM however ischemic evaluation not done. Poor renal function so invasive testing limited. Clonindine decreased and lopressor changed to toprol xl 100 mg daily     Past Medical History:  Diagnosis Date  . Adenomatous colon polyp 10/30/09   Serrated adenoma removed during Colonoscopy   . Arthritis    "legs" (09/27/2015)  . Atrial flutter with rapid ventricular response ([place]) 09/27/2015  . CKD (chronic kidney disease) stage 3, GFR 30-59 ml/min   . COPD (chronic obstructive pulmonary disease) ([place])   . Diverticulosis of colon   . Dysrhythmia   . Essential hypertension   . Gastroparesis   . GERD (gastroesophageal reflux disease)   . Gout   . Hyperlipemia   . Hypertension   . OA (osteoarthritis)   . Type 2 diabetes mellitus ([place])     Past Surgical History:  Procedure Laterality Date  . CARDIOVERSION N/A 08/22/2015   Procedure: CARDIOVERSION;  Surgeon: Larey Dresser, MD;  Location: [facility];  Service: Cardiovascular;  Laterality: N/A;  . CARDIOVERSION N/A 09/01/2015   Procedure: CARDIOVERSION;  Surgeon: Lelon Perla, MD;  Location: [facility];  Service: Cardiovascular;  Laterality: N/A;  . CATARACT EXTRACTION W/ INTRAOCULAR LENS  IMPLANT, BILATERAL Bilateral   . COLONOSCOPY  10/30/09   IBB:CWUG-[id number] diverticulum/serrated adenoma from ICV, next colonoscopy due 10/2012  . COLONOSCOPY N/A 09/18/2012   IHW:TUUEKCM mucosa that was seen appeared normal,  however most of it was not seen due to be very poor prep  . COLONOSCOPY N/A 04/08/2013   KLK:JZPHXTA polyp removed/inadequate preparation  . COLONOSCOPY N/A 06/22/2014   Procedure: COLONOSCOPY;  Surgeon: Daneil Dolin, MD;  [facility];  Service: Endoscopy;  Laterality: N/A;  115   . ELECTROPHYSIOLOGIC STUDY N/A 09/27/2015   Procedure: SVT Ablation;  Surgeon: Evans Lance, MD;  Location: [facility];  Service: Cardiovascular;  Laterality: N/A;  . ESOPHAGOGASTRODUODENOSCOPY  10/30/09   VWP:VXYIAX   . FLEXIBLE SIGMOIDOSCOPY N/A 08/25/2015   Procedure: FLEXIBLE SIGMOIDOSCOPY;  Surgeon: Jerene Bears, MD;  Location: [facility];  Service: Endoscopy;  Laterality: N/A;  . TEE WITHOUT CARDIOVERSION N/A 08/22/2015   Procedure: TRANSESOPHAGEAL ECHOCARDIOGRAM (TEE);  Surgeon: Larey Dresser, MD;  Location: [facility];  Service: Cardiovascular;  Laterality: N/A;  . TEE WITHOUT CARDIOVERSION  09/27/2015   Procedure: Transesophageal Echocardiogram (Tee);  Surgeon: Evans Lance, MD;  Location: [facility];  Service: Cardiovascular;;  . TONSILLECTOMY  1950s/1960s    Current Medications: Outpatient Medications Prior to Visit  Medication Sig Dispense Refill  . acetaminophen (TYLENOL) 500 MG tablet Take 1 tablet (500 mg total) by mouth every 6 (six) hours as needed for moderate pain. 30 tablet 0  . albuterol (PROAIR HFA) 108 (90 BASE) MCG/ACT inhaler Inhale 2 puffs into the lungs every 6 (six) hours as needed for wheezing or shortness of breath.     . allopurinol (ZYLOPRIM) 300 MG tablet Take 300 mg by mouth  daily.      . Amino Acids-Protein Hydrolys (FEEDING SUPPLEMENT, PRO-STAT SUGAR FREE 64,) LIQD Take 30 mLs by mouth 3 (three) times daily between meals. 900 mL 0  . amLODipine (NORVASC) 5 MG tablet Take 5 mg by mouth daily.    Marland Kitchen atorvastatin (LIPITOR) 20 MG tablet Take 20 mg by mouth daily.    . Cholecalciferol (VITAMIN D PO) Take 1,000 Units by mouth daily.    . cloNIDine  (CATAPRES) 0.1 MG tablet Take 1 tablet (0.1 mg total) by mouth 2 (two) times daily. 60 tablet 11  . colchicine 0.6 MG tablet Take 0.6 mg by mouth daily.    . collagenase (SANTYL) ointment Apply topically daily. 15 g 0  . feeding supplement, ENSURE ENLIVE, (ENSURE ENLIVE) LIQD Take 237 mLs by mouth 3 (three) times daily between meals. 237 mL 12  . furosemide (LASIX) 40 MG tablet Take 1 tablet (40 mg total) by mouth 2 (two) times daily. 30 tablet   . gabapentin (NEURONTIN) 300 MG capsule Take 300 mg by mouth daily.    Marland Kitchen gabapentin (NEURONTIN) 400 MG capsule Take 400 mg by mouth 2 (two) times daily. Takes at noon & bedtime    . glimepiride (AMARYL) 2 MG tablet Take 2 mg by mouth daily.    Marland Kitchen HUMALOG KWIKPEN 100 UNIT/ML KiwkPen Inject 0-15 Units into the skin See admin instructions. Per sliding scale    . hydrALAZINE (APRESOLINE) 25 MG tablet Take 3 tablets (75 mg total) by mouth every 8 (eight) hours. (Patient taking differently: Take 75 mg by mouth 3 (three) times daily. 75 mg q 8 hrs)    . HYDROcodone-acetaminophen (NORCO/VICODIN) 5-325 MG tablet Take 1 tablet by mouth every 6 (six) hours as needed for severe pain. (Patient taking differently: Take 1 tablet by mouth every 4 (four) hours as needed for severe pain. ) 10 tablet 0  . insulin aspart (NOVOLOG) 100 UNIT/ML injection Inject 0-15 Units into the skin 3 (three) times daily with meals. 10 mL 11  . insulin glargine (LANTUS) 100 UNIT/ML injection Inject 34 Units into the skin at bedtime.    . isosorbide mononitrate (IMDUR) 60 MG 24 hr tablet Take 1 tablet (60 mg total) by mouth daily. 30 tablet 0  . lanolin ointment Apply 1 application topically 2 (two) times daily as needed for dry skin.    Marland Kitchen LINZESS 290 MCG CAPS capsule TAKE 1 CAPSULE ONCE DAILY 30 MINUTES BEFORE MEALS (Patient taking differently: take 1 capsule by mouth 3 times a day. give 30 minutes prior to first meal) 90 capsule 3  . loperamide (IMODIUM) 2 MG capsule Take 2 mg by mouth daily  as needed for constipation.    . metoprolol succinate (TOPROL-XL) 100 MG 24 hr tablet Take 1 tablet (100 mg total) by mouth 2 (two) times daily. Take with or immediately following a meal. 60 tablet 3  . Multiple Vitamin (MULTIVITAMIN WITH MINERALS) TABS tablet Take 1 tablet by mouth daily.    Marland Kitchen omeprazole (PRILOSEC) 20 MG capsule Take 20 mg by mouth daily.    . ondansetron (ZOFRAN) 4 MG tablet Take 1 tablet (4 mg total) by mouth every 8 (eight) hours as needed for nausea or vomiting. 30 tablet 1  . oxybutynin (DITROPAN) 5 MG tablet Take 1 tablet (5 mg total) by mouth 3 (three) times daily. 30 tablet 0  . pantoprazole (PROTONIX) 40 MG tablet Take 1 tablet (40 mg total) by mouth daily.    Marland Kitchen senna-docusate (SENOKOT-S) 8.6-50 MG  tablet Take 1 tablet by mouth 2 (two) times daily.    Nelva Nay SOLOSTAR 300 UNIT/ML SOPN 15 Units every morning. Take 15- 25 units daily as needed    . vitamin C (ASCORBIC ACID) 500 MG tablet Take 500 mg by mouth 2 (two) times daily.    Marland Kitchen zinc sulfate 220 (50 Zn) MG capsule Take 220 mg by mouth daily. X 60 days started 09/29/15    . zolpidem (AMBIEN) 10 MG tablet Take 10 mg by mouth at bedtime.      No facility-administered medications prior to visit.      Allergies:   Review of patient's allergies indicates no known allergies.   Social History   Social History  . Marital status: Married    Spouse name: N/A  . Number of children: 2  . Years of education: N/A   Occupational History  . retired    Social History Main Topics  . Smoking status: Former Smoker    Packs/day: 1.00    Years: 4.00    Types: Cigarettes    Quit date: 08/26/2010  . Smokeless tobacco: Never Used  . Alcohol use No     Comment: "stopped in 2012 when I quit smoking"  . Drug use: No  . Sexual activity: Not Currently   Other Topics Concern  . Not on file   Social History Narrative  . No narrative on file     Family History:  The patient's ***family history includes COPD in his father;  Diabetes in his mother; Hypertension in his mother.   ROS:   Please see the history of present illness.    ROS All other systems reviewed and are negative.   PHYSICAL EXAM:   VS:  There were no vitals taken for this visit.  Physical Exam  GEN: Well nourished, well developed, in no acute distress  HEENT: normal  Neck: no JVD, carotid bruits, or masses Cardiac:RRR; no murmurs, rubs, or gallops  Respiratory:  clear to auscultation bilaterally, normal work of breathing GI: soft, nontender, nondistended, + BS Ext: without cyanosis, clubbing, or edema, Good distal pulses bilaterally MS: no deformity or atrophy  Skin: warm and dry, no rash Neuro:  Alert and Oriented x 3, Strength and sensation are intact Psych: euthymic mood, full affect  Wt Readings from Last 3 Encounters:  10/26/15 205 lb (93 kg)  10/18/15 209 lb (94.8 kg)  10/17/15 212 lb (96.2 kg)      Studies/Labs Reviewed:   EKG:  EKG is*** ordered today.  The ekg ordered today demonstrates ***  Recent Labs: 08/14/2015: B Natriuretic Peptide 609.0; TSH 1.917 09/06/2015: ALT 17 10/18/2015: BUN 48; Creatinine, Ser 2.27; Hemoglobin 10.8; Magnesium 1.7; Platelets 330; Potassium 3.0; Sodium 133   Lipid Panel    Component Value Date/Time   CHOL 79 08/15/2015 0422   TRIG 38 08/15/2015 0422   HDL 34 (L) 08/15/2015 0422   CHOLHDL 2.3 08/15/2015 0422   VLDL 8 08/15/2015 0422   LDLCALC 37 08/15/2015 0422    Additional studies/ records that were reviewed today include:  ***    ASSESSMENT:    No diagnosis found.   PLAN:  In order of problems listed above:      Medication Adjustments/Labs and Tests Ordered: Current medicines are reviewed at length with the patient today.  Concerns regarding medicines are outlined above.  Medication changes, Labs and Tests ordered today are listed in the Patient Instructions below. There are no Patient Instructions on file for this visit.  Sumner Boast, PA-C  11/08/2015  11:10 AM    [facility] [place], [place], [place]  [address] Phone: [phone]; Fax: [phone]

## 2015-11-09 DIAGNOSIS — N184 Chronic kidney disease, stage 4 (severe): Secondary | ICD-10-CM | POA: Diagnosis not present

## 2015-11-09 DIAGNOSIS — G4733 Obstructive sleep apnea (adult) (pediatric): Secondary | ICD-10-CM | POA: Diagnosis not present

## 2015-11-09 DIAGNOSIS — I2510 Atherosclerotic heart disease of native coronary artery without angina pectoris: Secondary | ICD-10-CM | POA: Diagnosis not present

## 2015-11-09 DIAGNOSIS — G894 Chronic pain syndrome: Secondary | ICD-10-CM | POA: Diagnosis not present

## 2015-11-09 DIAGNOSIS — I5021 Acute systolic (congestive) heart failure: Secondary | ICD-10-CM | POA: Diagnosis not present

## 2015-11-09 DIAGNOSIS — L89153 Pressure ulcer of sacral region, stage 3: Secondary | ICD-10-CM | POA: Diagnosis not present

## 2015-11-09 DIAGNOSIS — E1140 Type 2 diabetes mellitus with diabetic neuropathy, unspecified: Secondary | ICD-10-CM | POA: Diagnosis not present

## 2015-11-09 DIAGNOSIS — E1122 Type 2 diabetes mellitus with diabetic chronic kidney disease: Secondary | ICD-10-CM | POA: Diagnosis not present

## 2015-11-09 DIAGNOSIS — D464 Refractory anemia, unspecified: Secondary | ICD-10-CM | POA: Diagnosis not present

## 2015-11-09 DIAGNOSIS — J449 Chronic obstructive pulmonary disease, unspecified: Secondary | ICD-10-CM | POA: Diagnosis not present

## 2015-11-09 DIAGNOSIS — R339 Retention of urine, unspecified: Secondary | ICD-10-CM | POA: Diagnosis not present

## 2015-11-09 DIAGNOSIS — I130 Hypertensive heart and chronic kidney disease with heart failure and stage 1 through stage 4 chronic kidney disease, or unspecified chronic kidney disease: Secondary | ICD-10-CM | POA: Diagnosis not present

## 2015-11-10 DIAGNOSIS — L89153 Pressure ulcer of sacral region, stage 3: Secondary | ICD-10-CM | POA: Diagnosis not present

## 2015-11-10 DIAGNOSIS — G4733 Obstructive sleep apnea (adult) (pediatric): Secondary | ICD-10-CM | POA: Diagnosis not present

## 2015-11-10 DIAGNOSIS — I5021 Acute systolic (congestive) heart failure: Secondary | ICD-10-CM | POA: Diagnosis not present

## 2015-11-10 DIAGNOSIS — E161 Other hypoglycemia: Secondary | ICD-10-CM | POA: Diagnosis not present

## 2015-11-10 DIAGNOSIS — J449 Chronic obstructive pulmonary disease, unspecified: Secondary | ICD-10-CM | POA: Diagnosis not present

## 2015-11-10 DIAGNOSIS — N184 Chronic kidney disease, stage 4 (severe): Secondary | ICD-10-CM | POA: Diagnosis not present

## 2015-11-10 DIAGNOSIS — G894 Chronic pain syndrome: Secondary | ICD-10-CM | POA: Diagnosis not present

## 2015-11-10 DIAGNOSIS — R402421 Glasgow coma scale score 9-12, in the field [EMT or ambulance]: Secondary | ICD-10-CM | POA: Diagnosis not present

## 2015-11-10 DIAGNOSIS — I2510 Atherosclerotic heart disease of native coronary artery without angina pectoris: Secondary | ICD-10-CM | POA: Diagnosis not present

## 2015-11-10 DIAGNOSIS — R339 Retention of urine, unspecified: Secondary | ICD-10-CM | POA: Diagnosis not present

## 2015-11-10 DIAGNOSIS — I130 Hypertensive heart and chronic kidney disease with heart failure and stage 1 through stage 4 chronic kidney disease, or unspecified chronic kidney disease: Secondary | ICD-10-CM | POA: Diagnosis not present

## 2015-11-10 DIAGNOSIS — E1140 Type 2 diabetes mellitus with diabetic neuropathy, unspecified: Secondary | ICD-10-CM | POA: Diagnosis not present

## 2015-11-10 DIAGNOSIS — D464 Refractory anemia, unspecified: Secondary | ICD-10-CM | POA: Diagnosis not present

## 2015-11-10 DIAGNOSIS — E1122 Type 2 diabetes mellitus with diabetic chronic kidney disease: Secondary | ICD-10-CM | POA: Diagnosis not present

## 2015-11-14 DIAGNOSIS — G4733 Obstructive sleep apnea (adult) (pediatric): Secondary | ICD-10-CM | POA: Diagnosis not present

## 2015-11-14 DIAGNOSIS — J449 Chronic obstructive pulmonary disease, unspecified: Secondary | ICD-10-CM | POA: Diagnosis not present

## 2015-11-14 DIAGNOSIS — N184 Chronic kidney disease, stage 4 (severe): Secondary | ICD-10-CM | POA: Diagnosis not present

## 2015-11-14 DIAGNOSIS — I2510 Atherosclerotic heart disease of native coronary artery without angina pectoris: Secondary | ICD-10-CM | POA: Diagnosis not present

## 2015-11-14 DIAGNOSIS — G894 Chronic pain syndrome: Secondary | ICD-10-CM | POA: Diagnosis not present

## 2015-11-14 DIAGNOSIS — I130 Hypertensive heart and chronic kidney disease with heart failure and stage 1 through stage 4 chronic kidney disease, or unspecified chronic kidney disease: Secondary | ICD-10-CM | POA: Diagnosis not present

## 2015-11-14 DIAGNOSIS — L8994 Pressure ulcer of unspecified site, stage 4: Secondary | ICD-10-CM | POA: Diagnosis not present

## 2015-11-14 DIAGNOSIS — D464 Refractory anemia, unspecified: Secondary | ICD-10-CM | POA: Diagnosis not present

## 2015-11-14 DIAGNOSIS — L89153 Pressure ulcer of sacral region, stage 3: Secondary | ICD-10-CM | POA: Diagnosis not present

## 2015-11-14 DIAGNOSIS — E1140 Type 2 diabetes mellitus with diabetic neuropathy, unspecified: Secondary | ICD-10-CM | POA: Diagnosis not present

## 2015-11-14 DIAGNOSIS — E1121 Type 2 diabetes mellitus with diabetic nephropathy: Secondary | ICD-10-CM | POA: Diagnosis not present

## 2015-11-14 DIAGNOSIS — E1122 Type 2 diabetes mellitus with diabetic chronic kidney disease: Secondary | ICD-10-CM | POA: Diagnosis not present

## 2015-11-14 DIAGNOSIS — I5021 Acute systolic (congestive) heart failure: Secondary | ICD-10-CM | POA: Diagnosis not present

## 2015-11-14 DIAGNOSIS — I129 Hypertensive chronic kidney disease with stage 1 through stage 4 chronic kidney disease, or unspecified chronic kidney disease: Secondary | ICD-10-CM | POA: Diagnosis not present

## 2015-11-14 DIAGNOSIS — R339 Retention of urine, unspecified: Secondary | ICD-10-CM | POA: Diagnosis not present

## 2015-11-15 DIAGNOSIS — I5021 Acute systolic (congestive) heart failure: Secondary | ICD-10-CM | POA: Diagnosis not present

## 2015-11-15 DIAGNOSIS — I130 Hypertensive heart and chronic kidney disease with heart failure and stage 1 through stage 4 chronic kidney disease, or unspecified chronic kidney disease: Secondary | ICD-10-CM | POA: Diagnosis not present

## 2015-11-15 DIAGNOSIS — G894 Chronic pain syndrome: Secondary | ICD-10-CM | POA: Diagnosis not present

## 2015-11-15 DIAGNOSIS — R339 Retention of urine, unspecified: Secondary | ICD-10-CM | POA: Diagnosis not present

## 2015-11-15 DIAGNOSIS — L89153 Pressure ulcer of sacral region, stage 3: Secondary | ICD-10-CM | POA: Diagnosis not present

## 2015-11-15 DIAGNOSIS — D464 Refractory anemia, unspecified: Secondary | ICD-10-CM | POA: Diagnosis not present

## 2015-11-15 DIAGNOSIS — E1140 Type 2 diabetes mellitus with diabetic neuropathy, unspecified: Secondary | ICD-10-CM | POA: Diagnosis not present

## 2015-11-15 DIAGNOSIS — J449 Chronic obstructive pulmonary disease, unspecified: Secondary | ICD-10-CM | POA: Diagnosis not present

## 2015-11-15 DIAGNOSIS — E1122 Type 2 diabetes mellitus with diabetic chronic kidney disease: Secondary | ICD-10-CM | POA: Diagnosis not present

## 2015-11-15 DIAGNOSIS — G4733 Obstructive sleep apnea (adult) (pediatric): Secondary | ICD-10-CM | POA: Diagnosis not present

## 2015-11-15 DIAGNOSIS — I2510 Atherosclerotic heart disease of native coronary artery without angina pectoris: Secondary | ICD-10-CM | POA: Diagnosis not present

## 2015-11-15 DIAGNOSIS — N184 Chronic kidney disease, stage 4 (severe): Secondary | ICD-10-CM | POA: Diagnosis not present

## 2015-11-16 DIAGNOSIS — E1140 Type 2 diabetes mellitus with diabetic neuropathy, unspecified: Secondary | ICD-10-CM | POA: Diagnosis not present

## 2015-11-16 DIAGNOSIS — J449 Chronic obstructive pulmonary disease, unspecified: Secondary | ICD-10-CM | POA: Diagnosis not present

## 2015-11-16 DIAGNOSIS — E1142 Type 2 diabetes mellitus with diabetic polyneuropathy: Secondary | ICD-10-CM | POA: Diagnosis not present

## 2015-11-16 DIAGNOSIS — L84 Corns and callosities: Secondary | ICD-10-CM | POA: Diagnosis not present

## 2015-11-16 DIAGNOSIS — B351 Tinea unguium: Secondary | ICD-10-CM | POA: Diagnosis not present

## 2015-11-16 DIAGNOSIS — G894 Chronic pain syndrome: Secondary | ICD-10-CM | POA: Diagnosis not present

## 2015-11-16 DIAGNOSIS — I130 Hypertensive heart and chronic kidney disease with heart failure and stage 1 through stage 4 chronic kidney disease, or unspecified chronic kidney disease: Secondary | ICD-10-CM | POA: Diagnosis not present

## 2015-11-16 DIAGNOSIS — I2510 Atherosclerotic heart disease of native coronary artery without angina pectoris: Secondary | ICD-10-CM | POA: Diagnosis not present

## 2015-11-16 DIAGNOSIS — L89153 Pressure ulcer of sacral region, stage 3: Secondary | ICD-10-CM | POA: Diagnosis not present

## 2015-11-16 DIAGNOSIS — N184 Chronic kidney disease, stage 4 (severe): Secondary | ICD-10-CM | POA: Diagnosis not present

## 2015-11-16 DIAGNOSIS — I5021 Acute systolic (congestive) heart failure: Secondary | ICD-10-CM | POA: Diagnosis not present

## 2015-11-16 DIAGNOSIS — R339 Retention of urine, unspecified: Secondary | ICD-10-CM | POA: Diagnosis not present

## 2015-11-16 DIAGNOSIS — D464 Refractory anemia, unspecified: Secondary | ICD-10-CM | POA: Diagnosis not present

## 2015-11-16 DIAGNOSIS — G4733 Obstructive sleep apnea (adult) (pediatric): Secondary | ICD-10-CM | POA: Diagnosis not present

## 2015-11-16 DIAGNOSIS — E1122 Type 2 diabetes mellitus with diabetic chronic kidney disease: Secondary | ICD-10-CM | POA: Diagnosis not present

## 2015-11-17 DIAGNOSIS — E1122 Type 2 diabetes mellitus with diabetic chronic kidney disease: Secondary | ICD-10-CM | POA: Diagnosis not present

## 2015-11-17 DIAGNOSIS — I130 Hypertensive heart and chronic kidney disease with heart failure and stage 1 through stage 4 chronic kidney disease, or unspecified chronic kidney disease: Secondary | ICD-10-CM | POA: Diagnosis not present

## 2015-11-17 DIAGNOSIS — L89153 Pressure ulcer of sacral region, stage 3: Secondary | ICD-10-CM | POA: Diagnosis not present

## 2015-11-17 DIAGNOSIS — G894 Chronic pain syndrome: Secondary | ICD-10-CM | POA: Diagnosis not present

## 2015-11-17 DIAGNOSIS — D464 Refractory anemia, unspecified: Secondary | ICD-10-CM | POA: Diagnosis not present

## 2015-11-17 DIAGNOSIS — G4733 Obstructive sleep apnea (adult) (pediatric): Secondary | ICD-10-CM | POA: Diagnosis not present

## 2015-11-17 DIAGNOSIS — E1140 Type 2 diabetes mellitus with diabetic neuropathy, unspecified: Secondary | ICD-10-CM | POA: Diagnosis not present

## 2015-11-17 DIAGNOSIS — N184 Chronic kidney disease, stage 4 (severe): Secondary | ICD-10-CM | POA: Diagnosis not present

## 2015-11-17 DIAGNOSIS — I5021 Acute systolic (congestive) heart failure: Secondary | ICD-10-CM | POA: Diagnosis not present

## 2015-11-17 DIAGNOSIS — J449 Chronic obstructive pulmonary disease, unspecified: Secondary | ICD-10-CM | POA: Diagnosis not present

## 2015-11-17 DIAGNOSIS — R339 Retention of urine, unspecified: Secondary | ICD-10-CM | POA: Diagnosis not present

## 2015-11-17 DIAGNOSIS — I2510 Atherosclerotic heart disease of native coronary artery without angina pectoris: Secondary | ICD-10-CM | POA: Diagnosis not present

## 2015-11-20 DIAGNOSIS — I5021 Acute systolic (congestive) heart failure: Secondary | ICD-10-CM | POA: Diagnosis not present

## 2015-11-20 DIAGNOSIS — D464 Refractory anemia, unspecified: Secondary | ICD-10-CM | POA: Diagnosis not present

## 2015-11-20 DIAGNOSIS — I130 Hypertensive heart and chronic kidney disease with heart failure and stage 1 through stage 4 chronic kidney disease, or unspecified chronic kidney disease: Secondary | ICD-10-CM | POA: Diagnosis not present

## 2015-11-20 DIAGNOSIS — N184 Chronic kidney disease, stage 4 (severe): Secondary | ICD-10-CM | POA: Diagnosis not present

## 2015-11-20 DIAGNOSIS — G4733 Obstructive sleep apnea (adult) (pediatric): Secondary | ICD-10-CM | POA: Diagnosis not present

## 2015-11-20 DIAGNOSIS — L89153 Pressure ulcer of sacral region, stage 3: Secondary | ICD-10-CM | POA: Diagnosis not present

## 2015-11-20 DIAGNOSIS — I2510 Atherosclerotic heart disease of native coronary artery without angina pectoris: Secondary | ICD-10-CM | POA: Diagnosis not present

## 2015-11-20 DIAGNOSIS — J449 Chronic obstructive pulmonary disease, unspecified: Secondary | ICD-10-CM | POA: Diagnosis not present

## 2015-11-20 DIAGNOSIS — G894 Chronic pain syndrome: Secondary | ICD-10-CM | POA: Diagnosis not present

## 2015-11-20 DIAGNOSIS — E1122 Type 2 diabetes mellitus with diabetic chronic kidney disease: Secondary | ICD-10-CM | POA: Diagnosis not present

## 2015-11-20 DIAGNOSIS — E1140 Type 2 diabetes mellitus with diabetic neuropathy, unspecified: Secondary | ICD-10-CM | POA: Diagnosis not present

## 2015-11-20 DIAGNOSIS — R339 Retention of urine, unspecified: Secondary | ICD-10-CM | POA: Diagnosis not present

## 2015-11-21 DIAGNOSIS — N184 Chronic kidney disease, stage 4 (severe): Secondary | ICD-10-CM | POA: Diagnosis not present

## 2015-11-21 DIAGNOSIS — G4733 Obstructive sleep apnea (adult) (pediatric): Secondary | ICD-10-CM | POA: Diagnosis not present

## 2015-11-21 DIAGNOSIS — D464 Refractory anemia, unspecified: Secondary | ICD-10-CM | POA: Diagnosis not present

## 2015-11-21 DIAGNOSIS — I130 Hypertensive heart and chronic kidney disease with heart failure and stage 1 through stage 4 chronic kidney disease, or unspecified chronic kidney disease: Secondary | ICD-10-CM | POA: Diagnosis not present

## 2015-11-21 DIAGNOSIS — L89153 Pressure ulcer of sacral region, stage 3: Secondary | ICD-10-CM | POA: Diagnosis not present

## 2015-11-21 DIAGNOSIS — G894 Chronic pain syndrome: Secondary | ICD-10-CM | POA: Diagnosis not present

## 2015-11-21 DIAGNOSIS — E1140 Type 2 diabetes mellitus with diabetic neuropathy, unspecified: Secondary | ICD-10-CM | POA: Diagnosis not present

## 2015-11-21 DIAGNOSIS — E1122 Type 2 diabetes mellitus with diabetic chronic kidney disease: Secondary | ICD-10-CM | POA: Diagnosis not present

## 2015-11-21 DIAGNOSIS — I2510 Atherosclerotic heart disease of native coronary artery without angina pectoris: Secondary | ICD-10-CM | POA: Diagnosis not present

## 2015-11-21 DIAGNOSIS — J449 Chronic obstructive pulmonary disease, unspecified: Secondary | ICD-10-CM | POA: Diagnosis not present

## 2015-11-21 DIAGNOSIS — R339 Retention of urine, unspecified: Secondary | ICD-10-CM | POA: Diagnosis not present

## 2015-11-21 DIAGNOSIS — I5021 Acute systolic (congestive) heart failure: Secondary | ICD-10-CM | POA: Diagnosis not present

## 2015-11-22 DIAGNOSIS — G4733 Obstructive sleep apnea (adult) (pediatric): Secondary | ICD-10-CM | POA: Diagnosis not present

## 2015-11-22 DIAGNOSIS — R339 Retention of urine, unspecified: Secondary | ICD-10-CM | POA: Diagnosis not present

## 2015-11-22 DIAGNOSIS — G894 Chronic pain syndrome: Secondary | ICD-10-CM | POA: Diagnosis not present

## 2015-11-22 DIAGNOSIS — L89153 Pressure ulcer of sacral region, stage 3: Secondary | ICD-10-CM | POA: Diagnosis not present

## 2015-11-22 DIAGNOSIS — E1140 Type 2 diabetes mellitus with diabetic neuropathy, unspecified: Secondary | ICD-10-CM | POA: Diagnosis not present

## 2015-11-22 DIAGNOSIS — N184 Chronic kidney disease, stage 4 (severe): Secondary | ICD-10-CM | POA: Diagnosis not present

## 2015-11-22 DIAGNOSIS — D464 Refractory anemia, unspecified: Secondary | ICD-10-CM | POA: Diagnosis not present

## 2015-11-22 DIAGNOSIS — I130 Hypertensive heart and chronic kidney disease with heart failure and stage 1 through stage 4 chronic kidney disease, or unspecified chronic kidney disease: Secondary | ICD-10-CM | POA: Diagnosis not present

## 2015-11-22 DIAGNOSIS — I2510 Atherosclerotic heart disease of native coronary artery without angina pectoris: Secondary | ICD-10-CM | POA: Diagnosis not present

## 2015-11-22 DIAGNOSIS — I5021 Acute systolic (congestive) heart failure: Secondary | ICD-10-CM | POA: Diagnosis not present

## 2015-11-22 DIAGNOSIS — E1122 Type 2 diabetes mellitus with diabetic chronic kidney disease: Secondary | ICD-10-CM | POA: Diagnosis not present

## 2015-11-22 DIAGNOSIS — J449 Chronic obstructive pulmonary disease, unspecified: Secondary | ICD-10-CM | POA: Diagnosis not present

## 2015-11-22 DIAGNOSIS — R3914 Feeling of incomplete bladder emptying: Secondary | ICD-10-CM | POA: Diagnosis not present

## 2015-11-24 DIAGNOSIS — N184 Chronic kidney disease, stage 4 (severe): Secondary | ICD-10-CM | POA: Diagnosis not present

## 2015-11-24 DIAGNOSIS — E1140 Type 2 diabetes mellitus with diabetic neuropathy, unspecified: Secondary | ICD-10-CM | POA: Diagnosis not present

## 2015-11-24 DIAGNOSIS — G4733 Obstructive sleep apnea (adult) (pediatric): Secondary | ICD-10-CM | POA: Diagnosis not present

## 2015-11-24 DIAGNOSIS — D464 Refractory anemia, unspecified: Secondary | ICD-10-CM | POA: Diagnosis not present

## 2015-11-24 DIAGNOSIS — E1122 Type 2 diabetes mellitus with diabetic chronic kidney disease: Secondary | ICD-10-CM | POA: Diagnosis not present

## 2015-11-24 DIAGNOSIS — I2510 Atherosclerotic heart disease of native coronary artery without angina pectoris: Secondary | ICD-10-CM | POA: Diagnosis not present

## 2015-11-24 DIAGNOSIS — I130 Hypertensive heart and chronic kidney disease with heart failure and stage 1 through stage 4 chronic kidney disease, or unspecified chronic kidney disease: Secondary | ICD-10-CM | POA: Diagnosis not present

## 2015-11-24 DIAGNOSIS — G894 Chronic pain syndrome: Secondary | ICD-10-CM | POA: Diagnosis not present

## 2015-11-24 DIAGNOSIS — L89153 Pressure ulcer of sacral region, stage 3: Secondary | ICD-10-CM | POA: Diagnosis not present

## 2015-11-24 DIAGNOSIS — J449 Chronic obstructive pulmonary disease, unspecified: Secondary | ICD-10-CM | POA: Diagnosis not present

## 2015-11-24 DIAGNOSIS — R339 Retention of urine, unspecified: Secondary | ICD-10-CM | POA: Diagnosis not present

## 2015-11-24 DIAGNOSIS — I5021 Acute systolic (congestive) heart failure: Secondary | ICD-10-CM | POA: Diagnosis not present

## 2015-11-25 DIAGNOSIS — R0902 Hypoxemia: Secondary | ICD-10-CM | POA: Diagnosis not present

## 2015-11-25 DIAGNOSIS — G4733 Obstructive sleep apnea (adult) (pediatric): Secondary | ICD-10-CM | POA: Diagnosis not present

## 2015-11-27 DIAGNOSIS — I130 Hypertensive heart and chronic kidney disease with heart failure and stage 1 through stage 4 chronic kidney disease, or unspecified chronic kidney disease: Secondary | ICD-10-CM | POA: Diagnosis not present

## 2015-11-27 DIAGNOSIS — G4733 Obstructive sleep apnea (adult) (pediatric): Secondary | ICD-10-CM | POA: Diagnosis not present

## 2015-11-27 DIAGNOSIS — D464 Refractory anemia, unspecified: Secondary | ICD-10-CM | POA: Diagnosis not present

## 2015-11-27 DIAGNOSIS — N184 Chronic kidney disease, stage 4 (severe): Secondary | ICD-10-CM | POA: Diagnosis not present

## 2015-11-27 DIAGNOSIS — I2510 Atherosclerotic heart disease of native coronary artery without angina pectoris: Secondary | ICD-10-CM | POA: Diagnosis not present

## 2015-11-27 DIAGNOSIS — E1140 Type 2 diabetes mellitus with diabetic neuropathy, unspecified: Secondary | ICD-10-CM | POA: Diagnosis not present

## 2015-11-27 DIAGNOSIS — J449 Chronic obstructive pulmonary disease, unspecified: Secondary | ICD-10-CM | POA: Diagnosis not present

## 2015-11-27 DIAGNOSIS — I5021 Acute systolic (congestive) heart failure: Secondary | ICD-10-CM | POA: Diagnosis not present

## 2015-11-27 DIAGNOSIS — R339 Retention of urine, unspecified: Secondary | ICD-10-CM | POA: Diagnosis not present

## 2015-11-27 DIAGNOSIS — G894 Chronic pain syndrome: Secondary | ICD-10-CM | POA: Diagnosis not present

## 2015-11-27 DIAGNOSIS — L89153 Pressure ulcer of sacral region, stage 3: Secondary | ICD-10-CM | POA: Diagnosis not present

## 2015-11-27 DIAGNOSIS — E1122 Type 2 diabetes mellitus with diabetic chronic kidney disease: Secondary | ICD-10-CM | POA: Diagnosis not present

## 2015-11-28 DIAGNOSIS — J449 Chronic obstructive pulmonary disease, unspecified: Secondary | ICD-10-CM | POA: Diagnosis not present

## 2015-11-28 DIAGNOSIS — E1140 Type 2 diabetes mellitus with diabetic neuropathy, unspecified: Secondary | ICD-10-CM | POA: Diagnosis not present

## 2015-11-28 DIAGNOSIS — I130 Hypertensive heart and chronic kidney disease with heart failure and stage 1 through stage 4 chronic kidney disease, or unspecified chronic kidney disease: Secondary | ICD-10-CM | POA: Diagnosis not present

## 2015-11-28 DIAGNOSIS — N184 Chronic kidney disease, stage 4 (severe): Secondary | ICD-10-CM | POA: Diagnosis not present

## 2015-11-28 DIAGNOSIS — R339 Retention of urine, unspecified: Secondary | ICD-10-CM | POA: Diagnosis not present

## 2015-11-28 DIAGNOSIS — I5021 Acute systolic (congestive) heart failure: Secondary | ICD-10-CM | POA: Diagnosis not present

## 2015-11-28 DIAGNOSIS — G4733 Obstructive sleep apnea (adult) (pediatric): Secondary | ICD-10-CM | POA: Diagnosis not present

## 2015-11-28 DIAGNOSIS — E1122 Type 2 diabetes mellitus with diabetic chronic kidney disease: Secondary | ICD-10-CM | POA: Diagnosis not present

## 2015-11-28 DIAGNOSIS — I2510 Atherosclerotic heart disease of native coronary artery without angina pectoris: Secondary | ICD-10-CM | POA: Diagnosis not present

## 2015-11-28 DIAGNOSIS — M545 Low back pain: Secondary | ICD-10-CM | POA: Diagnosis not present

## 2015-11-28 DIAGNOSIS — D464 Refractory anemia, unspecified: Secondary | ICD-10-CM | POA: Diagnosis not present

## 2015-11-28 DIAGNOSIS — G894 Chronic pain syndrome: Secondary | ICD-10-CM | POA: Diagnosis not present

## 2015-11-28 DIAGNOSIS — L89153 Pressure ulcer of sacral region, stage 3: Secondary | ICD-10-CM | POA: Diagnosis not present

## 2015-11-29 DIAGNOSIS — I5021 Acute systolic (congestive) heart failure: Secondary | ICD-10-CM | POA: Diagnosis not present

## 2015-11-29 DIAGNOSIS — N184 Chronic kidney disease, stage 4 (severe): Secondary | ICD-10-CM | POA: Diagnosis not present

## 2015-11-29 DIAGNOSIS — J449 Chronic obstructive pulmonary disease, unspecified: Secondary | ICD-10-CM | POA: Diagnosis not present

## 2015-11-29 DIAGNOSIS — I2510 Atherosclerotic heart disease of native coronary artery without angina pectoris: Secondary | ICD-10-CM | POA: Diagnosis not present

## 2015-11-29 DIAGNOSIS — R339 Retention of urine, unspecified: Secondary | ICD-10-CM | POA: Diagnosis not present

## 2015-11-29 DIAGNOSIS — D464 Refractory anemia, unspecified: Secondary | ICD-10-CM | POA: Diagnosis not present

## 2015-11-29 DIAGNOSIS — L89153 Pressure ulcer of sacral region, stage 3: Secondary | ICD-10-CM | POA: Diagnosis not present

## 2015-11-29 DIAGNOSIS — E1122 Type 2 diabetes mellitus with diabetic chronic kidney disease: Secondary | ICD-10-CM | POA: Diagnosis not present

## 2015-11-29 DIAGNOSIS — G4733 Obstructive sleep apnea (adult) (pediatric): Secondary | ICD-10-CM | POA: Diagnosis not present

## 2015-11-29 DIAGNOSIS — I130 Hypertensive heart and chronic kidney disease with heart failure and stage 1 through stage 4 chronic kidney disease, or unspecified chronic kidney disease: Secondary | ICD-10-CM | POA: Diagnosis not present

## 2015-11-29 DIAGNOSIS — G894 Chronic pain syndrome: Secondary | ICD-10-CM | POA: Diagnosis not present

## 2015-11-29 DIAGNOSIS — E1140 Type 2 diabetes mellitus with diabetic neuropathy, unspecified: Secondary | ICD-10-CM | POA: Diagnosis not present

## 2015-11-30 DIAGNOSIS — N184 Chronic kidney disease, stage 4 (severe): Secondary | ICD-10-CM | POA: Diagnosis not present

## 2015-11-30 DIAGNOSIS — I5021 Acute systolic (congestive) heart failure: Secondary | ICD-10-CM | POA: Diagnosis not present

## 2015-11-30 DIAGNOSIS — J449 Chronic obstructive pulmonary disease, unspecified: Secondary | ICD-10-CM | POA: Diagnosis not present

## 2015-11-30 DIAGNOSIS — I2510 Atherosclerotic heart disease of native coronary artery without angina pectoris: Secondary | ICD-10-CM | POA: Diagnosis not present

## 2015-11-30 DIAGNOSIS — G4733 Obstructive sleep apnea (adult) (pediatric): Secondary | ICD-10-CM | POA: Diagnosis not present

## 2015-11-30 DIAGNOSIS — I130 Hypertensive heart and chronic kidney disease with heart failure and stage 1 through stage 4 chronic kidney disease, or unspecified chronic kidney disease: Secondary | ICD-10-CM | POA: Diagnosis not present

## 2015-11-30 DIAGNOSIS — E1140 Type 2 diabetes mellitus with diabetic neuropathy, unspecified: Secondary | ICD-10-CM | POA: Diagnosis not present

## 2015-11-30 DIAGNOSIS — E1122 Type 2 diabetes mellitus with diabetic chronic kidney disease: Secondary | ICD-10-CM | POA: Diagnosis not present

## 2015-11-30 DIAGNOSIS — G894 Chronic pain syndrome: Secondary | ICD-10-CM | POA: Diagnosis not present

## 2015-11-30 DIAGNOSIS — D464 Refractory anemia, unspecified: Secondary | ICD-10-CM | POA: Diagnosis not present

## 2015-11-30 DIAGNOSIS — R339 Retention of urine, unspecified: Secondary | ICD-10-CM | POA: Diagnosis not present

## 2015-11-30 DIAGNOSIS — L89153 Pressure ulcer of sacral region, stage 3: Secondary | ICD-10-CM | POA: Diagnosis not present

## 2015-12-02 DIAGNOSIS — R2681 Unsteadiness on feet: Secondary | ICD-10-CM | POA: Diagnosis not present

## 2015-12-02 DIAGNOSIS — M6281 Muscle weakness (generalized): Secondary | ICD-10-CM | POA: Diagnosis not present

## 2015-12-02 DIAGNOSIS — L89159 Pressure ulcer of sacral region, unspecified stage: Secondary | ICD-10-CM | POA: Diagnosis not present

## 2015-12-02 DIAGNOSIS — J449 Chronic obstructive pulmonary disease, unspecified: Secondary | ICD-10-CM | POA: Diagnosis not present

## 2015-12-02 DIAGNOSIS — I2510 Atherosclerotic heart disease of native coronary artery without angina pectoris: Secondary | ICD-10-CM | POA: Diagnosis not present

## 2015-12-05 DIAGNOSIS — R339 Retention of urine, unspecified: Secondary | ICD-10-CM | POA: Diagnosis not present

## 2015-12-05 DIAGNOSIS — J449 Chronic obstructive pulmonary disease, unspecified: Secondary | ICD-10-CM | POA: Diagnosis not present

## 2015-12-05 DIAGNOSIS — G4733 Obstructive sleep apnea (adult) (pediatric): Secondary | ICD-10-CM | POA: Diagnosis not present

## 2015-12-05 DIAGNOSIS — I2510 Atherosclerotic heart disease of native coronary artery without angina pectoris: Secondary | ICD-10-CM | POA: Diagnosis not present

## 2015-12-05 DIAGNOSIS — I5021 Acute systolic (congestive) heart failure: Secondary | ICD-10-CM | POA: Diagnosis not present

## 2015-12-05 DIAGNOSIS — N184 Chronic kidney disease, stage 4 (severe): Secondary | ICD-10-CM | POA: Diagnosis not present

## 2015-12-05 DIAGNOSIS — E1140 Type 2 diabetes mellitus with diabetic neuropathy, unspecified: Secondary | ICD-10-CM | POA: Diagnosis not present

## 2015-12-05 DIAGNOSIS — E1122 Type 2 diabetes mellitus with diabetic chronic kidney disease: Secondary | ICD-10-CM | POA: Diagnosis not present

## 2015-12-05 DIAGNOSIS — I130 Hypertensive heart and chronic kidney disease with heart failure and stage 1 through stage 4 chronic kidney disease, or unspecified chronic kidney disease: Secondary | ICD-10-CM | POA: Diagnosis not present

## 2015-12-05 DIAGNOSIS — D464 Refractory anemia, unspecified: Secondary | ICD-10-CM | POA: Diagnosis not present

## 2015-12-05 DIAGNOSIS — L89153 Pressure ulcer of sacral region, stage 3: Secondary | ICD-10-CM | POA: Diagnosis not present

## 2015-12-05 DIAGNOSIS — G894 Chronic pain syndrome: Secondary | ICD-10-CM | POA: Diagnosis not present

## 2015-12-07 DIAGNOSIS — I130 Hypertensive heart and chronic kidney disease with heart failure and stage 1 through stage 4 chronic kidney disease, or unspecified chronic kidney disease: Secondary | ICD-10-CM | POA: Diagnosis not present

## 2015-12-07 DIAGNOSIS — E1122 Type 2 diabetes mellitus with diabetic chronic kidney disease: Secondary | ICD-10-CM | POA: Diagnosis not present

## 2015-12-07 DIAGNOSIS — I509 Heart failure, unspecified: Secondary | ICD-10-CM | POA: Diagnosis not present

## 2015-12-07 DIAGNOSIS — R339 Retention of urine, unspecified: Secondary | ICD-10-CM | POA: Diagnosis not present

## 2015-12-07 DIAGNOSIS — I5021 Acute systolic (congestive) heart failure: Secondary | ICD-10-CM | POA: Diagnosis not present

## 2015-12-07 DIAGNOSIS — Z9889 Other specified postprocedural states: Secondary | ICD-10-CM | POA: Diagnosis not present

## 2015-12-07 DIAGNOSIS — Z794 Long term (current) use of insulin: Secondary | ICD-10-CM | POA: Diagnosis not present

## 2015-12-07 DIAGNOSIS — J449 Chronic obstructive pulmonary disease, unspecified: Secondary | ICD-10-CM | POA: Diagnosis not present

## 2015-12-07 DIAGNOSIS — Z87891 Personal history of nicotine dependence: Secondary | ICD-10-CM | POA: Diagnosis not present

## 2015-12-07 DIAGNOSIS — G894 Chronic pain syndrome: Secondary | ICD-10-CM | POA: Diagnosis not present

## 2015-12-07 DIAGNOSIS — N184 Chronic kidney disease, stage 4 (severe): Secondary | ICD-10-CM | POA: Diagnosis not present

## 2015-12-07 DIAGNOSIS — E1140 Type 2 diabetes mellitus with diabetic neuropathy, unspecified: Secondary | ICD-10-CM | POA: Diagnosis not present

## 2015-12-07 DIAGNOSIS — L89153 Pressure ulcer of sacral region, stage 3: Secondary | ICD-10-CM | POA: Diagnosis not present

## 2015-12-07 DIAGNOSIS — Z79899 Other long term (current) drug therapy: Secondary | ICD-10-CM | POA: Diagnosis not present

## 2015-12-07 DIAGNOSIS — D500 Iron deficiency anemia secondary to blood loss (chronic): Secondary | ICD-10-CM | POA: Diagnosis not present

## 2015-12-07 DIAGNOSIS — D464 Refractory anemia, unspecified: Secondary | ICD-10-CM | POA: Diagnosis not present

## 2015-12-07 DIAGNOSIS — I110 Hypertensive heart disease with heart failure: Secondary | ICD-10-CM | POA: Diagnosis not present

## 2015-12-07 DIAGNOSIS — E119 Type 2 diabetes mellitus without complications: Secondary | ICD-10-CM | POA: Diagnosis not present

## 2015-12-07 DIAGNOSIS — D649 Anemia, unspecified: Secondary | ICD-10-CM | POA: Diagnosis not present

## 2015-12-07 DIAGNOSIS — I2510 Atherosclerotic heart disease of native coronary artery without angina pectoris: Secondary | ICD-10-CM | POA: Diagnosis not present

## 2015-12-07 DIAGNOSIS — G4733 Obstructive sleep apnea (adult) (pediatric): Secondary | ICD-10-CM | POA: Diagnosis not present

## 2015-12-08 DIAGNOSIS — G4733 Obstructive sleep apnea (adult) (pediatric): Secondary | ICD-10-CM | POA: Diagnosis not present

## 2015-12-08 DIAGNOSIS — R339 Retention of urine, unspecified: Secondary | ICD-10-CM | POA: Diagnosis not present

## 2015-12-08 DIAGNOSIS — E1140 Type 2 diabetes mellitus with diabetic neuropathy, unspecified: Secondary | ICD-10-CM | POA: Diagnosis not present

## 2015-12-08 DIAGNOSIS — G894 Chronic pain syndrome: Secondary | ICD-10-CM | POA: Diagnosis not present

## 2015-12-08 DIAGNOSIS — D464 Refractory anemia, unspecified: Secondary | ICD-10-CM | POA: Diagnosis not present

## 2015-12-08 DIAGNOSIS — L89153 Pressure ulcer of sacral region, stage 3: Secondary | ICD-10-CM | POA: Diagnosis not present

## 2015-12-08 DIAGNOSIS — N184 Chronic kidney disease, stage 4 (severe): Secondary | ICD-10-CM | POA: Diagnosis not present

## 2015-12-08 DIAGNOSIS — J449 Chronic obstructive pulmonary disease, unspecified: Secondary | ICD-10-CM | POA: Diagnosis not present

## 2015-12-08 DIAGNOSIS — I5021 Acute systolic (congestive) heart failure: Secondary | ICD-10-CM | POA: Diagnosis not present

## 2015-12-08 DIAGNOSIS — E1122 Type 2 diabetes mellitus with diabetic chronic kidney disease: Secondary | ICD-10-CM | POA: Diagnosis not present

## 2015-12-08 DIAGNOSIS — I130 Hypertensive heart and chronic kidney disease with heart failure and stage 1 through stage 4 chronic kidney disease, or unspecified chronic kidney disease: Secondary | ICD-10-CM | POA: Diagnosis not present

## 2015-12-08 DIAGNOSIS — I2510 Atherosclerotic heart disease of native coronary artery without angina pectoris: Secondary | ICD-10-CM | POA: Diagnosis not present

## 2016-01-15 NOTE — Progress Notes (Signed)
Cardiology Office Note    Date:  01/17/2016   ID:  Frank Hoover, DOB Mar 23, 1950, MRN [id number]  PCP:  Alonza Bogus, MD  Cardiologist: Dr. Harl Bowie EPS: Dr. Lovena Le  No chief complaint on file.   History of Present Illness:  Frank Hoover is a 65 y.o. male with multiple medical problems who has had both atrial flutter and SVT (AVNRT). He underwent EP study and catheter ablation of both arrhythmias  Dr. Lovena Le stopped his Amiodarone and Eliquis 10/26/15. Also has chronic systolic CHF EF 28% diffuse HK, moderate RV dysfunction echo 08/2015. No ischemic eval b/c of poor renal function. Crt 2.27 10/2015 1.26 in 12/2015.CKD stage 2-3.  Patient is referred to [place] today by Dr.Befekadu for CHF. Lasix was increased to 40 mg twice a day 12/15/15.He is accompanied by his wife and aid. His weight has gone up from 205 pounds to 227 since August. He has significant lower extremity edema. He usually wears compression stockings but does not have them on today. He denies any chest pain, palpitations, dyspnea, dizziness or presyncope. He does have some dyspnea on exertion. He is not following a low sodium diet. He eats frozen dinners almost daily.    Past Medical History:  Diagnosis Date  . Adenomatous colon polyp 10/30/09   Serrated adenoma removed during Colonoscopy   . Arthritis    "legs" (09/27/2015)  . Atrial flutter with rapid ventricular response ([place]) 09/27/2015  . CKD (chronic kidney disease) stage 3, GFR 30-59 ml/min   . COPD (chronic obstructive pulmonary disease) ([place])   . Diverticulosis of colon   . Dysrhythmia   . Essential hypertension   . Gastroparesis   . GERD (gastroesophageal reflux disease)   . Gout   . Hyperlipemia   . Hypertension   . OA (osteoarthritis)   . Type 2 diabetes mellitus ([place])     Past Surgical History:  Procedure Laterality Date  . CARDIOVERSION N/A 08/22/2015   Procedure: CARDIOVERSION;  Surgeon: Larey Dresser, MD;  Location: [facility];   Service: Cardiovascular;  Laterality: N/A;  . CARDIOVERSION N/A 09/01/2015   Procedure: CARDIOVERSION;  Surgeon: Lelon Perla, MD;  Location: [facility];  Service: Cardiovascular;  Laterality: N/A;  . CATARACT EXTRACTION W/ INTRAOCULAR LENS  IMPLANT, BILATERAL Bilateral   . COLONOSCOPY  10/30/09   JGO:[id number]-[id number] diverticulum/serrated adenoma from ICV, next colonoscopy due 10/2012  . COLONOSCOPY N/A 09/18/2012   TDH:RCBULAG mucosa that was seen appeared normal, however most of it was not seen due to be very poor prep  . COLONOSCOPY N/A 04/08/2013   TXM:IWOEHOZ polyp removed/inadequate preparation  . COLONOSCOPY N/A 06/22/2014   Procedure: COLONOSCOPY;  Surgeon: Daneil Dolin, MD;  [facility];  Service: Endoscopy;  Laterality: N/A;  115   . ELECTROPHYSIOLOGIC STUDY N/A 09/27/2015   Procedure: SVT Ablation;  Surgeon: Evans Lance, MD;  Location: [facility];  Service: Cardiovascular;  Laterality: N/A;  . ESOPHAGOGASTRODUODENOSCOPY  10/30/09   YYQ:MGNOIB   . FLEXIBLE SIGMOIDOSCOPY N/A 08/25/2015   Procedure: FLEXIBLE SIGMOIDOSCOPY;  Surgeon: Jerene Bears, MD;  [facility];  Service: Endoscopy;  Laterality: N/A;  . TEE WITHOUT CARDIOVERSION N/A 08/22/2015   Procedure: TRANSESOPHAGEAL ECHOCARDIOGRAM (TEE);  Surgeon: Larey Dresser, MD;  Location: [facility];  Service: Cardiovascular;  Laterality: N/A;  . TEE WITHOUT CARDIOVERSION  09/27/2015   Procedure: Transesophageal Echocardiogram (Tee);  Surgeon: Evans Lance, MD;  Location: [facility];  Service: Cardiovascular;;  . TONSILLECTOMY  1950s/1960s    Current Medications: Outpatient Medications Prior to Visit  Medication Sig Dispense Refill  . acetaminophen (TYLENOL) 500 MG tablet Take 1 tablet (500 mg total) by mouth every 6 (six) hours as needed for moderate pain. 30 tablet 0  . albuterol (PROAIR HFA) 108 (90 BASE) MCG/ACT inhaler Inhale 2 puffs into the lungs every 6 (six) hours as needed for  wheezing or shortness of breath.     . allopurinol (ZYLOPRIM) 300 MG tablet Take 300 mg by mouth daily.      . Amino Acids-Protein Hydrolys (FEEDING SUPPLEMENT, PRO-STAT SUGAR FREE 64,) LIQD Take 30 mLs by mouth 3 (three) times daily between meals. 900 mL 0  . amLODipine (NORVASC) 5 MG tablet Take 5 mg by mouth daily.    . Cholecalciferol (VITAMIN D PO) Take 1,000 Units by mouth daily.    Marland Kitchen gabapentin (NEURONTIN) 400 MG capsule Take 400 mg by mouth 2 (two) times daily. Takes at noon & bedtime    . glimepiride (AMARYL) 2 MG tablet Take 2 mg by mouth daily.    Marland Kitchen HUMALOG KWIKPEN 100 UNIT/ML KiwkPen Inject 0-15 Units into the skin See admin instructions. Per sliding scale    . hydrALAZINE (APRESOLINE) 25 MG tablet Take 3 tablets (75 mg total) by mouth every 8 (eight) hours. (Patient taking differently: Take 75 mg by mouth 3 (three) times daily. 75 mg q 8 hrs)    . HYDROcodone-acetaminophen (NORCO/VICODIN) 5-325 MG tablet Take 1 tablet by mouth every 6 (six) hours as needed for severe pain. (Patient taking differently: Take 1 tablet by mouth every 4 (four) hours as needed for severe pain. ) 10 tablet 0  . insulin aspart (NOVOLOG) 100 UNIT/ML injection Inject 0-15 Units into the skin 3 (three) times daily with meals. 10 mL 11  . insulin glargine (LANTUS) 100 UNIT/ML injection Inject 34 Units into the skin at bedtime.    . isosorbide mononitrate (IMDUR) 60 MG 24 hr tablet Take 1 tablet (60 mg total) by mouth daily. 30 tablet 0  . LINZESS 290 MCG CAPS capsule TAKE 1 CAPSULE ONCE DAILY 30 MINUTES BEFORE MEALS (Patient taking differently: take 1 capsule by mouth 3 times a day. give 30 minutes prior to first meal) 90 capsule 3  . loperamide (IMODIUM) 2 MG capsule Take 2 mg by mouth daily as needed for constipation.    . metoprolol succinate (TOPROL-XL) 100 MG 24 hr tablet Take 1 tablet (100 mg total) by mouth 2 (two) times daily. Take with or immediately following a meal. 60 tablet 3  . Multiple Vitamin  (MULTIVITAMIN WITH MINERALS) TABS tablet Take 1 tablet by mouth daily.    . ondansetron (ZOFRAN) 4 MG tablet Take 1 tablet (4 mg total) by mouth every 8 (eight) hours as needed for nausea or vomiting. 30 tablet 1  . oxybutynin (DITROPAN) 5 MG tablet Take 1 tablet (5 mg total) by mouth 3 (three) times daily. 30 tablet 0  . pantoprazole (PROTONIX) 40 MG tablet Take 1 tablet (40 mg total) by mouth daily.    Marland Kitchen senna-docusate (SENOKOT-S) 8.6-50 MG tablet Take 1 tablet by mouth 2 (two) times daily.    Nelva Nay SOLOSTAR 300 UNIT/ML SOPN 15 Units every morning. Take 15- 25 units daily as needed    . vitamin C (ASCORBIC ACID) 500 MG tablet Take 500 mg by mouth 2 (two) times daily.    Marland Kitchen zinc sulfate 220 (50 Zn) MG capsule Take 220 mg by mouth daily. X 60 days  started 09/29/15    . zolpidem (AMBIEN) 10 MG tablet Take 10 mg by mouth at bedtime.     . furosemide (LASIX) 40 MG tablet Take 1 tablet (40 mg total) by mouth 2 (two) times daily. 30 tablet   . atorvastatin (LIPITOR) 20 MG tablet Take 20 mg by mouth daily.    . cloNIDine (CATAPRES) 0.1 MG tablet Take 1 tablet (0.1 mg total) by mouth 2 (two) times daily. 60 tablet 11  . colchicine 0.6 MG tablet Take 0.6 mg by mouth daily.    . collagenase (SANTYL) ointment Apply topically daily. 15 g 0  . feeding supplement, ENSURE ENLIVE, (ENSURE ENLIVE) LIQD Take 237 mLs by mouth 3 (three) times daily between meals. 237 mL 12  . gabapentin (NEURONTIN) 300 MG capsule Take 300 mg by mouth daily.    Marland Kitchen lanolin ointment Apply 1 application topically 2 (two) times daily as needed for dry skin.    Marland Kitchen omeprazole (PRILOSEC) 20 MG capsule Take 20 mg by mouth daily.     No facility-administered medications prior to visit.      Allergies:   Patient has no known allergies.   Social History   Social History  . Marital status: Married    Spouse name: N/A  . Number of children: 2  . Years of education: N/A   Occupational History  . retired    Social History Main Topics   . Smoking status: Former Smoker    Packs/day: 1.00    Years: 4.00    Types: Cigarettes    Quit date: 08/26/2010  . Smokeless tobacco: Never Used  . Alcohol use No     Comment: "stopped in 2012 when I quit smoking"  . Drug use: No  . Sexual activity: Not Currently   Other Topics Concern  . None   Social History Narrative  . None     Family History:  The patient's family history includes COPD in his father; Diabetes in his mother; Hypertension in his mother.   ROS:   Please see the history of present illness.    Review of Systems  Constitution: Negative.  HENT: Negative.   Cardiovascular: Positive for leg swelling.  Respiratory: Negative.   Endocrine: Negative.   Hematologic/Lymphatic: Negative.   Musculoskeletal: Positive for arthritis, joint swelling and stiffness.  Gastrointestinal: Negative.   Genitourinary: Negative.   Neurological: Negative.    All other systems reviewed and are negative.   PHYSICAL EXAM:   VS:  BP 130/76   Pulse 71   Ht 5\' 6"  (1.676 m)   Wt 227 lb (103 kg)   SpO2 98%   BMI 36.64 kg/m   Physical Exam  GEN: Well nourished, well developed, in no acute distress  Neck: no JVD, carotid bruits, or masses Cardiac:RRR; no murmurs, rubs, or gallops  Respiratory:  Decreased breath sounds but clear to auscultation bilaterally, normal work of breathing GI: soft, nontender, nondistended, + BS Ext: +3 edema bilaterally up to his knees, decreased distal pulses bilaterally MS: no deformity or atrophy  Skin: warm and dry, no rash Psych: euthymic mood, full affect  Wt Readings from Last 3 Encounters:  01/17/16 227 lb (103 kg)  10/26/15 205 lb (93 kg)  10/18/15 209 lb (94.8 kg)      Studies/Labs Reviewed:   EKG:  EKG is not ordered today.    Recent Labs: 08/14/2015: B Natriuretic Peptide 609.0; TSH 1.917 09/06/2015: ALT 17 10/18/2015: BUN 48; Creatinine, Ser 2.27; Hemoglobin 10.8; Magnesium 1.7; Platelets  330; Potassium 3.0; Sodium 133   Lipid  Panel    Component Value Date/Time   CHOL 79 08/15/2015 0422   TRIG 38 08/15/2015 0422   HDL 34 (L) 08/15/2015 0422   CHOLHDL 2.3 08/15/2015 0422   VLDL 8 08/15/2015 0422   LDLCALC 37 08/15/2015 0422    Additional studies/ records that were reviewed today include:  TEE 09/27/15 Impressions:   - Moderate Sedatoin: 17 mintues direct patient contact   5 mg versed 75 ug of fentanyl. LVE Diffuse hypokinesis EF 30%   Biatrial enlargement   No LAA thrombus   PFO present   Mild RVE   Normal AV   Mild MR     Patient on EP table and Dr Lovena Le to proceed with ablation of   flutter.  2-D echo 08/14/15 Study Conclusions   - Left ventricle: The cavity size was normal. Wall thickness was   increased in a pattern of mild LVH. The estimated ejection   fraction was 25%. Study telemetry shows a regular tachycardia   suggestive of atrial flutter. Diffuse hypokinesis. Probable   akinesis of the inferoseptal myocardium. The study is not   technically sufficient to allow evaluation of LV diastolic   function. - Aortic valve: Mildly calcified annulus. Trileaflet. - Mitral valve: There was trivial regurgitation. - Left atrium: The atrium was mildly dilated. - Right ventricle: Systolic function was moderately reduced. - Right atrium: The atrium was at the upper limits of normal in   size. Central venous pressure (est): 8 mm Hg. - Atrial septum: There was a patent foramen ovale. Bidirectional   shunting noted by color Doppler. - Tricuspid valve: There was mild regurgitation. - Pulmonary arteries: PA peak pressure: 31 mm Hg (S). - Pericardium, extracardiac: There was no pericardial effusion.   Impressions:   - Mild LVH with LVEF approximately 25%, diffuse hypokinesis with   probable akinesis of the inferior septum. Left ventricular   dysfunction is new in comparison to the previous study from 2015.   Study telemetry looks to show atrial flutter with RVR.   Indeterminate diastolic function.  Mild left atrial enlargement.   Trivial mitral regurgitation. Moderately reduced RV contraction.   Mild tricuspid regurgitation with PASP 31 mmHg. Probable secundum   PFO with bidirectional shunting noted.      ASSESSMENT:    1. Acute on chronic systolic congestive heart failure ([place])   2. Chronic renal failure, stage 2 (mild)   3. Essential hypertension   4. Typical atrial flutter ([facility])      PLAN:  In order of problems listed above:  Acute on chronic systolic heart failure: Patient mostly has lower extremity edema up approximately 20 pounds. He does not follow low sodium diet. Surprisingly his lungs are clear. Will increase Lasix to 80 mg twice a day. Check renal function today. 2 g sodium diet instructions given. Increase potassium to 10 mEq twice a day. Follow-up with me in 2 weeks.  CK D stage II to 3 we'll check creatinine today and watch closely on increased Lasix  Essential hypertension blood pressure stable today  History of atrial flutter and SVT status post ablation     Medication Adjustments/Labs and Tests Ordered: Current medicines are reviewed at length with the patient today.  Concerns regarding medicines are outlined above.  Medication changes, Labs and Tests ordered today are listed in the Patient Instructions below. Patient Instructions  Your physician recommends that you schedule a follow-up appointment in: 1-2 Weeks with Ermalinda Barrios, PA-C  Your physician recommends that you schedule a follow-up appointment in: 6 Weeks with Dr. Harl Bowie   Your physician has recommended you make the following change in your medication:   Increase Lasix to 80 mg Two Times Daily   Increase Potassium 10 mEq Two Times Daily   Your physician recommends that you have lab work done today.  If you need a refill on your cardiac medications before your next appointment, please call your pharmacy.  Thank you for choosing [facility]!          Sumner Boast, PA-C  01/17/2016 12:25 PM    [facility] [place], [place], [place]  [address] Phone: [phone]; Fax: [phone]

## 2016-01-17 ENCOUNTER — Ambulatory Visit (HOSPITAL_COMMUNITY)
Admission: RE | Admit: 2016-01-17 | Discharge: 2016-01-17 | Disposition: A | Discharge status: Home / Self Care | Payer: Medicare Other | Source: Ambulatory Visit | Attending: Pulmonary Disease | Admitting: Pulmonary Disease

## 2016-01-17 ENCOUNTER — Telehealth: Payer: Self-pay | Admitting: *Deleted

## 2016-01-17 ENCOUNTER — Ambulatory Visit (INDEPENDENT_AMBULATORY_CARE_PROVIDER_SITE_OTHER): Payer: Medicare Other | Admitting: Physician Assistant

## 2016-01-17 ENCOUNTER — Other Ambulatory Visit (HOSPITAL_COMMUNITY)
Admission: RE | Admit: 2016-01-17 | Discharge: 2016-01-17 | Disposition: A | Discharge status: Home / Self Care | Payer: Medicare Other | Source: Ambulatory Visit | Attending: Physician Assistant | Admitting: Physician Assistant

## 2016-01-17 ENCOUNTER — Encounter: Payer: Self-pay | Admitting: Physician Assistant

## 2016-01-17 ENCOUNTER — Other Ambulatory Visit (HOSPITAL_COMMUNITY): Payer: Self-pay | Admitting: Pulmonary Disease

## 2016-01-17 VITALS — BP 130/76 | HR 71 | Ht 66.0 in | Wt 227.0 lb

## 2016-01-17 DIAGNOSIS — N179 Acute kidney failure, unspecified: Secondary | ICD-10-CM | POA: Insufficient documentation

## 2016-01-17 DIAGNOSIS — I5023 Acute on chronic systolic (congestive) heart failure: Secondary | ICD-10-CM | POA: Diagnosis not present

## 2016-01-17 DIAGNOSIS — I10 Essential (primary) hypertension: Secondary | ICD-10-CM

## 2016-01-17 DIAGNOSIS — M79672 Pain in left foot: Secondary | ICD-10-CM | POA: Insufficient documentation

## 2016-01-17 DIAGNOSIS — N182 Chronic kidney disease, stage 2 (mild): Secondary | ICD-10-CM

## 2016-01-17 DIAGNOSIS — I483 Typical atrial flutter: Secondary | ICD-10-CM | POA: Diagnosis not present

## 2016-01-17 DIAGNOSIS — M19072 Primary osteoarthritis, left ankle and foot: Secondary | ICD-10-CM | POA: Diagnosis not present

## 2016-01-17 LAB — BASIC METABOLIC PANEL
Anion gap: 7 (ref 5–15)
BUN: 24 mg/dL — ABNORMAL HIGH (ref 6–20)
CO2: 25 mmol/L (ref 22–32)
Calcium: 9.1 mg/dL (ref 8.9–10.3)
Chloride: 102 mmol/L (ref 101–111)
Creatinine, Ser: 2.2 mg/dL — ABNORMAL HIGH (ref 0.61–1.24)
GFR calc Af Amer: 35 mL/min — ABNORMAL LOW (ref >60)
GFR calc non Af Amer: 30 mL/min — ABNORMAL LOW (ref >60)
Glucose, Bld: 193 mg/dL — ABNORMAL HIGH (ref 65–99)
Potassium: 3.9 mmol/L (ref 3.5–5.1)
Sodium: 134 mmol/L — ABNORMAL LOW (ref 135–145)

## 2016-01-17 MED ORDER — POTASSIUM CHLORIDE ER 10 MEQ PO TBCR: 10.0000 meq | EXTENDED_RELEASE_TABLET | Freq: Every day | ORAL | 3 refills | Status: DC

## 2016-01-17 MED ORDER — FUROSEMIDE 80 MG PO TABS: 80.0000 mg | ORAL_TABLET | Freq: Two times a day (BID) | ORAL | 3 refills | Status: DC

## 2016-01-17 NOTE — Patient Instructions (Signed)
Your physician recommends that you schedule a follow-up appointment in: 1-2 Weeks with Ermalinda Barrios, PA-C  Your physician recommends that you schedule a follow-up appointment in: 6 Weeks with Dr. Harl Bowie   Your physician has recommended you make the following change in your medication:   Increase Lasix to 80 mg Two Times Daily   Increase Potassium 10 mEq Two Times Daily   Your physician recommends that you have lab work done today.  If you need a refill on your cardiac medications before your next appointment, please call your pharmacy.  Thank you for choosing [facility]!

## 2016-01-17 NOTE — Telephone Encounter (Signed)
-----   Message from Imogene Burn, PA-C sent at 01/17/2016  3:00 PM EST ----- Glucose high at 193. Kidney function stable at 2.2. Continue with changes made at office visit.

## 2016-01-17 NOTE — Telephone Encounter (Signed)
Called patient with test results. No answer. Left message to call back.  

## 2016-01-30 ENCOUNTER — Telehealth: Payer: Self-pay | Admitting: Physician Assistant

## 2016-01-30 NOTE — Telephone Encounter (Signed)
Wants to know what patient can have on low sodium diet. / tg

## 2016-01-30 NOTE — Telephone Encounter (Signed)
Will mail a copy of our low sodium diet to pt. Left message for pt on voicemail.

## 2016-01-31 ENCOUNTER — Ambulatory Visit (INDEPENDENT_AMBULATORY_CARE_PROVIDER_SITE_OTHER): Payer: Medicare Other | Admitting: Physician Assistant

## 2016-01-31 ENCOUNTER — Other Ambulatory Visit (HOSPITAL_COMMUNITY)
Admission: RE | Admit: 2016-01-31 | Discharge: 2016-01-31 | Disposition: A | Discharge status: Home / Self Care | Payer: Medicare Other | Source: Ambulatory Visit | Attending: Physician Assistant | Admitting: Physician Assistant

## 2016-01-31 ENCOUNTER — Encounter: Payer: Self-pay | Admitting: Physician Assistant

## 2016-01-31 ENCOUNTER — Telehealth: Payer: Self-pay | Admitting: *Deleted

## 2016-01-31 VITALS — BP 106/68 | HR 61 | Ht 66.0 in | Wt 213.0 lb

## 2016-01-31 DIAGNOSIS — I5022 Chronic systolic (congestive) heart failure: Secondary | ICD-10-CM

## 2016-01-31 DIAGNOSIS — I10 Essential (primary) hypertension: Secondary | ICD-10-CM | POA: Diagnosis not present

## 2016-01-31 DIAGNOSIS — Z79899 Other long term (current) drug therapy: Secondary | ICD-10-CM

## 2016-01-31 DIAGNOSIS — N183 Chronic kidney disease, stage 3 unspecified: Secondary | ICD-10-CM

## 2016-01-31 DIAGNOSIS — I483 Typical atrial flutter: Secondary | ICD-10-CM

## 2016-01-31 LAB — BASIC METABOLIC PANEL
Anion gap: 10 (ref 5–15)
BUN: 30 mg/dL — ABNORMAL HIGH (ref 6–20)
CO2: 24 mmol/L (ref 22–32)
Calcium: 9.9 mg/dL (ref 8.9–10.3)
Chloride: 101 mmol/L (ref 101–111)
Creatinine, Ser: 2.61 mg/dL — ABNORMAL HIGH (ref 0.61–1.24)
GFR calc Af Amer: 28 mL/min — ABNORMAL LOW (ref >60)
GFR calc non Af Amer: 24 mL/min — ABNORMAL LOW (ref >60)
Glucose, Bld: 84 mg/dL (ref 65–99)
Potassium: 3.3 mmol/L — ABNORMAL LOW (ref 3.5–5.1)
Sodium: 135 mmol/L (ref 135–145)

## 2016-01-31 MED ORDER — POTASSIUM CHLORIDE ER 10 MEQ PO TBCR
10.0000 meq | EXTENDED_RELEASE_TABLET | Freq: Every day | ORAL | 3 refills | Status: DC
Start: 2016-01-31 — End: 2016-04-18

## 2016-01-31 MED ORDER — FUROSEMIDE 40 MG PO TABS: 40.0000 mg | ORAL_TABLET | Freq: Two times a day (BID) | ORAL | 3 refills | Status: DC

## 2016-01-31 NOTE — Patient Instructions (Addendum)
Your physician recommends that you schedule a follow-up appointment in: 6 Weeks with Dr. Harl Bowie  Your physician has recommended you make the following change in your medication:  Decrease Lasix to 40 mg Two Times Daily  Decrease Potassium to 10 mEq Daily   Your physician recommends that you have  lab work done today.   Call the office for a weight gain of 2-3 pounds overnight  If you need a refill on your cardiac medications before your next appointment, please call your pharmacy.  Thank you for choosing [facility]!

## 2016-01-31 NOTE — Telephone Encounter (Signed)
-----   Message from Imogene Burn, PA-C sent at 01/31/2016  3:19 PM EST ----- Karen Kays Can you take care of this. I accidentally sent it to [facility]. Thanks, Selinda Eon ----- Message ----- From: Imogene Burn, PA-C Sent: 01/31/2016   3:17 PM To: Sinda Du, MD, Jeanann Lewandowsky, RMA  Potassium low. Take extra potassium 10 mEq 3 tablets today, 2 tablets tomorrow then once daily. Recheck bmet next week. Kidney function also up but have decreased his lasix.

## 2016-01-31 NOTE — Progress Notes (Signed)
Cardiology Office Note    Date:  01/31/2016   ID:  Frank Hoover, DOB 1950/08/04, MRN [id number]  PCP:  Alonza Bogus, MD  Cardiologist: Dr. Harl Bowie EPS: Dr. Lovena Le  Chief Complaint  Patient presents with  . Follow-up    History of Present Illness:  Frank Hoover is a 65 y.o. male  with multiple medical problems who has had both atrial flutter and SVT (AVNRT). He underwent EP study and catheter ablation of both arrhythmias  Dr. Lovena Le stopped his Amiodarone and Eliquis 10/26/15. Also has chronic systolic CHF EF 40% diffuse HK, moderate RV dysfunction echo 08/2015. No ischemic eval b/c of poor renal function. Crt 2.27 10/2015 1.26 in 12/2015.CKD stage 2-3.  I saw the patient 01/17/16 on referral from Dr.Befekadu for CHF. His Lasix had been increased to 40 mg twice a day in October. His weight went from 205 pounds to 227 pounds since August. He had significant lower extremity edema. He was not following a low-sodium diet and eating frozen dinners daily.  I increased his Lasix to 80 mg twice a day. Creatinine was 2.20 that day which was slightly better than 2.27 in August.  Patient comes in today feeling much better. His weight is down to 213 pounds. His daughter says he stopped eating frozen foods and feels a lot better. His swelling has improved. No more dyspnea on exertion.       Past Medical History:  Diagnosis Date  . Adenomatous colon polyp 10/30/09   Serrated adenoma removed during Colonoscopy   . Arthritis    "legs" (09/27/2015)  . Atrial flutter with rapid ventricular response ([place]) 09/27/2015  . CKD (chronic kidney disease) stage 3, GFR 30-59 ml/min   . COPD (chronic obstructive pulmonary disease) ([place])   . Diverticulosis of colon   . Dysrhythmia   . Essential hypertension   . Gastroparesis   . GERD (gastroesophageal reflux disease)   . Gout   . Hyperlipemia   . Hypertension   . OA (osteoarthritis)   . Type 2 diabetes mellitus ([place])     Past Surgical  History:  Procedure Laterality Date  . CARDIOVERSION N/A 08/22/2015   Procedure: CARDIOVERSION;  Surgeon: Larey Dresser, MD;  Location: [facility];  Service: Cardiovascular;  Laterality: N/A;  . CARDIOVERSION N/A 09/01/2015   Procedure: CARDIOVERSION;  Surgeon: Lelon Perla, MD;  Location: [facility];  Service: Cardiovascular;  Laterality: N/A;  . CATARACT EXTRACTION W/ INTRAOCULAR LENS  IMPLANT, BILATERAL Bilateral   . COLONOSCOPY  10/30/09   JWJ:[id number]-NWGNF diverticulum/serrated adenoma from ICV, next colonoscopy due 10/2012  . COLONOSCOPY N/A 09/18/2012   AOZ:HYQMVHQ mucosa that was seen appeared normal, however most of it was not seen due to be very poor prep  . COLONOSCOPY N/A 04/08/2013   ION:GEXBMWU polyp removed/inadequate preparation  . COLONOSCOPY N/A 06/22/2014   Procedure: COLONOSCOPY;  Surgeon: Daneil Dolin, MD;  [facility];  Service: Endoscopy;  Laterality: N/A;  115   . ELECTROPHYSIOLOGIC STUDY N/A 09/27/2015   Procedure: SVT Ablation;  Surgeon: Evans Lance, MD;  Location: [facility];  Service: Cardiovascular;  Laterality: N/A;  . ESOPHAGOGASTRODUODENOSCOPY  10/30/09   XLK:GMWNUU   . FLEXIBLE SIGMOIDOSCOPY N/A 08/25/2015   Procedure: FLEXIBLE SIGMOIDOSCOPY;  Surgeon: Jerene Bears, MD;  Location: [facility] END[facility];  Service: Endoscopy;  Laterality: N/A;  . TEE WITHOUT CARDIOVERSION N/A 08/22/2015   Procedure: TRANSESOPHAGEAL ECHOCARDIOGRAM (TEE);  Surgeon: Larey Dresser, MD;  Location: [facility];  Service:  Cardiovascular;  Laterality: N/A;  . TEE WITHOUT CARDIOVERSION  09/27/2015   Procedure: Transesophageal Echocardiogram (Tee);  Surgeon: Evans Lance, MD;  Location: [facility];  Service: Cardiovascular;;  . TONSILLECTOMY  1950s/1960s    Current Medications: Outpatient Medications Prior to Visit  Medication Sig Dispense Refill  . acetaminophen (TYLENOL) 500 MG tablet Take 1 tablet (500 mg total) by mouth every 6 (six) hours as needed for  moderate pain. 30 tablet 0  . albuterol (PROAIR HFA) 108 (90 BASE) MCG/ACT inhaler Inhale 2 puffs into the lungs every 6 (six) hours as needed for wheezing or shortness of breath.     . allopurinol (ZYLOPRIM) 300 MG tablet Take 300 mg by mouth daily.      . Amino Acids-Protein Hydrolys (FEEDING SUPPLEMENT, PRO-STAT SUGAR FREE 64,) LIQD Take 30 mLs by mouth 3 (three) times daily between meals. 900 mL 0  . amLODipine (NORVASC) 5 MG tablet Take 5 mg by mouth daily.    . Cholecalciferol (VITAMIN D PO) Take 1,000 Units by mouth daily.    Marland Kitchen gabapentin (NEURONTIN) 400 MG capsule Take 400 mg by mouth 2 (two) times daily. Takes at noon & bedtime    . glimepiride (AMARYL) 2 MG tablet Take 2 mg by mouth daily.    Marland Kitchen HUMALOG KWIKPEN 100 UNIT/ML KiwkPen Inject 0-15 Units into the skin See admin instructions. Per sliding scale    . hydrALAZINE (APRESOLINE) 25 MG tablet Take 3 tablets (75 mg total) by mouth every 8 (eight) hours. (Patient taking differently: Take 75 mg by mouth 3 (three) times daily. 75 mg q 8 hrs)    . HYDROcodone-acetaminophen (NORCO/VICODIN) 5-325 MG tablet Take 1 tablet by mouth every 6 (six) hours as needed for severe pain. (Patient taking differently: Take 1 tablet by mouth every 4 (four) hours as needed for severe pain. ) 10 tablet 0  . insulin aspart (NOVOLOG) 100 UNIT/ML injection Inject 0-15 Units into the skin 3 (three) times daily with meals. 10 mL 11  . insulin glargine (LANTUS) 100 UNIT/ML injection Inject 34 Units into the skin at bedtime.    . isosorbide mononitrate (IMDUR) 60 MG 24 hr tablet Take 1 tablet (60 mg total) by mouth daily. 30 tablet 0  . LINZESS 290 MCG CAPS capsule TAKE 1 CAPSULE ONCE DAILY 30 MINUTES BEFORE MEALS (Patient taking differently: take 1 capsule by mouth 3 times a day. give 30 minutes prior to first meal) 90 capsule 3  . loperamide (IMODIUM) 2 MG capsule Take 2 mg by mouth daily as needed for constipation.    . metoprolol succinate (TOPROL-XL) 100 MG 24 hr  tablet Take 1 tablet (100 mg total) by mouth 2 (two) times daily. Take with or immediately following a meal. 60 tablet 3  . Multiple Vitamin (MULTIVITAMIN WITH MINERALS) TABS tablet Take 1 tablet by mouth daily.    . ondansetron (ZOFRAN) 4 MG tablet Take 1 tablet (4 mg total) by mouth every 8 (eight) hours as needed for nausea or vomiting. 30 tablet 1  . oxybutynin (DITROPAN) 5 MG tablet Take 1 tablet (5 mg total) by mouth 3 (three) times daily. 30 tablet 0  . pantoprazole (PROTONIX) 40 MG tablet Take 1 tablet (40 mg total) by mouth daily.    . potassium chloride (K-DUR,KLOR-CON) 10 MEQ tablet Take 10 mEq by mouth daily.    Marland Kitchen senna-docusate (SENOKOT-S) 8.6-50 MG tablet Take 1 tablet by mouth 2 (two) times daily.    . tamsulosin (FLOMAX) 0.4 MG  CAPS capsule Take 0.4 mg by mouth.    Nelva Nay SOLOSTAR 300 UNIT/ML SOPN 15 Units every morning. Take 15- 25 units daily as needed    . vitamin C (ASCORBIC ACID) 500 MG tablet Take 500 mg by mouth 2 (two) times daily.    Marland Kitchen zinc sulfate 220 (50 Zn) MG capsule Take 220 mg by mouth daily. X 60 days started 09/29/15    . zolpidem (AMBIEN) 10 MG tablet Take 10 mg by mouth at bedtime.     . furosemide (LASIX) 80 MG tablet Take 1 tablet (80 mg total) by mouth 2 (two) times daily. 180 tablet 3  . potassium chloride (K-DUR) 10 MEQ tablet Take 1 tablet (10 mEq total) by mouth daily. 180 tablet 3   No facility-administered medications prior to visit.      Allergies:   Patient has no known allergies.   Social History   Social History  . Marital status: Married    Spouse name: N/A  . Number of children: 2  . Years of education: N/A   Occupational History  . retired    Social History Main Topics  . Smoking status: Former Smoker    Packs/day: 1.00    Years: 4.00    Types: Cigarettes    Quit date: 08/26/2010  . Smokeless tobacco: Never Used  . Alcohol use No     Comment: "stopped in 2012 when I quit smoking"  . Drug use: No  . Sexual activity: Not  Currently   Other Topics Concern  . None   Social History Narrative  . None     Family History:  The patient's family history includes COPD in his father; Diabetes in his mother; Hypertension in his mother.   ROS:   Please see the history of present illness.    Review of Systems  Constitution: Positive for weakness and weight loss.  HENT: Negative.   Respiratory: Negative.   Endocrine: Negative.   Hematologic/Lymphatic: Negative.   Musculoskeletal: Positive for arthritis, muscle weakness and myalgias.  Gastrointestinal: Negative.   Genitourinary: Negative.    All other systems reviewed and are negative.   PHYSICAL EXAM:   VS:  BP 106/68   Pulse 61   Ht 5' 6"  (1.676 m)   Wt 213 lb (96.6 kg)   SpO2 97%   BMI 34.38 kg/m   Physical Exam  GEN: Obese, in no acute distress Neck: no JVD, carotid bruits, or masses Cardiac:RRR; no murmurs, rubs, or gallops  Respiratory:  clear to auscultation bilaterally, normal work of breathing GI: soft, nontender, nondistended, + BS Ext: Wearing TED hose without cyanosis, clubbing, or edema, Good distal pulses bilaterally MS: no deformity or atrophy Skin: warm and dry, no rash Psych: euthymic mood, full affect  Wt Readings from Last 3 Encounters:  01/31/16 213 lb (96.6 kg)  01/17/16 227 lb (103 kg)  10/26/15 205 lb (93 kg)      Studies/Labs Reviewed:   EKG:  EKG is not ordered today.    Recent Labs: 08/14/2015: B Natriuretic Peptide 609.0; TSH 1.917 09/06/2015: ALT 17 10/18/2015: Hemoglobin 10.8; Magnesium 1.7; Platelets 330 01/17/2016: BUN 24; Creatinine, Ser 2.20; Potassium 3.9; Sodium 134   Lipid Panel    Component Value Date/Time   CHOL 79 08/15/2015 0422   TRIG 38 08/15/2015 0422   HDL 34 (L) 08/15/2015 0422   CHOLHDL 2.3 08/15/2015 0422   VLDL 8 08/15/2015 0422   LDLCALC 37 08/15/2015 0422    Additional studies/ records that  were reviewed today include:  TEE 09/27/15 Impressions:   - Moderate Sedatoin: 17 mintues  direct patient contact   5 mg versed 75 ug of fentanyl. LVE Diffuse hypokinesis EF 30%   Biatrial enlargement   No LAA thrombus   PFO present   Mild RVE   Normal AV   Mild MR     Patient on EP table and Dr Lovena Le to proceed with ablation of   flutter.  2-D echo 08/14/15 Study Conclusions   - Left ventricle: The cavity size was normal. Wall thickness was   increased in a pattern of mild LVH. The estimated ejection   fraction was 25%. Study telemetry shows a regular tachycardia   suggestive of atrial flutter. Diffuse hypokinesis. Probable   akinesis of the inferoseptal myocardium. The study is not   technically sufficient to allow evaluation of LV diastolic   function. - Aortic valve: Mildly calcified annulus. Trileaflet. - Mitral valve: There was trivial regurgitation. - Left atrium: The atrium was mildly dilated. - Right ventricle: Systolic function was moderately reduced. - Right atrium: The atrium was at the upper limits of normal in   size. Central venous pressure (est): 8 mm Hg. - Atrial septum: There was a patent foramen ovale. Bidirectional   shunting noted by color Doppler. - Tricuspid valve: There was mild regurgitation. - Pulmonary arteries: PA peak pressure: 31 mm Hg (S). - Pericardium, extracardiac: There was no pericardial effusion.   Impressions:   - Mild LVH with LVEF approximately 25%, diffuse hypokinesis with   probable akinesis of the inferior septum. Left ventricular   dysfunction is new in comparison to the previous study from 2015.   Study telemetry looks to show atrial flutter with RVR.   Indeterminate diastolic function. Mild left atrial enlargement.   Trivial mitral regurgitation. Moderately reduced RV contraction.   Mild tricuspid regurgitation with PASP 31 mmHg. Probable secundum   PFO with bidirectional shunting noted.           ASSESSMENT:    1. Drug therapy   2. Chronic systolic heart failure ([place])   3. Essential hypertension   4.  Chronic renal failure, stage 3 (moderate)   5. Typical atrial flutter ([facility])      PLAN:  In order of problems listed above:  Drug therapy patient diuresed well on increased Lasix. We'll decrease to 40 mg twice a day and decrease potassium to 10 daily. Check renal function today.  Chronic systolic heart failure with excellent response from increase Lasix. Weight down 14 pounds. No more edema. Breathing improved. We'll decrease Lasix to 40 mg twice a day. Continue low sodium diet. Check renal function today. Follow-up with Dr. branch in 6 weeks. Call if weight gain of 2 or 3 pounds overnight and I'm happy to see back. Essential hypertension blood pressure on the low side today  Chronic renal failure stage III renal function was stable when checked last. We'll repeat today.  Typical atrial flutter and SVT status post ablation.       Medication Adjustments/Labs and Tests Ordered: Current medicines are reviewed at length with the patient today.  Concerns regarding medicines are outlined above.  Medication changes, Labs and Tests ordered today are listed in the Patient Instructions below. Patient Instructions  Your physician recommends that you schedule a follow-up appointment in: 6 Weeks with Dr. Harl Bowie  Your physician has recommended you make the following change in your medication:  Decrease Lasix to 40 mg Two Times Daily  Decrease Potassium to  10 mEq Daily   Your physician recommends that you have  lab work done today.   Call the office for a weight gain of 2-3 pounds overnight  If you need a refill on your cardiac medications before your next appointment, please call your pharmacy.  Thank you for choosing [facility]!       Sumner Boast, PA-C  01/31/2016 12:09 PM    [facility] [address], [place], [place]  [address] Phone: [phone]; Fax: [phone]

## 2016-03-06 DIAGNOSIS — C184 Malignant neoplasm of transverse colon: Secondary | ICD-10-CM | POA: Diagnosis not present

## 2016-03-06 DIAGNOSIS — D649 Anemia, unspecified: Secondary | ICD-10-CM | POA: Diagnosis not present

## 2016-03-06 DIAGNOSIS — J449 Chronic obstructive pulmonary disease, unspecified: Secondary | ICD-10-CM | POA: Diagnosis not present

## 2016-03-06 DIAGNOSIS — I10 Essential (primary) hypertension: Secondary | ICD-10-CM | POA: Diagnosis not present

## 2016-03-19 ENCOUNTER — Encounter: Payer: Self-pay | Admitting: Cardiology

## 2016-03-19 ENCOUNTER — Ambulatory Visit (INDEPENDENT_AMBULATORY_CARE_PROVIDER_SITE_OTHER): Payer: Medicare Other | Admitting: Cardiology

## 2016-03-19 VITALS — BP 116/70 | HR 69 | Ht 66.0 in | Wt 234.0 lb

## 2016-03-19 DIAGNOSIS — I10 Essential (primary) hypertension: Secondary | ICD-10-CM | POA: Diagnosis not present

## 2016-03-19 DIAGNOSIS — I5022 Chronic systolic (congestive) heart failure: Secondary | ICD-10-CM

## 2016-03-19 DIAGNOSIS — I4892 Unspecified atrial flutter: Secondary | ICD-10-CM | POA: Diagnosis not present

## 2016-03-19 NOTE — Patient Instructions (Signed)
Medication Instructions:  Your physician recommends that you continue on your current medications as directed. Please refer to the Current Medication list given to you today.   Labwork: none  Testing/Procedures: Your physician has requested that you have an echocardiogram. Echocardiography is a painless test that uses sound waves to create images of your heart. It provides your doctor with information about the size and shape of your heart and how well your heart's chambers and valves are working. This procedure takes approximately one hour. There are no restrictions for this procedure.    Follow-Up: Your physician recommends that you schedule a follow-up appointment in: 2 months    Any Other Special Instructions Will Be Listed Below (If Applicable).     If you need a refill on your cardiac medications before your next appointment, please call your pharmacy.   

## 2016-03-19 NOTE — Progress Notes (Signed)
Clinical Summary Frank Hoover is a 66 y.o.male seen today for follow up of the following medical problems.   1. 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     - No recent palpiations - compliant with meds    2. Chronic systolic HF - echo 08/3014 LVEF 25%, diffuse hypokinesis. Mod RV dysfunction.  - new diagnosis 08/2015 during admission with sepsis - thought possible tachcyardia mediated, however he has not had an ischemic evaluation  -. No ACE/entresto/aldactone due to CKD. Though family reports nephrology recently started lisionpril.  - no recent edema. Wearing compression stockings, Limiting sodium intake.  - home weights inaccurate per report, he is looking to buy a new scale  3, HTN - compliant with meds  Past Medical History:  Diagnosis Date  . Adenomatous colon polyp 10/30/09   Serrated adenoma removed during Colonoscopy   . Arthritis    "legs" (09/27/2015)  . Atrial flutter with rapid ventricular response ([place]) 09/27/2015  . CKD (chronic kidney disease) stage 3, GFR 30-59 ml/min   . COPD (chronic obstructive pulmonary disease) ([place])   . Diverticulosis of colon   . Dysrhythmia   . Essential hypertension   . Gastroparesis   . GERD (gastroesophageal reflux disease)   . Gout   . Hyperlipemia   . Hypertension   . OA (osteoarthritis)   . Type 2 diabetes mellitus (HCC)      No Known Allergies   Current Outpatient Prescriptions  Medication Sig Dispense Refill  . acetaminophen (TYLENOL) 500 MG tablet Take 1 tablet (500 mg total) by mouth every 6 (six) hours as needed for moderate pain. 30 tablet 0  . albuterol (PROAIR HFA) 108 (90 BASE) MCG/ACT inhaler Inhale 2 puffs into the lungs every 6 (six) hours as needed for wheezing or shortness of breath.     . allopurinol (ZYLOPRIM) 300 MG tablet Take 300 mg by  mouth daily.      . Amino Acids-Protein Hydrolys (FEEDING SUPPLEMENT, PRO-STAT SUGAR FREE 64,) LIQD Take 30 mLs by mouth 3 (three) times daily between meals. 900 mL 0  . amLODipine (NORVASC) 5 MG tablet Take 5 mg by mouth daily.    . Cholecalciferol (VITAMIN D PO) Take 1,000 Units by mouth daily.    . furosemide (LASIX) 40 MG tablet Take 1 tablet (40 mg total) by mouth 2 (two) times daily. 180 tablet 3  . gabapentin (NEURONTIN) 400 MG capsule Take 400 mg by mouth 2 (two) times daily. Takes at noon & bedtime    . glimepiride (AMARYL) 2 MG tablet Take 2 mg by mouth daily.    Marland Kitchen HUMALOG KWIKPEN 100 UNIT/ML KiwkPen Inject 0-15 Units into the skin See admin instructions. Per sliding scale    . hydrALAZINE (APRESOLINE) 25 MG tablet Take 3 tablets (75 mg total) by mouth every 8 (eight) hours. (Patient taking differently: Take 75 mg by mouth 3 (three) times daily. 75 mg q 8 hrs)    . HYDROcodone-acetaminophen (NORCO/VICODIN) 5-325 MG tablet Take 1 tablet by mouth every 6 (six) hours as needed for severe pain. (Patient taking differently: Take 1 tablet by mouth every 4 (four) hours as needed for severe pain. ) 10 tablet 0  . insulin aspart (NOVOLOG) 100 UNIT/ML injection Inject 0-15 Units into the skin 3 (three) times daily with meals. 10 mL 11  . insulin glargine (LANTUS)  100 UNIT/ML injection Inject 34 Units into the skin at bedtime.    . isosorbide mononitrate (IMDUR) 60 MG 24 hr tablet Take 1 tablet (60 mg total) by mouth daily. 30 tablet 0  . LINZESS 290 MCG CAPS capsule TAKE 1 CAPSULE ONCE DAILY 30 MINUTES BEFORE MEALS (Patient taking differently: take 1 capsule by mouth 3 times a day. give 30 minutes prior to first meal) 90 capsule 3  . loperamide (IMODIUM) 2 MG capsule Take 2 mg by mouth daily as needed for constipation.    . metoprolol succinate (TOPROL-XL) 100 MG 24 hr tablet Take 1 tablet (100 mg total) by mouth 2 (two) times daily. Take with or immediately following a meal. 60 tablet 3  . Multiple  Vitamin (MULTIVITAMIN WITH MINERALS) TABS tablet Take 1 tablet by mouth daily.    . ondansetron (ZOFRAN) 4 MG tablet Take 1 tablet (4 mg total) by mouth every 8 (eight) hours as needed for nausea or vomiting. 30 tablet 1  . oxybutynin (DITROPAN) 5 MG tablet Take 1 tablet (5 mg total) by mouth 3 (three) times daily. 30 tablet 0  . pantoprazole (PROTONIX) 40 MG tablet Take 1 tablet (40 mg total) by mouth daily.    . potassium chloride (K-DUR) 10 MEQ tablet Take 1 tablet (10 mEq total) by mouth daily. 90 tablet 3  . potassium chloride (K-DUR,KLOR-CON) 10 MEQ tablet Take 10 mEq by mouth daily.    Marland Kitchen senna-docusate (SENOKOT-S) 8.6-50 MG tablet Take 1 tablet by mouth 2 (two) times daily.    . tamsulosin (FLOMAX) 0.4 MG CAPS capsule Take 0.4 mg by mouth.    Nelva Nay SOLOSTAR 300 UNIT/ML SOPN 15 Units every morning. Take 15- 25 units daily as needed    . vitamin C (ASCORBIC ACID) 500 MG tablet Take 500 mg by mouth 2 (two) times daily.    Marland Kitchen zinc sulfate 220 (50 Zn) MG capsule Take 220 mg by mouth daily. X 60 days started 09/29/15    . zolpidem (AMBIEN) 10 MG tablet Take 10 mg by mouth at bedtime.      No current facility-administered medications for this visit.      Past Surgical History:  Procedure Laterality Date  . CARDIOVERSION N/A 08/22/2015   Procedure: CARDIOVERSION;  Surgeon: Larey Dresser, MD;  Location: [place];  Service: Cardiovascular;  Laterality: N/A;  . CARDIOVERSION N/A 09/01/2015   Procedure: CARDIOVERSION;  Surgeon: Lelon Perla, MD;  Location: [place];  Service: Cardiovascular;  Laterality: N/A;  . CATARACT EXTRACTION W/ INTRAOCULAR LENS  IMPLANT, BILATERAL Bilateral   . COLONOSCOPY  10/30/09   YSA:[id number] diverticulum/serrated adenoma from ICV, next colonoscopy due 10/2012  . COLONOSCOPY N/A 09/18/2012   NAT:FTDDUKG mucosa that was seen appeared normal, however most of it was not seen due to be very poor prep  . COLONOSCOPY N/A 04/08/2013   URK:YHCWCBJ polyp  removed/inadequate preparation  . COLONOSCOPY N/A 06/22/2014   Procedure: COLONOSCOPY;  Surgeon: Daneil Dolin, MD;  [facility];  Service: Endoscopy;  Laterality: N/A;  115   . ELECTROPHYSIOLOGIC STUDY N/A 09/27/2015   Procedure: SVT Ablation;  Surgeon: Evans Lance, MD;  Location: [place] [facility];  Service: Cardiovascular;  Laterality: N/A;  . ESOPHAGOGASTRODUODENOSCOPY  10/30/09   SEG:[id number]   . FLEXIBLE SIGMOIDOSCOPY N/A 08/25/2015   Procedure: FLEXIBLE SIGMOIDOSCOPY;  Surgeon: Jerene Bears, MD;  Location: [facility] ENDOSCOPY;  Service: Endoscopy;  Laterality: N/A;  . TEE WITHOUT CARDIOVERSION N/A 08/22/2015   Procedure:  TRANSESOPHAGEAL ECHOCARDIOGRAM (TEE);  Surgeon: Larey Dresser, MD;  Location: [place];  Service: Cardiovascular;  Laterality: N/A;  . TEE WITHOUT CARDIOVERSION  09/27/2015   Procedure: Transesophageal Echocardiogram (Tee);  Surgeon: Evans Lance, MD;  Location: [place] [facility];  Service: Cardiovascular;;  . TONSILLECTOMY  1950s/1960s     No Known Allergies    Family History  Problem Relation Age of Onset  . COPD Father   . Diabetes Mother   . Hypertension Mother   . Colon cancer Neg Hx      Social History Mr. Sudano reports that he quit smoking about 5 years ago. His smoking use included Cigarettes. He has a 4.00 pack-year smoking history. He has never used smokeless tobacco. Mr. Hagmann reports that he does not drink alcohol.   Review of Systems CONSTITUTIONAL: No weight loss, fever, chills, weakness or fatigue.  HEENT: Eyes: No visual loss, blurred vision, double vision or yellow sclerae.No hearing loss, sneezing, congestion, runny nose or sore throat.  SKIN: No rash or itching.  CARDIOVASCULAR: per HPI RESPIRATORY: No shortness of breath, cough or sputum.  GASTROINTESTINAL: No anorexia, nausea, vomiting or diarrhea. No abdominal pain or blood.  GENITOURINARY: No burning on urination, no polyuria NEUROLOGICAL: No headache, dizziness,  syncope, paralysis, ataxia, numbness or tingling in the extremities. No change in bowel or bladder control.  MUSCULOSKELETAL: No muscle, back pain, joint pain or stiffness.  LYMPHATICS: No enlarged nodes. No history of splenectomy.  PSYCHIATRIC: No history of depression or anxiety.  ENDOCRINOLOGIC: No reports of sweating, cold or heat intolerance. No polyuria or polydipsia.  Marland Kitchen   Physical Examination Vitals:   03/19/16 1347  BP: 116/70  Pulse: 69   Vitals:   03/19/16 1347  Weight: 234 lb (106.1 kg)  Height: 5' 6"  (1.676 m)    Gen: resting comfortably, no acute distress HEENT: no scleral icterus, pupils equal round and reactive, no palptable cervical adenopathy,  CV: RRR, no m/r/g, no jvd Resp: Clear to auscultation bilaterally GI: abdomen is soft, non-tender, non-distended, normal bowel sounds, no hepatosplenomegaly MSK: extremities are warm, no edema.  Skin: warm, no rash Neuro:  no focal deficits Psych: appropriate affect    Assessment and Plan   1. Aflutter  - s/p ablation, followed by EP.  - no current symptoms.   2. Chronic systolic HF - medical therapy limited by CKD - we will repeat echo, pending results may need considration for ICD. Renal function continues to prohibit cath.   3. HTN - at goal, continue current meds   Request labs from nephrologist. F/u 2 months     Arnoldo Lenis, M.D.

## 2016-03-25 ENCOUNTER — Ambulatory Visit (HOSPITAL_COMMUNITY): Payer: Medicare Other

## 2016-03-26 DIAGNOSIS — G4733 Obstructive sleep apnea (adult) (pediatric): Secondary | ICD-10-CM | POA: Diagnosis not present

## 2016-03-26 DIAGNOSIS — R3914 Feeling of incomplete bladder emptying: Secondary | ICD-10-CM | POA: Diagnosis not present

## 2016-03-26 DIAGNOSIS — N189 Chronic kidney disease, unspecified: Secondary | ICD-10-CM | POA: Diagnosis not present

## 2016-03-26 DIAGNOSIS — R0902 Hypoxemia: Secondary | ICD-10-CM | POA: Diagnosis not present

## 2016-03-26 DIAGNOSIS — N365 Urethral false passage: Secondary | ICD-10-CM | POA: Diagnosis not present

## 2016-03-28 ENCOUNTER — Telehealth: Payer: Self-pay | Admitting: Cardiology

## 2016-03-28 DIAGNOSIS — L84 Corns and callosities: Secondary | ICD-10-CM | POA: Diagnosis not present

## 2016-03-28 DIAGNOSIS — E1142 Type 2 diabetes mellitus with diabetic polyneuropathy: Secondary | ICD-10-CM | POA: Diagnosis not present

## 2016-03-28 DIAGNOSIS — B351 Tinea unguium: Secondary | ICD-10-CM | POA: Diagnosis not present

## 2016-03-28 NOTE — Telephone Encounter (Signed)
Frank Hoover pt's niece and aid would like someone to give her a call [phone] --the pt is going to attend a funeral 6 hrs away and she wants to make sure he'll be ok to ride that long

## 2016-03-28 NOTE — Telephone Encounter (Signed)
Car trip is ok from a heart standpoint   Zandra Abts MD

## 2016-03-28 NOTE — Telephone Encounter (Signed)
Will forward to Dr. Branch. 

## 2016-03-28 NOTE — Telephone Encounter (Signed)
Spoke with pt's caretaker, and let her know a trip was fine for the pt. From a heart standpoint.

## 2016-04-01 ENCOUNTER — Ambulatory Visit (HOSPITAL_COMMUNITY)
Admission: RE | Admit: 2016-04-01 | Discharge: 2016-04-01 | Disposition: A | Discharge status: Home / Self Care | Payer: Medicare Other | Source: Ambulatory Visit | Attending: Cardiology | Admitting: Cardiology

## 2016-04-01 DIAGNOSIS — I5022 Chronic systolic (congestive) heart failure: Secondary | ICD-10-CM | POA: Diagnosis not present

## 2016-04-01 DIAGNOSIS — I340 Nonrheumatic mitral (valve) insufficiency: Secondary | ICD-10-CM | POA: Diagnosis not present

## 2016-04-01 NOTE — Progress Notes (Signed)
*  PRELIMINARY RESULTS* Echocardiogram 2D Echocardiogram has been performed.  Leavy Cella 04/01/2016, 11:23 AM

## 2016-04-03 DIAGNOSIS — L89159 Pressure ulcer of sacral region, unspecified stage: Secondary | ICD-10-CM | POA: Diagnosis not present

## 2016-04-03 DIAGNOSIS — J449 Chronic obstructive pulmonary disease, unspecified: Secondary | ICD-10-CM | POA: Diagnosis not present

## 2016-04-03 DIAGNOSIS — R2681 Unsteadiness on feet: Secondary | ICD-10-CM | POA: Diagnosis not present

## 2016-04-03 DIAGNOSIS — M6281 Muscle weakness (generalized): Secondary | ICD-10-CM | POA: Diagnosis not present

## 2016-04-03 DIAGNOSIS — I2510 Atherosclerotic heart disease of native coronary artery without angina pectoris: Secondary | ICD-10-CM | POA: Diagnosis not present

## 2016-04-16 ENCOUNTER — Telehealth: Payer: Self-pay

## 2016-04-16 NOTE — Telephone Encounter (Signed)
Pt's caretaker called with concerns of the patient having puffiness in his face and in his feet. She stated that she did elevate his feet and it did help with the swelling. His weight is down from LOV ( 03/25/16 - 229 lbs) today his weight is 221 lbs. He is not having any SOB. He did state that he has been very tired since the weekend and he is always feeling weak like. Please Advise.

## 2016-04-16 NOTE — Telephone Encounter (Signed)
Spoke with caretaker, she states he has an appointment with pcp in the morning. She will update [place] with outcome.

## 2016-04-16 NOTE — Telephone Encounter (Signed)
If his weight is down that much his symptoms are not likely heart failure related. Heart failure also does not cause facial swelling. He needs to touch base with his pcp for initial evaluation.   Zandra Abts MD

## 2016-04-17 DIAGNOSIS — N184 Chronic kidney disease, stage 4 (severe): Secondary | ICD-10-CM | POA: Diagnosis not present

## 2016-04-17 DIAGNOSIS — I110 Hypertensive heart disease with heart failure: Secondary | ICD-10-CM | POA: Diagnosis not present

## 2016-04-17 DIAGNOSIS — I10 Essential (primary) hypertension: Secondary | ICD-10-CM | POA: Diagnosis not present

## 2016-04-17 DIAGNOSIS — J449 Chronic obstructive pulmonary disease, unspecified: Secondary | ICD-10-CM | POA: Diagnosis not present

## 2016-04-18 ENCOUNTER — Ambulatory Visit (INDEPENDENT_AMBULATORY_CARE_PROVIDER_SITE_OTHER): Payer: Medicare Other | Admitting: Nurse Practitioner

## 2016-04-18 ENCOUNTER — Encounter: Payer: Self-pay | Admitting: Nurse Practitioner

## 2016-04-18 DIAGNOSIS — R197 Diarrhea, unspecified: Secondary | ICD-10-CM | POA: Diagnosis not present

## 2016-04-18 NOTE — Patient Instructions (Signed)
1. Hold the Imodium for the next couple days. 2. We will give any cups to collect a stool sample. The stool needs to be diarrhea for the lab to run tests. 3. These tests will check for infection as well as "lazy pancreas" 4. When you have provided liquid stool sample to the lab, you can continue the Imodium as you have been. 5. Take a probiotic for the next 30 days to see if this helps.

## 2016-04-18 NOTE — Progress Notes (Signed)
Referring Provider: Sinda Du, MD Primary Care Physician:  Alonza Bogus, MD Primary GI:  Dr. Gala Romney  Chief Complaint  Patient presents with  . Constipation    at times    HPI:   Frank Hoover is a 66 y.o. male who presents For follow-up on constipation. The patient was last seen in our office 10/17/2015. At that time he was feeling well overall, noting Perry umbilical and lower abdominal pain with associated nausea and vomiting postprandially. Bowel movement every 2-3 days with hard stools and straining, no hematochezia. Decreased appetite due to nausea. Linzess 290 g and Senokot twice daily with refractory constipation. Has had multiple occurrences of recent significant illness most recently in mid 2017 for urosepsis, cardiogenic shock, ventilation required. Developed rectal bleeding as acute ischemic colitis in the setting of shock and anticoagulation. Last colonoscopy 06/22/2014 which found poor/inadequate prep precluding complete examination and noted to be best served by one year repeat exam, consider tertiary referral center. Multiple poor preps historically. It appears she was evaluated at [facility], last seen in December 2017. Unsure of the results of colonoscopy and/or endoscopy.  Today he states he's doing well. Had 3 attempts at colonoscopy at [facility] which were unsuccessful so his PCP has told him to not continue attempts at [place]. Doing well overall. Denies abdominal pain, N/V. Is not constipated. Is having diarrhea. Having 2-3 loose stools a day. Denies hematochezia, melena. Has had diarrhea about 2-3 weeks. Is not taking Linzess. Has imodium if needed, works well as OTC version. States the version of Imodium is different or "newer" than the previous kind. Takes this about 1-2 times a day. Has some weakness last week with some incidental hypotension, no syncope. Denies chest pain, dyspnea, syncope, near syncope. Denies any other upper or  lower GI symptoms.  Past Medical History:  Diagnosis Date  . Adenomatous colon polyp 10/30/09   Serrated adenoma removed during Colonoscopy   . Arthritis    "legs" (09/27/2015)  . Atrial flutter with rapid ventricular response ([place]) 09/27/2015  . CKD (chronic kidney disease) stage 3, GFR 30-59 ml/min   . COPD (chronic obstructive pulmonary disease) ([place])   . Diverticulosis of colon   . Dysrhythmia   . Essential hypertension   . Gastroparesis   . GERD (gastroesophageal reflux disease)   . Gout   . Hyperlipemia   . Hypertension   . OA (osteoarthritis)   . Type 2 diabetes mellitus ([place])     Past Surgical History:  Procedure Laterality Date  . CARDIOVERSION N/A 08/22/2015   Procedure: CARDIOVERSION;  Surgeon: Larey Dresser, MD;  Location: [place];  Service: Cardiovascular;  Laterality: N/A;  . CARDIOVERSION N/A 09/01/2015   Procedure: CARDIOVERSION;  Surgeon: Lelon Perla, MD;  Location: [place];  Service: Cardiovascular;  Laterality: N/A;  . CATARACT EXTRACTION W/ INTRAOCULAR LENS  IMPLANT, BILATERAL Bilateral   . COLONOSCOPY  10/30/09   WEX:[id number] diverticulum/serrated adenoma from ICV, next colonoscopy due 10/2012  . COLONOSCOPY N/A 09/18/2012   LFY:BOFBPZW mucosa that was seen appeared normal, however most of it was not seen due to be very poor prep  . COLONOSCOPY N/A 04/08/2013   CHE:NIDPOEU polyp removed/inadequate preparation  . COLONOSCOPY N/A 06/22/2014   Procedure: COLONOSCOPY;  Surgeon: Daneil Dolin, MD;  [facility];  Service: Endoscopy;  Laterality: N/A;  115   . ELECTROPHYSIOLOGIC STUDY N/A 09/27/2015   Procedure: SVT Ablation;  Surgeon: Evans Lance, MD;  Location:  [place] [facility];  Service: Cardiovascular;  Laterality: N/A;  . ESOPHAGOGASTRODUODENOSCOPY  10/30/09   HYQ:MVHQIO   . FLEXIBLE SIGMOIDOSCOPY N/A 08/25/2015   Procedure: FLEXIBLE SIGMOIDOSCOPY;  Surgeon: Jerene Bears, MD;  Location: [facility] ENDOSCOPY;  Service: Endoscopy;   Laterality: N/A;  . TEE WITHOUT CARDIOVERSION N/A 08/22/2015   Procedure: TRANSESOPHAGEAL ECHOCARDIOGRAM (TEE);  Surgeon: Larey Dresser, MD;  Location: [place];  Service: Cardiovascular;  Laterality: N/A;  . TEE WITHOUT CARDIOVERSION  09/27/2015   Procedure: Transesophageal Echocardiogram (Tee);  Surgeon: Evans Lance, MD;  Location: [place] [facility];  Service: Cardiovascular;;  . TONSILLECTOMY  1950s/1960s    Current Outpatient Prescriptions  Medication Sig Dispense Refill  . albuterol (PROAIR HFA) 108 (90 BASE) MCG/ACT inhaler Inhale 2 puffs into the lungs every 6 (six) hours as needed for wheezing or shortness of breath.     . allopurinol (ZYLOPRIM) 300 MG tablet Take 300 mg by mouth daily.      Marland Kitchen amiodarone (PACERONE) 200 MG tablet Take 200 mg by mouth daily.    Marland Kitchen atorvastatin (LIPITOR) 20 MG tablet Take 20 mg by mouth at bedtime.    . Cholecalciferol (VITAMIN D PO) Take 1,000 Units by mouth daily.    . furosemide (LASIX) 40 MG tablet Take 1 tablet (40 mg total) by mouth 2 (two) times daily. 180 tablet 3  . gabapentin (NEURONTIN) 400 MG capsule Take 400 mg by mouth 3 (three) times daily. Takes at noon & bedtime    . glimepiride (AMARYL) 2 MG tablet Take 2 mg by mouth daily.    Marland Kitchen HYDROcodone-acetaminophen (NORCO/VICODIN) 5-325 MG tablet Take 1 tablet by mouth every 6 (six) hours as needed for severe pain. (Patient taking differently: Take 1 tablet by mouth every 4 (four) hours as needed for severe pain. ) 10 tablet 0  . isosorbide mononitrate (IMDUR) 60 MG 24 hr tablet Take 1 tablet (60 mg total) by mouth daily. 30 tablet 0  . lisinopril (PRINIVIL,ZESTRIL) 2.5 MG tablet Take 2.5 mg by mouth daily.    Marland Kitchen loperamide (IMODIUM) 2 MG capsule Take 2 mg by mouth daily as needed for constipation.    Marland Kitchen loratadine (CLARITIN) 10 MG tablet Take 10 mg by mouth daily.    . metoCLOPramide (REGLAN) 5 MG tablet Take 5 mg by mouth 3 (three) times daily.    . metoprolol succinate (TOPROL-XL) 100 MG  24 hr tablet Take 1 tablet (100 mg total) by mouth 2 (two) times daily. Take with or immediately following a meal. 60 tablet 3  . Multiple Vitamin (MULTIVITAMIN WITH MINERALS) TABS tablet Take 1 tablet by mouth daily.    . ondansetron (ZOFRAN) 4 MG tablet Take 1 tablet (4 mg total) by mouth every 8 (eight) hours as needed for nausea or vomiting. 30 tablet 1  . pantoprazole (PROTONIX) 40 MG tablet Take 1 tablet (40 mg total) by mouth daily.    . tamsulosin (FLOMAX) 0.4 MG CAPS capsule Take 0.4 mg by mouth.    . vitamin C (ASCORBIC ACID) 500 MG tablet Take 500 mg by mouth 2 (two) times daily.     No current facility-administered medications for this visit.     Allergies as of 04/18/2016  . (No Known Allergies)    Family History  Problem Relation Age of Onset  . COPD Father   . Diabetes Mother   . Hypertension Mother   . Colon cancer Neg Hx     Social History   Social History  .  Marital status: Married    Spouse name: N/A  . Number of children: 2  . Years of education: N/A   Occupational History  . retired    Social History Main Topics  . Smoking status: Former Smoker    Packs/day: 1.00    Years: 4.00    Types: Cigarettes    Quit date: 08/26/2010  . Smokeless tobacco: Never Used  . Alcohol use No     Comment: "stopped in 2012 when I quit smoking"  . Drug use: No  . Sexual activity: Not Currently   Other Topics Concern  . None   Social History Narrative  . None    Review of Systems: General: Negative for anorexia, weight loss, fever, chills, fatigue, weakness. ENT: Negative for hoarseness, difficulty swallowing. CV: Negative for chest pain, angina, palpitations, peripheral edema.  Respiratory: Negative for dyspnea at rest, cough, sputum, wheezing.  GI: See history of present illness. Endo: Negative for unusual weight change.    Physical Exam: BP 108/64   Pulse (!) 54   Temp 97.6 F (36.4 C) (Oral)   Ht 5\' 6"  (1.676 m)   Wt 222 lb 3.2 oz (100.8 kg)   BMI  35.86 kg/m  General:   Alert and oriented. Pleasant and cooperative. Well-nourished and well-developed. Ambulates with walker. Eyes:  Without icterus, sclera clear and conjunctiva pink.  Ears:  Normal auditory acuity. Cardiovascular:  S1, S2 present without murmurs appreciated. Extremities without clubbing or edema. Respiratory:  Clear to auscultation bilaterally. No wheezes, rales, or rhonchi. No distress.  Gastrointestinal:  +BS, rounded but soft, non-tender and non-distended. No HSM noted. No guarding or rebound.  Rectal:  Deferred  Musculoskalatal:  Symmetrical without gross deformities. Neurologic:  Alert and oriented x4;  grossly normal neurologically. Psych:  Alert and cooperative. Normal mood and affect. Heme/Lymph/Immune: No excessive bruising noted.    04/18/2016 11:40 AM   Disclaimer: This note was dictated with voice recognition software. Similar sounding words can inadvertently be transcribed and may not be corrected upon review.

## 2016-04-19 NOTE — Assessment & Plan Note (Signed)
Noted diarrhea for which Imodium works well. Recommend he hold Imodium for now to allow for loose stools. I will order stool studies and pancreatic fecal elastase. After he provides a sample can go back on Imodium. Also recommend probiotic for 30 days for possible postinfectious IBS versus mild self-limited gastroenteritis. Return for follow-up in 2 months.

## 2016-04-22 NOTE — Progress Notes (Signed)
CC'D TO PCP °

## 2016-04-23 DIAGNOSIS — Z79899 Other long term (current) drug therapy: Secondary | ICD-10-CM | POA: Diagnosis not present

## 2016-04-23 DIAGNOSIS — D509 Iron deficiency anemia, unspecified: Secondary | ICD-10-CM | POA: Diagnosis not present

## 2016-04-23 DIAGNOSIS — R809 Proteinuria, unspecified: Secondary | ICD-10-CM | POA: Diagnosis not present

## 2016-04-23 DIAGNOSIS — N183 Chronic kidney disease, stage 3 (moderate): Secondary | ICD-10-CM | POA: Diagnosis not present

## 2016-04-23 DIAGNOSIS — E559 Vitamin D deficiency, unspecified: Secondary | ICD-10-CM | POA: Diagnosis not present

## 2016-04-23 DIAGNOSIS — I10 Essential (primary) hypertension: Secondary | ICD-10-CM | POA: Diagnosis not present

## 2016-04-26 DIAGNOSIS — G4733 Obstructive sleep apnea (adult) (pediatric): Secondary | ICD-10-CM | POA: Diagnosis not present

## 2016-04-26 DIAGNOSIS — R0902 Hypoxemia: Secondary | ICD-10-CM | POA: Diagnosis not present

## 2016-05-01 DIAGNOSIS — I509 Heart failure, unspecified: Secondary | ICD-10-CM | POA: Diagnosis not present

## 2016-05-01 DIAGNOSIS — I2510 Atherosclerotic heart disease of native coronary artery without angina pectoris: Secondary | ICD-10-CM | POA: Diagnosis not present

## 2016-05-01 DIAGNOSIS — L89159 Pressure ulcer of sacral region, unspecified stage: Secondary | ICD-10-CM | POA: Diagnosis not present

## 2016-05-01 DIAGNOSIS — R2681 Unsteadiness on feet: Secondary | ICD-10-CM | POA: Diagnosis not present

## 2016-05-01 DIAGNOSIS — N184 Chronic kidney disease, stage 4 (severe): Secondary | ICD-10-CM | POA: Diagnosis not present

## 2016-05-01 DIAGNOSIS — M6281 Muscle weakness (generalized): Secondary | ICD-10-CM | POA: Diagnosis not present

## 2016-05-01 DIAGNOSIS — N179 Acute kidney failure, unspecified: Secondary | ICD-10-CM | POA: Diagnosis not present

## 2016-05-01 DIAGNOSIS — J449 Chronic obstructive pulmonary disease, unspecified: Secondary | ICD-10-CM | POA: Diagnosis not present

## 2016-05-01 DIAGNOSIS — D638 Anemia in other chronic diseases classified elsewhere: Secondary | ICD-10-CM | POA: Diagnosis not present

## 2016-05-01 DIAGNOSIS — R197 Diarrhea, unspecified: Secondary | ICD-10-CM | POA: Diagnosis not present

## 2016-05-02 LAB — GASTROINTESTINAL PATHOGEN PANEL PCR
C. difficile Tox A/B, PCR: NOT DETECTED
Campylobacter, PCR: NOT DETECTED
Cryptosporidium, PCR: NOT DETECTED
E coli (ETEC) LT/ST PCR: NOT DETECTED
E coli (STEC) stx1/stx2, PCR: NOT DETECTED
E coli 0157, PCR: NOT DETECTED
Giardia lamblia, PCR: NOT DETECTED
Norovirus, PCR: NOT DETECTED
Rotavirus A, PCR: NOT DETECTED
Salmonella, PCR: NOT DETECTED
Shigella, PCR: NOT DETECTED

## 2016-05-02 LAB — CLOSTRIDIUM DIFFICILE BY PCR: Toxigenic C. Difficile by PCR: NOT DETECTED

## 2016-05-06 DIAGNOSIS — N183 Chronic kidney disease, stage 3 (moderate): Secondary | ICD-10-CM | POA: Diagnosis not present

## 2016-05-06 DIAGNOSIS — I10 Essential (primary) hypertension: Secondary | ICD-10-CM | POA: Diagnosis not present

## 2016-05-06 DIAGNOSIS — Z79899 Other long term (current) drug therapy: Secondary | ICD-10-CM | POA: Diagnosis not present

## 2016-05-08 ENCOUNTER — Other Ambulatory Visit (HOSPITAL_COMMUNITY): Payer: Self-pay | Admitting: Nephrology

## 2016-05-08 DIAGNOSIS — N179 Acute kidney failure, unspecified: Secondary | ICD-10-CM

## 2016-05-10 ENCOUNTER — Ambulatory Visit (HOSPITAL_COMMUNITY)
Admission: RE | Admit: 2016-05-10 | Discharge: 2016-05-10 | Disposition: A | Discharge status: Home / Self Care | Payer: Medicare Other | Source: Ambulatory Visit | Attending: Nephrology | Admitting: Nephrology

## 2016-05-10 DIAGNOSIS — N179 Acute kidney failure, unspecified: Secondary | ICD-10-CM | POA: Insufficient documentation

## 2016-05-11 LAB — PANCREATIC ELASTASE, FECAL: Pancreatic Elastase-1, Stool: <15 mcg/g — ABNORMAL LOW

## 2016-05-16 DIAGNOSIS — N183 Chronic kidney disease, stage 3 (moderate): Secondary | ICD-10-CM | POA: Diagnosis not present

## 2016-05-16 DIAGNOSIS — Z79899 Other long term (current) drug therapy: Secondary | ICD-10-CM | POA: Diagnosis not present

## 2016-05-16 DIAGNOSIS — I10 Essential (primary) hypertension: Secondary | ICD-10-CM | POA: Diagnosis not present

## 2016-05-17 ENCOUNTER — Encounter: Payer: Self-pay | Admitting: Cardiology

## 2016-05-17 ENCOUNTER — Ambulatory Visit (INDEPENDENT_AMBULATORY_CARE_PROVIDER_SITE_OTHER): Payer: Medicare Other | Admitting: Cardiology

## 2016-05-17 VITALS — BP 90/60 | HR 64 | Ht 66.0 in | Wt 224.0 lb

## 2016-05-17 DIAGNOSIS — I509 Heart failure, unspecified: Secondary | ICD-10-CM | POA: Diagnosis not present

## 2016-05-17 DIAGNOSIS — I10 Essential (primary) hypertension: Secondary | ICD-10-CM

## 2016-05-17 DIAGNOSIS — N183 Chronic kidney disease, stage 3 unspecified: Secondary | ICD-10-CM

## 2016-05-17 DIAGNOSIS — I5022 Chronic systolic (congestive) heart failure: Secondary | ICD-10-CM

## 2016-05-17 DIAGNOSIS — I4892 Unspecified atrial flutter: Secondary | ICD-10-CM

## 2016-05-17 DIAGNOSIS — N184 Chronic kidney disease, stage 4 (severe): Secondary | ICD-10-CM | POA: Diagnosis not present

## 2016-05-17 DIAGNOSIS — N179 Acute kidney failure, unspecified: Secondary | ICD-10-CM | POA: Diagnosis not present

## 2016-05-17 MED ORDER — ISOSORBIDE MONONITRATE ER 30 MG PO TB24: 30.0000 mg | ORAL_TABLET | Freq: Every day | ORAL | 3 refills | Status: DC

## 2016-05-17 NOTE — Patient Instructions (Signed)
Medication Instructions:  Decrease imdur to 30 mg daily   Labwork: none  Testing/Procedures: none  Follow-Up: Your physician recommends that you schedule a follow-up appointment in: 3 months with Dr. Harl Bowie Your physician recommends that you schedule a follow-up appointment in: ASAP with Dr. Lovena Le     Any Other Special Instructions Will Be Listed Below (If Applicable).     If you need a refill on your cardiac medications before your next appointment, please call your pharmacy.

## 2016-05-17 NOTE — Progress Notes (Addendum)
Clinical Summary Frank Hoover is a 66 y.o.male seen today for follow up of the following medical problems.   1. 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    - No recent palpiations since our last visit.     2. Chronic systolic HF - echo 10/9371 LVEF 25%, diffuse hypokinesis. Mod RV dysfunction.  - new diagnosis 08/2015 during admission with sepsis - thought possible tachcyardia mediated, however he has not had an ischemic evaluation - echo Jan 2018 LVEF 30-35%.   -.medical therapy due to CKD - no recent edema. No significant SOB or DOE  3, HTN - compliant with meds - reports some low bp's at times, get fatigued.   4. CKD IV - followed by Dr Hinda Lenis     Past Medical History:  Diagnosis Date  . Adenomatous colon polyp 10/30/09   Serrated adenoma removed during Colonoscopy   . Arthritis    "legs" (09/27/2015)  . Atrial flutter with rapid ventricular response ([place]) 09/27/2015  . CKD (chronic kidney disease) stage 3, GFR 30-59 ml/min   . COPD (chronic obstructive pulmonary disease) ([place])   . Diverticulosis of colon   . Dysrhythmia   . Essential hypertension   . Gastroparesis   . GERD (gastroesophageal reflux disease)   . Gout   . Hyperlipemia   . Hypertension   . OA (osteoarthritis)   . Type 2 diabetes mellitus (HCC)      No Known Allergies   Current Outpatient Prescriptions  Medication Sig Dispense Refill  . albuterol (PROAIR HFA) 108 (90 BASE) MCG/ACT inhaler Inhale 2 puffs into the lungs every 6 (six) hours as needed for wheezing or shortness of breath.     . allopurinol (ZYLOPRIM) 300 MG tablet Take 300 mg by mouth daily.      Marland Kitchen amiodarone (PACERONE) 200 MG tablet Take 200 mg by mouth daily.    Marland Kitchen atorvastatin (LIPITOR) 20 MG tablet Take 20 mg by mouth at bedtime.    . Cholecalciferol  (VITAMIN D PO) Take 1,000 Units by mouth daily.    . furosemide (LASIX) 40 MG tablet Take 1 tablet (40 mg total) by mouth 2 (two) times daily. 180 tablet 3  . gabapentin (NEURONTIN) 400 MG capsule Take 400 mg by mouth 3 (three) times daily. Takes at noon & bedtime    . glimepiride (AMARYL) 2 MG tablet Take 2 mg by mouth daily.    Marland Kitchen HYDROcodone-acetaminophen (NORCO/VICODIN) 5-325 MG tablet Take 1 tablet by mouth every 6 (six) hours as needed for severe pain. (Patient taking differently: Take 1 tablet by mouth every 4 (four) hours as needed for severe pain. ) 10 tablet 0  . isosorbide mononitrate (IMDUR) 60 MG 24 hr tablet Take 1 tablet (60 mg total) by mouth daily. 30 tablet 0  . lisinopril (PRINIVIL,ZESTRIL) 2.5 MG tablet Take 2.5 mg by mouth daily.    Marland Kitchen loperamide (IMODIUM) 2 MG capsule Take 2 mg by mouth daily as needed for constipation.    Marland Kitchen loratadine (CLARITIN) 10 MG tablet Take 10 mg by mouth daily.    . metoCLOPramide (REGLAN) 5 MG tablet Take 5 mg by mouth 3 (three) times daily.    . metoprolol succinate (TOPROL-XL) 100 MG 24 hr tablet Take 1 tablet (100 mg total) by mouth 2 (two) times daily. Take with or immediately following a meal. 60 tablet  3  . Multiple Vitamin (MULTIVITAMIN WITH MINERALS) TABS tablet Take 1 tablet by mouth daily.    . ondansetron (ZOFRAN) 4 MG tablet Take 1 tablet (4 mg total) by mouth every 8 (eight) hours as needed for nausea or vomiting. 30 tablet 1  . pantoprazole (PROTONIX) 40 MG tablet Take 1 tablet (40 mg total) by mouth daily.    . tamsulosin (FLOMAX) 0.4 MG CAPS capsule Take 0.4 mg by mouth.    . vitamin C (ASCORBIC ACID) 500 MG tablet Take 500 mg by mouth 2 (two) times daily.     No current facility-administered medications for this visit.      Past Surgical History:  Procedure Laterality Date  . CARDIOVERSION N/A 08/22/2015   Procedure: CARDIOVERSION;  Surgeon: Larey Dresser, MD;  Location: [place];  Service: Cardiovascular;  Laterality: N/A;    . CARDIOVERSION N/A 09/01/2015   Procedure: CARDIOVERSION;  Surgeon: Lelon Perla, MD;  Location: [place];  Service: Cardiovascular;  Laterality: N/A;  . CATARACT EXTRACTION W/ INTRAOCULAR LENS  IMPLANT, BILATERAL Bilateral   . COLONOSCOPY  10/30/09   HQI:[id number] diverticulum/serrated adenoma from ICV, next colonoscopy due 10/2012  . COLONOSCOPY N/A 09/18/2012   XLK:GMWNUUV mucosa that was seen appeared normal, however most of it was not seen due to be very poor prep  . COLONOSCOPY N/A 04/08/2013   OZD:GUYQIHK polyp removed/inadequate preparation  . COLONOSCOPY N/A 06/22/2014   Procedure: COLONOSCOPY;  Surgeon: Daneil Dolin, MD;  [facility];  Service: Endoscopy;  Laterality: N/A;  115   . ELECTROPHYSIOLOGIC STUDY N/A 09/27/2015   Procedure: SVT Ablation;  Surgeon: Evans Lance, MD;  Location: [place] [facility];  Service: Cardiovascular;  Laterality: N/A;  . ESOPHAGOGASTRODUODENOSCOPY  10/30/09   VQQ:VZDGLO   . FLEXIBLE SIGMOIDOSCOPY N/A 08/25/2015   Procedure: FLEXIBLE SIGMOIDOSCOPY;  Surgeon: Jerene Bears, MD;  Location: [facility] ENDOSCOPY;  Service: Endoscopy;  Laterality: N/A;  . TEE WITHOUT CARDIOVERSION N/A 08/22/2015   Procedure: TRANSESOPHAGEAL ECHOCARDIOGRAM (TEE);  Surgeon: Larey Dresser, MD;  Location: [place];  Service: Cardiovascular;  Laterality: N/A;  . TEE WITHOUT CARDIOVERSION  09/27/2015   Procedure: Transesophageal Echocardiogram (Tee);  Surgeon: Evans Lance, MD;  Location: [place] [facility];  Service: Cardiovascular;;  . TONSILLECTOMY  1950s/1960s     No Known Allergies    Family History  Problem Relation Age of Onset  . COPD Father   . Diabetes Mother   . Hypertension Mother   . Colon cancer Neg Hx      Social History Frank Hoover reports that he quit smoking about 5 years ago. His smoking use included Cigarettes. He has a 4.00 pack-year smoking history. He has never used smokeless tobacco. Frank Hoover reports that he does not drink  alcohol.   Review of Systems CONSTITUTIONAL: No weight loss, fever, chills, weakness or fatigue.  HEENT: Eyes: No visual loss, blurred vision, double vision or yellow sclerae.No hearing loss, sneezing, congestion, runny nose or sore throat.  SKIN: No rash or itching.  CARDIOVASCULAR: per HPI RESPIRATORY: No shortness of breath, cough or sputum.  GASTROINTESTINAL: No anorexia, nausea, vomiting or diarrhea. No abdominal pain or blood.  GENITOURINARY: No burning on urination, no polyuria NEUROLOGICAL: No headache, dizziness, syncope, paralysis, ataxia, numbness or tingling in the extremities. No change in bowel or bladder control.  MUSCULOSKELETAL: No muscle, back pain, joint pain or stiffness.  LYMPHATICS: No enlarged nodes. No history of splenectomy.  PSYCHIATRIC: No history of depression or  anxiety.  ENDOCRINOLOGIC: No reports of sweating, cold or heat intolerance. No polyuria or polydipsia.  Marland Kitchen   Physical Examination Vitals:   05/17/16 1319  BP: 90/60  Pulse: 64   Vitals:   05/17/16 1319  Weight: 224 lb (101.6 kg)  Height: 5' 6"  (1.676 m)    Gen: resting comfortably, no acute distress HEENT: no scleral icterus, pupils equal round and reactive, no palptable cervical adenopathy,  CV: RRR, no m/r/g, no jvd Resp: Clear to auscultation bilaterally GI: abdomen is soft, non-tender, non-distended, normal bowel sounds, no hepatosplenomegaly MSK: extremities are warm, no edema.  Skin: warm, no rash Neuro:  no focal deficits Psych: appropriate affect   Diagnostic Studies  Jan 2018 echo Study Conclusions  - Left ventricle: The cavity size was normal. Systolic function was   moderately to severely reduced. The estimated ejection fraction   was in the range of 30% to 35%. Diffuse hypokinesis. Doppler   parameters are consistent with abnormal left ventricular   relaxation (grade 1 diastolic dysfunction). Mild to moderate LVH. - Mitral valve: There was mild regurgitation. - Left  atrium: The atrium was mildly dilated. - Right ventricle: Systolic function was mildly to moderately   reduced. - Atrial septum: No defect or patent foramen ovale was identified.   Assessment and Plan   1. Aflutter  - s/p ablation, followed by EP. EKG in clinic shows SR, 1st degree av block - no recent symptoms. We will continue current meds  2. Chronic systolic HF - medical therapy limited by CKD III - Renal function continues to prohibit cath.  - we will refer to EP to see if he is possible ICD candidate.   3. HTN - at goal. Reports some symptomatic low bp's. We will lower imdur to 72m daily  4. Morbid obesity - counseled on dietary changes and weight loss   f/u 3 months        JArnoldo Lenis M.D

## 2016-05-24 DIAGNOSIS — G4733 Obstructive sleep apnea (adult) (pediatric): Secondary | ICD-10-CM | POA: Diagnosis not present

## 2016-05-24 DIAGNOSIS — R0902 Hypoxemia: Secondary | ICD-10-CM | POA: Diagnosis not present

## 2016-06-01 DIAGNOSIS — L89159 Pressure ulcer of sacral region, unspecified stage: Secondary | ICD-10-CM | POA: Diagnosis not present

## 2016-06-01 DIAGNOSIS — J449 Chronic obstructive pulmonary disease, unspecified: Secondary | ICD-10-CM | POA: Diagnosis not present

## 2016-06-01 DIAGNOSIS — R2681 Unsteadiness on feet: Secondary | ICD-10-CM | POA: Diagnosis not present

## 2016-06-01 DIAGNOSIS — M6281 Muscle weakness (generalized): Secondary | ICD-10-CM | POA: Diagnosis not present

## 2016-06-01 DIAGNOSIS — I2510 Atherosclerotic heart disease of native coronary artery without angina pectoris: Secondary | ICD-10-CM | POA: Diagnosis not present

## 2016-06-06 ENCOUNTER — Encounter: Payer: Self-pay | Admitting: Internal Medicine

## 2016-06-06 ENCOUNTER — Ambulatory Visit (INDEPENDENT_AMBULATORY_CARE_PROVIDER_SITE_OTHER): Payer: Medicare Other | Admitting: Internal Medicine

## 2016-06-06 ENCOUNTER — Encounter: Payer: Self-pay | Admitting: *Deleted

## 2016-06-06 VITALS — BP 96/62 | HR 58 | Ht 66.0 in | Wt 220.2 lb

## 2016-06-06 DIAGNOSIS — M79676 Pain in unspecified toe(s): Secondary | ICD-10-CM | POA: Diagnosis not present

## 2016-06-06 DIAGNOSIS — E1142 Type 2 diabetes mellitus with diabetic polyneuropathy: Secondary | ICD-10-CM | POA: Diagnosis not present

## 2016-06-06 DIAGNOSIS — I5022 Chronic systolic (congestive) heart failure: Secondary | ICD-10-CM

## 2016-06-06 DIAGNOSIS — L84 Corns and callosities: Secondary | ICD-10-CM | POA: Diagnosis not present

## 2016-06-06 DIAGNOSIS — B351 Tinea unguium: Secondary | ICD-10-CM | POA: Diagnosis not present

## 2016-06-06 NOTE — Progress Notes (Signed)
HPI [name] returns today for followup. He is a pleasant 66 yo man with multiple medical problems who has had both atrial flutter and SVT (AVNRT). He underwent EP study and catheter ablation of both arrhythmias several months ago and returns today for followup. In the interim, he has been found to have severe LV dysfunction without improvement on medical therapy. He has been on medical therapy but has been limited by hypotension. His EF by echo has been 30%. No syncope.  No Known Allergies   Current Outpatient Prescriptions  Medication Sig Dispense Refill  . albuterol (PROAIR HFA) 108 (90 BASE) MCG/ACT inhaler Inhale 2 puffs into the lungs every 6 (six) hours as needed for wheezing or shortness of breath.     . allopurinol (ZYLOPRIM) 300 MG tablet Take 300 mg by mouth daily.      Marland Kitchen amiodarone (PACERONE) 200 MG tablet Take 200 mg by mouth daily.    Marland Kitchen atorvastatin (LIPITOR) 20 MG tablet Take 20 mg by mouth at bedtime.    . Cholecalciferol (VITAMIN D PO) Take 1,000 Units by mouth daily.    . furosemide (LASIX) 40 MG tablet Take 1 tablet (40 mg total) by mouth 2 (two) times daily. 180 tablet 3  . gabapentin (NEURONTIN) 400 MG capsule Take 400 mg by mouth 3 (three) times daily. Takes at noon & bedtime    . glimepiride (AMARYL) 2 MG tablet Take 2 mg by mouth daily.    Marland Kitchen HYDROcodone-acetaminophen (NORCO/VICODIN) 5-325 MG tablet Take 1 tablet by mouth every 6 (six) hours as needed for severe pain. 10 tablet 0  . isosorbide mononitrate (IMDUR) 30 MG 24 hr tablet Take 1 tablet (30 mg total) by mouth daily. 90 tablet 3  . loperamide (IMODIUM) 2 MG capsule Take 2 mg by mouth daily as needed for constipation.    Marland Kitchen loratadine (CLARITIN) 10 MG tablet Take 10 mg by mouth daily.    . metoprolol succinate (TOPROL-XL) 100 MG 24 hr tablet Take 1 tablet (100 mg total) by mouth 2 (two) times daily. Take with or immediately following a meal. 60 tablet 3  . Multiple Vitamin (MULTIVITAMIN WITH MINERALS) TABS  tablet Take 1 tablet by mouth daily.    . ondansetron (ZOFRAN) 4 MG tablet Take 1 tablet (4 mg total) by mouth every 8 (eight) hours as needed for nausea or vomiting. 30 tablet 1  . pantoprazole (PROTONIX) 40 MG tablet Take 1 tablet (40 mg total) by mouth daily.    . vitamin C (ASCORBIC ACID) 500 MG tablet Take 500 mg by mouth 2 (two) times daily.     No current facility-administered medications for this visit.      Past Medical History:  Diagnosis Date  . Adenomatous colon polyp 10/30/09   Serrated adenoma removed during Colonoscopy   . Arthritis    "legs" (09/27/2015)  . Atrial flutter with rapid ventricular response ([place]) 09/27/2015  . CKD (chronic kidney disease) stage 3, GFR 30-59 ml/min   . COPD (chronic obstructive pulmonary disease) ([place])   . Diverticulosis of colon   . Dysrhythmia   . Essential hypertension   . Gastroparesis   . GERD (gastroesophageal reflux disease)   . Gout   . Hyperlipemia   . Hypertension   . OA (osteoarthritis)   . Type 2 diabetes mellitus (HCC)     ROS:   All systems reviewed and negative except as noted in the HPI.   Past Surgical History:  Procedure Laterality Date  .  CARDIOVERSION N/A 08/22/2015   Procedure: CARDIOVERSION;  Surgeon: Larey Dresser, MD;  Location: [place];  Service: Cardiovascular;  Laterality: N/A;  . CARDIOVERSION N/A 09/01/2015   Procedure: CARDIOVERSION;  Surgeon: Lelon Perla, MD;  Location: [place];  Service: Cardiovascular;  Laterality: N/A;  . CATARACT EXTRACTION W/ INTRAOCULAR LENS  IMPLANT, BILATERAL Bilateral   . COLONOSCOPY  10/30/09   TOI:[id number] diverticulum/serrated adenoma from ICV, next colonoscopy due 10/2012  . COLONOSCOPY N/A 09/18/2012   IPJ:ASNKNLZ mucosa that was seen appeared normal, however most of it was not seen due to be very poor prep  . COLONOSCOPY N/A 04/08/2013   JQB:HALPFXT polyp removed/inadequate preparation  . COLONOSCOPY N/A 06/22/2014   Procedure: COLONOSCOPY;  Surgeon:  Daneil Dolin, MD;  [facility];  Service: Endoscopy;  Laterality: N/A;  115   . ELECTROPHYSIOLOGIC STUDY N/A 09/27/2015   Procedure: SVT Ablation;  Surgeon: Evans Lance, MD;  Location: [place] [facility];  Service: Cardiovascular;  Laterality: N/A;  . ESOPHAGOGASTRODUODENOSCOPY  10/30/09   KWI:OXBDZH   . FLEXIBLE SIGMOIDOSCOPY N/A 08/25/2015   Procedure: FLEXIBLE SIGMOIDOSCOPY;  Surgeon: Jerene Bears, MD;  Location: [facility] ENDOSCOPY;  Service: Endoscopy;  Laterality: N/A;  . TEE WITHOUT CARDIOVERSION N/A 08/22/2015   Procedure: TRANSESOPHAGEAL ECHOCARDIOGRAM (TEE);  Surgeon: Larey Dresser, MD;  Location: [place];  Service: Cardiovascular;  Laterality: N/A;  . TEE WITHOUT CARDIOVERSION  09/27/2015   Procedure: Transesophageal Echocardiogram (Tee);  Surgeon: Evans Lance, MD;  Location: [place] [facility];  Service: Cardiovascular;;  . TONSILLECTOMY  1950s/1960s     Family History  Problem Relation Age of Onset  . COPD Father   . Diabetes Mother   . Hypertension Mother   . Colon cancer Neg Hx      Social History   Social History  . Marital status: Married    Spouse name: N/A  . Number of children: 2  . Years of education: N/A   Occupational History  . retired    Social History Main Topics  . Smoking status: Former Smoker    Packs/day: 1.00    Years: 4.00    Types: Cigarettes    Quit date: 08/26/2010  . Smokeless tobacco: Never Used  . Alcohol use No     Comment: "stopped in 2012 when I quit smoking"  . Drug use: No  . Sexual activity: Not Currently   Other Topics Concern  . Not on file   Social History Narrative  . No narrative on file     BP 96/62   Pulse (!) 58   Ht 5' 6"  (1.676 m)   Wt 220 lb 3.2 oz (99.9 kg)   SpO2 97%   BMI 35.54 kg/m   Physical Exam:  Well appearing 66 yo man, NAD HEENT: Unremarkable Neck:  6 cm JVD, no thyromegally Lymphatics:  No adenopathy Back:  No CVA tenderness Lungs:  Clear with no wheezes HEART:   Regular rate rhythm, no murmurs, no rubs, no clicks Abd:  soft, positive bowel sounds, no organomegally, no rebound, no guarding Ext:  2 plus pulses, no edema, no cyanosis, no clubbing Skin:  No rashes no nodules Neuro:  CN II through XII intact, motor grossly intact  EKG - nsr with marked first degree AV block.  Assess/Plan: 1. 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 He is stable although there is a h/o atrial fib. 2. HTN - his blood pressure is well controlled.  3. Diarrhea - this was a problem several months ago. He notes that he is down to 1-2 BM's a day. 4. Chronic systolic heart failure - he has an EF of 30% by echo. I have discussed the indications including the risks/benefits/goals/expectations of ICD insertion and he wishes to proceed with ICD insertion.  Frank Hoover,M.D.  Frank Hoover.D.

## 2016-06-06 NOTE — Patient Instructions (Signed)
Medication Instructions:  Your physician recommends that you continue on your current medications as directed. Please refer to the Current Medication list given to you today.   Labwork: Lab work to be done today--BMP, CBC  Testing/Procedures: Your physician has recommended that you have a defibrillator inserted. An implantable cardioverter defibrillator (ICD) is a small device that is placed in your chest or, in rare cases, your abdomen. This device uses electrical pulses or shocks to help control life-threatening, irregular heartbeats that could lead the heart to suddenly stop beating (sudden cardiac arrest). Leads are attached to the ICD that goes into your heart. This is done in the hospital and usually requires an overnight stay. Please see the instruction sheet given to you today for more information. Scheduled for April 10,2018    Follow-Up: To be arranged after procedure  Any Other Special Instructions Will Be Listed Below (If Applicable).     If you need a refill on your cardiac medications before your next appointment, please call your pharmacy.

## 2016-06-07 LAB — CBC WITH DIFFERENTIAL/PLATELET
Basophils Absolute: 0 10*3/uL (ref 0.0–0.2)
Basos: 0 %
EOS (ABSOLUTE): 0.2 10*3/uL (ref 0.0–0.4)
Eos: 4 %
Hematocrit: 32.2 % — ABNORMAL LOW (ref 37.5–51.0)
Hemoglobin: 10.7 g/dL — ABNORMAL LOW (ref 13.0–17.7)
Immature Grans (Abs): 0 10*3/uL (ref 0.0–0.1)
Immature Granulocytes: 0 %
Lymphocytes Absolute: 2.2 10*3/uL (ref 0.7–3.1)
Lymphs: 46 %
MCH: 31.5 pg (ref 26.6–33.0)
MCHC: 33.2 g/dL (ref 31.5–35.7)
MCV: 95 fL (ref 79–97)
Monocytes Absolute: 0.2 10*3/uL (ref 0.1–0.9)
Monocytes: 4 %
Neutrophils Absolute: 2.2 10*3/uL (ref 1.4–7.0)
Neutrophils: 46 %
Platelets: 165 10*3/uL (ref 150–379)
RBC: 3.4 x10E6/uL — ABNORMAL LOW (ref 4.14–5.80)
RDW: 15.6 % — ABNORMAL HIGH (ref 12.3–15.4)
WBC: 4.8 10*3/uL (ref 3.4–10.8)

## 2016-06-07 LAB — BASIC METABOLIC PANEL
BUN/Creatinine Ratio: 11 (ref 10–24)
BUN: 28 mg/dL — ABNORMAL HIGH (ref 8–27)
CO2: 22 mmol/L (ref 18–29)
Calcium: 9.8 mg/dL (ref 8.6–10.2)
Chloride: 99 mmol/L (ref 96–106)
Creatinine, Ser: 2.64 mg/dL — ABNORMAL HIGH (ref 0.76–1.27)
GFR calc Af Amer: 28 mL/min/{1.73_m2} — ABNORMAL LOW (ref >59)
GFR calc non Af Amer: 24 mL/min/{1.73_m2} — ABNORMAL LOW (ref >59)
Glucose: 144 mg/dL — ABNORMAL HIGH (ref 65–99)
Potassium: 3.6 mmol/L (ref 3.5–5.2)
Sodium: 139 mmol/L (ref 134–144)

## 2016-06-11 ENCOUNTER — Ambulatory Visit (HOSPITAL_COMMUNITY)
Admission: RE | Admit: 2016-06-11 | Discharge: 2016-06-12 | Disposition: A | Discharge status: Home / Self Care | Payer: Medicare Other | Source: Ambulatory Visit | Attending: Internal Medicine | Admitting: Internal Medicine

## 2016-06-11 ENCOUNTER — Encounter (HOSPITAL_COMMUNITY)
Admission: RE | Disposition: A | Discharge status: Home / Self Care | Payer: Self-pay | Source: Ambulatory Visit | Attending: Internal Medicine

## 2016-06-11 ENCOUNTER — Encounter (HOSPITAL_COMMUNITY): Payer: Self-pay | Admitting: General Practice

## 2016-06-11 DIAGNOSIS — I428 Other cardiomyopathies: Secondary | ICD-10-CM | POA: Insufficient documentation

## 2016-06-11 DIAGNOSIS — K219 Gastro-esophageal reflux disease without esophagitis: Secondary | ICD-10-CM | POA: Diagnosis not present

## 2016-06-11 DIAGNOSIS — I130 Hypertensive heart and chronic kidney disease with heart failure and stage 1 through stage 4 chronic kidney disease, or unspecified chronic kidney disease: Secondary | ICD-10-CM | POA: Diagnosis not present

## 2016-06-11 DIAGNOSIS — M109 Gout, unspecified: Secondary | ICD-10-CM | POA: Insufficient documentation

## 2016-06-11 DIAGNOSIS — R197 Diarrhea, unspecified: Secondary | ICD-10-CM | POA: Insufficient documentation

## 2016-06-11 DIAGNOSIS — K3184 Gastroparesis: Secondary | ICD-10-CM | POA: Diagnosis not present

## 2016-06-11 DIAGNOSIS — I471 Supraventricular tachycardia: Secondary | ICD-10-CM | POA: Diagnosis not present

## 2016-06-11 DIAGNOSIS — M1990 Unspecified osteoarthritis, unspecified site: Secondary | ICD-10-CM | POA: Diagnosis not present

## 2016-06-11 DIAGNOSIS — I429 Cardiomyopathy, unspecified: Secondary | ICD-10-CM | POA: Diagnosis not present

## 2016-06-11 DIAGNOSIS — E1122 Type 2 diabetes mellitus with diabetic chronic kidney disease: Secondary | ICD-10-CM | POA: Insufficient documentation

## 2016-06-11 DIAGNOSIS — Z95810 Presence of automatic (implantable) cardiac defibrillator: Secondary | ICD-10-CM

## 2016-06-11 DIAGNOSIS — I959 Hypotension, unspecified: Secondary | ICD-10-CM | POA: Insufficient documentation

## 2016-06-11 DIAGNOSIS — E785 Hyperlipidemia, unspecified: Secondary | ICD-10-CM | POA: Diagnosis not present

## 2016-06-11 DIAGNOSIS — E1143 Type 2 diabetes mellitus with diabetic autonomic (poly)neuropathy: Secondary | ICD-10-CM | POA: Diagnosis not present

## 2016-06-11 DIAGNOSIS — Z794 Long term (current) use of insulin: Secondary | ICD-10-CM | POA: Insufficient documentation

## 2016-06-11 DIAGNOSIS — J449 Chronic obstructive pulmonary disease, unspecified: Secondary | ICD-10-CM | POA: Diagnosis not present

## 2016-06-11 DIAGNOSIS — N183 Chronic kidney disease, stage 3 (moderate): Secondary | ICD-10-CM | POA: Insufficient documentation

## 2016-06-11 DIAGNOSIS — I4892 Unspecified atrial flutter: Secondary | ICD-10-CM | POA: Diagnosis not present

## 2016-06-11 DIAGNOSIS — I5020 Unspecified systolic (congestive) heart failure: Secondary | ICD-10-CM | POA: Diagnosis present

## 2016-06-11 DIAGNOSIS — I5022 Chronic systolic (congestive) heart failure: Secondary | ICD-10-CM | POA: Diagnosis present

## 2016-06-11 DIAGNOSIS — Z87891 Personal history of nicotine dependence: Secondary | ICD-10-CM | POA: Insufficient documentation

## 2016-06-11 HISTORY — DX: Presence of automatic (implantable) cardiac defibrillator: Z95.810

## 2016-06-11 HISTORY — DX: Heart failure, unspecified: I50.9

## 2016-06-11 HISTORY — PX: EP IMPLANTABLE DEVICE: SHX172B

## 2016-06-11 HISTORY — PX: ICD IMPLANT: EP1208

## 2016-06-11 LAB — GLUCOSE, CAPILLARY
Glucose-Capillary: 75 mg/dL (ref 65–99)
Glucose-Capillary: 86 mg/dL (ref 65–99)
Glucose-Capillary: 169 mg/dL — ABNORMAL HIGH (ref 65–99)
Glucose-Capillary: 191 mg/dL — ABNORMAL HIGH (ref 65–99)

## 2016-06-11 LAB — SURGICAL PCR SCREEN
MRSA, PCR: NEGATIVE
Staphylococcus aureus: NEGATIVE

## 2016-06-11 SURGERY — ICD IMPLANT

## 2016-06-11 MED ORDER — FUROSEMIDE 40 MG PO TABS
40.0000 mg | ORAL_TABLET | Freq: Two times a day (BID) | ORAL | Status: DC
  Administered 2016-06-11 – 2016-06-12 (×2): 40 mg via ORAL
  Filled 2016-06-11 (×3): qty 1

## 2016-06-11 MED ORDER — VITAMIN D 1000 UNITS PO TABS
1000.0000 [IU] | ORAL_TABLET | Freq: Every day | ORAL | Status: DC
  Administered 2016-06-11 – 2016-06-12 (×2): 1000 [IU] via ORAL
  Filled 2016-06-11 (×2): qty 1

## 2016-06-11 MED ORDER — INSULIN LISPRO 100 UNIT/ML (KWIKPEN): 2.0000 [IU] | PEN_INJECTOR | SUBCUTANEOUS | Status: DC

## 2016-06-11 MED ORDER — CEFAZOLIN IN D5W 1 GM/50ML IV SOLN
1.0000 g | Freq: Four times a day (QID) | INTRAVENOUS | Status: AC
  Administered 2016-06-11 – 2016-06-12 (×3): 1 g via INTRAVENOUS
  Filled 2016-06-11 (×3): qty 50

## 2016-06-11 MED ORDER — GABAPENTIN 400 MG PO CAPS
400.0000 mg | ORAL_CAPSULE | Freq: Two times a day (BID) | ORAL | Status: DC
  Administered 2016-06-11 (×2): 400 mg via ORAL
  Filled 2016-06-11 (×2): qty 1

## 2016-06-11 MED ORDER — FENTANYL CITRATE (PF) 100 MCG/2ML IJ SOLN
INTRAMUSCULAR | Status: AC
  Filled 2016-06-11: qty 2

## 2016-06-11 MED ORDER — PANTOPRAZOLE SODIUM 40 MG PO TBEC
40.0000 mg | DELAYED_RELEASE_TABLET | Freq: Every day | ORAL | Status: DC
  Administered 2016-06-11 – 2016-06-12 (×2): 40 mg via ORAL
  Filled 2016-06-11 (×2): qty 1

## 2016-06-11 MED ORDER — ALBUTEROL SULFATE (2.5 MG/3ML) 0.083% IN NEBU: 3.0000 mL | INHALATION_SOLUTION | Freq: Four times a day (QID) | RESPIRATORY_TRACT | Status: DC | PRN

## 2016-06-11 MED ORDER — MUPIROCIN 2 % EX OINT
TOPICAL_OINTMENT | Freq: Once | CUTANEOUS | Status: AC
  Administered 2016-06-11: 1 via NASAL
  Filled 2016-06-11: qty 22

## 2016-06-11 MED ORDER — GENTAMICIN SULFATE 40 MG/ML IJ SOLN
INTRAMUSCULAR | Status: AC
  Filled 2016-06-11: qty 2

## 2016-06-11 MED ORDER — HYDROCODONE-ACETAMINOPHEN 5-325 MG PO TABS: 1.0000 | ORAL_TABLET | Freq: Four times a day (QID) | ORAL | Status: DC | PRN

## 2016-06-11 MED ORDER — MUPIROCIN 2 % EX OINT
TOPICAL_OINTMENT | CUTANEOUS | Status: AC
  Filled 2016-06-11: qty 22

## 2016-06-11 MED ORDER — ISOSORBIDE MONONITRATE ER 30 MG PO TB24
30.0000 mg | ORAL_TABLET | Freq: Every day | ORAL | Status: DC
  Administered 2016-06-11 – 2016-06-12 (×2): 30 mg via ORAL
  Filled 2016-06-11 (×2): qty 1

## 2016-06-11 MED ORDER — LIDOCAINE HCL (PF) 1 % IJ SOLN
INTRAMUSCULAR | Status: DC | PRN
  Administered 2016-06-11: 25 mL via SUBCUTANEOUS
  Administered 2016-06-11: 35 mL via SUBCUTANEOUS

## 2016-06-11 MED ORDER — LOPERAMIDE HCL 2 MG PO CAPS: 2.0000 mg | ORAL_CAPSULE | ORAL | Status: DC | PRN

## 2016-06-11 MED ORDER — MIDAZOLAM HCL 5 MG/5ML IJ SOLN
INTRAMUSCULAR | Status: DC | PRN
  Administered 2016-06-11 (×3): 1 mg via INTRAVENOUS

## 2016-06-11 MED ORDER — ALUM & MAG HYDROXIDE-SIMETH 200-200-20 MG/5ML PO SUSP: 15.0000 mL | Freq: Four times a day (QID) | ORAL | Status: DC | PRN

## 2016-06-11 MED ORDER — ADULT MULTIVITAMIN W/MINERALS CH
1.0000 | ORAL_TABLET | Freq: Every day | ORAL | Status: DC
  Administered 2016-06-11 – 2016-06-12 (×2): 1 via ORAL
  Filled 2016-06-11 (×2): qty 1

## 2016-06-11 MED ORDER — ATORVASTATIN CALCIUM 20 MG PO TABS
20.0000 mg | ORAL_TABLET | Freq: Every day | ORAL | Status: DC
  Administered 2016-06-11: 20 mg via ORAL
  Filled 2016-06-11: qty 1

## 2016-06-11 MED ORDER — ACETAMINOPHEN 325 MG PO TABS: 325.0000 mg | ORAL_TABLET | ORAL | Status: DC | PRN

## 2016-06-11 MED ORDER — ALLOPURINOL 300 MG PO TABS
300.0000 mg | ORAL_TABLET | Freq: Every day | ORAL | Status: DC
  Administered 2016-06-11 – 2016-06-12 (×2): 300 mg via ORAL
  Filled 2016-06-11 (×2): qty 1

## 2016-06-11 MED ORDER — SODIUM CHLORIDE 0.9 % IR SOLN
80.0000 mg | Status: AC
  Administered 2016-06-11: 80 mg
  Filled 2016-06-11: qty 2

## 2016-06-11 MED ORDER — ONDANSETRON HCL 4 MG/2ML IJ SOLN: 4.0000 mg | Freq: Four times a day (QID) | INTRAMUSCULAR | Status: DC | PRN

## 2016-06-11 MED ORDER — LORATADINE 10 MG PO TABS
10.0000 mg | ORAL_TABLET | Freq: Every day | ORAL | Status: DC
  Administered 2016-06-11 – 2016-06-12 (×2): 10 mg via ORAL
  Filled 2016-06-11 (×2): qty 1

## 2016-06-11 MED ORDER — HEPARIN (PORCINE) IN NACL 2-0.9 UNIT/ML-% IJ SOLN
INTRAMUSCULAR | Status: DC | PRN
  Administered 2016-06-11: 1000 mL

## 2016-06-11 MED ORDER — GLIMEPIRIDE 4 MG PO TABS
2.0000 mg | ORAL_TABLET | Freq: Every day | ORAL | Status: DC
  Administered 2016-06-12: 2 mg via ORAL
  Filled 2016-06-11: qty 1

## 2016-06-11 MED ORDER — MIDAZOLAM HCL 5 MG/5ML IJ SOLN
INTRAMUSCULAR | Status: AC
  Filled 2016-06-11: qty 5

## 2016-06-11 MED ORDER — CEFAZOLIN SODIUM-DEXTROSE 2-4 GM/100ML-% IV SOLN
INTRAVENOUS | Status: AC
  Filled 2016-06-11: qty 100

## 2016-06-11 MED ORDER — METOPROLOL SUCCINATE ER 100 MG PO TB24
100.0000 mg | ORAL_TABLET | Freq: Two times a day (BID) | ORAL | Status: DC
  Administered 2016-06-11 – 2016-06-12 (×3): 100 mg via ORAL
  Filled 2016-06-11 (×3): qty 1

## 2016-06-11 MED ORDER — SODIUM CHLORIDE 0.9 % IV SOLN: INTRAVENOUS | Status: DC

## 2016-06-11 MED ORDER — LIDOCAINE HCL (PF) 1 % IJ SOLN
INTRAMUSCULAR | Status: AC
  Filled 2016-06-11: qty 60

## 2016-06-11 MED ORDER — ACETAMINOPHEN 500 MG PO TABS: 500.0000 mg | ORAL_TABLET | Freq: Three times a day (TID) | ORAL | Status: DC | PRN

## 2016-06-11 MED ORDER — INSULIN ASPART 100 UNIT/ML ~~LOC~~ SOLN
0.0000 [IU] | Freq: Three times a day (TID) | SUBCUTANEOUS | Status: DC
  Administered 2016-06-11 – 2016-06-12 (×2): 3 [IU] via SUBCUTANEOUS

## 2016-06-11 MED ORDER — CHLORHEXIDINE GLUCONATE 4 % EX LIQD
60.0000 mL | Freq: Once | CUTANEOUS | Status: DC
  Filled 2016-06-11: qty 60

## 2016-06-11 MED ORDER — AMIODARONE HCL 200 MG PO TABS
200.0000 mg | ORAL_TABLET | Freq: Every day | ORAL | Status: DC
  Administered 2016-06-11 – 2016-06-12 (×2): 200 mg via ORAL
  Filled 2016-06-11 (×2): qty 1

## 2016-06-11 MED ORDER — CEFAZOLIN SODIUM-DEXTROSE 2-4 GM/100ML-% IV SOLN
2.0000 g | INTRAVENOUS | Status: AC
  Administered 2016-06-11: 2 g via INTRAVENOUS
  Filled 2016-06-11: qty 100

## 2016-06-11 MED ORDER — VITAMIN C 500 MG PO TABS
500.0000 mg | ORAL_TABLET | Freq: Two times a day (BID) | ORAL | Status: DC
  Administered 2016-06-11 – 2016-06-12 (×3): 500 mg via ORAL
  Filled 2016-06-11 (×4): qty 1

## 2016-06-11 MED ORDER — ONDANSETRON HCL 4 MG PO TABS: 4.0000 mg | ORAL_TABLET | Freq: Three times a day (TID) | ORAL | Status: DC | PRN

## 2016-06-11 MED ORDER — FENTANYL CITRATE (PF) 100 MCG/2ML IJ SOLN
INTRAMUSCULAR | Status: DC | PRN
  Administered 2016-06-11: 25 ug via INTRAVENOUS
  Administered 2016-06-11: 12.5 ug via INTRAVENOUS

## 2016-06-11 SURGICAL SUPPLY — 9 items
CABLE SURGICAL S-101-97-12 (CABLE) ×3 IMPLANT
ICD VIGILANT DR D233 (Pacemaker) ×3 IMPLANT
INGEVITY MRI 7740-45CM (Lead) ×3 IMPLANT
LEAD PACING INGEVITY MRI 45CM (Lead) ×1 IMPLANT
LEAD RELIANCE G DF4 0292 (Lead) ×3 IMPLANT
PAD DEFIB LIFELINK (PAD) ×3 IMPLANT
SHEATH CLASSIC 7F (SHEATH) ×3 IMPLANT
SHEATH CLASSIC 9F (SHEATH) ×3 IMPLANT
TRAY PACEMAKER INSERTION (PACKS) ×3 IMPLANT

## 2016-06-11 NOTE — Interval H&P Note (Signed)
History and Physical Interval Note:  06/11/2016 6:57 AM  Frank Hoover  has presented today for surgery, with the diagnosis of hf  The various methods of treatment have been discussed with the patient and family. After consideration of risks, benefits and other options for treatment, the patient has consented to  Procedure(s): ICD Implant (N/A) as a surgical intervention .  The patient's history has been reviewed, patient examined, no change in status, stable for surgery.  I have reviewed the patient's chart and labs.  Questions were answered to the patient's satisfaction.     Cristopher Peru

## 2016-06-11 NOTE — H&P (View-Only) (Signed)
HPI [name] returns today for followup. He is a pleasant 66 yo man with multiple medical problems who has had both atrial flutter and SVT (AVNRT). He underwent EP study and catheter ablation of both arrhythmias several months ago and returns today for followup. In the interim, he has been found to have severe LV dysfunction without improvement on medical therapy. He has been on medical therapy but has been limited by hypotension. His EF by echo has been 30%. No syncope.  No Known Allergies   Current Outpatient Prescriptions  Medication Sig Dispense Refill  . albuterol (PROAIR HFA) 108 (90 BASE) MCG/ACT inhaler Inhale 2 puffs into the lungs every 6 (six) hours as needed for wheezing or shortness of breath.     . allopurinol (ZYLOPRIM) 300 MG tablet Take 300 mg by mouth daily.      Marland Kitchen amiodarone (PACERONE) 200 MG tablet Take 200 mg by mouth daily.    Marland Kitchen atorvastatin (LIPITOR) 20 MG tablet Take 20 mg by mouth at bedtime.    . Cholecalciferol (VITAMIN D PO) Take 1,000 Units by mouth daily.    . furosemide (LASIX) 40 MG tablet Take 1 tablet (40 mg total) by mouth 2 (two) times daily. 180 tablet 3  . gabapentin (NEURONTIN) 400 MG capsule Take 400 mg by mouth 3 (three) times daily. Takes at noon & bedtime    . glimepiride (AMARYL) 2 MG tablet Take 2 mg by mouth daily.    Marland Kitchen HYDROcodone-acetaminophen (NORCO/VICODIN) 5-325 MG tablet Take 1 tablet by mouth every 6 (six) hours as needed for severe pain. 10 tablet 0  . isosorbide mononitrate (IMDUR) 30 MG 24 hr tablet Take 1 tablet (30 mg total) by mouth daily. 90 tablet 3  . loperamide (IMODIUM) 2 MG capsule Take 2 mg by mouth daily as needed for constipation.    Marland Kitchen loratadine (CLARITIN) 10 MG tablet Take 10 mg by mouth daily.    . metoprolol succinate (TOPROL-XL) 100 MG 24 hr tablet Take 1 tablet (100 mg total) by mouth 2 (two) times daily. Take with or immediately following a meal. 60 tablet 3  . Multiple Vitamin (MULTIVITAMIN WITH MINERALS) TABS  tablet Take 1 tablet by mouth daily.    . ondansetron (ZOFRAN) 4 MG tablet Take 1 tablet (4 mg total) by mouth every 8 (eight) hours as needed for nausea or vomiting. 30 tablet 1  . pantoprazole (PROTONIX) 40 MG tablet Take 1 tablet (40 mg total) by mouth daily.    . vitamin C (ASCORBIC ACID) 500 MG tablet Take 500 mg by mouth 2 (two) times daily.     No current facility-administered medications for this visit.      Past Medical History:  Diagnosis Date  . Adenomatous colon polyp 10/30/09   Serrated adenoma removed during Colonoscopy   . Arthritis    "legs" (09/27/2015)  . Atrial flutter with rapid ventricular response ([place]) 09/27/2015  . CKD (chronic kidney disease) stage 3, GFR 30-59 ml/min   . COPD (chronic obstructive pulmonary disease) ([place])   . Diverticulosis of colon   . Dysrhythmia   . Essential hypertension   . Gastroparesis   . GERD (gastroesophageal reflux disease)   . Gout   . Hyperlipemia   . Hypertension   . OA (osteoarthritis)   . Type 2 diabetes mellitus (HCC)     ROS:   All systems reviewed and negative except as noted in the HPI.   Past Surgical History:  Procedure Laterality Date  .  CARDIOVERSION N/A 08/22/2015   Procedure: CARDIOVERSION;  Surgeon: Larey Dresser, MD;  Location: [place];  Service: Cardiovascular;  Laterality: N/A;  . CARDIOVERSION N/A 09/01/2015   Procedure: CARDIOVERSION;  Surgeon: Lelon Perla, MD;  Location: [place];  Service: Cardiovascular;  Laterality: N/A;  . CATARACT EXTRACTION W/ INTRAOCULAR LENS  IMPLANT, BILATERAL Bilateral   . COLONOSCOPY  10/30/09   EHM:[id number] diverticulum/serrated adenoma from ICV, next colonoscopy due 10/2012  . COLONOSCOPY N/A 09/18/2012   ZMO:QHUTMLY mucosa that was seen appeared normal, however most of it was not seen due to be very poor prep  . COLONOSCOPY N/A 04/08/2013   YTK:PTWSFKC polyp removed/inadequate preparation  . COLONOSCOPY N/A 06/22/2014   Procedure: COLONOSCOPY;  Surgeon:  Daneil Dolin, MD;  [facility];  Service: Endoscopy;  Laterality: N/A;  115   . ELECTROPHYSIOLOGIC STUDY N/A 09/27/2015   Procedure: SVT Ablation;  Surgeon: Evans Lance, MD;  Location: [place] [facility];  Service: Cardiovascular;  Laterality: N/A;  . ESOPHAGOGASTRODUODENOSCOPY  10/30/09   LEX:[id number]   . FLEXIBLE SIGMOIDOSCOPY N/A 08/25/2015   Procedure: FLEXIBLE SIGMOIDOSCOPY;  Surgeon: Jerene Bears, MD;  Location: [facility] ENDOSCOPY;  Service: Endoscopy;  Laterality: N/A;  . TEE WITHOUT CARDIOVERSION N/A 08/22/2015   Procedure: TRANSESOPHAGEAL ECHOCARDIOGRAM (TEE);  Surgeon: Larey Dresser, MD;  Location: [place];  Service: Cardiovascular;  Laterality: N/A;  . TEE WITHOUT CARDIOVERSION  09/27/2015   Procedure: Transesophageal Echocardiogram (Tee);  Surgeon: Evans Lance, MD;  Location: [place] [facility];  Service: Cardiovascular;;  . TONSILLECTOMY  1950s/1960s     Family History  Problem Relation Age of Onset  . COPD Father   . Diabetes Mother   . Hypertension Mother   . Colon cancer Neg Hx      Social History   Social History  . Marital status: Married    Spouse name: N/A  . Number of children: 2  . Years of education: N/A   Occupational History  . retired    Social History Main Topics  . Smoking status: Former Smoker    Packs/day: 1.00    Years: 4.00    Types: Cigarettes    Quit date: 08/26/2010  . Smokeless tobacco: Never Used  . Alcohol use No     Comment: "stopped in 2012 when I quit smoking"  . Drug use: No  . Sexual activity: Not Currently   Other Topics Concern  . Not on file   Social History Narrative  . No narrative on file     BP 96/62   Pulse (!) 58   Ht 5' 6"  (1.676 m)   Wt 220 lb 3.2 oz (99.9 kg)   SpO2 97%   BMI 35.54 kg/m   Physical Exam:  Well appearing 66 yo man, NAD HEENT: Unremarkable Neck:  6 cm JVD, no thyromegally Lymphatics:  No adenopathy Back:  No CVA tenderness Lungs:  Clear with no wheezes HEART:   Regular rate rhythm, no murmurs, no rubs, no clicks Abd:  soft, positive bowel sounds, no organomegally, no rebound, no guarding Ext:  2 plus pulses, no edema, no cyanosis, no clubbing Skin:  No rashes no nodules Neuro:  CN II through XII intact, motor grossly intact  EKG - nsr with marked first degree AV block.  Assess/Plan: 1. 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 He is stable although there is a h/o atrial fib. 2. HTN - his blood pressure is well controlled.  3. Diarrhea - this was a problem several months ago. He notes that he is down to 1-2 BM's a day. 4. Chronic systolic heart failure - he has an EF of 30% by echo. I have discussed the indications including the risks/benefits/goals/expectations of ICD insertion and he wishes to proceed with ICD insertion.  Arial Galligan,M.D.  Mikle Bosworth.D.

## 2016-06-11 NOTE — Discharge Summary (Signed)
ELECTROPHYSIOLOGY PROCEDURE DISCHARGE SUMMARY    Patient ID: Frank Hoover,  MRN: [id number], DOB/AGE: 09-05-1950 66 y.o.  Admit date: 06/11/2016 Discharge date: 06/12/2016  Primary Care Physician: Alonza Bogus, MD Primary Cardiologist: Harl Bowie Electrophysiologist: Lovena Le  Primary Discharge Diagnosis:  NICM s/p ICD implant this admission  Secondary Discharge Diagnosis:  1.  SVT s/p ablation 2.  HTN 3.  Hyperlipidemia 4.  Diabetes 5.  COPD  No Known Allergies   Procedures This Admission:  1.  Implantation of a BSX dual chamber ICD on 06/11/16 by Dr Lovena Le.  See op note for full details. There were no immediate post procedure complications. 2.  CXR on 06/12/16 demonstrated no pneumothorax status post device implantation.   Brief HPI: Frank Hoover is a 66 y.o. male was referred to electrophysiology in the outpatient setting for consideration of ICD implantation.  Past medical history includes NICM, atrial flutter, SVT.  The patient has persistent LV dysfunction despite guideline directed therapy.  Risks, benefits, and alternatives to ICD implantation were reviewed with the patient who wished to proceed.   Hospital Course:  The patient was admitted and underwent implantation of a BSX dual chamber ICD with details as outlined above. He was monitored on telemetry overnight which demonstrated sinus rhythm.  Left chest was without hematoma or ecchymosis.  The device was interrogated and found to be functioning normally.  CXR was obtained and demonstrated no pneumothorax status post device implantation.  Wound care, arm mobility, and restrictions were reviewed with the patient.  The patient was examined and considered stable for discharge to home.   Will discontinue amiodarone and follow atrial arrhythmia burden through device. If he has recurrent atrial arrhythmias, he will need to be considered for [facility] for CHADS2VASC of at least 3.   The patient's discharge medications  include a beta blocker (Metoprolol).  No ACE-I 2/2 CKD.    Physical Exam: Vitals:   06/11/16 1300 06/11/16 1400 06/11/16 2020 06/12/16 0600  BP: 119/70 124/77 110/77 103/65  Pulse:   63 67  Resp:   16 18  Temp:   98.3 F (36.8 C) 98.8 F (37.1 C)  TempSrc:   Oral Oral  SpO2:   97% 97%  Weight:      Height:        GEN- The patient is well appearing, alert and oriented x 3 today.   HEENT: normocephalic, atraumatic; sclera clear, conjunctiva pink; hearing intact; oropharynx clear; neck supple Lungs- Clear to ausculation bilaterally, normal work of breathing.  No wheezes, rales, rhonchi Heart- Regular rate and rhythm, no murmurs, rubs or gallops GI- soft, non-tender, non-distended, bowel sounds present Extremities- no clubbing, cyanosis, or edema; DP/PT/radial pulses 2+ bilaterally MS- no significant deformity or atrophy Skin- warm and dry, no rash or lesion, left chest without hematoma/ecchymosis Psych- euthymic mood, full affect Neuro- strength and sensation are intact   Labs:   Lab Results  Component Value Date   WBC 4.8 06/06/2016   HGB 10.8 (L) 10/18/2015   HCT 32.2 (L) 06/06/2016   MCV 95 06/06/2016   PLT 165 06/06/2016     Recent Labs Lab 06/06/16 1237  NA 139  K 3.6  CL 99  CO2 22  BUN 28*  CREATININE 2.64*  CALCIUM 9.8  GLUCOSE 144*    Discharge Medications:  Allergies as of 06/12/2016   No Known Allergies     Medication List    STOP taking these medications   amiodarone 200 MG tablet Commonly known  as:  PACERONE     TAKE these medications   acetaminophen 500 MG tablet Commonly known as:  TYLENOL Take 500-1,000 mg by mouth 3 (three) times daily as needed for moderate pain or headache.   allopurinol 300 MG tablet Commonly known as:  ZYLOPRIM Take 300 mg by mouth daily.   alum & mag hydroxide-simeth 200-200-20 MG/5ML suspension Commonly known as:  MAALOX/MYLANTA Take 15 mLs by mouth every 6 (six) hours as needed for indigestion or  heartburn.   atorvastatin 20 MG tablet Commonly known as:  LIPITOR Take 20 mg by mouth at bedtime.   cholecalciferol 1000 units tablet Commonly known as:  VITAMIN D Take 1,000 Units by mouth daily.   furosemide 40 MG tablet Commonly known as:  LASIX Take 1 tablet (40 mg total) by mouth 2 (two) times daily.   gabapentin 400 MG capsule Commonly known as:  NEURONTIN Take 400 mg by mouth 3 (three) times daily. Takes at noon & bedtime   glimepiride 2 MG tablet Commonly known as:  AMARYL Take 2 mg by mouth daily.   HUMALOG KWIKPEN 100 UNIT/ML KiwkPen Generic drug:  insulin lispro Inject 2-18 Units into the skin as directed. Inject 2 to 18 units based on sliding scale as directed as needed for blood glucose over 200   HYDROcodone-acetaminophen 5-325 MG tablet Commonly known as:  NORCO/VICODIN Take 1 tablet by mouth every 6 (six) hours as needed for severe pain.   isosorbide mononitrate 30 MG 24 hr tablet Commonly known as:  IMDUR Take 1 tablet (30 mg total) by mouth daily.   loperamide 2 MG capsule Commonly known as:  IMODIUM Take 2 mg by mouth daily as needed for constipation.   loratadine 10 MG tablet Commonly known as:  CLARITIN Take 10 mg by mouth daily.   metoprolol succinate 100 MG 24 hr tablet Commonly known as:  TOPROL-XL Take 1 tablet (100 mg total) by mouth 2 (two) times daily. Take with or immediately following a meal.   multivitamin with minerals Tabs tablet Take 1 tablet by mouth daily.   ondansetron 4 MG tablet Commonly known as:  ZOFRAN Take 1 tablet (4 mg total) by mouth every 8 (eight) hours as needed for nausea or vomiting.   pantoprazole 40 MG tablet Commonly known as:  PROTONIX Take 1 tablet (40 mg total) by mouth daily.   PROAIR HFA 108 (90 Base) MCG/ACT inhaler Generic drug:  albuterol Inhale 2 puffs into the lungs every 6 (six) hours as needed for wheezing or shortness of breath.   vitamin C 500 MG tablet Commonly known as:  ASCORBIC  ACID Take 500 mg by mouth 2 (two) times daily.       Disposition:  Discharge Instructions    Diet - low sodium heart healthy    Complete by:  As directed    Increase activity slowly    Complete by:  As directed      Follow-up Information    [facility] Follow up on 06/21/2016.   Specialty:  Cardiology Why:  at 11:30AM  Contact information: [address] [place]       Cristopher Peru, MD Follow up on 09/20/2016.   Specialty:  Cardiology Why:  at 8:15AM  Contact information: [place] [address] [phone]           Duration of Discharge Encounter: Greater than 30 minutes including physician time.  Signed, Chanetta Marshall, NP  06/12/2016 9:08 AM  EP Attending  Patient seen and examined. Agree with above. The patient is doing well, s/p DDD ICD insertion. He will continue his current meds and will undergo usual followup. His device is working normally.  Mikle Bosworth.D.

## 2016-06-11 NOTE — Discharge Instructions (Signed)
° ° °  Supplemental Discharge Instructions for  Pacemaker/Defibrillator Patients  Activity No heavy lifting or vigorous activity with your left/right arm for 6 to 8 weeks.  Do not raise your left/right arm above your head for one week.  Gradually raise your affected arm as drawn below.           __       06/15/16                          06/16/16                    06/17/16                  06/18/16  NO DRIVING for  1 week   ; you may begin driving on   5/68/12  .  WOUND CARE - Keep the wound area clean and dry.  Do not get this area wet for one week. No showers for one week; you may shower on  06/18/16   . - The tape/steri-strips on your wound will fall off; do not pull them off.  No bandage is needed on the site.  DO  NOT apply any creams, oils, or ointments to the wound area. - If you notice any drainage or discharge from the wound, any swelling or bruising at the site, or you develop a fever > 101? F after you are discharged home, call the office at once.  Special Instructions - You are still able to use cellular telephones; use the ear opposite the side where you have your pacemaker/defibrillator.  Avoid carrying your cellular phone near your device. - When traveling through airports, show security personnel your identification card to avoid being screened in the metal detectors.  Ask the security personnel to use the hand wand. - Avoid arc welding equipment,TENS units (transcutaneous nerve stimulators).  Call the office for questions about other devices. - Avoid electrical appliances that are in poor condition or are not properly grounded. - Microwave ovens are safe to be near or to operate.  Additional information for defibrillator patients should your device go off: - If your device goes off ONCE and you feel fine afterward, notify the device clinic nurses. - If your device goes off ONCE and you do not feel well afterward, call 911. - If your device goes off TWICE, call 911. - If your  device goes off THREE times in one day, call 911.  DO NOT DRIVE YOURSELF OR A FAMILY MEMBER WITH A DEFIBRILLATOR TO THE HOSPITAL--CALL 911.

## 2016-06-12 ENCOUNTER — Ambulatory Visit (HOSPITAL_COMMUNITY): Payer: Medicare Other

## 2016-06-12 DIAGNOSIS — M1990 Unspecified osteoarthritis, unspecified site: Secondary | ICD-10-CM | POA: Diagnosis not present

## 2016-06-12 DIAGNOSIS — R197 Diarrhea, unspecified: Secondary | ICD-10-CM | POA: Diagnosis not present

## 2016-06-12 DIAGNOSIS — E1122 Type 2 diabetes mellitus with diabetic chronic kidney disease: Secondary | ICD-10-CM | POA: Diagnosis not present

## 2016-06-12 DIAGNOSIS — I429 Cardiomyopathy, unspecified: Secondary | ICD-10-CM

## 2016-06-12 DIAGNOSIS — I4892 Unspecified atrial flutter: Secondary | ICD-10-CM | POA: Diagnosis not present

## 2016-06-12 DIAGNOSIS — E1143 Type 2 diabetes mellitus with diabetic autonomic (poly)neuropathy: Secondary | ICD-10-CM | POA: Diagnosis not present

## 2016-06-12 DIAGNOSIS — I428 Other cardiomyopathies: Secondary | ICD-10-CM | POA: Diagnosis not present

## 2016-06-12 DIAGNOSIS — E785 Hyperlipidemia, unspecified: Secondary | ICD-10-CM | POA: Diagnosis not present

## 2016-06-12 DIAGNOSIS — I959 Hypotension, unspecified: Secondary | ICD-10-CM | POA: Diagnosis not present

## 2016-06-12 DIAGNOSIS — I5022 Chronic systolic (congestive) heart failure: Secondary | ICD-10-CM | POA: Diagnosis not present

## 2016-06-12 DIAGNOSIS — J449 Chronic obstructive pulmonary disease, unspecified: Secondary | ICD-10-CM | POA: Diagnosis not present

## 2016-06-12 DIAGNOSIS — I130 Hypertensive heart and chronic kidney disease with heart failure and stage 1 through stage 4 chronic kidney disease, or unspecified chronic kidney disease: Secondary | ICD-10-CM | POA: Diagnosis not present

## 2016-06-12 DIAGNOSIS — M109 Gout, unspecified: Secondary | ICD-10-CM | POA: Diagnosis not present

## 2016-06-12 DIAGNOSIS — Z794 Long term (current) use of insulin: Secondary | ICD-10-CM | POA: Diagnosis not present

## 2016-06-12 DIAGNOSIS — N183 Chronic kidney disease, stage 3 (moderate): Secondary | ICD-10-CM | POA: Diagnosis not present

## 2016-06-12 DIAGNOSIS — I499 Cardiac arrhythmia, unspecified: Secondary | ICD-10-CM | POA: Diagnosis not present

## 2016-06-12 DIAGNOSIS — K3184 Gastroparesis: Secondary | ICD-10-CM | POA: Diagnosis not present

## 2016-06-12 DIAGNOSIS — K219 Gastro-esophageal reflux disease without esophagitis: Secondary | ICD-10-CM | POA: Diagnosis not present

## 2016-06-12 DIAGNOSIS — Z87891 Personal history of nicotine dependence: Secondary | ICD-10-CM | POA: Diagnosis not present

## 2016-06-12 DIAGNOSIS — I471 Supraventricular tachycardia: Secondary | ICD-10-CM | POA: Diagnosis not present

## 2016-06-12 LAB — GLUCOSE, CAPILLARY
Glucose-Capillary: 107 mg/dL — ABNORMAL HIGH (ref 65–99)
Glucose-Capillary: 154 mg/dL — ABNORMAL HIGH (ref 65–99)

## 2016-06-12 NOTE — Progress Notes (Signed)
Discharge instructions given. Pt verbalized understanding and all questions were answered.  

## 2016-06-21 ENCOUNTER — Ambulatory Visit (INDEPENDENT_AMBULATORY_CARE_PROVIDER_SITE_OTHER): Payer: Medicare Other | Admitting: *Deleted

## 2016-06-21 DIAGNOSIS — I5022 Chronic systolic (congestive) heart failure: Secondary | ICD-10-CM

## 2016-06-21 DIAGNOSIS — Z95810 Presence of automatic (implantable) cardiac defibrillator: Secondary | ICD-10-CM

## 2016-06-21 LAB — CUP PACEART INCLINIC DEVICE CHECK
Brady Statistic RA Percent Paced: 1 % — CL
Brady Statistic RV Percent Paced: 12 %
Date Time Interrogation Session: 20180420040000
HighPow Impedance: 54 Ohm
HighPow Impedance: 55 Ohm
Implantable Lead Implant Date: 20180410
Implantable Lead Implant Date: 20180410
Implantable Lead Location: 753859
Implantable Lead Location: 753860
Implantable Lead Model: 292
Implantable Lead Model: 7740
Implantable Lead Serial Number: 420134
Implantable Lead Serial Number: 693412
Implantable Pulse Generator Implant Date: 20180410
Lead Channel Impedance Value: 405 Ohm
Lead Channel Impedance Value: 665 Ohm
Lead Channel Pacing Threshold Amplitude: 1 V
Lead Channel Pacing Threshold Amplitude: 1.4 V
Lead Channel Pacing Threshold Pulse Width: 0.4 ms
Lead Channel Pacing Threshold Pulse Width: 0.4 ms
Lead Channel Sensing Intrinsic Amplitude: 11.1 mV
Lead Channel Sensing Intrinsic Amplitude: 6.5 mV
Lead Channel Setting Pacing Amplitude: 3.5 V
Lead Channel Setting Pacing Amplitude: 3.5 V
Lead Channel Setting Pacing Pulse Width: 0.4 ms
Lead Channel Setting Sensing Sensitivity: 0.5 mV
Pulse Gen Serial Number: 533916

## 2016-06-21 NOTE — Progress Notes (Signed)
Wound check appointment. Steri-strips removed. Wound without redness or edema. Incision edges approximated, wound well healed. Normal device function. Thresholds, sensing, and impedances consistent with implant measurements. Device programmed at 3.5V for extra safety margin until 3 month visit. Histogram distribution appropriate for patient and level of activity. No mode switches or ventricular arrhythmias noted. Patient educated about wound care, arm mobility, lifting restrictions, shock plan. Latitude education provided. ROV with GT 09/20/16.

## 2016-06-24 DIAGNOSIS — R0902 Hypoxemia: Secondary | ICD-10-CM | POA: Diagnosis not present

## 2016-06-24 DIAGNOSIS — G4733 Obstructive sleep apnea (adult) (pediatric): Secondary | ICD-10-CM | POA: Diagnosis not present

## 2016-07-01 DIAGNOSIS — J449 Chronic obstructive pulmonary disease, unspecified: Secondary | ICD-10-CM | POA: Diagnosis not present

## 2016-07-01 DIAGNOSIS — E1140 Type 2 diabetes mellitus with diabetic neuropathy, unspecified: Secondary | ICD-10-CM | POA: Diagnosis not present

## 2016-07-01 DIAGNOSIS — N184 Chronic kidney disease, stage 4 (severe): Secondary | ICD-10-CM | POA: Diagnosis not present

## 2016-07-01 DIAGNOSIS — M6281 Muscle weakness (generalized): Secondary | ICD-10-CM | POA: Diagnosis not present

## 2016-07-01 DIAGNOSIS — I129 Hypertensive chronic kidney disease with stage 1 through stage 4 chronic kidney disease, or unspecified chronic kidney disease: Secondary | ICD-10-CM | POA: Diagnosis not present

## 2016-07-01 DIAGNOSIS — R2681 Unsteadiness on feet: Secondary | ICD-10-CM | POA: Diagnosis not present

## 2016-07-01 DIAGNOSIS — L89159 Pressure ulcer of sacral region, unspecified stage: Secondary | ICD-10-CM | POA: Diagnosis not present

## 2016-07-01 DIAGNOSIS — I2510 Atherosclerotic heart disease of native coronary artery without angina pectoris: Secondary | ICD-10-CM | POA: Diagnosis not present

## 2016-07-08 ENCOUNTER — Other Ambulatory Visit: Payer: Self-pay | Admitting: Gastroenterology

## 2016-07-08 ENCOUNTER — Other Ambulatory Visit: Payer: Self-pay

## 2016-07-08 NOTE — Patient Outreach (Signed)
[facility]) Care Management  07/08/2016  [name] 1950/10/31 [id number]     Medication Adherence to [name]  Call Frank Hoover because he is due for his lisinopril 2.5 Frank Hoover aid said he is no longer on medication the doctor took him off about four month ago.    [facility] Direct Dial [phone]  Fax [phone] [email or web address]

## 2016-07-24 DIAGNOSIS — Z79899 Other long term (current) drug therapy: Secondary | ICD-10-CM | POA: Diagnosis not present

## 2016-07-24 DIAGNOSIS — E559 Vitamin D deficiency, unspecified: Secondary | ICD-10-CM | POA: Diagnosis not present

## 2016-07-24 DIAGNOSIS — I10 Essential (primary) hypertension: Secondary | ICD-10-CM | POA: Diagnosis not present

## 2016-07-24 DIAGNOSIS — G4733 Obstructive sleep apnea (adult) (pediatric): Secondary | ICD-10-CM | POA: Diagnosis not present

## 2016-07-24 DIAGNOSIS — R0902 Hypoxemia: Secondary | ICD-10-CM | POA: Diagnosis not present

## 2016-07-24 DIAGNOSIS — D509 Iron deficiency anemia, unspecified: Secondary | ICD-10-CM | POA: Diagnosis not present

## 2016-07-24 DIAGNOSIS — N183 Chronic kidney disease, stage 3 (moderate): Secondary | ICD-10-CM | POA: Diagnosis not present

## 2016-07-31 DIAGNOSIS — N184 Chronic kidney disease, stage 4 (severe): Secondary | ICD-10-CM | POA: Diagnosis not present

## 2016-07-31 DIAGNOSIS — D638 Anemia in other chronic diseases classified elsewhere: Secondary | ICD-10-CM | POA: Diagnosis not present

## 2016-07-31 DIAGNOSIS — R809 Proteinuria, unspecified: Secondary | ICD-10-CM | POA: Diagnosis not present

## 2016-08-01 DIAGNOSIS — I5021 Acute systolic (congestive) heart failure: Secondary | ICD-10-CM | POA: Diagnosis not present

## 2016-08-01 DIAGNOSIS — J449 Chronic obstructive pulmonary disease, unspecified: Secondary | ICD-10-CM | POA: Diagnosis not present

## 2016-08-01 DIAGNOSIS — M6281 Muscle weakness (generalized): Secondary | ICD-10-CM | POA: Diagnosis not present

## 2016-08-01 DIAGNOSIS — L89159 Pressure ulcer of sacral region, unspecified stage: Secondary | ICD-10-CM | POA: Diagnosis not present

## 2016-08-01 DIAGNOSIS — R2681 Unsteadiness on feet: Secondary | ICD-10-CM | POA: Diagnosis not present

## 2016-08-01 DIAGNOSIS — I2510 Atherosclerotic heart disease of native coronary artery without angina pectoris: Secondary | ICD-10-CM | POA: Diagnosis not present

## 2016-08-15 DIAGNOSIS — M79676 Pain in unspecified toe(s): Secondary | ICD-10-CM | POA: Diagnosis not present

## 2016-08-15 DIAGNOSIS — B351 Tinea unguium: Secondary | ICD-10-CM | POA: Diagnosis not present

## 2016-08-15 DIAGNOSIS — L84 Corns and callosities: Secondary | ICD-10-CM | POA: Diagnosis not present

## 2016-08-15 DIAGNOSIS — E1142 Type 2 diabetes mellitus with diabetic polyneuropathy: Secondary | ICD-10-CM | POA: Diagnosis not present

## 2016-08-23 ENCOUNTER — Ambulatory Visit (INDEPENDENT_AMBULATORY_CARE_PROVIDER_SITE_OTHER): Payer: Medicare Other | Admitting: Adult Health

## 2016-08-23 ENCOUNTER — Encounter: Payer: Self-pay | Admitting: Adult Health

## 2016-08-23 VITALS — BP 118/75 | HR 55 | Ht 66.0 in | Wt 218.0 lb

## 2016-08-23 DIAGNOSIS — I4892 Unspecified atrial flutter: Secondary | ICD-10-CM

## 2016-08-23 DIAGNOSIS — I5022 Chronic systolic (congestive) heart failure: Secondary | ICD-10-CM | POA: Diagnosis not present

## 2016-08-23 DIAGNOSIS — Z95810 Presence of automatic (implantable) cardiac defibrillator: Secondary | ICD-10-CM | POA: Diagnosis not present

## 2016-08-23 DIAGNOSIS — N182 Chronic kidney disease, stage 2 (mild): Secondary | ICD-10-CM | POA: Diagnosis not present

## 2016-08-23 NOTE — Patient Instructions (Signed)
Your physician wants you to follow-up in:  6 months with Dr Bryna Colander will receive a reminder letter in the mail two months in advance. If you don't receive a letter, please call our office to schedule the follow-up appointment.     Your physician recommends that you continue on your current medications as directed. Please refer to the Current Medication list given to you today.    If you need a refill on your cardiac medications before your next appointment, please call your pharmacy.     No testing or lab work today      Thank you for choosing [facility] !

## 2016-08-23 NOTE — Progress Notes (Signed)
Cardiology Office Note   Date:  08/23/2016   ID:  Frank Hoover, DOB 07-30-1950, MRN [id number]  PCP:  Sinda Du, MD  Cardiologist:  [facility] Chief Complaint  Patient presents with  . Hospitalization Follow-up  . Atrial Fibrillation      History of Present Illness: Frank Hoover is a 66 y.o. male who presents for ongoing assessment and management of atrial flutter, chronic systolic heart failure with most recent ejection fraction of 25% per echocardiogram in June 2017, moderate RV dysfunction. Hypertension, and chronic kidney disease followed by nephrology.   The patient comes today post hospitalization follow-up after undergoing SVT ablation, and implantation of BS X dual-chamber ICD. Amiodarone was discontinued. He is continued on metoprolol. He does have CHADS VASC Score of 3, and has not yet been placed on anticoagulation therapy.  Frank Hoover is doing very well. He denies any complaints at the pacemaker site, racing heart rate, nausea vomiting or dizziness. He is breathing much better. Lower extremity edema has improved. He continues to wear support hose. He has a niece who is with him who is very attentive.  Past Medical History:  Diagnosis Date  . Adenomatous colon polyp 10/30/09   Serrated adenoma removed during Colonoscopy   . AICD (automatic cardioverter/defibrillator) present 06/11/2016  . Arthritis    "legs" (09/27/2015)  . Atrial flutter with rapid ventricular response ([place]) 09/27/2015  . CHF (congestive heart failure) ([place])   . CKD (chronic kidney disease) stage 3, GFR 30-59 ml/min   . COPD (chronic obstructive pulmonary disease) ([place])   . Diverticulosis of colon   . Dysrhythmia   . Essential hypertension   . Gastroparesis   . GERD (gastroesophageal reflux disease)   . Gout   . Hyperlipemia   . Hypertension   . OA (osteoarthritis)   . Type 2 diabetes mellitus ([place])     Past Surgical History:  Procedure Laterality Date  . CARDIOVERSION N/A 08/22/2015     Procedure: CARDIOVERSION;  Surgeon: Larey Dresser, MD;  Location: [facility];  Service: Cardiovascular;  Laterality: N/A;  . CARDIOVERSION N/A 09/01/2015   Procedure: CARDIOVERSION;  Surgeon: Lelon Perla, MD;  Location: [facility];  Service: Cardiovascular;  Laterality: N/A;  . CATARACT EXTRACTION W/ INTRAOCULAR LENS  IMPLANT, BILATERAL Bilateral   . COLONOSCOPY  10/30/09   TIR:[id number]-VQMGQ diverticulum/serrated adenoma from ICV, next colonoscopy due 10/2012  . COLONOSCOPY N/A 09/18/2012   QPY:PPJKDTO mucosa that was seen appeared normal, however most of it was not seen due to be very poor prep  . COLONOSCOPY N/A 04/08/2013   IZT:IWPYKDX polyp removed/inadequate preparation  . COLONOSCOPY N/A 06/22/2014   Procedure: COLONOSCOPY;  Surgeon: Daneil Dolin, MD;  [facility];  Service: Endoscopy;  Laterality: N/A;  115   . ELECTROPHYSIOLOGIC STUDY N/A 09/27/2015   Procedure: SVT Ablation;  Surgeon: Evans Lance, MD;  Location: [facility] CV LAB;  Service: Cardiovascular;  Laterality: N/A;  . EP IMPLANTABLE DEVICE  06/11/2016  . ESOPHAGOGASTRODUODENOSCOPY  10/30/09   IPJ:ASNKNL   . FLEXIBLE SIGMOIDOSCOPY N/A 08/25/2015   Procedure: FLEXIBLE SIGMOIDOSCOPY;  Surgeon: Jerene Bears, MD;  Location: [facility] ENDOSCOPY;  Service: Endoscopy;  Laterality: N/A;  . ICD IMPLANT N/A 06/11/2016   Procedure: ICD Implant;  Surgeon: Evans Lance, MD;  Location: [facility] CV LAB;  Service: Cardiovascular;  Laterality: N/A;  . TEE WITHOUT CARDIOVERSION N/A 08/22/2015   Procedure: TRANSESOPHAGEAL ECHOCARDIOGRAM (TEE);  Surgeon: Larey Dresser, MD;  Location: [facility];  Service: Cardiovascular;  Laterality: N/A;  . TEE WITHOUT CARDIOVERSION  09/27/2015   Procedure: Transesophageal Echocardiogram (Tee);  Surgeon: Evans Lance, MD;  Location: [facility] CV LAB;  Service: Cardiovascular;;  . TONSILLECTOMY  1950s/1960s     Current Outpatient Prescriptions  Medication Sig Dispense Refill  .  acetaminophen (TYLENOL) 500 MG tablet Take 500-1,000 mg by mouth 3 (three) times daily as needed for moderate pain or headache.    . albuterol (PROAIR HFA) 108 (90 BASE) MCG/ACT inhaler Inhale 2 puffs into the lungs every 6 (six) hours as needed for wheezing or shortness of breath.     . allopurinol (ZYLOPRIM) 300 MG tablet Take 300 mg by mouth daily.      Marland Kitchen alum & mag hydroxide-simeth (MAALOX/MYLANTA) 200-200-20 MG/5ML suspension Take 15 mLs by mouth every 6 (six) hours as needed for indigestion or heartburn.    Marland Kitchen atorvastatin (LIPITOR) 20 MG tablet Take 20 mg by mouth at bedtime.    . cholecalciferol (VITAMIN D) 1000 units tablet Take 1,000 Units by mouth daily.    . furosemide (LASIX) 40 MG tablet Take 1 tablet (40 mg total) by mouth 2 (two) times daily. 180 tablet 3  . gabapentin (NEURONTIN) 400 MG capsule Take 400 mg by mouth 3 (three) times daily. Takes at noon & bedtime    . glimepiride (AMARYL) 2 MG tablet Take 2 mg by mouth daily.    Marland Kitchen HYDROcodone-acetaminophen (NORCO/VICODIN) 5-325 MG tablet Take 1 tablet by mouth every 6 (six) hours as needed for severe pain. 10 tablet 0  . insulin lispro (HUMALOG KWIKPEN) 100 UNIT/ML KiwkPen Inject 2-18 Units into the skin as directed. Inject 2 to 18 units based on sliding scale as directed as needed for blood glucose over 200    . isosorbide mononitrate (IMDUR) 30 MG 24 hr tablet Take 1 tablet (30 mg total) by mouth daily. 90 tablet 3  . LINZESS 290 MCG CAPS capsule TAKE 1 CAPSULE ONCE DAILY 30 MINUTES BEFORE BREAKFAST 90 capsule 1  . loperamide (IMODIUM) 2 MG capsule Take 2 mg by mouth daily as needed for constipation.    Marland Kitchen loratadine (CLARITIN) 10 MG tablet Take 10 mg by mouth daily.    . metoprolol succinate (TOPROL-XL) 100 MG 24 hr tablet Take 1 tablet (100 mg total) by mouth 2 (two) times daily. Take with or immediately following a meal. 60 tablet 3  . Multiple Vitamin (MULTIVITAMIN WITH MINERALS) TABS tablet Take 1 tablet by mouth daily.    .  ondansetron (ZOFRAN) 4 MG tablet Take 1 tablet (4 mg total) by mouth every 8 (eight) hours as needed for nausea or vomiting. 30 tablet 1  . pantoprazole (PROTONIX) 40 MG tablet Take 1 tablet (40 mg total) by mouth daily.    . vitamin C (ASCORBIC ACID) 500 MG tablet Take 500 mg by mouth 2 (two) times daily.     No current facility-administered medications for this visit.     Allergies:   Patient has no known allergies.    Social History:  The patient  reports that he quit smoking about 5 years ago. His smoking use included Cigarettes. He has a 4.00 pack-year smoking history. He has never used smokeless tobacco. He reports that he does not drink alcohol or use drugs.   Family History:  The patient's family history includes COPD in his father; Diabetes in his mother; Hypertension in his mother.    ROS: All other systems are reviewed and negative. Unless otherwise mentioned in  H&P    PHYSICAL EXAM: VS:  BP 118/75   Pulse (!) 55   Ht 5\' 6"  (1.676 m)   Wt 218 lb (98.9 kg)   BMI 35.19 kg/m  , BMI Body mass index is 35.19 kg/m. GEN: Well nourished, well developed, in no acute distress  HEENT: normal  Neck: no JVD, carotid bruits, or masses Cardiac: RRR; no murmurs, rubs, or gallops,no edema  Respiratory:  clear to auscultation bilaterally, normal work of breathing GI: soft, nontender, nondistended, + BS MS: no deformity or atrophy pacemaker incision site and left upper chest at collarbone is well healed without evidence of bleeding hematoma or signs of infection. Skin: warm and dry, no rash Neuro:  Strength and sensation are intact Psych: euthymic mood, full affect    Recent Labs: 09/06/2015: ALT 17 10/18/2015: Magnesium 1.7 06/06/2016: BUN 28; Creatinine, Ser 2.64; Hemoglobin 10.7; Platelets 165; Potassium 3.6; Sodium 139    Lipid Panel    Component Value Date/Time   CHOL 79 08/15/2015 0422   TRIG 38 08/15/2015 0422   HDL 34 (L) 08/15/2015 0422   CHOLHDL 2.3 08/15/2015 0422    VLDL 8 08/15/2015 0422   LDLCALC 37 08/15/2015 0422      Wt Readings from Last 3 Encounters:  08/23/16 218 lb (98.9 kg)  06/11/16 207 lb (93.9 kg)  06/06/16 220 lb 3.2 oz (99.9 kg)     ASSESSMENT AND PLAN:  1. Nonischemic cardiomyopathy: Most recent echocardiogram dated 04/01/2016 revealed LVEF of 30-35%. The patient is doing well, there is no evidence of decompensated heart failure, his weight has dropped 8 pounds since being seen last. He is medically compliant. He has good family support with his niece who is very attentive.  He will continue metoprolol 100 mg XL twice a day, isosorbide 30 mg daily, and furosemide 40 mg twice a day.  2. Status post ICD implantation: [place]. He is doing well, no issues with pacemaker at all. Sinus well healed. He has a follow-up appointment with Dr. Lovena Le on 09/28/2016. He is reminded of this and is planning on keeping the appointment. Follow-up remote checks per protocol.  3. History of atrial flutter: Would not place patient on anticoagulation at this time. Due to chronic kidney disease, if started, the patient will be placed on low-dose ELIQUIS 2.5 mg twice a day versus Xarelto 15 mg grams daily. He denies any rapid heart rhythm irregular heart rhythm is being seen last. Amiodarone has been discontinued with improvement in blood pressure.  4. Chronic kidney disease stage III: Follow-up with nephrology.    Current medicines are reviewed at length with the patient today.    Labs/ tests ordered today include:  Phill Myron. [place] Pugh, ANP, AACC   08/23/2016 1:41 PM    [facility] [address] [place], [place] [address] Phone: [phone]; Fax: [phone]

## 2016-08-24 DIAGNOSIS — G4733 Obstructive sleep apnea (adult) (pediatric): Secondary | ICD-10-CM | POA: Diagnosis not present

## 2016-08-24 DIAGNOSIS — R0902 Hypoxemia: Secondary | ICD-10-CM | POA: Diagnosis not present

## 2016-08-29 DIAGNOSIS — E1151 Type 2 diabetes mellitus with diabetic peripheral angiopathy without gangrene: Secondary | ICD-10-CM | POA: Diagnosis not present

## 2016-08-29 DIAGNOSIS — G4733 Obstructive sleep apnea (adult) (pediatric): Secondary | ICD-10-CM | POA: Diagnosis not present

## 2016-08-29 DIAGNOSIS — N184 Chronic kidney disease, stage 4 (severe): Secondary | ICD-10-CM | POA: Diagnosis not present

## 2016-08-29 DIAGNOSIS — I110 Hypertensive heart disease with heart failure: Secondary | ICD-10-CM | POA: Diagnosis not present

## 2016-08-31 DIAGNOSIS — I2510 Atherosclerotic heart disease of native coronary artery without angina pectoris: Secondary | ICD-10-CM | POA: Diagnosis not present

## 2016-08-31 DIAGNOSIS — L89159 Pressure ulcer of sacral region, unspecified stage: Secondary | ICD-10-CM | POA: Diagnosis not present

## 2016-08-31 DIAGNOSIS — J449 Chronic obstructive pulmonary disease, unspecified: Secondary | ICD-10-CM | POA: Diagnosis not present

## 2016-08-31 DIAGNOSIS — R2681 Unsteadiness on feet: Secondary | ICD-10-CM | POA: Diagnosis not present

## 2016-08-31 DIAGNOSIS — M6281 Muscle weakness (generalized): Secondary | ICD-10-CM | POA: Diagnosis not present

## 2016-09-10 ENCOUNTER — Encounter: Payer: Medicare Other | Admitting: Internal Medicine

## 2016-09-20 ENCOUNTER — Ambulatory Visit (INDEPENDENT_AMBULATORY_CARE_PROVIDER_SITE_OTHER): Payer: Medicare Other | Admitting: Internal Medicine

## 2016-09-20 ENCOUNTER — Encounter: Payer: Self-pay | Admitting: Internal Medicine

## 2016-09-20 VITALS — BP 140/90 | HR 68 | Ht 66.0 in | Wt 218.0 lb

## 2016-09-20 DIAGNOSIS — Z95810 Presence of automatic (implantable) cardiac defibrillator: Secondary | ICD-10-CM

## 2016-09-20 DIAGNOSIS — I5022 Chronic systolic (congestive) heart failure: Secondary | ICD-10-CM | POA: Diagnosis not present

## 2016-09-20 NOTE — Progress Notes (Signed)
HPI Frank Hoover returns today for followup. He is a pleasant 66 yo man with multiple medical problems who has had both atrial flutter and SVT (AVNRT). He underwent EP study and catheter ablation of both arrhythmias several months ago and then ICD insertion due to severe LV dysfunction. In the interim, he has done well with no chest pain or sob. He notes that his BP has been up a bit since his device was placed. No edema.  Current Outpatient Prescriptions  Medication Sig Dispense Refill  . acetaminophen (TYLENOL) 500 MG tablet Take 500-1,000 mg by mouth 3 (three) times daily as needed for moderate pain or headache.    . albuterol (PROAIR HFA) 108 (90 BASE) MCG/ACT inhaler Inhale 2 puffs into the lungs every 6 (six) hours as needed for wheezing or shortness of breath.     . allopurinol (ZYLOPRIM) 300 MG tablet Take 300 mg by mouth daily.      Marland Kitchen alum & mag hydroxide-simeth (MAALOX/MYLANTA) 200-200-20 MG/5ML suspension Take 15 mLs by mouth every 6 (six) hours as needed for indigestion or heartburn.    Marland Kitchen atorvastatin (LIPITOR) 20 MG tablet Take 20 mg by mouth at bedtime.    . cholecalciferol (VITAMIN D) 1000 units tablet Take 1,000 Units by mouth daily.    . furosemide (LASIX) 40 MG tablet Take 1 tablet (40 mg total) by mouth 2 (two) times daily. 180 tablet 3  . gabapentin (NEURONTIN) 400 MG capsule Take 400 mg by mouth 3 (three) times daily. Takes at noon & bedtime    . glimepiride (AMARYL) 2 MG tablet Take 2 mg by mouth daily.    Marland Kitchen HYDROcodone-acetaminophen (NORCO/VICODIN) 5-325 MG tablet Take 1 tablet by mouth every 6 (six) hours as needed for severe pain. 10 tablet 0  . insulin lispro (HUMALOG KWIKPEN) 100 UNIT/ML KiwkPen Inject 2-18 Units into the skin as directed. Inject 2 to 18 units based on sliding scale as directed as needed for blood glucose over 200    . isosorbide mononitrate (IMDUR) 30 MG 24 hr tablet Take 1 tablet (30 mg total) by mouth daily. 90 tablet 3  . LINZESS 290 MCG CAPS  capsule TAKE 1 CAPSULE ONCE DAILY 30 MINUTES BEFORE BREAKFAST 90 capsule 1  . loperamide (IMODIUM) 2 MG capsule Take 2 mg by mouth daily as needed for constipation.    Marland Kitchen loratadine (CLARITIN) 10 MG tablet Take 10 mg by mouth daily.    . metoprolol succinate (TOPROL-XL) 100 MG 24 hr tablet Take 1 tablet (100 mg total) by mouth 2 (two) times daily. Take with or immediately following a meal. 60 tablet 3  . Multiple Vitamin (MULTIVITAMIN WITH MINERALS) TABS tablet Take 1 tablet by mouth daily.    . ondansetron (ZOFRAN) 4 MG tablet Take 1 tablet (4 mg total) by mouth every 8 (eight) hours as needed for nausea or vomiting. 30 tablet 1  . pantoprazole (PROTONIX) 40 MG tablet Take 1 tablet (40 mg total) by mouth daily.    . vitamin C (ASCORBIC ACID) 500 MG tablet Take 500 mg by mouth 2 (two) times daily.     No current facility-administered medications for this visit.      Past Medical History:  Diagnosis Date  . Adenomatous colon polyp 10/30/09   Serrated adenoma removed during Colonoscopy   . AICD (automatic cardioverter/defibrillator) present 06/11/2016  . Arthritis    "legs" (09/27/2015)  . Atrial flutter with rapid ventricular response ([place]) 09/27/2015  . CHF (congestive heart  failure) ([place])   . CKD (chronic kidney disease) stage 3, GFR 30-59 ml/min   . COPD (chronic obstructive pulmonary disease) ([place])   . Diverticulosis of colon   . Dysrhythmia   . Essential hypertension   . Gastroparesis   . GERD (gastroesophageal reflux disease)   . Gout   . Hyperlipemia   . Hypertension   . OA (osteoarthritis)   . Type 2 diabetes mellitus (HCC)     ROS:   All systems reviewed and negative except as noted in the HPI.   Past Surgical History:  Procedure Laterality Date  . CARDIOVERSION N/A 08/22/2015   Procedure: CARDIOVERSION;  Surgeon: Larey Dresser, MD;  Location: [facility];  Service: Cardiovascular;  Laterality: N/A;  . CARDIOVERSION N/A 09/01/2015   Procedure: CARDIOVERSION;   Surgeon: Lelon Perla, MD;  Location: [facility];  Service: Cardiovascular;  Laterality: N/A;  . CATARACT EXTRACTION W/ INTRAOCULAR LENS  IMPLANT, BILATERAL Bilateral   . COLONOSCOPY  10/30/09   CMK:[id number] diverticulum/serrated adenoma from ICV, next colonoscopy due 10/2012  . COLONOSCOPY N/A 09/18/2012   VWP:VXYIAXK mucosa that was seen appeared normal, however most of it was not seen due to be very poor prep  . COLONOSCOPY N/A 04/08/2013   PVV:ZSMOLMB polyp removed/inadequate preparation  . COLONOSCOPY N/A 06/22/2014   Procedure: COLONOSCOPY;  Surgeon: Daneil Dolin, MD;  [facility];  Service: Endoscopy;  Laterality: N/A;  115   . ELECTROPHYSIOLOGIC STUDY N/A 09/27/2015   Procedure: SVT Ablation;  Surgeon: Evans Lance, MD;  Location: [place] [facility];  Service: Cardiovascular;  Laterality: N/A;  . EP IMPLANTABLE DEVICE  06/11/2016  . ESOPHAGOGASTRODUODENOSCOPY  10/30/09   EML:JQGBEE   . FLEXIBLE SIGMOIDOSCOPY N/A 08/25/2015   Procedure: FLEXIBLE SIGMOIDOSCOPY;  Surgeon: Jerene Bears, MD;  Location: [facility];  Service: Endoscopy;  Laterality: N/A;  . ICD IMPLANT N/A 06/11/2016   Procedure: ICD Implant;  Surgeon: Evans Lance, MD;  Location: [place] [facility];  Service: Cardiovascular;  Laterality: N/A;  . TEE WITHOUT CARDIOVERSION N/A 08/22/2015   Procedure: TRANSESOPHAGEAL ECHOCARDIOGRAM (TEE);  Surgeon: Larey Dresser, MD;  Location: [place];  Service: Cardiovascular;  Laterality: N/A;  . TEE WITHOUT CARDIOVERSION  09/27/2015   Procedure: Transesophageal Echocardiogram (Tee);  Surgeon: Evans Lance, MD;  Location: [place] [facility];  Service: Cardiovascular;;  . TONSILLECTOMY  1950s/1960s     Family History  Problem Relation Age of Onset  . COPD Father   . Diabetes Mother   . Hypertension Mother   . Colon cancer Neg Hx      Social History   Social History  . Marital status: Married    Spouse name: N/A  . Number of children: 2  . Years of  education: N/A   Occupational History  . retired    Social History Main Topics  . Smoking status: Former Smoker    Packs/day: 1.00    Years: 4.00    Types: Cigarettes    Quit date: 08/26/2010  . Smokeless tobacco: Never Used  . Alcohol use No     Comment: "stopped in 2012 when I quit smoking"  . Drug use: No  . Sexual activity: Not Currently   Other Topics Concern  . Not on file   Social History Narrative  . No narrative on file     BP 140/90   Pulse 68   Ht 5' 6"  (1.676 m)   Wt 218 lb (98.9 kg)  SpO2 99% Comment: on room air  BMI 35.19 kg/m   Physical Exam:  Well appearing 66 yo man, NAD HEENT: Unremarkable Neck:  6 cm JVD, no thyromegally Lymphatics:  No adenopathy Back:  No CVA tenderness Lungs:  Clear with no wheezes, well healed ICD incision HEART:  Regular rate rhythm, no murmurs, no rubs, no clicks Abd:  soft, positive bowel sounds, no organomegally, no rebound, no guarding Ext:  2 plus pulses, trace peripheral edema, no cyanosis, no clubbing Skin:  No rashes no nodules Neuro:  CN II through XII intact, motor grossly intact  Device interogation - no VT. Normal [place] Sci DDD ICD function  Assess/Plan: 1. ICD - his [place] Sci DDD ICD is working normally. Will recheck in several months. 2. HTN - he did not take his BP meds this morning. I have considered switching him to coreg but for now will hold off. If his pressures remain high then we would consider switching.  3. Chronic systolic heart failure - he has an EF of 30% by echo. His symptoms remain class 2. He will continue his current meds. 4. SVT - he has had none since his ablation.  Mikle Bosworth.D.

## 2016-09-20 NOTE — Patient Instructions (Signed)
Medication Instructions:  Your physician recommends that you continue on your current medications as directed. Please refer to the Current Medication list given to you today.   Labwork: NONE  Testing/Procedures: Remote monitoring is used to monitor your Pacemaker of ICD from home. This monitoring reduces the number of office visits required to check your device to one time per year. It allows us to keep an eye on the functioning of your device to ensure it is working properly. You are scheduled for a device check from home on  12/23/2016. You may send your transmission at any time that day. If you have a wireless device, the transmission will be sent automatically. After your physician reviews your transmission, you will receive a postcard with your next transmission date.     Follow-Up: Your physician wants you to follow-up in: 1 YEAR WITH DR. TAYLOR.  You will receive a reminder letter in the mail two months in advance. If you don't receive a letter, please call our office to schedule the follow-up appointment.   Any Other Special Instructions Will Be Listed Below (If Applicable).     If you need a refill on your cardiac medications before your next appointment, please call your pharmacy.   

## 2016-09-23 DIAGNOSIS — G4733 Obstructive sleep apnea (adult) (pediatric): Secondary | ICD-10-CM | POA: Diagnosis not present

## 2016-09-23 DIAGNOSIS — R0902 Hypoxemia: Secondary | ICD-10-CM | POA: Diagnosis not present

## 2016-10-10 ENCOUNTER — Telehealth: Payer: Self-pay | Admitting: *Deleted

## 2016-10-10 NOTE — Telephone Encounter (Signed)
Fax received this morning requesting clarification on furosemide. Pharmacy states they have been packing furosemide 80 mg 1/2 tablet BID but the patient's aide said that our office stopped this medication.   After review of the chart, patient should be taking furosemide 40 mg BID and pharmacist notified.

## 2016-10-11 DIAGNOSIS — G4733 Obstructive sleep apnea (adult) (pediatric): Secondary | ICD-10-CM | POA: Diagnosis not present

## 2016-10-24 DIAGNOSIS — E119 Type 2 diabetes mellitus without complications: Secondary | ICD-10-CM | POA: Diagnosis not present

## 2016-10-24 DIAGNOSIS — G4733 Obstructive sleep apnea (adult) (pediatric): Secondary | ICD-10-CM | POA: Diagnosis not present

## 2016-10-24 DIAGNOSIS — I998 Other disorder of circulatory system: Secondary | ICD-10-CM | POA: Diagnosis not present

## 2016-10-24 DIAGNOSIS — R0902 Hypoxemia: Secondary | ICD-10-CM | POA: Diagnosis not present

## 2016-11-11 DIAGNOSIS — Z79899 Other long term (current) drug therapy: Secondary | ICD-10-CM | POA: Diagnosis not present

## 2016-11-11 DIAGNOSIS — D509 Iron deficiency anemia, unspecified: Secondary | ICD-10-CM | POA: Diagnosis not present

## 2016-11-11 DIAGNOSIS — G4733 Obstructive sleep apnea (adult) (pediatric): Secondary | ICD-10-CM | POA: Diagnosis not present

## 2016-11-11 DIAGNOSIS — N183 Chronic kidney disease, stage 3 (moderate): Secondary | ICD-10-CM | POA: Diagnosis not present

## 2016-11-11 DIAGNOSIS — E559 Vitamin D deficiency, unspecified: Secondary | ICD-10-CM | POA: Diagnosis not present

## 2016-11-11 DIAGNOSIS — R809 Proteinuria, unspecified: Secondary | ICD-10-CM | POA: Diagnosis not present

## 2016-11-13 DIAGNOSIS — R809 Proteinuria, unspecified: Secondary | ICD-10-CM | POA: Diagnosis not present

## 2016-11-13 DIAGNOSIS — N183 Chronic kidney disease, stage 3 (moderate): Secondary | ICD-10-CM | POA: Diagnosis not present

## 2016-11-13 DIAGNOSIS — D638 Anemia in other chronic diseases classified elsewhere: Secondary | ICD-10-CM | POA: Diagnosis not present

## 2016-11-24 DIAGNOSIS — R0902 Hypoxemia: Secondary | ICD-10-CM | POA: Diagnosis not present

## 2016-11-24 DIAGNOSIS — G4733 Obstructive sleep apnea (adult) (pediatric): Secondary | ICD-10-CM | POA: Diagnosis not present

## 2016-12-11 DIAGNOSIS — G4733 Obstructive sleep apnea (adult) (pediatric): Secondary | ICD-10-CM | POA: Diagnosis not present

## 2016-12-11 DIAGNOSIS — I10 Essential (primary) hypertension: Secondary | ICD-10-CM | POA: Diagnosis not present

## 2016-12-11 DIAGNOSIS — E1121 Type 2 diabetes mellitus with diabetic nephropathy: Secondary | ICD-10-CM | POA: Diagnosis not present

## 2016-12-11 DIAGNOSIS — N184 Chronic kidney disease, stage 4 (severe): Secondary | ICD-10-CM | POA: Diagnosis not present

## 2016-12-11 DIAGNOSIS — K210 Gastro-esophageal reflux disease with esophagitis: Secondary | ICD-10-CM | POA: Diagnosis not present

## 2016-12-22 IMAGING — CR DG ABD PORTABLE 1V
2 series · 2 of 2 positions shown · non-contrast
Comparison: 08/16/2015

CLINICAL DATA: Abdominal pain and distension

EXAM:
PORTABLE ABDOMEN - 1 VIEW

[AP (1 of 2)]
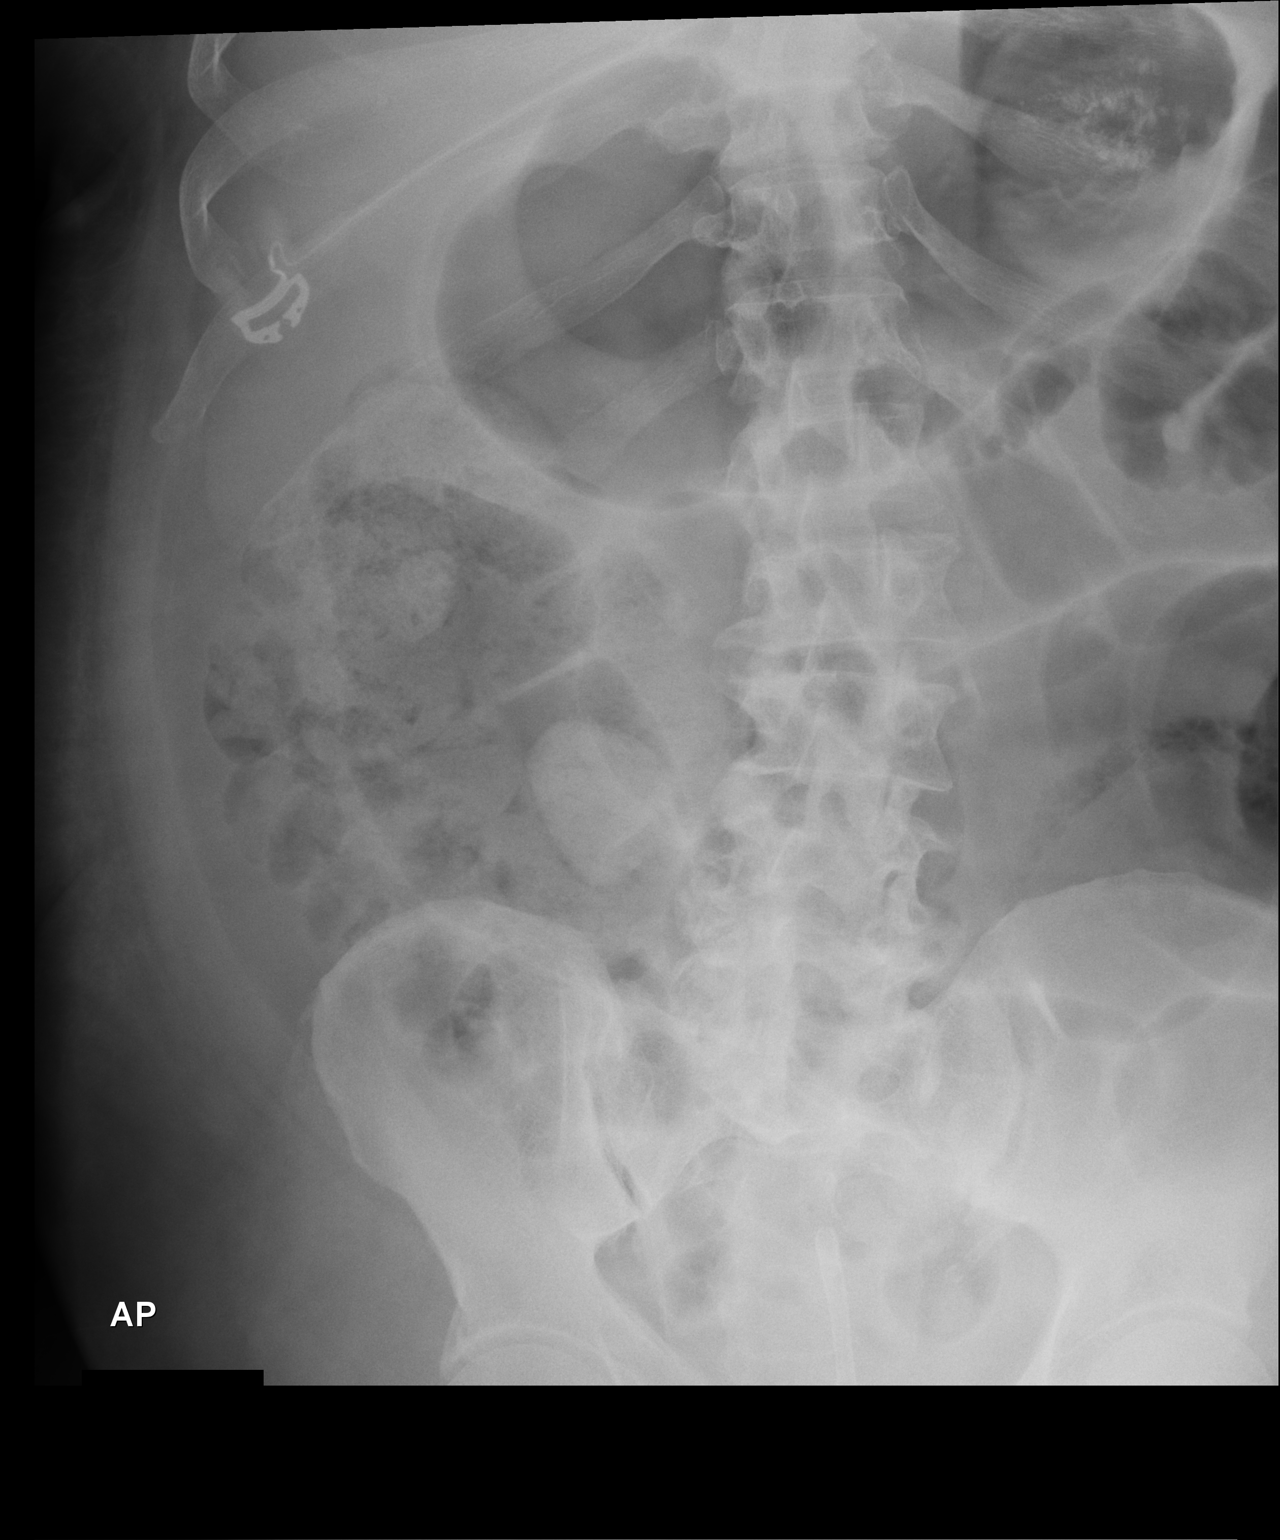

[AP (2 of 2)]
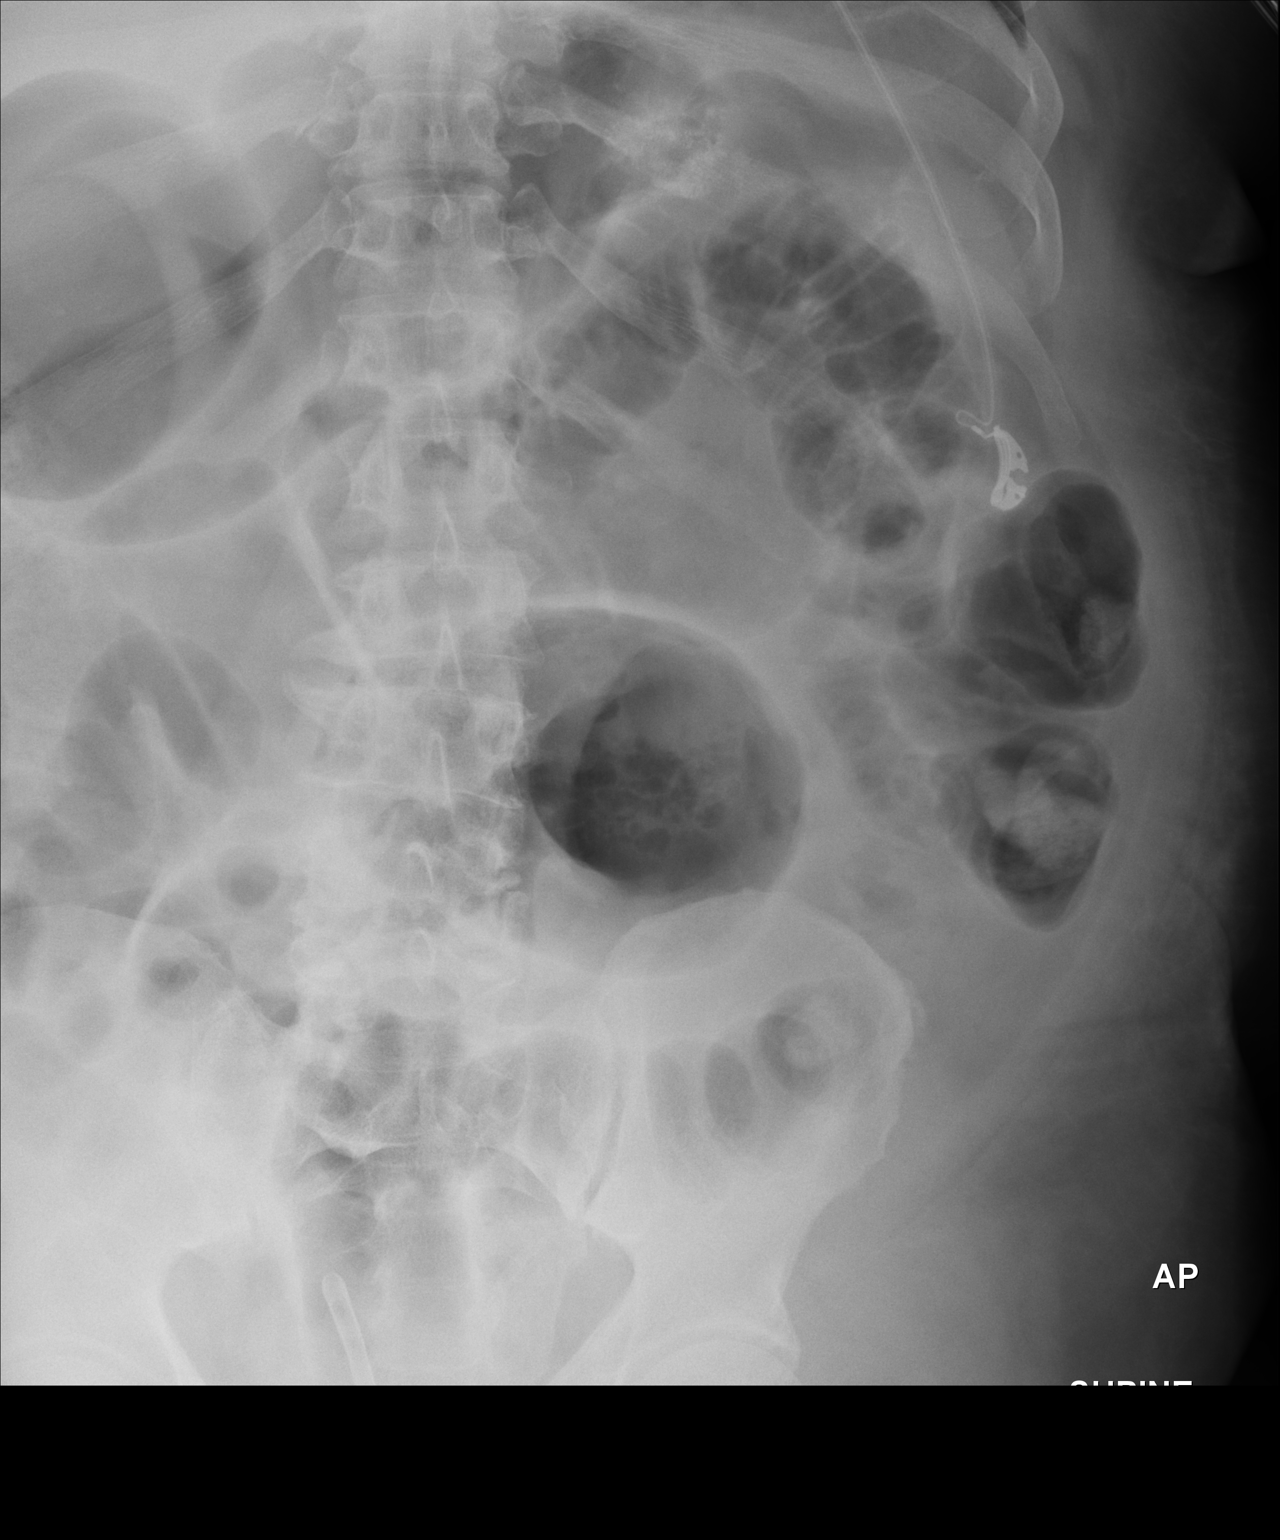

[2 of 2 positions shown; findings below may reference images not displayed]

FINDINGS: Mildly distended large and small bowel with improvement from the
prior study. Stool in the right colon. Foley catheter in the
bladder.

Lumbar levoscoliosis.  No acute skeletal abnormality.
IMPRESSION: Mild dilatation of large and small bowel with improvement from
08/16/2015. Probable ileus.

## 2016-12-24 DIAGNOSIS — G4733 Obstructive sleep apnea (adult) (pediatric): Secondary | ICD-10-CM | POA: Diagnosis not present

## 2016-12-24 DIAGNOSIS — R0902 Hypoxemia: Secondary | ICD-10-CM | POA: Diagnosis not present

## 2016-12-26 DIAGNOSIS — I509 Heart failure, unspecified: Secondary | ICD-10-CM | POA: Diagnosis not present

## 2016-12-26 DIAGNOSIS — G4733 Obstructive sleep apnea (adult) (pediatric): Secondary | ICD-10-CM | POA: Diagnosis not present

## 2017-01-02 ENCOUNTER — Other Ambulatory Visit: Payer: Self-pay | Admitting: Gastroenterology

## 2017-01-02 ENCOUNTER — Other Ambulatory Visit: Payer: Self-pay | Admitting: Physician Assistant

## 2017-01-02 DIAGNOSIS — I509 Heart failure, unspecified: Secondary | ICD-10-CM | POA: Diagnosis not present

## 2017-01-02 DIAGNOSIS — G4733 Obstructive sleep apnea (adult) (pediatric): Secondary | ICD-10-CM | POA: Diagnosis not present

## 2017-01-03 ENCOUNTER — Telehealth: Payer: Self-pay

## 2017-01-03 DIAGNOSIS — Z79899 Other long term (current) drug therapy: Secondary | ICD-10-CM

## 2017-01-03 NOTE — Telephone Encounter (Signed)
Pt's home number is [employer], no one by his name there.LM on cell which was a Biomedical engineer.Mailed lab slip with instructions to get potassium checked in 2 weeks after starting K+

## 2017-01-11 DIAGNOSIS — G4733 Obstructive sleep apnea (adult) (pediatric): Secondary | ICD-10-CM | POA: Diagnosis not present

## 2017-01-21 ENCOUNTER — Other Ambulatory Visit (HOSPITAL_COMMUNITY)
Admission: RE | Admit: 2017-01-21 | Discharge: 2017-01-21 | Disposition: A | Discharge status: Home / Self Care | Payer: Medicare Other | Source: Ambulatory Visit | Attending: Adult Health | Admitting: Adult Health

## 2017-01-21 DIAGNOSIS — Z79899 Other long term (current) drug therapy: Secondary | ICD-10-CM | POA: Diagnosis not present

## 2017-01-21 DIAGNOSIS — N183 Chronic kidney disease, stage 3 (moderate): Secondary | ICD-10-CM | POA: Diagnosis not present

## 2017-01-21 DIAGNOSIS — E559 Vitamin D deficiency, unspecified: Secondary | ICD-10-CM | POA: Diagnosis not present

## 2017-01-21 DIAGNOSIS — E1140 Type 2 diabetes mellitus with diabetic neuropathy, unspecified: Secondary | ICD-10-CM | POA: Diagnosis not present

## 2017-01-21 DIAGNOSIS — E1151 Type 2 diabetes mellitus with diabetic peripheral angiopathy without gangrene: Secondary | ICD-10-CM | POA: Diagnosis not present

## 2017-01-21 DIAGNOSIS — R69 Illness, unspecified: Secondary | ICD-10-CM | POA: Diagnosis not present

## 2017-01-21 DIAGNOSIS — I10 Essential (primary) hypertension: Secondary | ICD-10-CM | POA: Diagnosis not present

## 2017-01-21 DIAGNOSIS — E1121 Type 2 diabetes mellitus with diabetic nephropathy: Secondary | ICD-10-CM | POA: Diagnosis not present

## 2017-01-21 DIAGNOSIS — D509 Iron deficiency anemia, unspecified: Secondary | ICD-10-CM | POA: Diagnosis not present

## 2017-01-21 DIAGNOSIS — R809 Proteinuria, unspecified: Secondary | ICD-10-CM | POA: Diagnosis not present

## 2017-01-21 LAB — BASIC METABOLIC PANEL
Anion gap: 10 (ref 5–15)
BUN: 37 mg/dL — ABNORMAL HIGH (ref 6–20)
CO2: 28 mmol/L (ref 22–32)
Calcium: 10.3 mg/dL (ref 8.9–10.3)
Chloride: 99 mmol/L — ABNORMAL LOW (ref 101–111)
Creatinine, Ser: 2.93 mg/dL — ABNORMAL HIGH (ref 0.61–1.24)
GFR calc Af Amer: 25 mL/min — ABNORMAL LOW (ref >60)
GFR calc non Af Amer: 21 mL/min — ABNORMAL LOW (ref >60)
Glucose, Bld: 55 mg/dL — ABNORMAL LOW (ref 65–99)
Potassium: 3.3 mmol/L — ABNORMAL LOW (ref 3.5–5.1)
Sodium: 137 mmol/L (ref 135–145)

## 2017-01-22 ENCOUNTER — Telehealth: Payer: Self-pay | Admitting: *Deleted

## 2017-01-22 NOTE — Telephone Encounter (Signed)
Called patient with test results. No answer. Left message to call back.  

## 2017-01-22 NOTE — Telephone Encounter (Signed)
-----   Message from Lendon Colonel, NP sent at 01/22/2017  9:23 AM EST ----- Please increase potassium to 20 mEq daily. Repeat BMET in one week. He has chronic kidney disease will need to follow up with this by PCP.

## 2017-01-24 DIAGNOSIS — G4733 Obstructive sleep apnea (adult) (pediatric): Secondary | ICD-10-CM | POA: Diagnosis not present

## 2017-01-24 DIAGNOSIS — R0902 Hypoxemia: Secondary | ICD-10-CM | POA: Diagnosis not present

## 2017-01-29 ENCOUNTER — Telehealth: Payer: Self-pay | Admitting: *Deleted

## 2017-01-29 DIAGNOSIS — I5021 Acute systolic (congestive) heart failure: Secondary | ICD-10-CM

## 2017-01-29 MED ORDER — POTASSIUM CHLORIDE CRYS ER 20 MEQ PO TBCR: 20.0000 meq | EXTENDED_RELEASE_TABLET | Freq: Every day | ORAL | 3 refills | Status: DC

## 2017-01-29 NOTE — Telephone Encounter (Signed)
-----   Message from Kathryn M Lawrence, NP sent at 01/22/2017  9:23 AM EST ----- Please increase potassium to 20 mEq daily. Repeat BMET in one week. He has chronic kidney disease will need to follow up with this by PCP. 

## 2017-01-29 NOTE — Telephone Encounter (Signed)
Called pt home number listed, male answering phone states that no one by that name is there.

## 2017-01-29 NOTE — Telephone Encounter (Signed)
Spoke with Ms. Frank Hoover. She states that pt has changed numbers and has updated office with current number. Ms. Frank Hoover will provide transportation for lab work and ensure pt understands the correct dose of medication.

## 2017-01-29 NOTE — Telephone Encounter (Signed)
-----   Message from Lendon Colonel, NP sent at 01/22/2017  9:23 AM EST ----- Please increase potassium to 20 mEq daily. Repeat BMET in one week. He has chronic kidney disease will need to follow up with this by PCP.

## 2017-01-31 DIAGNOSIS — E559 Vitamin D deficiency, unspecified: Secondary | ICD-10-CM | POA: Diagnosis not present

## 2017-01-31 DIAGNOSIS — D638 Anemia in other chronic diseases classified elsewhere: Secondary | ICD-10-CM | POA: Diagnosis not present

## 2017-01-31 DIAGNOSIS — N184 Chronic kidney disease, stage 4 (severe): Secondary | ICD-10-CM | POA: Diagnosis not present

## 2017-02-01 DIAGNOSIS — G4733 Obstructive sleep apnea (adult) (pediatric): Secondary | ICD-10-CM | POA: Diagnosis not present

## 2017-02-01 DIAGNOSIS — I509 Heart failure, unspecified: Secondary | ICD-10-CM | POA: Diagnosis not present

## 2017-02-06 DIAGNOSIS — I5021 Acute systolic (congestive) heart failure: Secondary | ICD-10-CM | POA: Diagnosis not present

## 2017-02-07 ENCOUNTER — Telehealth: Payer: Self-pay

## 2017-02-07 LAB — BASIC METABOLIC PANEL
BUN/Creatinine Ratio: 14 (ref 10–24)
BUN: 35 mg/dL — ABNORMAL HIGH (ref 8–27)
CO2: 28 mmol/L (ref 20–29)
Calcium: 10.5 mg/dL — ABNORMAL HIGH (ref 8.6–10.2)
Chloride: 98 mmol/L (ref 96–106)
Creatinine, Ser: 2.54 mg/dL — ABNORMAL HIGH (ref 0.76–1.27)
GFR calc Af Amer: 29 mL/min/{1.73_m2} — ABNORMAL LOW (ref >59)
GFR calc non Af Amer: 25 mL/min/{1.73_m2} — ABNORMAL LOW (ref >59)
Glucose: 46 mg/dL — ABNORMAL LOW (ref 65–99)
Potassium: 4.3 mmol/L (ref 3.5–5.2)
Sodium: 141 mmol/L (ref 134–144)

## 2017-02-07 NOTE — Telephone Encounter (Signed)
Called mobile phone. Left message for pt to return call.

## 2017-02-07 NOTE — Telephone Encounter (Signed)
-----   Message from Lendon Colonel, NP sent at 02/07/2017  7:37 AM EST ----- Please have him stop Amaryl temporarily and contact his PCP. Blood glucose is very low on blood draw. Not sure if this was fasting or not. May need adjustments. Continue the insulin pen. Otherwise no changes to his cardiac medications.

## 2017-02-10 DIAGNOSIS — G4733 Obstructive sleep apnea (adult) (pediatric): Secondary | ICD-10-CM | POA: Diagnosis not present

## 2017-02-12 ENCOUNTER — Telehealth: Payer: Self-pay | Admitting: *Deleted

## 2017-02-12 NOTE — Telephone Encounter (Signed)
-----   Message from Lendon Colonel, NP sent at 02/07/2017  7:37 AM EST ----- Please have him stop Amaryl temporarily and contact his PCP. Blood glucose is very low on blood draw. Not sure if this was fasting or not. May need adjustments. Continue the insulin pen. Otherwise no changes to his cardiac medications.

## 2017-02-12 NOTE — Telephone Encounter (Signed)
[place] notified to d/c Amaryl

## 2017-02-23 DIAGNOSIS — G4733 Obstructive sleep apnea (adult) (pediatric): Secondary | ICD-10-CM | POA: Diagnosis not present

## 2017-02-23 DIAGNOSIS — R0902 Hypoxemia: Secondary | ICD-10-CM | POA: Diagnosis not present

## 2017-03-04 DIAGNOSIS — I509 Heart failure, unspecified: Secondary | ICD-10-CM | POA: Diagnosis not present

## 2017-03-04 DIAGNOSIS — G4733 Obstructive sleep apnea (adult) (pediatric): Secondary | ICD-10-CM | POA: Diagnosis not present

## 2017-03-06 ENCOUNTER — Other Ambulatory Visit: Payer: Self-pay

## 2017-03-06 ENCOUNTER — Other Ambulatory Visit: Payer: Self-pay | Admitting: Adult Health

## 2017-03-06 ENCOUNTER — Telehealth: Payer: Self-pay

## 2017-03-06 MED ORDER — POTASSIUM CHLORIDE CRYS ER 20 MEQ PO TBCR: 20.0000 meq | EXTENDED_RELEASE_TABLET | Freq: Every day | ORAL | 3 refills | Status: DC

## 2017-03-06 NOTE — Telephone Encounter (Signed)
Per phone note of 01/29/18 potassium increased to 20 meq daily

## 2017-03-06 NOTE — Telephone Encounter (Signed)
escribed potassium to pharmacy

## 2017-03-13 DIAGNOSIS — G4733 Obstructive sleep apnea (adult) (pediatric): Secondary | ICD-10-CM | POA: Diagnosis not present

## 2017-03-17 DIAGNOSIS — I110 Hypertensive heart disease with heart failure: Secondary | ICD-10-CM | POA: Diagnosis not present

## 2017-03-17 DIAGNOSIS — I129 Hypertensive chronic kidney disease with stage 1 through stage 4 chronic kidney disease, or unspecified chronic kidney disease: Secondary | ICD-10-CM | POA: Diagnosis not present

## 2017-03-17 DIAGNOSIS — E1121 Type 2 diabetes mellitus with diabetic nephropathy: Secondary | ICD-10-CM | POA: Diagnosis not present

## 2017-03-17 DIAGNOSIS — N184 Chronic kidney disease, stage 4 (severe): Secondary | ICD-10-CM | POA: Diagnosis not present

## 2017-03-26 DIAGNOSIS — G4733 Obstructive sleep apnea (adult) (pediatric): Secondary | ICD-10-CM | POA: Diagnosis not present

## 2017-03-26 DIAGNOSIS — R0902 Hypoxemia: Secondary | ICD-10-CM | POA: Diagnosis not present

## 2017-04-04 DIAGNOSIS — G4733 Obstructive sleep apnea (adult) (pediatric): Secondary | ICD-10-CM | POA: Diagnosis not present

## 2017-04-04 DIAGNOSIS — I509 Heart failure, unspecified: Secondary | ICD-10-CM | POA: Diagnosis not present

## 2017-04-11 DIAGNOSIS — E559 Vitamin D deficiency, unspecified: Secondary | ICD-10-CM | POA: Diagnosis not present

## 2017-04-11 DIAGNOSIS — N183 Chronic kidney disease, stage 3 (moderate): Secondary | ICD-10-CM | POA: Diagnosis not present

## 2017-04-11 DIAGNOSIS — I10 Essential (primary) hypertension: Secondary | ICD-10-CM | POA: Diagnosis not present

## 2017-04-11 DIAGNOSIS — Z79899 Other long term (current) drug therapy: Secondary | ICD-10-CM | POA: Diagnosis not present

## 2017-04-11 DIAGNOSIS — R809 Proteinuria, unspecified: Secondary | ICD-10-CM | POA: Diagnosis not present

## 2017-04-11 DIAGNOSIS — D509 Iron deficiency anemia, unspecified: Secondary | ICD-10-CM | POA: Diagnosis not present

## 2017-04-13 DIAGNOSIS — G4733 Obstructive sleep apnea (adult) (pediatric): Secondary | ICD-10-CM | POA: Diagnosis not present

## 2017-04-16 DIAGNOSIS — E1129 Type 2 diabetes mellitus with other diabetic kidney complication: Secondary | ICD-10-CM | POA: Diagnosis not present

## 2017-04-16 DIAGNOSIS — I509 Heart failure, unspecified: Secondary | ICD-10-CM | POA: Diagnosis not present

## 2017-04-16 DIAGNOSIS — N184 Chronic kidney disease, stage 4 (severe): Secondary | ICD-10-CM | POA: Diagnosis not present

## 2017-04-16 DIAGNOSIS — I10 Essential (primary) hypertension: Secondary | ICD-10-CM | POA: Diagnosis not present

## 2017-04-16 DIAGNOSIS — M9089 Osteopathy in diseases classified elsewhere, multiple sites: Secondary | ICD-10-CM | POA: Diagnosis not present

## 2017-04-26 DIAGNOSIS — G4733 Obstructive sleep apnea (adult) (pediatric): Secondary | ICD-10-CM | POA: Diagnosis not present

## 2017-04-26 DIAGNOSIS — R0902 Hypoxemia: Secondary | ICD-10-CM | POA: Diagnosis not present

## 2017-05-02 DIAGNOSIS — G4733 Obstructive sleep apnea (adult) (pediatric): Secondary | ICD-10-CM | POA: Diagnosis not present

## 2017-05-02 DIAGNOSIS — I509 Heart failure, unspecified: Secondary | ICD-10-CM | POA: Diagnosis not present

## 2017-05-07 IMAGING — DX DG FOOT COMPLETE 3+V*L*
3 series · 3 of 3 positions shown · non-contrast
Comparison: None.

CLINICAL DATA: Left foot pain and swelling.  Unknown injury.

EXAM:
LEFT FOOT - COMPLETE 3+ VIEW

[foot ap]
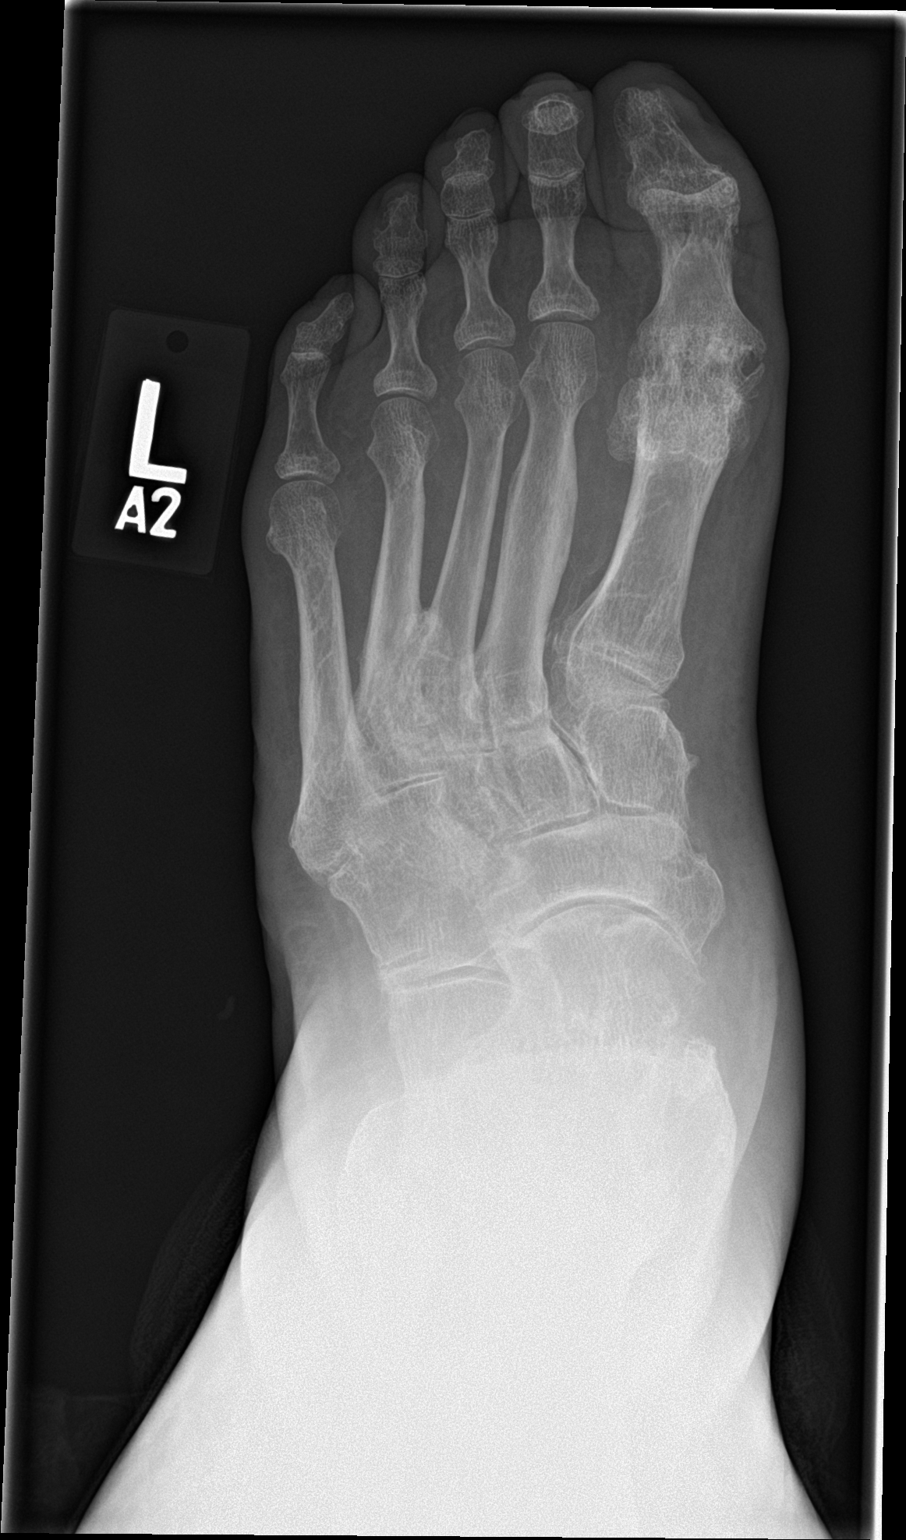

[foot obl]
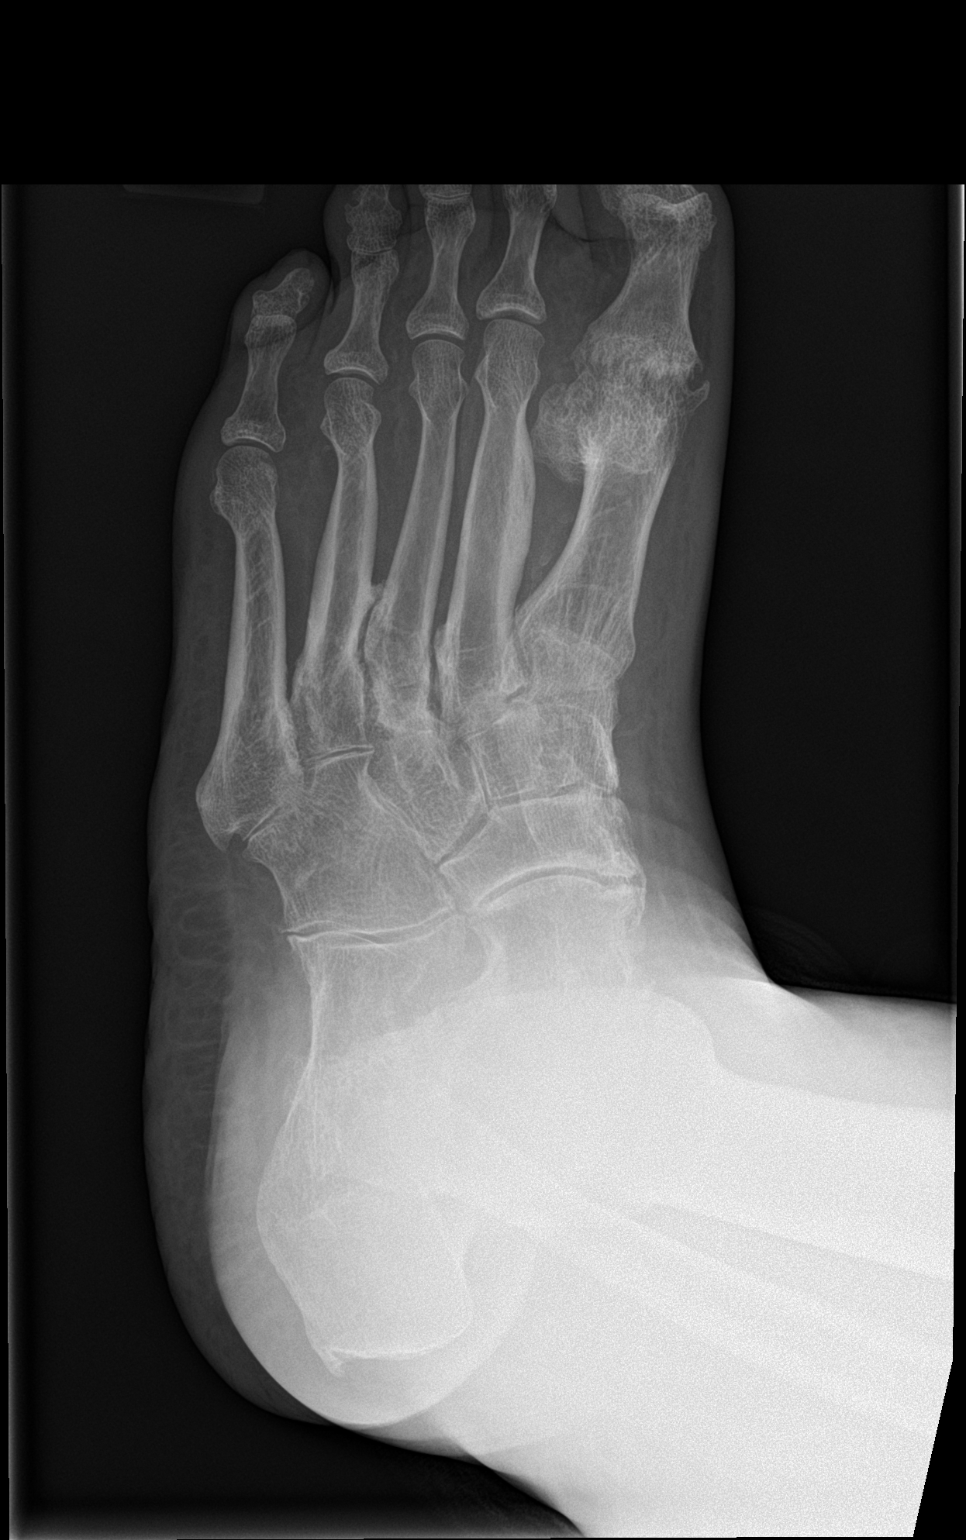

[foot lat]
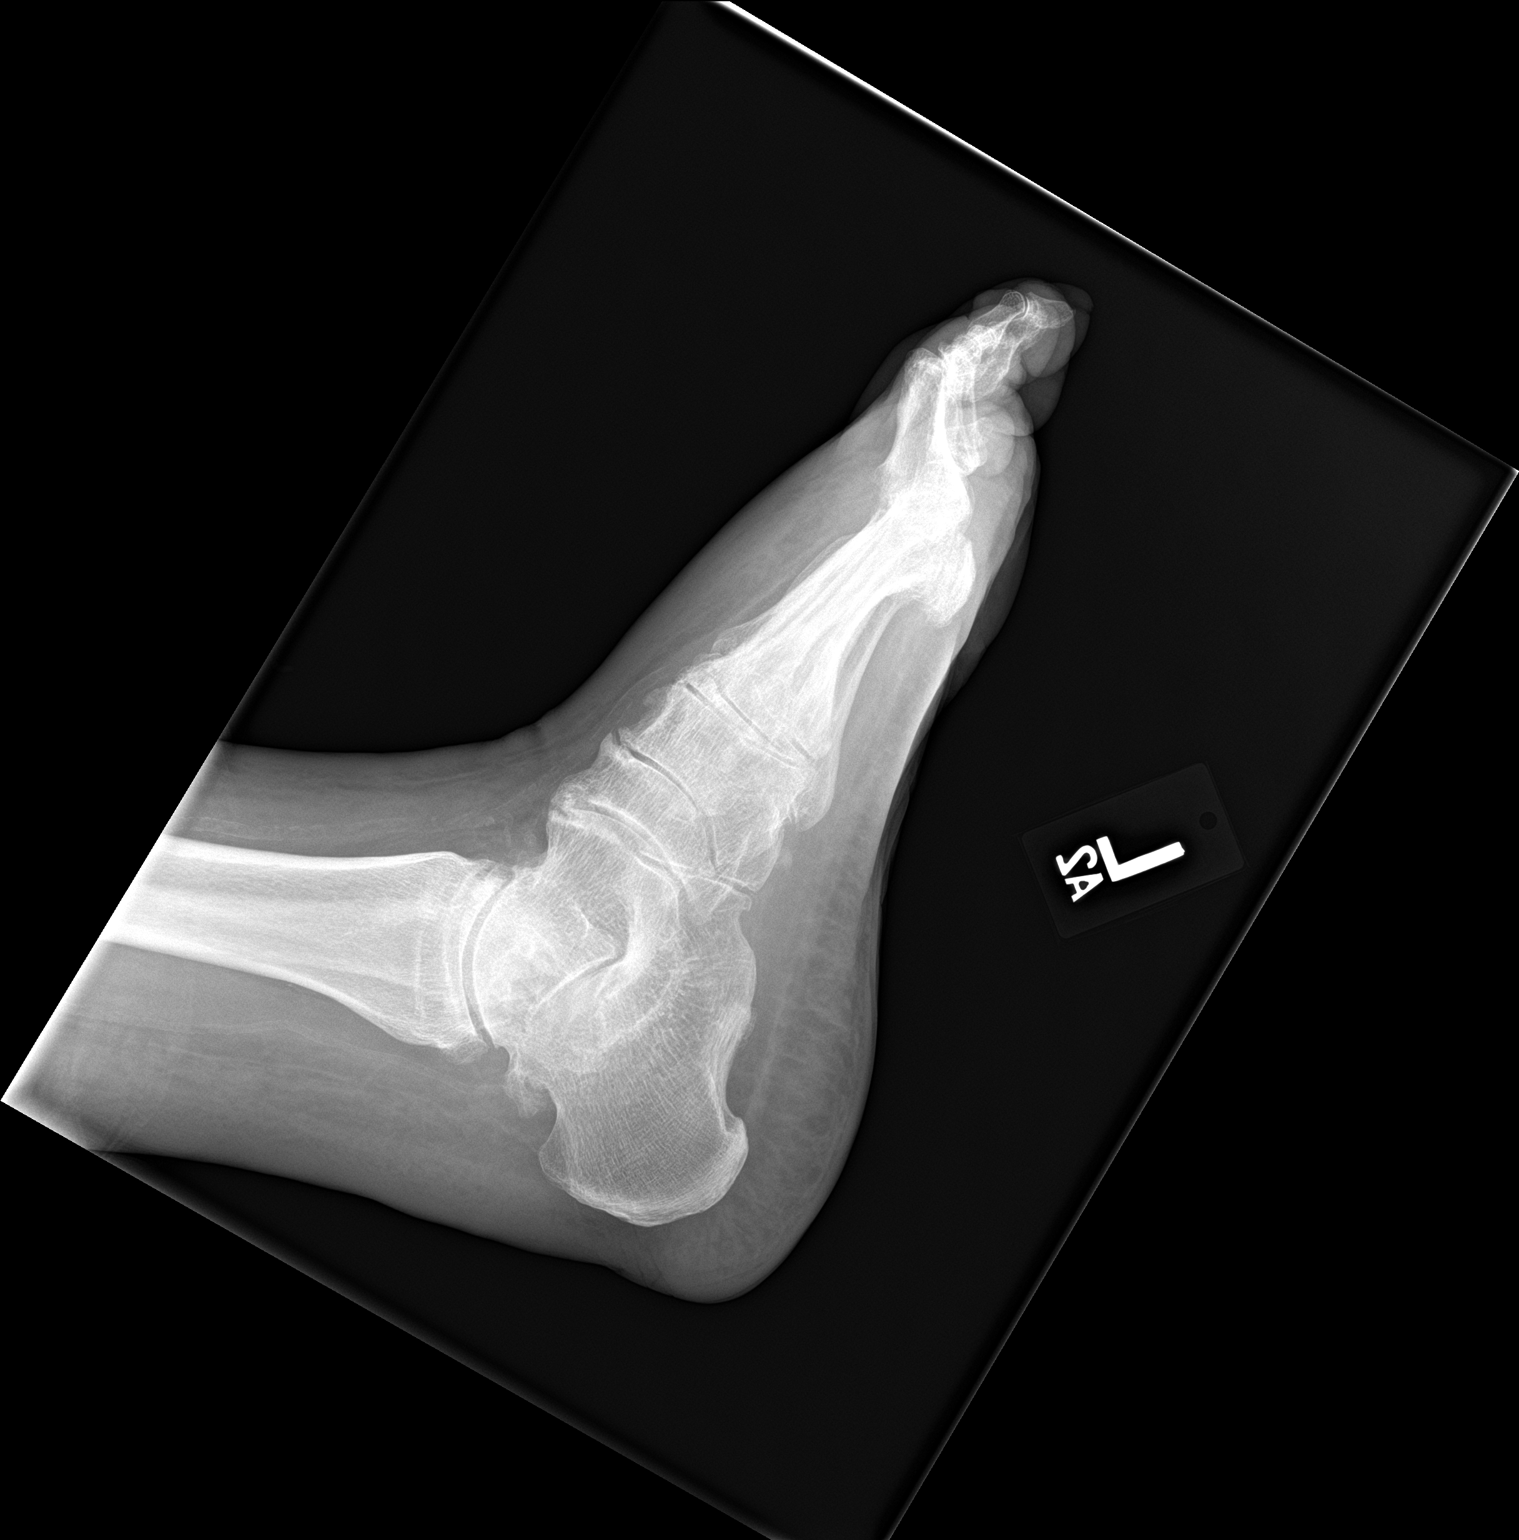

[3 of 3 positions shown; findings below may reference images not displayed]

FINDINGS: Abnormal appearance of the first metatarsal phalangeal joint with
ill-defined joint space and possible partial bony ankylosis.
Probable periarticular erosion noted medially with overhanging
edges. Mild hammertoe deformity of the digits. Osteoarthritis
throughout the midfoot and tarsal metatarsal articulations.
Osteoarthritis noted about the ankle joint to and posterior subtalar
joint. Probable remote prior injury at the proximal third-fourth
metatarsals with partial osseous bridging, and smooth cortical
thickening of the second metatarsal. No acute fracture. Vascular
calcifications are seen. There is dorsal soft tissue edema.
IMPRESSION: 1. Findings suspicious for gout about the first metatarsal
phalangeal joint.
2. Multifocal osteoarthritis. Probable remote prior injuries to the
9nd-5th metatarsals.

## 2017-05-11 DIAGNOSIS — G4733 Obstructive sleep apnea (adult) (pediatric): Secondary | ICD-10-CM | POA: Diagnosis not present

## 2017-05-24 DIAGNOSIS — R0902 Hypoxemia: Secondary | ICD-10-CM | POA: Diagnosis not present

## 2017-05-24 DIAGNOSIS — G4733 Obstructive sleep apnea (adult) (pediatric): Secondary | ICD-10-CM | POA: Diagnosis not present

## 2017-06-02 DIAGNOSIS — G4733 Obstructive sleep apnea (adult) (pediatric): Secondary | ICD-10-CM | POA: Diagnosis not present

## 2017-06-02 DIAGNOSIS — I509 Heart failure, unspecified: Secondary | ICD-10-CM | POA: Diagnosis not present

## 2017-06-11 DIAGNOSIS — G4733 Obstructive sleep apnea (adult) (pediatric): Secondary | ICD-10-CM | POA: Diagnosis not present

## 2017-06-12 DIAGNOSIS — M79676 Pain in unspecified toe(s): Secondary | ICD-10-CM | POA: Diagnosis not present

## 2017-06-12 DIAGNOSIS — E1142 Type 2 diabetes mellitus with diabetic polyneuropathy: Secondary | ICD-10-CM | POA: Diagnosis not present

## 2017-06-12 DIAGNOSIS — B351 Tinea unguium: Secondary | ICD-10-CM | POA: Diagnosis not present

## 2017-06-12 DIAGNOSIS — L84 Corns and callosities: Secondary | ICD-10-CM | POA: Diagnosis not present

## 2017-06-24 DIAGNOSIS — R0902 Hypoxemia: Secondary | ICD-10-CM | POA: Diagnosis not present

## 2017-06-24 DIAGNOSIS — G4733 Obstructive sleep apnea (adult) (pediatric): Secondary | ICD-10-CM | POA: Diagnosis not present

## 2017-06-27 DIAGNOSIS — I10 Essential (primary) hypertension: Secondary | ICD-10-CM | POA: Diagnosis not present

## 2017-06-27 DIAGNOSIS — Z79899 Other long term (current) drug therapy: Secondary | ICD-10-CM | POA: Diagnosis not present

## 2017-06-27 DIAGNOSIS — D509 Iron deficiency anemia, unspecified: Secondary | ICD-10-CM | POA: Diagnosis not present

## 2017-06-27 DIAGNOSIS — N183 Chronic kidney disease, stage 3 (moderate): Secondary | ICD-10-CM | POA: Diagnosis not present

## 2017-06-27 DIAGNOSIS — R809 Proteinuria, unspecified: Secondary | ICD-10-CM | POA: Diagnosis not present

## 2017-07-02 DIAGNOSIS — I10 Essential (primary) hypertension: Secondary | ICD-10-CM | POA: Diagnosis not present

## 2017-07-02 DIAGNOSIS — G4733 Obstructive sleep apnea (adult) (pediatric): Secondary | ICD-10-CM | POA: Diagnosis not present

## 2017-07-02 DIAGNOSIS — I509 Heart failure, unspecified: Secondary | ICD-10-CM | POA: Diagnosis not present

## 2017-07-02 DIAGNOSIS — N183 Chronic kidney disease, stage 3 (moderate): Secondary | ICD-10-CM | POA: Diagnosis not present

## 2017-07-02 DIAGNOSIS — R809 Proteinuria, unspecified: Secondary | ICD-10-CM | POA: Diagnosis not present

## 2017-07-02 DIAGNOSIS — E1129 Type 2 diabetes mellitus with other diabetic kidney complication: Secondary | ICD-10-CM | POA: Diagnosis not present

## 2017-07-09 ENCOUNTER — Other Ambulatory Visit: Payer: Self-pay | Admitting: Cardiology

## 2017-07-11 DIAGNOSIS — G4733 Obstructive sleep apnea (adult) (pediatric): Secondary | ICD-10-CM | POA: Diagnosis not present

## 2017-07-24 DIAGNOSIS — R0902 Hypoxemia: Secondary | ICD-10-CM | POA: Diagnosis not present

## 2017-07-24 DIAGNOSIS — G4733 Obstructive sleep apnea (adult) (pediatric): Secondary | ICD-10-CM | POA: Diagnosis not present

## 2017-08-02 DIAGNOSIS — G4733 Obstructive sleep apnea (adult) (pediatric): Secondary | ICD-10-CM | POA: Diagnosis not present

## 2017-08-02 DIAGNOSIS — I509 Heart failure, unspecified: Secondary | ICD-10-CM | POA: Diagnosis not present

## 2017-08-11 DIAGNOSIS — G4733 Obstructive sleep apnea (adult) (pediatric): Secondary | ICD-10-CM | POA: Diagnosis not present

## 2017-08-24 DIAGNOSIS — R0902 Hypoxemia: Secondary | ICD-10-CM | POA: Diagnosis not present

## 2017-08-24 DIAGNOSIS — G4733 Obstructive sleep apnea (adult) (pediatric): Secondary | ICD-10-CM | POA: Diagnosis not present

## 2017-09-01 DIAGNOSIS — I509 Heart failure, unspecified: Secondary | ICD-10-CM | POA: Diagnosis not present

## 2017-09-01 DIAGNOSIS — G4733 Obstructive sleep apnea (adult) (pediatric): Secondary | ICD-10-CM | POA: Diagnosis not present

## 2017-09-19 DIAGNOSIS — N184 Chronic kidney disease, stage 4 (severe): Secondary | ICD-10-CM | POA: Diagnosis not present

## 2017-09-19 DIAGNOSIS — J449 Chronic obstructive pulmonary disease, unspecified: Secondary | ICD-10-CM | POA: Diagnosis not present

## 2017-09-19 DIAGNOSIS — E1121 Type 2 diabetes mellitus with diabetic nephropathy: Secondary | ICD-10-CM | POA: Diagnosis not present

## 2017-09-19 DIAGNOSIS — G4733 Obstructive sleep apnea (adult) (pediatric): Secondary | ICD-10-CM | POA: Diagnosis not present

## 2017-09-23 DIAGNOSIS — R0902 Hypoxemia: Secondary | ICD-10-CM | POA: Diagnosis not present

## 2017-09-23 DIAGNOSIS — G4733 Obstructive sleep apnea (adult) (pediatric): Secondary | ICD-10-CM | POA: Diagnosis not present

## 2017-09-24 ENCOUNTER — Other Ambulatory Visit: Payer: Self-pay | Admitting: Internal Medicine

## 2017-10-02 DIAGNOSIS — I509 Heart failure, unspecified: Secondary | ICD-10-CM | POA: Diagnosis not present

## 2017-10-02 DIAGNOSIS — G4733 Obstructive sleep apnea (adult) (pediatric): Secondary | ICD-10-CM | POA: Diagnosis not present

## 2017-10-07 DIAGNOSIS — I10 Essential (primary) hypertension: Secondary | ICD-10-CM | POA: Diagnosis not present

## 2017-10-07 DIAGNOSIS — E559 Vitamin D deficiency, unspecified: Secondary | ICD-10-CM | POA: Diagnosis not present

## 2017-10-07 DIAGNOSIS — Z79899 Other long term (current) drug therapy: Secondary | ICD-10-CM | POA: Diagnosis not present

## 2017-10-07 DIAGNOSIS — R809 Proteinuria, unspecified: Secondary | ICD-10-CM | POA: Diagnosis not present

## 2017-10-07 DIAGNOSIS — N183 Chronic kidney disease, stage 3 (moderate): Secondary | ICD-10-CM | POA: Diagnosis not present

## 2017-10-07 DIAGNOSIS — D509 Iron deficiency anemia, unspecified: Secondary | ICD-10-CM | POA: Diagnosis not present

## 2017-10-23 DIAGNOSIS — D638 Anemia in other chronic diseases classified elsewhere: Secondary | ICD-10-CM | POA: Diagnosis not present

## 2017-10-23 DIAGNOSIS — E559 Vitamin D deficiency, unspecified: Secondary | ICD-10-CM | POA: Diagnosis not present

## 2017-10-23 DIAGNOSIS — R809 Proteinuria, unspecified: Secondary | ICD-10-CM | POA: Diagnosis not present

## 2017-10-23 DIAGNOSIS — N183 Chronic kidney disease, stage 3 (moderate): Secondary | ICD-10-CM | POA: Diagnosis not present

## 2017-10-24 DIAGNOSIS — G4733 Obstructive sleep apnea (adult) (pediatric): Secondary | ICD-10-CM | POA: Diagnosis not present

## 2017-10-24 DIAGNOSIS — R0902 Hypoxemia: Secondary | ICD-10-CM | POA: Diagnosis not present

## 2017-10-30 ENCOUNTER — Other Ambulatory Visit: Payer: Self-pay | Admitting: Cardiology

## 2017-10-30 ENCOUNTER — Ambulatory Visit (INDEPENDENT_AMBULATORY_CARE_PROVIDER_SITE_OTHER): Payer: Medicare Other | Admitting: *Deleted

## 2017-10-30 DIAGNOSIS — I255 Ischemic cardiomyopathy: Secondary | ICD-10-CM | POA: Diagnosis not present

## 2017-10-30 DIAGNOSIS — I5022 Chronic systolic (congestive) heart failure: Secondary | ICD-10-CM

## 2017-10-30 NOTE — Progress Notes (Signed)
Remote ICD transmission.   

## 2017-10-31 ENCOUNTER — Encounter: Payer: Self-pay | Admitting: Cardiology

## 2017-11-02 DIAGNOSIS — G4733 Obstructive sleep apnea (adult) (pediatric): Secondary | ICD-10-CM | POA: Diagnosis not present

## 2017-11-02 DIAGNOSIS — I509 Heart failure, unspecified: Secondary | ICD-10-CM | POA: Diagnosis not present

## 2017-11-06 ENCOUNTER — Encounter: Payer: Self-pay | Admitting: *Deleted

## 2017-11-06 ENCOUNTER — Ambulatory Visit (INDEPENDENT_AMBULATORY_CARE_PROVIDER_SITE_OTHER): Payer: Medicare Other | Admitting: Cardiology

## 2017-11-06 ENCOUNTER — Encounter: Payer: Self-pay | Admitting: Cardiology

## 2017-11-06 VITALS — BP 117/74 | HR 60 | Ht 66.0 in | Wt 246.8 lb

## 2017-11-06 DIAGNOSIS — I4892 Unspecified atrial flutter: Secondary | ICD-10-CM

## 2017-11-06 DIAGNOSIS — I5022 Chronic systolic (congestive) heart failure: Secondary | ICD-10-CM | POA: Diagnosis not present

## 2017-11-06 DIAGNOSIS — N184 Chronic kidney disease, stage 4 (severe): Secondary | ICD-10-CM

## 2017-11-06 NOTE — Progress Notes (Signed)
Clinical Summary Mr. Latour is a 67 y.o.male seen today for follow up of the following medical problems.   1. 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    -no recent palpitatins     2. Chronic systolic HF - echo 04/9474 LVEF 25%, diffuse hypokinesis. Mod RV dysfunction.  - new diagnosis 08/2015 during admission with sepsis - thought possible tachcyardia mediated, however he has not had an ischemic evaluation - echo Jan 2018 LVEF 30-35%.   - ICD followed by EP .medical therapy limited due to CKD and previous low bp's - No SOB/DOE. Nor recent edema - compliant with meds. Off hydralazine due to prior low bp;s  3, HTN - compliant with meds  4. CKD IV - followed by Dr Hinda Lenis, with recent visit and labs     Past Medical History:  Diagnosis Date  . Adenomatous colon polyp 10/30/09   Serrated adenoma removed during Colonoscopy   . AICD (automatic cardioverter/defibrillator) present 06/11/2016  . Arthritis    "legs" (09/27/2015)  . Atrial flutter with rapid ventricular response ([place]) 09/27/2015  . CHF (congestive heart failure) ([place])   . CKD (chronic kidney disease) stage 3, GFR 30-59 ml/min   . COPD (chronic obstructive pulmonary disease) ([place])   . Diverticulosis of colon   . Dysrhythmia   . Essential hypertension   . Gastroparesis   . GERD (gastroesophageal reflux disease)   . Gout   . Hyperlipemia   . Hypertension   . OA (osteoarthritis)   . Type 2 diabetes mellitus (HCC)      No Known Allergies   Current Outpatient Medications  Medication Sig Dispense Refill  . acetaminophen (TYLENOL) 500 MG tablet Take 500-1,000 mg by mouth 3 (three) times daily as needed for moderate pain or headache.    . albuterol (PROAIR HFA) 108 (90 BASE) MCG/ACT inhaler Inhale 2 puffs into the lungs every 6 (six)  hours as needed for wheezing or shortness of breath.     . allopurinol (ZYLOPRIM) 300 MG tablet Take 300 mg by mouth daily.      Marland Kitchen alum & mag hydroxide-simeth (MAALOX/MYLANTA) 200-200-20 MG/5ML suspension Take 15 mLs by mouth every 6 (six) hours as needed for indigestion or heartburn.    Marland Kitchen atorvastatin (LIPITOR) 20 MG tablet Take 20 mg by mouth at bedtime.    . cholecalciferol (VITAMIN D) 1000 units tablet Take 1,000 Units by mouth daily.    . furosemide (LASIX) 40 MG tablet Take 1 tablet (40 mg total) by mouth 2 (two) times daily. 180 tablet 3  . gabapentin (NEURONTIN) 400 MG capsule Take 400 mg by mouth 3 (three) times daily. Takes at noon & bedtime    . HYDROcodone-acetaminophen (NORCO/VICODIN) 5-325 MG tablet Take 1 tablet by mouth every 6 (six) hours as needed for severe pain. 10 tablet 0  . insulin lispro (HUMALOG KWIKPEN) 100 UNIT/ML KiwkPen Inject 2-18 Units into the skin as directed. Inject 2 to 18 units based on sliding scale as directed as needed for blood glucose over 200    . isosorbide mononitrate (IMDUR) 30 MG 24 hr tablet TAKE 1 TABLET DAILY 15 tablet 0  . LINZESS 290 MCG CAPS capsule TAKE 1 CAPSULE ONCE DAILY 30 MINUTES BEFORE BREAKFAST 90 capsule 3  . loperamide (IMODIUM) 2 MG capsule Take 2 mg by mouth daily as needed for constipation.    Marland Kitchen  loratadine (CLARITIN) 10 MG tablet Take 10 mg by mouth daily.    . metoprolol succinate (TOPROL-XL) 100 MG 24 hr tablet Take 1 tablet (100 mg total) by mouth 2 (two) times daily. Take with or immediately following a meal. 60 tablet 3  . Multiple Vitamin (MULTIVITAMIN WITH MINERALS) TABS tablet Take 1 tablet by mouth daily.    . ondansetron (ZOFRAN) 4 MG tablet Take 1 tablet (4 mg total) by mouth every 8 (eight) hours as needed for nausea or vomiting. 30 tablet 1  . pantoprazole (PROTONIX) 40 MG tablet Take 1 tablet (40 mg total) by mouth daily.    . potassium chloride SA (KLOR-CON M20) 20 MEQ tablet Take 1 tablet (20 mEq total) by mouth daily.  90 tablet 3  . vitamin C (ASCORBIC ACID) 500 MG tablet Take 500 mg by mouth 2 (two) times daily.     No current facility-administered medications for this visit.      Past Surgical History:  Procedure Laterality Date  . CARDIOVERSION N/A 08/22/2015   Procedure: CARDIOVERSION;  Surgeon: Larey Dresser, MD;  Location: [place];  Service: Cardiovascular;  Laterality: N/A;  . CARDIOVERSION N/A 09/01/2015   Procedure: CARDIOVERSION;  Surgeon: Lelon Perla, MD;  Location: [place];  Service: Cardiovascular;  Laterality: N/A;  . CATARACT EXTRACTION W/ INTRAOCULAR LENS  IMPLANT, BILATERAL Bilateral   . COLONOSCOPY  10/30/09   KKX:[id number] diverticulum/serrated adenoma from ICV, next colonoscopy due 10/2012  . COLONOSCOPY N/A 09/18/2012   JIR:CVELFYB mucosa that was seen appeared normal, however most of it was not seen due to be very poor prep  . COLONOSCOPY N/A 04/08/2013   OFB:PZWCHEN polyp removed/inadequate preparation  . COLONOSCOPY N/A 06/22/2014   Procedure: COLONOSCOPY;  Surgeon: Daneil Dolin, MD;  [facility];  Service: Endoscopy;  Laterality: N/A;  115   . ELECTROPHYSIOLOGIC STUDY N/A 09/27/2015   Procedure: SVT Ablation;  Surgeon: Evans Lance, MD;  Location: [place] [facility];  Service: Cardiovascular;  Laterality: N/A;  . EP IMPLANTABLE DEVICE  06/11/2016  . ESOPHAGOGASTRODUODENOSCOPY  10/30/09   IDP:[id number]   . FLEXIBLE SIGMOIDOSCOPY N/A 08/25/2015   Procedure: FLEXIBLE SIGMOIDOSCOPY;  Surgeon: Jerene Bears, MD;  [facility];  Service: Endoscopy;  Laterality: N/A;  . ICD IMPLANT N/A 06/11/2016   Procedure: ICD Implant;  Surgeon: Evans Lance, MD;  Location: [place] [facility];  Service: Cardiovascular;  Laterality: N/A;  . TEE WITHOUT CARDIOVERSION N/A 08/22/2015   Procedure: TRANSESOPHAGEAL ECHOCARDIOGRAM (TEE);  Surgeon: Larey Dresser, MD;  Location: [place];  Service: Cardiovascular;  Laterality: N/A;  . TEE WITHOUT CARDIOVERSION   09/27/2015   Procedure: Transesophageal Echocardiogram (Tee);  Surgeon: Evans Lance, MD;  Location: [place] [facility];  Service: Cardiovascular;;  . TONSILLECTOMY  1950s/1960s     No Known Allergies    Family History  Problem Relation Age of Onset  . COPD Father   . Diabetes Mother   . Hypertension Mother   . Colon cancer Neg Hx      Social History Mr. Giampietro reports that he quit smoking about 7 years ago. His smoking use included cigarettes. He has a 4.00 pack-year smoking history. He has never used smokeless tobacco. Mr. Palmatier reports that he does not drink alcohol.   Review of Systems CONSTITUTIONAL: No weight loss, fever, chills, weakness or fatigue.  HEENT: Eyes: No visual loss, blurred vision, double vision or yellow sclerae.No hearing loss, sneezing, congestion, runny nose or sore  throat.  SKIN: No rash or itching.  CARDIOVASCULAR: per hpi RESPIRATORY: No shortness of breath, cough or sputum.  GASTROINTESTINAL: No anorexia, nausea, vomiting or diarrhea. No abdominal pain or blood.  GENITOURINARY: No burning on urination, no polyuria NEUROLOGICAL: No headache, dizziness, syncope, paralysis, ataxia, numbness or tingling in the extremities. No change in bowel or bladder control.  MUSCULOSKELETAL: No muscle, back pain, joint pain or stiffness.  LYMPHATICS: No enlarged nodes. No history of splenectomy.  PSYCHIATRIC: No history of depression or anxiety.  ENDOCRINOLOGIC: No reports of sweating, cold or heat intolerance. No polyuria or polydipsia.  Marland Kitchen   Physical Examination Vitals:   11/06/17 1109  BP: 117/74  Pulse: 60  SpO2: 98%   Vitals:   11/06/17 1109  Weight: 246 lb 12.8 oz (111.9 kg)  Height: 5' 6"  (1.676 m)    Gen: resting comfortably, no acute distress HEENT: no scleral icterus, pupils equal round and reactive, no palptable cervical adenopathy,  CV:L RRR, no m/r/g, no jvd Resp: Clear to auscultation bilaterally GI: abdomen is soft, non-tender,  non-distended, normal bowel sounds, no hepatosplenomegaly MSK: extremities are warm, no edema.  Skin: warm, no rash Neuro:  no focal deficits Psych: appropriate affect   Diagnostic Studies Jan 2018 echo Study Conclusions  - Left ventricle: The cavity size was normal. Systolic function was moderately to severely reduced. The estimated ejection fraction was in the range of 30% to 35%. Diffuse hypokinesis. Doppler parameters are consistent with abnormal left ventricular relaxation (grade 1 diastolic dysfunction). Mild to moderate LVH. - Mitral valve: There was mild regurgitation. - Left atrium: The atrium was mildly dilated. - Right ventricle: Systolic function was mildly to moderately reduced. - Atrial septum: No defect or patent foramen ovale was identified.    Assessment and Plan  1. Aflutter  - s/p ablation, followed by EP.  - no symptoms, continue to monitor.  - EKG today shows normal sinus rhythm  2. Chronic systolic HF - medical therapy limited by CKD III and prior low bp's - Renal function continues to prohibit cath.  - continue current meds, no recent symptoms  3. HTN - at goal, continue current meds  4 CKD IV - follow with renal, we will request his most recent labs from Dr Hinda Lenis   f/u 4 months      Arnoldo Lenis, M.D.

## 2017-11-06 NOTE — Patient Instructions (Signed)
Your physician recommends that you schedule a follow-up appointment in: 4 MONTHS WITH DR BRANCH  Your physician recommends that you continue on your current medications as directed. Please refer to the Current Medication list given to you today.  Thank you for choosing [facility]!!    

## 2017-11-24 DIAGNOSIS — R0902 Hypoxemia: Secondary | ICD-10-CM | POA: Diagnosis not present

## 2017-11-24 DIAGNOSIS — G4733 Obstructive sleep apnea (adult) (pediatric): Secondary | ICD-10-CM | POA: Diagnosis not present

## 2017-11-25 ENCOUNTER — Other Ambulatory Visit: Payer: Self-pay | Admitting: Cardiology

## 2017-11-26 LAB — CUP PACEART REMOTE DEVICE CHECK
Date Time Interrogation Session: 20190925151613
Implantable Lead Implant Date: 20180410
Implantable Lead Implant Date: 20180410
Implantable Lead Location: 753859
Implantable Lead Location: 753860
Implantable Lead Model: 292
Implantable Lead Model: 7740
Implantable Lead Serial Number: 420134
Implantable Lead Serial Number: 693412
Implantable Pulse Generator Implant Date: 20180410
Pulse Gen Serial Number: 533916

## 2017-11-27 DIAGNOSIS — E1142 Type 2 diabetes mellitus with diabetic polyneuropathy: Secondary | ICD-10-CM | POA: Diagnosis not present

## 2017-11-27 DIAGNOSIS — L84 Corns and callosities: Secondary | ICD-10-CM | POA: Diagnosis not present

## 2017-11-27 DIAGNOSIS — M79676 Pain in unspecified toe(s): Secondary | ICD-10-CM | POA: Diagnosis not present

## 2017-11-27 DIAGNOSIS — B351 Tinea unguium: Secondary | ICD-10-CM | POA: Diagnosis not present

## 2017-12-02 DIAGNOSIS — G4733 Obstructive sleep apnea (adult) (pediatric): Secondary | ICD-10-CM | POA: Diagnosis not present

## 2017-12-02 DIAGNOSIS — I509 Heart failure, unspecified: Secondary | ICD-10-CM | POA: Diagnosis not present

## 2017-12-16 ENCOUNTER — Other Ambulatory Visit: Payer: Self-pay | Admitting: Gastroenterology

## 2017-12-24 DIAGNOSIS — G4733 Obstructive sleep apnea (adult) (pediatric): Secondary | ICD-10-CM | POA: Diagnosis not present

## 2017-12-24 DIAGNOSIS — Z23 Encounter for immunization: Secondary | ICD-10-CM | POA: Diagnosis not present

## 2017-12-24 DIAGNOSIS — R0902 Hypoxemia: Secondary | ICD-10-CM | POA: Diagnosis not present

## 2017-12-31 ENCOUNTER — Encounter: Payer: Self-pay | Admitting: Internal Medicine

## 2018-01-02 DIAGNOSIS — I509 Heart failure, unspecified: Secondary | ICD-10-CM | POA: Diagnosis not present

## 2018-01-02 DIAGNOSIS — G4733 Obstructive sleep apnea (adult) (pediatric): Secondary | ICD-10-CM | POA: Diagnosis not present

## 2018-01-23 ENCOUNTER — Ambulatory Visit (INDEPENDENT_AMBULATORY_CARE_PROVIDER_SITE_OTHER): Payer: Medicare Other | Admitting: Internal Medicine

## 2018-01-23 ENCOUNTER — Encounter: Payer: Self-pay | Admitting: Internal Medicine

## 2018-01-23 VITALS — BP 122/76 | HR 77 | Ht 66.0 in | Wt 246.0 lb

## 2018-01-23 DIAGNOSIS — I255 Ischemic cardiomyopathy: Secondary | ICD-10-CM | POA: Diagnosis not present

## 2018-01-23 DIAGNOSIS — Z95810 Presence of automatic (implantable) cardiac defibrillator: Secondary | ICD-10-CM

## 2018-01-23 NOTE — Patient Instructions (Addendum)
Medication Instructions:  Your physician recommends that you continue on your current medications as directed. Please refer to the Current Medication list given to you today.  Labwork: None ordered.  Testing/Procedures: None ordered.  Follow-Up: Your physician wants you to follow-up in: one year with Dr. Lovena Le in [place].   You will receive a reminder letter in the mail two months in advance. If you don't receive a letter, please call our office to schedule the follow-up appointment.  Remote monitoring is used to monitor your ICD from home. This monitoring reduces the number of office visits required to check your device to one time per year. It allows [place] to keep an eye on the functioning of your device to ensure it is working properly. You are scheduled for a device check from home on 02/05/2018. You may send your transmission at any time that day. If you have a wireless device, the transmission will be sent automatically. After your physician reviews your transmission, you will receive a postcard with your next transmission date.  Any Other Special Instructions Will Be Listed Below (If Applicable).  If you need a refill on your cardiac medications before your next appointment, please call your pharmacy.

## 2018-01-24 DIAGNOSIS — R0902 Hypoxemia: Secondary | ICD-10-CM | POA: Diagnosis not present

## 2018-01-24 DIAGNOSIS — G4733 Obstructive sleep apnea (adult) (pediatric): Secondary | ICD-10-CM | POA: Diagnosis not present

## 2018-01-25 ENCOUNTER — Encounter: Payer: Self-pay | Admitting: Internal Medicine

## 2018-01-25 NOTE — Progress Notes (Signed)
HPI [name] returns today for followup. He is a pleasant 67 yo man with multiple medical problems who has had both atrial flutter and SVT (AVNRT). He underwent EP study and catheter ablation of both arrhythmias several months ago and then ICD insertion due to severe LV dysfunction. In the interim, he has developed some atrial fib. He has Lv dysfunction which improved some after his ablation. He has been compliant on good medical therapy. CHF symptoms are class 2. He is s/p ICD insertion in 4/18. No ICD shocks. No Known Allergies   Current Outpatient Medications  Medication Sig Dispense Refill  . acetaminophen (TYLENOL) 500 MG tablet Take 500-1,000 mg by mouth 3 (three) times daily as needed for moderate pain or headache.    . albuterol (PROAIR HFA) 108 (90 BASE) MCG/ACT inhaler Inhale 2 puffs into the lungs every 6 (six) hours as needed for wheezing or shortness of breath.     . allopurinol (ZYLOPRIM) 300 MG tablet Take 300 mg by mouth daily.      Marland Kitchen alum & mag hydroxide-simeth (MAALOX/MYLANTA) 200-200-20 MG/5ML suspension Take 15 mLs by mouth every 6 (six) hours as needed for indigestion or heartburn.    Marland Kitchen atorvastatin (LIPITOR) 20 MG tablet Take 20 mg by mouth at bedtime.    . cholecalciferol (VITAMIN D) 1000 units tablet Take 1,000 Units by mouth daily.    Marland Kitchen gabapentin (NEURONTIN) 400 MG capsule Take 400 mg by mouth 3 (three) times daily. Takes at noon & bedtime    . HYDROcodone-acetaminophen (NORCO/VICODIN) 5-325 MG tablet Take 1 tablet by mouth every 6 (six) hours as needed for severe pain. 10 tablet 0  . insulin lispro (HUMALOG KWIKPEN) 100 UNIT/ML KiwkPen Inject 2-18 Units into the skin as directed. Inject 2 to 18 units based on sliding scale as directed as needed for blood glucose over 200    . isosorbide mononitrate (IMDUR) 30 MG 24 hr tablet TAKE 1 TABLET DAILY 90 tablet 0  . LINZESS 290 MCG CAPS capsule TAKE 1 CAPSULE ONCE DAILY 30 MINUTES BEFORE BREAKFAST 90 capsule 3  .  loperamide (IMODIUM) 2 MG capsule Take 2 mg by mouth daily as needed for constipation.    Marland Kitchen loratadine (CLARITIN) 10 MG tablet Take 10 mg by mouth daily.    . metoprolol succinate (TOPROL-XL) 100 MG 24 hr tablet Take 1 tablet (100 mg total) by mouth 2 (two) times daily. Take with or immediately following a meal. 60 tablet 3  . Multiple Vitamin (MULTIVITAMIN WITH MINERALS) TABS tablet Take 1 tablet by mouth daily.    . ondansetron (ZOFRAN) 4 MG tablet Take 1 tablet (4 mg total) by mouth every 8 (eight) hours as needed for nausea or vomiting. 30 tablet 1  . pantoprazole (PROTONIX) 40 MG tablet Take 1 tablet (40 mg total) by mouth daily.    . vitamin C (ASCORBIC ACID) 500 MG tablet Take 500 mg by mouth 2 (two) times daily.    . furosemide (LASIX) 40 MG tablet Take 1 tablet (40 mg total) by mouth 2 (two) times daily. 180 tablet 3  . potassium chloride SA (KLOR-CON M20) 20 MEQ tablet Take 1 tablet (20 mEq total) by mouth daily. 90 tablet 3   No current facility-administered medications for this visit.      Past Medical History:  Diagnosis Date  . Adenomatous colon polyp 10/30/09   Serrated adenoma removed during Colonoscopy   . AICD (automatic cardioverter/defibrillator) present 06/11/2016  . Arthritis    "  legs" (09/27/2015)  . Atrial flutter with rapid ventricular response ([place]) 09/27/2015  . CHF (congestive heart failure) ([place])   . CKD (chronic kidney disease) stage 3, GFR 30-59 ml/min (HCC)   . COPD (chronic obstructive pulmonary disease) ([place])   . Diverticulosis of colon   . Dysrhythmia   . Essential hypertension   . Gastroparesis   . GERD (gastroesophageal reflux disease)   . Gout   . Hyperlipemia   . Hypertension   . OA (osteoarthritis)   . Type 2 diabetes mellitus (HCC)     ROS:   All systems reviewed and negative except as noted in the HPI.   Past Surgical History:  Procedure Laterality Date  . CARDIOVERSION N/A 08/22/2015   Procedure: CARDIOVERSION;  Surgeon: Larey Dresser, MD;  Location: [place];  Service: Cardiovascular;  Laterality: N/A;  . CARDIOVERSION N/A 09/01/2015   Procedure: CARDIOVERSION;  Surgeon: Lelon Perla, MD;  Location: [place];  Service: Cardiovascular;  Laterality: N/A;  . CATARACT EXTRACTION W/ INTRAOCULAR LENS  IMPLANT, BILATERAL Bilateral   . COLONOSCOPY  10/30/09   YBO:FBPZ-WCHEN diverticulum/serrated adenoma from ICV, next colonoscopy due 10/2012  . COLONOSCOPY N/A 09/18/2012   IDP:OEUMPNT mucosa that was seen appeared normal, however most of it was not seen due to be very poor prep  . COLONOSCOPY N/A 04/08/2013   IRW:ERXVQMG polyp removed/inadequate preparation  . COLONOSCOPY N/A 06/22/2014   Procedure: COLONOSCOPY;  Surgeon: Daneil Dolin, MD;  [facility];  Service: Endoscopy;  Laterality: N/A;  115   . ELECTROPHYSIOLOGIC STUDY N/A 09/27/2015   Procedure: SVT Ablation;  Surgeon: Evans Lance, MD;  Location: [place] [facility];  Service: Cardiovascular;  Laterality: N/A;  . EP IMPLANTABLE DEVICE  06/11/2016  . ESOPHAGOGASTRODUODENOSCOPY  10/30/09   QQP:YPPJKD   . FLEXIBLE SIGMOIDOSCOPY N/A 08/25/2015   Procedure: FLEXIBLE SIGMOIDOSCOPY;  Surgeon: Jerene Bears, MD;  Location[facility];  Service: Endoscopy;  Laterality: N/A;  . ICD IMPLANT N/A 06/11/2016   Procedure: ICD Implant;  Surgeon: Evans Lance, MD;  Location: [place] [facility];  Service: Cardiovascular;  Laterality: N/A;  . TEE WITHOUT CARDIOVERSION N/A 08/22/2015   Procedure: TRANSESOPHAGEAL ECHOCARDIOGRAM (TEE);  Surgeon: Larey Dresser, MD;  Location: [place];  Service: Cardiovascular;  Laterality: N/A;  . TEE WITHOUT CARDIOVERSION  09/27/2015   Procedure: Transesophageal Echocardiogram (Tee);  Surgeon: Evans Lance, MD;  Location: [place] [facility];  Service: Cardiovascular;;  . TONSILLECTOMY  1950s/1960s     Family History  Problem Relation Age of Onset  . COPD Father   . Diabetes Mother   . Hypertension Mother   . Colon  cancer Neg Hx      Social History   Socioeconomic History  . Marital status: Married    Spouse name: Not on file  . Number of children: 2  . Years of education: Not on file  . Highest education level: Not on file  Occupational History  . Occupation: retired  Scientific laboratory technician  . Financial resource strain: Not on file  . Food insecurity:    Worry: Not on file    Inability: Not on file  . Transportation needs:    Medical: Not on file    Non-medical: Not on file  Tobacco Use  . Smoking status: Former Smoker    Packs/day: 1.00    Years: 4.00    Pack years: 4.00    Types: Cigarettes    Last attempt to quit: 08/26/2010  Years since quitting: 7.4  . Smokeless tobacco: Never Used  Substance and Sexual Activity  . Alcohol use: No    Alcohol/week: 0.0 standard drinks    Comment: "stopped in 2012 when I quit smoking"  . Drug use: No  . Sexual activity: Not Currently  Lifestyle  . Physical activity:    Days per week: Not on file    Minutes per session: Not on file  . Stress: Not on file  Relationships  . Social connections:    Talks on phone: Not on file    Gets together: Not on file    Attends religious service: Not on file    Active member of club or organization: Not on file    Attends meetings of clubs or organizations: Not on file    Relationship status: Not on file  . Intimate partner violence:    Fear of current or ex partner: Not on file    Emotionally abused: Not on file    Physically abused: Not on file    Forced sexual activity: Not on file  Other Topics Concern  . Not on file  Social History Narrative  . Not on file     BP 122/76   Pulse 77   Ht 5' 6"  (1.676 m)   Wt 246 lb (111.6 kg)   SpO2 99%   BMI 39.71 kg/m   Physical Exam:  Well appearing middle aged man, NAD HEENT: Unremarkable Neck:  No JVD, no thyromegally Lymphatics:  No adenopathy Back:  No CVA tenderness Lungs:  Clear with no wheezes HEART:  Regular rate rhythm, no murmurs, no rubs,  no clicks Abd:  soft, positive bowel sounds, no organomegally, no rebound, no guarding Ext:  2 plus pulses, no edema, no cyanosis, no clubbing Skin:  No rashes no nodules Neuro:  CN II through XII intact, motor grossly intact  EKG - none  DEVICE  Normal device function.  See PaceArt for details.   Assess/Plan: 1. ICD - his [place] ICD is working normally. We will recheck in several months.  2. Atrial fib - he is in and out of rhythm. We will follow.  3. Chronic systolic heart failure - his symptoms are class 2. Continue his current meds.  Frank Hoover.D.

## 2018-01-26 DIAGNOSIS — R809 Proteinuria, unspecified: Secondary | ICD-10-CM | POA: Diagnosis not present

## 2018-01-26 DIAGNOSIS — D509 Iron deficiency anemia, unspecified: Secondary | ICD-10-CM | POA: Diagnosis not present

## 2018-01-26 DIAGNOSIS — E559 Vitamin D deficiency, unspecified: Secondary | ICD-10-CM | POA: Diagnosis not present

## 2018-01-26 DIAGNOSIS — Z79899 Other long term (current) drug therapy: Secondary | ICD-10-CM | POA: Diagnosis not present

## 2018-01-26 DIAGNOSIS — N183 Chronic kidney disease, stage 3 (moderate): Secondary | ICD-10-CM | POA: Diagnosis not present

## 2018-01-28 DIAGNOSIS — N184 Chronic kidney disease, stage 4 (severe): Secondary | ICD-10-CM | POA: Diagnosis not present

## 2018-01-28 DIAGNOSIS — E559 Vitamin D deficiency, unspecified: Secondary | ICD-10-CM | POA: Diagnosis not present

## 2018-01-28 DIAGNOSIS — R809 Proteinuria, unspecified: Secondary | ICD-10-CM | POA: Diagnosis not present

## 2018-01-28 DIAGNOSIS — D638 Anemia in other chronic diseases classified elsewhere: Secondary | ICD-10-CM | POA: Diagnosis not present

## 2018-02-01 DIAGNOSIS — I509 Heart failure, unspecified: Secondary | ICD-10-CM | POA: Diagnosis not present

## 2018-02-01 DIAGNOSIS — G4733 Obstructive sleep apnea (adult) (pediatric): Secondary | ICD-10-CM | POA: Diagnosis not present

## 2018-02-05 ENCOUNTER — Ambulatory Visit (INDEPENDENT_AMBULATORY_CARE_PROVIDER_SITE_OTHER): Payer: Medicare Other

## 2018-02-05 DIAGNOSIS — E1142 Type 2 diabetes mellitus with diabetic polyneuropathy: Secondary | ICD-10-CM | POA: Diagnosis not present

## 2018-02-05 DIAGNOSIS — I255 Ischemic cardiomyopathy: Secondary | ICD-10-CM | POA: Diagnosis not present

## 2018-02-05 DIAGNOSIS — B351 Tinea unguium: Secondary | ICD-10-CM | POA: Diagnosis not present

## 2018-02-05 DIAGNOSIS — L84 Corns and callosities: Secondary | ICD-10-CM | POA: Diagnosis not present

## 2018-02-05 DIAGNOSIS — M79676 Pain in unspecified toe(s): Secondary | ICD-10-CM | POA: Diagnosis not present

## 2018-02-05 NOTE — Progress Notes (Signed)
Remote ICD transmission.   

## 2018-02-09 ENCOUNTER — Other Ambulatory Visit: Payer: Self-pay | Admitting: Internal Medicine

## 2018-02-10 NOTE — Telephone Encounter (Signed)
This is a Stem pt.  °

## 2018-02-11 ENCOUNTER — Encounter: Payer: Self-pay | Admitting: Cardiology

## 2018-02-11 DIAGNOSIS — N184 Chronic kidney disease, stage 4 (severe): Secondary | ICD-10-CM | POA: Diagnosis not present

## 2018-02-11 DIAGNOSIS — E1121 Type 2 diabetes mellitus with diabetic nephropathy: Secondary | ICD-10-CM | POA: Diagnosis not present

## 2018-02-11 DIAGNOSIS — G4733 Obstructive sleep apnea (adult) (pediatric): Secondary | ICD-10-CM | POA: Diagnosis not present

## 2018-02-11 DIAGNOSIS — I110 Hypertensive heart disease with heart failure: Secondary | ICD-10-CM | POA: Diagnosis not present

## 2018-02-17 DIAGNOSIS — I110 Hypertensive heart disease with heart failure: Secondary | ICD-10-CM | POA: Diagnosis not present

## 2018-02-17 DIAGNOSIS — G4733 Obstructive sleep apnea (adult) (pediatric): Secondary | ICD-10-CM | POA: Diagnosis not present

## 2018-02-17 DIAGNOSIS — E1121 Type 2 diabetes mellitus with diabetic nephropathy: Secondary | ICD-10-CM | POA: Diagnosis not present

## 2018-02-17 DIAGNOSIS — N184 Chronic kidney disease, stage 4 (severe): Secondary | ICD-10-CM | POA: Diagnosis not present

## 2018-02-23 DIAGNOSIS — R0902 Hypoxemia: Secondary | ICD-10-CM | POA: Diagnosis not present

## 2018-02-23 DIAGNOSIS — G4733 Obstructive sleep apnea (adult) (pediatric): Secondary | ICD-10-CM | POA: Diagnosis not present

## 2018-03-04 DIAGNOSIS — G4733 Obstructive sleep apnea (adult) (pediatric): Secondary | ICD-10-CM | POA: Diagnosis not present

## 2018-03-04 DIAGNOSIS — I509 Heart failure, unspecified: Secondary | ICD-10-CM | POA: Diagnosis not present

## 2018-03-26 DIAGNOSIS — G4733 Obstructive sleep apnea (adult) (pediatric): Secondary | ICD-10-CM | POA: Diagnosis not present

## 2018-03-26 DIAGNOSIS — R0902 Hypoxemia: Secondary | ICD-10-CM | POA: Diagnosis not present

## 2018-03-28 LAB — CUP PACEART REMOTE DEVICE CHECK
Date Time Interrogation Session: 20200125071701
Implantable Lead Implant Date: 20180410
Implantable Lead Implant Date: 20180410
Implantable Lead Location: 753859
Implantable Lead Location: 753860
Implantable Lead Model: 292
Implantable Lead Model: 7740
Implantable Lead Serial Number: 420134
Implantable Lead Serial Number: 693412
Implantable Pulse Generator Implant Date: 20180410
Pulse Gen Serial Number: 533916

## 2018-04-01 LAB — CUP PACEART INCLINIC DEVICE CHECK
Brady Statistic RA Percent Paced: 2 %
Brady Statistic RV Percent Paced: 3 %
Date Time Interrogation Session: 20200129164451
HighPow Impedance: 63 Ohm
Implantable Lead Implant Date: 20180410
Implantable Lead Implant Date: 20180410
Implantable Lead Location: 753859
Implantable Lead Location: 753860
Implantable Lead Model: 292
Implantable Lead Model: 7740
Implantable Lead Serial Number: 420134
Implantable Lead Serial Number: 693412
Implantable Pulse Generator Implant Date: 20180410
Lead Channel Impedance Value: 399 Ohm
Lead Channel Impedance Value: 605 Ohm
Lead Channel Pacing Threshold Amplitude: 0.5 V
Lead Channel Pacing Threshold Amplitude: 1.2 V
Lead Channel Pacing Threshold Pulse Width: 0.4 ms
Lead Channel Pacing Threshold Pulse Width: 0.4 ms
Lead Channel Sensing Intrinsic Amplitude: 16.5 mV
Lead Channel Sensing Intrinsic Amplitude: 3.2 mV
Lead Channel Setting Pacing Amplitude: 3.5 V
Lead Channel Setting Pacing Amplitude: 3.5 V
Lead Channel Setting Pacing Pulse Width: 0.4 ms
Lead Channel Setting Sensing Sensitivity: 0.5 mV
Pulse Gen Serial Number: 533916

## 2018-04-04 DIAGNOSIS — I509 Heart failure, unspecified: Secondary | ICD-10-CM | POA: Diagnosis not present

## 2018-04-04 DIAGNOSIS — G4733 Obstructive sleep apnea (adult) (pediatric): Secondary | ICD-10-CM | POA: Diagnosis not present

## 2018-04-26 DIAGNOSIS — R0902 Hypoxemia: Secondary | ICD-10-CM | POA: Diagnosis not present

## 2018-04-26 DIAGNOSIS — G4733 Obstructive sleep apnea (adult) (pediatric): Secondary | ICD-10-CM | POA: Diagnosis not present

## 2018-05-02 ENCOUNTER — Other Ambulatory Visit: Payer: Self-pay | Admitting: Adult Health

## 2018-05-02 ENCOUNTER — Other Ambulatory Visit: Payer: Self-pay | Admitting: Internal Medicine

## 2018-05-03 DIAGNOSIS — I509 Heart failure, unspecified: Secondary | ICD-10-CM | POA: Diagnosis not present

## 2018-05-03 DIAGNOSIS — G4733 Obstructive sleep apnea (adult) (pediatric): Secondary | ICD-10-CM | POA: Diagnosis not present

## 2018-05-06 ENCOUNTER — Telehealth: Payer: Self-pay | Admitting: Cardiology

## 2018-05-06 MED ORDER — POTASSIUM CHLORIDE CRYS ER 20 MEQ PO TBCR: 20.0000 meq | EXTENDED_RELEASE_TABLET | Freq: Every day | ORAL | 0 refills | Status: DC

## 2018-05-06 NOTE — Telephone Encounter (Signed)
Done

## 2018-05-06 NOTE — Telephone Encounter (Signed)
Needing refill for potassium chloride SA (KLOR-CON M20) 20 MEQ tablet [[id number]] ENDED   sent to [facility], scheduled to see Branch on 07/03/2018

## 2018-05-07 ENCOUNTER — Ambulatory Visit (INDEPENDENT_AMBULATORY_CARE_PROVIDER_SITE_OTHER): Payer: Medicare Other | Admitting: *Deleted

## 2018-05-07 DIAGNOSIS — I255 Ischemic cardiomyopathy: Secondary | ICD-10-CM | POA: Diagnosis not present

## 2018-05-09 LAB — CUP PACEART REMOTE DEVICE CHECK
Battery Remaining Longevity: 156 mo
Battery Remaining Percentage: 100 %
Brady Statistic RA Percent Paced: 0 %
Brady Statistic RV Percent Paced: 1 %
Date Time Interrogation Session: 20200305080100
HighPow Impedance: 68 Ohm
Implantable Lead Implant Date: 20180410
Implantable Lead Implant Date: 20180410
Implantable Lead Location: 753859
Implantable Lead Location: 753860
Implantable Lead Model: 292
Implantable Lead Model: 7740
Implantable Lead Serial Number: 420134
Implantable Lead Serial Number: 693412
Implantable Pulse Generator Implant Date: 20180410
Lead Channel Impedance Value: 398 Ohm
Lead Channel Impedance Value: 617 Ohm
Lead Channel Setting Pacing Amplitude: 2 V
Lead Channel Setting Pacing Amplitude: 2.4 V
Lead Channel Setting Pacing Pulse Width: 0.4 ms
Lead Channel Setting Sensing Sensitivity: 0.5 mV
Pulse Gen Serial Number: 533916

## 2018-05-18 ENCOUNTER — Encounter: Payer: Self-pay | Admitting: Cardiology

## 2018-05-18 DIAGNOSIS — N183 Chronic kidney disease, stage 3 (moderate): Secondary | ICD-10-CM | POA: Diagnosis not present

## 2018-05-18 DIAGNOSIS — D509 Iron deficiency anemia, unspecified: Secondary | ICD-10-CM | POA: Diagnosis not present

## 2018-05-18 DIAGNOSIS — I10 Essential (primary) hypertension: Secondary | ICD-10-CM | POA: Diagnosis not present

## 2018-05-18 DIAGNOSIS — Z79899 Other long term (current) drug therapy: Secondary | ICD-10-CM | POA: Diagnosis not present

## 2018-05-18 DIAGNOSIS — R809 Proteinuria, unspecified: Secondary | ICD-10-CM | POA: Diagnosis not present

## 2018-05-18 NOTE — Progress Notes (Signed)
Remote ICD transmission.   

## 2018-05-20 ENCOUNTER — Telehealth: Payer: Self-pay

## 2018-05-20 ENCOUNTER — Other Ambulatory Visit: Payer: Self-pay

## 2018-05-20 ENCOUNTER — Ambulatory Visit (INDEPENDENT_AMBULATORY_CARE_PROVIDER_SITE_OTHER): Payer: Medicare Other

## 2018-05-20 DIAGNOSIS — I5022 Chronic systolic (congestive) heart failure: Secondary | ICD-10-CM

## 2018-05-20 DIAGNOSIS — I10 Essential (primary) hypertension: Secondary | ICD-10-CM

## 2018-05-20 DIAGNOSIS — Z95810 Presence of automatic (implantable) cardiac defibrillator: Secondary | ICD-10-CM

## 2018-05-20 NOTE — Telephone Encounter (Signed)
Spoke with patient due to Heartlogic Heart Failure Index remote alert was 17 and normal threshold is 6.    Patient reports eating foods such as deli meats and cheese which are high in sodium.  His nephrologist appointment was canceled today by the office and will be rescheduled.  His nephrologist is Dr Hinda Lenis.   SYMPTOMS: Reports weight gain in last week of > 3 lbs but unsure of exactly how much.  He feels like he has abdominal bloating.   No shortness of breath or lower extremity swelling.  LATITUDE REPORT: See cc'd ICM chart note for details.  Heartlogic Heart Failure Index alert suggesting fluid accumulation since 05/12/2018.    PRESCRIBED: Furosemide 40 mg 1 tablet twice a day.        05/18/2018 Creatinine 2.52, BUN 32, Potassium 3.5, Sodium 139, GFR 26-30, Care Everywhere  RECOMMENDATIONS:  Advised patient would send information to Dr Harl Bowie for review and if any recommendations will call back.    Recheck fluid levels and remote transmission scheduled 05/25/2018.  Please advise if any changes are recommended. Will call patient back if changes are advised.

## 2018-05-20 NOTE — Progress Notes (Signed)
EPIC Encounter for ICM Monitoring  Patient Name: Frank Hoover is a 69 y.o. male Date: 05/20/2018 Primary Care Physican: Sinda Du, MD Primary Cardiologist: Harl Bowie Electrophysiologist: Lovena Le Nephrologist: Dr Hinda Lenis Today's Weight: 239 lbs        1st ICM remote. Patient referred by Chanetta Marshall, NP due to Heartlogic Index Score elevated.      Heart Failure questions reviewed, pt symptomatic with weight gain.  He thinks he has gained more than 3 pounds in the last week but unsure exactly how much.  He reports breathing is fine and no lower extremity swelling. He does eat salty foods such as cheese and deli meats.   HeartLogic Heart Failure Index is 17 suggesting fluid accumulation.  Prescribed: Furosemide 40 mg take 1 tablet twice a day (he confirmed this dosage 05/20/2018).   Labs: 05/18/2018 Creatinine 2.52, BUN 32, Potassium 3.5, Sodium 139, GFR 26-30, Care Everywhere 01/26/2018 Creatinine 2.88, BUN 45, Potassium 3.7, Sodium 134, GFR 22-25, Care Everywhere  10/07/2017 Creatinine 2.40, BUN 34, Potassium 3.9, Sodium 139, GFR 27-32, Care Everywhere  06/27/2017 Creatinine 2.32, BUN 37, Potassium 3.8, Sodium 141, GFR 28-33, Care Everywhere  A complete set of results can be found in Results Review.  Recommendations: Advised to limit salt intake to 2000 mg/day and fluid intake to < 2 liters/day.   Follow-up plan: ICM clinic phone appointment on 05/25/2018 to recheck fluid levels.  Office appointment scheduled 07/03/2018 with Dr. Harl Bowie.        Copy of ICM check sent to Dr. Lovena Le. Phone note sent to Dr Harl Bowie for recommendations if needed.      [place], RN 05/20/2018 8:31 AM

## 2018-05-21 MED ORDER — FUROSEMIDE 40 MG PO TABS: 40.0000 mg | ORAL_TABLET | Freq: Two times a day (BID) | ORAL | 1 refills | Status: DC

## 2018-05-21 NOTE — Telephone Encounter (Signed)
No, please have him take 63m bid, can we call him Monday and check on his symptoms and weights   J Todd Argabright MD

## 2018-05-21 NOTE — Telephone Encounter (Signed)
Staci can you check on this patient, if he has been taking lasix 78m bid please increase to 674mbid. Needs BMET/Mg in 2 weeks   J Zandra AbtsD

## 2018-05-21 NOTE — Telephone Encounter (Signed)
Pt confirmed he is only take lasix 20 mg daily - still increase to 60 bid?

## 2018-05-21 NOTE — Telephone Encounter (Signed)
Pt voiced understanding requested new rx sent to [facility] - will call us back on Monday with an update - pt mailed lab orders as requested

## 2018-05-22 NOTE — Progress Notes (Signed)
Dr Nelly Laurence recommendations are have him take 40mg  bid, call him Monday and check on his symptoms and weights (see 05/20/2018 phone note).  Instructions called to patient by Julian Hy, CMA.

## 2018-05-25 ENCOUNTER — Ambulatory Visit (INDEPENDENT_AMBULATORY_CARE_PROVIDER_SITE_OTHER): Payer: Medicare Other

## 2018-05-25 ENCOUNTER — Other Ambulatory Visit: Payer: Self-pay

## 2018-05-25 DIAGNOSIS — G4733 Obstructive sleep apnea (adult) (pediatric): Secondary | ICD-10-CM | POA: Diagnosis not present

## 2018-05-25 DIAGNOSIS — Z95810 Presence of automatic (implantable) cardiac defibrillator: Secondary | ICD-10-CM

## 2018-05-25 DIAGNOSIS — R0902 Hypoxemia: Secondary | ICD-10-CM | POA: Diagnosis not present

## 2018-05-25 DIAGNOSIS — I5022 Chronic systolic (congestive) heart failure: Secondary | ICD-10-CM

## 2018-05-26 NOTE — Progress Notes (Signed)
EPIC Encounter for ICM Monitoring  Patient Name: Frank Hoover is a 68 y.o. male Date: 05/26/2018 Primary Care Physican: Sinda Du, MD Primary Cardiologist: Harl Bowie Electrophysiologist: Lovena Le Nephrologist: Dr Hinda Lenis Previous Weight: 239 lbs 05/26/2018 Weight: Unknown - patient not weighing at home.                                                                         Heart Failure questions reviewed.  Discussed following with patient.   1. Patient asymptomatic.  He is not weighing. Provided instructions when to weigh and to record daily.    2. He does not follow low salt diet.  He reports wife cooks. Advised to have wife read labels for high sodium foods.  Advised should avoid snacks such as potato chips, crackers along with foods such as cheese and deli meats.  3. Pt does not have a good understanding of meds. He receives pills in blister pack every Wednesday from [facility].  I contacted pharmacist and confirmed pt was given separate Furosemide tablets in addition to the blister pack (because blister pack was already done when new order came in) so he could take 40 mg twice a day.  Pharmacist reports patient has difficulty taking meds correctly which is why he has blister packs.  He will receive blister pack 3/25 with Furosemide bid included. Pharmacist unsure if patient did take the extra loose Furosemide pills given last week as instructed.       HeartLogic Heart Failure Index remains 17 suggesting fluid accumulation since 05/12/2018.   No change in index since 05/20/2018 remote transmission.  Prescribed: Furosemide 40 mg take 1 tablet twice a day.    Labs: 05/18/2018 Creatinine 2.52, BUN 32, Potassium 3.5, Sodium 139, GFR 26-30, Care Everywhere 01/26/2018 Creatinine 2.88, BUN 45, Potassium 3.7, Sodium 134, GFR 22-25, Care Everywhere  10/07/2017 Creatinine 2.40, BUN 34, Potassium 3.9, Sodium 139, GFR 27-32, Care Everywhere  06/27/2017 Creatinine 2.32, BUN 37, Potassium  3.8, Sodium 141, GFR 28-33, Care Everywhere  A complete set of results can be found in Results Review.  Recommendations:  It is not clear if patient took the 2nd dose of Furosemide tablets for the last week.  He was given the medication but not sure if he took it at home. 1. Weigh every morning as instructed and record weights 2. Ask wife to check food labels and limit salt to 2000 mg daily 3. Take Furosemide 40 mg 1 tablet twice a day that he will receive in blister packs on 05/27/2018.   Follow-up plan: ICM clinic phone appointment on 06/02/2018 to recheck fluid levels.  Office appointment scheduled 07/03/2018 with Dr. Harl Bowie.        Copy of ICM check sent to Dr. Lovena Le and phone note sent to Dr Harl Bowie for recommendations if needed.      [place], RN 05/26/2018 4:51 PM

## 2018-05-27 NOTE — Progress Notes (Signed)
Thanks for update and following him so closely, we will see how things look on Monday with recent lasix change, hopefully he will take as prescribed.     Zandra Abts MD

## 2018-06-03 DIAGNOSIS — G4733 Obstructive sleep apnea (adult) (pediatric): Secondary | ICD-10-CM | POA: Diagnosis not present

## 2018-06-03 DIAGNOSIS — I509 Heart failure, unspecified: Secondary | ICD-10-CM | POA: Diagnosis not present

## 2018-06-08 ENCOUNTER — Ambulatory Visit (INDEPENDENT_AMBULATORY_CARE_PROVIDER_SITE_OTHER): Payer: Medicare Other

## 2018-06-08 ENCOUNTER — Other Ambulatory Visit: Payer: Self-pay

## 2018-06-08 DIAGNOSIS — Z95810 Presence of automatic (implantable) cardiac defibrillator: Secondary | ICD-10-CM

## 2018-06-08 DIAGNOSIS — I5022 Chronic systolic (congestive) heart failure: Secondary | ICD-10-CM

## 2018-06-08 NOTE — Progress Notes (Signed)
EPIC Encounter for ICM Monitoring  Patient Name: Frank Hoover is a 68 y.o. male Date: 06/08/2018 Primary Care Physican: Sinda Du, MD Primary Cardiologist:Branch Electrophysiologist:Taylor Nephrologist:Dr Befakadu Previous Weight:239lbs 06/08/2018 Weight: 240 lbs   Heart Failure questions reviewed.  Patient denied any swelling of lower extremities, no shortness of breath and weight stable at 240 lbs.  He has been urinating more.  He reports feeling fine at this time.    HeartLogic Heart Failure Index improving and deceased from 60 to 48.  Thoracic impedance improving.    Prescribed: Furosemide40 mg take 1 tablet twice a day (all meds are in blister packs)   Labs: 05/18/2018 Creatinine2.52, BUN32, Potassium3.5, [id number], [id number], Care Everywhere 11/25/2019Creatinine 2.88, BUN45, Potassium3.7, Sodium134, [id number], Care Everywhere  08/06/2019Creatinine 2.40, BUN34, Potassium3.9, Sodium139, YSH68-37, Care Everywhere  04/26/2019Creatinine 2.32, BUN37, Potassium3.8, Sodium141, GBM21-11, Care Everywhere A complete set of results can be found in Results Review.  Recommendations: Advise to review food labels and avoid foods such as potato chips, crackers and other foods high in salt.     Follow-up plan: ICM clinic phone appointment on4/27/2020and will continue to monitor for alerts. Office appointment scheduled 07/03/2018 with Dr.Branch.  Copy of ICM check sent to Dr. Lovena Le and Dr Harl Bowie (to show improvement of heart index)      8 Day [place], RN 06/08/2018 3:22 PM

## 2018-06-10 DIAGNOSIS — I10 Essential (primary) hypertension: Secondary | ICD-10-CM | POA: Diagnosis not present

## 2018-06-11 LAB — BASIC METABOLIC PANEL WITH GFR
BUN/Creatinine Ratio: 10 (ref 10–24)
BUN: 33 mg/dL — ABNORMAL HIGH (ref 8–27)
CO2: 25 mmol/L (ref 20–29)
Calcium: 9.8 mg/dL (ref 8.6–10.2)
Chloride: 89 mmol/L — ABNORMAL LOW (ref 96–106)
Creatinine, Ser: 3.38 mg/dL — ABNORMAL HIGH (ref 0.76–1.27)
GFR calc Af Amer: 21 mL/min/1.73 — ABNORMAL LOW
GFR calc non Af Amer: 18 mL/min/1.73 — ABNORMAL LOW
Glucose: 196 mg/dL — ABNORMAL HIGH (ref 65–99)
Potassium: 3.5 mmol/L (ref 3.5–5.2)
Sodium: 136 mmol/L (ref 134–144)

## 2018-06-11 LAB — MAGNESIUM: Magnesium: 2.2 mg/dL (ref 1.6–2.3)

## 2018-06-12 ENCOUNTER — Telehealth: Payer: Self-pay | Admitting: *Deleted

## 2018-06-12 ENCOUNTER — Other Ambulatory Visit: Payer: Self-pay | Admitting: *Deleted

## 2018-06-12 DIAGNOSIS — I5022 Chronic systolic (congestive) heart failure: Secondary | ICD-10-CM

## 2018-06-12 MED ORDER — FUROSEMIDE 40 MG PO TABS: 40.0000 mg | ORAL_TABLET | ORAL | 1 refills | Status: DC

## 2018-06-12 NOTE — Telephone Encounter (Signed)
Patient, aide Micheal Adah Perl and [facility] informed and verbalized understanding of plan. Lab orders mailed to home address.

## 2018-06-12 NOTE — Telephone Encounter (Signed)
-----   Message from Arnoldo Lenis, MD sent at 06/12/2018  2:13 PM EDT ----- Labs showing some stres on the kidneys. Can he change his lasix to 40mg  in AM and 20mg  in PM. Repeat BMET/Mg in 2 weeks. Please make sure he understands, he has difficultly understanding his meds   Zandra Abts MD

## 2018-06-19 ENCOUNTER — Other Ambulatory Visit: Payer: Self-pay

## 2018-06-19 ENCOUNTER — Ambulatory Visit (INDEPENDENT_AMBULATORY_CARE_PROVIDER_SITE_OTHER): Payer: Medicare Other

## 2018-06-19 ENCOUNTER — Telehealth: Payer: Self-pay

## 2018-06-19 DIAGNOSIS — Z95810 Presence of automatic (implantable) cardiac defibrillator: Secondary | ICD-10-CM

## 2018-06-19 DIAGNOSIS — I5022 Chronic systolic (congestive) heart failure: Secondary | ICD-10-CM

## 2018-06-19 NOTE — Telephone Encounter (Signed)
Spoke with patient for ICM follow up.     SYMPTOMS: Denies any fluid symptoms.  Discussed diet and he is eating food from [employer] for breakfast and usually pimento cheese sandwiches later in the day.   He said he does drink a lot of water but he cannot see how many ounces is listed on water bottle.  He reports approximate weight is 241 lbs which is a 1 pound difference since last ICM call.  Left message with patient to have his aide, Legrand Como call to discuss fluid and food intake.  Heartlogic Heart Failure Index alert today due to thoracic impedance suggesting fluid accumulation.  The index increased from 11 to 16 starting 06/09/2018  PRESCRIBED: Furosemide40 mg take 1 tablet every morning and 0.5 tablet (20 mg total) every evening (decreased dosage by Dr Harl Bowie 4/10 in response to elevated Creatinine. All meds are in blister packs)  Labs 06/10/2018 Creatinine 3.38, BUN 33, Potassium 3.5, Sodium 136, GFR 18-21 05/18/2018 Creatinine2.52, BUN32, Potassium3.5, Sodium139, GFR26-30, Care Everywhere 11/25/2019Creatinine 2.88, BUN45, Potassium3.7, Sodium134, [id number], Care Everywhere  08/06/2019Creatinine 2.40, BUN34, Potassium3.9, Sodium139, ULA45-36, Care Everywhere  04/26/2019Creatinine 2.32, BUN37, Potassium3.8, Sodium141, IWO03-21, Care Everywhere A complete set of results can be found in Results Review.  RECOMMENDATIONS:  Advised patient would send information to Dr Harl Bowie for review and if any recommendations will call back.    Recheck fluid levels and remote transmission scheduled 06/29/2018 to recheck fluid levels.  Please advise if any changes are recommended. Will call patient back if changes are advised.

## 2018-06-19 NOTE — Progress Notes (Signed)
Call back to patient to check if his home health nurse has arrived.  He stated the nurse aide, Maura Crandall will call me to discuss his care.  Provided ICM number for call.

## 2018-06-19 NOTE — Telephone Encounter (Signed)
Thx for upate, would take his lasix 56m bid today, Sat, Sunday. Then Monday resume the 418min AM and 2075mn PM. Difficult balance with his fluid status and renal function    J BZandra Abts

## 2018-06-19 NOTE — Progress Notes (Signed)
    Arnoldo Lenis, MD  You 1 hour ago (1:42 PM)      Thx for upate, would take his lasix 40mg  bid today, Sat, Sunday. Then Monday resume the 40mg  in AM and 20mg  in PM. Difficult balance with his fluid status and renal function    Zandra Abts MD

## 2018-06-19 NOTE — Telephone Encounter (Signed)
Attempted call to patient to provide Dr Branches recommendation to take his lasix 40mg  bid today, Sat, Sunday. Then Monday resume the 40mg  in AM and 20mg  in PM.  Left message with phone number to return call.

## 2018-06-19 NOTE — Progress Notes (Signed)
EPIC Encounter for ICM Monitoring  Patient Name: Frank Hoover is a 69 y.o. male Date: 06/19/2018 Primary Care Physican: Sinda Du, MD Primary Cardiologist:Branch Electrophysiologist:Taylor Nephrologist:Dr Befakadu PreviousWeight:239lbs 06/08/2018 Weight: 240 lbs 06/19/2018 Weight: ~241 lbs    Heart Failure questions reviewed. He reports feeling fine at this time and denies any fluid symptoms.  He is eating food from [place] for breakfast and usually pimento cheese sandwiches later in the day.   He said he does drink a lot of water but unable to see how many ounces is in the bottle he drinks.  He said his nurse may come by today and I advised will call back in about 30 minutes to see if she has arrived so can discuss food and water intake with her and he agreed.    HeartLogic Heart Failure Index has increased from 11 to 16 and index started increasing 06/09/2018.  Thoracic impedance decreased suggesting fluid accumulation.   Prescribed: Furosemide40 mg take 1 tablet every morning and 0.5 tablet (20 mg total) every evening (decreased dosage by Dr Harl Bowie 4/10 in response to elevated Creatinine. All meds are in blister packs)  Labs: 06/10/2018 Creatinine 3.38, BUN 33, Potassium 3.5, Sodium 136, GFR 18-21 05/18/2018 Creatinine2.52, BUN32, Potassium3.5, Sodium139, GFR26-30, Care Everywhere 11/25/2019Creatinine 2.88, BUN45, Potassium3.7, Sodium134, FHL45-62, Care Everywhere  08/06/2019Creatinine 2.40, BUN34, Potassium3.9, Sodium139, BWL89-37, Care Everywhere  04/26/2019Creatinine 2.32, BUN37, Potassium3.8, Sodium141, DSK87-68, Care Everywhere A complete set of results can be found in Results Review.  Recommendations: Advised to limit or avoide restaurant foods since the are typically high in salt .   Follow-up plan: ICM clinic phone appointment on4/27/2020and will continue to monitor for alerts. Office appointment scheduled 07/03/2018 with Dr.Branch.   Copy of ICM check sent to Dr. Flossie Dibble Dr Harl Bowie.     [place], RN 06/19/2018 8:07 AM

## 2018-06-19 NOTE — Progress Notes (Signed)
Attempted 2 calls to patient to advise of Dr Nelly Laurence recommendations and no answer.  Left message with phone number to return call.

## 2018-06-22 NOTE — Telephone Encounter (Signed)
Received voice mail message from pt's nephew, Maura Crandall asking for return call to [phone].

## 2018-06-22 NOTE — Progress Notes (Signed)
Received voice mail message from pt's nephew, Maura Crandall asking for return call to [phone].

## 2018-06-22 NOTE — Telephone Encounter (Signed)
Call returned to nephew, Maura Crandall on [place].  Advised Dr Harl Bowie has made a dosage adjustment to patient's Furosemide and reason for adjustment.  Advised his current dosage in blister packs is 1 tablet 40 mg in the morning and 0.5 tablet 20 mg in the afternoon.  Advised Dr Harl Bowie recommended he take 1 tablet 40 mg every morning and every afternoon today, 4/20 through Wednesday 4/22.  Thursday 4/23 he should return to the dosage in the blister pack which should be 1 tablet (40 mg) every morning and 0.5 tablet (20 mg) in the afternoon.  He verbalized understanding of temporary dosage change. Reviewed HF symptoms and advised if patient has any symptoms or any medication questions to call back.  Discussed recommended diet which limits salt to 2000 mg daily and fluids to 64 oz daily.  He said patient is eating mostly take out foods such as Janine [place] due to they do not cook at home.  Advised restaurant foods are high in salt and try to avoid if possible.  Advised if pt has any urgent symptoms to use ER.   Will recheck fluid levels 06/29/2018.

## 2018-06-22 NOTE — Progress Notes (Signed)
Call returned to nephew, Maura Crandall on [place].  Advised Dr Harl Bowie has made a dosage adjustment to patient's Furosemide and reason for adjustment.  Advised his current dosage in blister packs is 1 tablet 40 mg in the morning and 0.5 tablet 20 mg in the afternoon.  Advised Dr Harl Bowie recommended he take 1 tablet 40 mg every morning and every afternoon today, 4/20 through Wednesday 4/22.  Thursday 4/23 he should return to the dosage in the blister pack which should be 1 tablet (40 mg) every morning and 0.5 tablet (20 mg) in the afternoon.  He verbalized understanding of temporary dosage change. Reviewed HF symptoms and advised if patient has any symptoms or any medication questions to call back.  Discussed recommended diet which limits salt to 2000 mg daily and fluids to 64 oz daily.  He said patient is eating mostly take out foods such as Janine Limbo due to they do not cook at home.  Advised restaurant foods are high in salt and try to avoid if possible.  Advised if pt has any urgent symptoms to use ER.   Will recheck fluid levels 06/29/2018.

## 2018-06-23 DIAGNOSIS — E1121 Type 2 diabetes mellitus with diabetic nephropathy: Secondary | ICD-10-CM | POA: Diagnosis not present

## 2018-06-23 DIAGNOSIS — N184 Chronic kidney disease, stage 4 (severe): Secondary | ICD-10-CM | POA: Diagnosis not present

## 2018-06-23 DIAGNOSIS — N390 Urinary tract infection, site not specified: Secondary | ICD-10-CM | POA: Diagnosis not present

## 2018-06-23 DIAGNOSIS — G4733 Obstructive sleep apnea (adult) (pediatric): Secondary | ICD-10-CM | POA: Diagnosis not present

## 2018-06-25 DIAGNOSIS — R0902 Hypoxemia: Secondary | ICD-10-CM | POA: Diagnosis not present

## 2018-06-25 DIAGNOSIS — G4733 Obstructive sleep apnea (adult) (pediatric): Secondary | ICD-10-CM | POA: Diagnosis not present

## 2018-06-26 ENCOUNTER — Encounter (HOSPITAL_COMMUNITY): Payer: Self-pay | Admitting: Emergency Medicine

## 2018-06-26 ENCOUNTER — Inpatient Hospital Stay (HOSPITAL_COMMUNITY)
Admission: EM | Admit: 2018-06-26 | Discharge: 2018-07-01 | DRG: 872 | Disposition: A | Discharge status: Home Health | Payer: Medicare Other | Attending: Pulmonary Disease | Admitting: Pulmonary Disease

## 2018-06-26 ENCOUNTER — Emergency Department (HOSPITAL_COMMUNITY): Payer: Medicare Other

## 2018-06-26 ENCOUNTER — Other Ambulatory Visit: Payer: Self-pay

## 2018-06-26 DIAGNOSIS — N136 Pyonephrosis: Secondary | ICD-10-CM | POA: Diagnosis not present

## 2018-06-26 DIAGNOSIS — I130 Hypertensive heart and chronic kidney disease with heart failure and stage 1 through stage 4 chronic kidney disease, or unspecified chronic kidney disease: Secondary | ICD-10-CM | POA: Diagnosis not present

## 2018-06-26 DIAGNOSIS — K529 Noninfective gastroenteritis and colitis, unspecified: Secondary | ICD-10-CM | POA: Diagnosis not present

## 2018-06-26 DIAGNOSIS — K219 Gastro-esophageal reflux disease without esophagitis: Secondary | ICD-10-CM | POA: Diagnosis not present

## 2018-06-26 DIAGNOSIS — E1159 Type 2 diabetes mellitus with other circulatory complications: Secondary | ICD-10-CM | POA: Diagnosis present

## 2018-06-26 DIAGNOSIS — Z79899 Other long term (current) drug therapy: Secondary | ICD-10-CM

## 2018-06-26 DIAGNOSIS — K5900 Constipation, unspecified: Secondary | ICD-10-CM | POA: Diagnosis not present

## 2018-06-26 DIAGNOSIS — E1143 Type 2 diabetes mellitus with diabetic autonomic (poly)neuropathy: Secondary | ICD-10-CM | POA: Diagnosis present

## 2018-06-26 DIAGNOSIS — I5020 Unspecified systolic (congestive) heart failure: Secondary | ICD-10-CM | POA: Diagnosis present

## 2018-06-26 DIAGNOSIS — N390 Urinary tract infection, site not specified: Secondary | ICD-10-CM | POA: Diagnosis present

## 2018-06-26 DIAGNOSIS — E1122 Type 2 diabetes mellitus with diabetic chronic kidney disease: Secondary | ICD-10-CM | POA: Diagnosis not present

## 2018-06-26 DIAGNOSIS — R739 Hyperglycemia, unspecified: Secondary | ICD-10-CM | POA: Diagnosis not present

## 2018-06-26 DIAGNOSIS — K5130 Ulcerative (chronic) rectosigmoiditis without complications: Secondary | ICD-10-CM | POA: Diagnosis not present

## 2018-06-26 DIAGNOSIS — K6389 Other specified diseases of intestine: Secondary | ICD-10-CM | POA: Diagnosis not present

## 2018-06-26 DIAGNOSIS — J449 Chronic obstructive pulmonary disease, unspecified: Secondary | ICD-10-CM | POA: Diagnosis not present

## 2018-06-26 DIAGNOSIS — I428 Other cardiomyopathies: Secondary | ICD-10-CM

## 2018-06-26 DIAGNOSIS — N183 Chronic kidney disease, stage 3 unspecified: Secondary | ICD-10-CM | POA: Diagnosis present

## 2018-06-26 DIAGNOSIS — A419 Sepsis, unspecified organism: Secondary | ICD-10-CM | POA: Diagnosis not present

## 2018-06-26 DIAGNOSIS — R6520 Severe sepsis without septic shock: Secondary | ICD-10-CM | POA: Diagnosis not present

## 2018-06-26 DIAGNOSIS — E876 Hypokalemia: Secondary | ICD-10-CM | POA: Diagnosis present

## 2018-06-26 DIAGNOSIS — K3184 Gastroparesis: Secondary | ICD-10-CM | POA: Diagnosis not present

## 2018-06-26 DIAGNOSIS — I5022 Chronic systolic (congestive) heart failure: Secondary | ICD-10-CM | POA: Diagnosis present

## 2018-06-26 DIAGNOSIS — F79 Unspecified intellectual disabilities: Secondary | ICD-10-CM | POA: Diagnosis present

## 2018-06-26 DIAGNOSIS — M109 Gout, unspecified: Secondary | ICD-10-CM | POA: Diagnosis present

## 2018-06-26 DIAGNOSIS — N179 Acute kidney failure, unspecified: Secondary | ICD-10-CM | POA: Diagnosis present

## 2018-06-26 DIAGNOSIS — E872 Acidosis, unspecified: Secondary | ICD-10-CM

## 2018-06-26 DIAGNOSIS — N3000 Acute cystitis without hematuria: Secondary | ICD-10-CM

## 2018-06-26 DIAGNOSIS — E1165 Type 2 diabetes mellitus with hyperglycemia: Secondary | ICD-10-CM | POA: Diagnosis not present

## 2018-06-26 DIAGNOSIS — E785 Hyperlipidemia, unspecified: Secondary | ICD-10-CM | POA: Diagnosis present

## 2018-06-26 DIAGNOSIS — Z95810 Presence of automatic (implantable) cardiac defibrillator: Secondary | ICD-10-CM | POA: Diagnosis not present

## 2018-06-26 DIAGNOSIS — G4730 Sleep apnea, unspecified: Secondary | ICD-10-CM | POA: Diagnosis present

## 2018-06-26 DIAGNOSIS — N1330 Unspecified hydronephrosis: Secondary | ICD-10-CM | POA: Diagnosis not present

## 2018-06-26 DIAGNOSIS — IMO0002 Reserved for concepts with insufficient information to code with codable children: Secondary | ICD-10-CM | POA: Diagnosis present

## 2018-06-26 DIAGNOSIS — R339 Retention of urine, unspecified: Secondary | ICD-10-CM

## 2018-06-26 DIAGNOSIS — R1030 Lower abdominal pain, unspecified: Secondary | ICD-10-CM

## 2018-06-26 DIAGNOSIS — I509 Heart failure, unspecified: Secondary | ICD-10-CM | POA: Diagnosis not present

## 2018-06-26 DIAGNOSIS — Z86010 Personal history of colonic polyps: Secondary | ICD-10-CM

## 2018-06-26 DIAGNOSIS — R0902 Hypoxemia: Secondary | ICD-10-CM | POA: Diagnosis not present

## 2018-06-26 DIAGNOSIS — G4733 Obstructive sleep apnea (adult) (pediatric): Secondary | ICD-10-CM | POA: Diagnosis not present

## 2018-06-26 DIAGNOSIS — R52 Pain, unspecified: Secondary | ICD-10-CM | POA: Diagnosis not present

## 2018-06-26 DIAGNOSIS — M6281 Muscle weakness (generalized): Secondary | ICD-10-CM | POA: Diagnosis not present

## 2018-06-26 DIAGNOSIS — R338 Other retention of urine: Secondary | ICD-10-CM | POA: Diagnosis not present

## 2018-06-26 DIAGNOSIS — K5909 Other constipation: Secondary | ICD-10-CM | POA: Diagnosis not present

## 2018-06-26 DIAGNOSIS — R197 Diarrhea, unspecified: Secondary | ICD-10-CM | POA: Diagnosis present

## 2018-06-26 DIAGNOSIS — E860 Dehydration: Secondary | ICD-10-CM | POA: Diagnosis present

## 2018-06-26 DIAGNOSIS — K5901 Slow transit constipation: Secondary | ICD-10-CM | POA: Diagnosis not present

## 2018-06-26 DIAGNOSIS — K8020 Calculus of gallbladder without cholecystitis without obstruction: Secondary | ICD-10-CM | POA: Diagnosis not present

## 2018-06-26 DIAGNOSIS — L8990 Pressure ulcer of unspecified site, unspecified stage: Secondary | ICD-10-CM

## 2018-06-26 DIAGNOSIS — Z794 Long term (current) use of insulin: Secondary | ICD-10-CM

## 2018-06-26 DIAGNOSIS — R531 Weakness: Secondary | ICD-10-CM | POA: Diagnosis not present

## 2018-06-26 DIAGNOSIS — Z7951 Long term (current) use of inhaled steroids: Secondary | ICD-10-CM

## 2018-06-26 DIAGNOSIS — Z87891 Personal history of nicotine dependence: Secondary | ICD-10-CM

## 2018-06-26 DIAGNOSIS — E118 Type 2 diabetes mellitus with unspecified complications: Secondary | ICD-10-CM

## 2018-06-26 DIAGNOSIS — R5381 Other malaise: Secondary | ICD-10-CM | POA: Diagnosis present

## 2018-06-26 LAB — TROPONIN I: Troponin I: 0.23 ng/mL (ref <0.03)

## 2018-06-26 LAB — CBC WITH DIFFERENTIAL/PLATELET
Abs Immature Granulocytes: 0.02 10*3/uL (ref 0.00–0.07)
Basophils Absolute: 0 10*3/uL (ref 0.0–0.1)
Basophils Relative: 0 %
Eosinophils Absolute: 0.2 10*3/uL (ref 0.0–0.5)
Eosinophils Relative: 2 %
HCT: 40.8 % (ref 39.0–52.0)
Hemoglobin: 14 g/dL (ref 13.0–17.0)
Immature Granulocytes: 0 %
Lymphocytes Relative: 8 %
Lymphs Abs: 0.8 10*3/uL (ref 0.7–4.0)
MCH: 31.5 pg (ref 26.0–34.0)
MCHC: 34.3 g/dL (ref 30.0–36.0)
MCV: 91.7 fL (ref 80.0–100.0)
Monocytes Absolute: 0.7 10*3/uL (ref 0.1–1.0)
Monocytes Relative: 7 %
Neutro Abs: 8.2 10*3/uL — ABNORMAL HIGH (ref 1.7–7.7)
Neutrophils Relative %: 83 %
Platelets: 199 10*3/uL (ref 150–400)
RBC: 4.45 MIL/uL (ref 4.22–5.81)
RDW: 15.4 % (ref 11.5–15.5)
WBC: 9.9 10*3/uL (ref 4.0–10.5)
nRBC: 0 % (ref 0.0–0.2)

## 2018-06-26 LAB — COMPREHENSIVE METABOLIC PANEL
ALT: <5 U/L (ref 0–44)
AST: 30 U/L (ref 15–41)
Albumin: 4.3 g/dL (ref 3.5–5.0)
Alkaline Phosphatase: 53 U/L (ref 38–126)
Anion gap: 20 — ABNORMAL HIGH (ref 5–15)
BUN: 43 mg/dL — ABNORMAL HIGH (ref 8–23)
CO2: 22 mmol/L (ref 22–32)
Calcium: 9 mg/dL (ref 8.9–10.3)
Chloride: 91 mmol/L — ABNORMAL LOW (ref 98–111)
Creatinine, Ser: 3.09 mg/dL — ABNORMAL HIGH (ref 0.61–1.24)
GFR calc Af Amer: 23 mL/min — ABNORMAL LOW (ref >60)
GFR calc non Af Amer: 20 mL/min — ABNORMAL LOW (ref >60)
Glucose, Bld: 430 mg/dL — ABNORMAL HIGH (ref 70–99)
Potassium: 3.2 mmol/L — ABNORMAL LOW (ref 3.5–5.1)
Sodium: 133 mmol/L — ABNORMAL LOW (ref 135–145)
Total Bilirubin: 0.9 mg/dL (ref 0.3–1.2)
Total Protein: 7.3 g/dL (ref 6.5–8.1)

## 2018-06-26 LAB — URINALYSIS, ROUTINE W REFLEX MICROSCOPIC
Bilirubin Urine: NEGATIVE
Glucose, UA: 50 mg/dL — AB
Hgb urine dipstick: NEGATIVE
Ketones, ur: NEGATIVE mg/dL
Nitrite: NEGATIVE
Protein, ur: 30 mg/dL — AB
Specific Gravity, Urine: 1.011 (ref 1.005–1.030)
pH: 5 (ref 5.0–8.0)

## 2018-06-26 LAB — GLUCOSE, CAPILLARY
Glucose-Capillary: 340 mg/dL — ABNORMAL HIGH (ref 70–99)
Glucose-Capillary: 324 mg/dL — ABNORMAL HIGH (ref 70–99)

## 2018-06-26 LAB — LIPASE, BLOOD: Lipase: 52 U/L — ABNORMAL HIGH (ref 11–51)

## 2018-06-26 LAB — LACTIC ACID, PLASMA
Lactic Acid, Venous: 5.5 mmol/L (ref 0.5–1.9)
Lactic Acid, Venous: 6.5 mmol/L (ref 0.5–1.9)
Lactic Acid, Venous: 5 mmol/L (ref 0.5–1.9)
Lactic Acid, Venous: 3.4 mmol/L (ref 0.5–1.9)

## 2018-06-26 MED ORDER — ALLOPURINOL 300 MG PO TABS
300.0000 mg | ORAL_TABLET | Freq: Every day | ORAL | Status: DC
  Administered 2018-06-27 – 2018-07-01 (×5): 300 mg via ORAL
  Filled 2018-06-26 (×5): qty 1

## 2018-06-26 MED ORDER — SODIUM CHLORIDE 0.9 % IV BOLUS (SEPSIS)
1000.0000 mL | Freq: Once | INTRAVENOUS | Status: AC
  Administered 2018-06-26: 1000 mL via INTRAVENOUS

## 2018-06-26 MED ORDER — POTASSIUM CHLORIDE CRYS ER 20 MEQ PO TBCR
40.0000 meq | EXTENDED_RELEASE_TABLET | Freq: Once | ORAL | Status: AC
  Administered 2018-06-26: 40 meq via ORAL
  Filled 2018-06-26: qty 2

## 2018-06-26 MED ORDER — SODIUM CHLORIDE 0.9 % IV SOLN
2.0000 g | Freq: Once | INTRAVENOUS | Status: AC
  Administered 2018-06-26: 14:00:00 2 g via INTRAVENOUS
  Filled 2018-06-26: qty 2

## 2018-06-26 MED ORDER — LORATADINE 10 MG PO TABS
10.0000 mg | ORAL_TABLET | Freq: Every day | ORAL | Status: DC
  Administered 2018-06-27 – 2018-07-01 (×5): 10 mg via ORAL
  Filled 2018-06-26 (×5): qty 1

## 2018-06-26 MED ORDER — ONDANSETRON HCL 4 MG/2ML IJ SOLN: 4.0000 mg | Freq: Four times a day (QID) | INTRAMUSCULAR | Status: DC | PRN

## 2018-06-26 MED ORDER — SORBITOL 70 % SOLN
960.0000 mL | TOPICAL_OIL | Freq: Once | ORAL | Status: DC
  Filled 2018-06-26: qty 473

## 2018-06-26 MED ORDER — INSULIN ASPART 100 UNIT/ML ~~LOC~~ SOLN
10.0000 [IU] | Freq: Once | SUBCUTANEOUS | Status: AC
  Administered 2018-06-26: 10 [IU] via SUBCUTANEOUS

## 2018-06-26 MED ORDER — ACETAMINOPHEN 325 MG PO TABS
650.0000 mg | ORAL_TABLET | Freq: Four times a day (QID) | ORAL | Status: DC | PRN
  Administered 2018-06-27 – 2018-06-30 (×2): 650 mg via ORAL
  Filled 2018-06-26 (×2): qty 2

## 2018-06-26 MED ORDER — VANCOMYCIN HCL IN DEXTROSE 1-5 GM/200ML-% IV SOLN: 1000.0000 mg | Freq: Once | INTRAVENOUS | Status: DC

## 2018-06-26 MED ORDER — ATORVASTATIN CALCIUM 20 MG PO TABS
20.0000 mg | ORAL_TABLET | Freq: Every day | ORAL | Status: DC
  Administered 2018-06-26 – 2018-06-30 (×5): 20 mg via ORAL
  Filled 2018-06-26 (×5): qty 1

## 2018-06-26 MED ORDER — INSULIN ASPART 100 UNIT/ML ~~LOC~~ SOLN
0.0000 [IU] | Freq: Three times a day (TID) | SUBCUTANEOUS | Status: DC
  Administered 2018-06-26: 11 [IU] via SUBCUTANEOUS
  Administered 2018-06-27: 12:00:00 5 [IU] via SUBCUTANEOUS
  Administered 2018-06-27 (×2): 3 [IU] via SUBCUTANEOUS
  Administered 2018-06-28: 5 [IU] via SUBCUTANEOUS
  Administered 2018-06-28 – 2018-06-29 (×4): 3 [IU] via SUBCUTANEOUS
  Administered 2018-06-29: 2 [IU] via SUBCUTANEOUS
  Administered 2018-06-30: 5 [IU] via SUBCUTANEOUS
  Administered 2018-06-30: 2 [IU] via SUBCUTANEOUS
  Administered 2018-07-01: 3 [IU] via SUBCUTANEOUS

## 2018-06-26 MED ORDER — VANCOMYCIN HCL 10 G IV SOLR
1500.0000 mg | INTRAVENOUS | Status: DC
  Filled 2018-06-26: qty 1500

## 2018-06-26 MED ORDER — METRONIDAZOLE IN NACL 5-0.79 MG/ML-% IV SOLN
500.0000 mg | Freq: Three times a day (TID) | INTRAVENOUS | Status: DC
  Administered 2018-06-26 – 2018-07-01 (×13): 500 mg via INTRAVENOUS
  Filled 2018-06-26 (×13): qty 100

## 2018-06-26 MED ORDER — VANCOMYCIN HCL IN DEXTROSE 1-5 GM/200ML-% IV SOLN
1000.0000 mg | INTRAVENOUS | Status: AC
  Administered 2018-06-26 (×2): 1000 mg via INTRAVENOUS
  Filled 2018-06-26 (×2): qty 200

## 2018-06-26 MED ORDER — SODIUM CHLORIDE 0.9 % IV BOLUS
1000.0000 mL | Freq: Once | INTRAVENOUS | Status: AC
  Administered 2018-06-26: 1000 mL via INTRAVENOUS

## 2018-06-26 MED ORDER — ONDANSETRON HCL 4 MG PO TABS: 4.0000 mg | ORAL_TABLET | Freq: Four times a day (QID) | ORAL | Status: DC | PRN

## 2018-06-26 MED ORDER — ALBUTEROL SULFATE HFA 108 (90 BASE) MCG/ACT IN AERS: 2.0000 | INHALATION_SPRAY | Freq: Four times a day (QID) | RESPIRATORY_TRACT | Status: DC | PRN

## 2018-06-26 MED ORDER — ACETAMINOPHEN 650 MG RE SUPP: 650.0000 mg | Freq: Four times a day (QID) | RECTAL | Status: DC | PRN

## 2018-06-26 MED ORDER — SODIUM CHLORIDE 0.9 % IV SOLN
2.0000 g | INTRAVENOUS | Status: DC
  Administered 2018-06-27 – 2018-06-30 (×4): 2 g via INTRAVENOUS
  Filled 2018-06-26 (×4): qty 2

## 2018-06-26 MED ORDER — INSULIN GLARGINE 100 UNIT/ML ~~LOC~~ SOLN
20.0000 [IU] | Freq: Two times a day (BID) | SUBCUTANEOUS | Status: DC
  Administered 2018-06-26 – 2018-07-01 (×10): 20 [IU] via SUBCUTANEOUS
  Filled 2018-06-26 (×12): qty 0.2

## 2018-06-26 MED ORDER — LINACLOTIDE 145 MCG PO CAPS
290.0000 ug | ORAL_CAPSULE | Freq: Every day | ORAL | Status: DC
  Administered 2018-06-27: 290 ug via ORAL
  Filled 2018-06-26: qty 2

## 2018-06-26 MED ORDER — METOPROLOL SUCCINATE ER 50 MG PO TB24
100.0000 mg | ORAL_TABLET | Freq: Two times a day (BID) | ORAL | Status: DC
  Administered 2018-06-27 – 2018-07-01 (×9): 100 mg via ORAL
  Filled 2018-06-26 (×9): qty 2

## 2018-06-26 MED ORDER — ENOXAPARIN SODIUM 30 MG/0.3ML ~~LOC~~ SOLN
30.0000 mg | SUBCUTANEOUS | Status: DC
  Administered 2018-06-26 – 2018-06-30 (×5): 30 mg via SUBCUTANEOUS
  Filled 2018-06-26 (×5): qty 0.3

## 2018-06-26 MED ORDER — POTASSIUM CHLORIDE IN NACL 20-0.9 MEQ/L-% IV SOLN
INTRAVENOUS | Status: AC
  Administered 2018-06-26: 17:00:00 via INTRAVENOUS

## 2018-06-26 MED ORDER — INSULIN ASPART 100 UNIT/ML ~~LOC~~ SOLN
0.0000 [IU] | Freq: Every day | SUBCUTANEOUS | Status: DC
  Administered 2018-06-26: 4 [IU] via SUBCUTANEOUS

## 2018-06-26 MED ORDER — TRAZODONE HCL 50 MG PO TABS: 25.0000 mg | ORAL_TABLET | Freq: Every evening | ORAL | Status: DC | PRN

## 2018-06-26 MED ORDER — METRONIDAZOLE IN NACL 5-0.79 MG/ML-% IV SOLN
500.0000 mg | Freq: Once | INTRAVENOUS | Status: AC
  Administered 2018-06-26: 15:00:00 500 mg via INTRAVENOUS
  Filled 2018-06-26: qty 100

## 2018-06-26 MED ORDER — FLUTICASONE PROPIONATE 50 MCG/ACT NA SUSP
2.0000 | Freq: Every day | NASAL | Status: DC
  Administered 2018-06-27 – 2018-07-01 (×5): 2 via NASAL
  Filled 2018-06-26 (×2): qty 16

## 2018-06-26 MED ORDER — PANTOPRAZOLE SODIUM 40 MG PO TBEC
40.0000 mg | DELAYED_RELEASE_TABLET | Freq: Every day | ORAL | Status: DC
  Administered 2018-06-27 – 2018-07-01 (×5): 40 mg via ORAL
  Filled 2018-06-26 (×5): qty 1

## 2018-06-26 MED ORDER — INSULIN ASPART 100 UNIT/ML ~~LOC~~ SOLN
6.0000 [IU] | Freq: Three times a day (TID) | SUBCUTANEOUS | Status: DC
  Administered 2018-06-26 – 2018-07-01 (×12): 6 [IU] via SUBCUTANEOUS

## 2018-06-26 MED ORDER — ISOSORBIDE MONONITRATE ER 60 MG PO TB24
30.0000 mg | ORAL_TABLET | Freq: Every day | ORAL | Status: DC
  Administered 2018-06-27 – 2018-07-01 (×5): 30 mg via ORAL
  Filled 2018-06-26 (×5): qty 1

## 2018-06-26 MED ORDER — SODIUM CHLORIDE 0.9 % IV BOLUS (SEPSIS)
1000.0000 mL | Freq: Once | INTRAVENOUS | Status: AC
  Administered 2018-06-26: 16:00:00 1000 mL via INTRAVENOUS

## 2018-06-26 MED ORDER — ALBUTEROL SULFATE (2.5 MG/3ML) 0.083% IN NEBU: 2.5000 mg | INHALATION_SOLUTION | Freq: Four times a day (QID) | RESPIRATORY_TRACT | Status: DC | PRN

## 2018-06-26 NOTE — ED Notes (Signed)
Contact for pt Sarita Bottom (pt's nephew) [phone]  Pharmacy can call Mr . Adah Perl for med list

## 2018-06-26 NOTE — ED Notes (Signed)
CRITICAL VALUE ALERT  Critical Value:  Lactic Acid 6.5  Date & Time Notied:  06/26/2018 @ 0979  Provider Notified: Dr Wynetta Emery  Orders Received/Actions taken: see new orders.

## 2018-06-26 NOTE — ED Notes (Signed)
Bladder scan resulted over 999, inserted foley

## 2018-06-26 NOTE — Progress Notes (Signed)
06/26/2018 5:55 PM  Update: Pt had 2 large stools without having to get the enema.  Will DC enema.  Murvin Natal MD

## 2018-06-26 NOTE — ED Provider Notes (Signed)
Emergency Department Provider Note   I have reviewed the triage vital signs and the nursing notes.   HISTORY  Chief Complaint Diarrhea   HPI Frank Hoover is a 68 y.o. male with PMH of CHF with AICD, COPD, CKD, HTN, HLD, and DM presents to the emergency department by EMS for evaluation of generalized weakness with diarrhea.  Symptoms began yesterday.  Patient notes urinary retention symptoms as well.  He states last time he urinated was yesterday evening.  He reports some subjective fevers at home.  He denies any respiratory symptoms such as cough, shortness of breath, runny nose, sore throat.  No vomiting.  Diarrhea is nonbloody. Denies any CP.   Past Medical History:  Diagnosis Date  . Adenomatous colon polyp 10/30/09   Serrated adenoma removed during Colonoscopy   . AICD (automatic cardioverter/defibrillator) present 06/11/2016  . Arthritis    "legs" (09/27/2015)  . Atrial flutter with rapid ventricular response ([place]) 09/27/2015  . CHF (congestive heart failure) ([place])   . CKD (chronic kidney disease) stage 3, GFR 30-59 ml/min (HCC)   . COPD (chronic obstructive pulmonary disease) ([place])   . Diverticulosis of colon   . Dysrhythmia   . Essential hypertension   . Gastroparesis   . GERD (gastroesophageal reflux disease)   . Gout   . Hyperlipemia   . Hypertension   . OA (osteoarthritis)   . Type 2 diabetes mellitus [facility])     Patient Active Problem List   Diagnosis Date Noted  . Chronic systolic CHF (congestive heart failure) ([place]) 06/11/2016  . Diarrhea 04/18/2016  . Anemia 10/17/2015  . Nausea and vomiting 10/17/2015  . Atrial flutter ([place]) 09/27/2015  . Pressure ulcer 09/05/2015  . Abdominal pain   . Atrial fibrillation with RVR ([place])   . HCAP (healthcare-associated pneumonia)   . Acute renal failure superimposed on stage 3 chronic kidney disease ([place])   . Metabolic encephalopathy   . Abdominal distension   . Urinary tract infection, site not specified   .  Cardiogenic shock ([place])   . Acute systolic CHF (congestive heart failure) ([place])   . Uncontrolled type 2 diabetes mellitus with complication ([place])   . Urinary retention   . Bacteremia   . Ischemic colitis ([place])   . Noninfectious gastroenteritis, unspecified   . Proctosigmoiditis   . Lower GI bleed   . SVT (supraventricular tachycardia) ([place]) 08/24/2015  . Debility   . Blood in stool   . Acute blood loss anemia   . Abnormal CT scan, colon   . Acute systolic heart failure ([place])   . Acute kidney injury ([place])   . Acute respiratory failure ([place])   . Septic shock ([place])   . Acute encephalopathy   . Atrial flutter with rapid ventricular response ([place]) 08/14/2015  . Systolic CHF ([place]) 65/99/3570  . History of colonic polyps   . Dehydration 12/05/2013  . Acute renal failure ([place]) 12/02/2013  . Sleep apnea 12/02/2013  . Hyponatremia 12/02/2013  . Acute on chronic renal failure ([place]) 12/02/2013  . COPD (chronic obstructive pulmonary disease) ([place])   . DM (diabetes mellitus) ([place])   . HTN (hypertension)   . Hyperlipemia   . CRF (chronic renal failure)   . Hepatomegaly 03/25/2013  . Dysphagia 12/06/2010  . History of adenomatous polyp of colon 09/21/2010  . Gastroparesis 09/21/2010  . Obesity 09/21/2010  . GERD 10/09/2009  . Constipation 10/09/2009  . ABDOMINAL PAIN-EPIGASTRIC 10/09/2009    Past Surgical History:  Procedure Laterality Date  .  CARDIOVERSION N/A 08/22/2015   Procedure: CARDIOVERSION;  Surgeon: Larey Dresser, MD;  Location: [facility];  Service: Cardiovascular;  Laterality: N/A;  . CARDIOVERSION N/A 09/01/2015   Procedure: CARDIOVERSION;  Surgeon: Lelon Perla, MD;  Location: [facility];  Service: Cardiovascular;  Laterality: N/A;  . CATARACT EXTRACTION W/ INTRAOCULAR LENS  IMPLANT, BILATERAL Bilateral   . COLONOSCOPY  10/30/09   QBV:[id number] diverticulum/serrated adenoma from ICV, next colonoscopy due 10/2012  . COLONOSCOPY N/A 09/18/2012   KCM:KLKJZPH  mucosa that was seen appeared normal, however most of it was not seen due to be very poor prep  . COLONOSCOPY N/A 04/08/2013   XTA:VWPVXYI polyp removed/inadequate preparation  . COLONOSCOPY N/A 06/22/2014   Procedure: COLONOSCOPY;  Surgeon: Daneil Dolin, MD;  [facility];  Service: Endoscopy;  Laterality: N/A;  115   . ELECTROPHYSIOLOGIC STUDY N/A 09/27/2015   Procedure: SVT Ablation;  Surgeon: Evans Lance, MD;  Location: [facility];  Service: Cardiovascular;  Laterality: N/A;  . EP IMPLANTABLE DEVICE  06/11/2016  . ESOPHAGOGASTRODUODENOSCOPY  10/30/09   AXK:PVVZSM   . FLEXIBLE SIGMOIDOSCOPY N/A 08/25/2015   Procedure: FLEXIBLE SIGMOIDOSCOPY;  Surgeon: Jerene Bears, MD;  Location: [facility];  Service: Endoscopy;  Laterality: N/A;  . ICD IMPLANT N/A 06/11/2016   Procedure: ICD Implant;  Surgeon: Evans Lance, MD;  Location: [facility];  Service: Cardiovascular;  Laterality: N/A;  . TEE WITHOUT CARDIOVERSION N/A 08/22/2015   Procedure: TRANSESOPHAGEAL ECHOCARDIOGRAM (TEE);  Surgeon: Larey Dresser, MD;  Location: [facility];  Service: Cardiovascular;  Laterality: N/A;  . TEE WITHOUT CARDIOVERSION  09/27/2015   Procedure: Transesophageal Echocardiogram (Tee);  Surgeon: Evans Lance, MD;  Location: [facility];  Service: Cardiovascular;;  . TONSILLECTOMY  1950s/1960s    Allergies Patient has no known allergies.  Family History  Problem Relation Age of Onset  . COPD Father   . Diabetes Mother   . Hypertension Mother   . Colon cancer Neg Hx     Social History Social History   Tobacco Use  . Smoking status: Former Smoker    Packs/day: 1.00    Years: 4.00    Pack years: 4.00    Types: Cigarettes    Last attempt to quit: 08/26/2010    Years since quitting: 7.8  . Smokeless tobacco: Never Used  Substance Use Topics  . Alcohol use: No    Alcohol/week: 0.0 standard drinks    Comment: "stopped in 2012 when I quit smoking"  . Drug use: No     Review of Systems  Constitutional: No fever/chills. Positive fatigue.  Eyes: No visual changes. ENT: No sore throat. Cardiovascular: Denies chest pain. Respiratory: Denies shortness of breath. Gastrointestinal: Positive lower abdominal pain.  No nausea, no vomiting. Positive diarrhea.  No constipation. Genitourinary: Negative for dysuria. Musculoskeletal: Negative for back pain. Skin: Negative for rash. Neurological: Negative for headaches, focal weakness or numbness.  10-point ROS otherwise negative.  ____________________________________________   PHYSICAL EXAM:  VITAL SIGNS: ED Triage Vitals  Enc Vitals Group     BP 06/26/18 1214 111/78     Pulse Rate 06/26/18 1214 91     Resp 06/26/18 1214 15     Temp 06/26/18 1214 99.4 F (37.4 C)     Temp Source 06/26/18 1214 Oral     SpO2 06/26/18 1214 94 %     Weight 06/26/18 1212 245 lb (111.1 kg)     Height 06/26/18 1212 5' 6"  (  1.676 m)     Pain Score 06/26/18 1212 10   Constitutional: Alert and oriented. Well appearing and in no acute distress. Eyes: Conjunctivae are normal.  Head: Atraumatic. Nose: No congestion/rhinnorhea. Mouth/Throat: Mucous membranes are slightly dry.  Neck: No stridor.  Cardiovascular: Normal rate, regular rhythm. Good peripheral circulation. Grossly normal heart sounds.   Respiratory: Normal respiratory effort.  No retractions. Lungs CTAB. Gastrointestinal: Soft with tenderness in the bilateral lower abdominal quadrants. No rebound or guarding. Mild distention.  Musculoskeletal: No lower extremity tenderness nor edema. No gross deformities of extremities. Neurologic:  Normal speech and language. Skin:  Skin is warm, dry and intact. No rash noted.  ____________________________________________   LABS (all labs ordered are listed, but only abnormal results are displayed)  Labs Reviewed  COMPREHENSIVE METABOLIC PANEL - Abnormal; Notable for the following components:      Result Value   Sodium  133 (*)    Potassium 3.2 (*)    Chloride 91 (*)    Glucose, Bld 430 (*)    BUN 43 (*)    Creatinine, Ser 3.09 (*)    GFR calc non Af Amer 20 (*)    GFR calc Af Amer 23 (*)    Anion gap 20 (*)    All other components within normal limits  LIPASE, BLOOD - Abnormal; Notable for the following components:   Lipase 52 (*)    All other components within normal limits  CBC WITH DIFFERENTIAL/PLATELET - Abnormal; Notable for the following components:   Neutro Abs 8.2 (*)    All other components within normal limits  TROPONIN I - Abnormal; Notable for the following components:   Troponin I 0.23 (*)    All other components within normal limits  URINALYSIS, ROUTINE W REFLEX MICROSCOPIC - Abnormal; Notable for the following components:   Glucose, UA 50 (*)    Protein, ur 30 (*)    Leukocytes,Ua SMALL (*)    Bacteria, UA RARE (*)    All other components within normal limits  LACTIC ACID, PLASMA - Abnormal; Notable for the following components:   Lactic Acid, Venous 5.5 (*)    All other components within normal limits  CULTURE, BLOOD (ROUTINE X 2)  CULTURE, BLOOD (ROUTINE X 2)  LACTIC ACID, PLASMA   ____________________________________________  EKG   EKG Interpretation  Date/Time:  Friday June 26 2018 12:17:45 EDT Ventricular Rate:  93 PR Interval:    QRS Duration: 92 QT Interval:  399 QTC Calculation: 497 R Axis:   -11 Text Interpretation:  Sinus rhythm Ventricular premature complex Prolonged PR interval Inferior infarct, old No STEMI. Similar to prior.  Confirmed by Nanda Quinton [phone]) on 06/26/2018 12:27:09 PM       ____________________________________________  RADIOLOGY  Ct Abdomen Pelvis Wo Contrast  Result Date: 06/26/2018 CLINICAL DATA:  Abdominal distention with diarrhea and weakness. EXAM: CT ABDOMEN AND PELVIS WITHOUT CONTRAST TECHNIQUE: Multidetector CT imaging of the abdomen and pelvis was performed following the standard protocol without IV contrast. COMPARISON:   CT abdomen pelvis dated August 16, 2015. FINDINGS: Lower chest: No acute abnormality. Hepatobiliary: No focal liver abnormality. Tiny gallstone. No gallbladder wall thickening or biliary dilatation. Pancreas: Atrophic. No ductal dilatation or surrounding inflammatory changes. Spleen: Normal in size without focal abnormality. Adrenals/Urinary Tract: The adrenal glands are unremarkable. Mild bilateral hydroureteronephrosis. No renal or ureteral calculi. No focal renal lesion. The bladder is decompressed by Foley catheter. Stomach/Bowel: The stomach and small bowel are unremarkable. No obstruction. Large amount of stool  throughout the colon with mild wall thickening of the sigmoid colon and rectum with surrounding inflammatory changes, similar to prior study. Normal appendix. Vascular/Lymphatic: Aortic atherosclerosis. No enlarged abdominal or pelvic lymph nodes. Reproductive: Prostate is unremarkable. Other: Unchanged small fat containing umbilical hernia. No free fluid or pneumoperitoneum. Unchanged presacral soft tissue stranding. Musculoskeletal: No acute or significant osseous findings. Unchanged small lipomas in the right adductor musculature and left rectus femoris muscle. IMPRESSION: 1. 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 2. Mild bilateral hydroureteronephrosis without evident obstruction. 3. Cholelithiasis. 4.  Aortic atherosclerosis (ICD10-I70.0). Electronically Signed   By: Titus Dubin M.D.   On: 06/26/2018 13:27    ____________________________________________   PROCEDURES  Procedure(s) performed:   Procedures  CRITICAL CARE Performed by: Margette Fast Total critical care time: 40 minutes Critical care time was exclusive of separately billable procedures and treating other patients. Critical care was  necessary to treat or prevent imminent or life-threatening deterioration. Critical care was time spent personally by me on the following activities: development of treatment plan with patient and/or surrogate as well as nursing, discussions with consultants, evaluation of patient's response to treatment, examination of patient, obtaining history from patient or surrogate, ordering and performing treatments and interventions, ordering and review of laboratory studies, ordering and review of radiographic studies, pulse oximetry and re-evaluation of patient's condition.  Nanda Quinton, MD Emergency Medicine  ____________________________________________   INITIAL IMPRESSION / ASSESSMENT AND PLAN / ED COURSE  Pertinent labs & imaging results that were available during my care of the patient were reviewed by me and considered in my medical decision making (see chart for details).   Patient arrives to the emergency department by EMS with complaint of diarrhea and generalized weakness.  Some possible subjective fever at home and complaint of the patient of not being able to urinate.  Ordered bladder scan along with screening labs.  Temperature 99.4.  No hypoxemia or increased work of breathing. Very low suspicion for COVID. EF in 2018 is 30-35% with CKD noted on recent labs. Plan for CT imaging of the abdomen given lower quadrant tenderness with with CKD will have to be w/o contrast. Mild dehydration noted but will wait on fluid with CHF and CKD history.   01:35 PM  Patient's lab work shows lactic acid of near 5 and elevated troponin.  Troponin seem in line with prior values.  Given the patient's history of CKD and congestive heart failure I have very low suspicion that this represents acute ACS.  Patient's lactic acidosis is less likely to be from sepsis.  Suspect that dehydration and kidney disease are contributing to this as well.  He does describe some subjective fever.  No clear source of infection at  this time.  Plan to gently resuscitate with IV fluids and begin antibiotics in the setting of uncertainty.  Will admit for observation.  CT imaging shows no acute findings to suggest acute infection.  Patient had significant urinary retention and Foley catheter was placed.  In 2017 the patient had an admission for sepsis which was related to a urinary source.  Given after resuscitation, the patient continued to have elevated lactate.   Updated patient's son Trequan Marsolek by phone.  He can be in contact with staff regarding any medication updates.  No new history to provide other than what was given by the patient and  EMS.   Discussed patient's case with Hospitalist, Dr. Wynetta Emery to request admission. Patient and family (if present) updated with plan. Care transferred to Hospitalist service.  I reviewed all nursing notes, vitals, pertinent old records, EKGs, labs, imaging (as available).  ____________________________________________  FINAL CLINICAL IMPRESSION(S) / ED DIAGNOSES  Final diagnoses:  Lactic acidosis  Urinary retention  Lower abdominal pain    MEDICATIONS GIVEN DURING THIS VISIT:  Medications  sodium chloride 0.9 % bolus 1,000 mL (has no administration in time range)  ceFEPIme (MAXIPIME) 2 g in sodium chloride 0.9 % 100 mL IVPB (has no administration in time range)  metroNIDAZOLE (FLAGYL) IVPB 500 mg (has no administration in time range)  vancomycin (VANCOCIN) IVPB 1000 mg/200 mL premix (has no administration in time range)    Note:  This document was prepared using Dragon voice recognition software and may include unintentional dictation errors.  Nanda Quinton, MD Emergency Medicine    Long, Wonda [facility], MD 06/26/18 1400

## 2018-06-26 NOTE — ED Notes (Signed)
Date and time results received: 06/26/18 1307 (use smartphrase ".now" to insert current time)  Test: lac acid Critical Value: 5.5  Name of Provider Notified: long  Orders Received? Or Actions Taken?: see emar

## 2018-06-26 NOTE — ED Notes (Signed)
Patient transported to CT 

## 2018-06-26 NOTE — ED Triage Notes (Addendum)
Patient brought in by EMS from home with complaint of generalized weakness and diarrhea since yesterday. Also complaining of pain to genitals and states "I can't pee."

## 2018-06-26 NOTE — Progress Notes (Signed)
Pharmacy Antibiotic Note  Frank Hoover is a 68 y.o. male admitted on 06/26/2018 with unknown source of infection.  Pharmacy has been consulted for Vancomycin and cefepime dosing.  Plan: Vancomycin 2000mg  loading dose, then 1500mg   IV every 48 hours.  Goal trough 15-20 mcg/mL.  Cefepime 2gm IV q24h F/U cxs and clinical progress Monitor V/S, labs, and levels as indicated   Height: 5\' 6"  (167.6 cm) Weight: 245 lb (111.1 kg) IBW/kg (Calculated) : 63.8  Temp (24hrs), Avg:99.4 F (37.4 C), Min:99.4 F (37.4 C), Max:99.4 F (37.4 C)  Recent Labs  Lab 06/26/18 1237  WBC 9.9  CREATININE 3.09*  LATICACIDVEN 5.5*    Estimated Creatinine Clearance: 27.5 mL/min (A) (by C-G formula based on SCr of 3.09 mg/dL (H)).   Normalized CrCl is 72mls/min  No Known Allergies  Antimicrobials this admission: Vancomycin 4/24 >>  Cefepime 4/24 >>   Dose adjustments this admission: N/A  Microbiology results: 4/24BCx: pending  MRSA PCR:  Thank you for allowing pharmacy to be a part of this patient's care.  Isac Sarna, BS Pharm D, [place] Clinical Pharmacist Pager [phone] 06/26/2018 2:06 PM

## 2018-06-26 NOTE — H&P (Addendum)
History and Physical  Frank Hoover BWL:[id number] DOB: [birth year] DOA: 06/26/2018  Referring physician: Laverta Baltimore, MD PCP: Sinda Du, MD   Chief Complaint: diarrhea, weakness  HPI: Frank Hoover is a 68 y.o. male with NICM s/p ICD placement, systolic CHF (EF 68%), stage 3 CKD, Type 2 DM, HTN, HLD and other medical problems detailed below presents to ED complaining of generalized weakness and diarrhea.  He says that he has had a difficult time urinating since yesterday evening.  He has had some low-grade fever at home.  He denies cough shortness of breath runny nose fever and sore throat.  Denies emesis.  He denies having chest pain.  He was brought to the emergency department by EMS.  He has a history of chronic constipation.  ED course: The patient had a bladder scan with greater than 999 cc of urine.  He was noted to have mild bilateral hydronephrosis on CT scan.  He had a Foley catheter placed with good urine drainage.  His CT abdomen showed a lot of chronic findings but there was concern about large amounts of stool and proctocolitis was noted.  He also was noted to have has some of the same findings in 2017 from another CT scan.  His lactic acid was markedly elevated at greater than 5.  He was given a bolus of IV fluids.  He was started on broad-spectrum antibiotic therapy.  His troponin was elevated at 0.2, however upon review he has had no change in this troponin elevation over the past several years.  The patient has no symptoms of chest pain.  His vital signs remain good.  He is not hypotensive.  [name] is 99.4.  His blood pressure is 135/82.  His pulse ox is 93% on room air. Labs: Sodium 133 potassium 3.2, blood glucose 430, creatinine 3.09, troponin 0 0.23, lactic acid 5.5, WBC 9.9, hemoglobin 14 0.0, platelets 199.  Urinalysis microscopy shows 21-50 red blood cells per hpf, WBC 11-20.  Glucose and protein present in urine.  Review of Systems: All systems reviewed and apart from  history of presenting illness, are negative.  Past Medical History:  Diagnosis Date  . Adenomatous colon polyp 10/30/09   Serrated adenoma removed during Colonoscopy   . AICD (automatic cardioverter/defibrillator) present 06/11/2016  . Arthritis    "legs" (09/27/2015)  . Atrial flutter with rapid ventricular response ([place]) 09/27/2015  . CHF (congestive heart failure) ([place])   . CKD (chronic kidney disease) stage 3, GFR 30-59 ml/min (HCC)   . COPD (chronic obstructive pulmonary disease) ([place])   . Diverticulosis of colon   . Dysrhythmia   . Essential hypertension   . Gastroparesis   . GERD (gastroesophageal reflux disease)   . Gout   . Hyperlipemia   . Hypertension   . OA (osteoarthritis)   . Type 2 diabetes mellitus ([place])    Past Surgical History:  Procedure Laterality Date  . CARDIOVERSION N/A 08/22/2015   Procedure: CARDIOVERSION;  Surgeon: Larey Dresser, MD;  Location: [place];  Service: Cardiovascular;  Laterality: N/A;  . CARDIOVERSION N/A 09/01/2015   Procedure: CARDIOVERSION;  Surgeon: Lelon Perla, MD;  Location: [place];  Service: Cardiovascular;  Laterality: N/A;  . CATARACT EXTRACTION W/ INTRAOCULAR LENS  IMPLANT, BILATERAL Bilateral   . COLONOSCOPY  10/30/09   TLX:BWIO-MBTDH diverticulum/serrated adenoma from ICV, next colonoscopy due 10/2012  . COLONOSCOPY N/A 09/18/2012   RCB:ULAGTXM mucosa that was seen appeared normal, however most of it was not seen  due to be very poor prep  . COLONOSCOPY N/A 04/08/2013   PJA:SNKNLZJ polyp removed/inadequate preparation  . COLONOSCOPY N/A 06/22/2014   Procedure: COLONOSCOPY;  Surgeon: Daneil Dolin, MD;  [facility];  Service: Endoscopy;  Laterality: N/A;  115   . ELECTROPHYSIOLOGIC STUDY N/A 09/27/2015   Procedure: SVT Ablation;  Surgeon: Evans Lance, MD;  Location: [place] [facility];  Service: Cardiovascular;  Laterality: N/A;  . EP IMPLANTABLE DEVICE  06/11/2016  . ESOPHAGOGASTRODUODENOSCOPY  10/30/09    QBH:ALPFXT   . FLEXIBLE SIGMOIDOSCOPY N/A 08/25/2015   Procedure: FLEXIBLE SIGMOIDOSCOPY;  Surgeon: Jerene Bears, MD;  Location: [facility];  Service: Endoscopy;  Laterality: N/A;  . ICD IMPLANT N/A 06/11/2016   Procedure: ICD Implant;  Surgeon: Evans Lance, MD;  Location: [place] [facility];  Service: Cardiovascular;  Laterality: N/A;  . TEE WITHOUT CARDIOVERSION N/A 08/22/2015   Procedure: TRANSESOPHAGEAL ECHOCARDIOGRAM (TEE);  Surgeon: Larey Dresser, MD;  Location: [place];  Service: Cardiovascular;  Laterality: N/A;  . TEE WITHOUT CARDIOVERSION  09/27/2015   Procedure: Transesophageal Echocardiogram (Tee);  Surgeon: Evans Lance, MD;  Location: [place] [facility];  Service: Cardiovascular;;  . TONSILLECTOMY  1950s/1960s   Social History:  reports that he quit smoking about 7 years ago. His smoking use included cigarettes. He has a 4.00 pack-year smoking history. He has never used smokeless tobacco. He reports that he does not drink alcohol or use drugs.  No Known Allergies  Family History  Problem Relation Age of Onset  . COPD Father   . Diabetes Mother   . Hypertension Mother   . Colon cancer Neg Hx     Prior to Admission medications   Medication Sig Start Date End Date Taking? Authorizing Provider  acetaminophen (TYLENOL) 500 MG tablet Take 1,000 mg by mouth daily as needed for moderate pain or headache.    Yes [provider]  albuterol (PROAIR HFA) 108 (90 BASE) MCG/ACT inhaler Inhale 2 puffs into the lungs every 6 (six) hours as needed for wheezing or shortness of breath.    Yes [provider]  allopurinol (ZYLOPRIM) 300 MG tablet Take 300 mg by mouth daily.     Yes [provider]  alum & mag hydroxide-simeth (MAALOX/MYLANTA) 200-200-20 MG/5ML suspension Take 15 mLs by mouth every 6 (six) hours as needed for indigestion or heartburn.   Yes [provider]  atorvastatin (LIPITOR) 20 MG tablet Take 20 mg by mouth at bedtime.   Yes  [provider]  cholecalciferol (VITAMIN D) 1000 units tablet Take 1,000 Units by mouth daily.   Yes [provider]  fluticasone (FLONASE) 50 MCG/ACT nasal spray Place 2 sprays into both nostrils daily.  05/29/18  Yes [provider]  furosemide (LASIX) 40 MG tablet Take 1 tablet (40 mg total) by mouth every morning. & 20 mg in the evening Patient taking differently: Take 40 mg by mouth 2 (two) times daily.  06/12/18 09/10/18 Yes BranchAlphonse Guild, MD  gabapentin (NEURONTIN) 400 MG capsule Take 400 mg by mouth daily.    Yes [provider]  hydrALAZINE (APRESOLINE) 25 MG tablet Take 75 mg by mouth 3 (three) times daily.   Yes [provider]  insulin lispro (HUMALOG KWIKPEN) 100 UNIT/ML KiwkPen Inject 18-20 Units into the skin as directed. Inject 2 to 18 units based on sliding scale as directed as needed for blood glucose over 200   Yes [provider]  isosorbide  mononitrate (IMDUR) 30 MG 24 hr tablet Take 1 tablet (30 mg total) by mouth daily. 05/04/18  Yes Evans Lance, MD  LANTUS SOLOSTAR 100 UNIT/ML Solostar Pen Inject 20 Units into the skin 2 (two) times daily.  06/23/18  Yes [provider]  LINZESS 290 MCG CAPS capsule TAKE 1 CAPSULE ONCE DAILY [place] BREAKFAST Patient taking differently: Take 290 mcg by mouth daily before breakfast.  12/16/17  Yes Annitta Needs, NP  loperamide (IMODIUM) 2 MG capsule Take 2 mg by mouth daily as needed for constipation. 07/24/15  Yes [provider]  loratadine (CLARITIN) 10 MG tablet Take 10 mg by mouth daily.   Yes [provider]  metoprolol succinate (TOPROL-XL) 100 MG 24 hr tablet Take 1 tablet (100 mg total) by mouth 2 (two) times daily. Take with or immediately following a meal. 10/18/15  Yes Branch, Alphonse Guild, MD  Multiple Vitamin (MULTIVITAMIN WITH MINERALS) TABS tablet Take 1 tablet by mouth daily. 09/07/15  Yes Hosie Poisson, MD  ondansetron (ZOFRAN) 4 MG tablet  Take 1 tablet (4 mg total) by mouth every 8 (eight) hours as needed for nausea or vomiting. 10/17/15  Yes Carlis Stable, NP  pantoprazole (PROTONIX) 40 MG tablet Take 1 tablet (40 mg total) by mouth daily. 09/07/15  Yes Hosie Poisson, MD  potassium chloride SA (KLOR-CON M20) 20 MEQ tablet Take 1 tablet (20 mEq total) by mouth daily. 05/06/18 08/04/18 Yes BranchAlphonse Guild, MD  vitamin C (ASCORBIC ACID) 500 MG tablet Take 500 mg by mouth 2 (two) times daily.   Yes [provider]  HYDROcodone-acetaminophen (NORCO/VICODIN) 5-325 MG tablet Take 1 tablet by mouth every 6 (six) hours as needed for severe pain. Patient not taking: Reported on 06/26/2018 09/08/15   Hosie Poisson, MD   Physical Exam: Vitals:   06/26/18 1230 06/26/18 1252 06/26/18 1300 06/26/18 1400  BP: 134/85  125/74 135/82  Pulse: 91 88 86 83  Resp: 16 16 17  (!) 21  Temp:      TempSrc:      SpO2: 93% 97% 98% 93%  Weight:      Height:        General exam: Moderately built and nourished patient, lying comfortably supine on the gurney in no obvious distress.  Head, eyes and ENT: Nontraumatic and normocephalic. Pupils equally reacting to light and accommodation. Oral mucosa dry.  Neck: Supple. No JVD, carotid bruit or thyromegaly.  Lymphatics: No lymphadenopathy.  Respiratory system: Clear to auscultation. No increased work of breathing.  Cardiovascular system: normal S1 and S2 heard, ICD in place. No pedal edema.  Gastrointestinal system: Abdomen is mildly distended, soft and nontender. Normal bowel sounds heard. No organomegaly or masses appreciated.  Central nervous system: Alert and oriented. No focal neurological deficits.  Extremities: Symmetric 5 x 5 power. Peripheral pulses symmetrically felt.   Skin: No rashes or acute findings.  Musculoskeletal system: Negative exam.  Psychiatry: Pleasant and cooperative.  Labs on Admission:  Basic Metabolic Panel: Recent Labs  Lab 06/26/18 1237  NA 133*  K 3.2*  CL  91*  CO2 22  GLUCOSE 430*  BUN 43*  CREATININE 3.09*  CALCIUM 9.0   Liver Function Tests: Recent Labs  Lab 06/26/18 1237  AST 30  ALT <5  ALKPHOS 53  BILITOT 0.9  PROT 7.3  ALBUMIN 4.3   Recent Labs  Lab 06/26/18 1237  LIPASE 52*   No results for input(s): AMMONIA in the last 168 hours. CBC:  Recent Labs  Lab 06/26/18 1237  WBC 9.9  NEUTROABS 8.2*  HGB 14.0  HCT 40.8  MCV 91.7  PLT 199   Cardiac Enzymes: Recent Labs  Lab 06/26/18 1237  TROPONINI 0.23*    BNP (last 3 results) No results for input(s): PROBNP in the last 8760 hours. CBG: No results for input(s): GLUCAP in the last 168 hours.  Radiological Exams on Admission: Ct Abdomen Pelvis Wo Contrast  Result Date: 06/26/2018 CLINICAL DATA:  Abdominal distention with diarrhea and weakness. EXAM: CT ABDOMEN AND PELVIS WITHOUT CONTRAST TECHNIQUE: Multidetector CT imaging of the abdomen and pelvis was performed following the standard protocol without IV contrast. COMPARISON:  CT abdomen pelvis dated August 16, 2015. FINDINGS: Lower chest: No acute abnormality. Hepatobiliary: No focal liver abnormality. Tiny gallstone. No gallbladder wall thickening or biliary dilatation. Pancreas: Atrophic. No ductal dilatation or surrounding inflammatory changes. Spleen: Normal in size without focal abnormality. Adrenals/Urinary Tract: The adrenal glands are unremarkable. Mild bilateral hydroureteronephrosis. No renal or ureteral calculi. No focal renal lesion. The bladder is decompressed by Foley catheter. Stomach/Bowel: The stomach and small bowel are unremarkable. No obstruction. Large amount of stool throughout the colon with mild wall thickening of the sigmoid colon and rectum with surrounding inflammatory changes, similar to prior study. Normal appendix. Vascular/Lymphatic: Aortic atherosclerosis. No enlarged abdominal or pelvic lymph nodes. Reproductive: Prostate is unremarkable. Other: Unchanged small fat containing umbilical  hernia. No free fluid or pneumoperitoneum. Unchanged presacral soft tissue stranding. Musculoskeletal: No acute or significant osseous findings. Unchanged small lipomas in the right adductor musculature and left rectus femoris muscle. IMPRESSION: 1. 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 2. Mild bilateral hydroureteronephrosis without evident obstruction. 3. Cholelithiasis. 4.  Aortic atherosclerosis (ICD10-I70.0). Electronically Signed   By: Titus Dubin M.D.   On: 06/26/2018 13:27   EKG: Independently reviewed. NSR.   Assessment/Plan Principal Problem:   Severe sepsis (HCC) Active Problems:   GERD (gastroesophageal reflux disease)   Constipation   Sleep apnea   Systolic CHF (HCC)   Debility   Uncontrolled type 2 diabetes mellitus with complication (HCC)   Diarrhea   Obstipation   Acute urinary retention   Mild bilateral Hydronephrosis   CKD (chronic kidney disease) stage 3, GFR 30-59 ml/min (HCC)   NICM (nonischemic cardiomyopathy) ([place])   ICD (implantable cardioverter-defibrillator) in place   Hypokalemia   Hyperglycemia   Proctocolitis   UTI (urinary tract infection)  1. Severe Sepsis -it appears that the patient likely has a proctocolitis most likely secondary to severe constipation exacerbated by acute urinary retention.  He has been given some relief with placement of the Foley catheter.  He is on broad-spectrum antibiotics now.  Sepsis treatment protocol ordered as his lactic acid is trending higher.  Follow-up blood and urine cultures.  He is being admitted to the stepdown unit for closer monitoring overnight.  Follow and trend lactic acid levels.  He has been bolused with IV fluids. 2. Obstipation and fecal impaction- he is severely constipated per CT scan and I have ordered an enema.   Continue laxative therapy. 3. Dehydration- he is being hydrated with IV fluids.  Monitoring closely as he does have cardiomyopathy. 4. OSA-we will order for CPAP while in the hospital. 5. Hyperglycemia- resume home Lantus and provide prandial NovoLog and sliding scale coverage.  Monitor CBG 5 times per day. 6. Acute urinary  retention-he has had this in the past we have placed a Foley catheter and his urine is flowing well.  He likely will need to keep this in the stepdown unit and hopefully can do a voiding trial in the next 1 to 2 days.  He will need urology follow-up. 7. Mild bilateral hydronephrosis-likely secondary to urinary retention.  Plan to get a renal ultrasound tomorrow after having had the Foley placed and bladder trained. 8. Nonischemic cardiomyopathy-EF most recently tested at 35%.  He is being gently hydrated and we will monitor him closely for signs of volume overload.  He is followed closely by his heart care team.  We have resumed his cardiac medications as appropriate.  We will follow him closely.  He will be in the stepdown unit. 9. UTI-this is likely secondary to urinary retention.  We have sent his urine for culture.  He is on antibiotic therapy.  Follow culture results. 10. Hypokalemia-this is being replaced.  Recheck in a.m.  Check magnesium level. 11. Proctocolitis- likely secondary to severe constipation and it appears by CT scan that he has had this for long time.  Treating with antibiotics. 12. History of ischemic colitis - He is not having any rectal bleeding at this time but with his rising lactate, abnormal CT and obstipation will involve GI team.  He denies abdominal pain symptoms.   13. Diarrhea- this is likely secondary to fecal impaction.  We are treating his fecal impaction with an enema has been ordered.  I ordered for manual disimpaction as well. 14. Stage III CKD-his creatinine appears to be at baseline we will continue to monitor. 15. Uncontrolled diabetes  mellitus type 2 with vascular complications- F3L pending, sliding scale coverage, basal and prandial insulin ordered.  Titrate as needed for better blood sugar control. 16. Generalized weakness and debility- we will plan to obtain a PT evaluation when he is more medically stable.  DVT Prophylaxis: lovenox  Code Status: Full   Family Communication: spoke with wife by telephone to update   Disposition Plan: stepdown ICU inpatient    Critical Care Time spent: 17 minutes   Shamila Lerch Wynetta Emery, MD [facility] How to contact the [facility] Attending or Consulting provider [place] or covering provider during after hours [place], for this patient?  1. Check the care team in [facility] and look for a) attending/consulting [facility] provider listed and b) the [facility] team listed 2. Log into [email or web address] and use [place]'s universal password to access. If you do not have the password, please contact the hospital operator. 3. Locate the [facility] provider you are looking for under [facility] and page to a number that you can be directly reached. 4. If you still have difficulty reaching the provider, please page the [facility] (Director on Call) for the Hospitalists listed on amion for assistance.

## 2018-06-26 NOTE — ED Notes (Signed)
CRITICAL VALUE ALERT  Critical Value:  Trop 0.23  Date & Time Notied:  06/26/18 1323  Provider Notified: Dr. Laverta Baltimore  Orders Received/Actions taken: na

## 2018-06-26 NOTE — ED Notes (Signed)
Frank Hoover) Please Call with Any Updates [phone]

## 2018-06-27 ENCOUNTER — Inpatient Hospital Stay (HOSPITAL_COMMUNITY): Payer: Medicare Other

## 2018-06-27 LAB — GLUCOSE, CAPILLARY
Glucose-Capillary: 173 mg/dL — ABNORMAL HIGH (ref 70–99)
Glucose-Capillary: 174 mg/dL — ABNORMAL HIGH (ref 70–99)
Glucose-Capillary: 202 mg/dL — ABNORMAL HIGH (ref 70–99)
Glucose-Capillary: 154 mg/dL — ABNORMAL HIGH (ref 70–99)
Glucose-Capillary: 150 mg/dL — ABNORMAL HIGH (ref 70–99)

## 2018-06-27 LAB — MAGNESIUM: Magnesium: 2.1 mg/dL (ref 1.7–2.4)

## 2018-06-27 LAB — CBC WITH DIFFERENTIAL/PLATELET
Abs Immature Granulocytes: 0.02 10*3/uL (ref 0.00–0.07)
Basophils Absolute: 0 10*3/uL (ref 0.0–0.1)
Basophils Relative: 0 %
Eosinophils Absolute: 0 10*3/uL (ref 0.0–0.5)
Eosinophils Relative: 0 %
HCT: 36 % — ABNORMAL LOW (ref 39.0–52.0)
Hemoglobin: 12 g/dL — ABNORMAL LOW (ref 13.0–17.0)
Immature Granulocytes: 0 %
Lymphocytes Relative: 21 %
Lymphs Abs: 1.4 10*3/uL (ref 0.7–4.0)
MCH: 31.1 pg (ref 26.0–34.0)
MCHC: 33.3 g/dL (ref 30.0–36.0)
MCV: 93.3 fL (ref 80.0–100.0)
Monocytes Absolute: 0.9 10*3/uL (ref 0.1–1.0)
Monocytes Relative: 14 %
Neutro Abs: 4.2 10*3/uL (ref 1.7–7.7)
Neutrophils Relative %: 65 %
Platelets: 171 10*3/uL (ref 150–400)
RBC: 3.86 MIL/uL — ABNORMAL LOW (ref 4.22–5.81)
RDW: 15.5 % (ref 11.5–15.5)
WBC: 6.6 10*3/uL (ref 4.0–10.5)
nRBC: 0 % (ref 0.0–0.2)

## 2018-06-27 LAB — COMPREHENSIVE METABOLIC PANEL
ALT: 17 U/L (ref 0–44)
AST: 32 U/L (ref 15–41)
Albumin: 3.6 g/dL (ref 3.5–5.0)
Alkaline Phosphatase: 40 U/L (ref 38–126)
Anion gap: 10 (ref 5–15)
BUN: 40 mg/dL — ABNORMAL HIGH (ref 8–23)
CO2: 25 mmol/L (ref 22–32)
Calcium: 8.6 mg/dL — ABNORMAL LOW (ref 8.9–10.3)
Chloride: 102 mmol/L (ref 98–111)
Creatinine, Ser: 2.59 mg/dL — ABNORMAL HIGH (ref 0.61–1.24)
GFR calc Af Amer: 29 mL/min — ABNORMAL LOW (ref >60)
GFR calc non Af Amer: 25 mL/min — ABNORMAL LOW (ref >60)
Glucose, Bld: 177 mg/dL — ABNORMAL HIGH (ref 70–99)
Potassium: 3.3 mmol/L — ABNORMAL LOW (ref 3.5–5.1)
Sodium: 137 mmol/L (ref 135–145)
Total Bilirubin: 0.9 mg/dL (ref 0.3–1.2)
Total Protein: 6.1 g/dL — ABNORMAL LOW (ref 6.5–8.1)

## 2018-06-27 LAB — C DIFFICILE QUICK SCREEN W PCR REFLEX
C Diff antigen: NEGATIVE
C Diff interpretation: NOT DETECTED
C Diff toxin: NEGATIVE

## 2018-06-27 LAB — MRSA PCR SCREENING: MRSA by PCR: NEGATIVE

## 2018-06-27 LAB — LACTIC ACID, PLASMA: Lactic Acid, Venous: 2.5 mmol/L (ref 0.5–1.9)

## 2018-06-27 LAB — HIV ANTIBODY (ROUTINE TESTING W REFLEX): HIV Screen 4th Generation wRfx: NONREACTIVE

## 2018-06-27 MED ORDER — POTASSIUM CHLORIDE IN NACL 20-0.9 MEQ/L-% IV SOLN
INTRAVENOUS | Status: AC
  Administered 2018-06-27: 10:00:00 via INTRAVENOUS

## 2018-06-27 MED ORDER — LINACLOTIDE 145 MCG PO CAPS
290.0000 ug | ORAL_CAPSULE | Freq: Every day | ORAL | Status: DC
  Administered 2018-06-28: 290 ug via ORAL
  Filled 2018-06-27 (×2): qty 2

## 2018-06-27 MED ORDER — SENNOSIDES-DOCUSATE SODIUM 8.6-50 MG PO TABS
1.0000 | ORAL_TABLET | Freq: Two times a day (BID) | ORAL | Status: DC
  Administered 2018-06-27 – 2018-06-28 (×2): 1 via ORAL
  Filled 2018-06-27 (×2): qty 1

## 2018-06-27 NOTE — Progress Notes (Signed)
Subjective: He was admitted yesterday with severe sepsis.  He still has elevated lactate but it is going down.  He says he feels better.  I had a telephone visit with him last week and he was complaining of having some trouble with burning when he urinates and he was sent in a prescription for an antibiotic but I am not sure if he actually filled that.  He had what appeared to be pretty severe proctitis with large amounts of stool and had urinary retention and now has Foley catheter in place.  He says he is coughing a little bit.  He does not feel well but he does feel better.  He still feels very weak.  No chest pain.  Objective: Vital signs in last 24 hours: Temp:  [98.7 F (37.1 C)-100.2 F (37.9 C)] 100.2 F (37.9 C) (04/25 0735) Pulse Rate:  [56-100] 100 (04/25 0800) Resp:  [11-21] 13 (04/25 0800) BP: (84-155)/(40-126) 127/76 (04/25 0800) SpO2:  [93 %-100 %] 99 % (04/25 0815) Weight:  [111.1 kg-114.1 kg] 114.1 kg (04/25 0400) Weight change:  Last BM Date: 06/27/18  Intake/Output from previous day: 04/24 0701 - 04/25 0700 In: 4018.1 [P.O.:240; I.V.:978; IV Piggyback:2800] Out: 2575 [Urine:2575]  PHYSICAL EXAM General appearance: alert, cooperative and no distress Resp: rhonchi bilaterally Cardio: regular rate and rhythm, S1, S2 normal, no murmur, click, rub or gallop GI: His abdomen is still tight.  Bowel sounds are present Extremities: Trace edema  Lab Results:  Results for orders placed or performed during the hospital encounter of 06/26/18 (from the past 48 hour(s))  Urinalysis, Routine w reflex microscopic     Status: Abnormal   Collection Time: 06/26/18 12:19 PM  Result Value Ref Range   Color, Urine YELLOW YELLOW   APPearance CLEAR CLEAR   Specific Gravity, Urine 1.011 1.005 - 1.030   pH 5.0 5.0 - 8.0   Glucose, UA 50 (A) NEGATIVE mg/dL   Hgb urine dipstick NEGATIVE NEGATIVE   Bilirubin Urine NEGATIVE NEGATIVE   Ketones, ur NEGATIVE NEGATIVE mg/dL   Protein, ur 30  (A) NEGATIVE mg/dL   Nitrite NEGATIVE NEGATIVE   Leukocytes,Ua SMALL (A) NEGATIVE   RBC / HPF 21-50 0 - 5 RBC/hpf   WBC, UA 11-20 0 - 5 WBC/hpf   Bacteria, UA RARE (A) NONE SEEN   Hyaline Casts, UA PRESENT     Comment: Performed at [facility], [address] [place], [place] [address]  Comprehensive metabolic panel     Status: Abnormal   Collection Time: 06/26/18 12:37 PM  Result Value Ref Range   Sodium 133 (L) 135 - 145 mmol/L   Potassium 3.2 (L) 3.5 - 5.1 mmol/L   Chloride 91 (L) 98 - 111 mmol/L   CO2 22 22 - 32 mmol/L   Glucose, Bld 430 (H) 70 - 99 mg/dL   BUN 43 (H) 8 - 23 mg/dL   Creatinine, Ser 3.09 (H) 0.61 - 1.24 mg/dL   Calcium 9.0 8.9 - 10.3 mg/dL   Total Protein 7.3 6.5 - 8.1 g/dL   Albumin 4.3 3.5 - 5.0 g/dL   AST 30 15 - 41 U/L   ALT <5 0 - 44 U/L   Alkaline Phosphatase 53 38 - 126 U/L   Total Bilirubin 0.9 0.3 - 1.2 mg/dL   GFR calc non Af Amer 20 (L) >60 mL/min   GFR calc Af Amer 23 (L) >60 mL/min   Anion gap 20 (H) 5 - 15    Comment: Performed at  [facility] [place] [place], [place] [address]  Lipase, blood     Status: Abnormal   Collection Time: 06/26/18 12:37 PM  Result Value Ref Range   Lipase 52 (H) 11 - 51 U/L    Comment: Performed at [facility], [address] [place], [place] [address]  CBC with Differential     Status: Abnormal   Collection Time: 06/26/18 12:37 PM  Result Value Ref Range   WBC 9.9 4.0 - 10.5 K/uL   RBC 4.45 4.22 - 5.81 MIL/uL   Hemoglobin 14.0 13.0 - 17.0 g/dL   HCT 40.8 39.0 - 52.0 %   MCV 91.7 80.0 - 100.0 fL   MCH 31.5 26.0 - 34.0 pg   MCHC 34.3 30.0 - 36.0 g/dL   RDW 15.4 11.5 - 15.5 %   Platelets 199 150 - 400 K/uL   nRBC 0.0 0.0 - 0.2 %   Neutrophils Relative % 83 %   Neutro Abs 8.2 (H) 1.7 - 7.7 K/uL   Lymphocytes Relative 8 %   Lymphs Abs 0.8 0.7 - 4.0 K/uL   Monocytes Relative 7 %   Monocytes Absolute 0.7 0.1 - 1.0 K/uL   Eosinophils Relative 2 %   Eosinophils Absolute 0.2 0.0 - 0.5 K/uL   Basophils  Relative 0 %   Basophils Absolute 0.0 0.0 - 0.1 K/uL   Immature Granulocytes 0 %   Abs Immature Granulocytes 0.02 0.00 - 0.07 K/uL   Reactive, Benign Lymphocytes PRESENT     Comment: Performed at [facility], [address], [place], [place] [address]  Troponin I - Once     Status: Abnormal   Collection Time: 06/26/18 12:37 PM  Result Value Ref Range   Troponin I 0.23 (HH) <0.03 ng/mL    Comment: CRITICAL RESULT CALLED TO, READ BACK BY AND VERIFIED WITH:  DANIEL, B @ 1323 ON [id number] BY HENDERSON L. Performed at [facility], [address] [place], [place] [address]   Lactic acid, plasma     Status: Abnormal   Collection Time: 06/26/18 12:37 PM  Result Value Ref Range   Lactic Acid, Venous 5.5 (HH) 0.5 - 1.9 mmol/L    Comment: CRITICAL RESULT CALLED TO, READ BACK BY AND VERIFIED WITH: WINNINGHAM C. @ 1307 ON [id number] BY HENDERSON L. Performed at [facility], [address] [place], [place] [address]   HIV antibody (Routine Testing)     Status: None   Collection Time: 06/26/18 12:37 PM  Result Value Ref Range   HIV Screen 4th Generation wRfx Non Reactive Non Reactive    Comment: (NOTE) Performed At: [facility] [place], [place] [id number] Rush Farmer MD XI:[id number]   Blood Culture (routine x 2)     Status: None (Preliminary result)   Collection Time: 06/26/18  2:29 PM  Result Value Ref Range   Specimen Description      BLOOD RIGHT ARM BOTTLES DRAWN AEROBIC AND ANAEROBIC   Special Requests Blood Culture adequate volume    Culture      NO GROWTH < 24 HOURS Performed at [facility], [address], [place], [place] [address]    Report Status PENDING   Lactic acid, plasma     Status: Abnormal   Collection Time: 06/26/18  2:35 PM  Result Value Ref Range   Lactic Acid, Venous 6.5 (HH) 0.5 - 1.9 mmol/L    Comment: CRITICAL RESULT CALLED TO, READ BACK BY AND VERIFIED WITH: MARTIN D. @ 1533 ON [id number]  BY HENDERSON L Performed at [facility], [address] [place], [place] [address]   Blood Culture (routine x 2)     Status: None (Preliminary result)   Collection Time: 06/26/18  2:36 PM  Result Value Ref Range   Specimen Description      BLOOD RIGHT ARM BOTTLES DRAWN AEROBIC AND ANAEROBIC   Special Requests Blood Culture adequate volume    Culture      NO GROWTH < 24 HOURS Performed at [facility], [address], [place], [place] [address]    Report Status PENDING   MRSA PCR Screening     Status: None   Collection Time: 06/26/18  5:02 PM  Result Value Ref Range   MRSA by PCR NEGATIVE NEGATIVE    Comment:        The GeneXpert MRSA Assay (FDA approved for NASAL specimens only), is one component of a comprehensive MRSA colonization surveillance program. It is not intended to diagnose MRSA infection nor to guide or monitor treatment for MRSA infections. Performed at [facility], [address], [place], [place] [address]   Glucose, capillary     Status: Abnormal   Collection Time: 06/26/18  5:31 PM  Result Value Ref Range   Glucose-Capillary 340 (H) 70 - 99 mg/dL  C difficile quick scan w PCR reflex     Status: None   Collection Time: 06/26/18  6:44 PM  Result Value Ref Range   C Diff antigen NEGATIVE NEGATIVE   C Diff toxin NEGATIVE NEGATIVE   C Diff interpretation No C. difficile detected.     Comment: Performed at [facility], [address] [place], [place] [address]  Lactic acid, plasma     Status: Abnormal   Collection Time: 06/26/18  7:56 PM  Result Value Ref Range   Lactic Acid, Venous 5.0 (HH) 0.5 - 1.9 mmol/L    Comment: CRITICAL RESULT CALLED TO, READ BACK BY AND VERIFIED WITH: DANIELS,J. AT 2030 ON 06/26/2018 BY EVA Performed at [facility], [address], [place], [place] [address]   Glucose, capillary     Status: Abnormal   Collection Time: 06/26/18  9:12 PM  Result Value Ref Range   Glucose-Capillary 324 (H) 70 - 99 mg/dL   Comment 1 Notify RN    Comment 2 Document in Chart   Lactic  acid, plasma     Status: Abnormal   Collection Time: 06/26/18 10:57 PM  Result Value Ref Range   Lactic Acid, Venous 3.4 (HH) 0.5 - 1.9 mmol/L    Comment: CRITICAL RESULT CALLED TO, READ BACK BY AND VERIFIED WITH: DANIELS,J @ 2321 ON 06/26/18 BY JUW Performed at [facility], [address] [place], [place] [address]   Glucose, capillary     Status: Abnormal   Collection Time: 06/27/18  2:54 AM  Result Value Ref Range   Glucose-Capillary 173 (H) 70 - 99 mg/dL   Comment 1 Notify RN    Comment 2 Document in Chart   Lactic acid, plasma     Status: Abnormal   Collection Time: 06/27/18  4:32 AM  Result Value Ref Range   Lactic Acid, Venous 2.5 (HH) 0.5 - 1.9 mmol/L    Comment: CRITICAL RESULT CALLED TO, READ BACK BY AND VERIFIED WITH: DANIELS AT 0542 ON 06/27/2018 BY MOSLEYJ Performed at [facility], [address] [place], [place] [address]   Comprehensive metabolic panel     Status: Abnormal   Collection Time: 06/27/18  4:32 AM  Result  Value Ref Range   Sodium 137 135 - 145 mmol/L   Potassium 3.3 (L) 3.5 - 5.1 mmol/L   Chloride 102 98 - 111 mmol/L   CO2 25 22 - 32 mmol/L   Glucose, Bld 177 (H) 70 - 99 mg/dL   BUN 40 (H) 8 - 23 mg/dL   Creatinine, Ser 2.59 (H) 0.61 - 1.24 mg/dL   Calcium 8.6 (L) 8.9 - 10.3 mg/dL   Total Protein 6.1 (L) 6.5 - 8.1 g/dL   Albumin 3.6 3.5 - 5.0 g/dL   AST 32 15 - 41 U/L   ALT 17 0 - 44 U/L   Alkaline Phosphatase 40 38 - 126 U/L   Total Bilirubin 0.9 0.3 - 1.2 mg/dL   GFR calc non Af Amer 25 (L) >60 mL/min   GFR calc Af Amer 29 (L) >60 mL/min   Anion gap 10 5 - 15    Comment: Performed at [facility], [address], [place], [place] [address]  Magnesium     Status: None   Collection Time: 06/27/18  4:32 AM  Result Value Ref Range   Magnesium 2.1 1.7 - 2.4 mg/dL    Comment: Performed at [facility], [address], [place], [place] [address]  CBC WITH DIFFERENTIAL     Status: Abnormal   Collection Time: 06/27/18  4:32 AM  Result Value Ref  Range   WBC 6.6 4.0 - 10.5 K/uL   RBC 3.86 (L) 4.22 - 5.81 MIL/uL   Hemoglobin 12.0 (L) 13.0 - 17.0 g/dL   HCT 36.0 (L) 39.0 - 52.0 %   MCV 93.3 80.0 - 100.0 fL   MCH 31.1 26.0 - 34.0 pg   MCHC 33.3 30.0 - 36.0 g/dL   RDW 15.5 11.5 - 15.5 %   Platelets 171 150 - 400 K/uL   nRBC 0.0 0.0 - 0.2 %   Neutrophils Relative % 65 %   Neutro Abs 4.2 1.7 - 7.7 K/uL   Lymphocytes Relative 21 %   Lymphs Abs 1.4 0.7 - 4.0 K/uL   Monocytes Relative 14 %   Monocytes Absolute 0.9 0.1 - 1.0 K/uL   Eosinophils Relative 0 %   Eosinophils Absolute 0.0 0.0 - 0.5 K/uL   Basophils Relative 0 %   Basophils Absolute 0.0 0.0 - 0.1 K/uL   Immature Granulocytes 0 %   Abs Immature Granulocytes 0.02 0.00 - 0.07 K/uL    Comment: Performed at [facility], [address], [place], [place] [address]  Glucose, capillary     Status: Abnormal   Collection Time: 06/27/18  7:28 AM  Result Value Ref Range   Glucose-Capillary 174 (H) 70 - 99 mg/dL    ABGS No results for input(s): PHART, PO2ART, TCO2, HCO3 in the last 72 hours.  Invalid input(s): PCO2 CULTURES Recent Results (from the past 240 hour(s))  Blood Culture (routine x 2)     Status: None (Preliminary result)   Collection Time: 06/26/18  2:29 PM  Result Value Ref Range Status   Specimen Description   Final    BLOOD RIGHT ARM BOTTLES DRAWN AEROBIC AND ANAEROBIC   Special Requests Blood Culture adequate volume  Final   Culture   Final    NO GROWTH < 24 HOURS Performed at [facility], [address] [place], [place] [address]    Report Status PENDING  Incomplete  Blood Culture (routine x 2)     Status: None (Preliminary result)   Collection Time: 06/26/18  2:36 PM  Result Value Ref Range Status   Specimen Description   Final    BLOOD RIGHT ARM BOTTLES DRAWN AEROBIC AND ANAEROBIC   Special Requests Blood Culture adequate volume  Final   Culture   Final    NO GROWTH < 24 HOURS Performed at [facility], [address] [place], [place] [address]     Report Status PENDING  Incomplete  MRSA PCR Screening     Status: None   Collection Time: 06/26/18  5:02 PM  Result Value Ref Range Status   MRSA by PCR NEGATIVE NEGATIVE Final    Comment:        The GeneXpert MRSA Assay (FDA approved for NASAL specimens only), is one component of a comprehensive MRSA colonization surveillance program. It is not intended to diagnose MRSA infection nor to guide or monitor treatment for MRSA infections. Performed at [facility], [address], [place], [place] [address]   C difficile quick scan w PCR reflex     Status: None   Collection Time: 06/26/18  6:44 PM  Result Value Ref Range Status   C Diff antigen NEGATIVE NEGATIVE Final   C Diff toxin NEGATIVE NEGATIVE Final   C Diff interpretation No C. difficile detected.  Final    Comment: Performed at [facility], [address] [place], [place] [address]   Studies/Results: Ct Abdomen Pelvis Wo Contrast  Result Date: 06/26/2018 CLINICAL DATA:  Abdominal distention with diarrhea and weakness. EXAM: CT ABDOMEN AND PELVIS WITHOUT CONTRAST TECHNIQUE: Multidetector CT imaging of the abdomen and pelvis was performed following the standard protocol without IV contrast. COMPARISON:  CT abdomen pelvis dated August 16, 2015. FINDINGS: Lower chest: No acute abnormality. Hepatobiliary: No focal liver abnormality. Tiny gallstone. No gallbladder wall thickening or biliary dilatation. Pancreas: Atrophic. No ductal dilatation or surrounding inflammatory changes. Spleen: Normal in size without focal abnormality. Adrenals/Urinary Tract: The adrenal glands are unremarkable. Mild bilateral hydroureteronephrosis. No renal or ureteral calculi. No focal renal lesion. The bladder is decompressed by Foley catheter. Stomach/Bowel: The stomach and small bowel are unremarkable. No obstruction. Large amount of stool throughout the colon with mild wall thickening of the sigmoid colon and rectum with surrounding inflammatory changes,  similar to prior study. Normal appendix. Vascular/Lymphatic: Aortic atherosclerosis. No enlarged abdominal or pelvic lymph nodes. Reproductive: Prostate is unremarkable. Other: Unchanged small fat containing umbilical hernia. No free fluid or pneumoperitoneum. Unchanged presacral soft tissue stranding. Musculoskeletal: No acute or significant osseous findings. Unchanged small lipomas in the right adductor musculature and left rectus femoris muscle. IMPRESSION: 1. 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 2. Mild bilateral hydroureteronephrosis without evident obstruction. 3. Cholelithiasis. 4.  Aortic atherosclerosis (ICD10-I70.0). Electronically Signed   By: Titus Dubin M.D.   On: 06/26/2018 13:27    Medications:  Prior to Admission:  Medications Prior to Admission  Medication Sig Dispense Refill Last Dose  . acetaminophen (TYLENOL) 500 MG tablet Take 1,000 mg by mouth daily as needed for moderate pain or headache.    06/25/2018 at Unknown time  . albuterol (PROAIR HFA) 108 (90 BASE) MCG/ACT inhaler Inhale 2 puffs into the lungs every 6 (six) hours as needed for wheezing or shortness of breath.    >30  . allopurinol (ZYLOPRIM) 300 MG tablet Take 300 mg by mouth daily.     06/25/2018 at Unknown time  . alum & mag hydroxide-simeth (  MAALOX/MYLANTA) 200-200-20 MG/5ML suspension Take 15 mLs by mouth every 6 (six) hours as needed for indigestion or heartburn.   06/25/2018 at Unknown time  . atorvastatin (LIPITOR) 20 MG tablet Take 20 mg by mouth at bedtime.   06/25/2018 at Unknown time  . cholecalciferol (VITAMIN D) 1000 units tablet Take 1,000 Units by mouth daily.   06/25/2018 at Unknown time  . fluticasone (FLONASE) 50 MCG/ACT nasal spray Place 2 sprays into both nostrils daily.    06/25/2018 at Unknown time  . furosemide  (LASIX) 40 MG tablet Take 1 tablet (40 mg total) by mouth every morning. & 20 mg in the evening (Patient taking differently: Take 40 mg by mouth 2 (two) times daily. ) 135 tablet 1 06/25/2018 at Unknown time  . gabapentin (NEURONTIN) 400 MG capsule Take 400 mg by mouth daily.    06/25/2018 at Unknown time  . hydrALAZINE (APRESOLINE) 25 MG tablet Take 75 mg by mouth 3 (three) times daily.   unknown  . insulin lispro (HUMALOG KWIKPEN) 100 UNIT/ML KiwkPen Inject 18-20 Units into the skin as directed. Inject 2 to 18 units based on sliding scale as directed as needed for blood glucose over 200   06/25/2018 at 0900am  . isosorbide mononitrate (IMDUR) 30 MG 24 hr tablet Take 1 tablet (30 mg total) by mouth daily. 90 tablet 0 06/25/2018 at Unknown time  . LANTUS SOLOSTAR 100 UNIT/ML Solostar Pen Inject 20 Units into the skin 2 (two) times daily.    06/25/2018 at 0900pm  . LINZESS 290 MCG CAPS capsule TAKE 1 CAPSULE ONCE DAILY 30 MINUTES BEFORE BREAKFAST (Patient taking differently: Take 290 mcg by mouth daily before breakfast. ) 90 capsule 3 06/25/2018 at Unknown time  . loperamide (IMODIUM) 2 MG capsule Take 2 mg by mouth daily as needed for constipation.   06/25/2018 at Unknown time  . loratadine (CLARITIN) 10 MG tablet Take 10 mg by mouth daily.   06/25/2018 at Unknown time  . metoprolol succinate (TOPROL-XL) 100 MG 24 hr tablet Take 1 tablet (100 mg total) by mouth 2 (two) times daily. Take with or immediately following a meal. 60 tablet 3 06/25/2018 at 0800pm  . Multiple Vitamin (MULTIVITAMIN WITH MINERALS) TABS tablet Take 1 tablet by mouth daily.   06/25/2018 at Unknown time  . ondansetron (ZOFRAN) 4 MG tablet Take 1 tablet (4 mg total) by mouth every 8 (eight) hours as needed for nausea or vomiting. 30 tablet 1 06/25/2018 at Unknown time  . pantoprazole (PROTONIX) 40 MG tablet Take 1 tablet (40 mg total) by mouth daily.   06/25/2018 at Unknown time  . potassium chloride SA (KLOR-CON M20) 20 MEQ tablet Take 1 tablet  (20 mEq total) by mouth daily. 90 tablet 0 06/25/2018 at Unknown time  . vitamin C (ASCORBIC ACID) 500 MG tablet Take 500 mg by mouth 2 (two) times daily.   06/25/2018 at Unknown time   Scheduled: . allopurinol  300 mg Oral Daily  . atorvastatin  20 mg Oral QHS  . enoxaparin (LOVENOX) injection  30 mg Subcutaneous Q24H  . fluticasone  2 spray Each Nare Daily  . insulin aspart  0-15 Units Subcutaneous TID WC  . insulin aspart  0-5 Units Subcutaneous QHS  . insulin aspart  6 Units Subcutaneous TID WC  . insulin glargine  20 Units Subcutaneous BID  . isosorbide mononitrate  30 mg Oral Daily  . linaclotide  290 mcg Oral QAC breakfast  . loratadine  10 mg Oral Daily  .  metoprolol succinate  100 mg Oral BID  . pantoprazole  40 mg Oral Daily   Continuous: . ceFEPime (MAXIPIME) IV    . metronidazole Stopped (06/27/18 0630)  . [START ON 06/28/2018] vancomycin     NKN:[id number] **OR** acetaminophen, albuterol, ondansetron **OR** ondansetron (ZOFRAN) IV, traZODone  Assesment: He has a very complicated situation with severe sepsis perhaps from proctitis and/or urinary tract infection.  He is being treated for infection.  He is better and his lactate level is trending down.  He has still had some episodes of hypotension.  He is known to have severe systolic heart failure with cardiac ejection fraction around 30 to 35%.  He was fluid resuscitated and is positive about 1400.  This will need to be watched very carefully because of his heart failure.  He has urinary retention and now has a Foley catheter in.  He has diabetes which is been uncontrolled and he is on sliding scale  He has an implantable defibrillator in place but no episodes of the defibrillator firing  He has sleep apnea at baseline  He has constipation which is severe and he seems to have proctitis from that.  He still has significant deconditioning from his severe medical illness about 2 years ago and he is not been able to  ambulate unassisted at home since  He was hypokalemic.  And potassium is being replaced but he is still hypokalemic  He is still critically ill although better with multisystem problems.  Complicated decision making is required  He has acute on chronic kidney disease stage III and that is better now.    .Principal Problem:   Severe sepsis ([place]) Active Problems:   GERD (gastroesophageal reflux disease)   Constipation   Sleep apnea   Systolic CHF (HCC)   Debility   Uncontrolled type 2 diabetes mellitus with complication (HCC)   Diarrhea   Obstipation   Acute urinary retention   Mild bilateral Hydronephrosis   CKD (chronic kidney disease) stage 3, GFR 30-59 ml/min (HCC)   NICM (nonischemic cardiomyopathy) ([place])   ICD (implantable cardioverter-defibrillator) in place   Hypokalemia   Hyperglycemia   Proctocolitis   UTI (urinary tract infection)   Pressure injury of skin    Plan: Continue current treatments including antibiotics.  He is coughing some so he is going to have a chest x-ray this morning.  I am going to give him another 12 hours of IV fluids.    LOS: 1 day   Alonza Bogus 06/27/2018, 8:57 AM

## 2018-06-27 NOTE — Consult Note (Signed)
Referring Provider: No ref. provider found Primary Care Physician:  Sinda Du, MD Primary Gastroenterologist:  DR. Gala Romney  Reason for Consultation:  PROCTITIS/OBSTIPATION  ON CT   Impression: ADMITTED WITH LOWER ABDOMINAL PAIN DUE TO URINARY RETENTION, UTI, AND OBSTIPATION. CLINICALLY IMPROVED. CT SHOWS ABNL RECTAL WALL IN SETTING OF FECAL IMPACTION. NEEDS A MORE AGGRESSIVE BOWEL REGIMEN.   Plan: 1. CONTINUE LINZESS. TAKE WITH BREAKFAST. ADD SENOKOT-S BID. 2. CONTINUE TO MONITOR SYMPTOMS. 3. NO INDICATION FOR ENDOSCOPY AT THIS TIME.    HPI:  PT LAST SEEN IN OFC 2018 FOR DIARRHEA. IN USUAL STATE OF HEALTH UNTIL STARTED HAVING LOWER ABDOMINAL PAIN AND WATERY STOOL FOR 2 DAYS. CAME TO ED AND WORKUP REVEALED URINARY RETENTION/UTI/OBSTIPATION WITH ABNL RECTAL WALL. PT CHRONICALLY HAS INTERMITTENT CONSTIPATION AND WATERY STOOLS. WATERY STOOLS WORSE IN PAST 3 DAYS AND ASSOCIATED WITH CRAMPY LOWER ABDOMINAL PAIN.   PT DENIES FEVER, CHILLS, HEMATOCHEZIA, HEMATEMESIS, nausea, vomiting, melena, CHEST PAIN, SHORTNESS OF BREATH,  problems swallowing, OR heartburn or indigestion.  Past Medical History:  Diagnosis Date  . Adenomatous colon polyp 10/30/09   Serrated adenoma removed during Colonoscopy   . AICD (automatic cardioverter/defibrillator) present 06/11/2016  . Arthritis    "legs" (09/27/2015)  . Atrial flutter with rapid ventricular response ([place]) 09/27/2015  . CHF (congestive heart failure) ([place])   . CKD (chronic kidney disease) stage 3, GFR 30-59 ml/min ([facility])   . COPD (chronic obstructive pulmonary disease) ([place])   . Diverticulosis of colon   . Dysrhythmia   . Essential hypertension   . Gastroparesis   . GERD (gastroesophageal reflux disease)   . Gout   . Hyperlipemia   . Hypertension   . OA (osteoarthritis)   . Type 2 diabetes mellitus ([place])     Past Surgical History:  Procedure Laterality Date  . CARDIOVERSION N/A 08/22/2015   Procedure: CARDIOVERSION;  Surgeon:  Larey Dresser, MD;  Location: [place];  Service: Cardiovascular;  Laterality: N/A;  . CARDIOVERSION N/A 09/01/2015   Procedure: CARDIOVERSION;  Surgeon: Lelon Perla, MD;  Location: [place];  Service: Cardiovascular;  Laterality: N/A;  . CATARACT EXTRACTION W/ INTRAOCULAR LENS  IMPLANT, BILATERAL Bilateral   . COLONOSCOPY  10/30/09   BSJ:GGEZ-MOQHU diverticulum/serrated adenoma from ICV, next colonoscopy due 10/2012  . COLONOSCOPY N/A 09/18/2012   TML:YYTKPTW mucosa that was seen appeared normal, however most of it was not seen due to be very poor prep  . COLONOSCOPY N/A 04/08/2013   SFK:CLEXNTZ polyp removed/inadequate preparation  . COLONOSCOPY N/A 06/22/2014   Procedure: COLONOSCOPY;  Surgeon: Daneil Dolin, MD;  [facility];  Service: Endoscopy;  Laterality: N/A;  115   . ELECTROPHYSIOLOGIC STUDY N/A 09/27/2015   Procedure: SVT Ablation;  Surgeon: Evans Lance, MD;  Location: [place] [facility];  Service: Cardiovascular;  Laterality: N/A;  . EP IMPLANTABLE DEVICE  06/11/2016  . ESOPHAGOGASTRODUODENOSCOPY  10/30/09   GYF:VCBSWH   . FLEXIBLE SIGMOIDOSCOPY N/A 08/25/2015   Procedure: FLEXIBLE SIGMOIDOSCOPY;  Surgeon: Jerene Bears, MD;  Location: [facility];  Service: Endoscopy;  Laterality: N/A;  . ICD IMPLANT N/A 06/11/2016   Procedure: ICD Implant;  Surgeon: Evans Lance, MD;  Location: [place] [facility];  Service: Cardiovascular;  Laterality: N/A;  . TEE WITHOUT CARDIOVERSION N/A 08/22/2015   Procedure: TRANSESOPHAGEAL ECHOCARDIOGRAM (TEE);  Surgeon: Larey Dresser, MD;  Location: [place];  Service: Cardiovascular;  Laterality: N/A;  . TEE WITHOUT CARDIOVERSION  09/27/2015   Procedure: Transesophageal Echocardiogram (Tee);  Surgeon:  Evans Lance, MD;  Location: [place] [facility];  Service: Cardiovascular;;  . TONSILLECTOMY  1950s/1960s    Prior to Admission medications   Medication Sig Start Date End Date Taking? Authorizing Provider  acetaminophen  (TYLENOL) 500 MG tablet Take 1,000 mg by mouth daily as needed for moderate pain or headache.    Yes [provider]  albuterol (PROAIR HFA) 108 (90 BASE) MCG/ACT inhaler Inhale 2 puffs into the lungs every 6 (six) hours as needed for wheezing or shortness of breath.    Yes [provider]  allopurinol (ZYLOPRIM) 300 MG tablet Take 300 mg by mouth daily.     Yes [provider]  alum & mag hydroxide-simeth (MAALOX/MYLANTA) 200-200-20 MG/5ML suspension Take 15 mLs by mouth every 6 (six) hours as needed for indigestion or heartburn.   Yes [provider]  atorvastatin (LIPITOR) 20 MG tablet Take 20 mg by mouth at bedtime.   Yes [provider]  cholecalciferol (VITAMIN D) 1000 units tablet Take 1,000 Units by mouth daily.   Yes [provider]  fluticasone (FLONASE) 50 MCG/ACT nasal spray Place 2 sprays into both nostrils daily.  05/29/18  Yes [provider]  furosemide (LASIX) 40 MG tablet Take 1 tablet (40 mg total) by mouth every morning. & 20 mg in the evening Patient taking differently: Take 40 mg by mouth 2 (two) times daily.  06/12/18 09/10/18 Yes BranchAlphonse Guild, MD  gabapentin (NEURONTIN) 400 MG capsule Take 400 mg by mouth daily.    Yes [provider]  hydrALAZINE (APRESOLINE) 25 MG tablet Take 75 mg by mouth 3 (three) times daily.   Yes [provider]  insulin lispro (HUMALOG KWIKPEN) 100 UNIT/ML KiwkPen Inject 18-20 Units into the skin as directed. Inject 2 to 18 units based on sliding scale as directed as needed for blood glucose over 200   Yes [provider]  isosorbide mononitrate (IMDUR) 30 MG 24 hr tablet Take 1 tablet (30 mg total) by mouth daily. 05/04/18  Yes Evans Lance, MD  LANTUS SOLOSTAR 100 UNIT/ML Solostar Pen Inject 20 Units into the skin 2 (two) times daily.  06/23/18  Yes [provider]  LINZESS 290 MCG CAPS capsule TAKE 1 CAPSULE ONCE DAILY [place]  BREAKFAST Patient taking differently: Take 290 mcg by mouth daily before breakfast.  12/16/17  Yes Annitta Needs, NP  loperamide (IMODIUM) 2 MG capsule Take 2 mg by mouth daily as needed for constipation. 07/24/15  Yes [provider]  loratadine (CLARITIN) 10 MG tablet Take 10 mg by mouth daily.   Yes [provider]  metoprolol succinate (TOPROL-XL) 100 MG 24 hr tablet Take 1 tablet (100 mg total) by mouth 2 (two) times daily. Take with or immediately following a meal. 10/18/15  Yes Branch, Alphonse Guild, MD  Multiple Vitamin (MULTIVITAMIN WITH MINERALS) TABS tablet Take 1 tablet by mouth daily. 09/07/15  Yes Hosie Poisson, MD  ondansetron (ZOFRAN) 4 MG tablet Take 1 tablet (4 mg total) by mouth every 8 (eight) hours as needed for nausea or vomiting. 10/17/15  Yes Carlis Stable, NP  pantoprazole (PROTONIX) 40 MG tablet Take 1 tablet (40 mg total) by mouth daily. 09/07/15  Yes Hosie Poisson, MD  potassium chloride SA (KLOR-CON M20) 20 MEQ tablet Take 1 tablet (20 mEq total) by mouth daily. 05/06/18 08/04/18 Yes BranchAlphonse Guild, MD  vitamin C (ASCORBIC ACID) 500 MG tablet Take 500 mg by mouth 2 (two) times  daily.   Yes [provider]    Current Facility-Administered Medications  Medication Dose Route Frequency Provider Last Rate Last Dose  . 0.9 % NaCl with KCl 20 mEq/ L  infusion   Intravenous Continuous Murlean Iba, MD   Stopped at 06/27/18 [phone]  . acetaminophen (TYLENOL) tablet 650 mg  650 mg Oral Q6H PRN Wynetta Emery, Clanford L, MD   650 mg at 06/27/18 0805   Or  . acetaminophen (TYLENOL) suppository 650 mg  650 mg Rectal Q6H PRN Johnson, Clanford L, MD      . albuterol (PROVENTIL) (2.5 MG/3ML) 0.083% nebulizer solution 2.5 mg  2.5 mg Nebulization Q6H PRN Johnson, Clanford L, MD      . allopurinol (ZYLOPRIM) tablet 300 mg  300 mg Oral Daily Johnson, Clanford L, MD      . atorvastatin (LIPITOR) tablet 20 mg  20 mg Oral QHS Johnson, Clanford L, MD   20 mg at 06/26/18 2221   . ceFEPIme (MAXIPIME) 2 g in sodium chloride 0.9 % 100 mL IVPB  2 g Intravenous Q24H Johnson, Clanford L, MD      . enoxaparin (LOVENOX) injection 30 mg  30 mg Subcutaneous Q24H Johnson, Clanford L, MD   30 mg at 06/26/18 2220  . fluticasone (FLONASE) 50 MCG/ACT nasal spray 2 spray  2 spray Each Nare Daily Johnson, Clanford L, MD      . insulin aspart (novoLOG) injection 0-15 Units  0-15 Units Subcutaneous TID WC Wynetta Emery, Clanford L, MD   3 Units at 06/27/18 0803  . insulin aspart (novoLOG) injection 0-5 Units  0-5 Units Subcutaneous QHS Irwin Brakeman L, MD   4 Units at 06/26/18 2223  . insulin aspart (novoLOG) injection 6 Units  6 Units Subcutaneous TID WC Wynetta Emery, Clanford L, MD   6 Units at 06/27/18 0803  . insulin glargine (LANTUS) injection 20 Units  20 Units Subcutaneous BID Murlean Iba, MD   20 Units at 06/26/18 2223  . isosorbide mononitrate (IMDUR) 24 hr tablet 30 mg  30 mg Oral Daily Johnson, Clanford L, MD      . linaclotide (LINZESS) capsule 290 mcg  290 mcg Oral QAC breakfast Wynetta Emery, Clanford L, MD   290 mcg at 06/27/18 0804  . loratadine (CLARITIN) tablet 10 mg  10 mg Oral Daily Johnson, Clanford L, MD      . metoprolol succinate (TOPROL-XL) 24 hr tablet 100 mg  100 mg Oral BID Johnson, Clanford L, MD      . metroNIDAZOLE (FLAGYL) IVPB 500 mg  500 mg Intravenous Q8H Johnson, Clanford L, MD   Stopped at 06/27/18 0630  . ondansetron (ZOFRAN) tablet 4 mg  4 mg Oral Q6H PRN Johnson, Clanford L, MD       Or  . ondansetron (ZOFRAN) injection 4 mg  4 mg Intravenous Q6H PRN Johnson, Clanford L, MD      . pantoprazole (PROTONIX) EC tablet 40 mg  40 mg Oral Daily Johnson, Clanford L, MD      . traZODone (DESYREL) tablet 25 mg  25 mg Oral QHS PRN Johnson, Clanford L, MD      . Derrill Memo ON 06/28/2018] vancomycin (VANCOCIN) 1,500 mg in sodium chloride 0.9 % 500 mL IVPB  1,500 mg Intravenous Q48H Johnson, Clanford L, MD        Allergies as of 06/26/2018  . (No Known Allergies)     Family History  Problem Relation Age of Onset  . COPD Father   . Diabetes Mother   .  Hypertension Mother   . Colon cancer Neg Hx      Social History   Socioeconomic History  . Marital status: Married    Spouse name: Not on file  . Number of children: 2  . Years of education: Not on file  . Highest education level: Not on file  Occupational History  . Occupation: retired  Scientific laboratory technician  . Financial resource strain: Not on file  . Food insecurity:    Worry: Not on file    Inability: Not on file  . Transportation needs:    Medical: Not on file    Non-medical: Not on file  Tobacco Use  . Smoking status: Former Smoker    Packs/day: 1.00    Years: 4.00    Pack years: 4.00    Types: Cigarettes    Last attempt to quit: 08/26/2010    Years since quitting: 7.8  . Smokeless tobacco: Never Used  Substance and Sexual Activity  . Alcohol use: No    Alcohol/week: 0.0 standard drinks    Comment: "stopped in 2012 when I quit smoking"  . Drug use: No  . Sexual activity: Not Currently  Lifestyle  . Physical activity:    Days per week: Not on file    Minutes per session: Not on file  . Stress: Not on file  Relationships  . Social connections:    Talks on phone: Not on file    Gets together: Not on file    Attends religious service: Not on file    Active member of club or organization: Not on file    Attends meetings of clubs or organizations: Not on file    Relationship status: Not on file  . Intimate partner violence:    Fear of current or ex partner: Not on file    Emotionally abused: Not on file    Physically abused: Not on file    Forced sexual activity: Not on file  Other Topics Concern  . Not on file  Social History Narrative  . Not on file    Review of Systems: PER HPI OTHERWISE ALL SYSTEMS ARE NEGATIVE.   Vitals: Blood pressure 127/76, pulse 100, temperature 100.2 F (37.9 C), temperature source Oral, resp. rate 13, height 5' 6"  (1.676 m), weight 114.1  kg, SpO2 99 %.  Physical Exam: General:   Alert,  Well-developed, well-nourished, pleasant and cooperative in NAD Head:  Normocephalic and atraumatic. Eyes:  Sclera clear, no icterus.   Conjunctiva pink. Mouth:  No lesions, dentition normal. Neck:  Supple; no masses. Lungs:  Clear throughout to auscultation.   No wheezes. No acute distress. Heart:  Regular rate and rapid rhythm; 2/6 systolic murmur. Abdomen:  Soft, nontender and nondistended. No masses noted. Normal bowel sounds, without guarding, and without rebound.   Msk:  Symmetrical without gross deformities. Normal posture. Extremities:  Without edema. Neurologic:  Alert and  INTERACTIVE, NO  NEW FOCAL DEFICITS Cervical Nodes:  No significant cervical adenopathy. Psych:  Alert and cooperative. Normal mood and FLAT affect.   Lab Results: Recent Labs    06/26/18 1237 06/27/18 0432  WBC 9.9 6.6  HGB 14.0 12.0*  HCT 40.8 36.0*  PLT 199 171   BMET Recent Labs    06/26/18 1237 06/27/18 0432  NA 133* 137  K 3.2* 3.3*  CL 91* 102  CO2 22 25  GLUCOSE 430* 177*  BUN 43* 40*  CREATININE 3.09* 2.59*  CALCIUM 9.0 8.6*   LFT Recent Labs  06/27/18 0432  PROT 6.1*  ALBUMIN 3.6  AST 32  ALT 17  ALKPHOS 40  BILITOT 0.9     Studies/Results: CT SCAN W/O I VC-FECAL IMPACTION/ABNL RECTAL WALL, STOOL IN COLON   LOS: 1 day   Sandi Fields  06/27/2018, 8:36 AM

## 2018-06-28 ENCOUNTER — Inpatient Hospital Stay (HOSPITAL_COMMUNITY): Payer: Medicare Other

## 2018-06-28 ENCOUNTER — Encounter (HOSPITAL_COMMUNITY): Payer: Self-pay | Admitting: Gastroenterology

## 2018-06-28 DIAGNOSIS — K5901 Slow transit constipation: Secondary | ICD-10-CM

## 2018-06-28 LAB — BASIC METABOLIC PANEL
Anion gap: 10 (ref 5–15)
BUN: 38 mg/dL — ABNORMAL HIGH (ref 8–23)
CO2: 24 mmol/L (ref 22–32)
Calcium: 8.9 mg/dL (ref 8.9–10.3)
Chloride: 102 mmol/L (ref 98–111)
Creatinine, Ser: 2.34 mg/dL — ABNORMAL HIGH (ref 0.61–1.24)
GFR calc Af Amer: 32 mL/min — ABNORMAL LOW (ref >60)
GFR calc non Af Amer: 28 mL/min — ABNORMAL LOW (ref >60)
Glucose, Bld: 240 mg/dL — ABNORMAL HIGH (ref 70–99)
Potassium: 3.3 mmol/L — ABNORMAL LOW (ref 3.5–5.1)
Sodium: 136 mmol/L (ref 135–145)

## 2018-06-28 LAB — GLUCOSE, CAPILLARY
Glucose-Capillary: 173 mg/dL — ABNORMAL HIGH (ref 70–99)
Glucose-Capillary: 173 mg/dL — ABNORMAL HIGH (ref 70–99)
Glucose-Capillary: 215 mg/dL — ABNORMAL HIGH (ref 70–99)
Glucose-Capillary: 145 mg/dL — ABNORMAL HIGH (ref 70–99)

## 2018-06-28 LAB — LACTIC ACID, PLASMA: Lactic Acid, Venous: 2 mmol/L (ref 0.5–1.9)

## 2018-06-28 MED ORDER — SENNOSIDES-DOCUSATE SODIUM 8.6-50 MG PO TABS: 1.0000 | ORAL_TABLET | Freq: Every day | ORAL | Status: DC

## 2018-06-28 MED ORDER — POTASSIUM CHLORIDE CRYS ER 20 MEQ PO TBCR
20.0000 meq | EXTENDED_RELEASE_TABLET | Freq: Two times a day (BID) | ORAL | Status: DC
  Administered 2018-06-28 – 2018-07-01 (×7): 20 meq via ORAL
  Filled 2018-06-28 (×7): qty 1

## 2018-06-28 NOTE — Progress Notes (Signed)
  Assessment/Plan: ADMITTED WITH UROSEPSIS AND LOOSE STOOL DUE TO OVERFLOW INCONTINENCE IN SETTING OF CONSTIPATION. HAS HAD MULTIPLE BMs SINCE ADMISSION AFTER ENEMAS, LINZESS, AND SENOKOT. PT HAS PMHx: GASTROPARESIS.  PLAN: 1. REDUCE SENOKOT TO ONCE DAILY. 2. CONTINUE LINZESS. 3. CHANGE TO DYSPHAGIA 3/CARB MOD/LOW FAT DIET.    Subjective: Since I last evaluated the patient HE HAS HAD > 3 BMs: NL/SOFT/LOOSE. No questions or concerns. DENIES  ABDOMINAL PAIN/NAUSEA OR VOMITING.  Objective: Vital signs in last 24 hours: Vitals:   06/28/18 0700 06/28/18 0746  BP: 134/72   Pulse: 73 81  Resp: (!) 7 13  Temp:  99.8 F (37.7 C)  SpO2: 99% 98%   Physical Exam  Constitutional: No distress.  Cardiovascular: Normal rate and regular rhythm.  Murmur heard. Pulmonary/Chest: Effort normal and breath sounds normal. No respiratory distress.  Abdominal: Soft. Bowel sounds are normal. He exhibits no distension. There is no abdominal tenderness.  Neurological: He is alert.  NO  NEW FOCAL DEFICITS  Psychiatric:  FLAT AFFECT, NL MOOD   Lab Results:  K 3.3 Cr 2.34  Studies/Results: US Renal  Result Date: 06/27/2018 CLINICAL DATA:  Bilateral hydronephrosis. EXAM: RENAL / URINARY TRACT ULTRASOUND COMPLETE COMPARISON:  CT scan June 26, 2018 FINDINGS: Right Kidney: Renal measurements: 9.0 x 4.6 x 4.9 cm = volume: 106.5 mL. Mild hydronephrosis. No other abnormalities. Left Kidney: Renal measurements: 10.6 x 4.7 x 5.4 cm = volume: 142.6 mL. No hydronephrosis seen on limited views. Bladder: Decompressed with a Foley catheter not well evaluated. IMPRESSION: 1. Mild hydronephrosis remains on the right, unchanged since yesterday's CT scan. No hydronephrosis seen on limited views of the left kidney. The bladder is decompressed with a Foley catheter are not well evaluated. Electronically Signed   By: Dorise Bullion III M.D   On: 06/27/2018 12:01   Dg Chest Port 1 View  Result Date: 06/28/2018 CLINICAL  DATA:  CHF. EXAM: PORTABLE CHEST 1 VIEW COMPARISON:  June 27, 2018 FINDINGS: The heart size and mediastinal contours are within normal limits. Both lungs are clear. The visualized skeletal structures are unremarkable. IMPRESSION: No active disease. Electronically Signed   By: Dorise Bullion III M.D   On: 06/28/2018 08:32    Medications: I have reviewed the patient's current medications.

## 2018-06-28 NOTE — Progress Notes (Signed)
Subjective: He says he feels better.  He is a little confused and says it is 2002 but he does have some intellectual disability at baseline.  He still has Foley catheter in place.  No other new complaints.  He is coughing some.  Chest x-ray yesterday did not show any acute changes  Objective: Vital signs in last 24 hours: Temp:  [99.8 F (37.7 C)-100.1 F (37.8 C)] 99.8 F (37.7 C) (04/26 0746) Pulse Rate:  [26-94] 81 (04/26 0746) Resp:  [7-30] 13 (04/26 0746) BP: (107-141)/(50-92) 134/72 (04/26 0700) SpO2:  [94 %-99 %] 98 % (04/26 0746) Weight:  [114.3 kg] 114.3 kg (04/26 0500) Weight change: 3.169 kg Last BM Date: 06/27/18  Intake/Output from previous day: 04/25 0701 - 04/26 0700 In: 814.9 [P.O.:240; I.V.:231.6; IV Piggyback:343.3] Out: 1600 [Urine:1600]  PHYSICAL EXAM General appearance: alert, cooperative and no distress Resp: clear to auscultation bilaterally Cardio: regular rate and rhythm, S1, S2 normal, no murmur, click, rub or gallop GI: soft, non-tender; bowel sounds normal; no masses,  no organomegaly Extremities: Trace edema bilaterally  Lab Results:  Results for orders placed or performed during the hospital encounter of 06/26/18 (from the past 48 hour(s))  Urinalysis, Routine w reflex microscopic     Status: Abnormal   Collection Time: 06/26/18 12:19 PM  Result Value Ref Range   Color, Urine YELLOW YELLOW   APPearance CLEAR CLEAR   Specific Gravity, Urine 1.011 1.005 - 1.030   pH 5.0 5.0 - 8.0   Glucose, UA 50 (A) NEGATIVE mg/dL   Hgb urine dipstick NEGATIVE NEGATIVE   Bilirubin Urine NEGATIVE NEGATIVE   Ketones, ur NEGATIVE NEGATIVE mg/dL   Protein, ur 30 (A) NEGATIVE mg/dL   Nitrite NEGATIVE NEGATIVE   Leukocytes,Ua SMALL (A) NEGATIVE   RBC / HPF 21-50 0 - 5 RBC/hpf   WBC, UA 11-20 0 - 5 WBC/hpf   Bacteria, UA RARE (A) NONE SEEN   Hyaline Casts, UA PRESENT     Comment: Performed at [facility], [address] [place], [place] [address]   Comprehensive metabolic panel     Status: Abnormal   Collection Time: 06/26/18 12:37 PM  Result Value Ref Range   Sodium 133 (L) 135 - 145 mmol/L   Potassium 3.2 (L) 3.5 - 5.1 mmol/L   Chloride 91 (L) 98 - 111 mmol/L   CO2 22 22 - 32 mmol/L   Glucose, Bld 430 (H) 70 - 99 mg/dL   BUN 43 (H) 8 - 23 mg/dL   Creatinine, Ser 3.09 (H) 0.61 - 1.24 mg/dL   Calcium 9.0 8.9 - 10.3 mg/dL   Total Protein 7.3 6.5 - 8.1 g/dL   Albumin 4.3 3.5 - 5.0 g/dL   AST 30 15 - 41 U/L   ALT <5 0 - 44 U/L   Alkaline Phosphatase 53 38 - 126 U/L   Total Bilirubin 0.9 0.3 - 1.2 mg/dL   GFR calc non Af Amer 20 (L) >60 mL/min   GFR calc Af Amer 23 (L) >60 mL/min   Anion gap 20 (H) 5 - 15    Comment: Performed at [facility], [address] [place], [place] [address]  Lipase, blood     Status: Abnormal   Collection Time: 06/26/18 12:37 PM  Result Value Ref Range   Lipase 52 (H) 11 - 51 U/L    Comment: Performed at [facility], [address] [place], [place] [address]  CBC with Differential     Status: Abnormal   Collection  Time: 06/26/18 12:37 PM  Result Value Ref Range   WBC 9.9 4.0 - 10.5 K/uL   RBC 4.45 4.22 - 5.81 MIL/uL   Hemoglobin 14.0 13.0 - 17.0 g/dL   HCT 40.8 39.0 - 52.0 %   MCV 91.7 80.0 - 100.0 fL   MCH 31.5 26.0 - 34.0 pg   MCHC 34.3 30.0 - 36.0 g/dL   RDW 15.4 11.5 - 15.5 %   Platelets 199 150 - 400 K/uL   nRBC 0.0 0.0 - 0.2 %   Neutrophils Relative % 83 %   Neutro Abs 8.2 (H) 1.7 - 7.7 K/uL   Lymphocytes Relative 8 %   Lymphs Abs 0.8 0.7 - 4.0 K/uL   Monocytes Relative 7 %   Monocytes Absolute 0.7 0.1 - 1.0 K/uL   Eosinophils Relative 2 %   Eosinophils Absolute 0.2 0.0 - 0.5 K/uL   Basophils Relative 0 %   Basophils Absolute 0.0 0.0 - 0.1 K/uL   Immature Granulocytes 0 %   Abs Immature Granulocytes 0.02 0.00 - 0.07 K/uL   Reactive, Benign Lymphocytes PRESENT     Comment: Performed at [facility], [address] [place], [place] [address]  Troponin I - Once     Status:  Abnormal   Collection Time: 06/26/18 12:37 PM  Result Value Ref Range   Troponin I 0.23 (HH) <0.03 ng/mL    Comment: CRITICAL RESULT CALLED TO, READ BACK BY AND VERIFIED WITH:  DANIEL, B @ 1323 ON [id number] BY HENDERSON L. Performed at [facility], [address], [place], [place] [address]   Lactic acid, plasma     Status: Abnormal   Collection Time: 06/26/18 12:37 PM  Result Value Ref Range   Lactic Acid, Venous 5.5 (HH) 0.5 - 1.9 mmol/L    Comment: CRITICAL RESULT CALLED TO, READ BACK BY AND VERIFIED WITH: WINNINGHAM C. @ 1307 ON [id number] BY HENDERSON L. Performed at [facility], [address] [place], [place] [address]   HIV antibody (Routine Testing)     Status: None   Collection Time: 06/26/18 12:37 PM  Result Value Ref Range   HIV Screen 4th Generation wRfx Non Reactive Non Reactive    Comment: (NOTE) Performed At: [facility] [place], [place] [id number] Rush Farmer MD MW:[id number]   Blood Culture (routine x 2)     Status: None (Preliminary result)   Collection Time: 06/26/18  2:29 PM  Result Value Ref Range   Specimen Description      BLOOD RIGHT ARM BOTTLES DRAWN AEROBIC AND ANAEROBIC   Special Requests Blood Culture adequate volume    Culture      NO GROWTH 2 DAYS Performed at [facility], [address], [place], [place] [address]    Report Status PENDING   Lactic acid, plasma     Status: Abnormal   Collection Time: 06/26/18  2:35 PM  Result Value Ref Range   Lactic Acid, Venous 6.5 (HH) 0.5 - 1.9 mmol/L    Comment: CRITICAL RESULT CALLED TO, READ BACK BY AND VERIFIED WITH: MARTIN D. @ 1533 ON [id number] BY HENDERSON L Performed at [facility], [address] [place], [place] [address]   Blood Culture (routine x 2)     Status: None (Preliminary result)   Collection Time: 06/26/18  2:36 PM  Result Value Ref Range   Specimen Description      BLOOD RIGHT ARM BOTTLES DRAWN AEROBIC AND ANAEROBIC   Special Requests Blood Culture  adequate volume  Culture      NO GROWTH 2 DAYS Performed at [facility], [address], [place], [place] [address]    Report Status PENDING   MRSA PCR Screening     Status: None   Collection Time: 06/26/18  5:02 PM  Result Value Ref Range   MRSA by PCR NEGATIVE NEGATIVE    Comment:        The GeneXpert MRSA Assay (FDA approved for NASAL specimens only), is one component of a comprehensive MRSA colonization surveillance program. It is not intended to diagnose MRSA infection nor to guide or monitor treatment for MRSA infections. Performed at [facility], [address], [place], [place] [address]   Glucose, capillary     Status: Abnormal   Collection Time: 06/26/18  5:31 PM  Result Value Ref Range   Glucose-Capillary 340 (H) 70 - 99 mg/dL  C difficile quick scan w PCR reflex     Status: None   Collection Time: 06/26/18  6:44 PM  Result Value Ref Range   C Diff antigen NEGATIVE NEGATIVE   C Diff toxin NEGATIVE NEGATIVE   C Diff interpretation No C. difficile detected.     Comment: Performed at [facility], [address] [place], [place] [address]  Lactic acid, plasma     Status: Abnormal   Collection Time: 06/26/18  7:56 PM  Result Value Ref Range   Lactic Acid, Venous 5.0 (HH) 0.5 - 1.9 mmol/L    Comment: CRITICAL RESULT CALLED TO, READ BACK BY AND VERIFIED WITH: DANIELS,J. AT 2030 ON 06/26/2018 BY EVA Performed at [facility], [address], [place], [place] [address]   Glucose, capillary     Status: Abnormal   Collection Time: 06/26/18  9:12 PM  Result Value Ref Range   Glucose-Capillary 324 (H) 70 - 99 mg/dL   Comment 1 Notify RN    Comment 2 Document in Chart   Lactic acid, plasma     Status: Abnormal   Collection Time: 06/26/18 10:57 PM  Result Value Ref Range   Lactic Acid, Venous 3.4 (HH) 0.5 - 1.9 mmol/L    Comment: CRITICAL RESULT CALLED TO, READ BACK BY AND VERIFIED WITH: DANIELS,J @ 2321 ON 06/26/18 BY JUW Performed at [facility], [address] [place], [place] [address]   Glucose, capillary     Status: Abnormal   Collection Time: 06/27/18  2:54 AM  Result Value Ref Range   Glucose-Capillary 173 (H) 70 - 99 mg/dL   Comment 1 Notify RN    Comment 2 Document in Chart   Lactic acid, plasma     Status: Abnormal   Collection Time: 06/27/18  4:32 AM  Result Value Ref Range   Lactic Acid, Venous 2.5 (HH) 0.5 - 1.9 mmol/L    Comment: CRITICAL RESULT CALLED TO, READ BACK BY AND VERIFIED WITH: DANIELS AT 0542 ON 06/27/2018 BY MOSLEYJ Performed at [facility], [address], [place], [place] [address]   Comprehensive metabolic panel     Status: Abnormal   Collection Time: 06/27/18  4:32 AM  Result Value Ref Range   Sodium 137 135 - 145 mmol/L   Potassium 3.3 (L) 3.5 - 5.1 mmol/L   Chloride 102 98 - 111 mmol/L   CO2 25 22 - 32 mmol/L   Glucose, Bld 177 (H) 70 - 99 mg/dL   BUN 40 (H) 8 - 23 mg/dL   Creatinine, Ser 2.59 (H) 0.61 - 1.24 mg/dL   Calcium 8.6 (L) 8.9 -  10.3 mg/dL   Total Protein 6.1 (L) 6.5 - 8.1 g/dL   Albumin 3.6 3.5 - 5.0 g/dL   AST 32 15 - 41 U/L   ALT 17 0 - 44 U/L   Alkaline Phosphatase 40 38 - 126 U/L   Total Bilirubin 0.9 0.3 - 1.2 mg/dL   GFR calc non Af Amer 25 (L) >60 mL/min   GFR calc Af Amer 29 (L) >60 mL/min   Anion gap 10 5 - 15    Comment: Performed at [facility], [address] [place], [place] [address]  Magnesium     Status: None   Collection Time: 06/27/18  4:32 AM  Result Value Ref Range   Magnesium 2.1 1.7 - 2.4 mg/dL    Comment: Performed at [facility], [address] [place], [place] [address]  CBC WITH DIFFERENTIAL     Status: Abnormal   Collection Time: 06/27/18  4:32 AM  Result Value Ref Range   WBC 6.6 4.0 - 10.5 K/uL   RBC 3.86 (L) 4.22 - 5.81 MIL/uL   Hemoglobin 12.0 (L) 13.0 - 17.0 g/dL   HCT 36.0 (L) 39.0 - 52.0 %   MCV 93.3 80.0 - 100.0 fL   MCH 31.1 26.0 - 34.0 pg   MCHC 33.3 30.0 - 36.0 g/dL   RDW 15.5 11.5 - 15.5 %   Platelets 171 150 - 400 K/uL   nRBC 0.0 0.0 - 0.2  %   Neutrophils Relative % 65 %   Neutro Abs 4.2 1.7 - 7.7 K/uL   Lymphocytes Relative 21 %   Lymphs Abs 1.4 0.7 - 4.0 K/uL   Monocytes Relative 14 %   Monocytes Absolute 0.9 0.1 - 1.0 K/uL   Eosinophils Relative 0 %   Eosinophils Absolute 0.0 0.0 - 0.5 K/uL   Basophils Relative 0 %   Basophils Absolute 0.0 0.0 - 0.1 K/uL   Immature Granulocytes 0 %   Abs Immature Granulocytes 0.02 0.00 - 0.07 K/uL    Comment: Performed at [facility], [address], [place],  [address]  Glucose, capillary     Status: Abnormal   Collection Time: 06/27/18  7:28 AM  Result Value Ref Range   Glucose-Capillary 174 (H) 70 - 99 mg/dL  Glucose, capillary     Status: Abnormal   Collection Time: 06/27/18 11:54 AM  Result Value Ref Range   Glucose-Capillary 202 (H) 70 - 99 mg/dL  Glucose, capillary     Status: Abnormal   Collection Time: 06/27/18  4:16 PM  Result Value Ref Range   Glucose-Capillary 154 (H) 70 - 99 mg/dL  Glucose, capillary     Status: Abnormal   Collection Time: 06/27/18 10:13 PM  Result Value Ref Range   Glucose-Capillary 150 (H) 70 - 99 mg/dL  Glucose, capillary     Status: Abnormal   Collection Time: 06/28/18  3:25 AM  Result Value Ref Range   Glucose-Capillary 173 (H) 70 - 99 mg/dL   Comment 1 Notify RN    Comment 2 Document in Chart   Glucose, capillary     Status: Abnormal   Collection Time: 06/28/18  7:45 AM  Result Value Ref Range   Glucose-Capillary 173 (H) 70 - 99 mg/dL    ABGS No results for input(s): PHART, PO2ART, TCO2, HCO3 in the last 72 hours.  Invalid input(s): PCO2 CULTURES Recent Results (from the past 240 hour(s))  Blood Culture (routine x 2)     Status: None (Preliminary result)  Collection Time: 06/26/18  2:29 PM  Result Value Ref Range Status   Specimen Description   Final    BLOOD RIGHT ARM BOTTLES DRAWN AEROBIC AND ANAEROBIC   Special Requests Blood Culture adequate volume  Final   Culture   Final    NO GROWTH 2 DAYS Performed at [facility], [address] [place], [place] [address]    Report Status PENDING  Incomplete  Blood Culture (routine x 2)     Status: None (Preliminary result)   Collection Time: 06/26/18  2:36 PM  Result Value Ref Range Status   Specimen Description   Final    BLOOD RIGHT ARM BOTTLES DRAWN AEROBIC AND ANAEROBIC   Special Requests Blood Culture adequate volume  Final   Culture   Final    NO GROWTH 2 DAYS Performed at [facility], [address] [place], [place] [address]    Report Status PENDING  Incomplete  MRSA PCR Screening     Status: None   Collection Time: 06/26/18  5:02 PM  Result Value Ref Range Status   MRSA by PCR NEGATIVE NEGATIVE Final    Comment:        The GeneXpert MRSA Assay (FDA approved for NASAL specimens only), is one component of a comprehensive MRSA colonization surveillance program. It is not intended to diagnose MRSA infection nor to guide or monitor treatment for MRSA infections. Performed at [facility], [address] [place], [place] [address]   C difficile quick scan w PCR reflex     Status: None   Collection Time: 06/26/18  6:44 PM  Result Value Ref Range Status   C Diff antigen NEGATIVE NEGATIVE Final   C Diff toxin NEGATIVE NEGATIVE Final   C Diff interpretation No C. difficile detected.  Final    Comment: Performed at [facility], [address] [place],  [address]   Studies/Results: Ct Abdomen Pelvis Wo Contrast  Result Date: 06/26/2018 CLINICAL DATA:  Abdominal distention with diarrhea and weakness. EXAM: CT ABDOMEN AND PELVIS WITHOUT CONTRAST TECHNIQUE: Multidetector CT imaging of the abdomen and pelvis was performed following the standard protocol without IV contrast. COMPARISON:  CT abdomen pelvis dated August 16, 2015. FINDINGS: Lower chest: No acute abnormality. Hepatobiliary: No focal liver abnormality. Tiny gallstone. No gallbladder wall thickening or biliary dilatation. Pancreas: Atrophic. No ductal dilatation or surrounding  inflammatory changes. Spleen: Normal in size without focal abnormality. Adrenals/Urinary Tract: The adrenal glands are unremarkable. Mild bilateral hydroureteronephrosis. No renal or ureteral calculi. No focal renal lesion. The bladder is decompressed by Foley catheter. Stomach/Bowel: The stomach and small bowel are unremarkable. No obstruction. Large amount of stool throughout the colon with mild wall thickening of the sigmoid colon and rectum with surrounding inflammatory changes, similar to prior study. Normal appendix. Vascular/Lymphatic: Aortic atherosclerosis. No enlarged abdominal or pelvic lymph nodes. Reproductive: Prostate is unremarkable. Other: Unchanged small fat containing umbilical hernia. No free fluid or pneumoperitoneum. Unchanged presacral soft tissue stranding. Musculoskeletal: No acute or significant osseous findings. Unchanged small lipomas in the right adductor musculature and left rectus femoris muscle. IMPRESSION: 1. 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 2. Mild bilateral hydroureteronephrosis without evident obstruction. 3. Cholelithiasis. 4.  Aortic atherosclerosis (ICD10-I70.0). Electronically Signed   By: Titus Dubin M.D.   On: 06/26/2018 13:27   US Renal  Result Date: 06/27/2018 CLINICAL DATA:  Bilateral  hydronephrosis. EXAM: RENAL / URINARY TRACT ULTRASOUND COMPLETE COMPARISON:  CT scan June 26, 2018 FINDINGS: Right Kidney: Renal measurements: 9.0 x 4.6 x 4.9 cm = volume: 106.5 mL. Mild hydronephrosis. No other abnormalities. Left Kidney: Renal measurements: 10.6 x 4.7 x 5.4 cm = volume: 142.6 mL. No hydronephrosis seen on limited views. Bladder: Decompressed with a Foley catheter not well evaluated. IMPRESSION: 1. Mild hydronephrosis remains on the right, unchanged since yesterday's CT  scan. No hydronephrosis seen on limited views of the left kidney. The bladder is decompressed with a Foley catheter are not well evaluated. Electronically Signed   By: Dorise Bullion III M.D   On: 06/27/2018 12:01   Dg Chest Port 1 View  Result Date: 06/28/2018 CLINICAL DATA:  CHF. EXAM: PORTABLE CHEST 1 VIEW COMPARISON:  June 27, 2018 FINDINGS: The heart size and mediastinal contours are within normal limits. Both lungs are clear. The visualized skeletal structures are unremarkable. IMPRESSION: No active disease. Electronically Signed   By: Dorise Bullion III M.D   On: 06/28/2018 08:32   Portable Chest 1 View  Result Date: 06/27/2018 CLINICAL DATA:  Severe sepsis. EXAM: PORTABLE CHEST 1 VIEW COMPARISON:  June 12, 2016 FINDINGS: Stable AICD device. No pneumothorax. No change in the cardiomediastinal silhouette. Mild increased thickening of the right fissure. No focal infiltrate in the lungs. IMPRESSION: Mild increased thickening of the right fissure could represent pleural thickening versus a tiny amount of fluid in the fissure. No acute infiltrate. No cause for sepsis noted. Electronically Signed   By: Dorise Bullion III M.D   On: 06/27/2018 10:19    Medications:  Prior to Admission:  Medications Prior to Admission  Medication Sig Dispense Refill Last Dose  . acetaminophen (TYLENOL) 500 MG tablet Take 1,000 mg by mouth daily as needed for moderate pain or headache.    06/25/2018 at Unknown time  . albuterol (PROAIR HFA) 108 (90 BASE) MCG/ACT inhaler Inhale 2 puffs into the lungs every 6 (six) hours as needed for wheezing or shortness of breath.    >30  . allopurinol (ZYLOPRIM) 300 MG tablet Take 300 mg by mouth daily.     06/25/2018 at Unknown time  . alum & mag hydroxide-simeth (MAALOX/MYLANTA) 200-200-20 MG/5ML suspension Take 15 mLs by mouth every 6 (six) hours as needed for indigestion or heartburn.   06/25/2018 at Unknown time  . atorvastatin (LIPITOR) 20 MG tablet Take 20 mg by mouth at  bedtime.   06/25/2018 at Unknown time  . cholecalciferol (VITAMIN D) 1000 units tablet Take 1,000 Units by mouth daily.   06/25/2018 at Unknown time  . fluticasone (FLONASE) 50 MCG/ACT nasal spray Place 2 sprays into both nostrils daily.    06/25/2018 at Unknown time  . furosemide (LASIX) 40 MG tablet Take 1 tablet (40 mg total) by mouth every morning. & 20 mg in the evening (Patient taking differently: Take 40 mg by mouth 2 (two) times daily. ) 135 tablet 1 06/25/2018 at Unknown time  . gabapentin (NEURONTIN) 400 MG capsule Take 400 mg by mouth daily.    06/25/2018 at Unknown time  . hydrALAZINE (APRESOLINE) 25 MG tablet Take 75 mg by mouth 3 (three) times daily.   unknown  . insulin lispro (HUMALOG KWIKPEN) 100 UNIT/ML KiwkPen Inject 18-20 Units into the skin as directed. Inject 2 to 18 units based on sliding scale as directed as needed for blood glucose over 200   06/25/2018 at 0900am  . isosorbide mononitrate (IMDUR) 30 MG 24 hr tablet  Take 1 tablet (30 mg total) by mouth daily. 90 tablet 0 06/25/2018 at Unknown time  . LANTUS SOLOSTAR 100 UNIT/ML Solostar Pen Inject 20 Units into the skin 2 (two) times daily.    06/25/2018 at 0900pm  . LINZESS 290 MCG CAPS capsule TAKE 1 CAPSULE ONCE DAILY 30 MINUTES BEFORE BREAKFAST (Patient taking differently: Take 290 mcg by mouth daily before breakfast. ) 90 capsule 3 06/25/2018 at Unknown time  . loperamide (IMODIUM) 2 MG capsule Take 2 mg by mouth daily as needed for constipation.   06/25/2018 at Unknown time  . loratadine (CLARITIN) 10 MG tablet Take 10 mg by mouth daily.   06/25/2018 at Unknown time  . metoprolol succinate (TOPROL-XL) 100 MG 24 hr tablet Take 1 tablet (100 mg total) by mouth 2 (two) times daily. Take with or immediately following a meal. 60 tablet 3 06/25/2018 at 0800pm  . Multiple Vitamin (MULTIVITAMIN WITH MINERALS) TABS tablet Take 1 tablet by mouth daily.   06/25/2018 at Unknown time  . ondansetron (ZOFRAN) 4 MG tablet Take 1 tablet (4 mg total) by  mouth every 8 (eight) hours as needed for nausea or vomiting. 30 tablet 1 06/25/2018 at Unknown time  . pantoprazole (PROTONIX) 40 MG tablet Take 1 tablet (40 mg total) by mouth daily.   06/25/2018 at Unknown time  . potassium chloride SA (KLOR-CON M20) 20 MEQ tablet Take 1 tablet (20 mEq total) by mouth daily. 90 tablet 0 06/25/2018 at Unknown time  . vitamin C (ASCORBIC ACID) 500 MG tablet Take 500 mg by mouth 2 (two) times daily.   06/25/2018 at Unknown time   Scheduled: . allopurinol  300 mg Oral Daily  . atorvastatin  20 mg Oral QHS  . enoxaparin (LOVENOX) injection  30 mg Subcutaneous Q24H  . fluticasone  2 spray Each Nare Daily  . insulin aspart  0-15 Units Subcutaneous TID WC  . insulin aspart  0-5 Units Subcutaneous QHS  . insulin aspart  6 Units Subcutaneous TID WC  . insulin glargine  20 Units Subcutaneous BID  . isosorbide mononitrate  30 mg Oral Daily  . linaclotide  290 mcg Oral Q breakfast  . loratadine  10 mg Oral Daily  . metoprolol succinate  100 mg Oral BID  . pantoprazole  40 mg Oral Daily  . senna-docusate  1 tablet Oral BID   Continuous: . ceFEPime (MAXIPIME) IV Stopped (06/27/18 1447)  . metronidazole 100 mL/hr at 06/28/18 3220   URK:[id number] **OR** acetaminophen, albuterol, ondansetron **OR** ondansetron (ZOFRAN) IV, traZODone  Assesment: He was admitted with severe sepsis.  He is thought to have a urinary tract infection but unfortunately a urine culture was not obtained before he was started on antibiotics.  He is improved.  Sepsis pathophysiology seems to have resolved.  Blood testing this morning is pending.  He is known to have significant systolic congestive heart failure from nonischemic cardiomyopathy.  He has an implantable cardioverter defibrillator in place but it has not fired  He had acute urinary retention and some hydronephrosis likely from urinary retention.  He had proctocolitis with a lot of constipation but his bowels have moved multiple  times since admission  He has been hypokalemic and potassium level is pending  He has general debility and still has difficulty walking at home after a severe life-threatening medical illness  He has acute on chronic renal failure with lab work pending  He has diabetes and is on sliding scale. Principal Problem:   Severe sepsis ([place])  Active Problems:   GERD (gastroesophageal reflux disease)   Constipation   Sleep apnea   Systolic CHF (HCC)   Debility   Uncontrolled type 2 diabetes mellitus with complication (HCC)   Diarrhea   Obstipation   Acute urinary retention   Mild bilateral Hydronephrosis   CKD (chronic kidney disease) stage 3, GFR 30-59 ml/min (HCC)   NICM (nonischemic cardiomyopathy) ([place])   ICD (implantable cardioverter-defibrillator) in place   Hypokalemia   Hyperglycemia   Proctocolitis   UTI (urinary tract infection)   Pressure injury of skin    Plan: Continue treatments.  I think he is okay to move out of the ICU now.    LOS: 2 days   Alonza Bogus 06/28/2018, 8:50 AM

## 2018-06-29 LAB — GLUCOSE, CAPILLARY
Glucose-Capillary: 245 mg/dL — ABNORMAL HIGH (ref 70–99)
Glucose-Capillary: 122 mg/dL — ABNORMAL HIGH (ref 70–99)
Glucose-Capillary: 130 mg/dL — ABNORMAL HIGH (ref 70–99)
Glucose-Capillary: 190 mg/dL — ABNORMAL HIGH (ref 70–99)
Glucose-Capillary: 168 mg/dL — ABNORMAL HIGH (ref 70–99)
Glucose-Capillary: 99 mg/dL (ref 70–99)

## 2018-06-29 LAB — BASIC METABOLIC PANEL
Anion gap: 7 (ref 5–15)
BUN: 37 mg/dL — ABNORMAL HIGH (ref 8–23)
CO2: 24 mmol/L (ref 22–32)
Calcium: 8.7 mg/dL — ABNORMAL LOW (ref 8.9–10.3)
Chloride: 108 mmol/L (ref 98–111)
Creatinine, Ser: 2.04 mg/dL — ABNORMAL HIGH (ref 0.61–1.24)
GFR calc Af Amer: 38 mL/min — ABNORMAL LOW (ref >60)
GFR calc non Af Amer: 33 mL/min — ABNORMAL LOW (ref >60)
Glucose, Bld: 128 mg/dL — ABNORMAL HIGH (ref 70–99)
Potassium: 3.4 mmol/L — ABNORMAL LOW (ref 3.5–5.1)
Sodium: 139 mmol/L (ref 135–145)

## 2018-06-29 MED ORDER — SENNOSIDES-DOCUSATE SODIUM 8.6-50 MG PO TABS: 1.0000 | ORAL_TABLET | Freq: Every day | ORAL | Status: DC

## 2018-06-29 MED ORDER — SENNOSIDES-DOCUSATE SODIUM 8.6-50 MG PO TABS: 1.0000 | ORAL_TABLET | Freq: Every day | ORAL | Status: DC | PRN

## 2018-06-29 NOTE — Care Management Important Message (Signed)
Important Message  Patient Details  Name: KALMEN LOLLAR MRN: [id number] Date of Birth: 08-25-1950   Medicare Important Message Given:  Yes    Tommy Medal 06/29/2018, 1:49 PM

## 2018-06-29 NOTE — Progress Notes (Signed)
Pharmacy Antibiotic Note  Today is day #4 of cefepime and day #4 of metronidazole therapy for this 68 yo male with urosepsis. Blood and stool cultures have shown no growth.  Patient's sepsis is resolving. Renal function is unstable.  Plan: Continue cefepime 2gm IV q24h F/U cxs and clinical progress Monitor V/S and labs   Height: 5\' 6"  (167.6 cm) Weight: 251 lb 15.8 oz (114.3 kg) IBW/kg (Calculated) : 63.8  Temp (24hrs), Avg:99 F (37.2 C), Min:98.8 F (37.1 C), Max:99.1 F (37.3 C)  Recent Labs  Lab 06/26/18 1237 06/26/18 1435 06/26/18 1956 06/26/18 2257 06/27/18 0432 06/28/18 0934 06/29/18 0422  WBC 9.9  --   --   --  6.6  --   --   CREATININE 3.09*  --   --   --  2.59* 2.34* 2.04*  LATICACIDVEN 5.5* 6.5* 5.0* 3.4* 2.5* 2.0*  --     Estimated Creatinine Clearance: 42.3 mL/min (A) (by C-G formula based on SCr of 2.04 mg/dL (H)).   Normalized CrCl is 63mls/min  No Known Allergies  Antimicrobials this admission: vancomycin 4/24 >> 4/26 cefepime 4/24 >>  metronidazole 4/24   Microbiology results: 4/24 BC x2: NG x3 days  4/24 MRSA PCR: negative 4/24 C. Diff: neg x3  Thank you for allowing pharmacy to be a part of this patient's care.  Isac Sarna, BS [place], [place] Clinical Pharmacist Pager [phone] 06/29/2018 11:32 AM

## 2018-06-29 NOTE — Evaluation (Signed)
Physical Therapy Evaluation Patient Details Name: Frank Hoover MRN: [id number] DOB: [birth year] Today's Date: 06/29/2018   History of Present Illness  Frank Hoover is a 68 y.o. male with NICM s/p ICD placement, systolic CHF (EF 52%), stage 3 CKD, Type 2 DM, HTN, HLD and other medical problems detailed below presents to ED complaining of generalized weakness and diarrhea.  He says that he has had a difficult time urinating since yesterday evening.  He has had some low-grade fever at home.  He denies cough shortness of breath runny nose fever and sore throat.  Denies emesis.  He denies having chest pain.  He was brought to the emergency department by EMS.  He has a history of chronic constipation.    Clinical Impression  Patient limited for functional mobility as stated below secondary to BLE weakness, fatigue and fair/poor standing balance.  Patient limited to ambulation to doorway and back secondary to c/o fatigue, requires repeated verbal cueing for safety and proper hand placement during transfers and tolerated sitting up in chair after therapy.  Patient will benefit from continued physical therapy in hospital and recommended venue below to increase strength, balance, endurance for safe ADLs and gait.     Follow Up Recommendations SNF;Supervision for mobility/OOB;Supervision - Intermittent    Equipment Recommendations  None recommended by PT    Recommendations for Other Services       Precautions / Restrictions Precautions Precautions: Fall Restrictions Weight Bearing Restrictions: No      Mobility  Bed Mobility Overal bed mobility: Needs Assistance Bed Mobility: Supine to Sit     Supine to sit: Min assist;Mod assist     General bed mobility comments: slow labored movement  Transfers Overall transfer level: Needs assistance Equipment used: Rolling walker (2 wheeled) Transfers: Sit to/from [facility] Sit to Stand: Min assist Stand pivot transfers:  Min assist       General transfer comment: slow labored movement  Ambulation/Gait Ambulation/Gait assistance: Min assist;Mod assist Gait Distance (Feet): 22 Feet Assistive device: Rolling walker (2 wheeled) Gait Pattern/deviations: Decreased step length - right;Decreased step length - left;Decreased stride length Gait velocity: slow   General Gait Details: slow labored unsteady cadence with occasional dragging of feet, limited secondary to c/o fatigue  Stairs            Wheelchair Mobility    Modified Rankin (Stroke Patients Only)       Balance Overall balance assessment: Needs assistance Sitting-balance support: Feet supported;No upper extremity supported Sitting balance-Leahy Scale: Fair     Standing balance support: Bilateral upper extremity supported;During functional activity Standing balance-Leahy Scale: Fair Standing balance comment: using RW                             Pertinent Vitals/Pain Pain Assessment: Faces Faces Pain Scale: Hurts a little bit Pain Location: stomach Pain Descriptors / Indicators: Discomfort;Aching Pain Intervention(s): Limited activity within patient's tolerance;Monitored during session    Home Living Family/patient expects to be discharged to:: Private residence Living Arrangements: Spouse/significant other Available Help at Discharge: Family;Available 24 hours/day Type of Home: Apartment Home Access: Level entry     Home Layout: One level Home Equipment: Walker - 4 wheels;Wheelchair - [employer];Hospital bed      Prior Function Level of Independence: Independent with assistive device(s)         Comments: household ambulator with [place]  Extremity/Trunk Assessment   Upper Extremity Assessment Upper Extremity Assessment: Generalized weakness    Lower Extremity Assessment Lower Extremity Assessment: Generalized weakness    Cervical / Trunk  Assessment Cervical / Trunk Assessment: Normal  Communication   Communication: No difficulties  Cognition Arousal/Alertness: Awake/alert Behavior During Therapy: WFL for tasks assessed/performed Overall Cognitive Status: Within Functional Limits for tasks assessed                                        General Comments      Exercises     Assessment/Plan    PT Assessment Patient needs continued PT services  PT Problem List Decreased strength;Decreased activity tolerance;Decreased balance;Decreased mobility       PT Treatment Interventions Therapeutic exercise;Gait training;Stair training;Functional mobility training;Therapeutic activities;Patient/family education    PT Goals (Current goals can be found in the Care Plan section)  Acute Rehab PT Goals Patient Stated Goal: return home with family to assist PT Goal Formulation: With patient Time For Goal Achievement: 07/13/18 Potential to Achieve Goals: Good    Frequency Min 3X/week   Barriers to discharge        Co-evaluation               AM-PAC PT "6 Clicks" Mobility  Outcome Measure Help needed turning from your back to your side while in a flat bed without using bedrails?: A Little Help needed moving from lying on your back to sitting on the side of a flat bed without using bedrails?: A Lot Help needed moving to and from a bed to a chair (including a wheelchair)?: A Little Help needed standing up from a chair using your arms (e.g., wheelchair or bedside chair)?: A Little Help needed to walk in hospital room?: A Lot Help needed climbing 3-5 steps with a railing? : A Lot 6 Click Score: 15    End of Session Equipment Utilized During Treatment: Gait belt Activity Tolerance: Patient tolerated treatment well;Patient limited by fatigue Patient left: in chair;with call bell/phone within reach Nurse Communication: Mobility status PT Visit Diagnosis: Unsteadiness on feet (R26.81);Other abnormalities  of gait and mobility (R26.89);Muscle weakness (generalized) (M62.81)    Time: 6734-1937 PT Time Calculation (min) (ACUTE ONLY): 29 min   Charges:   PT Evaluation $PT Eval Moderate Complexity: 1 Mod PT Treatments $Therapeutic Activity: 23-37 mins        1:25 PM, 06/29/18 Lonell Grandchild, MPT Physical Therapist with [facility] [phone] office [phone] mobile phone

## 2018-06-29 NOTE — Progress Notes (Addendum)
REVIEWED. D/C SENOKOT. CONTINUE LINZESS. MONITOR STOOLS.   Subjective: Multiple loose stools per nursing staff. Patient feels less bloated and distended. No abdominal pain. No overt GI bleeding. No N/V.   Objective: Vital signs in last 24 hours: Temp:  [98.8 F (37.1 C)-99.3 F (37.4 C)] 99 F (37.2 C) (04/27 0529) Pulse Rate:  [64-83] 64 (04/27 0529) Resp:  [14-20] 20 (04/27 0529) BP: (116-135)/(58-75) 128/75 (04/27 0529) SpO2:  [97 %-100 %] 98 % (04/27 0529) Last BM Date: 06/29/18 General:   Alert and oriented, pleasant Abdomen:  Bowel sounds present, distended but soft, no TTP, no rebound or guarding Neurologic:  Alert and  oriented x4  Intake/Output from previous day: 04/26 0701 - 04/27 0700 In: 326.9 [P.O.:120; IV Piggyback:206.9] Out: 500 [Urine:500] Intake/Output this shift: No intake/output data recorded.  Lab Results: Recent Labs    06/26/18 1237 06/27/18 0432  WBC 9.9 6.6  HGB 14.0 12.0*  HCT 40.8 36.0*  PLT 199 171   BMET Recent Labs    06/27/18 0432 06/28/18 0934 06/29/18 0422  NA 137 136 139  K 3.3* 3.3* 3.4*  CL 102 102 108  CO2 25 24 24   GLUCOSE 177* 240* 128*  BUN 40* 38* 37*  CREATININE 2.59* 2.34* 2.04*  CALCIUM 8.6* 8.9 8.7*   LFT Recent Labs    06/26/18 1237 06/27/18 0432  PROT 7.3 6.1*  ALBUMIN 4.3 3.6  AST 30 32  ALT <5 17  ALKPHOS 53 40  BILITOT 0.9 0.9     Studies/Results: US Renal  Result Date: 06/27/2018 CLINICAL DATA:  Bilateral hydronephrosis. EXAM: RENAL / URINARY TRACT ULTRASOUND COMPLETE COMPARISON:  CT scan June 26, 2018 FINDINGS: Right Kidney: Renal measurements: 9.0 x 4.6 x 4.9 cm = volume: 106.5 mL. Mild hydronephrosis. No other abnormalities. Left Kidney: Renal measurements: 10.6 x 4.7 x 5.4 cm = volume: 142.6 mL. No hydronephrosis seen on limited views. Bladder: Decompressed with a Foley catheter not well evaluated. IMPRESSION: 1. Mild hydronephrosis remains on the right, unchanged since yesterday's CT scan.  No hydronephrosis seen on limited views of the left kidney. The bladder is decompressed with a Foley catheter are not well evaluated. Electronically Signed   By: Dorise Bullion III M.D   On: 06/27/2018 12:01   Dg Chest Port 1 View  Result Date: 06/28/2018 CLINICAL DATA:  CHF. EXAM: PORTABLE CHEST 1 VIEW COMPARISON:  June 27, 2018 FINDINGS: The heart size and mediastinal contours are within normal limits. Both lungs are clear. The visualized skeletal structures are unremarkable. IMPRESSION: No active disease. Electronically Signed   By: Dorise Bullion III M.D   On: 06/28/2018 08:32   Portable Chest 1 View  Result Date: 06/27/2018 CLINICAL DATA:  Severe sepsis. EXAM: PORTABLE CHEST 1 VIEW COMPARISON:  June 12, 2016 FINDINGS: Stable AICD device. No pneumothorax. No change in the cardiomediastinal silhouette. Mild increased thickening of the right fissure. No focal infiltrate in the lungs. IMPRESSION: Mild increased thickening of the right fissure could represent pleural thickening versus a tiny amount of fluid in the fissure. No acute infiltrate. No cause for sepsis noted. Electronically Signed   By: Dorise Bullion III M.D   On: 06/27/2018 10:19    Assessment: 68 year old male admitted with urosepsis, obstipation.  CT without contrast noting chronic distal proctocolitis in setting of fecal impaction, likely stercoral etiology. Large amount of multiple episodes of loose stool yesterday per nursing staff. Clinically, patient notes less abdominal distension and feels improved with aggressive bowel regimen. Nursing staff discussed  with GI and Dr. Luan Pulling. Linzess and Senna on hold now per Dr. Luan Pulling. Reassess in morning and may need to titrate Linzess dosing.   Plan: Will reassess in the morning Continue PPI    Annitta Needs, PhD, ANP-BC [facility]    LOS: 3 days    06/29/2018, 8:17 AM

## 2018-06-29 NOTE — Plan of Care (Signed)
  Problem: Acute Rehab PT Goals(only PT should resolve) Goal: Pt Will Go Supine/Side To Sit 06/29/2018 1328 by Lonell Grandchild, PT Outcome: Progressing Flowsheets (Taken 06/29/2018 1328) Pt will go Supine/Side to Sit: with min guard assist 06/29/2018 1327 by Lonell Grandchild, PT Outcome: Progressing Flowsheets (Taken 06/29/2018 1327) Pt will go Supine/Side to Sit: with min guard assist Goal: Patient Will Transfer Sit To/From Stand 06/29/2018 1328 by Lonell Grandchild, PT Outcome: Progressing Flowsheets (Taken 06/29/2018 1328) Patient will transfer sit to/from stand: with min guard assist 06/29/2018 1327 by Lonell Grandchild, PT Outcome: Progressing Goal: Pt Will Transfer Bed To Chair/Chair To Bed 06/29/2018 1328 by Lonell Grandchild, PT Outcome: Progressing Flowsheets (Taken 06/29/2018 1328) Pt will Transfer Bed to Chair/Chair to Bed: min guard assist 06/29/2018 1327 by Lonell Grandchild, PT Outcome: Progressing Goal: Pt Will Ambulate 06/29/2018 1328 by Lonell Grandchild, PT Outcome: Progressing Flowsheets (Taken 06/29/2018 1328) Pt will Ambulate: 50 feet; with min guard assist; with rolling walker 06/29/2018 1327 by Lonell Grandchild, PT Outcome: Progressing   1:28 PM, 06/29/18 Lonell Grandchild, MPT Physical Therapist with [facility] [phone] office [phone] mobile phone

## 2018-06-29 NOTE — Progress Notes (Addendum)
Subjective: He says he feels much better.  He has no new complaints.  He is still having soft stools.  Sepsis pathophysiology has resolved.  Objective: Vital signs in last 24 hours: Temp:  [98.8 F (37.1 C)-99.3 F (37.4 C)] 99 F (37.2 C) (04/27 0529) Pulse Rate:  [64-83] 64 (04/27 0529) Resp:  [14-20] 20 (04/27 0529) BP: (116-135)/(58-75) 128/75 (04/27 0529) SpO2:  [97 %-100 %] 98 % (04/27 0529) Weight change:  Last BM Date: 06/29/18  Intake/Output from previous day: 04/26 0701 - 04/27 0700 In: 326.9 [P.O.:120; IV Piggyback:206.9] Out: 500 [Urine:500]  PHYSICAL EXAM General appearance: alert, cooperative and no distress Resp: clear to auscultation bilaterally Cardio: regular rate and rhythm, S1, S2 normal, no murmur, click, rub or gallop GI: soft, non-tender; bowel sounds normal; no masses,  no organomegaly Extremities: extremities normal, atraumatic, no cyanosis or edema  Lab Results:  Results for orders placed or performed during the hospital encounter of 06/26/18 (from the past 48 hour(s))  Glucose, capillary     Status: Abnormal   Collection Time: 06/27/18 11:54 AM  Result Value Ref Range   Glucose-Capillary 202 (H) 70 - 99 mg/dL  Glucose, capillary     Status: Abnormal   Collection Time: 06/27/18  4:16 PM  Result Value Ref Range   Glucose-Capillary 154 (H) 70 - 99 mg/dL  Glucose, capillary     Status: Abnormal   Collection Time: 06/27/18 10:13 PM  Result Value Ref Range   Glucose-Capillary 150 (H) 70 - 99 mg/dL  Glucose, capillary     Status: Abnormal   Collection Time: 06/28/18  3:25 AM  Result Value Ref Range   Glucose-Capillary 173 (H) 70 - 99 mg/dL   Comment 1 Notify RN    Comment 2 Document in Chart   Glucose, capillary     Status: Abnormal   Collection Time: 06/28/18  7:45 AM  Result Value Ref Range   Glucose-Capillary 173 (H) 70 - 99 mg/dL  Basic metabolic panel     Status: Abnormal   Collection Time: 06/28/18  9:34 AM  Result Value Ref Range    Sodium 136 135 - 145 mmol/L   Potassium 3.3 (L) 3.5 - 5.1 mmol/L   Chloride 102 98 - 111 mmol/L   CO2 24 22 - 32 mmol/L   Glucose, Bld 240 (H) 70 - 99 mg/dL   BUN 38 (H) 8 - 23 mg/dL   Creatinine, Ser 2.34 (H) 0.61 - 1.24 mg/dL   Calcium 8.9 8.9 - 10.3 mg/dL   GFR calc non Af Amer 28 (L) >60 mL/min   GFR calc Af Amer 32 (L) >60 mL/min   Anion gap 10 5 - 15    Comment: Performed at [facility], [address] [place], [place] [address]  Lactic acid, plasma     Status: Abnormal   Collection Time: 06/28/18  9:34 AM  Result Value Ref Range   Lactic Acid, Venous 2.0 (HH) 0.5 - 1.9 mmol/L    Comment: CRITICAL RESULT CALLED TO, READ BACK BY AND VERIFIED WITH: MARTIN,L AT 1117 ON 06/28/2018 BY JPM Performed at [facility], [address] [place], [place] [address]   Glucose, capillary     Status: Abnormal   Collection Time: 06/28/18  4:12 PM  Result Value Ref Range   Glucose-Capillary 215 (H) 70 - 99 mg/dL  Glucose, capillary     Status: Abnormal   Collection Time: 06/28/18  9:04 PM  Result Value Ref Range   Glucose-Capillary 145 (H)  70 - 99 mg/dL  Glucose, capillary     Status: Abnormal   Collection Time: 06/29/18  2:58 AM  Result Value Ref Range   Glucose-Capillary 122 (H) 70 - 99 mg/dL  Basic metabolic panel     Status: Abnormal   Collection Time: 06/29/18  4:22 AM  Result Value Ref Range   Sodium 139 135 - 145 mmol/L   Potassium 3.4 (L) 3.5 - 5.1 mmol/L   Chloride 108 98 - 111 mmol/L   CO2 24 22 - 32 mmol/L   Glucose, Bld 128 (H) 70 - 99 mg/dL   BUN 37 (H) 8 - 23 mg/dL   Creatinine, Ser 2.04 (H) 0.61 - 1.24 mg/dL   Calcium 8.7 (L) 8.9 - 10.3 mg/dL   GFR calc non Af Amer 33 (L) >60 mL/min   GFR calc Af Amer 38 (L) >60 mL/min   Anion gap 7 5 - 15    Comment: Performed at [facility], [address], [place], [place] [address]  Glucose, capillary     Status: Abnormal   Collection Time: 06/29/18  7:33 AM  Result Value Ref Range   Glucose-Capillary 130 (H) 70 - 99 mg/dL     ABGS No results for input(s): PHART, PO2ART, TCO2, HCO3 in the last 72 hours.  Invalid input(s): PCO2 CULTURES Recent Results (from the past 240 hour(s))  Blood Culture (routine x 2)     Status: None (Preliminary result)   Collection Time: 06/26/18  2:29 PM  Result Value Ref Range Status   Specimen Description   Final    BLOOD RIGHT ARM BOTTLES DRAWN AEROBIC AND ANAEROBIC   Special Requests Blood Culture adequate volume  Final   Culture   Final    NO GROWTH 2 DAYS Performed at [facility], [address], [place], [place] [address]    Report Status PENDING  Incomplete  Blood Culture (routine x 2)     Status: None (Preliminary result)   Collection Time: 06/26/18  2:36 PM  Result Value Ref Range Status   Specimen Description   Final    BLOOD RIGHT ARM BOTTLES DRAWN AEROBIC AND ANAEROBIC   Special Requests Blood Culture adequate volume  Final   Culture   Final    NO GROWTH 2 DAYS Performed at [facility], [address] [place], [place] [address]    Report Status PENDING  Incomplete  MRSA PCR Screening     Status: None   Collection Time: 06/26/18  5:02 PM  Result Value Ref Range Status   MRSA by PCR NEGATIVE NEGATIVE Final    Comment:        The GeneXpert MRSA Assay (FDA approved for NASAL specimens only), is one component of a comprehensive MRSA colonization surveillance program. It is not intended to diagnose MRSA infection nor to guide or monitor treatment for MRSA infections. Performed at [facility], [address] [place], [place] [address]   C difficile quick scan w PCR reflex     Status: None   Collection Time: 06/26/18  6:44 PM  Result Value Ref Range Status   C Diff antigen NEGATIVE NEGATIVE Final   C Diff toxin NEGATIVE NEGATIVE Final   C Diff interpretation No C. difficile detected.  Final    Comment: Performed at [facility], [address] [place], [place] [address]   Studies/Results: US Renal  Result Date: 06/27/2018 CLINICAL DATA:   Bilateral hydronephrosis. EXAM: RENAL / URINARY TRACT ULTRASOUND COMPLETE COMPARISON:  CT scan June 26, 2018 FINDINGS: Right Kidney: Renal measurements: 9.0 x 4.6 x 4.9 cm = volume: 106.5 mL. Mild hydronephrosis. No other abnormalities. Left Kidney: Renal measurements: 10.6 x 4.7 x 5.4 cm = volume: 142.6 mL. No hydronephrosis seen on limited views. Bladder: Decompressed with a Foley catheter not well evaluated. IMPRESSION: 1. Mild hydronephrosis remains on the right, unchanged since yesterday's CT scan. No hydronephrosis seen on limited views of the left kidney. The bladder is decompressed with a Foley catheter are not well evaluated. Electronically Signed   By: Dorise Bullion III M.D   On: 06/27/2018 12:01   Dg Chest Port 1 View  Result Date: 06/28/2018 CLINICAL DATA:  CHF. EXAM: PORTABLE CHEST 1 VIEW COMPARISON:  June 27, 2018 FINDINGS: The heart size and mediastinal contours are within normal limits. Both lungs are clear. The visualized skeletal structures are unremarkable. IMPRESSION: No active disease. Electronically Signed   By: Dorise Bullion III M.D   On: 06/28/2018 08:32   Portable Chest 1 View  Result Date: 06/27/2018 CLINICAL DATA:  Severe sepsis. EXAM: PORTABLE CHEST 1 VIEW COMPARISON:  June 12, 2016 FINDINGS: Stable AICD device. No pneumothorax. No change in the cardiomediastinal silhouette. Mild increased thickening of the right fissure. No focal infiltrate in the lungs. IMPRESSION: Mild increased thickening of the right fissure could represent pleural thickening versus a tiny amount of fluid in the fissure. No acute infiltrate. No cause for sepsis noted. Electronically Signed   By: Dorise Bullion III M.D   On: 06/27/2018 10:19    Medications:  Prior to Admission:  Medications Prior to Admission  Medication Sig Dispense Refill Last Dose  . acetaminophen (TYLENOL) 500 MG tablet Take 1,000 mg by mouth daily as needed for moderate pain or headache.    06/25/2018 at Unknown time  .  albuterol (PROAIR HFA) 108 (90 BASE) MCG/ACT inhaler Inhale 2 puffs into the lungs every 6 (six) hours as needed for wheezing or shortness of breath.    >30  . allopurinol (ZYLOPRIM) 300 MG tablet Take 300 mg by mouth daily.     06/25/2018 at Unknown time  . alum & mag hydroxide-simeth (MAALOX/MYLANTA) 200-200-20 MG/5ML suspension Take 15 mLs by mouth every 6 (six) hours as needed for indigestion or heartburn.   06/25/2018 at Unknown time  . atorvastatin (LIPITOR) 20 MG tablet Take 20 mg by mouth at bedtime.   06/25/2018 at Unknown time  . cholecalciferol (VITAMIN D) 1000 units tablet Take 1,000 Units by mouth daily.   06/25/2018 at Unknown time  . fluticasone (FLONASE) 50 MCG/ACT nasal spray Place 2 sprays into both nostrils daily.    06/25/2018 at Unknown time  . furosemide (LASIX) 40 MG tablet Take 1 tablet (40 mg total) by mouth every morning. & 20 mg in the evening (Patient taking differently: Take 40 mg by mouth 2 (two) times daily. ) 135 tablet 1 06/25/2018 at Unknown time  . gabapentin (NEURONTIN) 400 MG capsule Take 400 mg by mouth daily.    06/25/2018 at Unknown time  . hydrALAZINE (APRESOLINE) 25 MG tablet Take 75 mg by mouth 3 (three) times daily.   unknown  . insulin lispro (HUMALOG KWIKPEN) 100 UNIT/ML KiwkPen Inject 18-20 Units into the skin as directed. Inject 2 to 18 units based on sliding scale as directed as needed for blood glucose over 200   06/25/2018 at 0900am  . isosorbide mononitrate (IMDUR) 30 MG 24 hr tablet Take 1 tablet (30 mg total) by mouth daily. 90 tablet 0 06/25/2018 at Unknown  time  . LANTUS SOLOSTAR 100 UNIT/ML Solostar Pen Inject 20 Units into the skin 2 (two) times daily.    06/25/2018 at 0900pm  . LINZESS 290 MCG CAPS capsule TAKE 1 CAPSULE ONCE DAILY 30 MINUTES BEFORE BREAKFAST (Patient taking differently: Take 290 mcg by mouth daily before breakfast. ) 90 capsule 3 06/25/2018 at Unknown time  . loperamide (IMODIUM) 2 MG capsule Take 2 mg by mouth daily as needed for  constipation.   06/25/2018 at Unknown time  . loratadine (CLARITIN) 10 MG tablet Take 10 mg by mouth daily.   06/25/2018 at Unknown time  . metoprolol succinate (TOPROL-XL) 100 MG 24 hr tablet Take 1 tablet (100 mg total) by mouth 2 (two) times daily. Take with or immediately following a meal. 60 tablet 3 06/25/2018 at 0800pm  . Multiple Vitamin (MULTIVITAMIN WITH MINERALS) TABS tablet Take 1 tablet by mouth daily.   06/25/2018 at Unknown time  . ondansetron (ZOFRAN) 4 MG tablet Take 1 tablet (4 mg total) by mouth every 8 (eight) hours as needed for nausea or vomiting. 30 tablet 1 06/25/2018 at Unknown time  . pantoprazole (PROTONIX) 40 MG tablet Take 1 tablet (40 mg total) by mouth daily.   06/25/2018 at Unknown time  . potassium chloride SA (KLOR-CON M20) 20 MEQ tablet Take 1 tablet (20 mEq total) by mouth daily. 90 tablet 0 06/25/2018 at Unknown time  . vitamin C (ASCORBIC ACID) 500 MG tablet Take 500 mg by mouth 2 (two) times daily.   06/25/2018 at Unknown time   Scheduled: . allopurinol  300 mg Oral Daily  . atorvastatin  20 mg Oral QHS  . enoxaparin (LOVENOX) injection  30 mg Subcutaneous Q24H  . fluticasone  2 spray Each Nare Daily  . insulin aspart  0-15 Units Subcutaneous TID WC  . insulin aspart  0-5 Units Subcutaneous QHS  . insulin aspart  6 Units Subcutaneous TID WC  . insulin glargine  20 Units Subcutaneous BID  . isosorbide mononitrate  30 mg Oral Daily  . linaclotide  290 mcg Oral Q breakfast  . loratadine  10 mg Oral Daily  . metoprolol succinate  100 mg Oral BID  . pantoprazole  40 mg Oral Daily  . potassium chloride  20 mEq Oral BID  . senna-docusate  1 tablet Oral Q supper   Continuous: . ceFEPime (MAXIPIME) IV 2 g (06/28/18 1435)  . metronidazole 500 mg (06/28/18 2242)   ZHY:[id number] **OR** acetaminophen, albuterol, ondansetron **OR** ondansetron (ZOFRAN) IV, traZODone  Assesment: He was admitted with severe sepsis.  This appears to be related to a urinary tract  infection and proctocolitis.  Unfortunately he did not have a urine culture done.  Blood cultures are thus far negative.  He had proctocolitis and severe constipation.  He has responded to treatment and is having soft stools now.  He had acute urinary retention and had some hydronephrosis.  He has a Foley catheter in place and now that sepsis pathophysiology has resolved I am going to see if we can remove that.  He will need follow-up imaging to see if hydronephrosis has resolved.  He has acute on chronic renal failure and his renal function is improving.  He has hypokalemia and he is still somewhat low but is on potassium replacement  He has severe systolic heart failure and he is euvolemic now.  He has a defibrillator which has not fired  He has diabetes with renal dysfunction from his diabetes  He has hypertension which is  been well controlled   Principal Problem:   Severe sepsis ([place]) Active Problems:   GERD (gastroesophageal reflux disease)   Constipation   Sleep apnea   Systolic CHF (HCC)   Debility   Uncontrolled type 2 diabetes mellitus with complication (HCC)   Diarrhea   Obstipation   Acute urinary retention   Mild bilateral Hydronephrosis   CKD (chronic kidney disease) stage 3, GFR 30-59 ml/min (HCC)   NICM (nonischemic cardiomyopathy) ([place])   ICD (implantable cardioverter-defibrillator) in place   Hypokalemia   Hyperglycemia   Proctocolitis   UTI (urinary tract infection)   Pressure injury of skin    Plan: Continue current treatments.  Remove Foley and see if he can urinate spontaneously.  If not he will require urology consultation.  PT consultation for debility  Addendum: When I went to discuss removing the Foley catheter with his nurse she told me that his history of soft stools was inaccurate and is actually having large volume loose stools.  We will hold Linzess and Senokot S for now.  LOS: 3 days   Frank Hoover 06/29/2018, 8:24 AM

## 2018-06-29 NOTE — TOC Initial Note (Signed)
Transition of Care [facility]) - Initial/Assessment Note    Patient Details  Name: Frank Hoover MRN: [id number] Date of Birth: 1950-12-16  [facility] CM/SW Contact:    Boneta Lucks, RN Phone Number: 06/29/2018, 1:43 PM  Clinical Narrative:  Patient admitted with Sepsis, Pt is sitting up in a chair eating lunch, alert and oriented. Discussed with Patient that PT recommended SNF. Patient refuses, States he has wife, adult daughter at home to assist him.  Patient also has  Aide 2-3 hours per day/7 days a week. Pt gave CM permission to talk with his spouse.  Per Wife they Hoover care for him at home, she agrees she has assistance of daughter, aide as well as her sister. Wife state he need a new walker, referral placed.                                Expected Discharge Plan: [place]     Patient Goals and CMS Choice Patient states their goals for this hospitalization and ongoing recovery are:: Pt wishes to return home with contiunued Family and CNA support CMS Medicare.gov Compare Post Acute Care list provided to:: Patient Choice offered to / list presented to : Patient  Expected Discharge Plan and Services Expected Discharge Plan: [place]   Discharge Planning Services: CM Consult Post Acute Care Choice: Durable Medical Equipment, Home Health Living arrangements for the past 2 months: Single Family Home                 DME Arranged: Walker rolling DME Agency: [employer] Date DME Agency Contacted: 06/29/18 Time DME Agency Contacted: [phone] Representative spoke with at DME Agency: Blake Divine [place] Arranged: PT, Refused SNF [place] Agency: [place] Date [place]: 06/29/18 Time [place]: [place] Representative spoke with at [place]: [place]  Prior Living Arrangements/Services Living arrangements for the past 2 months: Single Family Home Lives with:: Adult Children, Spouse Patient language  and need for interpreter reviewed:: No Do you feel safe going back to the place where you live?: Yes      Need for Family Participation in Patient Care: Yes (Comment) Care giver support system in place?: Yes (comment) Current home services: DME, Other (comment)(AIDE/7 days/week) Criminal Activity/Legal Involvement Pertinent to Current Situation/Hospitalization: No - Comment as needed  Activities of Daily Living Home Assistive Devices/Equipment: Walker (specify type), CBG Meter ADL Screening (condition at time of admission) Patient's cognitive ability adequate to safely complete daily activities?: Yes Is the patient deaf or have difficulty hearing?: No Does the patient have difficulty seeing, even when wearing glasses/contacts?: No Does the patient have difficulty concentrating, remembering, or making decisions?: No Patient able to express need for assistance with ADLs?: Yes Does the patient have difficulty dressing or bathing?: No Independently performs ADLs?: Yes (appropriate for developmental age) Does the patient have difficulty walking or climbing stairs?: No Weakness of Legs: None Weakness of Arms/Hands: None  Permission Sought/Granted Permission sought to share information with : Case Manager Permission granted to share information with : Yes, Verbal Permission Granted     Permission granted to share info w AGENCY: Tyson Babinski, [place] and Blake Divine, [place] granted to share info w Relationship: wife     Emotional Assessment Appearance:: Appears stated age   Affect (typically observed): Calm, Pleasant Orientation: : Oriented to Self, Oriented to  Time,  Oriented to Place, Oriented to Situation Alcohol / Substance Use: Not Applicable Psych Involvement: No (comment)  Admission diagnosis:  Lactic acidosis [E87.2] Urinary retention [R33.9] Lower abdominal pain [R10.30] Patient Active Problem List   Diagnosis Date Noted  . Severe sepsis ([place])  06/26/2018  . Obstipation 06/26/2018  . Acute urinary retention 06/26/2018  . Mild bilateral Hydronephrosis 06/26/2018  . CKD (chronic kidney disease) stage 3, GFR 30-59 ml/min (HCC) 06/26/2018  . NICM (nonischemic cardiomyopathy) ([place]) 06/26/2018  . ICD (implantable cardioverter-defibrillator) in place 06/26/2018  . Hypokalemia 06/26/2018  . Hyperglycemia 06/26/2018  . Proctocolitis 06/26/2018  . UTI (urinary tract infection) 06/26/2018  . Pressure injury of skin 06/26/2018  . Chronic systolic CHF (congestive heart failure) ([place]) 06/11/2016  . Diarrhea 04/18/2016  . Anemia 10/17/2015  . Nausea and vomiting 10/17/2015  . Atrial flutter ([place]) 09/27/2015  . Pressure ulcer 09/05/2015  . Abdominal pain   . Atrial fibrillation with RVR ([place])   . HCAP (healthcare-associated pneumonia)   . Acute renal failure superimposed on stage 3 chronic kidney disease ([place])   . Metabolic encephalopathy   . Abdominal distension   . Urinary tract infection, site not specified   . Cardiogenic shock ([place])   . Acute systolic CHF (congestive heart failure) ([place])   . Uncontrolled type 2 diabetes mellitus with complication ([place])   . Urinary retention   . Bacteremia   . Ischemic colitis ([place])   . Noninfectious gastroenteritis, unspecified   . Proctosigmoiditis   . Lower GI bleed   . SVT (supraventricular tachycardia) ([place]) 08/24/2015  . Debility   . Blood in stool   . Acute blood loss anemia   . Abnormal CT scan, colon   . Acute systolic heart failure ([place])   . Acute kidney injury ([place])   . Acute respiratory failure ([place])   . Septic shock ([place])   . Acute encephalopathy   . Atrial flutter with rapid ventricular response ([place]) 08/14/2015  . Systolic CHF ([place]) 82/42/3536  . History of colonic polyps   . Dehydration 12/05/2013  . Acute renal failure ([place]) 12/02/2013  . Sleep apnea 12/02/2013  . Hyponatremia 12/02/2013  . Acute on chronic renal failure ([place]) 12/02/2013  . COPD (chronic obstructive  pulmonary disease) ([place])   . DM (diabetes mellitus) ([place])   . HTN (hypertension)   . Hyperlipemia   . CRF (chronic renal failure)   . Hepatomegaly 03/25/2013  . Dysphagia 12/06/2010  . History of adenomatous polyp of colon 09/21/2010  . Gastroparesis 09/21/2010  . Obesity 09/21/2010  . GERD (gastroesophageal reflux disease) 10/09/2009  . Constipation 10/09/2009  . ABDOMINAL PAIN-EPIGASTRIC 10/09/2009   PCP:  Sinda Du, MD Pharmacy:   [facility], [place] [address] Phone: [phone] Fax: [phone]   Readmission Risk Interventions No flowsheet data found.

## 2018-06-30 LAB — GLUCOSE, CAPILLARY
Glucose-Capillary: 98 mg/dL (ref 70–99)
Glucose-Capillary: 82 mg/dL (ref 70–99)
Glucose-Capillary: 146 mg/dL — ABNORMAL HIGH (ref 70–99)
Glucose-Capillary: 210 mg/dL — ABNORMAL HIGH (ref 70–99)
Glucose-Capillary: 95 mg/dL (ref 70–99)

## 2018-06-30 MED ORDER — LINACLOTIDE 145 MCG PO CAPS
145.0000 ug | ORAL_CAPSULE | Freq: Every day | ORAL | Status: DC
  Administered 2018-06-30 – 2018-07-01 (×2): 145 ug via ORAL
  Filled 2018-06-30 (×2): qty 1

## 2018-06-30 NOTE — Progress Notes (Signed)
    Subjective: Feels much improved from yesterday. 2 soft well-formed BMs yesterday per nursing staff. No N/V. Appetite is better after multiple bowel movements.   Objective: Vital signs in last 24 hours: Temp:  [98.9 F (37.2 C)-99.2 F (37.3 C)] 99.2 F (37.3 C) (04/28 0508) Pulse Rate:  [65-74] 65 (04/28 0508) Resp:  [16-18] 16 (04/28 0508) BP: (141-157)/(69-93) 157/93 (04/28 0508) SpO2:  [95 %-100 %] 100 % (04/28 0508) Weight:  [113.9 kg] 113.9 kg (04/28 0500) Last BM Date: 06/29/18 General:   Alert and oriented, pleasant Abdomen:  Bowel sounds present, mildly distended upper abdomen but soft, no rebound or guarding, no TTP Extremities:  Without edema. Psych:  Alert and cooperative. Normal mood and affect.  Intake/Output from previous day: 04/27 0701 - 04/28 0700 In: 1893 [P.O.:240; IV Piggyback:203] Out: 804 [Urine:800; Stool:4] Intake/Output this shift: No intake/output data recorded.  BMET Recent Labs    06/28/18 0934 06/29/18 0422  NA 136 139  K 3.3* 3.4*  CL 102 108  CO2 24 24  GLUCOSE 240* 128*  BUN 38* 37*  CREATININE 2.34* 2.04*  CALCIUM 8.9 8.7*    Studies/Results: Dg Chest Port 1 View  Result Date: 06/28/2018 CLINICAL DATA:  CHF. EXAM: PORTABLE CHEST 1 VIEW COMPARISON:  June 27, 2018 FINDINGS: The heart size and mediastinal contours are within normal limits. Both lungs are clear. The visualized skeletal structures are unremarkable. IMPRESSION: No active disease. Electronically Signed   By: Dorise Bullion III M.D   On: 06/28/2018 08:32    Assessment: 68 year old male admitted with urosepsis, obstipation.  CT without contrast noting chronic distal proctocolitis in setting of fecal impaction, likely stercoral etiology. Senna discontinued yesterday. Last dose of Linzess 290 mcg yesterday morning and discontinued due to multiple loose stools. Clinically improved from yesterday, with 2 soft, well-formed BMs. Discussed with Dr. Luan Pulling further bowel  regimen. Will resume Linzess but at a lower dosing of 145 mcg daily. May titrate as needed going forward.   Plan: Linzess 145 mcg daily Hold Senokot Continue PPI Will arrange televisit in next 6-8 weeks Will follow peripherally  Annitta Needs, PhD, ANP-BC [facility]       LOS: 4 days    06/30/2018, 7:49 AM

## 2018-06-30 NOTE — Progress Notes (Signed)
Physical Therapy Treatment Patient Details Name: Frank Hoover MRN: [id number] DOB: [birth year] Today's Date: 06/30/2018    History of Present Illness Frank Hoover is a 68 y.o. male with NICM s/p ICD placement, systolic CHF (EF 40%), stage 3 CKD, Type 2 DM, HTN, HLD and other medical problems detailed below presents to ED complaining of generalized weakness and diarrhea.  He says that he has had a difficult time urinating since yesterday evening.  He has had some low-grade fever at home.  He denies cough shortness of breath runny nose fever and sore throat.  Denies emesis.  He denies having chest pain.  He was brought to the emergency department by EMS.  He has a history of chronic constipation.    PT Comments    Able to increase distance with gait training today.  Cueing for safe mechanics with transfers for hand placement and to feel behind LE prior sitting for safety.   Used RW with gait training, improved steadiness with AD with no LOB, did require cueing to stand within walker and to look up.  EOS pt left in chair with call bell within reach, chair alarm set and RN aware of status.    Follow Up Recommendations  SNF;Supervision for mobility/OOB;Supervision - Intermittent     Equipment Recommendations  None recommended by PT    Recommendations for Other Services       Precautions / Restrictions Precautions Precautions: Fall Restrictions Weight Bearing Restrictions: No    Mobility  Bed Mobility Overal bed mobility: Modified Independent Bed Mobility: Supine to Sit     Supine to sit: Min assist     General bed mobility comments: slow labored movement, increased time to complete  Transfers Overall transfer level: Needs assistance Equipment used: Rolling walker (2 wheeled) Transfers: Sit to/from Stand Sit to Stand: Min assist         General transfer comment: slow labored movement, cueing for mechanics for safety  Ambulation/Gait   Gait Distance (Feet): 40  Feet Assistive device: Rolling walker (2 wheeled) Gait Pattern/deviations: Decreased step length - right;Decreased step length - left;Decreased stride length Gait velocity: slow   General Gait Details: slow labored cadence.  Cueing to standing within walker.  Limited secondary to fatigue   Stairs             Wheelchair Mobility    Modified Rankin (Stroke Patients Only)       Balance                                            Cognition Arousal/Alertness: Awake/alert Behavior During Therapy: WFL for tasks assessed/performed Overall Cognitive Status: Within Functional Limits for tasks assessed                                        Exercises      General Comments        Pertinent Vitals/Pain Pain Assessment: No/denies pain    Home Living                      Prior Function            PT Goals (current goals can now be found in the care plan section)      Frequency  Min 3X/week      PT Plan      Co-evaluation              AM-PAC PT "6 Clicks" Mobility   Outcome Measure  Help needed turning from your back to your side while in a flat bed without using bedrails?: A Little Help needed moving from lying on your back to sitting on the side of a flat bed without using bedrails?: A Lot Help needed moving to and from a bed to a chair (including a wheelchair)?: A Little Help needed standing up from a chair using your arms (e.g., wheelchair or bedside chair)?: A Little Help needed to walk in hospital room?: A Lot Help needed climbing 3-5 steps with a railing? : A Lot 6 Click Score: 15    End of Session Equipment Utilized During Treatment: Gait belt Activity Tolerance: Patient tolerated treatment well;Patient limited by fatigue Patient left: in chair;with call bell/phone within reach Nurse Communication: Mobility status PT Visit Diagnosis: Unsteadiness on feet (R26.81);Other abnormalities of gait and  mobility (R26.89);Muscle weakness (generalized) (M62.81)     Time: 9030-0923 PT Time Calculation (min) (ACUTE ONLY): 15 min  Charges:  $Therapeutic Activity: 8-22 mins                     [address], LPTA; CBIS [phone]   Aldona Lento 06/30/2018, 10:55 AM

## 2018-06-30 NOTE — Progress Notes (Signed)
Subjective: He says he feels better.  He reports one stool yesterday but his nurse reports that he had 2 fairly well formed stools and then had some diarrhea again.  He says he feels better.  He did have trouble urinating after the Foley catheter was taken out so it is back in.  Objective: Vital signs in last 24 hours: Temp:  [98.9 F (37.2 C)-99.2 F (37.3 C)] 99.2 F (37.3 C) (04/28 0508) Pulse Rate:  [65-74] 65 (04/28 0508) Resp:  [16-18] 16 (04/28 0508) BP: (141-157)/(69-93) 157/93 (04/28 0508) SpO2:  [95 %-100 %] 100 % (04/28 0508) Weight:  [113.9 kg] 113.9 kg (04/28 0500) Weight change:  Last BM Date: 06/29/18  Intake/Output from previous day: 04/27 0701 - 04/28 0700 In: 1893 [P.O.:240; IV Piggyback:203] Out: 804 [Urine:800; Stool:4]  PHYSICAL EXAM General appearance: alert, cooperative and no distress Resp: clear to auscultation bilaterally Cardio: regular rate and rhythm, S1, S2 normal, no murmur, click, rub or gallop GI: His abdomen is much less distended and nontender now Extremities: Trace edema  Lab Results:  Results for orders placed or performed during the hospital encounter of 06/26/18 (from the past 48 hour(s))  Basic metabolic panel     Status: Abnormal   Collection Time: 06/28/18  9:34 AM  Result Value Ref Range   Sodium 136 135 - 145 mmol/L   Potassium 3.3 (L) 3.5 - 5.1 mmol/L   Chloride 102 98 - 111 mmol/L   CO2 24 22 - 32 mmol/L   Glucose, Bld 240 (H) 70 - 99 mg/dL   BUN 38 (H) 8 - 23 mg/dL   Creatinine, Ser 2.34 (H) 0.61 - 1.24 mg/dL   Calcium 8.9 8.9 - 10.3 mg/dL   GFR calc non Af Amer 28 (L) >60 mL/min   GFR calc Af Amer 32 (L) >60 mL/min   Anion gap 10 5 - 15    Comment: Performed at [facility], [address] [place], [place] [address]  Lactic acid, plasma     Status: Abnormal   Collection Time: 06/28/18  9:34 AM  Result Value Ref Range   Lactic Acid, Venous 2.0 (HH) 0.5 - 1.9 mmol/L    Comment: CRITICAL RESULT CALLED TO, READ BACK BY AND  VERIFIED WITH: MARTIN,L AT 1117 ON 06/28/2018 BY JPM Performed at [facility], [address] [place], [place] [address]   Glucose, capillary     Status: Abnormal   Collection Time: 06/28/18 11:14 AM  Result Value Ref Range   Glucose-Capillary 245 (H) 70 - 99 mg/dL  Glucose, capillary     Status: Abnormal   Collection Time: 06/28/18  4:12 PM  Result Value Ref Range   Glucose-Capillary 215 (H) 70 - 99 mg/dL  Glucose, capillary     Status: Abnormal   Collection Time: 06/28/18  9:04 PM  Result Value Ref Range   Glucose-Capillary 145 (H) 70 - 99 mg/dL  Glucose, capillary     Status: Abnormal   Collection Time: 06/29/18  2:58 AM  Result Value Ref Range   Glucose-Capillary 122 (H) 70 - 99 mg/dL  Basic metabolic panel     Status: Abnormal   Collection Time: 06/29/18  4:22 AM  Result Value Ref Range   Sodium 139 135 - 145 mmol/L   Potassium 3.4 (L) 3.5 - 5.1 mmol/L   Chloride 108 98 - 111 mmol/L   CO2 24 22 - 32 mmol/L   Glucose, Bld 128 (H) 70 - 99 mg/dL   BUN 37 (H)  8 - 23 mg/dL   Creatinine, Ser 2.04 (H) 0.61 - 1.24 mg/dL   Calcium 8.7 (L) 8.9 - 10.3 mg/dL   GFR calc non Af Amer 33 (L) >60 mL/min   GFR calc Af Amer 38 (L) >60 mL/min   Anion gap 7 5 - 15    Comment: Performed at [facility], [address], [place], [place] [address]  Glucose, capillary     Status: Abnormal   Collection Time: 06/29/18  7:33 AM  Result Value Ref Range   Glucose-Capillary 130 (H) 70 - 99 mg/dL  Glucose, capillary     Status: Abnormal   Collection Time: 06/29/18 11:46 AM  Result Value Ref Range   Glucose-Capillary 190 (H) 70 - 99 mg/dL  Glucose, capillary     Status: Abnormal   Collection Time: 06/29/18  4:36 PM  Result Value Ref Range   Glucose-Capillary 168 (H) 70 - 99 mg/dL  Glucose, capillary     Status: None   Collection Time: 06/29/18  9:28 PM  Result Value Ref Range   Glucose-Capillary 99 70 - 99 mg/dL  Glucose, capillary     Status: None   Collection Time: 06/30/18  2:58 AM  Result  Value Ref Range   Glucose-Capillary 98 70 - 99 mg/dL  Glucose, capillary     Status: None   Collection Time: 06/30/18  7:41 AM  Result Value Ref Range   Glucose-Capillary 82 70 - 99 mg/dL    ABGS No results for input(s): PHART, PO2ART, TCO2, HCO3 in the last 72 hours.  Invalid input(s): PCO2 CULTURES Recent Results (from the past 240 hour(s))  Blood Culture (routine x 2)     Status: None (Preliminary result)   Collection Time: 06/26/18  2:29 PM  Result Value Ref Range Status   Specimen Description   Final    BLOOD RIGHT ARM BOTTLES DRAWN AEROBIC AND ANAEROBIC   Special Requests Blood Culture adequate volume  Final   Culture   Final    NO GROWTH 3 DAYS Performed at [facility], [address], [place], [place] [address]    Report Status PENDING  Incomplete  Blood Culture (routine x 2)     Status: None (Preliminary result)   Collection Time: 06/26/18  2:36 PM  Result Value Ref Range Status   Specimen Description   Final    BLOOD RIGHT ARM BOTTLES DRAWN AEROBIC AND ANAEROBIC   Special Requests Blood Culture adequate volume  Final   Culture   Final    NO GROWTH 3 DAYS Performed at [facility], [address] [place], [place] [address]    Report Status PENDING  Incomplete  MRSA PCR Screening     Status: None   Collection Time: 06/26/18  5:02 PM  Result Value Ref Range Status   MRSA by PCR NEGATIVE NEGATIVE Final    Comment:        The GeneXpert MRSA Assay (FDA approved for NASAL specimens only), is one component of a comprehensive MRSA colonization surveillance program. It is not intended to diagnose MRSA infection nor to guide or monitor treatment for MRSA infections. Performed at [facility], [address], [place], [place] [address]   C difficile quick scan w PCR reflex     Status: None   Collection Time: 06/26/18  6:44 PM  Result Value Ref Range Status   C Diff antigen NEGATIVE NEGATIVE Final   C Diff toxin NEGATIVE NEGATIVE Final   C Diff interpretation No  C. difficile  detected.  Final    Comment: Performed at [facility], [address] [place], [place] [address]   Studies/Results: No results found.  Medications:  Prior to Admission:  Medications Prior to Admission  Medication Sig Dispense Refill Last Dose  . acetaminophen (TYLENOL) 500 MG tablet Take 1,000 mg by mouth daily as needed for moderate pain or headache.    06/25/2018 at Unknown time  . albuterol (PROAIR HFA) 108 (90 BASE) MCG/ACT inhaler Inhale 2 puffs into the lungs every 6 (six) hours as needed for wheezing or shortness of breath.    >30  . allopurinol (ZYLOPRIM) 300 MG tablet Take 300 mg by mouth daily.     06/25/2018 at Unknown time  . alum & mag hydroxide-simeth (MAALOX/MYLANTA) 200-200-20 MG/5ML suspension Take 15 mLs by mouth every 6 (six) hours as needed for indigestion or heartburn.   06/25/2018 at Unknown time  . atorvastatin (LIPITOR) 20 MG tablet Take 20 mg by mouth at bedtime.   06/25/2018 at Unknown time  . cholecalciferol (VITAMIN D) 1000 units tablet Take 1,000 Units by mouth daily.   06/25/2018 at Unknown time  . fluticasone (FLONASE) 50 MCG/ACT nasal spray Place 2 sprays into both nostrils daily.    06/25/2018 at Unknown time  . furosemide (LASIX) 40 MG tablet Take 1 tablet (40 mg total) by mouth every morning. & 20 mg in the evening (Patient taking differently: Take 40 mg by mouth 2 (two) times daily. ) 135 tablet 1 06/25/2018 at Unknown time  . gabapentin (NEURONTIN) 400 MG capsule Take 400 mg by mouth daily.    06/25/2018 at Unknown time  . hydrALAZINE (APRESOLINE) 25 MG tablet Take 75 mg by mouth 3 (three) times daily.   unknown  . insulin lispro (HUMALOG KWIKPEN) 100 UNIT/ML KiwkPen Inject 18-20 Units into the skin as directed. Inject 2 to 18 units based on sliding scale as directed as needed for blood glucose over 200   06/25/2018 at 0900am  . isosorbide mononitrate (IMDUR) 30 MG 24 hr tablet Take 1 tablet (30 mg total) by mouth daily. 90 tablet 0 06/25/2018 at Unknown  time  . LANTUS SOLOSTAR 100 UNIT/ML Solostar Pen Inject 20 Units into the skin 2 (two) times daily.    06/25/2018 at 0900pm  . LINZESS 290 MCG CAPS capsule TAKE 1 CAPSULE ONCE DAILY 30 MINUTES BEFORE BREAKFAST (Patient taking differently: Take 290 mcg by mouth daily before breakfast. ) 90 capsule 3 06/25/2018 at Unknown time  . loperamide (IMODIUM) 2 MG capsule Take 2 mg by mouth daily as needed for constipation.   06/25/2018 at Unknown time  . loratadine (CLARITIN) 10 MG tablet Take 10 mg by mouth daily.   06/25/2018 at Unknown time  . metoprolol succinate (TOPROL-XL) 100 MG 24 hr tablet Take 1 tablet (100 mg total) by mouth 2 (two) times daily. Take with or immediately following a meal. 60 tablet 3 06/25/2018 at 0800pm  . Multiple Vitamin (MULTIVITAMIN WITH MINERALS) TABS tablet Take 1 tablet by mouth daily.   06/25/2018 at Unknown time  . ondansetron (ZOFRAN) 4 MG tablet Take 1 tablet (4 mg total) by mouth every 8 (eight) hours as needed for nausea or vomiting. 30 tablet 1 06/25/2018 at Unknown time  . pantoprazole (PROTONIX) 40 MG tablet Take 1 tablet (40 mg total) by mouth daily.   06/25/2018 at Unknown time  . potassium chloride SA (KLOR-CON M20) 20 MEQ tablet Take 1 tablet (20 mEq total) by mouth daily. 90 tablet 0 06/25/2018 at Unknown  time  . vitamin C (ASCORBIC ACID) 500 MG tablet Take 500 mg by mouth 2 (two) times daily.   06/25/2018 at Unknown time   Scheduled: . allopurinol  300 mg Oral Daily  . atorvastatin  20 mg Oral QHS  . enoxaparin (LOVENOX) injection  30 mg Subcutaneous Q24H  . fluticasone  2 spray Each Nare Daily  . insulin aspart  0-15 Units Subcutaneous TID WC  . insulin aspart  0-5 Units Subcutaneous QHS  . insulin aspart  6 Units Subcutaneous TID WC  . insulin glargine  20 Units Subcutaneous BID  . isosorbide mononitrate  30 mg Oral Daily  . loratadine  10 mg Oral Daily  . metoprolol succinate  100 mg Oral BID  . pantoprazole  40 mg Oral Daily  . potassium chloride  20 mEq  Oral BID   Continuous: . ceFEPime (MAXIPIME) IV 2 g (06/29/18 1431)  . metronidazole 500 mg (06/30/18 0544)   GMW:[id number] **OR** acetaminophen, albuterol, ondansetron **OR** ondansetron (ZOFRAN) IV, traZODone  Assesment: He was admitted with severe sepsis presumably from urinary tract infection.  Unfortunately he did not have urine culture done.  Blood cultures are negative thus far.  He had significant constipation and proctitis and that is better.  He then developed diarrhea.  He seems a little better.  He is still having some loose stools.  He has diabetes and is on sliding scale  He has hypertension which is pretty well controlled  He has hypertensive and diabetic chronic kidney disease and had acute worsening of that likely from dehydration but it is improving  He is been hypokalemic and that will be checked again tomorrow  He has chronic systolic heart failure which seems to be stable.  I think he is euvolemic now Principal Problem:   Severe sepsis ([place]) Active Problems:   GERD (gastroesophageal reflux disease)   Constipation   Sleep apnea   Systolic CHF ([place])   Debility   Uncontrolled type 2 diabetes mellitus with complication (HCC)   Diarrhea   Obstipation   Acute urinary retention   Mild bilateral Hydronephrosis   CKD (chronic kidney disease) stage 3, GFR 30-59 ml/min (HCC)   NICM (nonischemic cardiomyopathy) ([place])   ICD (implantable cardioverter-defibrillator) in place   Hypokalemia   Hyperglycemia   Proctocolitis   UTI (urinary tract infection)   Pressure injury of skin    Plan: Will request urology consultation.  It may be that he just needs to go home with a Foley catheter and have this further evaluated as an outpatient.  He is approaching discharge    LOS: 4 days   Alonza Bogus 06/30/2018, 8:34 AM

## 2018-07-01 LAB — BASIC METABOLIC PANEL
Anion gap: 7 (ref 5–15)
BUN: 26 mg/dL — ABNORMAL HIGH (ref 8–23)
CO2: 25 mmol/L (ref 22–32)
Calcium: 9 mg/dL (ref 8.9–10.3)
Chloride: 109 mmol/L (ref 98–111)
Creatinine, Ser: 1.62 mg/dL — ABNORMAL HIGH (ref 0.61–1.24)
GFR calc Af Amer: 51 mL/min — ABNORMAL LOW (ref >60)
GFR calc non Af Amer: 44 mL/min — ABNORMAL LOW (ref >60)
Glucose, Bld: 102 mg/dL — ABNORMAL HIGH (ref 70–99)
Potassium: 3.3 mmol/L — ABNORMAL LOW (ref 3.5–5.1)
Sodium: 141 mmol/L (ref 135–145)

## 2018-07-01 LAB — CULTURE, BLOOD (ROUTINE X 2)
Culture: NO GROWTH
Culture: NO GROWTH
Special Requests: ADEQUATE
Special Requests: ADEQUATE

## 2018-07-01 LAB — GLUCOSE, CAPILLARY
Glucose-Capillary: 91 mg/dL (ref 70–99)
Glucose-Capillary: 94 mg/dL (ref 70–99)
Glucose-Capillary: 165 mg/dL — ABNORMAL HIGH (ref 70–99)

## 2018-07-01 MED ORDER — LINACLOTIDE 145 MCG PO CAPS: 145.0000 ug | ORAL_CAPSULE | Freq: Every day | ORAL | 5 refills | Status: DC

## 2018-07-01 MED ORDER — METRONIDAZOLE 500 MG PO TABS: 500.0000 mg | ORAL_TABLET | Freq: Three times a day (TID) | ORAL | 0 refills | Status: AC

## 2018-07-01 MED ORDER — CIPROFLOXACIN HCL 500 MG PO TABS: 500.0000 mg | ORAL_TABLET | Freq: Two times a day (BID) | ORAL | 0 refills | Status: AC

## 2018-07-01 MED ORDER — POTASSIUM CHLORIDE CRYS ER 20 MEQ PO TBCR: 20.0000 meq | EXTENDED_RELEASE_TABLET | Freq: Two times a day (BID) | ORAL | 5 refills | Status: DC

## 2018-07-01 MED ORDER — TRAZODONE HCL 50 MG PO TABS: 25.0000 mg | ORAL_TABLET | Freq: Every evening | ORAL | 5 refills | Status: DC | PRN

## 2018-07-01 NOTE — Progress Notes (Signed)
Subjective: He says he feels okay and wants to go home.  I discussed his situation by telephone with Dr. Erasmo Leventhal of urology and he suggested that he be discharged home with Foley catheter in place and they would take care of doing voiding trials.  His diarrhea has slowed.  Objective: Vital signs in last 24 hours: Temp:  [98.6 F (37 C)-99.2 F (37.3 C)] 98.6 F (37 C) (04/29 0541) Pulse Rate:  [61-64] 64 (04/29 0541) Resp:  [18] 18 (04/29 0541) BP: (147-153)/(83-90) 149/90 (04/29 0541) SpO2:  [96 %-98 %] 98 % (04/29 0541) Weight:  [114.7 kg] 114.7 kg (04/29 0332) Weight change: 0.8 kg Last BM Date: 06/30/18  Intake/Output from previous day: 04/28 0701 - 04/29 0700 In: 779.1 [P.O.:480; IV Piggyback:299.1] Out: 1400 [Urine:1400]  PHYSICAL EXAM General appearance: alert, cooperative and no distress Resp: clear to auscultation bilaterally Cardio: regular rate and rhythm, S1, S2 normal, no murmur, click, rub or gallop GI: soft, non-tender; bowel sounds normal; no masses,  no organomegaly Extremities: extremities normal, atraumatic, no cyanosis or edema  Lab Results:  Results for orders placed or performed during the hospital encounter of 06/26/18 (from the past 48 hour(s))  Glucose, capillary     Status: Abnormal   Collection Time: 06/29/18 11:46 AM  Result Value Ref Range   Glucose-Capillary 190 (H) 70 - 99 mg/dL  Glucose, capillary     Status: Abnormal   Collection Time: 06/29/18  4:36 PM  Result Value Ref Range   Glucose-Capillary 168 (H) 70 - 99 mg/dL  Glucose, capillary     Status: None   Collection Time: 06/29/18  9:28 PM  Result Value Ref Range   Glucose-Capillary 99 70 - 99 mg/dL  Glucose, capillary     Status: None   Collection Time: 06/30/18  2:58 AM  Result Value Ref Range   Glucose-Capillary 98 70 - 99 mg/dL  Glucose, capillary     Status: None   Collection Time: 06/30/18  7:41 AM  Result Value Ref Range   Glucose-Capillary 82 70 - 99 mg/dL  Glucose,  capillary     Status: Abnormal   Collection Time: 06/30/18 11:40 AM  Result Value Ref Range   Glucose-Capillary 146 (H) 70 - 99 mg/dL  Glucose, capillary     Status: Abnormal   Collection Time: 06/30/18  4:22 PM  Result Value Ref Range   Glucose-Capillary 210 (H) 70 - 99 mg/dL   Comment 1 Notify RN    Comment 2 Document in Chart   Glucose, capillary     Status: None   Collection Time: 06/30/18  9:33 PM  Result Value Ref Range   Glucose-Capillary 95 70 - 99 mg/dL  Glucose, capillary     Status: None   Collection Time: 07/01/18  3:25 AM  Result Value Ref Range   Glucose-Capillary 91 70 - 99 mg/dL  Basic metabolic panel     Status: Abnormal   Collection Time: 07/01/18  4:16 AM  Result Value Ref Range   Sodium 141 135 - 145 mmol/L   Potassium 3.3 (L) 3.5 - 5.1 mmol/L   Chloride 109 98 - 111 mmol/L   CO2 25 22 - 32 mmol/L   Glucose, Bld 102 (H) 70 - 99 mg/dL   BUN 26 (H) 8 - 23 mg/dL   Creatinine, Ser 1.62 (H) 0.61 - 1.24 mg/dL   Calcium 9.0 8.9 - 10.3 mg/dL   GFR calc non Af Amer 44 (L) >60 mL/min   GFR calc Af  Amer 51 (L) >60 mL/min   Anion gap 7 5 - 15    Comment: Performed at [facility], [address] [place], [place] [address]  Glucose, capillary     Status: None   Collection Time: 07/01/18  7:36 AM  Result Value Ref Range   Glucose-Capillary 94 70 - 99 mg/dL    ABGS No results for input(s): PHART, PO2ART, TCO2, HCO3 in the last 72 hours.  Invalid input(s): PCO2 CULTURES Recent Results (from the past 240 hour(s))  Blood Culture (routine x 2)     Status: None   Collection Time: 06/26/18  2:29 PM  Result Value Ref Range Status   Specimen Description   Final    BLOOD RIGHT ARM BOTTLES DRAWN AEROBIC AND ANAEROBIC   Special Requests Blood Culture adequate volume  Final   Culture   Final    NO GROWTH 5 DAYS Performed at [facility], [address] [place], [place] [address]    Report Status 07/01/2018 FINAL  Final  Blood Culture (routine x 2)     Status: None    Collection Time: 06/26/18  2:36 PM  Result Value Ref Range Status   Specimen Description   Final    BLOOD RIGHT ARM BOTTLES DRAWN AEROBIC AND ANAEROBIC   Special Requests Blood Culture adequate volume  Final   Culture   Final    NO GROWTH 5 DAYS Performed at [facility], [address] [place], [place] [address]    Report Status 07/01/2018 FINAL  Final  MRSA PCR Screening     Status: None   Collection Time: 06/26/18  5:02 PM  Result Value Ref Range Status   MRSA by PCR NEGATIVE NEGATIVE Final    Comment:        The GeneXpert MRSA Assay (FDA approved for NASAL specimens only), is one component of a comprehensive MRSA colonization surveillance program. It is not intended to diagnose MRSA infection nor to guide or monitor treatment for MRSA infections. Performed at [facility], [address], [place], [place] [address]   C difficile quick scan w PCR reflex     Status: None   Collection Time: 06/26/18  6:44 PM  Result Value Ref Range Status   C Diff antigen NEGATIVE NEGATIVE Final   C Diff toxin NEGATIVE NEGATIVE Final   C Diff interpretation No C. difficile detected.  Final    Comment: Performed at [facility], [address], [place],  [address]   Studies/Results: No results found.  Medications:  Prior to Admission:  Medications Prior to Admission  Medication Sig Dispense Refill Last Dose  . acetaminophen (TYLENOL) 500 MG tablet Take 1,000 mg by mouth daily as needed for moderate pain or headache.    06/25/2018 at Unknown time  . albuterol (PROAIR HFA) 108 (90 BASE) MCG/ACT inhaler Inhale 2 puffs into the lungs every 6 (six) hours as needed for wheezing or shortness of breath.    >30  . allopurinol (ZYLOPRIM) 300 MG tablet Take 300 mg by mouth daily.     06/25/2018 at Unknown time  . alum & mag hydroxide-simeth (MAALOX/MYLANTA) 200-200-20 MG/5ML suspension Take 15 mLs by mouth every 6 (six) hours as needed for indigestion or heartburn.   06/25/2018 at Unknown time  .  atorvastatin (LIPITOR) 20 MG tablet Take 20 mg by mouth at bedtime.   06/25/2018 at Unknown time  . cholecalciferol (VITAMIN D) 1000 units tablet Take 1,000 Units by mouth daily.   06/25/2018 at Unknown time  .  fluticasone (FLONASE) 50 MCG/ACT nasal spray Place 2 sprays into both nostrils daily.    06/25/2018 at Unknown time  . furosemide (LASIX) 40 MG tablet Take 1 tablet (40 mg total) by mouth every morning. & 20 mg in the evening (Patient taking differently: Take 40 mg by mouth 2 (two) times daily. ) 135 tablet 1 06/25/2018 at Unknown time  . gabapentin (NEURONTIN) 400 MG capsule Take 400 mg by mouth daily.    06/25/2018 at Unknown time  . hydrALAZINE (APRESOLINE) 25 MG tablet Take 75 mg by mouth 3 (three) times daily.   unknown  . insulin lispro (HUMALOG KWIKPEN) 100 UNIT/ML KiwkPen Inject 18-20 Units into the skin as directed. Inject 2 to 18 units based on sliding scale as directed as needed for blood glucose over 200   06/25/2018 at 0900am  . isosorbide mononitrate (IMDUR) 30 MG 24 hr tablet Take 1 tablet (30 mg total) by mouth daily. 90 tablet 0 06/25/2018 at Unknown time  . LANTUS SOLOSTAR 100 UNIT/ML Solostar Pen Inject 20 Units into the skin 2 (two) times daily.    06/25/2018 at 0900pm  . LINZESS 290 MCG CAPS capsule TAKE 1 CAPSULE ONCE DAILY 30 MINUTES BEFORE BREAKFAST (Patient taking differently: Take 290 mcg by mouth daily before breakfast. ) 90 capsule 3 06/25/2018 at Unknown time  . loperamide (IMODIUM) 2 MG capsule Take 2 mg by mouth daily as needed for constipation.   06/25/2018 at Unknown time  . loratadine (CLARITIN) 10 MG tablet Take 10 mg by mouth daily.   06/25/2018 at Unknown time  . metoprolol succinate (TOPROL-XL) 100 MG 24 hr tablet Take 1 tablet (100 mg total) by mouth 2 (two) times daily. Take with or immediately following a meal. 60 tablet 3 06/25/2018 at 0800pm  . Multiple Vitamin (MULTIVITAMIN WITH MINERALS) TABS tablet Take 1 tablet by mouth daily.   06/25/2018 at Unknown time  .  ondansetron (ZOFRAN) 4 MG tablet Take 1 tablet (4 mg total) by mouth every 8 (eight) hours as needed for nausea or vomiting. 30 tablet 1 06/25/2018 at Unknown time  . pantoprazole (PROTONIX) 40 MG tablet Take 1 tablet (40 mg total) by mouth daily.   06/25/2018 at Unknown time  . potassium chloride SA (KLOR-CON M20) 20 MEQ tablet Take 1 tablet (20 mEq total) by mouth daily. 90 tablet 0 06/25/2018 at Unknown time  . vitamin C (ASCORBIC ACID) 500 MG tablet Take 500 mg by mouth 2 (two) times daily.   06/25/2018 at Unknown time   Scheduled: . allopurinol  300 mg Oral Daily  . atorvastatin  20 mg Oral QHS  . enoxaparin (LOVENOX) injection  30 mg Subcutaneous Q24H  . fluticasone  2 spray Each Nare Daily  . insulin aspart  0-15 Units Subcutaneous TID WC  . insulin aspart  0-5 Units Subcutaneous QHS  . insulin aspart  6 Units Subcutaneous TID WC  . insulin glargine  20 Units Subcutaneous BID  . isosorbide mononitrate  30 mg Oral Daily  . linaclotide  145 mcg Oral QAC breakfast  . loratadine  10 mg Oral Daily  . metoprolol succinate  100 mg Oral BID  . pantoprazole  40 mg Oral Daily  . potassium chloride  20 mEq Oral BID   Continuous: . ceFEPime (MAXIPIME) IV 2 g (06/30/18 1457)  . metronidazole 500 mg (07/01/18 0544)   LPF:[id number] **OR** acetaminophen, albuterol, ondansetron **OR** ondansetron (ZOFRAN) IV, traZODone  Assesment: He was admitted with severe sepsis presumably from a urinary origin  but he did not have a urine culture done.  He also had proctocolitis so he could have had some sort of an intra-abdominal infection.  He is substantially better.  Sepsis pathophysiology has resolved.  He was severely constipated and that is doing better.  He had some diarrhea after the constipation was relieved  He has chronic systolic heart failure which seems stable.  He has an implantable defibrillator but he has not had it fire  He has diabetes which has not been well controlled  He has acute  urinary retention and failed voiding trial here.  He also has mild bilateral hydronephrosis thought to be related to his urinary retention and that will need to be reevaluated  He has general debility and has difficulty getting around at home.  It was recommended that he go to skilled care facility but he and his family want him to come home. Principal Problem:   Severe sepsis ([place]) Active Problems:   GERD (gastroesophageal reflux disease)   Constipation   Sleep apnea   Systolic CHF ([place])   Debility   Uncontrolled type 2 diabetes mellitus with complication (HCC)   Diarrhea   Obstipation   Acute urinary retention   Mild bilateral Hydronephrosis   CKD (chronic kidney disease) stage 3, GFR 30-59 ml/min (HCC)   NICM (nonischemic cardiomyopathy) ([place])   ICD (implantable cardioverter-defibrillator) in place   Hypokalemia   Hyperglycemia   Proctocolitis   UTI (urinary tract infection)   Pressure injury of skin    Plan: Discharge home today with home health services    LOS: 5 days   Alonza Bogus 07/01/2018, 8:54 AM

## 2018-07-01 NOTE — Progress Notes (Signed)
Physical Therapy Treatment Patient Details Name: Frank Hoover MRN: [id number] DOB: [birth year] Today's Date: 07/01/2018    History of Present Illness Frank Hoover is a 68 y.o. male with NICM s/p ICD placement, systolic CHF (EF 12%), stage 3 CKD, Type 2 DM, HTN, HLD and other medical problems detailed below presents to ED complaining of generalized weakness and diarrhea.  He says that he has had a difficult time urinating since yesterday evening.  He has had some low-grade fever at home.  He denies cough shortness of breath runny nose fever and sore throat.  Denies emesis.  He denies having chest pain.  He was brought to the emergency department by EMS.  He has a history of chronic constipation.    PT Comments    Pt supine and willing to participate with therapy.  RN entered room during session and plans on DC later today.  New RW in room for pt to take home, therapist adjusted walker for proper height and used for gait training.  Min A with bed mobility and transfer training, mainly cueing for hand placement to promote independence safely at home.  Cueing through gait training to stand within walker and increase stride length to reduce shuffle mechanics, no LOB episodes..  EOS pt left in chair with call bell within reach, RN aware of status.    Follow Up Recommendations  SNF;Supervision for mobility/OOB;Supervision - Intermittent     Equipment Recommendations  None recommended by PT    Recommendations for Other Services       Precautions / Restrictions Precautions Precautions: Fall Restrictions Weight Bearing Restrictions: No    Mobility  Bed Mobility Overal bed mobility: Modified Independent Bed Mobility: Supine to Sit     Supine to sit: Min assist     General bed mobility comments: slow labored movement, increased time to complete  Transfers Overall transfer level: Needs assistance Equipment used: Rolling walker (2 wheeled) Transfers: Sit to/from Stand Sit to  Stand: Min assist         General transfer comment: slow labored movement, cueing for mechanics for safety  Ambulation/Gait Ambulation/Gait assistance: Min assist Gait Distance (Feet): 40 Feet Assistive device: Rolling walker (2 wheeled) Gait Pattern/deviations: Decreased step length - right;Decreased step length - left;Decreased stride length Gait velocity: slow   General Gait Details: Personal RW adjusted to proper height by therapist.  Cueing to standing within walker and constant cueing to increase stride length to reduce shuffle mechanics.  SLow labored cadence   Stairs             Wheelchair Mobility    Modified Rankin (Stroke Patients Only)       Balance                                            Cognition Arousal/Alertness: Awake/alert Behavior During Therapy: WFL for tasks assessed/performed Overall Cognitive Status: Within Functional Limits for tasks assessed                                        Exercises      General Comments        Pertinent Vitals/Pain Pain Assessment: No/denies pain    Home Living  Prior Function            PT Goals (current goals can now be found in the care plan section)      Frequency    Min 3X/week      PT Plan      Co-evaluation              AM-PAC PT "6 Clicks" Mobility   Outcome Measure  Help needed turning from your back to your side while in a flat bed without using bedrails?: A Little Help needed moving from lying on your back to sitting on the side of a flat bed without using bedrails?: A Lot Help needed moving to and from a bed to a chair (including a wheelchair)?: A Little Help needed standing up from a chair using your arms (e.g., wheelchair or bedside chair)?: A Little Help needed to walk in hospital room?: A Lot Help needed climbing 3-5 steps with a railing? : A Lot 6 Click Score: 15    End of Session Equipment  Utilized During Treatment: Gait belt Activity Tolerance: Patient tolerated treatment well;Patient limited by fatigue Patient left: in chair;with call bell/phone within reach Nurse Communication: Mobility status PT Visit Diagnosis: Unsteadiness on feet (R26.81);Other abnormalities of gait and mobility (R26.89);Muscle weakness (generalized) (M62.81)     Time: 6979-4801 PT Time Calculation (min) (ACUTE ONLY): 17 min  Charges:  $Therapeutic Activity: 8-22 mins                     [address] LPTA; CBIS [phone]  Aldona Lento 07/01/2018, 10:24 AM

## 2018-07-01 NOTE — TOC Transition Note (Signed)
Transition of Care [facility]) - CM/SW Discharge Note   Patient Details  Name: Frank Hoover MRN: [id number] Date of Birth: 1950-12-06  Transition of Care [facility]) CM/SW Contact:  Milda Lindvall, Chauncey Reading, RN Phone Number: 07/01/2018, 9:08 AM   Clinical Narrative:   Patient discharging home today. Notified Santiago Glad of [employer]. Patient discharging home with a foley catheter. Home Health RN and PT ordered. Santiago Glad aware that patient will need a BMET drawn May 1st, 2020.      Patient Goals and CMS Choice Patient states their goals for this hospitalization and ongoing recovery are:: Pt wishes to return home with contiunued Family and CNA support CMS Medicare.gov Compare Post Acute Care list provided to:: Patient Choice offered to / list presented to : Patient  Discharge Plan and Services   Discharge Planning Services: CM Consult Post Acute Care Choice: Durable Medical Equipment, Home Health          DME Arranged: Walker rolling DME Agency: [employer] Date DME Agency Contacted: 06/29/18 Time DME Agency Contacted: [phone] Representative spoke with at DME Agency: [place]: PT, Refused SNF [facility] Agency: [place] Date [place]: 06/29/18 Time [place]: 1324 Representative spoke with at [place]: [place]    Readmission Risk Interventions Readmission Risk Prevention Plan 06/29/2018  Transportation Screening Complete  PCP or Specialist Appt within 3-5 Days Complete  HRI or [place] Complete  Social Work Consult for [place] Planning/Counseling [place] Not Applicable  Medication Review Press photographer) Complete  Some recent data might be hidden

## 2018-07-01 NOTE — Discharge Summary (Signed)
Physician Discharge Summary  Patient ID: Frank Hoover MRN: [id number] DOB/AGE: 04-01-1950 68 y.o. Primary Care Physician:Lodema Parma, Percell [place], MD Admit date: 06/26/2018 Discharge date: 07/01/2018    Discharge Diagnoses:   Principal Problem:   Severe sepsis [facility]) Active Problems:   GERD (gastroesophageal reflux disease)   Constipation   Sleep apnea   Systolic CHF ([place])   Debility   Uncontrolled type 2 diabetes mellitus with complication (HCC)   Diarrhea   Obstipation   Acute urinary retention   Mild bilateral Hydronephrosis   CKD (chronic kidney disease) stage 3, GFR 30-59 ml/min (HCC)   NICM (nonischemic cardiomyopathy) ([place])   ICD (implantable cardioverter-defibrillator) in place   Hypokalemia   Hyperglycemia   Proctocolitis   UTI (urinary tract infection)   Pressure injury of skin   Allergies as of 07/01/2018   No Known Allergies     Medication List    STOP taking these medications   loperamide 2 MG capsule Commonly known as:  IMODIUM     TAKE these medications   acetaminophen 500 MG tablet Commonly known as:  TYLENOL Take 1,000 mg by mouth daily as needed for moderate pain or headache.   allopurinol 300 MG tablet Commonly known as:  ZYLOPRIM Take 300 mg by mouth daily.   alum & mag hydroxide-simeth 200-200-20 MG/5ML suspension Commonly known as:  MAALOX/MYLANTA Take 15 mLs by mouth every 6 (six) hours as needed for indigestion or heartburn.   atorvastatin 20 MG tablet Commonly known as:  LIPITOR Take 20 mg by mouth at bedtime.   cholecalciferol 1000 units tablet Commonly known as:  VITAMIN D Take 1,000 Units by mouth daily.   ciprofloxacin 500 MG tablet Commonly known as:  CIPRO Take 1 tablet (500 mg total) by mouth 2 (two) times daily for 5 days.   fluticasone 50 MCG/ACT nasal spray Commonly known as:  FLONASE Place 2 sprays into both nostrils daily.   furosemide 40 MG tablet Commonly known as:  LASIX Take 1 tablet (40 mg total) by mouth  every morning. & 20 mg in the evening What changed:    when to take this  additional instructions   gabapentin 400 MG capsule Commonly known as:  NEURONTIN Take 400 mg by mouth daily.   HumaLOG KwikPen 100 UNIT/ML KiwkPen Generic drug:  insulin lispro Inject 18-20 Units into the skin as directed. Inject 2 to 18 units based on sliding scale as directed as needed for blood glucose over 200   hydrALAZINE 25 MG tablet Commonly known as:  APRESOLINE Take 75 mg by mouth 3 (three) times daily.   isosorbide mononitrate 30 MG 24 hr tablet Commonly known as:  IMDUR Take 1 tablet (30 mg total) by mouth daily.   Lantus SoloStar 100 UNIT/ML Solostar Pen Generic drug:  Insulin Glargine Inject 20 Units into the skin 2 (two) times daily.   linaclotide 145 MCG Caps capsule Commonly known as:  LINZESS Take 1 capsule (145 mcg total) by mouth daily before breakfast. Start taking on:  July 02, 2018 What changed:    medication strength  See the new instructions.   loratadine 10 MG tablet Commonly known as:  CLARITIN Take 10 mg by mouth daily.   metoprolol succinate 100 MG 24 hr tablet Commonly known as:  TOPROL-XL Take 1 tablet (100 mg total) by mouth 2 (two) times daily. Take with or immediately following a meal.   metroNIDAZOLE 500 MG tablet Commonly known as:  Flagyl Take 1 tablet (500 mg total) by mouth  3 (three) times daily for 14 days.   multivitamin with minerals Tabs tablet Take 1 tablet by mouth daily.   ondansetron 4 MG tablet Commonly known as:  ZOFRAN Take 1 tablet (4 mg total) by mouth every 8 (eight) hours as needed for nausea or vomiting.   pantoprazole 40 MG tablet Commonly known as:  PROTONIX Take 1 tablet (40 mg total) by mouth daily.   potassium chloride SA 20 MEQ tablet Commonly known as:  K-DUR Take 1 tablet (20 mEq total) by mouth 2 (two) times daily. What changed:  when to take this   ProAir HFA 108 (90 Base) MCG/ACT inhaler Generic drug:   albuterol Inhale 2 puffs into the lungs every 6 (six) hours as needed for wheezing or shortness of breath.   traZODone 50 MG tablet Commonly known as:  DESYREL Take 0.5 tablets (25 mg total) by mouth at bedtime as needed for sleep.   vitamin C 500 MG tablet Commonly known as:  ASCORBIC ACID Take 500 mg by mouth 2 (two) times daily.            Durable Medical Equipment  (From admission, onward)         Start     Ordered   06/29/18 1414  For home use only DME Walker rolling  Once    Question:  Patient needs a walker to treat with the following condition  Answer:  Weakness   06/29/18 1414          Discharged Condition: Improved    Consults: Gastroenterology, Dr. Oneida Alar  Significant Diagnostic Studies: Ct Abdomen Pelvis Wo Contrast  Result Date: 06/26/2018 CLINICAL DATA:  Abdominal distention with diarrhea and weakness. EXAM: CT ABDOMEN AND PELVIS WITHOUT CONTRAST TECHNIQUE: Multidetector CT imaging of the abdomen and pelvis was performed following the standard protocol without IV contrast. COMPARISON:  CT abdomen pelvis dated August 16, 2015. FINDINGS: Lower chest: No acute abnormality. Hepatobiliary: No focal liver abnormality. Tiny gallstone. No gallbladder wall thickening or biliary dilatation. Pancreas: Atrophic. No ductal dilatation or surrounding inflammatory changes. Spleen: Normal in size without focal abnormality. Adrenals/Urinary Tract: The adrenal glands are unremarkable. Mild bilateral hydroureteronephrosis. No renal or ureteral calculi. No focal renal lesion. The bladder is decompressed by Foley catheter. Stomach/Bowel: The stomach and small bowel are unremarkable. No obstruction. Large amount of stool throughout the colon with mild wall thickening of the sigmoid colon and rectum with surrounding inflammatory changes, similar to prior study. Normal appendix. Vascular/Lymphatic: Aortic atherosclerosis. No enlarged abdominal or pelvic lymph nodes. Reproductive: Prostate  is unremarkable. Other: Unchanged small fat containing umbilical hernia. No free fluid or pneumoperitoneum. Unchanged presacral soft tissue stranding. Musculoskeletal: No acute or significant osseous findings. Unchanged small lipomas in the right adductor musculature and left rectus femoris muscle. IMPRESSION: 1. 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 2. Mild bilateral hydroureteronephrosis without evident obstruction. 3. Cholelithiasis. 4.  Aortic atherosclerosis (ICD10-I70.0). Electronically Signed   By: Titus Dubin M.D.   On: 06/26/2018 13:27   US Renal  Result Date: 06/27/2018 CLINICAL DATA:  Bilateral hydronephrosis. EXAM: RENAL / URINARY TRACT ULTRASOUND COMPLETE COMPARISON:  CT scan June 26, 2018 FINDINGS: Right Kidney: Renal measurements: 9.0 x 4.6 x 4.9 cm = volume: 106.5 mL. Mild hydronephrosis. No other abnormalities. Left Kidney: Renal measurements: 10.6 x 4.7 x 5.4 cm = volume: 142.6 mL. No hydronephrosis seen on limited  views. Bladder: Decompressed with a Foley catheter not well evaluated. IMPRESSION: 1. Mild hydronephrosis remains on the right, unchanged since yesterday's CT scan. No hydronephrosis seen on limited views of the left kidney. The bladder is decompressed with a Foley catheter are not well evaluated. Electronically Signed   By: Dorise Bullion III M.D   On: 06/27/2018 12:01   Dg Chest Port 1 View  Result Date: 06/28/2018 CLINICAL DATA:  CHF. EXAM: PORTABLE CHEST 1 VIEW COMPARISON:  June 27, 2018 FINDINGS: The heart size and mediastinal contours are within normal limits. Both lungs are clear. The visualized skeletal structures are unremarkable. IMPRESSION: No active disease. Electronically Signed   By: Dorise Bullion III M.D   On: 06/28/2018 08:32   Portable Chest 1 View  Result Date:  06/27/2018 CLINICAL DATA:  Severe sepsis. EXAM: PORTABLE CHEST 1 VIEW COMPARISON:  June 12, 2016 FINDINGS: Stable AICD device. No pneumothorax. No change in the cardiomediastinal silhouette. Mild increased thickening of the right fissure. No focal infiltrate in the lungs. IMPRESSION: Mild increased thickening of the right fissure could represent pleural thickening versus a tiny amount of fluid in the fissure. No acute infiltrate. No cause for sepsis noted. Electronically Signed   By: Dorise Bullion III M.D   On: 06/27/2018 10:19    Lab Results: Basic Metabolic Panel: Recent Labs    06/29/18 0422 07/01/18 0416  NA 139 141  K 3.4* 3.3*  CL 108 109  CO2 24 25  GLUCOSE 128* 102*  BUN 37* 26*  CREATININE 2.04* 1.62*  CALCIUM 8.7* 9.0   Liver Function Tests: No results for input(s): AST, ALT, ALKPHOS, BILITOT, PROT, ALBUMIN in the last 72 hours.   CBC: No results for input(s): WBC, NEUTROABS, HGB, HCT, MCV, PLT in the last 72 hours.  Recent Results (from the past 240 hour(s))  Blood Culture (routine x 2)     Status: None   Collection Time: 06/26/18  2:29 PM  Result Value Ref Range Status   Specimen Description   Final    BLOOD RIGHT ARM BOTTLES DRAWN AEROBIC AND ANAEROBIC   Special Requests Blood Culture adequate volume  Final   Culture   Final    NO GROWTH 5 DAYS Performed at [facility], [address], [place], [place] [address]    Report Status 07/01/2018 FINAL  Final  Blood Culture (routine x 2)     Status: None   Collection Time: 06/26/18  2:36 PM  Result Value Ref Range Status   Specimen Description   Final    BLOOD RIGHT ARM BOTTLES DRAWN AEROBIC AND ANAEROBIC   Special Requests Blood Culture adequate volume  Final   Culture   Final    NO GROWTH 5 DAYS Performed at [facility], [address] [place], [place] [address]    Report Status 07/01/2018 FINAL  Final  MRSA PCR Screening     Status: None   Collection Time: 06/26/18  5:02 PM  Result Value Ref Range Status    MRSA by PCR NEGATIVE NEGATIVE Final    Comment:        The GeneXpert MRSA Assay (FDA approved for NASAL specimens only), is one component of a comprehensive MRSA colonization surveillance program. It is not intended to diagnose MRSA infection nor to guide or monitor treatment for MRSA infections. Performed at [facility], [address], [place], [place] [address]   C difficile quick scan w PCR reflex     Status: None   Collection Time:  06/26/18  6:44 PM  Result Value Ref Range Status   C Diff antigen NEGATIVE NEGATIVE Final   C Diff toxin NEGATIVE NEGATIVE Final   C Diff interpretation No C. difficile detected.  Final    Comment: Performed at [facility], [address], [place], [place] [address]     Hospital Course: This is a 68 year old who had come to the emergency department because of generalized weakness and diarrhea.  He has had trouble urinating.  He has had low-grade fever.  He has not had any chest pain.  He went to the emergency department and had bladder scanning showing greater than 999 cc of urine with mild bilateral hydronephrosis.  CT showed large amounts of stool and proctocolitis.  His lactic acid was markedly elevated greater than 5.  He was given IV fluids and started on broad-spectrum antibiotic treatment.  He responded well to treatment.  He unfortunately did not have a urine culture done.  He was started on antibiotics as mentioned given IV fluids and improved.  He had PT consultation and it was recommended that he go to skilled care facility for rehab but he refused.  His Foley catheter was removed but he failed voiding trial so he is going to have to go home with the Foley catheter.  He had acute on chronic renal failure which is better.  His potassium is being replaced but is still 3.3 on the day of discharge and will be rechecked in 2 days.  He is afebrile.  He had significant problems with constipation and then had diarrhea and now has more normal bowel  movements.  Discharge Exam: Blood pressure (!) 149/90, pulse 64, temperature 98.6 F (37 C), temperature source Oral, resp. rate 18, height 5' 6"  (1.676 m), weight 114.7 kg, SpO2 98 %. He is awake and alert.  No distress.  His abdomen is soft.  Chest is clear.  Heart is regular.  Disposition: Home with Foley catheter and with home health services.  I had discussed his situation with urology and they will plan to assume catheter care and schedule voiding trials at their office.  Discharge Instructions    Face-to-face encounter (required for Medicare/Medicaid patients)   Complete by:  As directed    I Alonza Bogus certify that this patient is under my care and that I, or a nurse practitioner or physician's assistant working with me, had a face-to-face encounter that meets the physician face-to-face encounter requirements with this patient on 07/01/2018. The encounter with the patient was in whole, or in part for the following medical condition(s) which is the primary reason for home health care (List medical condition): Sepsis/urinary tract infection/proctocolitis   The encounter with the patient was in whole, or in part, for the following medical condition, which is the primary reason for home health care:  Sepsis/urinary tract infection/proctocolitis   I certify that, based on my findings, the following services are medically necessary home health services:   Nursing Physical therapy     Reason for Medically Necessary Home Health Services:  Skilled Nursing- Change/Decline in Patient Status   My clinical findings support the need for the above services:  Unable to leave home safely without assistance and/or assistive device   Further, I certify that my clinical findings support that this patient is homebound due to:  Unable to leave home safely without assistance   Home Health   Complete by:  As directed    To provide the following care/treatments:   PT  RN     Please check basic metabolic  profile on 0/07/6977      Follow-up Information    [facility] [place] Follow up.   Why:  Home Health PT Contact information: [place] [address] [phone]        Sinda Du, MD. Call in 9 day(s).   Specialty:  Pulmonary Disease Why:  [facility] Appointment - May 6th 10:30 Contact information: [address] [place] [address] [phone]           Signed: Alonza Bogus   07/01/2018, 9:04 AM

## 2018-07-01 NOTE — Progress Notes (Signed)
PT DISCHARGED TO HOME, IV REMOVED, ANGIO INTACT, SITE CD&I, NO S&S OF INFECTION NOTED, VS STABLE, DENIES C/O PAIN, PT LEFT FLOOR STABLE WITH BELONGINGS VIA WHEELCHAIR ACCOMPANIED BY NURSING STAFF. PT MET AT SHORT STAY ENTRANCE BY SON FOR TRANSPORTATION HOME.

## 2018-07-03 ENCOUNTER — Ambulatory Visit: Payer: Medicare Other | Admitting: Cardiology

## 2018-07-03 DIAGNOSIS — R338 Other retention of urine: Secondary | ICD-10-CM | POA: Diagnosis not present

## 2018-07-03 DIAGNOSIS — I5020 Unspecified systolic (congestive) heart failure: Secondary | ICD-10-CM | POA: Diagnosis not present

## 2018-07-03 DIAGNOSIS — Z466 Encounter for fitting and adjustment of urinary device: Secondary | ICD-10-CM | POA: Diagnosis not present

## 2018-07-03 DIAGNOSIS — K219 Gastro-esophageal reflux disease without esophagitis: Secondary | ICD-10-CM | POA: Diagnosis not present

## 2018-07-03 DIAGNOSIS — N183 Chronic kidney disease, stage 3 (moderate): Secondary | ICD-10-CM | POA: Diagnosis not present

## 2018-07-03 DIAGNOSIS — I509 Heart failure, unspecified: Secondary | ICD-10-CM | POA: Diagnosis not present

## 2018-07-03 DIAGNOSIS — N390 Urinary tract infection, site not specified: Secondary | ICD-10-CM | POA: Diagnosis not present

## 2018-07-03 DIAGNOSIS — K5120 Ulcerative (chronic) proctitis without complications: Secondary | ICD-10-CM | POA: Diagnosis not present

## 2018-07-03 DIAGNOSIS — I130 Hypertensive heart and chronic kidney disease with heart failure and stage 1 through stage 4 chronic kidney disease, or unspecified chronic kidney disease: Secondary | ICD-10-CM | POA: Diagnosis not present

## 2018-07-03 DIAGNOSIS — Z794 Long term (current) use of insulin: Secondary | ICD-10-CM | POA: Diagnosis not present

## 2018-07-03 DIAGNOSIS — E1165 Type 2 diabetes mellitus with hyperglycemia: Secondary | ICD-10-CM | POA: Diagnosis not present

## 2018-07-03 DIAGNOSIS — A419 Sepsis, unspecified organism: Secondary | ICD-10-CM | POA: Diagnosis not present

## 2018-07-03 DIAGNOSIS — I429 Cardiomyopathy, unspecified: Secondary | ICD-10-CM | POA: Diagnosis not present

## 2018-07-03 DIAGNOSIS — G4733 Obstructive sleep apnea (adult) (pediatric): Secondary | ICD-10-CM | POA: Diagnosis not present

## 2018-07-03 DIAGNOSIS — G4730 Sleep apnea, unspecified: Secondary | ICD-10-CM | POA: Diagnosis not present

## 2018-07-03 DIAGNOSIS — N049 Nephrotic syndrome with unspecified morphologic changes: Secondary | ICD-10-CM | POA: Diagnosis not present

## 2018-07-03 DIAGNOSIS — K5900 Constipation, unspecified: Secondary | ICD-10-CM | POA: Diagnosis not present

## 2018-07-03 DIAGNOSIS — Z9181 History of falling: Secondary | ICD-10-CM | POA: Diagnosis not present

## 2018-07-03 DIAGNOSIS — E1122 Type 2 diabetes mellitus with diabetic chronic kidney disease: Secondary | ICD-10-CM | POA: Diagnosis not present

## 2018-07-03 DIAGNOSIS — Z95810 Presence of automatic (implantable) cardiac defibrillator: Secondary | ICD-10-CM | POA: Diagnosis not present

## 2018-07-05 DIAGNOSIS — R5381 Other malaise: Secondary | ICD-10-CM | POA: Diagnosis not present

## 2018-07-05 DIAGNOSIS — T1490XA Injury, unspecified, initial encounter: Secondary | ICD-10-CM | POA: Diagnosis not present

## 2018-07-06 ENCOUNTER — Other Ambulatory Visit: Payer: Self-pay

## 2018-07-06 ENCOUNTER — Ambulatory Visit (INDEPENDENT_AMBULATORY_CARE_PROVIDER_SITE_OTHER): Payer: Medicare Other

## 2018-07-06 DIAGNOSIS — Z9181 History of falling: Secondary | ICD-10-CM | POA: Diagnosis not present

## 2018-07-06 DIAGNOSIS — Z466 Encounter for fitting and adjustment of urinary device: Secondary | ICD-10-CM | POA: Diagnosis not present

## 2018-07-06 DIAGNOSIS — I130 Hypertensive heart and chronic kidney disease with heart failure and stage 1 through stage 4 chronic kidney disease, or unspecified chronic kidney disease: Secondary | ICD-10-CM | POA: Diagnosis not present

## 2018-07-06 DIAGNOSIS — K219 Gastro-esophageal reflux disease without esophagitis: Secondary | ICD-10-CM | POA: Diagnosis not present

## 2018-07-06 DIAGNOSIS — N183 Chronic kidney disease, stage 3 (moderate): Secondary | ICD-10-CM | POA: Diagnosis not present

## 2018-07-06 DIAGNOSIS — R338 Other retention of urine: Secondary | ICD-10-CM | POA: Diagnosis not present

## 2018-07-06 DIAGNOSIS — N390 Urinary tract infection, site not specified: Secondary | ICD-10-CM | POA: Diagnosis not present

## 2018-07-06 DIAGNOSIS — Z95810 Presence of automatic (implantable) cardiac defibrillator: Secondary | ICD-10-CM

## 2018-07-06 DIAGNOSIS — G4730 Sleep apnea, unspecified: Secondary | ICD-10-CM | POA: Diagnosis not present

## 2018-07-06 DIAGNOSIS — I5022 Chronic systolic (congestive) heart failure: Secondary | ICD-10-CM | POA: Diagnosis not present

## 2018-07-06 DIAGNOSIS — Z794 Long term (current) use of insulin: Secondary | ICD-10-CM | POA: Diagnosis not present

## 2018-07-06 DIAGNOSIS — K5900 Constipation, unspecified: Secondary | ICD-10-CM | POA: Diagnosis not present

## 2018-07-06 DIAGNOSIS — E1122 Type 2 diabetes mellitus with diabetic chronic kidney disease: Secondary | ICD-10-CM | POA: Diagnosis not present

## 2018-07-06 DIAGNOSIS — I429 Cardiomyopathy, unspecified: Secondary | ICD-10-CM | POA: Diagnosis not present

## 2018-07-06 DIAGNOSIS — A419 Sepsis, unspecified organism: Secondary | ICD-10-CM | POA: Diagnosis not present

## 2018-07-06 DIAGNOSIS — E1165 Type 2 diabetes mellitus with hyperglycemia: Secondary | ICD-10-CM | POA: Diagnosis not present

## 2018-07-06 DIAGNOSIS — N049 Nephrotic syndrome with unspecified morphologic changes: Secondary | ICD-10-CM | POA: Diagnosis not present

## 2018-07-06 DIAGNOSIS — K5120 Ulcerative (chronic) proctitis without complications: Secondary | ICD-10-CM | POA: Diagnosis not present

## 2018-07-06 DIAGNOSIS — I5020 Unspecified systolic (congestive) heart failure: Secondary | ICD-10-CM | POA: Diagnosis not present

## 2018-07-06 NOTE — Progress Notes (Signed)
EPIC Encounter for ICM Monitoring  Patient Name: Frank Hoover is a 68 y.o. male Date: 07/06/2018 Primary Care Physican: Sinda Du, MD Primary Cardiologist:Branch Electrophysiologist:Taylor Nephrologist:Dr Befakadu PreviousWeight:239lbs 06/08/2018 Weight:240 lbs 06/19/2018 Weight: ~241 lbs    Spoke with nephew Frank Hoover.  He reports since pt was discharged from [facility] he is doing better but does have general weakness. Patient unable to weigh at home.    [employer] Heart Failure Indexis 11 which is a decrease from 16 suggesting impedance is starting to return to normal.  Prescribed: Furosemide40 mg take 1 tablet every morning and 0.5 tablet (20 mg total) every evening (decreased dosage by Dr Harl Bowie 4/10 in response to elevated Creatinine. All meds are in blister packs)  Labs: 06/10/2018 Creatinine 3.38, BUN 33, Potassium 3.5, Sodium 136, GFR 18-21 05/18/2018 Creatinine2.52, BUN32, Potassium3.5, Sodium139, GFR26-30, Care Everywhere 11/25/2019Creatinine 2.88, BUN45, Potassium3.7, Sodium134, PQZ30-07, Care Everywhere  08/06/2019Creatinine 2.40, BUN34, Potassium3.9, Sodium139, MAU63-33, Care Everywhere  04/26/2019Creatinine 2.32, BUN37, Potassium3.8, Sodium141, LKT62-56, Care Everywhere A complete set of results can be found in Results Review.  Recommendations: Advised to limit or avoid restaurant foods since the are typically high in salt.   Follow-up plan: ICM clinic phone appointment on5/15/2020 recheck for 5/18visit with Dr.Branch.  Will continue to monitor for alerts.  Copy of ICM check sent to Dr. Flossie Dibble Dr Harl Bowie.     [place], RN 07/06/2018 3:30 PM

## 2018-07-07 DIAGNOSIS — R339 Retention of urine, unspecified: Secondary | ICD-10-CM | POA: Diagnosis not present

## 2018-07-07 DIAGNOSIS — N184 Chronic kidney disease, stage 4 (severe): Secondary | ICD-10-CM | POA: Diagnosis not present

## 2018-07-07 DIAGNOSIS — E1121 Type 2 diabetes mellitus with diabetic nephropathy: Secondary | ICD-10-CM | POA: Diagnosis not present

## 2018-07-07 DIAGNOSIS — A419 Sepsis, unspecified organism: Secondary | ICD-10-CM | POA: Diagnosis not present

## 2018-07-08 DIAGNOSIS — K219 Gastro-esophageal reflux disease without esophagitis: Secondary | ICD-10-CM | POA: Diagnosis not present

## 2018-07-08 DIAGNOSIS — E1165 Type 2 diabetes mellitus with hyperglycemia: Secondary | ICD-10-CM | POA: Diagnosis not present

## 2018-07-08 DIAGNOSIS — I10 Essential (primary) hypertension: Secondary | ICD-10-CM | POA: Diagnosis not present

## 2018-07-08 DIAGNOSIS — G4730 Sleep apnea, unspecified: Secondary | ICD-10-CM | POA: Diagnosis not present

## 2018-07-08 DIAGNOSIS — Z95810 Presence of automatic (implantable) cardiac defibrillator: Secondary | ICD-10-CM | POA: Diagnosis not present

## 2018-07-08 DIAGNOSIS — E1122 Type 2 diabetes mellitus with diabetic chronic kidney disease: Secondary | ICD-10-CM | POA: Diagnosis not present

## 2018-07-08 DIAGNOSIS — Z466 Encounter for fitting and adjustment of urinary device: Secondary | ICD-10-CM | POA: Diagnosis not present

## 2018-07-08 DIAGNOSIS — K5900 Constipation, unspecified: Secondary | ICD-10-CM | POA: Diagnosis not present

## 2018-07-08 DIAGNOSIS — R338 Other retention of urine: Secondary | ICD-10-CM | POA: Diagnosis not present

## 2018-07-08 DIAGNOSIS — N390 Urinary tract infection, site not specified: Secondary | ICD-10-CM | POA: Diagnosis not present

## 2018-07-08 DIAGNOSIS — I130 Hypertensive heart and chronic kidney disease with heart failure and stage 1 through stage 4 chronic kidney disease, or unspecified chronic kidney disease: Secondary | ICD-10-CM | POA: Diagnosis not present

## 2018-07-08 DIAGNOSIS — A419 Sepsis, unspecified organism: Secondary | ICD-10-CM | POA: Diagnosis not present

## 2018-07-08 DIAGNOSIS — N183 Chronic kidney disease, stage 3 (moderate): Secondary | ICD-10-CM | POA: Diagnosis not present

## 2018-07-08 DIAGNOSIS — I5020 Unspecified systolic (congestive) heart failure: Secondary | ICD-10-CM | POA: Diagnosis not present

## 2018-07-08 DIAGNOSIS — N049 Nephrotic syndrome with unspecified morphologic changes: Secondary | ICD-10-CM | POA: Diagnosis not present

## 2018-07-08 DIAGNOSIS — Z794 Long term (current) use of insulin: Secondary | ICD-10-CM | POA: Diagnosis not present

## 2018-07-08 DIAGNOSIS — K5120 Ulcerative (chronic) proctitis without complications: Secondary | ICD-10-CM | POA: Diagnosis not present

## 2018-07-08 DIAGNOSIS — I429 Cardiomyopathy, unspecified: Secondary | ICD-10-CM | POA: Diagnosis not present

## 2018-07-08 DIAGNOSIS — Z9181 History of falling: Secondary | ICD-10-CM | POA: Diagnosis not present

## 2018-07-09 DIAGNOSIS — E1165 Type 2 diabetes mellitus with hyperglycemia: Secondary | ICD-10-CM | POA: Diagnosis not present

## 2018-07-09 DIAGNOSIS — N390 Urinary tract infection, site not specified: Secondary | ICD-10-CM | POA: Diagnosis not present

## 2018-07-09 DIAGNOSIS — I429 Cardiomyopathy, unspecified: Secondary | ICD-10-CM | POA: Diagnosis not present

## 2018-07-09 DIAGNOSIS — R338 Other retention of urine: Secondary | ICD-10-CM | POA: Diagnosis not present

## 2018-07-09 DIAGNOSIS — N049 Nephrotic syndrome with unspecified morphologic changes: Secondary | ICD-10-CM | POA: Diagnosis not present

## 2018-07-09 DIAGNOSIS — K5120 Ulcerative (chronic) proctitis without complications: Secondary | ICD-10-CM | POA: Diagnosis not present

## 2018-07-09 DIAGNOSIS — I130 Hypertensive heart and chronic kidney disease with heart failure and stage 1 through stage 4 chronic kidney disease, or unspecified chronic kidney disease: Secondary | ICD-10-CM | POA: Diagnosis not present

## 2018-07-09 DIAGNOSIS — Z794 Long term (current) use of insulin: Secondary | ICD-10-CM | POA: Diagnosis not present

## 2018-07-09 DIAGNOSIS — N183 Chronic kidney disease, stage 3 (moderate): Secondary | ICD-10-CM | POA: Diagnosis not present

## 2018-07-09 DIAGNOSIS — E1122 Type 2 diabetes mellitus with diabetic chronic kidney disease: Secondary | ICD-10-CM | POA: Diagnosis not present

## 2018-07-09 DIAGNOSIS — K219 Gastro-esophageal reflux disease without esophagitis: Secondary | ICD-10-CM | POA: Diagnosis not present

## 2018-07-09 DIAGNOSIS — A419 Sepsis, unspecified organism: Secondary | ICD-10-CM | POA: Diagnosis not present

## 2018-07-09 DIAGNOSIS — Z95810 Presence of automatic (implantable) cardiac defibrillator: Secondary | ICD-10-CM | POA: Diagnosis not present

## 2018-07-09 DIAGNOSIS — Z9181 History of falling: Secondary | ICD-10-CM | POA: Diagnosis not present

## 2018-07-09 DIAGNOSIS — K5900 Constipation, unspecified: Secondary | ICD-10-CM | POA: Diagnosis not present

## 2018-07-09 DIAGNOSIS — G4730 Sleep apnea, unspecified: Secondary | ICD-10-CM | POA: Diagnosis not present

## 2018-07-09 DIAGNOSIS — I5020 Unspecified systolic (congestive) heart failure: Secondary | ICD-10-CM | POA: Diagnosis not present

## 2018-07-09 DIAGNOSIS — Z466 Encounter for fitting and adjustment of urinary device: Secondary | ICD-10-CM | POA: Diagnosis not present

## 2018-07-13 DIAGNOSIS — N049 Nephrotic syndrome with unspecified morphologic changes: Secondary | ICD-10-CM | POA: Diagnosis not present

## 2018-07-13 DIAGNOSIS — K5900 Constipation, unspecified: Secondary | ICD-10-CM | POA: Diagnosis not present

## 2018-07-13 DIAGNOSIS — A419 Sepsis, unspecified organism: Secondary | ICD-10-CM | POA: Diagnosis not present

## 2018-07-13 DIAGNOSIS — K5120 Ulcerative (chronic) proctitis without complications: Secondary | ICD-10-CM | POA: Diagnosis not present

## 2018-07-13 DIAGNOSIS — N390 Urinary tract infection, site not specified: Secondary | ICD-10-CM | POA: Diagnosis not present

## 2018-07-13 DIAGNOSIS — Z466 Encounter for fitting and adjustment of urinary device: Secondary | ICD-10-CM | POA: Diagnosis not present

## 2018-07-13 DIAGNOSIS — G4730 Sleep apnea, unspecified: Secondary | ICD-10-CM | POA: Diagnosis not present

## 2018-07-13 DIAGNOSIS — K219 Gastro-esophageal reflux disease without esophagitis: Secondary | ICD-10-CM | POA: Diagnosis not present

## 2018-07-13 DIAGNOSIS — Z95810 Presence of automatic (implantable) cardiac defibrillator: Secondary | ICD-10-CM | POA: Diagnosis not present

## 2018-07-13 DIAGNOSIS — I5020 Unspecified systolic (congestive) heart failure: Secondary | ICD-10-CM | POA: Diagnosis not present

## 2018-07-13 DIAGNOSIS — I130 Hypertensive heart and chronic kidney disease with heart failure and stage 1 through stage 4 chronic kidney disease, or unspecified chronic kidney disease: Secondary | ICD-10-CM | POA: Diagnosis not present

## 2018-07-13 DIAGNOSIS — N183 Chronic kidney disease, stage 3 (moderate): Secondary | ICD-10-CM | POA: Diagnosis not present

## 2018-07-13 DIAGNOSIS — Z794 Long term (current) use of insulin: Secondary | ICD-10-CM | POA: Diagnosis not present

## 2018-07-13 DIAGNOSIS — E1165 Type 2 diabetes mellitus with hyperglycemia: Secondary | ICD-10-CM | POA: Diagnosis not present

## 2018-07-13 DIAGNOSIS — I429 Cardiomyopathy, unspecified: Secondary | ICD-10-CM | POA: Diagnosis not present

## 2018-07-13 DIAGNOSIS — Z9181 History of falling: Secondary | ICD-10-CM | POA: Diagnosis not present

## 2018-07-13 DIAGNOSIS — E1122 Type 2 diabetes mellitus with diabetic chronic kidney disease: Secondary | ICD-10-CM | POA: Diagnosis not present

## 2018-07-13 DIAGNOSIS — R338 Other retention of urine: Secondary | ICD-10-CM | POA: Diagnosis not present

## 2018-07-15 ENCOUNTER — Encounter: Payer: Self-pay | Admitting: Internal Medicine

## 2018-07-15 ENCOUNTER — Telehealth: Payer: Self-pay | Admitting: Gastroenterology

## 2018-07-15 DIAGNOSIS — I5020 Unspecified systolic (congestive) heart failure: Secondary | ICD-10-CM | POA: Diagnosis not present

## 2018-07-15 DIAGNOSIS — N390 Urinary tract infection, site not specified: Secondary | ICD-10-CM | POA: Diagnosis not present

## 2018-07-15 DIAGNOSIS — K5120 Ulcerative (chronic) proctitis without complications: Secondary | ICD-10-CM | POA: Diagnosis not present

## 2018-07-15 DIAGNOSIS — Z794 Long term (current) use of insulin: Secondary | ICD-10-CM | POA: Diagnosis not present

## 2018-07-15 DIAGNOSIS — Z466 Encounter for fitting and adjustment of urinary device: Secondary | ICD-10-CM | POA: Diagnosis not present

## 2018-07-15 DIAGNOSIS — I429 Cardiomyopathy, unspecified: Secondary | ICD-10-CM | POA: Diagnosis not present

## 2018-07-15 DIAGNOSIS — K5900 Constipation, unspecified: Secondary | ICD-10-CM | POA: Diagnosis not present

## 2018-07-15 DIAGNOSIS — Z95810 Presence of automatic (implantable) cardiac defibrillator: Secondary | ICD-10-CM | POA: Diagnosis not present

## 2018-07-15 DIAGNOSIS — E1165 Type 2 diabetes mellitus with hyperglycemia: Secondary | ICD-10-CM | POA: Diagnosis not present

## 2018-07-15 DIAGNOSIS — I130 Hypertensive heart and chronic kidney disease with heart failure and stage 1 through stage 4 chronic kidney disease, or unspecified chronic kidney disease: Secondary | ICD-10-CM | POA: Diagnosis not present

## 2018-07-15 DIAGNOSIS — N049 Nephrotic syndrome with unspecified morphologic changes: Secondary | ICD-10-CM | POA: Diagnosis not present

## 2018-07-15 DIAGNOSIS — N183 Chronic kidney disease, stage 3 (moderate): Secondary | ICD-10-CM | POA: Diagnosis not present

## 2018-07-15 DIAGNOSIS — K219 Gastro-esophageal reflux disease without esophagitis: Secondary | ICD-10-CM | POA: Diagnosis not present

## 2018-07-15 DIAGNOSIS — E1122 Type 2 diabetes mellitus with diabetic chronic kidney disease: Secondary | ICD-10-CM | POA: Diagnosis not present

## 2018-07-15 DIAGNOSIS — A419 Sepsis, unspecified organism: Secondary | ICD-10-CM | POA: Diagnosis not present

## 2018-07-15 DIAGNOSIS — Z9181 History of falling: Secondary | ICD-10-CM | POA: Diagnosis not present

## 2018-07-15 DIAGNOSIS — G4730 Sleep apnea, unspecified: Secondary | ICD-10-CM | POA: Diagnosis not present

## 2018-07-15 DIAGNOSIS — R338 Other retention of urine: Secondary | ICD-10-CM | POA: Diagnosis not present

## 2018-07-15 NOTE — Telephone Encounter (Signed)
Please arrange for telehealth visit in 6 weeks with AB or [facility] who saw patient while in hospital, reason for visit is Hospital f/u.

## 2018-07-15 NOTE — Telephone Encounter (Signed)
SCHEDULED AND LETTER SENT  °

## 2018-07-16 ENCOUNTER — Telehealth: Payer: Self-pay | Admitting: Cardiology

## 2018-07-16 DIAGNOSIS — N390 Urinary tract infection, site not specified: Secondary | ICD-10-CM | POA: Diagnosis not present

## 2018-07-16 DIAGNOSIS — E1122 Type 2 diabetes mellitus with diabetic chronic kidney disease: Secondary | ICD-10-CM | POA: Diagnosis not present

## 2018-07-16 DIAGNOSIS — N049 Nephrotic syndrome with unspecified morphologic changes: Secondary | ICD-10-CM | POA: Diagnosis not present

## 2018-07-16 DIAGNOSIS — A419 Sepsis, unspecified organism: Secondary | ICD-10-CM | POA: Diagnosis not present

## 2018-07-16 DIAGNOSIS — G4730 Sleep apnea, unspecified: Secondary | ICD-10-CM | POA: Diagnosis not present

## 2018-07-16 DIAGNOSIS — Z95810 Presence of automatic (implantable) cardiac defibrillator: Secondary | ICD-10-CM | POA: Diagnosis not present

## 2018-07-16 DIAGNOSIS — K219 Gastro-esophageal reflux disease without esophagitis: Secondary | ICD-10-CM | POA: Diagnosis not present

## 2018-07-16 DIAGNOSIS — I5020 Unspecified systolic (congestive) heart failure: Secondary | ICD-10-CM | POA: Diagnosis not present

## 2018-07-16 DIAGNOSIS — K5120 Ulcerative (chronic) proctitis without complications: Secondary | ICD-10-CM | POA: Diagnosis not present

## 2018-07-16 DIAGNOSIS — R338 Other retention of urine: Secondary | ICD-10-CM | POA: Diagnosis not present

## 2018-07-16 DIAGNOSIS — E1165 Type 2 diabetes mellitus with hyperglycemia: Secondary | ICD-10-CM | POA: Diagnosis not present

## 2018-07-16 DIAGNOSIS — Z466 Encounter for fitting and adjustment of urinary device: Secondary | ICD-10-CM | POA: Diagnosis not present

## 2018-07-16 DIAGNOSIS — Z9181 History of falling: Secondary | ICD-10-CM | POA: Diagnosis not present

## 2018-07-16 DIAGNOSIS — I429 Cardiomyopathy, unspecified: Secondary | ICD-10-CM | POA: Diagnosis not present

## 2018-07-16 DIAGNOSIS — I130 Hypertensive heart and chronic kidney disease with heart failure and stage 1 through stage 4 chronic kidney disease, or unspecified chronic kidney disease: Secondary | ICD-10-CM | POA: Diagnosis not present

## 2018-07-16 DIAGNOSIS — N183 Chronic kidney disease, stage 3 (moderate): Secondary | ICD-10-CM | POA: Diagnosis not present

## 2018-07-16 DIAGNOSIS — Z794 Long term (current) use of insulin: Secondary | ICD-10-CM | POA: Diagnosis not present

## 2018-07-16 DIAGNOSIS — K5900 Constipation, unspecified: Secondary | ICD-10-CM | POA: Diagnosis not present

## 2018-07-16 NOTE — Telephone Encounter (Signed)
Virtual Visit Pre-Appointment Phone Call  "(Name), I am calling you today to discuss your upcoming appointment. We are currently trying to limit exposure to the virus that causes COVID-19 by seeing patients at home rather than in the office."  1. "What is the BEST phone number to call the day of the visit?" - include this in appointment notes  2. Do you have or have access to (through a family member/friend) a smartphone with video capability that we can use for your visit?" a. If yes - list this number in appt notes as cell (if different from BEST phone #) and list the appointment type as a VIDEO visit in appointment notes b. If no - list the appointment type as a PHONE visit in appointment notes  3. Confirm consent - "In the setting of the current Covid19 crisis, you are scheduled for a (phone or video) visit with your provider on (date) at (time).  Just as we do with many in-office visits, in order for you to participate in this visit, we must obtain consent.  If you'd like, I can send this to your mychart (if signed up) or email for you to review.  Otherwise, I can obtain your verbal consent now.  All virtual visits are billed to your insurance company just like a normal visit would be.  By agreeing to a virtual visit, we'd like you to understand that the technology does not allow for your provider to perform an examination, and thus may limit your provider's ability to fully assess your condition. If your provider identifies any concerns that need to be evaluated in person, we will make arrangements to do so.  Finally, though the technology is pretty good, we cannot assure that it will always work on either your or our end, and in the setting of a video visit, we may have to convert it to a phone-only visit.  In either situation, we cannot ensure that we have a secure connection.  Are you willing to proceed?" STAFF: Did the patient verbally acknowledge consent to telehealth visit? Document  YES/NO here: yes  4. Advise patient to be prepared - "Two hours prior to your appointment, go ahead and check your blood pressure, pulse, oxygen saturation, and your weight (if you have the equipment to check those) and write them all down. When your visit starts, your provider will ask you for this information. If you have an Apple Watch or Kardia device, please plan to have heart rate information ready on the day of your appointment. Please have a pen and paper handy nearby the day of the visit as well."  5. Give patient instructions for MyChart download to smartphone OR Doximity/Doxy.me as below if video visit (depending on what platform provider is using)  6. Inform patient they will receive a phone call 15 minutes prior to their appointment time (may be from unknown caller ID) so they should be prepared to answer    TELEPHONE CALL NOTE  Frank Hoover has been deemed a candidate for a follow-up tele-health visit to limit community exposure during the Covid-19 pandemic. I spoke with the patient via phone to ensure availability of phone/video source, confirm preferred email & phone number, and discuss instructions and expectations.  I reminded Frank Hoover to be prepared with any vital sign and/or heart rhythm information that could potentially be obtained via home monitoring, at the time of his visit. I reminded Frank Hoover to expect a phone call prior to  his visit.  Frank Hoover 07/16/2018 11:04 AM   INSTRUCTIONS FOR DOWNLOADING THE MYCHART APP TO SMARTPHONE  - The patient must first make sure to have activated MyChart and know their login information - If Apple, go to [employer] and type in MyChart in the search bar and download the app. If Android, ask patient to go to [employer] and type in [employer] in the search bar and download the app. The app is free but as with any other app downloads, their phone may require them to verify saved payment information or  Apple/Android password.  - The patient will need to then log into the app with their MyChart username and password, and select [facility] as their healthcare provider to link the account. When it is time for your visit, go to the MyChart app, find appointments, and click Begin Video Visit. Be sure to Select Allow for your device to access the Microphone and Camera for your visit. You will then be connected, and your provider will be with you shortly.  **If they have any issues connecting, or need assistance please contact MyChart service desk [phone])**  **If using a computer, in order to ensure the best quality for their visit they will need to use either of the following Internet Browsers: [employer], or Google Chrome**  IF USING DOXIMITY or DOXY.ME - The patient will receive a link just prior to their visit by text.     FULL LENGTH CONSENT FOR TELE-HEALTH VISIT   I hereby voluntarily request, consent and authorize [facility] and its employed or contracted physicians, physician assistants, nurse practitioners or other licensed health care professionals (the Practitioner), to provide me with telemedicine health care services (the Services") as deemed necessary by the treating Practitioner. I acknowledge and consent to receive the Services by the Practitioner via telemedicine. I understand that the telemedicine visit will involve communicating with the Practitioner through live audiovisual communication technology and the disclosure of certain medical information by electronic transmission. I acknowledge that I have been given the opportunity to request an in-person assessment or other available alternative prior to the telemedicine visit and am voluntarily participating in the telemedicine visit.  I understand that I have the right to withhold or withdraw my consent to the use of telemedicine in the course of my care at any time, without affecting my right to future care  or treatment, and that the Practitioner or I may terminate the telemedicine visit at any time. I understand that I have the right to inspect all information obtained and/or recorded in the course of the telemedicine visit and may receive copies of available information for a reasonable fee.  I understand that some of the potential risks of receiving the Services via telemedicine include:   Delay or interruption in medical evaluation due to technological equipment failure or disruption;  Information transmitted may not be sufficient (e.g. poor resolution of images) to allow for appropriate medical decision making by the Practitioner; and/or   In rare instances, security protocols could fail, causing a breach of personal health information.  Furthermore, I acknowledge that it is my responsibility to provide information about my medical history, conditions and care that is complete and accurate to the best of my ability. I acknowledge that Practitioner's advice, recommendations, and/or decision may be based on factors not within their control, such as incomplete or inaccurate data provided by me or distortions of diagnostic images or specimens that may result from electronic transmissions. I  understand that the practice of medicine is not an exact science and that Practitioner makes no warranties or guarantees regarding treatment outcomes. I acknowledge that I will receive a copy of this consent concurrently upon execution via email to the email address I last provided but may also request a printed copy by calling the office of [facility].    I understand that my insurance will be billed for this visit.   I have read or had this consent read to me.  I understand the contents of this consent, which adequately explains the benefits and risks of the Services being provided via telemedicine.   I have been provided ample opportunity to ask questions regarding this consent and the Services and have had  my questions answered to my satisfaction.  I give my informed consent for the services to be provided through the use of telemedicine in my medical care  By participating in this telemedicine visit I agree to the above.

## 2018-07-20 ENCOUNTER — Ambulatory Visit (INDEPENDENT_AMBULATORY_CARE_PROVIDER_SITE_OTHER): Payer: Medicare Other

## 2018-07-20 ENCOUNTER — Other Ambulatory Visit: Payer: Self-pay

## 2018-07-20 ENCOUNTER — Telehealth (INDEPENDENT_AMBULATORY_CARE_PROVIDER_SITE_OTHER): Payer: Medicare Other | Admitting: Cardiology

## 2018-07-20 ENCOUNTER — Telehealth: Payer: Self-pay | Admitting: *Deleted

## 2018-07-20 ENCOUNTER — Encounter: Payer: Self-pay | Admitting: Cardiology

## 2018-07-20 VITALS — BP 185/86 | HR 78 | Ht 66.0 in | Wt 240.0 lb

## 2018-07-20 DIAGNOSIS — K5120 Ulcerative (chronic) proctitis without complications: Secondary | ICD-10-CM | POA: Diagnosis not present

## 2018-07-20 DIAGNOSIS — Z794 Long term (current) use of insulin: Secondary | ICD-10-CM | POA: Diagnosis not present

## 2018-07-20 DIAGNOSIS — A419 Sepsis, unspecified organism: Secondary | ICD-10-CM | POA: Diagnosis not present

## 2018-07-20 DIAGNOSIS — G4730 Sleep apnea, unspecified: Secondary | ICD-10-CM | POA: Diagnosis not present

## 2018-07-20 DIAGNOSIS — N183 Chronic kidney disease, stage 3 (moderate): Secondary | ICD-10-CM | POA: Diagnosis not present

## 2018-07-20 DIAGNOSIS — I5022 Chronic systolic (congestive) heart failure: Secondary | ICD-10-CM | POA: Diagnosis not present

## 2018-07-20 DIAGNOSIS — I5020 Unspecified systolic (congestive) heart failure: Secondary | ICD-10-CM | POA: Diagnosis not present

## 2018-07-20 DIAGNOSIS — N390 Urinary tract infection, site not specified: Secondary | ICD-10-CM | POA: Diagnosis not present

## 2018-07-20 DIAGNOSIS — E1165 Type 2 diabetes mellitus with hyperglycemia: Secondary | ICD-10-CM | POA: Diagnosis not present

## 2018-07-20 DIAGNOSIS — K219 Gastro-esophageal reflux disease without esophagitis: Secondary | ICD-10-CM | POA: Diagnosis not present

## 2018-07-20 DIAGNOSIS — I429 Cardiomyopathy, unspecified: Secondary | ICD-10-CM | POA: Diagnosis not present

## 2018-07-20 DIAGNOSIS — Z466 Encounter for fitting and adjustment of urinary device: Secondary | ICD-10-CM | POA: Diagnosis not present

## 2018-07-20 DIAGNOSIS — Z95810 Presence of automatic (implantable) cardiac defibrillator: Secondary | ICD-10-CM

## 2018-07-20 DIAGNOSIS — Z9181 History of falling: Secondary | ICD-10-CM | POA: Diagnosis not present

## 2018-07-20 DIAGNOSIS — I130 Hypertensive heart and chronic kidney disease with heart failure and stage 1 through stage 4 chronic kidney disease, or unspecified chronic kidney disease: Secondary | ICD-10-CM | POA: Diagnosis not present

## 2018-07-20 DIAGNOSIS — K5900 Constipation, unspecified: Secondary | ICD-10-CM | POA: Diagnosis not present

## 2018-07-20 DIAGNOSIS — E1122 Type 2 diabetes mellitus with diabetic chronic kidney disease: Secondary | ICD-10-CM | POA: Diagnosis not present

## 2018-07-20 DIAGNOSIS — I4892 Unspecified atrial flutter: Secondary | ICD-10-CM

## 2018-07-20 DIAGNOSIS — R338 Other retention of urine: Secondary | ICD-10-CM | POA: Diagnosis not present

## 2018-07-20 DIAGNOSIS — I10 Essential (primary) hypertension: Secondary | ICD-10-CM

## 2018-07-20 DIAGNOSIS — N049 Nephrotic syndrome with unspecified morphologic changes: Secondary | ICD-10-CM | POA: Diagnosis not present

## 2018-07-20 DIAGNOSIS — N184 Chronic kidney disease, stage 4 (severe): Secondary | ICD-10-CM

## 2018-07-20 MED ORDER — HYDRALAZINE HCL 100 MG PO TABS: 100.0000 mg | ORAL_TABLET | Freq: Three times a day (TID) | ORAL | 3 refills | Status: DC

## 2018-07-20 NOTE — Progress Notes (Signed)
EPIC Encounter for ICM Monitoring  Patient Name: Frank Hoover is a 69 y.o. male Date: 07/20/2018 Primary Care Physican: Sinda Du, MD Primary Cardiologist:Branch Electrophysiologist:Taylor Nephrologist:Dr Befakadu 06/08/2018 Weight:240 lbs 06/19/2018 Weight: ~241 lbs    Heart Failure questions reviewed.He reports feeling fine at this time.  Patient has difficulty walking and unable to weigh.     HeartLogic Heart Failure Indexhas decreased to 8 from 12 suggesting index is returning to threshold of 6 (which is considered normal).  Prescribed: Furosemide40 mg take 1 tablet every morning and 0.5 tablet (20 mg total) every evening.  Home health nurse will be reviewing all patients meds today, 07/20/2018 and will update on how patient is taking Furosemide.   All meds are in blister packs.  Labs: 06/10/2018 Creatinine 3.38, BUN 33, Potassium 3.5, Sodium 136, GFR 18-21 05/18/2018 Creatinine2.52, BUN32, Potassium3.5, Sodium139, JJH41-74, Care Everywhere 11/25/2019Creatinine 2.88, BUN45, Potassium3.7, Sodium134, YCX44-81, Care Everywhere  08/06/2019Creatinine 2.40, BUN34, Potassium3.9, Sodium139, EHU31-49, Care Everywhere  04/26/2019Creatinine 2.32, BUN37, Potassium3.8, Sodium141, FWY63-78, Care Everywhere A complete set of results can be found in Results Review.  Recommendations: Patient has a telehealth visit with Dr Harl Bowie today.    Follow-up plan: ICM clinic phone appointment on6/8/2020and will continue to monitor for alerts. Virtual appointment scheduled 07/20/2018 with Dr.Branch.  Copy of ICM check sent to Dr. Flossie Dibble Dr Harl Bowie (pt has telehealth visit today).     [place], RN 07/20/2018 8:53 AM

## 2018-07-20 NOTE — Addendum Note (Signed)
Addended by: Merlene Laughter on: 07/20/2018 10:41 AM   Modules accepted: Orders

## 2018-07-20 NOTE — Patient Instructions (Addendum)
Medication Instructions:   Your physician has recommended you make the following change in your medication:   Increase hydralazine to 100 mg by mouth three times daily  Continue all other medications the same  Labwork:  NONE  Testing/Procedures:  NONE  Follow-Up:  Your physician recommends that you schedule a follow-up appointment in: 3 weeks  Any Other Special Instructions Will Be Listed Below (If Applicable).  If you need a refill on your cardiac medications before your next appointment, please call your pharmacy.

## 2018-07-20 NOTE — Telephone Encounter (Signed)
Discussed AVS-verbalized understanding

## 2018-07-20 NOTE — Progress Notes (Signed)
Virtual Visit via Video Note   This visit type was conducted due to national recommendations for restrictions regarding the COVID-19 Pandemic (e.g. social distancing) in an effort to limit this patient's exposure and mitigate transmission in our community.  Due to his co-morbid illnesses, this patient is at least at moderate risk for complications without adequate follow up.  This format is felt to be most appropriate for this patient at this time.  All issues noted in this document were discussed and addressed.  A limited physical exam was performed with this format.  Please refer to the patient's chart for his consent to telehealth for [facility].   Date:  07/20/2018   ID:  Frank Hoover, DOB 1950-11-15, MRN [id number]  Patient Location: Home Provider Location: Office  PCP:  Sinda Du, MD  Cardiologist:  Carlyle Dolly, MD  Electrophysiologist:  None   Evaluation Performed:  Follow-Up Visit  Chief Complaint:  Follow up visit  History of Present Illness:    Frank Hoover is a 68 y.o. male with seen today for follow up of the following medical problems.   1. 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    - no recent palpitations. Compliant with meds     2. Chronic systolic HF - echo 03/930 LVEF 25%, diffuse hypokinesis. Mod RV dysfunction.  - new diagnosis 08/2015 during admission with sepsis - thought possible tachcyardia mediated, however he has not had an ischemic evaluation - echo Jan 2018 LVEF 30-35%.  - ICD followed by EP .medical therapy limited due to CKD and previous low bp's    - some recent fluid overload, issues controlling without worsening his renal function. Had uptrend in Cr by 06/10/18 labs. We increased his lasix to 37m bid for a few days, then back down to 425min AM and  206mn PM after Cr trended up  - weight today 240 lbs. Last visit 11/2017 246 lbs  - no recent SOB or DOE. No recent edema - compliant with meds    3, HTN - previous issues with low bp's - recently has had some elevated bp's noted today by home check, but also during 06/2018 admission.   4. CKDIV - followed by Dr BefHinda Lenisith recent visit and labs  5. Urinary retention - issues with urinary retention during 06/2018 admission, discharged with foley catheter.      The patient does not have symptoms concerning for COVID-19 infection (fever, chills, cough, or new shortness of breath).    Past Medical History:  Diagnosis Date  . Adenomatous colon polyp 10/30/09   Serrated adenoma removed during Colonoscopy   . AICD (automatic cardioverter/defibrillator) present 06/11/2016  . Arthritis    "legs" (09/27/2015)  . Atrial flutter with rapid ventricular response ([facility]7/26/2017  . CHF (congestive heart failure) ([facility] . CKD (chronic kidney disease) stage 3, GFR 30-59 ml/min (HCC)   . COPD (chronic obstructive pulmonary disease) ([facility] . Diverticulosis of colon   . Dysrhythmia   . Essential hypertension   . Gastroparesis   . GERD (gastroesophageal reflux disease)   . Gout   . Hyperlipemia   . Hypertension   . OA (osteoarthritis)   . Type 2 diabetes mellitus ([facility]  Past Surgical History:  Procedure Laterality Date  . CARDIOVERSION N/A 08/22/2015   Procedure: CARDIOVERSION;  Surgeon:  Larey Dresser, MD;  Location: [facility];  Service: Cardiovascular;  Laterality: N/A;  . CARDIOVERSION N/A 09/01/2015   Procedure: CARDIOVERSION;  Surgeon: Lelon Perla, MD;  Location: [facility];  Service: Cardiovascular;  Laterality: N/A;  . CATARACT EXTRACTION W/ INTRAOCULAR LENS  IMPLANT, BILATERAL Bilateral   . COLONOSCOPY  10/30/09   DTO:IZTI-WPYKD diverticulum/serrated adenoma from ICV, next colonoscopy due 10/2012  . COLONOSCOPY N/A 09/18/2012   XIP:JASNKNL mucosa that was  seen appeared normal, however most of it was not seen due to be very poor prep  . COLONOSCOPY N/A 04/08/2013   ZJQ:BHALPFX polyp removed/inadequate preparation  . COLONOSCOPY N/A 06/22/2014   Procedure: COLONOSCOPY;  Surgeon: Daneil Dolin, MD;  [facility];  Service: Endoscopy;  Laterality: N/A;  115   . ELECTROPHYSIOLOGIC STUDY N/A 09/27/2015   Procedure: SVT Ablation;  Surgeon: Evans Lance, MD;  Location: [facility];  Service: Cardiovascular;  Laterality: N/A;  . EP IMPLANTABLE DEVICE  06/11/2016  . ESOPHAGOGASTRODUODENOSCOPY  10/30/09   TKW:IOXBDZ   . FLEXIBLE SIGMOIDOSCOPY N/A 08/25/2015   Procedure: FLEXIBLE SIGMOIDOSCOPY;  Surgeon: Jerene Bears, MD;  Location[facility] ENDOSCOPY;  Service: Endoscopy;  Laterality: N/A;  . ICD IMPLANT N/A 06/11/2016   Procedure: ICD Implant;  Surgeon: Evans Lance, MD;  Location: [facility] CV LAB;  Service: Cardiovascular;  Laterality: N/A;  . TEE WITHOUT CARDIOVERSION N/A 08/22/2015   Procedure: TRANSESOPHAGEAL ECHOCARDIOGRAM (TEE);  Surgeon: Larey Dresser, MD;  Location: [facility];  Service: Cardiovascular;  Laterality: N/A;  . TEE WITHOUT CARDIOVERSION  09/27/2015   Procedure: Transesophageal Echocardiogram (Tee);  Surgeon: Evans Lance, MD;  Location: [facility] CV LAB;  Service: Cardiovascular;;  . TONSILLECTOMY  1950s/1960s     No outpatient medications have been marked as taking for the 07/20/18 encounter (Telemedicine) with Arnoldo Lenis, MD.     Allergies:   Patient has no known allergies.   Social History   Tobacco Use  . Smoking status: Former Smoker    Packs/day: 1.00    Years: 4.00    Pack years: 4.00    Types: Cigarettes    Last attempt to quit: 08/26/2010    Years since quitting: 7.9  . Smokeless tobacco: Never Used  Substance Use Topics  . Alcohol use: No    Alcohol/week: 0.0 standard drinks    Comment: "stopped in 2012 when I quit smoking"  . Drug use: No     Family Hx: The patient's family  history includes COPD in his father; Diabetes in his mother; Hypertension in his mother. There is no history of Colon cancer.  ROS:   Please see the history of present illness.     All other systems reviewed and are negative.   Prior CV studies:   The following studies were reviewed today:  Jan 2018 echo Study Conclusions  - Left ventricle: The cavity size was normal. Systolic function was moderately to severely reduced. The estimated ejection fraction was in the range of 30% to 35%. Diffuse hypokinesis. Doppler parameters are consistent with abnormal left ventricular relaxation (grade 1 diastolic dysfunction). Mild to moderate LVH. - Mitral valve: There was mild regurgitation. - Left atrium: The atrium was mildly dilated. - Right ventricle: Systolic function was mildly to moderately reduced. - Atrial septum: No defect or patent foramen ovale was identified.  Labs/Other Tests and Data Reviewed:    EKG:  No ECG reviewed.  Recent Labs: 06/27/2018: ALT 17; Hemoglobin 12.0; Magnesium 2.1; Platelets  171 07/01/2018: BUN 26; Creatinine, Ser 1.62; Potassium 3.3; Sodium 141   Recent Lipid Panel Lab Results  Component Value Date/Time   CHOL 79 08/15/2015 04:22 AM   TRIG 38 08/15/2015 04:22 AM   HDL 34 (L) 08/15/2015 04:22 AM   CHOLHDL 2.3 08/15/2015 04:22 AM   LDLCALC 37 08/15/2015 04:22 AM    Wt Readings from Last 3 Encounters:  07/01/18 252 lb 13.9 oz (114.7 kg)  01/23/18 246 lb (111.6 kg)  11/06/17 246 lb 12.8 oz (111.9 kg)     Objective:    Vital Signs:  Ht 5' 6"  (1.676 m)   BMI 40.81 kg/m    Today's Vitals   07/20/18 0856  BP: (!) 185/86  Pulse: 78  Weight: 240 lb (108.9 kg)  Height: 5' 6"  (1.676 m)   Body mass index is 38.74 kg/m.  ASSESSMENT & PLAN:    1. Aflutter  - s/p ablation, followed by EP. -doing well, continue to monitor.   2. Chronic systolic HF - medical therapy limited by CKDIII and prior low bp's, though more recently bp's  have been elevated -Renal function continues to prohibit cath.  -increase hydralazine to 162m tid.  - fluid wise seems to be doing ok, continue lasix 493min am and 2047mn pm. Perhaps urinary retention was playing some role in fluid issues a month ago  3. HTN - above goal, increase hydral to 100m101md - if additional agent needed consider norvasc  4 CKD IV - follow with renal - AKI during recent admission due to urinary retension, has foley catheter with imrpvoed Cr at discharge - continue to monitor on diuretics.   COVID-19 Education: The signs and symptoms of COVID-19 were discussed with the patient and how to seek care for testing (follow up with PCP or arrange E-visit).  The importance of social distancing was discussed today.  Time:   Today, I have spent 18 minutes with the patient with telehealth technology discussing the above problems.     Medication Adjustments/Labs and Tests Ordered: Current medicines are reviewed at length with the patient today.  Concerns regarding medicines are outlined above.   Tests Ordered: No orders of the defined types were placed in this encounter.   Medication Changes: No orders of the defined types were placed in this encounter.   Disposition:  Follow up 6 weeks  Signed, BranCarlyle Dolly  07/20/2018 9:59 AM    [facility]

## 2018-07-21 DIAGNOSIS — I429 Cardiomyopathy, unspecified: Secondary | ICD-10-CM | POA: Diagnosis not present

## 2018-07-21 DIAGNOSIS — R338 Other retention of urine: Secondary | ICD-10-CM | POA: Diagnosis not present

## 2018-07-21 DIAGNOSIS — K5900 Constipation, unspecified: Secondary | ICD-10-CM | POA: Diagnosis not present

## 2018-07-21 DIAGNOSIS — Z95810 Presence of automatic (implantable) cardiac defibrillator: Secondary | ICD-10-CM | POA: Diagnosis not present

## 2018-07-21 DIAGNOSIS — G4730 Sleep apnea, unspecified: Secondary | ICD-10-CM | POA: Diagnosis not present

## 2018-07-21 DIAGNOSIS — Z466 Encounter for fitting and adjustment of urinary device: Secondary | ICD-10-CM | POA: Diagnosis not present

## 2018-07-21 DIAGNOSIS — Z794 Long term (current) use of insulin: Secondary | ICD-10-CM | POA: Diagnosis not present

## 2018-07-21 DIAGNOSIS — N183 Chronic kidney disease, stage 3 (moderate): Secondary | ICD-10-CM | POA: Diagnosis not present

## 2018-07-21 DIAGNOSIS — E1165 Type 2 diabetes mellitus with hyperglycemia: Secondary | ICD-10-CM | POA: Diagnosis not present

## 2018-07-21 DIAGNOSIS — K5120 Ulcerative (chronic) proctitis without complications: Secondary | ICD-10-CM | POA: Diagnosis not present

## 2018-07-21 DIAGNOSIS — K219 Gastro-esophageal reflux disease without esophagitis: Secondary | ICD-10-CM | POA: Diagnosis not present

## 2018-07-21 DIAGNOSIS — A419 Sepsis, unspecified organism: Secondary | ICD-10-CM | POA: Diagnosis not present

## 2018-07-21 DIAGNOSIS — N390 Urinary tract infection, site not specified: Secondary | ICD-10-CM | POA: Diagnosis not present

## 2018-07-21 DIAGNOSIS — E1122 Type 2 diabetes mellitus with diabetic chronic kidney disease: Secondary | ICD-10-CM | POA: Diagnosis not present

## 2018-07-21 DIAGNOSIS — N049 Nephrotic syndrome with unspecified morphologic changes: Secondary | ICD-10-CM | POA: Diagnosis not present

## 2018-07-21 DIAGNOSIS — I130 Hypertensive heart and chronic kidney disease with heart failure and stage 1 through stage 4 chronic kidney disease, or unspecified chronic kidney disease: Secondary | ICD-10-CM | POA: Diagnosis not present

## 2018-07-21 DIAGNOSIS — Z9181 History of falling: Secondary | ICD-10-CM | POA: Diagnosis not present

## 2018-07-21 DIAGNOSIS — I5020 Unspecified systolic (congestive) heart failure: Secondary | ICD-10-CM | POA: Diagnosis not present

## 2018-07-22 DIAGNOSIS — I5020 Unspecified systolic (congestive) heart failure: Secondary | ICD-10-CM | POA: Diagnosis not present

## 2018-07-22 DIAGNOSIS — K219 Gastro-esophageal reflux disease without esophagitis: Secondary | ICD-10-CM | POA: Diagnosis not present

## 2018-07-22 DIAGNOSIS — R338 Other retention of urine: Secondary | ICD-10-CM | POA: Diagnosis not present

## 2018-07-22 DIAGNOSIS — A419 Sepsis, unspecified organism: Secondary | ICD-10-CM | POA: Diagnosis not present

## 2018-07-22 DIAGNOSIS — K5120 Ulcerative (chronic) proctitis without complications: Secondary | ICD-10-CM | POA: Diagnosis not present

## 2018-07-22 DIAGNOSIS — N390 Urinary tract infection, site not specified: Secondary | ICD-10-CM | POA: Diagnosis not present

## 2018-07-22 DIAGNOSIS — I429 Cardiomyopathy, unspecified: Secondary | ICD-10-CM | POA: Diagnosis not present

## 2018-07-22 DIAGNOSIS — Z466 Encounter for fitting and adjustment of urinary device: Secondary | ICD-10-CM | POA: Diagnosis not present

## 2018-07-22 DIAGNOSIS — Z9181 History of falling: Secondary | ICD-10-CM | POA: Diagnosis not present

## 2018-07-22 DIAGNOSIS — K5900 Constipation, unspecified: Secondary | ICD-10-CM | POA: Diagnosis not present

## 2018-07-22 DIAGNOSIS — Z95810 Presence of automatic (implantable) cardiac defibrillator: Secondary | ICD-10-CM | POA: Diagnosis not present

## 2018-07-22 DIAGNOSIS — E1165 Type 2 diabetes mellitus with hyperglycemia: Secondary | ICD-10-CM | POA: Diagnosis not present

## 2018-07-22 DIAGNOSIS — G4730 Sleep apnea, unspecified: Secondary | ICD-10-CM | POA: Diagnosis not present

## 2018-07-22 DIAGNOSIS — E1122 Type 2 diabetes mellitus with diabetic chronic kidney disease: Secondary | ICD-10-CM | POA: Diagnosis not present

## 2018-07-22 DIAGNOSIS — N183 Chronic kidney disease, stage 3 (moderate): Secondary | ICD-10-CM | POA: Diagnosis not present

## 2018-07-22 DIAGNOSIS — N049 Nephrotic syndrome with unspecified morphologic changes: Secondary | ICD-10-CM | POA: Diagnosis not present

## 2018-07-22 DIAGNOSIS — Z794 Long term (current) use of insulin: Secondary | ICD-10-CM | POA: Diagnosis not present

## 2018-07-22 DIAGNOSIS — I130 Hypertensive heart and chronic kidney disease with heart failure and stage 1 through stage 4 chronic kidney disease, or unspecified chronic kidney disease: Secondary | ICD-10-CM | POA: Diagnosis not present

## 2018-07-23 DIAGNOSIS — Z95810 Presence of automatic (implantable) cardiac defibrillator: Secondary | ICD-10-CM | POA: Diagnosis not present

## 2018-07-23 DIAGNOSIS — A419 Sepsis, unspecified organism: Secondary | ICD-10-CM | POA: Diagnosis not present

## 2018-07-23 DIAGNOSIS — K5120 Ulcerative (chronic) proctitis without complications: Secondary | ICD-10-CM | POA: Diagnosis not present

## 2018-07-23 DIAGNOSIS — Z794 Long term (current) use of insulin: Secondary | ICD-10-CM | POA: Diagnosis not present

## 2018-07-23 DIAGNOSIS — I130 Hypertensive heart and chronic kidney disease with heart failure and stage 1 through stage 4 chronic kidney disease, or unspecified chronic kidney disease: Secondary | ICD-10-CM | POA: Diagnosis not present

## 2018-07-23 DIAGNOSIS — E1122 Type 2 diabetes mellitus with diabetic chronic kidney disease: Secondary | ICD-10-CM | POA: Diagnosis not present

## 2018-07-23 DIAGNOSIS — E1165 Type 2 diabetes mellitus with hyperglycemia: Secondary | ICD-10-CM | POA: Diagnosis not present

## 2018-07-23 DIAGNOSIS — I429 Cardiomyopathy, unspecified: Secondary | ICD-10-CM | POA: Diagnosis not present

## 2018-07-23 DIAGNOSIS — Z9181 History of falling: Secondary | ICD-10-CM | POA: Diagnosis not present

## 2018-07-23 DIAGNOSIS — R338 Other retention of urine: Secondary | ICD-10-CM | POA: Diagnosis not present

## 2018-07-23 DIAGNOSIS — G4730 Sleep apnea, unspecified: Secondary | ICD-10-CM | POA: Diagnosis not present

## 2018-07-23 DIAGNOSIS — I5020 Unspecified systolic (congestive) heart failure: Secondary | ICD-10-CM | POA: Diagnosis not present

## 2018-07-23 DIAGNOSIS — K219 Gastro-esophageal reflux disease without esophagitis: Secondary | ICD-10-CM | POA: Diagnosis not present

## 2018-07-23 DIAGNOSIS — Z466 Encounter for fitting and adjustment of urinary device: Secondary | ICD-10-CM | POA: Diagnosis not present

## 2018-07-23 DIAGNOSIS — N049 Nephrotic syndrome with unspecified morphologic changes: Secondary | ICD-10-CM | POA: Diagnosis not present

## 2018-07-23 DIAGNOSIS — N183 Chronic kidney disease, stage 3 (moderate): Secondary | ICD-10-CM | POA: Diagnosis not present

## 2018-07-23 DIAGNOSIS — K5900 Constipation, unspecified: Secondary | ICD-10-CM | POA: Diagnosis not present

## 2018-07-23 DIAGNOSIS — N390 Urinary tract infection, site not specified: Secondary | ICD-10-CM | POA: Diagnosis not present

## 2018-07-25 DIAGNOSIS — G4733 Obstructive sleep apnea (adult) (pediatric): Secondary | ICD-10-CM | POA: Diagnosis not present

## 2018-07-25 DIAGNOSIS — R0902 Hypoxemia: Secondary | ICD-10-CM | POA: Diagnosis not present

## 2018-07-27 ENCOUNTER — Inpatient Hospital Stay (HOSPITAL_COMMUNITY)
Admission: EM | Admit: 2018-07-27 | Discharge: 2018-08-02 | DRG: 872 | Disposition: A | Discharge status: Home Health | Payer: Medicare Other | Attending: Pulmonary Disease | Admitting: Pulmonary Disease

## 2018-07-27 ENCOUNTER — Encounter (HOSPITAL_COMMUNITY): Payer: Self-pay | Admitting: *Deleted

## 2018-07-27 ENCOUNTER — Emergency Department (HOSPITAL_COMMUNITY): Payer: Medicare Other

## 2018-07-27 ENCOUNTER — Other Ambulatory Visit: Payer: Self-pay

## 2018-07-27 DIAGNOSIS — M47816 Spondylosis without myelopathy or radiculopathy, lumbar region: Secondary | ICD-10-CM | POA: Diagnosis not present

## 2018-07-27 DIAGNOSIS — F79 Unspecified intellectual disabilities: Secondary | ICD-10-CM | POA: Diagnosis present

## 2018-07-27 DIAGNOSIS — E876 Hypokalemia: Secondary | ICD-10-CM | POA: Diagnosis present

## 2018-07-27 DIAGNOSIS — K5641 Fecal impaction: Secondary | ICD-10-CM | POA: Diagnosis not present

## 2018-07-27 DIAGNOSIS — E860 Dehydration: Secondary | ICD-10-CM | POA: Diagnosis not present

## 2018-07-27 DIAGNOSIS — M109 Gout, unspecified: Secondary | ICD-10-CM | POA: Diagnosis not present

## 2018-07-27 DIAGNOSIS — A4151 Sepsis due to Escherichia coli [E. coli]: Secondary | ICD-10-CM | POA: Diagnosis not present

## 2018-07-27 DIAGNOSIS — N1832 Chronic kidney disease, stage 3b: Secondary | ICD-10-CM | POA: Diagnosis present

## 2018-07-27 DIAGNOSIS — K5730 Diverticulosis of large intestine without perforation or abscess without bleeding: Secondary | ICD-10-CM | POA: Diagnosis present

## 2018-07-27 DIAGNOSIS — R338 Other retention of urine: Secondary | ICD-10-CM | POA: Diagnosis not present

## 2018-07-27 DIAGNOSIS — K219 Gastro-esophageal reflux disease without esophagitis: Secondary | ICD-10-CM | POA: Diagnosis present

## 2018-07-27 DIAGNOSIS — I471 Supraventricular tachycardia: Secondary | ICD-10-CM | POA: Diagnosis present

## 2018-07-27 DIAGNOSIS — Z95810 Presence of automatic (implantable) cardiac defibrillator: Secondary | ICD-10-CM

## 2018-07-27 DIAGNOSIS — R000 Tachycardia, unspecified: Secondary | ICD-10-CM | POA: Diagnosis not present

## 2018-07-27 DIAGNOSIS — Z833 Family history of diabetes mellitus: Secondary | ICD-10-CM

## 2018-07-27 DIAGNOSIS — Z79891 Long term (current) use of opiate analgesic: Secondary | ICD-10-CM

## 2018-07-27 DIAGNOSIS — R197 Diarrhea, unspecified: Secondary | ICD-10-CM | POA: Diagnosis not present

## 2018-07-27 DIAGNOSIS — Z825 Family history of asthma and other chronic lower respiratory diseases: Secondary | ICD-10-CM

## 2018-07-27 DIAGNOSIS — I248 Other forms of acute ischemic heart disease: Secondary | ICD-10-CM | POA: Diagnosis present

## 2018-07-27 DIAGNOSIS — L89151 Pressure ulcer of sacral region, stage 1: Secondary | ICD-10-CM | POA: Diagnosis not present

## 2018-07-27 DIAGNOSIS — L8990 Pressure ulcer of unspecified site, unspecified stage: Secondary | ICD-10-CM | POA: Diagnosis present

## 2018-07-27 DIAGNOSIS — R319 Hematuria, unspecified: Secondary | ICD-10-CM | POA: Diagnosis not present

## 2018-07-27 DIAGNOSIS — I130 Hypertensive heart and chronic kidney disease with heart failure and stage 1 through stage 4 chronic kidney disease, or unspecified chronic kidney disease: Secondary | ICD-10-CM | POA: Diagnosis not present

## 2018-07-27 DIAGNOSIS — R531 Weakness: Secondary | ICD-10-CM | POA: Diagnosis not present

## 2018-07-27 DIAGNOSIS — E1122 Type 2 diabetes mellitus with diabetic chronic kidney disease: Secondary | ICD-10-CM | POA: Diagnosis not present

## 2018-07-27 DIAGNOSIS — I5022 Chronic systolic (congestive) heart failure: Secondary | ICD-10-CM | POA: Diagnosis present

## 2018-07-27 DIAGNOSIS — Z03818 Encounter for observation for suspected exposure to other biological agents ruled out: Secondary | ICD-10-CM | POA: Diagnosis not present

## 2018-07-27 DIAGNOSIS — E785 Hyperlipidemia, unspecified: Secondary | ICD-10-CM | POA: Diagnosis present

## 2018-07-27 DIAGNOSIS — N183 Chronic kidney disease, stage 3 unspecified: Secondary | ICD-10-CM | POA: Diagnosis present

## 2018-07-27 DIAGNOSIS — N179 Acute kidney failure, unspecified: Secondary | ICD-10-CM | POA: Diagnosis not present

## 2018-07-27 DIAGNOSIS — Z8249 Family history of ischemic heart disease and other diseases of the circulatory system: Secondary | ICD-10-CM

## 2018-07-27 DIAGNOSIS — N390 Urinary tract infection, site not specified: Secondary | ICD-10-CM | POA: Diagnosis present

## 2018-07-27 DIAGNOSIS — R0902 Hypoxemia: Secondary | ICD-10-CM | POA: Diagnosis not present

## 2018-07-27 DIAGNOSIS — Z87891 Personal history of nicotine dependence: Secondary | ICD-10-CM

## 2018-07-27 DIAGNOSIS — R5381 Other malaise: Secondary | ICD-10-CM | POA: Diagnosis present

## 2018-07-27 DIAGNOSIS — J449 Chronic obstructive pulmonary disease, unspecified: Secondary | ICD-10-CM | POA: Diagnosis present

## 2018-07-27 DIAGNOSIS — N401 Enlarged prostate with lower urinary tract symptoms: Secondary | ICD-10-CM | POA: Diagnosis present

## 2018-07-27 DIAGNOSIS — Z794 Long term (current) use of insulin: Secondary | ICD-10-CM

## 2018-07-27 DIAGNOSIS — Z1159 Encounter for screening for other viral diseases: Secondary | ICD-10-CM

## 2018-07-27 LAB — COMPREHENSIVE METABOLIC PANEL
ALT: 16 U/L (ref 0–44)
AST: 27 U/L (ref 15–41)
Albumin: 4.2 g/dL (ref 3.5–5.0)
Alkaline Phosphatase: 65 U/L (ref 38–126)
Anion gap: 19 — ABNORMAL HIGH (ref 5–15)
BUN: 48 mg/dL — ABNORMAL HIGH (ref 8–23)
CO2: 20 mmol/L — ABNORMAL LOW (ref 22–32)
Calcium: 9.4 mg/dL (ref 8.9–10.3)
Chloride: 92 mmol/L — ABNORMAL LOW (ref 98–111)
Creatinine, Ser: 2.98 mg/dL — ABNORMAL HIGH (ref 0.61–1.24)
GFR calc Af Amer: 24 mL/min — ABNORMAL LOW (ref >60)
GFR calc non Af Amer: 21 mL/min — ABNORMAL LOW (ref >60)
Glucose, Bld: 338 mg/dL — ABNORMAL HIGH (ref 70–99)
Potassium: 3.7 mmol/L (ref 3.5–5.1)
Sodium: 131 mmol/L — ABNORMAL LOW (ref 135–145)
Total Bilirubin: 1 mg/dL (ref 0.3–1.2)
Total Protein: 7.7 g/dL (ref 6.5–8.1)

## 2018-07-27 LAB — CBC WITH DIFFERENTIAL/PLATELET
Abs Immature Granulocytes: 0.08 10*3/uL — ABNORMAL HIGH (ref 0.00–0.07)
Basophils Absolute: 0 10*3/uL (ref 0.0–0.1)
Basophils Relative: 0 %
Eosinophils Absolute: 0.1 10*3/uL (ref 0.0–0.5)
Eosinophils Relative: 0 %
HCT: 41.8 % (ref 39.0–52.0)
Hemoglobin: 14 g/dL (ref 13.0–17.0)
Immature Granulocytes: 0 %
Lymphocytes Relative: 6 %
Lymphs Abs: 1.1 10*3/uL (ref 0.7–4.0)
MCH: 31 pg (ref 26.0–34.0)
MCHC: 33.5 g/dL (ref 30.0–36.0)
MCV: 92.5 fL (ref 80.0–100.0)
Monocytes Absolute: 1.6 10*3/uL — ABNORMAL HIGH (ref 0.1–1.0)
Monocytes Relative: 9 %
Neutro Abs: 15.2 10*3/uL — ABNORMAL HIGH (ref 1.7–7.7)
Neutrophils Relative %: 85 %
Platelets: 274 10*3/uL (ref 150–400)
RBC: 4.52 MIL/uL (ref 4.22–5.81)
RDW: 15.1 % (ref 11.5–15.5)
WBC: 18.1 10*3/uL — ABNORMAL HIGH (ref 4.0–10.5)
nRBC: 0 % (ref 0.0–0.2)

## 2018-07-27 LAB — RAPID URINE DRUG SCREEN, HOSP PERFORMED
Amphetamines: NOT DETECTED
Barbiturates: NOT DETECTED
Benzodiazepines: POSITIVE — AB
Cocaine: NOT DETECTED
Opiates: POSITIVE — AB
Tetrahydrocannabinol: NOT DETECTED

## 2018-07-27 LAB — URINALYSIS, ROUTINE W REFLEX MICROSCOPIC
Glucose, UA: NEGATIVE mg/dL
Ketones, ur: NEGATIVE mg/dL
Nitrite: NEGATIVE
Protein, ur: 100 mg/dL — AB
Specific Gravity, Urine: 1.021 (ref 1.005–1.030)
WBC, UA: >50 WBC/hpf — ABNORMAL HIGH (ref 0–5)
pH: 6 (ref 5.0–8.0)

## 2018-07-27 LAB — TROPONIN I: Troponin I: 0.26 ng/mL (ref <0.03)

## 2018-07-27 LAB — PROTIME-INR
INR: 1.1 (ref 0.8–1.2)
Prothrombin Time: 13.6 seconds (ref 11.4–15.2)

## 2018-07-27 LAB — ETHANOL: Alcohol, Ethyl (B): <10 mg/dL (ref <10)

## 2018-07-27 LAB — C DIFFICILE QUICK SCREEN W PCR REFLEX
C Diff antigen: NEGATIVE
C Diff interpretation: NOT DETECTED
C Diff toxin: NEGATIVE

## 2018-07-27 LAB — LACTIC ACID, PLASMA: Lactic Acid, Venous: 3.1 mmol/L (ref 0.5–1.9)

## 2018-07-27 LAB — POC OCCULT BLOOD, ED: Fecal Occult Bld: NEGATIVE

## 2018-07-27 MED ORDER — CIPROFLOXACIN IN D5W 400 MG/200ML IV SOLN
400.0000 mg | Freq: Once | INTRAVENOUS | Status: AC
  Administered 2018-07-28: 01:00:00 400 mg via INTRAVENOUS
  Filled 2018-07-27: qty 200

## 2018-07-27 MED ORDER — SODIUM CHLORIDE 0.9 % IV BOLUS
1000.0000 mL | Freq: Once | INTRAVENOUS | Status: AC
  Administered 2018-07-27: 1000 mL via INTRAVENOUS

## 2018-07-27 MED ORDER — SODIUM CHLORIDE 0.9 % IV BOLUS
1000.0000 mL | Freq: Once | INTRAVENOUS | Status: AC
  Administered 2018-07-27: 22:00:00 1000 mL via INTRAVENOUS

## 2018-07-27 NOTE — ED Notes (Signed)
CRITICAL VALUE ALERT  Critical Value: Lactic Acid 3.1 Date & Time Notied:07/27/18@2207  Provider Notified: Thurnell Garbe Orders Received/Actions taken:None yet

## 2018-07-27 NOTE — ED Notes (Addendum)
Date and time results received: 07/27/18 10:31 PM  (use smartphrase ".now" to insert current time)  Test: troponin Critical Value: 0.26  Name of Provider Notified: [place]  Orders Received? Or Actions Taken?: none at this time

## 2018-07-27 NOTE — ED Triage Notes (Signed)
Pt brought in by [facility] for c/o generalized weakness and altered loc; family reported to ems that pt has been more lethargic today; ems administered O2 at 2L via [place] due to O2 sats dropping while en route; pt c/o abdominal pain and states he has had some diarrhea

## 2018-07-28 ENCOUNTER — Other Ambulatory Visit: Payer: Self-pay

## 2018-07-28 DIAGNOSIS — I5022 Chronic systolic (congestive) heart failure: Secondary | ICD-10-CM | POA: Diagnosis present

## 2018-07-28 DIAGNOSIS — L89151 Pressure ulcer of sacral region, stage 1: Secondary | ICD-10-CM | POA: Diagnosis present

## 2018-07-28 DIAGNOSIS — Z87891 Personal history of nicotine dependence: Secondary | ICD-10-CM | POA: Diagnosis not present

## 2018-07-28 DIAGNOSIS — N390 Urinary tract infection, site not specified: Secondary | ICD-10-CM

## 2018-07-28 DIAGNOSIS — M47816 Spondylosis without myelopathy or radiculopathy, lumbar region: Secondary | ICD-10-CM | POA: Diagnosis present

## 2018-07-28 DIAGNOSIS — E876 Hypokalemia: Secondary | ICD-10-CM | POA: Diagnosis present

## 2018-07-28 DIAGNOSIS — J449 Chronic obstructive pulmonary disease, unspecified: Secondary | ICD-10-CM | POA: Diagnosis present

## 2018-07-28 DIAGNOSIS — I471 Supraventricular tachycardia: Secondary | ICD-10-CM | POA: Diagnosis present

## 2018-07-28 DIAGNOSIS — K219 Gastro-esophageal reflux disease without esophagitis: Secondary | ICD-10-CM | POA: Diagnosis present

## 2018-07-28 DIAGNOSIS — Z1159 Encounter for screening for other viral diseases: Secondary | ICD-10-CM | POA: Diagnosis not present

## 2018-07-28 DIAGNOSIS — E860 Dehydration: Secondary | ICD-10-CM | POA: Diagnosis present

## 2018-07-28 DIAGNOSIS — A4151 Sepsis due to Escherichia coli [E. coli]: Secondary | ICD-10-CM | POA: Diagnosis present

## 2018-07-28 DIAGNOSIS — E1122 Type 2 diabetes mellitus with diabetic chronic kidney disease: Secondary | ICD-10-CM | POA: Diagnosis present

## 2018-07-28 DIAGNOSIS — M109 Gout, unspecified: Secondary | ICD-10-CM | POA: Diagnosis present

## 2018-07-28 DIAGNOSIS — N179 Acute kidney failure, unspecified: Secondary | ICD-10-CM | POA: Diagnosis present

## 2018-07-28 DIAGNOSIS — F79 Unspecified intellectual disabilities: Secondary | ICD-10-CM | POA: Diagnosis present

## 2018-07-28 DIAGNOSIS — I130 Hypertensive heart and chronic kidney disease with heart failure and stage 1 through stage 4 chronic kidney disease, or unspecified chronic kidney disease: Secondary | ICD-10-CM | POA: Diagnosis present

## 2018-07-28 DIAGNOSIS — E785 Hyperlipidemia, unspecified: Secondary | ICD-10-CM | POA: Diagnosis present

## 2018-07-28 DIAGNOSIS — K5641 Fecal impaction: Secondary | ICD-10-CM | POA: Diagnosis present

## 2018-07-28 DIAGNOSIS — R338 Other retention of urine: Secondary | ICD-10-CM | POA: Diagnosis present

## 2018-07-28 DIAGNOSIS — R531 Weakness: Secondary | ICD-10-CM | POA: Diagnosis not present

## 2018-07-28 DIAGNOSIS — I248 Other forms of acute ischemic heart disease: Secondary | ICD-10-CM | POA: Diagnosis present

## 2018-07-28 DIAGNOSIS — N183 Chronic kidney disease, stage 3 (moderate): Secondary | ICD-10-CM | POA: Diagnosis present

## 2018-07-28 DIAGNOSIS — A419 Sepsis, unspecified organism: Secondary | ICD-10-CM | POA: Diagnosis not present

## 2018-07-28 DIAGNOSIS — K5730 Diverticulosis of large intestine without perforation or abscess without bleeding: Secondary | ICD-10-CM | POA: Diagnosis present

## 2018-07-28 DIAGNOSIS — N401 Enlarged prostate with lower urinary tract symptoms: Secondary | ICD-10-CM | POA: Diagnosis present

## 2018-07-28 LAB — CBC
HCT: 37.5 % — ABNORMAL LOW (ref 39.0–52.0)
Hemoglobin: 12.3 g/dL — ABNORMAL LOW (ref 13.0–17.0)
MCH: 31.4 pg (ref 26.0–34.0)
MCHC: 32.8 g/dL (ref 30.0–36.0)
MCV: 95.7 fL (ref 80.0–100.0)
Platelets: 230 10*3/uL (ref 150–400)
RBC: 3.92 MIL/uL — ABNORMAL LOW (ref 4.22–5.81)
RDW: 15.3 % (ref 11.5–15.5)
WBC: 12 10*3/uL — ABNORMAL HIGH (ref 4.0–10.5)
nRBC: 0 % (ref 0.0–0.2)

## 2018-07-28 LAB — BASIC METABOLIC PANEL
Anion gap: 16 — ABNORMAL HIGH (ref 5–15)
BUN: 47 mg/dL — ABNORMAL HIGH (ref 8–23)
CO2: 23 mmol/L (ref 22–32)
Calcium: 8.5 mg/dL — ABNORMAL LOW (ref 8.9–10.3)
Chloride: 95 mmol/L — ABNORMAL LOW (ref 98–111)
Creatinine, Ser: 2.84 mg/dL — ABNORMAL HIGH (ref 0.61–1.24)
GFR calc Af Amer: 26 mL/min — ABNORMAL LOW (ref >60)
GFR calc non Af Amer: 22 mL/min — ABNORMAL LOW (ref >60)
Glucose, Bld: 340 mg/dL — ABNORMAL HIGH (ref 70–99)
Potassium: 3.9 mmol/L (ref 3.5–5.1)
Sodium: 134 mmol/L — ABNORMAL LOW (ref 135–145)

## 2018-07-28 LAB — TROPONIN I
Troponin I: 0.32 ng/mL (ref <0.03)
Troponin I: 0.33 ng/mL (ref <0.03)
Troponin I: 0.29 ng/mL (ref <0.03)

## 2018-07-28 LAB — SARS CORONAVIRUS 2 BY RT PCR (HOSPITAL ORDER, PERFORMED IN ~~LOC~~ HOSPITAL LAB): SARS Coronavirus 2: NEGATIVE

## 2018-07-28 LAB — LACTIC ACID, PLASMA
Lactic Acid, Venous: 3.9 mmol/L (ref 0.5–1.9)
Lactic Acid, Venous: 3.7 mmol/L (ref 0.5–1.9)

## 2018-07-28 LAB — MRSA PCR SCREENING: MRSA by PCR: NEGATIVE

## 2018-07-28 MED ORDER — SODIUM CHLORIDE 0.9 % IV BOLUS
500.0000 mL | Freq: Once | INTRAVENOUS | Status: AC
  Administered 2018-07-28: 05:00:00 500 mL via INTRAVENOUS

## 2018-07-28 MED ORDER — ACETAMINOPHEN 325 MG PO TABS: 650.0000 mg | ORAL_TABLET | Freq: Four times a day (QID) | ORAL | Status: DC | PRN

## 2018-07-28 MED ORDER — ONDANSETRON HCL 4 MG/2ML IJ SOLN: 4.0000 mg | Freq: Four times a day (QID) | INTRAMUSCULAR | Status: DC | PRN

## 2018-07-28 MED ORDER — CIPROFLOXACIN IN D5W 400 MG/200ML IV SOLN
400.0000 mg | INTRAVENOUS | Status: DC
  Administered 2018-07-28: 21:00:00 400 mg via INTRAVENOUS
  Filled 2018-07-28: qty 200

## 2018-07-28 MED ORDER — SODIUM CHLORIDE 0.9 % IV BOLUS
1000.0000 mL | Freq: Once | INTRAVENOUS | Status: AC
  Administered 2018-07-28: 01:00:00 1000 mL via INTRAVENOUS

## 2018-07-28 MED ORDER — ACETAMINOPHEN 650 MG RE SUPP: 650.0000 mg | Freq: Four times a day (QID) | RECTAL | Status: DC | PRN

## 2018-07-28 MED ORDER — SODIUM CHLORIDE 0.9 % IV SOLN
INTRAVENOUS | Status: DC
  Administered 2018-07-28 (×3): via INTRAVENOUS

## 2018-07-28 MED ORDER — CIPROFLOXACIN IN D5W 400 MG/200ML IV SOLN: 400.0000 mg | Freq: Two times a day (BID) | INTRAVENOUS | Status: DC

## 2018-07-28 MED ORDER — ORAL CARE MOUTH RINSE
15.0000 mL | Freq: Two times a day (BID) | OROMUCOSAL | Status: DC
  Administered 2018-07-28: 08:00:00 15 mL via OROMUCOSAL

## 2018-07-28 MED ORDER — ONDANSETRON HCL 4 MG PO TABS: 4.0000 mg | ORAL_TABLET | Freq: Four times a day (QID) | ORAL | Status: DC | PRN

## 2018-07-28 NOTE — Progress Notes (Signed)
Inpatient Diabetes Program Recommendations  AACE/ADA: New Consensus Statement on Inpatient Glycemic Control (2015)  Target Ranges:  Prepandial:   less than 140 mg/dL      Peak postprandial:   less than 180 mg/dL (1-2 hours)      Critically ill patients:  140 - 180 mg/dL   Lab Results  Component Value Date   GLUCAP 165 (H) 07/01/2018   HGBA1C 7.8 (H) 08/14/2015    Review of Glycemic Control Results for Frank Hoover, Frank Hoover (MRN [id number]) as of 07/28/2018 10:41  Ref. Range 07/27/2018 21:34 07/28/2018 03:09  Glucose Latest Ref Range: 70 - 99 mg/dL 338 (H) 340 (H)   Diabetes history: DM2 Outpatient Diabetes medications: Lantus 20 units bid + Humalog 18-20 units bid meal coverage Current orders for Inpatient glycemic control: None  Inpatient Diabetes Program Recommendations:   -Lantus insulin 10 units bid (50% basal home insulin dose) -Novolog 5 units tid meal coverage if eats 50% -Glycemic control order set with Novolog sensitive correction tid + hs 0-5 units -A1c to determine prehospital glycemic control  Thank you, Bethena Roys E. Zollie Clemence, RN, MSN, CDE  Diabetes Coordinator Inpatient Glycemic Control Team Team Pager [phone] (8am-5pm) 07/28/2018 10:44 AM

## 2018-07-28 NOTE — H&P (Signed)
History and Physical    Frank Hoover RXY:[id number] DOB: Jul 24, 1950 DOA: 07/27/2018  PCP: Sinda Du, MD  Patient coming from: Home  Chief Complaint: Could not urinate  HPI: Frank Hoover is a 68 y.o. male with medical history significant of COPD, CHF, chronic kidney disease, diabetes, hypertension comes in because he was constipated and then could not urinate.  He started given himself a several enemas at home but he started having problems with diarrhea.  He denies any fevers he denies any abdominal pain.  Patient found to have a UTI with acute kidney injury and referred for admission for such.  Review of Systems: As per HPI otherwise 10 point review of systems negative possibly unreliable.   Past Medical History:  Diagnosis Date  . Adenomatous colon polyp 10/30/09   Serrated adenoma removed during Colonoscopy   . AICD (automatic cardioverter/defibrillator) present 06/11/2016  . Arthritis    "legs" (09/27/2015)  . Atrial flutter with rapid ventricular response ([place]) 09/27/2015  . CHF (congestive heart failure) ([place])   . CKD (chronic kidney disease) stage 3, GFR 30-59 ml/min (HCC)   . COPD (chronic obstructive pulmonary disease) ([place])   . Diverticulosis of colon   . Dysrhythmia   . Essential hypertension   . Gastroparesis   . GERD (gastroesophageal reflux disease)   . Gout   . Hyperlipemia   . Hypertension   . OA (osteoarthritis)   . Type 2 diabetes mellitus ([place])     Past Surgical History:  Procedure Laterality Date  . CARDIOVERSION N/A 08/22/2015   Procedure: CARDIOVERSION;  Surgeon: Larey Dresser, MD;  Location: [facility];  Service: Cardiovascular;  Laterality: N/A;  . CARDIOVERSION N/A 09/01/2015   Procedure: CARDIOVERSION;  Surgeon: Lelon Perla, MD;  Location: [facility];  Service: Cardiovascular;  Laterality: N/A;  . CATARACT EXTRACTION W/ INTRAOCULAR LENS  IMPLANT, BILATERAL Bilateral   . COLONOSCOPY  10/30/09   QKM:MNOT-RRNHA  diverticulum/serrated adenoma from ICV, next colonoscopy due 10/2012  . COLONOSCOPY N/A 09/18/2012   FBX:UXYBFXO mucosa that was seen appeared normal, however most of it was not seen due to be very poor prep  . COLONOSCOPY N/A 04/08/2013   VAN:VBTYOMA polyp removed/inadequate preparation  . COLONOSCOPY N/A 06/22/2014   Procedure: COLONOSCOPY;  Surgeon: Daneil Dolin, MD;  [facility];  Service: Endoscopy;  Laterality: N/A;  115   . ELECTROPHYSIOLOGIC STUDY N/A 09/27/2015   Procedure: SVT Ablation;  Surgeon: Evans Lance, MD;  Location: [facility];  Service: Cardiovascular;  Laterality: N/A;  . EP IMPLANTABLE DEVICE  06/11/2016  . ESOPHAGOGASTRODUODENOSCOPY  10/30/09   YOK:HTXHFS   . FLEXIBLE SIGMOIDOSCOPY N/A 08/25/2015   Procedure: FLEXIBLE SIGMOIDOSCOPY;  Surgeon: Jerene Bears, MD;  Location: [facility];  Service: Endoscopy;  Laterality: N/A;  . ICD IMPLANT N/A 06/11/2016   Procedure: ICD Implant;  Surgeon: Evans Lance, MD;  Location: [place] [facility];  Service: Cardiovascular;  Laterality: N/A;  . TEE WITHOUT CARDIOVERSION N/A 08/22/2015   Procedure: TRANSESOPHAGEAL ECHOCARDIOGRAM (TEE);  Surgeon: Larey Dresser, MD;  Location: [facility];  Service: Cardiovascular;  Laterality: N/A;  . TEE WITHOUT CARDIOVERSION  09/27/2015   Procedure: Transesophageal Echocardiogram (Tee);  Surgeon: Evans Lance, MD;  Location: [facility];  Service: Cardiovascular;;  . TONSILLECTOMY  1950s/1960s     reports that he quit smoking about 7 years ago. His smoking use included cigarettes. He has a 4.00 pack-year smoking history. He has never used smokeless  tobacco. He reports that he does not drink alcohol or use drugs.  No Known Allergies  Family History  Problem Relation Age of Onset  . COPD Father   . Diabetes Mother   . Hypertension Mother   . Colon cancer Neg Hx     Prior to Admission medications   Medication Sig Start Date End Date Taking? Authorizing Provider   acetaminophen (TYLENOL) 500 MG tablet Take 1,000 mg by mouth daily as needed for moderate pain or headache.     [provider]  albuterol (PROAIR HFA) 108 (90 BASE) MCG/ACT inhaler Inhale 2 puffs into the lungs every 6 (six) hours as needed for wheezing or shortness of breath.     [provider]  allopurinol (ZYLOPRIM) 300 MG tablet Take 300 mg by mouth daily.      [provider]  alum & mag hydroxide-simeth (MAALOX/MYLANTA) 200-200-20 MG/5ML suspension Take 15 mLs by mouth every 6 (six) hours as needed for indigestion or heartburn.    [provider]  atorvastatin (LIPITOR) 20 MG tablet Take 20 mg by mouth at bedtime.    [provider]  cholecalciferol (VITAMIN D) 1000 units tablet Take 1,000 Units by mouth daily.    [provider]  fluticasone (FLONASE) 50 MCG/ACT nasal spray Place 2 sprays into both nostrils daily.  05/29/18   [provider]  furosemide (LASIX) 40 MG tablet Take 40 mg by mouth every morning. & 20 mg in the evening    [provider]  gabapentin (NEURONTIN) 400 MG capsule Take 400 mg by mouth daily.     [provider]  hydrALAZINE (APRESOLINE) 100 MG tablet Take 1 tablet (100 mg total) by mouth 3 (three) times daily. 07/20/18   Arnoldo Lenis, MD  HYDROcodone-acetaminophen (NORCO/VICODIN) 5-325 MG tablet Take 1 tablet by mouth every 6 (six) hours as needed. 07/01/18   [provider]  insulin lispro (HUMALOG KWIKPEN) 100 UNIT/ML KiwkPen Inject 18-20 Units into the skin as directed. Inject 2 to 18 units based on sliding scale as directed as needed for blood glucose over 200    [provider]  isosorbide mononitrate (IMDUR) 30 MG 24 hr tablet Take 1 tablet (30 mg total) by mouth daily. 05/04/18   Evans Lance, MD  LANTUS SOLOSTAR 100 UNIT/ML Solostar Pen Inject 20 Units into the skin 2 (two) times daily.  06/23/18   [provider]  linaclotide Rolan Lipa) 145 MCG CAPS  capsule Take 1 capsule (145 mcg total) by mouth daily before breakfast. 07/02/18   Sinda Du, MD  loratadine (CLARITIN) 10 MG tablet Take 10 mg by mouth daily.    [provider]  metoCLOPramide (REGLAN) 5 MG tablet Take 5 mg by mouth 3 (three) times daily before meals.    [provider]  metoprolol tartrate (LOPRESSOR) 100 MG tablet Take 100 mg by mouth 2 (two) times daily.    [provider]  Multiple Vitamin (MULTIVITAMIN WITH MINERALS) TABS tablet Take 1 tablet by mouth daily. 09/07/15   Hosie Poisson, MD  ondansetron (ZOFRAN) 4 MG tablet Take 1 tablet (4 mg total) by mouth every 8 (eight) hours as needed for nausea or vomiting. 10/17/15   Carlis Stable, NP  pantoprazole (PROTONIX) 40 MG tablet Take 1 tablet (40 mg total) by mouth daily. 09/07/15   Hosie Poisson, MD  potassium chloride SA (K-DUR) 20 MEQ tablet Take 1 tablet (20 mEq total) by mouth 2 (two) times daily. 07/01/18   Luan Pulling,  Percell Miller, MD  tamsulosin (FLOMAX) 0.4 MG CAPS capsule Take 1 capsule by mouth daily. 07/02/18   [provider]  traZODone (DESYREL) 50 MG tablet Take 0.5 tablets (25 mg total) by mouth at bedtime as needed for sleep. 07/01/18   Sinda Du, MD  vitamin C (ASCORBIC ACID) 500 MG tablet Take 500 mg by mouth 2 (two) times daily.    [provider]    Physical Exam: Vitals:   07/27/18 2230 07/27/18 2300 07/27/18 2330 07/28/18 0000  BP: 99/64 140/81 118/76 113/82  Pulse: (!) 117 89  83  Resp: 15 19 15 13   Temp:      TempSrc:      SpO2: 94% 100%  95%  Weight:      Height:          Constitutional: NAD, calm, comfortable Vitals:   07/27/18 2230 07/27/18 2300 07/27/18 2330 07/28/18 0000  BP: 99/64 140/81 118/76 113/82  Pulse: (!) 117 89  83  Resp: 15 19 15 13   Temp:      TempSrc:      SpO2: 94% 100%  95%  Weight:      Height:       Eyes: PERRL, lids and conjunctivae normal ENMT: Mucous membranes are moist. Posterior pharynx clear of any exudate or  lesions.Normal dentition.  Neck: normal, supple, no masses, no thyromegaly Respiratory: clear to auscultation bilaterally, no wheezing, no crackles. Normal respiratory effort. No accessory muscle use.  Cardiovascular: Regular rate and rhythm, no murmurs / rubs / gallops. No extremity edema. 2+ pedal pulses. No carotid bruits.  Abdomen: no tenderness, no masses palpated. No hepatosplenomegaly. Bowel sounds positive.  Musculoskeletal: no clubbing / cyanosis. No joint deformity upper and lower extremities. Good ROM, no contractures. Normal muscle tone.  Skin: no rashes, lesions, ulcers. No induration Neurologic: CN 2-12 grossly intact. Sensation intact, DTR normal. Strength 5/5 in all 4.  Psychiatric: Normal judgment and insight. Alert and oriented x 3. Normal mood.    Labs on Admission: I have personally reviewed following labs and imaging studies  CBC: Recent Labs  Lab 07/27/18 2134  WBC 18.1*  NEUTROABS 15.2*  HGB 14.0  HCT 41.8  MCV 92.5  PLT 093   Basic Metabolic Panel: Recent Labs  Lab 07/27/18 2134  NA 131*  K 3.7  CL 92*  CO2 20*  GLUCOSE 338*  BUN 48*  CREATININE 2.98*  CALCIUM 9.4   GFR: Estimated Creatinine Clearance: 28.2 mL/min (A) (by C-G formula based on SCr of 2.98 mg/dL (H)). Liver Function Tests: Recent Labs  Lab 07/27/18 2134  AST 27  ALT 16  ALKPHOS 65  BILITOT 1.0  PROT 7.7  ALBUMIN 4.2   No results for input(s): LIPASE, AMYLASE in the last 168 hours. No results for input(s): AMMONIA in the last 168 hours. Coagulation Profile: Recent Labs  Lab 07/27/18 2134  INR 1.1   Cardiac Enzymes: Recent Labs  Lab 07/27/18 2134  TROPONINI 0.26*   BNP (last 3 results) No results for input(s): PROBNP in the last 8760 hours. HbA1C: No results for input(s): HGBA1C in the last 72 hours. CBG: No results for input(s): GLUCAP in the last 168 hours. Lipid Profile: No results for input(s): CHOL, HDL, LDLCALC, TRIG, CHOLHDL, LDLDIRECT in the last 72  hours. Thyroid Function Tests: No results for input(s): TSH, T4TOTAL, FREET4, T3FREE, THYROIDAB in the last 72 hours. Anemia Panel: No results for input(s): VITAMINB12, FOLATE, FERRITIN, TIBC, IRON, RETICCTPCT in the last 72 hours. Urine analysis:  Component Value Date/Time   COLORURINE AMBER (A) 07/27/2018 2120   APPEARANCEUR CLOUDY (A) 07/27/2018 2120   LABSPEC 1.021 07/27/2018 2120   PHURINE 6.0 07/27/2018 2120   GLUCOSEU NEGATIVE 07/27/2018 2120   HGBUR SMALL (A) 07/27/2018 2120   BILIRUBINUR SMALL (A) 07/27/2018 2120   KETONESUR NEGATIVE 07/27/2018 2120   PROTEINUR 100 (A) 07/27/2018 2120   UROBILINOGEN 0.2 12/02/2013 1739   NITRITE NEGATIVE 07/27/2018 2120   LEUKOCYTESUR MODERATE (A) 07/27/2018 2120   Sepsis Labs: !!!!!!!!!!!!!!!!!!!!!!!!!!!!!!!!!!!!!!!!!!!! @LABRCNTIP (procalcitonin:4,lacticidven:4) ) Recent Results (from the past 240 hour(s))  C difficile quick scan w PCR reflex     Status: None   Collection Time: 07/27/18 10:13 PM  Result Value Ref Range Status   C Diff antigen NEGATIVE NEGATIVE Final   C Diff toxin NEGATIVE NEGATIVE Final   C Diff interpretation No C. difficile detected.  Final    Comment: Performed at [facility], [address] [place], [place] [address]  SARS Coronavirus 2 ([place] [facility]- Performed in [place] hospital lab), Hosp Order     Status: None   Collection Time: 07/27/18 11:20 PM  Result Value Ref Range Status   SARS Coronavirus 2 NEGATIVE NEGATIVE Final    Comment: (NOTE) If result is NEGATIVE SARS-CoV-2 target nucleic acids are NOT DETECTED. The SARS-CoV-2 RNA is generally detectable in upper and lower  respiratory specimens during the acute phase of infection. The lowest  concentration of SARS-CoV-2 viral copies this assay can detect is 250  copies / mL. A negative result does not preclude SARS-CoV-2 infection  and should not be used as the sole basis for treatment or other  patient management decisions.  A negative result may occur  with  improper specimen collection / handling, submission of specimen other  than nasopharyngeal swab, presence of viral mutation(s) within the  areas targeted by this assay, and inadequate number of viral copies  (<250 copies / mL). A negative result must be combined with clinical  observations, patient history, and epidemiological information. If result is POSITIVE SARS-CoV-2 target nucleic acids are DETECTED. The SARS-CoV-2 RNA is generally detectable in upper and lower  respiratory specimens dur ing the acute phase of infection.  Positive  results are indicative of active infection with SARS-CoV-2.  Clinical  correlation with patient history and other diagnostic information is  necessary to determine patient infection status.  Positive results do  not rule out bacterial infection or co-infection with other viruses. If result is PRESUMPTIVE POSTIVE SARS-CoV-2 nucleic acids MAY BE PRESENT.   A presumptive positive result was obtained on the submitted specimen  and confirmed on repeat testing.  While 2019 novel coronavirus  (SARS-CoV-2) nucleic acids may be present in the submitted sample  additional confirmatory testing may be necessary for epidemiological  and / or clinical management purposes  to differentiate between  SARS-CoV-2 and other Sarbecovirus currently known to infect humans.  If clinically indicated additional testing with an alternate test  methodology [phone]) is advised. The SARS-CoV-2 RNA is generally  detectable in upper and lower respiratory sp ecimens during the acute  phase of infection. The expected result is Negative. Fact Sheet for Patients:  StrictlyIdeas.no Fact Sheet for Healthcare Providers: BankingDealers.co.za This test is not yet approved or cleared by the [place] FDA and has been authorized for detection and/or diagnosis of SARS-CoV-2 by FDA under an Emergency Use Authorization (EUA).  This EUA  will remain in effect (meaning this test can be used) for the duration of the COVID-19 declaration under Section  564(b)(1) of the Act, 21 U.S.C. section 360bbb-3(b)(1), unless the authorization is terminated or revoked sooner. Performed at [facility], [address] [place], [place] [address]      Radiological Exams on Admission: Dg Abd Acute W/chest  Result Date: 07/28/2018 CLINICAL DATA:  Generalized weakness EXAM: DG ABDOMEN ACUTE W/ 1V CHEST COMPARISON:  06/28/2018 FINDINGS: Cardiac shadows within normal limits. 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 The lungs are clear bilaterally. Scattered large and small bowel gas is noted. No free air is seen. No obstructive changes are noted. Degenerative changes of lumbar spine are noted. IMPRESSION: No acute abnormality in the chest and abdomen. Electronically Signed   By: Inez Catalina M.D.   On: 07/28/2018 00:36   Old chart reviewed Case discussed with EDP  Assessment/Plan 54 67-year-old male with UTI urinary retention and acute kidney injury Principal Problem:   Lower urinary tract infectious disease-send off urine culture and placed on IV Cipro.  Early sepsis involved.  IV fluids.  Serial lactic acid.  Active Problems:   Acute renal failure superimposed on stage 3 chronic kidney disease (HCC)-hold all nephrotoxic substances.  Creatinine bumped up to 2.8 from baseline of around 1.8.  Foley catheter placed.  Treat infection.    COPD (chronic obstructive pulmonary disease) (HCC)-stable    SVT (supraventricular tachycardia) (HCC)-stable    Debility-noted    Chronic systolic CHF (congestive heart failure) (HCC)-currently compensated at this time holding Lasix however in the setting of acute kidney injury   ICD (implantable cardioverter-defibrillator) in place-noted  Mildly elevated troponin-asymptomatic in the setting of no chest pain likely demand ischemia serial and trend.   Med rec pending pharmacy review  DVT prophylaxis:  SCDs Code Status: Full Family Communication: None Disposition Plan: Couple days Consults called: None Admission status: Admission   Paden Senger A MD [facility]  If 7PM-7AM, please contact night-coverage [email or web address] Password [facility]  07/28/2018, 12:45 AM

## 2018-07-28 NOTE — TOC Initial Note (Signed)
Transition of Care [facility]) - Initial/Assessment Note    Patient Details  Name: Frank Hoover MRN: [id number] Date of Birth: Sep 23, 1950  Transition of [facility]) CM/SW Contact:    Errika Narvaiz Dimitri Ped, LCSW Phone Number: 07/28/2018, 10:37 PM  Clinical Narrative:   CSW in to meet with pt to discuss future discharge plans. Pt is a 68 year old [place] American male who was admitted with the following Dx:    Prior to being admitted pt resided at home with his spouse and nephew. Pt states that he is currently receiving home health services from [facility]. Pt also receives care from a nurse aid and has been doing so for the "past 2-3 years". Pt goes into detail and states that his aid provides care daily by assisting with bathing, cleaning, and household chores.       Pt has a standard walker that he uses to ambulate. Pt also reports that he ha a raised toilet seat but the right side is broken. Pt is assisted with transportation to and from medication appointments by his nephew Legrand Como        Pts goal is to return home under the care and supervision of his family and nurse aid. Pt is unsure as to which agency the nurse aid with.   TOC team will continue following pt for any discharge needs.   [facility]  Clinical Social Worker  Ph: [phone]   Expected Discharge Plan: [place] Barriers to Discharge: No Barriers Identified   Patient Goals and CMS Choice Patient states their goals for this hospitalization and ongoing recovery are:: to return home with family  CMS Medicare.gov Compare Post Acute Care list provided to:: Patient Choice offered to / list presented to : Patient  Expected Discharge Plan and Services Expected Discharge Plan: [place] In-house Referral: Clinical Social Work   Post Acute Care Choice: [place] arrangements for the past 2 months: Single Family Home                            HH Arranged: OT, PT, Nurse's Aide, RN [place] Agency: [place]        Prior Living Arrangements/Services Living arrangements for the past 2 months: Single Family Home Lives with:: Spouse, Adult Children Patient language and need for interpreter reviewed:: Yes Do you feel safe going back to the place where you live?: Yes      Need for Family Participation in Patient Care: Yes (Comment) Care giver support system in place?: Yes (comment) Current home services: Homehealth aide, Home OT, Home PT Criminal Activity/Legal Involvement Pertinent to Current Situation/Hospitalization: No - Comment as needed  Activities of Daily Living Home Assistive Devices/Equipment: Transport planner, Wheelchair ADL Screening (condition at time of admission) Patient's cognitive ability adequate to safely complete daily activities?: Yes Is the patient deaf or have difficulty hearing?: No Does the patient have difficulty seeing, even when wearing glasses/contacts?: No Does the patient have difficulty concentrating, remembering, or making decisions?: No Patient able to express need for assistance with ADLs?: Yes Does the patient have difficulty dressing or bathing?: Yes Independently performs ADLs?: No Communication: Independent Dressing (OT): Needs assistance Is this a change from baseline?: Pre-admission baseline Grooming: Independent Feeding: Independent Bathing: Needs assistance Is this a change from baseline?: Pre-admission baseline Toileting: Needs assistance Is this a change from baseline?: Pre-admission baseline In/Out Bed: Needs assistance  Is this a change from baseline?: Pre-admission baseline Walks in Home: Needs assistance Is this a change from baseline?: Pre-admission baseline Does the patient have difficulty walking or climbing stairs?: Yes Weakness of Legs: Both Weakness of Arms/Hands: Both  Permission Sought/Granted Permission sought to share information with : Case  Manager, Family Supports Permission granted to share information with : Yes, Verbal Permission Granted  Share Information with NAME: Maura Crandall [facility]; [phone]; Nephew            Emotional Assessment Appearance:: Appears stated age Attitude/Demeanor/Rapport: Engaged Affect (typically observed): Accepting, Calm, Hopeful Orientation: : Oriented to Self, Oriented to  Time, Oriented to Situation, Oriented to Place   Psych Involvement: No (comment)  Admission diagnosis:  Lower urinary tract infectious disease [N39.0] Patient Active Problem List   Diagnosis Date Noted  . Severe sepsis ([place]) 06/26/2018  . Obstipation 06/26/2018  . Acute urinary retention 06/26/2018  . Mild bilateral Hydronephrosis 06/26/2018  . CKD (chronic kidney disease) stage 3, GFR 30-59 ml/min (HCC) 06/26/2018  . NICM (nonischemic cardiomyopathy) ([place]) 06/26/2018  . ICD (implantable cardioverter-defibrillator) in place 06/26/2018  . Hypokalemia 06/26/2018  . Hyperglycemia 06/26/2018  . Proctocolitis 06/26/2018  . Lower urinary tract infectious disease 06/26/2018  . Pressure injury of skin 06/26/2018  . Chronic systolic CHF (congestive heart failure) ([place]) 06/11/2016  . Diarrhea 04/18/2016  . Anemia 10/17/2015  . Nausea and vomiting 10/17/2015  . Atrial flutter ([place]) 09/27/2015  . Pressure ulcer 09/05/2015  . Abdominal pain   . Atrial fibrillation with RVR ([place])   . HCAP (healthcare-associated pneumonia)   . Acute renal failure superimposed on stage 3 chronic kidney disease ([place])   . Metabolic encephalopathy   . Abdominal distension   . Urinary tract infection, site not specified   . Cardiogenic shock ([place])   . Acute systolic CHF (congestive heart failure) ([place])   . Uncontrolled type 2 diabetes mellitus with complication ([place])   . Urinary retention   . Bacteremia   . Ischemic colitis ([place])   . Noninfectious gastroenteritis, unspecified   . Proctosigmoiditis   . Lower GI bleed   . SVT  (supraventricular tachycardia) ([place]) 08/24/2015  . Debility   . Blood in stool   . Acute blood loss anemia   . Abnormal CT scan, colon   . Acute systolic heart failure ([place])   . Acute kidney injury ([place])   . Acute respiratory failure ([place])   . Septic shock ([place])   . Acute encephalopathy   . Atrial flutter with rapid ventricular response ([place]) 08/14/2015  . Systolic CHF ([place]) 00/92/3300  . History of colonic polyps   . Dehydration 12/05/2013  . Acute renal failure ([place]) 12/02/2013  . Sleep apnea 12/02/2013  . Hyponatremia 12/02/2013  . Acute on chronic renal failure ([place]) 12/02/2013  . COPD (chronic obstructive pulmonary disease) ([place])   . DM (diabetes mellitus) ([place])   . HTN (hypertension)   . Hyperlipemia   . CRF (chronic renal failure)   . Hepatomegaly 03/25/2013  . Dysphagia 12/06/2010  . History of adenomatous polyp of colon 09/21/2010  . Gastroparesis 09/21/2010  . Obesity 09/21/2010  . GERD (gastroesophageal reflux disease) 10/09/2009  . Constipation 10/09/2009  . ABDOMINAL PAIN-EPIGASTRIC 10/09/2009   PCP:  Sinda Du, MD Pharmacy:   [facility], [place] [address] Phone: [phone] Fax: [phone]     Social Determinants of Health (SDOH) Interventions    Readmission Risk Interventions Readmission  Risk Prevention Plan 06/29/2018  Transportation Screening Complete  PCP or Specialist Appt within 3-5 Days Complete  HRI or Home Care Consult Complete  Social Work Consult for [place] Planning/Counseling Complete  Palliative Care Screening Not Applicable  Medication Review Press photographer) Complete  Some recent data might be hidden

## 2018-07-28 NOTE — Progress Notes (Signed)
CRITICAL VALUE ALERT  Critical Value: Troponin, 0.32  Date & Time Notied:  07/28/18, 0540  Provider Notified: Shanon Brow  Orders Received/Actions taken:

## 2018-07-28 NOTE — Progress Notes (Signed)
PHARMACY NOTE:  ANTIMICROBIAL RENAL DOSAGE ADJUSTMENT  Current antimicrobial regimen includes a mismatch between antimicrobial dosage and estimated renal function.  As per policy approved by the Pharmacy & Therapeutics and Medical Executive Committees, the antimicrobial dosage will be adjusted accordingly.  Current antimicrobial dosage:  Cipro 400mg  IV q12h  Indication: UTI/sepsis  Renal Function:  Estimated Creatinine Clearance: 29.5 mL/min (A) (by C-G formula based on SCr of 2.84 mg/dL (H)). []      On intermittent HD, scheduled: []      On CRRT    Antimicrobial dosage has been changed to:  Cipro 400mg  IV q24h   Thank you for allowing pharmacy to participate in f this patient's care.  Despina Pole, Pharm. D. Clinical Pharmacist 07/28/2018 11:42 AM

## 2018-07-28 NOTE — Progress Notes (Signed)
CRITICAL VALUE ALERT  Critical Value:  Trop 0.33  Date & Time Notied:  07/28/2018 1022  Provider Notified: Manuella Ghazi, MD  Orders Received/Actions taken: awaiting further instructions

## 2018-07-28 NOTE — ED Notes (Signed)
Date and time results received: 07/28/18 0005 (use smartphrase ".now" to insert current time)  Test: lactic acid Critical Value: 3.9  Name of Provider Notified: Dr Roderic Palau  Orders Received? Or Actions Taken?: Actions Taken: no orders received.

## 2018-07-28 NOTE — Progress Notes (Signed)
CRITICAL VALUE ALERT  Critical Value: Lactic acid, 3.7  Date & Time Notied:  07/28/18, 0345  Provider Notified: Carolyne Littles Received/Actions taken:

## 2018-07-28 NOTE — ED Provider Notes (Signed)
[facility] EMERGENCY DEPARTMENT Provider Note   CSN: [id number] Arrival date & time: 07/27/18  2100    History   Chief Complaint Chief Complaint  Patient presents with  . Altered Mental Status    HPI ANOTHY Hoover is a 68 y.o. male.     Patient was sent to the emergency department because he had blood in his catheter.  Also he started with diarrhea today.  Patient was constipated and used enemas yesterday  The history is provided by the patient. No language interpreter was used.  Diarrhea  Quality:  Semi-solid Severity:  Moderate Onset quality:  Sudden Number of episodes:  Unknown Timing:  Constant Progression:  Worsening Relieved by:  Nothing Worsened by:  Nothing Ineffective treatments:  None tried Associated symptoms: no abdominal pain and no headaches   Risk factors: no recent antibiotic use     Past Medical History:  Diagnosis Date  . Adenomatous colon polyp 10/30/09   Serrated adenoma removed during Colonoscopy   . AICD (automatic cardioverter/defibrillator) present 06/11/2016  . Arthritis    "legs" (09/27/2015)  . Atrial flutter with rapid ventricular response ([place]) 09/27/2015  . CHF (congestive heart failure) ([place])   . CKD (chronic kidney disease) stage 3, GFR 30-59 ml/min (HCC)   . COPD (chronic obstructive pulmonary disease) ([place])   . Diverticulosis of colon   . Dysrhythmia   . Essential hypertension   . Gastroparesis   . GERD (gastroesophageal reflux disease)   . Gout   . Hyperlipemia   . Hypertension   . OA (osteoarthritis)   . Type 2 diabetes mellitus [facility])     Patient Active Problem List   Diagnosis Date Noted  . Severe sepsis ([place]) 06/26/2018  . Obstipation 06/26/2018  . Acute urinary retention 06/26/2018  . Mild bilateral Hydronephrosis 06/26/2018  . CKD (chronic kidney disease) stage 3, GFR 30-59 ml/min (HCC) 06/26/2018  . NICM (nonischemic cardiomyopathy) ([place]) 06/26/2018  . ICD (implantable cardioverter-defibrillator) in place  06/26/2018  . Hypokalemia 06/26/2018  . Hyperglycemia 06/26/2018  . Proctocolitis 06/26/2018  . UTI (urinary tract infection) 06/26/2018  . Pressure injury of skin 06/26/2018  . Chronic systolic CHF (congestive heart failure) ([place]) 06/11/2016  . Diarrhea 04/18/2016  . Anemia 10/17/2015  . Nausea and vomiting 10/17/2015  . Atrial flutter ([place]) 09/27/2015  . Pressure ulcer 09/05/2015  . Abdominal pain   . Atrial fibrillation with RVR ([place])   . HCAP (healthcare-associated pneumonia)   . Acute renal failure superimposed on stage 3 chronic kidney disease ([place])   . Metabolic encephalopathy   . Abdominal distension   . Urinary tract infection, site not specified   . Cardiogenic shock ([place])   . Acute systolic CHF (congestive heart failure) ([place])   . Uncontrolled type 2 diabetes mellitus with complication ([place])   . Urinary retention   . Bacteremia   . Ischemic colitis ([place])   . Noninfectious gastroenteritis, unspecified   . Proctosigmoiditis   . Lower GI bleed   . SVT (supraventricular tachycardia) ([place]) 08/24/2015  . Debility   . Blood in stool   . Acute blood loss anemia   . Abnormal CT scan, colon   . Acute systolic heart failure ([place])   . Acute kidney injury ([place])   . Acute respiratory failure ([place])   . Septic shock ([place])   . Acute encephalopathy   . Atrial flutter with rapid ventricular response ([place]) 08/14/2015  . Systolic CHF ([place]) 01/60/1093  . History of colonic polyps   .  Dehydration 12/05/2013  . Acute renal failure ([place]) 12/02/2013  . Sleep apnea 12/02/2013  . Hyponatremia 12/02/2013  . Acute on chronic renal failure ([place]) 12/02/2013  . COPD (chronic obstructive pulmonary disease) ([place])   . DM (diabetes mellitus) ([place])   . HTN (hypertension)   . Hyperlipemia   . CRF (chronic renal failure)   . Hepatomegaly 03/25/2013  . Dysphagia 12/06/2010  . History of adenomatous polyp of colon 09/21/2010  . Gastroparesis 09/21/2010  . Obesity 09/21/2010  . GERD  (gastroesophageal reflux disease) 10/09/2009  . Constipation 10/09/2009  . ABDOMINAL PAIN-EPIGASTRIC 10/09/2009    Past Surgical History:  Procedure Laterality Date  . CARDIOVERSION N/A 08/22/2015   Procedure: CARDIOVERSION;  Surgeon: Larey Dresser, MD;  Location: [place];  Service: Cardiovascular;  Laterality: N/A;  . CARDIOVERSION N/A 09/01/2015   Procedure: CARDIOVERSION;  Surgeon: Lelon Perla, MD;  Location: [place];  Service: Cardiovascular;  Laterality: N/A;  . CATARACT EXTRACTION W/ INTRAOCULAR LENS  IMPLANT, BILATERAL Bilateral   . COLONOSCOPY  10/30/09   JOA:[id number]-[id number] diverticulum/serrated adenoma from ICV, next colonoscopy due 10/2012  . COLONOSCOPY N/A 09/18/2012   SWF:UXNATFT mucosa that was seen appeared normal, however most of it was not seen due to be very poor prep  . COLONOSCOPY N/A 04/08/2013   DDU:KGURKYH polyp removed/inadequate preparation  . COLONOSCOPY N/A 06/22/2014   Procedure: COLONOSCOPY;  Surgeon: Daneil Dolin, MD;  [facility];  Service: Endoscopy;  Laterality: N/A;  115   . ELECTROPHYSIOLOGIC STUDY N/A 09/27/2015   Procedure: SVT Ablation;  Surgeon: Evans Lance, MD;  Location: [place] CV LAB;  Service: Cardiovascular;  Laterality: N/A;  . EP IMPLANTABLE DEVICE  06/11/2016  . ESOPHAGOGASTRODUODENOSCOPY  10/30/09   CWC:BJSEGB   . FLEXIBLE SIGMOIDOSCOPY N/A 08/25/2015   Procedure: FLEXIBLE SIGMOIDOSCOPY;  Surgeon: Jerene Bears, MD;  Location: [facility] ENDOSCOPY;  Service: Endoscopy;  Laterality: N/A;  . ICD IMPLANT N/A 06/11/2016   Procedure: ICD Implant;  Surgeon: Evans Lance, MD;  Location: [place] CV LAB;  Service: Cardiovascular;  Laterality: N/A;  . TEE WITHOUT CARDIOVERSION N/A 08/22/2015   Procedure: TRANSESOPHAGEAL ECHOCARDIOGRAM (TEE);  Surgeon: Larey Dresser, MD;  Location: [place];  Service: Cardiovascular;  Laterality: N/A;  . TEE WITHOUT CARDIOVERSION  09/27/2015   Procedure: Transesophageal Echocardiogram  (Tee);  Surgeon: Evans Lance, MD;  Location: [place] CV LAB;  Service: Cardiovascular;;  . TONSILLECTOMY  1950s/1960s        Home Medications    Prior to Admission medications   Medication Sig Start Date End Date Taking? Authorizing Provider  acetaminophen (TYLENOL) 500 MG tablet Take 1,000 mg by mouth daily as needed for moderate pain or headache.     [provider]  albuterol (PROAIR HFA) 108 (90 BASE) MCG/ACT inhaler Inhale 2 puffs into the lungs every 6 (six) hours as needed for wheezing or shortness of breath.     [provider]  allopurinol (ZYLOPRIM) 300 MG tablet Take 300 mg by mouth daily.      [provider]  alum & mag hydroxide-simeth (MAALOX/MYLANTA) 200-200-20 MG/5ML suspension Take 15 mLs by mouth every 6 (six) hours as needed for indigestion or heartburn.    [provider]  atorvastatin (LIPITOR) 20 MG tablet Take 20 mg by mouth at bedtime.    [provider]  cholecalciferol (VITAMIN D) 1000 units tablet Take 1,000 Units by mouth daily.    [provider]  fluticasone (FLONASE) 50 MCG/ACT  nasal spray Place 2 sprays into both nostrils daily.  05/29/18   [provider]  furosemide (LASIX) 40 MG tablet Take 40 mg by mouth every morning. & 20 mg in the evening    [provider]  gabapentin (NEURONTIN) 400 MG capsule Take 400 mg by mouth daily.     [provider]  hydrALAZINE (APRESOLINE) 100 MG tablet Take 1 tablet (100 mg total) by mouth 3 (three) times daily. 07/20/18   Arnoldo Lenis, MD  HYDROcodone-acetaminophen (NORCO/VICODIN) 5-325 MG tablet Take 1 tablet by mouth every 6 (six) hours as needed. 07/01/18   [provider]  insulin lispro (HUMALOG KWIKPEN) 100 UNIT/ML KiwkPen Inject 18-20 Units into the skin as directed. Inject 2 to 18 units based on sliding scale as directed as needed for blood glucose over 200    [provider]  isosorbide mononitrate (IMDUR)  30 MG 24 hr tablet Take 1 tablet (30 mg total) by mouth daily. 05/04/18   Evans Lance, MD  LANTUS SOLOSTAR 100 UNIT/ML Solostar Pen Inject 20 Units into the skin 2 (two) times daily.  06/23/18   [provider]  linaclotide Rolan Lipa) 145 MCG CAPS capsule Take 1 capsule (145 mcg total) by mouth daily before breakfast. 07/02/18   Sinda Du, MD  loratadine (CLARITIN) 10 MG tablet Take 10 mg by mouth daily.    [provider]  metoCLOPramide (REGLAN) 5 MG tablet Take 5 mg by mouth 3 (three) times daily before meals.    [provider]  metoprolol tartrate (LOPRESSOR) 100 MG tablet Take 100 mg by mouth 2 (two) times daily.    [provider]  Multiple Vitamin (MULTIVITAMIN WITH MINERALS) TABS tablet Take 1 tablet by mouth daily. 09/07/15   Hosie Poisson, MD  ondansetron (ZOFRAN) 4 MG tablet Take 1 tablet (4 mg total) by mouth every 8 (eight) hours as needed for nausea or vomiting. 10/17/15   Carlis Stable, NP  pantoprazole (PROTONIX) 40 MG tablet Take 1 tablet (40 mg total) by mouth daily. 09/07/15   Hosie Poisson, MD  potassium chloride SA (K-DUR) 20 MEQ tablet Take 1 tablet (20 mEq total) by mouth 2 (two) times daily. 07/01/18   Sinda Du, MD  tamsulosin (FLOMAX) 0.4 MG CAPS capsule Take 1 capsule by mouth daily. 07/02/18   [provider]  traZODone (DESYREL) 50 MG tablet Take 0.5 tablets (25 mg total) by mouth at bedtime as needed for sleep. 07/01/18   Sinda Du, MD  vitamin C (ASCORBIC ACID) 500 MG tablet Take 500 mg by mouth 2 (two) times daily.    [provider]    Family History Family History  Problem Relation Age of Onset  . COPD Father   . Diabetes Mother   . Hypertension Mother   . Colon cancer Neg Hx     Social History Social History   Tobacco Use  . Smoking status: Former Smoker    Packs/day: 1.00    Years: 4.00    Pack years: 4.00    Types: Cigarettes    Last attempt to quit: 08/26/2010    Years since quitting:  7.9  . Smokeless tobacco: Never Used  Substance Use Topics  . Alcohol use: No    Alcohol/week: 0.0 standard drinks    Comment: "stopped in 2012 when I quit smoking"  . Drug use: No     Allergies   Patient has no known allergies.   Review of Systems Review of Systems  Constitutional:  Negative for appetite change and fatigue.  HENT: Negative for congestion, ear discharge and sinus pressure.   Eyes: Negative for discharge.  Respiratory: Negative for cough.   Cardiovascular: Negative for chest pain.  Gastrointestinal: Positive for diarrhea. Negative for abdominal pain.  Genitourinary: Negative for frequency and hematuria.       Blood in his catheter  Musculoskeletal: Negative for back pain.  Skin: Negative for rash.  Neurological: Negative for seizures and headaches.  Psychiatric/Behavioral: Negative for hallucinations.     Physical Exam Updated Vital Signs BP 113/82   Pulse 83   Temp 98.9 F (37.2 C) (Oral)   Resp 13   Ht 5\' 6"  (1.676 m)   Wt 109 kg   SpO2 95%   BMI 38.79 kg/m   Physical Exam Vitals signs and nursing note reviewed.  Constitutional:      Appearance: He is well-developed.  HENT:     Head: Normocephalic.     Nose: Nose normal.  Eyes:     General: No scleral icterus.    Conjunctiva/sclera: Conjunctivae normal.  Neck:     Musculoskeletal: Neck supple.     Thyroid: No thyromegaly.  Cardiovascular:     Rate and Rhythm: Normal rate and regular rhythm.     Heart sounds: No murmur. No friction rub. No gallop.   Pulmonary:     Breath sounds: No stridor. No wheezing or rales.  Chest:     Chest wall: No tenderness.  Abdominal:     General: There is no distension.     Tenderness: There is no abdominal tenderness. There is no rebound.  Musculoskeletal: Normal range of motion.  Lymphadenopathy:     Cervical: No cervical adenopathy.  Skin:    Findings: No erythema or rash.  Neurological:     Mental Status: He is oriented to person, place, and  time.     Motor: No abnormal muscle tone.     Coordination: Coordination normal.  Psychiatric:        Behavior: Behavior normal.      ED Treatments / Results  Labs (all labs ordered are listed, but only abnormal results are displayed) Labs Reviewed  COMPREHENSIVE METABOLIC PANEL - Abnormal; Notable for the following components:      Result Value   Sodium 131 (*)    Chloride 92 (*)    CO2 20 (*)    Glucose, Bld 338 (*)    BUN 48 (*)    Creatinine, Ser 2.98 (*)    GFR calc non Af Amer 21 (*)    GFR calc Af Amer 24 (*)    Anion gap 19 (*)    All other components within normal limits  TROPONIN I - Abnormal; Notable for the following components:   Troponin I 0.26 (*)    All other components within normal limits  LACTIC ACID, PLASMA - Abnormal; Notable for the following components:   Lactic Acid, Venous 3.1 (*)    All other components within normal limits  CBC WITH DIFFERENTIAL/PLATELET - Abnormal; Notable for the following components:   WBC 18.1 (*)    Neutro Abs 15.2 (*)    Monocytes Absolute 1.6 (*)    Abs Immature Granulocytes 0.08 (*)    All other components within normal limits  URINALYSIS, ROUTINE W REFLEX MICROSCOPIC - Abnormal; Notable for the following components:   Color, Urine AMBER (*)    APPearance CLOUDY (*)    Hgb urine dipstick SMALL (*)    Bilirubin Urine SMALL (*)  Protein, ur 100 (*)    Leukocytes,Ua MODERATE (*)    WBC, UA >50 (*)    Bacteria, UA FEW (*)    All other components within normal limits  RAPID URINE DRUG SCREEN, HOSP PERFORMED - Abnormal; Notable for the following components:   Opiates POSITIVE (*)    Benzodiazepines POSITIVE (*)    All other components within normal limits  LACTIC ACID, PLASMA - Abnormal; Notable for the following components:   Lactic Acid, Venous 3.9 (*)    All other components within normal limits  C DIFFICILE QUICK SCREEN W PCR REFLEX  SARS CORONAVIRUS 2 (HOSPITAL ORDER, PERFORMED IN [place] [facility])   URINE CULTURE  GASTROINTESTINAL PANEL BY PCR, STOOL (REPLACES STOOL CULTURE)  CULTURE, BLOOD (ROUTINE X 2)  CULTURE, BLOOD (ROUTINE X 2)  ETHANOL  PROTIME-INR  LACTIC ACID, PLASMA  POC OCCULT BLOOD, ED    EKG EKG Interpretation  Date/Time:  Monday Jul 27 2018 21:34:12 EDT Ventricular Rate:  119 PR Interval:    QRS Duration: 100 QT Interval:  341 QTC Calculation: 480 R Axis:   -28 Text Interpretation:  Junctional tachycardia Probable LVH with secondary repol abnrm Inferior infarct, old Confirmed by Milton Ferguson [phone]) on 07/28/2018 12:30:43 AM   Radiology No results found.  Procedures Procedures (including critical care time)  Medications Ordered in ED Medications  ciprofloxacin (CIPRO) IVPB 400 mg (400 mg Intravenous New Bag/Given 07/28/18 0031)  sodium chloride 0.9 % bolus 1,000 mL (0 mLs Intravenous Stopped 07/28/18 0030)  sodium chloride 0.9 % bolus 1,000 mL (0 mLs Intravenous Stopped 07/28/18 0030)  sodium chloride 0.9 % bolus 1,000 mL (1,000 mLs Intravenous New Bag/Given 07/28/18 0030)     Initial Impression / Assessment and Plan / ED Course  I have reviewed the triage vital signs and the nursing notes.  Pertinent labs & imaging results that were available during my care of the patient were reviewed by me and considered in my medical decision making (see chart for details).        CRITICAL CARE Performed by: Milton Ferguson Total critical care time: 45 minutes Critical care time was exclusive of separately billable procedures and treating other patients. Critical care was necessary to treat or prevent imminent or life-threatening deterioration. Critical care was time spent personally by me on the following activities: development of treatment plan with patient and/or surrogate as well as nursing, discussions with consultants, evaluation of patient's response to treatment, examination of patient, obtaining history from patient or surrogate, ordering and  performing treatments and interventions, ordering and review of laboratory studies, ordering and review of radiographic studies, pulse oximetry and re-evaluation of patient's condition.  Patient's lactic acid is 3.9.  White count is 18,000.  It appears he may be septic from urinary tract infection.  He has been started on antibiotics and cultured appropriately and is going to be admitted to medicine Final Clinical Impressions(s) / ED Diagnoses   Final diagnoses:  Lower urinary tract infectious disease    ED Discharge Orders    None       Milton Ferguson, MD 07/28/18 [phone]

## 2018-07-28 NOTE — Progress Notes (Signed)
Subjective: He says he feels okay.  He is however confused.  He was admitted with UTI and constipation.  He had acute urinary retention and has Foley catheter in place.  He has acute kidney injury.  This is very similar to presentation of about 6 weeks ago.  He had mild sepsis with elevated lactic acid level but his blood pressure looks okay and his pulse rate is only 70 now  Objective: Vital signs in last 24 hours: Temp:  [98.3 F (36.8 C)-98.9 F (37.2 C)] 98.3 F (36.8 C) (05/26 0400) Pulse Rate:  [66-119] 66 (05/26 0715) Resp:  [11-21] 11 (05/26 0715) BP: (98-150)/(48-82) 122/70 (05/26 0600) SpO2:  [90 %-100 %] 100 % (05/26 0715) Weight:  [107.8 kg-109 kg] 107.8 kg (05/26 0500) Weight change:  Last BM Date: 07/28/18  Intake/Output from previous day: 05/25 0701 - 05/26 0700 In: 3596.2 [I.V.:214.5; IV Piggyback:3381.7] Out: 650 [Urine:650]  PHYSICAL EXAM General appearance: alert, cooperative and Mildly confused Resp: clear to auscultation bilaterally Cardio: regular rate and rhythm, S1, S2 normal, no murmur, click, rub or gallop GI: soft, non-tender; bowel sounds normal; no masses,  no organomegaly Extremities: extremities normal, atraumatic, no cyanosis or edema  Lab Results:  Results for orders placed or performed during the hospital encounter of 07/27/18 (from the past 48 hour(s))  Urinalysis, Routine w reflex microscopic     Status: Abnormal   Collection Time: 07/27/18  9:20 PM  Result Value Ref Range   Color, Urine AMBER (A) YELLOW    Comment: BIOCHEMICALS MAY BE AFFECTED BY COLOR   APPearance CLOUDY (A) CLEAR   Specific Gravity, Urine 1.021 1.005 - 1.030   pH 6.0 5.0 - 8.0   Glucose, UA NEGATIVE NEGATIVE mg/dL   Hgb urine dipstick SMALL (A) NEGATIVE   Bilirubin Urine SMALL (A) NEGATIVE   Ketones, ur NEGATIVE NEGATIVE mg/dL   Protein, ur 100 (A) NEGATIVE mg/dL   Nitrite NEGATIVE NEGATIVE   Leukocytes,Ua MODERATE (A) NEGATIVE   RBC / HPF 21-50 0 - 5 RBC/hpf    WBC, UA >50 (H) 0 - 5 WBC/hpf   Bacteria, UA FEW (A) NONE SEEN   Squamous Epithelial / LPF 0-5 0 - 5   WBC Clumps PRESENT    Mucus PRESENT    Hyaline Casts, UA PRESENT     Comment: Performed at [facility], [address] [place],  [address]  Urine rapid drug screen (hosp performed)     Status: Abnormal   Collection Time: 07/27/18  9:20 PM  Result Value Ref Range   Opiates POSITIVE (A) NONE DETECTED   Cocaine NONE DETECTED NONE DETECTED   Benzodiazepines POSITIVE (A) NONE DETECTED   Amphetamines NONE DETECTED NONE DETECTED   Tetrahydrocannabinol NONE DETECTED NONE DETECTED   Barbiturates NONE DETECTED NONE DETECTED    Comment: (NOTE) DRUG SCREEN FOR MEDICAL PURPOSES ONLY.  IF CONFIRMATION IS NEEDED FOR ANY PURPOSE, NOTIFY LAB WITHIN 5 DAYS. LOWEST DETECTABLE LIMITS FOR URINE DRUG SCREEN Drug Class                     Cutoff (ng/mL) Amphetamine and metabolites    1000 Barbiturate and metabolites    200 Benzodiazepine                 160 Tricyclics and metabolites     300 Opiates and metabolites        300 Cocaine and metabolites        300 THC  50 Performed at [facility], [address], [place], [place] [address]   Comprehensive metabolic panel     Status: Abnormal   Collection Time: 07/27/18  9:34 PM  Result Value Ref Range   Sodium 131 (L) 135 - 145 mmol/L   Potassium 3.7 3.5 - 5.1 mmol/L   Chloride 92 (L) 98 - 111 mmol/L   CO2 20 (L) 22 - 32 mmol/L   Glucose, Bld 338 (H) 70 - 99 mg/dL   BUN 48 (H) 8 - 23 mg/dL   Creatinine, Ser 2.98 (H) 0.61 - 1.24 mg/dL   Calcium 9.4 8.9 - 10.3 mg/dL   Total Protein 7.7 6.5 - 8.1 g/dL   Albumin 4.2 3.5 - 5.0 g/dL   AST 27 15 - 41 U/L   ALT 16 0 - 44 U/L   Alkaline Phosphatase 65 38 - 126 U/L   Total Bilirubin 1.0 0.3 - 1.2 mg/dL   GFR calc non Af Amer 21 (L) >60 mL/min   GFR calc Af Amer 24 (L) >60 mL/min   Anion gap 19 (H) 5 - 15    Comment: Performed at [facility], [address]  [place], [place] [address]  Ethanol     Status: None   Collection Time: 07/27/18  9:34 PM  Result Value Ref Range   Alcohol, Ethyl (B) <10 <10 mg/dL    Comment: (NOTE) Lowest detectable limit for serum alcohol is 10 mg/dL. For medical purposes only. Performed at [facility], [address], [place], [place] [address]   Troponin I - Once     Status: Abnormal   Collection Time: 07/27/18  9:34 PM  Result Value Ref Range   Troponin I 0.26 (HH) <0.03 ng/mL    Comment: CRITICAL RESULT CALLED TO, READ BACK BY AND VERIFIED WITH: ELLIS,C ON 07/27/18 AT  2230 BY LOY,C Performed at [facility], [address], [place], [place] [address]   Lactic acid, plasma     Status: Abnormal   Collection Time: 07/27/18  9:34 PM  Result Value Ref Range   Lactic Acid, Venous 3.1 (HH) 0.5 - 1.9 mmol/L    Comment: CRITICAL RESULT CALLED TO, READ BACK BY AND VERIFIED WITH: PRUITT,G ON 07/27/18 AT 2205 BY LOY,C Performed at [facility], [address], [place], [place] [address]   CBC with Differential     Status: Abnormal   Collection Time: 07/27/18  9:34 PM  Result Value Ref Range   WBC 18.1 (H) 4.0 - 10.5 K/uL   RBC 4.52 4.22 - 5.81 MIL/uL   Hemoglobin 14.0 13.0 - 17.0 g/dL   HCT 41.8 39.0 - 52.0 %   MCV 92.5 80.0 - 100.0 fL   MCH 31.0 26.0 - 34.0 pg   MCHC 33.5 30.0 - 36.0 g/dL   RDW 15.1 11.5 - 15.5 %   Platelets 274 150 - 400 K/uL   nRBC 0.0 0.0 - 0.2 %   Neutrophils Relative % 85 %   Neutro Abs 15.2 (H) 1.7 - 7.7 K/uL   Lymphocytes Relative 6 %   Lymphs Abs 1.1 0.7 - 4.0 K/uL   Monocytes Relative 9 %   Monocytes Absolute 1.6 (H) 0.1 - 1.0 K/uL   Eosinophils Relative 0 %   Eosinophils Absolute 0.1 0.0 - 0.5 K/uL   Basophils Relative 0 %   Basophils Absolute 0.0 0.0 - 0.1 K/uL   Immature Granulocytes 0 %   Abs Immature Granulocytes 0.08 (H) 0.00 - 0.07 K/uL    Comment:  Performed at [facility], [address] [place], [place] [address]  Protime-INR     Status: None   Collection Time: 07/27/18   9:34 PM  Result Value Ref Range   Prothrombin Time 13.6 11.4 - 15.2 seconds   INR 1.1 0.8 - 1.2    Comment: (NOTE) INR goal varies based on device and disease states. Performed at [facility], [address] [place], [place] [address]   C difficile quick scan w PCR reflex     Status: None   Collection Time: 07/27/18 10:13 PM  Result Value Ref Range   C Diff antigen NEGATIVE NEGATIVE   C Diff toxin NEGATIVE NEGATIVE   C Diff interpretation No C. difficile detected.     Comment: Performed at [facility], [address], [place], [place] [address]  POC occult blood, ED     Status: None   Collection Time: 07/27/18 10:35 PM  Result Value Ref Range   Fecal Occult Bld NEGATIVE NEGATIVE  SARS Coronavirus 2 (CEPHEID- Performed in [place] hospital lab), Hosp Order     Status: None   Collection Time: 07/27/18 11:20 PM  Result Value Ref Range   SARS Coronavirus 2 NEGATIVE NEGATIVE    Comment: (NOTE) If result is NEGATIVE SARS-CoV-2 target nucleic acids are NOT DETECTED. The SARS-CoV-2 RNA is generally detectable in upper and lower  respiratory specimens during the acute phase of infection. The lowest  concentration of SARS-CoV-2 viral copies this assay can detect is 250  copies / mL. A negative result does not preclude SARS-CoV-2 infection  and should not be used as the sole basis for treatment or other  patient management decisions.  A negative result may occur with  improper specimen collection / handling, submission of specimen other  than nasopharyngeal swab, presence of viral mutation(s) within the  areas targeted by this assay, and inadequate number of viral copies  (<250 copies / mL). A negative result must be combined with clinical  observations, patient history, and epidemiological information. If result is POSITIVE SARS-CoV-2 target nucleic acids are DETECTED. The SARS-CoV-2 RNA is generally detectable in upper and lower  respiratory specimens dur ing the acute phase of  infection.  Positive  results are indicative of active infection with SARS-CoV-2.  Clinical  correlation with patient history and other diagnostic information is  necessary to determine patient infection status.  Positive results do  not rule out bacterial infection or co-infection with other viruses. If result is PRESUMPTIVE POSTIVE SARS-CoV-2 nucleic acids MAY BE PRESENT.   A presumptive positive result was obtained on the submitted specimen  and confirmed on repeat testing.  While 2019 novel coronavirus  (SARS-CoV-2) nucleic acids may be present in the submitted sample  additional confirmatory testing may be necessary for epidemiological  and / or clinical management purposes  to differentiate between  SARS-CoV-2 and other Sarbecovirus currently known to infect humans.  If clinically indicated additional testing with an alternate test  methodology [phone]) is advised. The SARS-CoV-2 RNA is generally  detectable in upper and lower respiratory sp ecimens during the acute  phase of infection. The expected result is Negative. Fact Sheet for Patients:  StrictlyIdeas.no Fact Sheet for Healthcare Providers: BankingDealers.co.za This test is not yet approved or cleared by the [place] FDA and has been authorized for detection and/or diagnosis of SARS-CoV-2 by FDA under an Emergency Use Authorization (EUA).  This EUA will remain in effect (meaning this test can be used) for the duration of the COVID-19  declaration under Section 564(b)(1) of the Act, 21 U.S.C. section 360bbb-3(b)(1), unless the authorization is terminated or revoked sooner. Performed at [facility], [address] [place], [place] [address]   Lactic acid, plasma     Status: Abnormal   Collection Time: 07/27/18 11:26 PM  Result Value Ref Range   Lactic Acid, Venous 3.9 (HH) 0.5 - 1.9 mmol/L    Comment: CRITICAL RESULT CALLED TO, READ BACK BY AND VERIFIED WITH: Lethea Killings,  RN @0007  07/28/18 [facility] Performed at [facility], [address], [place], [place] [address]   MRSA PCR Screening     Status: None   Collection Time: 07/28/18  2:13 AM  Result Value Ref Range   MRSA by PCR NEGATIVE NEGATIVE    Comment:        The GeneXpert MRSA Assay (FDA approved for NASAL specimens only), is one component of a comprehensive MRSA colonization surveillance program. It is not intended to diagnose MRSA infection nor to guide or monitor treatment for MRSA infections. Performed at [facility], [address] [place], [place] [address]   Lactic acid, plasma     Status: Abnormal   Collection Time: 07/28/18  3:09 AM  Result Value Ref Range   Lactic Acid, Venous 3.7 (HH) 0.5 - 1.9 mmol/L    Comment: CRITICAL RESULT CALLED TO, READ BACK BY AND VERIFIED WITH: H PETREAULT,RN @0335  07/28/18 [facility] Performed at [facility], [address], [place], [place] [address]   Basic metabolic panel     Status: Abnormal   Collection Time: 07/28/18  3:09 AM  Result Value Ref Range   Sodium 134 (L) 135 - 145 mmol/L   Potassium 3.9 3.5 - 5.1 mmol/L   Chloride 95 (L) 98 - 111 mmol/L   CO2 23 22 - 32 mmol/L   Glucose, Bld 340 (H) 70 - 99 mg/dL   BUN 47 (H) 8 - 23 mg/dL   Creatinine, Ser 2.84 (H) 0.61 - 1.24 mg/dL   Calcium 8.5 (L) 8.9 - 10.3 mg/dL   GFR calc non Af Amer 22 (L) >60 mL/min   GFR calc Af Amer 26 (L) >60 mL/min   Anion gap 16 (H) 5 - 15    Comment: Performed at [facility], [address], [place], [place] [address]  CBC     Status: Abnormal   Collection Time: 07/28/18  3:09 AM  Result Value Ref Range   WBC 12.0 (H) 4.0 - 10.5 K/uL   RBC 3.92 (L) 4.22 - 5.81 MIL/uL   Hemoglobin 12.3 (L) 13.0 - 17.0 g/dL   HCT 37.5 (L) 39.0 - 52.0 %   MCV 95.7 80.0 - 100.0 fL   MCH 31.4 26.0 - 34.0 pg   MCHC 32.8 30.0 - 36.0 g/dL   RDW 15.3 11.5 - 15.5 %   Platelets 230 150 - 400 K/uL   nRBC 0.0 0.0 - 0.2 %    Comment: Performed at [facility], [address]  [place], [place] [address]  Troponin I - Now Then Q6H     Status: Abnormal   Collection Time: 07/28/18  3:09 AM  Result Value Ref Range   Troponin I 0.32 (HH) <0.03 ng/mL    Comment: CRITICAL RESULT CALLED TO, READ BACK BY AND VERIFIED WITH: DANIELS,J AT 5:30AM ON 07/28/18 BY [facility] Performed at [facility], [address] [place], [place] [address]     ABGS No results for input(s): PHART, PO2ART, TCO2, HCO3 in the last 72 hours.  Invalid input(s):  PCO2 CULTURES Recent Results (from the past 240 hour(s))  C difficile quick scan w PCR reflex     Status: None   Collection Time: 07/27/18 10:13 PM  Result Value Ref Range Status   C Diff antigen NEGATIVE NEGATIVE Final   C Diff toxin NEGATIVE NEGATIVE Final   C Diff interpretation No C. difficile detected.  Final    Comment: Performed at [facility], [address], [place], [place] [address]  SARS Coronavirus 2 ([place]- Performed in [place] hospital lab), Hosp Order     Status: None   Collection Time: 07/27/18 11:20 PM  Result Value Ref Range Status   SARS Coronavirus 2 NEGATIVE NEGATIVE Final    Comment: (NOTE) If result is NEGATIVE SARS-CoV-2 target nucleic acids are NOT DETECTED. The SARS-CoV-2 RNA is generally detectable in upper and lower  respiratory specimens during the acute phase of infection. The lowest  concentration of SARS-CoV-2 viral copies this assay can detect is 250  copies / mL. A negative result does not preclude SARS-CoV-2 infection  and should not be used as the sole basis for treatment or other  patient management decisions.  A negative result may occur with  improper specimen collection / handling, submission of specimen other  than nasopharyngeal swab, presence of viral mutation(s) within the  areas targeted by this assay, and inadequate number of viral copies  (<250 copies / mL). A negative result must be combined with clinical  observations, patient history, and epidemiological information. If  result is POSITIVE SARS-CoV-2 target nucleic acids are DETECTED. The SARS-CoV-2 RNA is generally detectable in upper and lower  respiratory specimens dur ing the acute phase of infection.  Positive  results are indicative of active infection with SARS-CoV-2.  Clinical  correlation with patient history and other diagnostic information is  necessary to determine patient infection status.  Positive results do  not rule out bacterial infection or co-infection with other viruses. If result is PRESUMPTIVE POSTIVE SARS-CoV-2 nucleic acids MAY BE PRESENT.   A presumptive positive result was obtained on the submitted specimen  and confirmed on repeat testing.  While 2019 novel coronavirus  (SARS-CoV-2) nucleic acids may be present in the submitted sample  additional confirmatory testing may be necessary for epidemiological  and / or clinical management purposes  to differentiate between  SARS-CoV-2 and other Sarbecovirus currently known to infect humans.  If clinically indicated additional testing with an alternate test  methodology [phone]) is advised. The SARS-CoV-2 RNA is generally  detectable in upper and lower respiratory sp ecimens during the acute  phase of infection. The expected result is Negative. Fact Sheet for Patients:  StrictlyIdeas.no Fact Sheet for Healthcare Providers: BankingDealers.co.za This test is not yet approved or cleared by the [place] FDA and has been authorized for detection and/or diagnosis of SARS-CoV-2 by FDA under an Emergency Use Authorization (EUA).  This EUA will remain in effect (meaning this test can be used) for the duration of the COVID-19 declaration under Section 564(b)(1) of the Act, 21 U.S.C. section 360bbb-3(b)(1), unless the authorization is terminated or revoked sooner. Performed at [facility], [address], [place], [place] [address]   MRSA PCR Screening     Status: None   Collection  Time: 07/28/18  2:13 AM  Result Value Ref Range Status   MRSA by PCR NEGATIVE NEGATIVE Final    Comment:        The GeneXpert MRSA Assay (FDA approved for NASAL specimens only), is one component of a  comprehensive MRSA colonization surveillance program. It is not intended to diagnose MRSA infection nor to guide or monitor treatment for MRSA infections. Performed at [facility], [address], [place], [place] [address]    Studies/Results: Dg Abd Acute W/chest  Result Date: 07/28/2018 CLINICAL DATA:  Generalized weakness EXAM: DG ABDOMEN ACUTE W/ 1V CHEST COMPARISON:  06/28/2018 FINDINGS: Cardiac shadows within normal limits. 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 The lungs are clear bilaterally. Scattered large and small bowel gas is noted. No free air is seen. No obstructive changes are noted. Degenerative changes of lumbar spine are noted. IMPRESSION: No acute abnormality in the chest and abdomen. Electronically Signed   By: Inez Catalina M.D.   On: 07/28/2018 00:36    Medications:  Prior to Admission:  Medications Prior to Admission  Medication Sig Dispense Refill Last Dose  . acetaminophen (TYLENOL) 500 MG tablet Take 1,000 mg by mouth daily as needed for moderate pain or headache.    Taking  . albuterol (PROAIR HFA) 108 (90 BASE) MCG/ACT inhaler Inhale 2 puffs into the lungs every 6 (six) hours as needed for wheezing or shortness of breath.    Taking  . allopurinol (ZYLOPRIM) 300 MG tablet Take 300 mg by mouth daily.     Taking  . alum & mag hydroxide-simeth (MAALOX/MYLANTA) 200-200-20 MG/5ML suspension Take 15 mLs by mouth every 6 (six) hours as needed for indigestion or heartburn.   Taking  . atorvastatin (LIPITOR) 20 MG tablet Take 20 mg by mouth at bedtime.   Taking  . cholecalciferol (VITAMIN D) 1000 units tablet Take 1,000 Units by mouth daily.   Taking  . fluticasone (FLONASE) 50 MCG/ACT nasal spray Place 2 sprays into both nostrils daily.    Taking  . furosemide  (LASIX) 40 MG tablet Take 40 mg by mouth every morning. & 20 mg in the evening   Taking  . gabapentin (NEURONTIN) 400 MG capsule Take 400 mg by mouth daily.    Taking  . hydrALAZINE (APRESOLINE) 100 MG tablet Take 1 tablet (100 mg total) by mouth 3 (three) times daily. 90 tablet 3   . HYDROcodone-acetaminophen (NORCO/VICODIN) 5-325 MG tablet Take 1 tablet by mouth every 6 (six) hours as needed.   Taking  . insulin lispro (HUMALOG KWIKPEN) 100 UNIT/ML KiwkPen Inject 18-20 Units into the skin as directed. Inject 2 to 18 units based on sliding scale as directed as needed for blood glucose over 200   Taking  . isosorbide mononitrate (IMDUR) 30 MG 24 hr tablet Take 1 tablet (30 mg total) by mouth daily. 90 tablet 0 Taking  . LANTUS SOLOSTAR 100 UNIT/ML Solostar Pen Inject 20 Units into the skin 2 (two) times daily.    Taking  . linaclotide (LINZESS) 145 MCG CAPS capsule Take 1 capsule (145 mcg total) by mouth daily before breakfast. 30 capsule 5 Taking  . loratadine (CLARITIN) 10 MG tablet Take 10 mg by mouth daily.   Taking  . metoCLOPramide (REGLAN) 5 MG tablet Take 5 mg by mouth 3 (three) times daily before meals.   Taking  . metoprolol tartrate (LOPRESSOR) 100 MG tablet Take 100 mg by mouth 2 (two) times daily.   Taking  . Multiple Vitamin (MULTIVITAMIN WITH MINERALS) TABS tablet Take 1 tablet by mouth daily.   Taking  . ondansetron (ZOFRAN) 4 MG tablet Take 1 tablet (4 mg total) by mouth every 8 (eight) hours as needed for nausea or vomiting. 30 tablet 1 Taking  . pantoprazole (PROTONIX) 40  MG tablet Take 1 tablet (40 mg total) by mouth daily.   Taking  . potassium chloride SA (K-DUR) 20 MEQ tablet Take 1 tablet (20 mEq total) by mouth 2 (two) times daily. 60 tablet 5 Taking  . tamsulosin (FLOMAX) 0.4 MG CAPS capsule Take 1 capsule by mouth daily.   Taking  . traZODone (DESYREL) 50 MG tablet Take 0.5 tablets (25 mg total) by mouth at bedtime as needed for sleep. 30 tablet 5 Taking  . vitamin C  (ASCORBIC ACID) 500 MG tablet Take 500 mg by mouth 2 (two) times daily.   Taking   Scheduled: . mouth rinse  15 mL Mouth Rinse BID   Continuous: . sodium chloride Stopped (07/28/18 0514)  . ciprofloxacin     GKK:[id number] **OR** acetaminophen, ondansetron **OR** ondansetron (ZOFRAN) IV  Assesment: He was admitted with urinary tract infection with mild sepsis.  He is better but he is confused.  He has intellectual impairment at baseline.  Sepsis pathophysiology has largely resolved but he still does have elevated lactate  He had elevated troponin which I think is related to demand ischemia.  He is not having any chest pain and his EKG does not show any evidence of acute coronary syndrome so I do not think he needs any treatment for this.  He has acute renal failure superimposed on stage III chronic kidney disease and that is a little bit better.  He has COPD at baseline and that is stable  He has chronic systolic heart failure so we have to be careful with IV fluids but I think he still going to need some fluid resuscitation at this point  He has an ICD which has not fired  He has multisystem problems requiring high complexity decision making Principal Problem:   Lower urinary tract infectious disease Active Problems:   COPD (chronic obstructive pulmonary disease) (HCC)   SVT (supraventricular tachycardia) ([place])   Debility   Acute renal failure superimposed on stage 3 chronic kidney disease ([place])   Chronic systolic CHF (congestive heart failure) ([place])   ICD (implantable cardioverter-defibrillator) in place    Plan: Continue treatments.  Watch his renal function.  Continue IV antibiotics.  Continue IV fluids for now with close attention to his fluid status.    LOS: 0 days   Frank Hoover 07/28/2018, 7:57 AM

## 2018-07-29 LAB — CBC WITH DIFFERENTIAL/PLATELET
Abs Immature Granulocytes: 0.03 10*3/uL (ref 0.00–0.07)
Basophils Absolute: 0 10*3/uL (ref 0.0–0.1)
Basophils Relative: 0 %
Eosinophils Absolute: 0 10*3/uL (ref 0.0–0.5)
Eosinophils Relative: 1 %
HCT: 34.6 % — ABNORMAL LOW (ref 39.0–52.0)
Hemoglobin: 11.6 g/dL — ABNORMAL LOW (ref 13.0–17.0)
Immature Granulocytes: 0 %
Lymphocytes Relative: 21 %
Lymphs Abs: 1.8 10*3/uL (ref 0.7–4.0)
MCH: 30.9 pg (ref 26.0–34.0)
MCHC: 33.5 g/dL (ref 30.0–36.0)
MCV: 92.3 fL (ref 80.0–100.0)
Monocytes Absolute: 0.8 10*3/uL (ref 0.1–1.0)
Monocytes Relative: 9 %
Neutro Abs: 6 10*3/uL (ref 1.7–7.7)
Neutrophils Relative %: 69 %
Platelets: 208 10*3/uL (ref 150–400)
RBC: 3.75 MIL/uL — ABNORMAL LOW (ref 4.22–5.81)
RDW: 14.8 % (ref 11.5–15.5)
WBC: 8.6 10*3/uL (ref 4.0–10.5)
nRBC: 0 % (ref 0.0–0.2)

## 2018-07-29 LAB — GASTROINTESTINAL PANEL BY PCR, STOOL (REPLACES STOOL CULTURE)

## 2018-07-29 LAB — GLUCOSE, CAPILLARY
Glucose-Capillary: 359 mg/dL — ABNORMAL HIGH (ref 70–99)
Glucose-Capillary: 334 mg/dL — ABNORMAL HIGH (ref 70–99)
Glucose-Capillary: 273 mg/dL — ABNORMAL HIGH (ref 70–99)
Glucose-Capillary: 283 mg/dL — ABNORMAL HIGH (ref 70–99)

## 2018-07-29 LAB — BASIC METABOLIC PANEL
Anion gap: 15 (ref 5–15)
BUN: 39 mg/dL — ABNORMAL HIGH (ref 8–23)
CO2: 26 mmol/L (ref 22–32)
Calcium: 9 mg/dL (ref 8.9–10.3)
Chloride: 97 mmol/L — ABNORMAL LOW (ref 98–111)
Creatinine, Ser: 2.07 mg/dL — ABNORMAL HIGH (ref 0.61–1.24)
GFR calc Af Amer: 38 mL/min — ABNORMAL LOW (ref >60)
GFR calc non Af Amer: 32 mL/min — ABNORMAL LOW (ref >60)
Glucose, Bld: 388 mg/dL — ABNORMAL HIGH (ref 70–99)
Potassium: 3.2 mmol/L — ABNORMAL LOW (ref 3.5–5.1)
Sodium: 138 mmol/L (ref 135–145)

## 2018-07-29 MED ORDER — VITAMIN D 25 MCG (1000 UNIT) PO TABS
1000.0000 [IU] | ORAL_TABLET | Freq: Every day | ORAL | Status: DC
  Administered 2018-07-29 – 2018-08-02 (×5): 1000 [IU] via ORAL
  Filled 2018-07-29 (×5): qty 1

## 2018-07-29 MED ORDER — GABAPENTIN 400 MG PO CAPS
400.0000 mg | ORAL_CAPSULE | Freq: Every day | ORAL | Status: DC
  Administered 2018-07-29 – 2018-08-02 (×5): 400 mg via ORAL
  Filled 2018-07-29 (×5): qty 1

## 2018-07-29 MED ORDER — ATORVASTATIN CALCIUM 20 MG PO TABS
20.0000 mg | ORAL_TABLET | Freq: Every day | ORAL | Status: DC
  Administered 2018-07-29 – 2018-08-01 (×4): 20 mg via ORAL
  Filled 2018-07-29 (×4): qty 1

## 2018-07-29 MED ORDER — ALBUTEROL SULFATE (2.5 MG/3ML) 0.083% IN NEBU: 3.0000 mL | INHALATION_SOLUTION | Freq: Four times a day (QID) | RESPIRATORY_TRACT | Status: DC | PRN

## 2018-07-29 MED ORDER — TAMSULOSIN HCL 0.4 MG PO CAPS
0.4000 mg | ORAL_CAPSULE | Freq: Every day | ORAL | Status: DC
  Administered 2018-07-29 – 2018-08-02 (×5): 0.4 mg via ORAL
  Filled 2018-07-29 (×5): qty 1

## 2018-07-29 MED ORDER — INSULIN ASPART 100 UNIT/ML ~~LOC~~ SOLN
0.0000 [IU] | Freq: Every day | SUBCUTANEOUS | Status: DC
  Administered 2018-07-29 – 2018-07-30 (×2): 3 [IU] via SUBCUTANEOUS

## 2018-07-29 MED ORDER — LIVING WELL WITH DIABETES BOOK
Freq: Once | Status: AC
  Administered 2018-07-29: 18:00:00

## 2018-07-29 MED ORDER — TRAZODONE HCL 50 MG PO TABS: 25.0000 mg | ORAL_TABLET | Freq: Every evening | ORAL | Status: DC | PRN

## 2018-07-29 MED ORDER — METOCLOPRAMIDE HCL 10 MG PO TABS
5.0000 mg | ORAL_TABLET | Freq: Three times a day (TID) | ORAL | Status: DC
  Administered 2018-07-29 – 2018-08-02 (×13): 5 mg via ORAL
  Filled 2018-07-29 (×13): qty 1

## 2018-07-29 MED ORDER — POTASSIUM CHLORIDE CRYS ER 20 MEQ PO TBCR
40.0000 meq | EXTENDED_RELEASE_TABLET | Freq: Once | ORAL | Status: AC
  Administered 2018-07-29: 09:00:00 40 meq via ORAL
  Filled 2018-07-29: qty 2

## 2018-07-29 MED ORDER — INSULIN ASPART 100 UNIT/ML ~~LOC~~ SOLN
4.0000 [IU] | Freq: Three times a day (TID) | SUBCUTANEOUS | Status: DC
  Administered 2018-07-29 – 2018-08-02 (×13): 4 [IU] via SUBCUTANEOUS

## 2018-07-29 MED ORDER — PANTOPRAZOLE SODIUM 40 MG PO TBEC
40.0000 mg | DELAYED_RELEASE_TABLET | Freq: Every day | ORAL | Status: DC
  Administered 2018-07-29 – 2018-08-02 (×5): 40 mg via ORAL
  Filled 2018-07-29 (×5): qty 1

## 2018-07-29 MED ORDER — METOPROLOL TARTRATE 50 MG PO TABS
100.0000 mg | ORAL_TABLET | Freq: Two times a day (BID) | ORAL | Status: DC
  Administered 2018-07-29 – 2018-08-02 (×9): 100 mg via ORAL
  Filled 2018-07-29 (×10): qty 2

## 2018-07-29 MED ORDER — INSULIN GLARGINE 100 UNIT/ML ~~LOC~~ SOLN
10.0000 [IU] | Freq: Every day | SUBCUTANEOUS | Status: DC
  Administered 2018-07-29: 10 [IU] via SUBCUTANEOUS
  Filled 2018-07-29 (×2): qty 0.1

## 2018-07-29 MED ORDER — ALLOPURINOL 300 MG PO TABS: 300.0000 mg | ORAL_TABLET | Freq: Every day | ORAL | Status: DC

## 2018-07-29 MED ORDER — CIPROFLOXACIN IN D5W 400 MG/200ML IV SOLN
400.0000 mg | Freq: Two times a day (BID) | INTRAVENOUS | Status: DC
  Administered 2018-07-29 – 2018-07-30 (×3): 400 mg via INTRAVENOUS
  Filled 2018-07-29 (×3): qty 200

## 2018-07-29 MED ORDER — LORATADINE 10 MG PO TABS
10.0000 mg | ORAL_TABLET | Freq: Every day | ORAL | Status: DC
  Administered 2018-07-29 – 2018-08-02 (×5): 10 mg via ORAL
  Filled 2018-07-29 (×5): qty 1

## 2018-07-29 MED ORDER — METOPROLOL TARTRATE 50 MG PO TABS: 100.0000 mg | ORAL_TABLET | Freq: Two times a day (BID) | ORAL | Status: DC

## 2018-07-29 MED ORDER — FLUTICASONE PROPIONATE 50 MCG/ACT NA SUSP
2.0000 | Freq: Every day | NASAL | Status: DC
  Administered 2018-07-29 – 2018-08-02 (×5): 2 via NASAL
  Filled 2018-07-29 (×2): qty 16

## 2018-07-29 MED ORDER — ISOSORBIDE MONONITRATE ER 60 MG PO TB24
30.0000 mg | ORAL_TABLET | Freq: Every day | ORAL | Status: DC
  Administered 2018-07-29 – 2018-08-02 (×5): 30 mg via ORAL
  Filled 2018-07-29 (×5): qty 1

## 2018-07-29 MED ORDER — INSULIN ASPART 100 UNIT/ML ~~LOC~~ SOLN
0.0000 [IU] | Freq: Three times a day (TID) | SUBCUTANEOUS | Status: DC
  Administered 2018-07-29: 12:00:00 11 [IU] via SUBCUTANEOUS
  Administered 2018-07-29: 09:00:00 15 [IU] via SUBCUTANEOUS
  Administered 2018-07-29: 17:00:00 8 [IU] via SUBCUTANEOUS
  Administered 2018-07-30: 15 [IU] via SUBCUTANEOUS
  Administered 2018-07-30: 8 [IU] via SUBCUTANEOUS
  Administered 2018-07-30: 16:00:00 3 [IU] via SUBCUTANEOUS
  Administered 2018-07-31: 08:00:00 8 [IU] via SUBCUTANEOUS
  Administered 2018-07-31: 5 [IU] via SUBCUTANEOUS
  Administered 2018-07-31: 12:00:00 11 [IU] via SUBCUTANEOUS
  Administered 2018-08-01 (×2): 3 [IU] via SUBCUTANEOUS
  Administered 2018-08-01 – 2018-08-02 (×2): 2 [IU] via SUBCUTANEOUS

## 2018-07-29 MED ORDER — HYDROCODONE-ACETAMINOPHEN 5-325 MG PO TABS: 1.0000 | ORAL_TABLET | Freq: Four times a day (QID) | ORAL | Status: DC | PRN

## 2018-07-29 MED ORDER — ALLOPURINOL 100 MG PO TABS
100.0000 mg | ORAL_TABLET | Freq: Every day | ORAL | Status: DC
  Administered 2018-07-29 – 2018-08-02 (×5): 100 mg via ORAL
  Filled 2018-07-29 (×5): qty 1

## 2018-07-29 NOTE — TOC Progression Note (Signed)
Transition of Care [facility]) - Progression Note    Patient Details  Name: Frank Hoover MRN: [id number] Date of Birth: 1950/12/14  [facility] CM/SW Contact  Shade Flood, LCSW Phone Number: 07/29/2018, 12:41 PM  Clinical Narrative:     Spoke with pt's wife and his nephew to update. Pt's nephew states he doesn't live with pt but he does provide assistance with transportation to appointments and pharmacy as needed. Pt's wife states that their daughter lives with them and helps he provide care for pt.   Spoke with Santiago Glad at [employer] to update. She confirms pt is active with them for RN, PT and OT.  Will make sure pt has resumption orders at dc.  Will follow.   Expected Discharge Plan: [place] Barriers to Discharge: No Barriers Identified  Expected Discharge Plan and Services Expected Discharge Plan: [place] In-house Referral: Clinical Social Work   Post Acute Care Choice: [place] arrangements for the past 2 months: Single Family Home                           HH Arranged: OT, PT, Nurse's Aide, RN [place] Agency: [place]         Social Determinants of Health (SDOH) Interventions    Readmission Risk Interventions Readmission Risk Prevention Plan 06/29/2018  Transportation Screening Complete  PCP or Specialist Appt within 3-5 Days Complete  HRI or [place] Complete  Social Work Consult for [place] Planning/Counseling Complete  Palliative Care Screening Not Applicable  Medication Review Press photographer) Complete  Some recent data might be hidden

## 2018-07-29 NOTE — Plan of Care (Signed)
  Problem: Acute Rehab PT Goals(only PT should resolve) Goal: Pt Will Go Supine/Side To Sit Outcome: Progressing Flowsheets (Taken 07/29/2018 1431) Pt will go Supine/Side to Sit: with minimal assist; with moderate assist Goal: Patient Will Transfer Sit To/From Stand Outcome: Progressing Flowsheets (Taken 07/29/2018 1431) Patient will transfer sit to/from stand: with minimal assist; with moderate assist Goal: Pt Will Transfer Bed To Chair/Chair To Bed Outcome: Progressing Flowsheets (Taken 07/29/2018 1431) Pt will Transfer Bed to Chair/Chair to Bed: with min assist; with mod assist Goal: Pt Will Ambulate Outcome: Progressing Flowsheets (Taken 07/29/2018 1431) Pt will Ambulate: 25 feet; with moderate assist; with rolling walker   2:31 PM, 07/29/18 Lonell Grandchild, MPT Physical Therapist with [facility] 336 [phone] office [phone] mobile phone

## 2018-07-29 NOTE — NC FL2 (Signed)
[place] LEVEL OF CARE SCREENING TOOL     IDENTIFICATION  Patient Name: IZZAK Hoover Birthdate: 1951/01/07 Sex: male Admission Date (Current Location): 07/27/2018  [facility] and [place] Number:  [employer] and Address:  [facility] [address], [place]      Provider Number: [id number]  Attending Physician Name and Address:  Sinda Du, MD  Relative Name and Phone Number:       Current Level of Care: Hospital Recommended Level of Care: [place] Prior Approval Number:    Date Approved/Denied:   PASRR Number: [id number]  Discharge Plan: SNF    Current Diagnoses: Patient Active Problem List   Diagnosis Date Noted  . Severe sepsis ([place]) 06/26/2018  . Obstipation 06/26/2018  . Acute urinary retention 06/26/2018  . Mild bilateral Hydronephrosis 06/26/2018  . CKD (chronic kidney disease) stage 3, GFR 30-59 ml/min (HCC) 06/26/2018  . NICM (nonischemic cardiomyopathy) ([place]) 06/26/2018  . ICD (implantable cardioverter-defibrillator) in place 06/26/2018  . Hypokalemia 06/26/2018  . Hyperglycemia 06/26/2018  . Proctocolitis 06/26/2018  . Lower urinary tract infectious disease 06/26/2018  . Pressure injury of skin 06/26/2018  . Chronic systolic CHF (congestive heart failure) ([place]) 06/11/2016  . Diarrhea 04/18/2016  . Anemia 10/17/2015  . Nausea and vomiting 10/17/2015  . Atrial flutter ([place]) 09/27/2015  . Pressure ulcer 09/05/2015  . Abdominal pain   . Atrial fibrillation with RVR ([place])   . HCAP (healthcare-associated pneumonia)   . Acute renal failure superimposed on stage 3 chronic kidney disease ([place])   . Metabolic encephalopathy   . Abdominal distension   . Urinary tract infection, site not specified   . Cardiogenic shock ([place])   . Acute systolic CHF (congestive heart failure) ([place])   . Uncontrolled type 2 diabetes mellitus with complication ([place])   . Urinary retention   . Bacteremia    . Ischemic colitis ([place])   . Noninfectious gastroenteritis, unspecified   . Proctosigmoiditis   . Lower GI bleed   . SVT (supraventricular tachycardia) ([place]) 08/24/2015  . Debility   . Blood in stool   . Acute blood loss anemia   . Abnormal CT scan, colon   . Acute systolic heart failure ([place])   . Acute kidney injury ([place])   . Acute respiratory failure ([place])   . Septic shock ([place])   . Acute encephalopathy   . Atrial flutter with rapid ventricular response ([place]) 08/14/2015  . Systolic CHF ([place]) 00/93/8182  . History of colonic polyps   . Dehydration 12/05/2013  . Acute renal failure ([place]) 12/02/2013  . Sleep apnea 12/02/2013  . Hyponatremia 12/02/2013  . Acute on chronic renal failure ([place]) 12/02/2013  . COPD (chronic obstructive pulmonary disease) ([place])   . DM (diabetes mellitus) ([place])   . HTN (hypertension)   . Hyperlipemia   . CRF (chronic renal failure)   . Hepatomegaly 03/25/2013  . Dysphagia 12/06/2010  . History of adenomatous polyp of colon 09/21/2010  . Gastroparesis 09/21/2010  . Obesity 09/21/2010  . GERD (gastroesophageal reflux disease) 10/09/2009  . Constipation 10/09/2009  . ABDOMINAL PAIN-EPIGASTRIC 10/09/2009    Orientation RESPIRATION BLADDER Height & Weight     Self, Time, Situation, Place  Normal Continent Weight: 107.1 kg Height:  5\' 6"  (167.6 cm)  BEHAVIORAL SYMPTOMS/MOOD NEUROLOGICAL BOWEL NUTRITION STATUS  (none) (none) Continent (Carb modiified)  AMBULATORY STATUS COMMUNICATION OF NEEDS Skin   Extensive Assist Verbally Normal  Personal Care Assistance Level of Assistance  Bathing, Feeding, Dressing Bathing Assistance: Limited assistance Feeding assistance: Independent Dressing Assistance: Limited assistance     Functional Limitations Info  Sight, Hearing, Speech Sight Info: Adequate Hearing Info: Adequate Speech Info: Adequate    SPECIAL CARE FACTORS FREQUENCY  PT (By licensed PT)     PT Frequency:  5X/W              Contractures      Additional Factors Info  Code Status, Allergies Code Status Info: full Allergies Info: NKA           Current Medications (07/29/2018):  This is the current hospital active medication list Current Facility-Administered Medications  Medication Dose Route Frequency Provider Last Rate Last Dose  . 0.9 %  sodium chloride infusion   Intravenous Continuous Sinda Du, MD 50 mL/hr at 07/29/18 [phone]    . acetaminophen (TYLENOL) tablet 650 mg  650 mg Oral Q6H PRN Phillips Grout, MD       Or  . acetaminophen (TYLENOL) suppository 650 mg  650 mg Rectal Q6H PRN Derrill Kay A, MD      . albuterol (PROVENTIL) (2.5 MG/3ML) 0.083% nebulizer solution 3 mL  3 mL Inhalation Q6H PRN Sinda Du, MD      . allopurinol (ZYLOPRIM) tablet 100 mg  100 mg Oral Daily Sinda Du, MD   100 mg at 07/29/18 0931  . atorvastatin (LIPITOR) tablet 20 mg  20 mg Oral QHS Sinda Du, MD      . cholecalciferol (VITAMIN D3) tablet 1,000 Units  1,000 Units Oral Daily Sinda Du, MD   1,000 Units at 07/29/18 0931  . ciprofloxacin (CIPRO) IVPB 400 mg  400 mg Intravenous Emmit Pomfret, MD 200 mL/hr at 07/29/18 1031 400 mg at 07/29/18 1031  . fluticasone (FLONASE) 50 MCG/ACT nasal spray 2 spray  2 spray Each Nare Daily Sinda Du, MD   2 spray at 07/29/18 [phone]  . gabapentin (NEURONTIN) capsule 400 mg  400 mg Oral Daily Sinda Du, MD   400 mg at 07/29/18 0931  . HYDROcodone-acetaminophen (NORCO/VICODIN) 5-325 MG per tablet 1 tablet  1 tablet Oral Q6H PRN Sinda Du, MD      . insulin aspart (novoLOG) injection 0-15 Units  0-15 Units Subcutaneous TID WC Sinda Du, MD   11 Units at 07/29/18 1214  . insulin aspart (novoLOG) injection 0-5 Units  0-5 Units Subcutaneous QHS Sinda Du, MD      . insulin aspart (novoLOG) injection 4 Units  4 Units Subcutaneous TID WC Sinda Du, MD   4 Units at 07/29/18 1214  . insulin glargine  (LANTUS) injection 10 Units  10 Units Subcutaneous QHS Sinda Du, MD      . isosorbide mononitrate (IMDUR) 24 hr tablet 30 mg  30 mg Oral Daily Sinda Du, MD   30 mg at 07/29/18 0933  . loratadine (CLARITIN) tablet 10 mg  10 mg Oral Daily Sinda Du, MD   10 mg at 07/29/18 0933  . metoCLOPramide (REGLAN) tablet 5 mg  5 mg Oral TID Delrae Alfred, MD   5 mg at 07/29/18 1215  . metoprolol tartrate (LOPRESSOR) tablet 100 mg  100 mg Oral BID Derrill Kay A, MD   100 mg at 07/29/18 0557  . ondansetron (ZOFRAN) tablet 4 mg  4 mg Oral Q6H PRN Phillips Grout, MD       Or  . ondansetron (ZOFRAN) injection 4 mg  4 mg Intravenous Q6H PRN  Phillips Grout, MD      . pantoprazole (PROTONIX) EC tablet 40 mg  40 mg Oral Daily Sinda Du, MD   40 mg at 07/29/18 0931  . tamsulosin (FLOMAX) capsule 0.4 mg  0.4 mg Oral Daily Sinda Du, MD   0.4 mg at 07/29/18 0931  . traZODone (DESYREL) tablet 25 mg  25 mg Oral QHS PRN Sinda Du, MD         Discharge Medications: Please see discharge summary for a list of discharge medications.  Relevant Imaging Results:  Relevant Lab Results:   Additional Information SSN [id number]  Trish Mage, [place]

## 2018-07-29 NOTE — Progress Notes (Signed)
Subjective: He says he feels better.  He had multiple bowel movements yesterday.  I think he probably had impaction previously.  This is happened to him in the past.  He has a urinary tract infection.  He was septic but sepsis pathophysiology has resolved.  He has no complaints this morning.  Objective: Vital signs in last 24 hours: Temp:  [98.1 F (36.7 C)-99.3 F (37.4 C)] 99.3 F (37.4 C) (05/27 0748) Pulse Rate:  [70-86] 83 (05/27 0748) Resp:  [10-20] 13 (05/27 0748) BP: (124-161)/(64-88) 161/88 (05/27 0552) SpO2:  [96 %-99 %] 97 % (05/27 0748) Weight:  [107.1 kg] 107.1 kg (05/27 0500) Weight change: -1.9 kg Last BM Date: 07/28/18  Intake/Output from previous day: 05/26 0701 - 05/27 0700 In: 1493.7 [I.V.:1293.7; IV Piggyback:200] Out: 1157 [Urine:3050; Stool:5]  PHYSICAL EXAM General appearance: alert, cooperative and no distress Resp: clear to auscultation bilaterally Cardio: regular rate and rhythm, S1, S2 normal, no murmur, click, rub or gallop GI: He is mildly tender diffusely Extremities: extremities normal, atraumatic, no cyanosis or edema  Lab Results:  Results for orders placed or performed during the hospital encounter of 07/27/18 (from the past 48 hour(s))  Urinalysis, Routine w reflex microscopic     Status: Abnormal   Collection Time: 07/27/18  9:20 PM  Result Value Ref Range   Color, Urine AMBER (A) YELLOW    Comment: BIOCHEMICALS MAY BE AFFECTED BY COLOR   APPearance CLOUDY (A) CLEAR   Specific Gravity, Urine 1.021 1.005 - 1.030   pH 6.0 5.0 - 8.0   Glucose, UA NEGATIVE NEGATIVE mg/dL   Hgb urine dipstick SMALL (A) NEGATIVE   Bilirubin Urine SMALL (A) NEGATIVE   Ketones, ur NEGATIVE NEGATIVE mg/dL   Protein, ur 100 (A) NEGATIVE mg/dL   Nitrite NEGATIVE NEGATIVE   Leukocytes,Ua MODERATE (A) NEGATIVE   RBC / HPF 21-50 0 - 5 RBC/hpf   WBC, UA >50 (H) 0 - 5 WBC/hpf   Bacteria, UA FEW (A) NONE SEEN   Squamous Epithelial / LPF 0-5 0 - 5   WBC Clumps  PRESENT    Mucus PRESENT    Hyaline Casts, UA PRESENT     Comment: Performed at [facility], [address] [place], [place] [address]  Urine rapid drug screen (hosp performed)     Status: Abnormal   Collection Time: 07/27/18  9:20 PM  Result Value Ref Range   Opiates POSITIVE (A) NONE DETECTED   Cocaine NONE DETECTED NONE DETECTED   Benzodiazepines POSITIVE (A) NONE DETECTED   Amphetamines NONE DETECTED NONE DETECTED   Tetrahydrocannabinol NONE DETECTED NONE DETECTED   Barbiturates NONE DETECTED NONE DETECTED    Comment: (NOTE) DRUG SCREEN FOR MEDICAL PURPOSES ONLY.  IF CONFIRMATION IS NEEDED FOR ANY PURPOSE, NOTIFY LAB WITHIN 5 DAYS. LOWEST DETECTABLE LIMITS FOR URINE DRUG SCREEN Drug Class                     Cutoff (ng/mL) Amphetamine and metabolites    1000 Barbiturate and metabolites    200 Benzodiazepine                 559 Tricyclics and metabolites     300 Opiates and metabolites        300 Cocaine and metabolites        300 THC                            50  Performed at [facility], [address], [place], [place] [address]   Comprehensive metabolic panel     Status: Abnormal   Collection Time: 07/27/18  9:34 PM  Result Value Ref Range   Sodium 131 (L) 135 - 145 mmol/L   Potassium 3.7 3.5 - 5.1 mmol/L   Chloride 92 (L) 98 - 111 mmol/L   CO2 20 (L) 22 - 32 mmol/L   Glucose, Bld 338 (H) 70 - 99 mg/dL   BUN 48 (H) 8 - 23 mg/dL   Creatinine, Ser 2.98 (H) 0.61 - 1.24 mg/dL   Calcium 9.4 8.9 - 10.3 mg/dL   Total Protein 7.7 6.5 - 8.1 g/dL   Albumin 4.2 3.5 - 5.0 g/dL   AST 27 15 - 41 U/L   ALT 16 0 - 44 U/L   Alkaline Phosphatase 65 38 - 126 U/L   Total Bilirubin 1.0 0.3 - 1.2 mg/dL   GFR calc non Af Amer 21 (L) >60 mL/min   GFR calc Af Amer 24 (L) >60 mL/min   Anion gap 19 (H) 5 - 15    Comment: Performed at [facility], [address] [place], [place] [address]  Ethanol     Status: None   Collection Time: 07/27/18  9:34 PM  Result Value Ref Range    Alcohol, Ethyl (B) <10 <10 mg/dL    Comment: (NOTE) Lowest detectable limit for serum alcohol is 10 mg/dL. For medical purposes only. Performed at [facility], [address] [place], [place] [address]   Troponin I - Once     Status: Abnormal   Collection Time: 07/27/18  9:34 PM  Result Value Ref Range   Troponin I 0.26 (HH) <0.03 ng/mL    Comment: CRITICAL RESULT CALLED TO, READ BACK BY AND VERIFIED WITH: ELLIS,C ON 07/27/18 AT  2230 BY LOY,C Performed at [facility], [address] [place], [place] [address]   Lactic acid, plasma     Status: Abnormal   Collection Time: 07/27/18  9:34 PM  Result Value Ref Range   Lactic Acid, Venous 3.1 (HH) 0.5 - 1.9 mmol/L    Comment: CRITICAL RESULT CALLED TO, READ BACK BY AND VERIFIED WITH: PRUITT,G ON 07/27/18 AT 2205 BY LOY,C Performed at [facility], [address], [place],  [address]   CBC with Differential     Status: Abnormal   Collection Time: 07/27/18  9:34 PM  Result Value Ref Range   WBC 18.1 (H) 4.0 - 10.5 K/uL   RBC 4.52 4.22 - 5.81 MIL/uL   Hemoglobin 14.0 13.0 - 17.0 g/dL   HCT 41.8 39.0 - 52.0 %   MCV 92.5 80.0 - 100.0 fL   MCH 31.0 26.0 - 34.0 pg   MCHC 33.5 30.0 - 36.0 g/dL   RDW 15.1 11.5 - 15.5 %   Platelets 274 150 - 400 K/uL   nRBC 0.0 0.0 - 0.2 %   Neutrophils Relative % 85 %   Neutro Abs 15.2 (H) 1.7 - 7.7 K/uL   Lymphocytes Relative 6 %   Lymphs Abs 1.1 0.7 - 4.0 K/uL   Monocytes Relative 9 %   Monocytes Absolute 1.6 (H) 0.1 - 1.0 K/uL   Eosinophils Relative 0 %   Eosinophils Absolute 0.1 0.0 - 0.5 K/uL   Basophils Relative 0 %   Basophils Absolute 0.0 0.0 - 0.1 K/uL   Immature Granulocytes 0 %   Abs Immature Granulocytes 0.08 (H) 0.00 - 0.07 K/uL    Comment: Performed  at [facility], [address] [place], [place] [address]  Protime-INR     Status: None   Collection Time: 07/27/18  9:34 PM  Result Value Ref Range   Prothrombin Time 13.6 11.4 - 15.2 seconds   INR 1.1 0.8 - 1.2    Comment:  (NOTE) INR goal varies based on device and disease states. Performed at [facility], [address], [place], [place] [address]   C difficile quick scan w PCR reflex     Status: None   Collection Time: 07/27/18 10:13 PM  Result Value Ref Range   C Diff antigen NEGATIVE NEGATIVE   C Diff toxin NEGATIVE NEGATIVE   C Diff interpretation No C. difficile detected.     Comment: Performed at [facility], [address], [place], [place] [address]  POC occult blood, ED     Status: None   Collection Time: 07/27/18 10:35 PM  Result Value Ref Range   Fecal Occult Bld NEGATIVE NEGATIVE  SARS Coronavirus 2 (CEPHE[place]- Performed in [place] hospital lab), Hosp Order     Status: None   Collection Time: 07/27/18 11:20 PM  Result Value Ref Range   SARS Coronavirus 2 NEGATIVE NEGATIVE    Comment: (NOTE) If result is NEGATIVE SARS-CoV-2 target nucleic acids are NOT DETECTED. The SARS-CoV-2 RNA is generally detectable in upper and lower  respiratory specimens during the acute phase of infection. The lowest  concentration of SARS-CoV-2 viral copies this assay can detect is 250  copies / mL. A negative result does not preclude SARS-CoV-2 infection  and should not be used as the sole basis for treatment or other  patient management decisions.  A negative result may occur with  improper specimen collection / handling, submission of specimen other  than nasopharyngeal swab, presence of viral mutation(s) within the  areas targeted by this assay, and inadequate number of viral copies  (<250 copies / mL). A negative result must be combined with clinical  observations, patient history, and epidemiological information. If result is POSITIVE SARS-CoV-2 target nucleic acids are DETECTED. The SARS-CoV-2 RNA is generally detectable in upper and lower  respiratory specimens dur ing the acute phase of infection.  Positive  results are indicative of active infection with SARS-CoV-2.  Clinical  correlation with  patient history and other diagnostic information is  necessary to determine patient infection status.  Positive results do  not rule out bacterial infection or co-infection with other viruses. If result is PRESUMPTIVE POSTIVE SARS-CoV-2 nucleic acids MAY BE PRESENT.   A presumptive positive result was obtained on the submitted specimen  and confirmed on repeat testing.  While 2019 novel coronavirus  (SARS-CoV-2) nucleic acids may be present in the submitted sample  additional confirmatory testing may be necessary for epidemiological  and / or clinical management purposes  to differentiate between  SARS-CoV-2 and other Sarbecovirus currently known to infect humans.  If clinically indicated additional testing with an alternate test  methodology [phone]) is advised. The SARS-CoV-2 RNA is generally  detectable in upper and lower respiratory sp ecimens during the acute  phase of infection. The expected result is Negative. Fact Sheet for Patients:  StrictlyIdeas.no Fact Sheet for Healthcare Providers: BankingDealers.co.za This test is not yet approved or cleared by the [place] FDA and has been authorized for detection and/or diagnosis of SARS-CoV-2 by FDA under an Emergency Use Authorization (EUA).  This EUA will remain in effect (meaning this test can be used) for the duration of the COVID-19 declaration  under Section 564(b)(1) of the Act, 21 U.S.C. section 360bbb-3(b)(1), unless the authorization is terminated or revoked sooner. Performed at [facility], [address] [place], [place] [address]   Lactic acid, plasma     Status: Abnormal   Collection Time: 07/27/18 11:26 PM  Result Value Ref Range   Lactic Acid, Venous 3.9 (HH) 0.5 - 1.9 mmol/L    Comment: CRITICAL RESULT CALLED TO, READ BACK BY AND VERIFIED WITH: Lethea Killings, RN @0007  07/28/18 [facility] Performed at [facility], [address], [place], [place] [address]   Blood  Culture (routine x 2)     Status: None (Preliminary result)   Collection Time: 07/27/18 11:26 PM  Result Value Ref Range   Specimen Description BLOOD LEFT ANTECUBITAL    Special Requests      BOTTLES DRAWN AEROBIC AND ANAEROBIC Blood Culture adequate volume   Culture      NO GROWTH < 12 HOURS Performed at [facility], [address], [place], [place] [address]    Report Status PENDING   Blood Culture (routine x 2)     Status: None (Preliminary result)   Collection Time: 07/27/18 11:28 PM  Result Value Ref Range   Specimen Description BLOOD RIGHT ANTECUBITAL    Special Requests      BOTTLES DRAWN AEROBIC AND ANAEROBIC Blood Culture adequate volume   Culture      NO GROWTH < 12 HOURS Performed at [facility], [address], [place], [place] [address]    Report Status PENDING   MRSA PCR Screening     Status: None   Collection Time: 07/28/18  2:13 AM  Result Value Ref Range   MRSA by PCR NEGATIVE NEGATIVE    Comment:        The GeneXpert MRSA Assay (FDA approved for NASAL specimens only), is one component of a comprehensive MRSA colonization surveillance program. It is not intended to diagnose MRSA infection nor to guide or monitor treatment for MRSA infections. Performed at [facility], [address] [place], [place] [address]   Lactic acid, plasma     Status: Abnormal   Collection Time: 07/28/18  3:09 AM  Result Value Ref Range   Lactic Acid, Venous 3.7 (HH) 0.5 - 1.9 mmol/L    Comment: CRITICAL RESULT CALLED TO, READ BACK BY AND VERIFIED WITH: H PETREAULT,RN @0335  07/28/18 MKELLY Performed at [facility], [address] [place], [place] [address]   Basic metabolic panel     Status: Abnormal   Collection Time: 07/28/18  3:09 AM  Result Value Ref Range   Sodium 134 (L) 135 - 145 mmol/L   Potassium 3.9 3.5 - 5.1 mmol/L   Chloride 95 (L) 98 - 111 mmol/L   CO2 23 22 - 32 mmol/L   Glucose, Bld 340 (H) 70 - 99 mg/dL   BUN 47 (H) 8 - 23 mg/dL   Creatinine, Ser 2.84  (H) 0.61 - 1.24 mg/dL   Calcium 8.5 (L) 8.9 - 10.3 mg/dL   GFR calc non Af Amer 22 (L) >60 mL/min   GFR calc Af Amer 26 (L) >60 mL/min   Anion gap 16 (H) 5 - 15    Comment: Performed at [facility], [address] [place],  [address]  CBC     Status: Abnormal   Collection Time: 07/28/18  3:09 AM  Result Value Ref Range   WBC 12.0 (H) 4.0 - 10.5 K/uL   RBC 3.92 (L) 4.22 - 5.81 MIL/uL   Hemoglobin  12.3 (L) 13.0 - 17.0 g/dL   HCT 37.5 (L) 39.0 - 52.0 %   MCV 95.7 80.0 - 100.0 fL   MCH 31.4 26.0 - 34.0 pg   MCHC 32.8 30.0 - 36.0 g/dL   RDW 15.3 11.5 - 15.5 %   Platelets 230 150 - 400 K/uL   nRBC 0.0 0.0 - 0.2 %    Comment: Performed at [facility], [address], [place], [place] [address]  Troponin I - Now Then Q6H     Status: Abnormal   Collection Time: 07/28/18  3:09 AM  Result Value Ref Range   Troponin I 0.32 (HH) <0.03 ng/mL    Comment: CRITICAL RESULT CALLED TO, READ BACK BY AND VERIFIED WITH: DANIELS,J AT 5:30AM ON 07/28/18 BY [facility] Performed at [facility], [address], [place], [place] [address]   Troponin I - Now Then Q6H     Status: Abnormal   Collection Time: 07/28/18  9:24 AM  Result Value Ref Range   Troponin I 0.33 (HH) <0.03 ng/mL    Comment: CRITICAL RESULT CALLED TO, READ BACK BY AND VERIFIED WITH: DISHMON,M AT 10:20AM ON 07/28/18 BY [facility] Performed at [facility], [address] [place], [place] [address]   Troponin I - Now Then Q6H     Status: Abnormal   Collection Time: 07/28/18  2:56 PM  Result Value Ref Range   Troponin I 0.29 (HH) <0.03 ng/mL    Comment: CRITICAL VALUE NOTED.  VALUE IS CONSISTENT WITH PREVIOUSLY REPORTED AND CALLED VALUE. Performed at [facility], [address], [place], [place] [address]   Basic metabolic panel     Status: Abnormal   Collection Time: 07/29/18  4:43 AM  Result Value Ref Range   Sodium 138 135 - 145 mmol/L   Potassium 3.2 (L) 3.5 - 5.1 mmol/L    Comment: DELTA CHECK NOTED   Chloride 97 (L)  98 - 111 mmol/L   CO2 26 22 - 32 mmol/L   Glucose, Bld 388 (H) 70 - 99 mg/dL   BUN 39 (H) 8 - 23 mg/dL   Creatinine, Ser 2.07 (H) 0.61 - 1.24 mg/dL   Calcium 9.0 8.9 - 10.3 mg/dL   GFR calc non Af Amer 32 (L) >60 mL/min   GFR calc Af Amer 38 (L) >60 mL/min   Anion gap 15 5 - 15    Comment: Performed at [facility], [address] [place], [place] [address]  CBC with Differential/Platelet     Status: Abnormal   Collection Time: 07/29/18  4:43 AM  Result Value Ref Range   WBC 8.6 4.0 - 10.5 K/uL   RBC 3.75 (L) 4.22 - 5.81 MIL/uL   Hemoglobin 11.6 (L) 13.0 - 17.0 g/dL   HCT 34.6 (L) 39.0 - 52.0 %   MCV 92.3 80.0 - 100.0 fL   MCH 30.9 26.0 - 34.0 pg   MCHC 33.5 30.0 - 36.0 g/dL   RDW 14.8 11.5 - 15.5 %   Platelets 208 150 - 400 K/uL   nRBC 0.0 0.0 - 0.2 %   Neutrophils Relative % 69 %   Neutro Abs 6.0 1.7 - 7.7 K/uL   Lymphocytes Relative 21 %   Lymphs Abs 1.8 0.7 - 4.0 K/uL   Monocytes Relative 9 %   Monocytes Absolute 0.8 0.1 - 1.0 K/uL   Eosinophils Relative 1 %   Eosinophils Absolute 0.0 0.0 - 0.5 K/uL   Basophils Relative 0 %   Basophils Absolute 0.0 0.0 -  0.1 K/uL   Immature Granulocytes 0 %   Abs Immature Granulocytes 0.03 0.00 - 0.07 K/uL    Comment: Performed at [facility], [address] [place], [place] [address]  Glucose, capillary     Status: Abnormal   Collection Time: 07/29/18  7:42 AM  Result Value Ref Range   Glucose-Capillary 359 (H) 70 - 99 mg/dL    ABGS No results for input(s): PHART, PO2ART, TCO2, HCO3 in the last 72 hours.  Invalid input(s): PCO2 CULTURES Recent Results (from the past 240 hour(s))  C difficile quick scan w PCR reflex     Status: None   Collection Time: 07/27/18 10:13 PM  Result Value Ref Range Status   C Diff antigen NEGATIVE NEGATIVE Final   C Diff toxin NEGATIVE NEGATIVE Final   C Diff interpretation No C. difficile detected.  Final    Comment: Performed at [facility], [address], [place], [place] [address]  SARS  Coronavirus 2 ([place]- Performed in [place] hospital lab), Hosp Order     Status: None   Collection Time: 07/27/18 11:20 PM  Result Value Ref Range Status   SARS Coronavirus 2 NEGATIVE NEGATIVE Final    Comment: (NOTE) If result is NEGATIVE SARS-CoV-2 target nucleic acids are NOT DETECTED. The SARS-CoV-2 RNA is generally detectable in upper and lower  respiratory specimens during the acute phase of infection. The lowest  concentration of SARS-CoV-2 viral copies this assay can detect is 250  copies / mL. A negative result does not preclude SARS-CoV-2 infection  and should not be used as the sole basis for treatment or other  patient management decisions.  A negative result may occur with  improper specimen collection / handling, submission of specimen other  than nasopharyngeal swab, presence of viral mutation(s) within the  areas targeted by this assay, and inadequate number of viral copies  (<250 copies / mL). A negative result must be combined with clinical  observations, patient history, and epidemiological information. If result is POSITIVE SARS-CoV-2 target nucleic acids are DETECTED. The SARS-CoV-2 RNA is generally detectable in upper and lower  respiratory specimens dur ing the acute phase of infection.  Positive  results are indicative of active infection with SARS-CoV-2.  Clinical  correlation with patient history and other diagnostic information is  necessary to determine patient infection status.  Positive results do  not rule out bacterial infection or co-infection with other viruses. If result is PRESUMPTIVE POSTIVE SARS-CoV-2 nucleic acids MAY BE PRESENT.   A presumptive positive result was obtained on the submitted specimen  and confirmed on repeat testing.  While 2019 novel coronavirus  (SARS-CoV-2) nucleic acids may be present in the submitted sample  additional confirmatory testing may be necessary for epidemiological  and / or clinical management purposes  to  differentiate between  SARS-CoV-2 and other Sarbecovirus currently known to infect humans.  If clinically indicated additional testing with an alternate test  methodology [phone]) is advised. The SARS-CoV-2 RNA is generally  detectable in upper and lower respiratory sp ecimens during the acute  phase of infection. The expected result is Negative. Fact Sheet for Patients:  StrictlyIdeas.no Fact Sheet for Healthcare Providers: BankingDealers.co.za This test is not yet approved or cleared by the [place] FDA and has been authorized for detection and/or diagnosis of SARS-CoV-2 by FDA under an Emergency Use Authorization (EUA).  This EUA will remain in effect (meaning this test can be used) for the duration of the COVID-19 declaration under Section 564(b)(1) of  the Act, 21 U.S.C. section 360bbb-3(b)(1), unless the authorization is terminated or revoked sooner. Performed at [facility], [address], [place], [place] [address]   Blood Culture (routine x 2)     Status: None (Preliminary result)   Collection Time: 07/27/18 11:26 PM  Result Value Ref Range Status   Specimen Description BLOOD LEFT ANTECUBITAL  Final   Special Requests   Final    BOTTLES DRAWN AEROBIC AND ANAEROBIC Blood Culture adequate volume   Culture   Final    NO GROWTH < 12 HOURS Performed at [facility], [address] [place], [place] [address]    Report Status PENDING  Incomplete  Blood Culture (routine x 2)     Status: None (Preliminary result)   Collection Time: 07/27/18 11:28 PM  Result Value Ref Range Status   Specimen Description BLOOD RIGHT ANTECUBITAL  Final   Special Requests   Final    BOTTLES DRAWN AEROBIC AND ANAEROBIC Blood Culture adequate volume   Culture   Final    NO GROWTH < 12 HOURS Performed at [facility], [address] [place], [place] [address]    Report Status PENDING  Incomplete  MRSA PCR Screening     Status: None   Collection  Time: 07/28/18  2:13 AM  Result Value Ref Range Status   MRSA by PCR NEGATIVE NEGATIVE Final    Comment:        The GeneXpert MRSA Assay (FDA approved for NASAL specimens only), is one component of a comprehensive MRSA colonization surveillance program. It is not intended to diagnose MRSA infection nor to guide or monitor treatment for MRSA infections. Performed at [facility], [address] [place],  [address]    Studies/Results: Dg Abd Acute W/chest  Result Date: 07/28/2018 CLINICAL DATA:  Generalized weakness EXAM: DG ABDOMEN ACUTE W/ 1V CHEST COMPARISON:  06/28/2018 FINDINGS: Cardiac shadows within normal limits. 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 The lungs are clear bilaterally. Scattered large and small bowel gas is noted. No free air is seen. No obstructive changes are noted. Degenerative changes of lumbar spine are noted. IMPRESSION: No acute abnormality in the chest and abdomen. Electronically Signed   By: Inez Catalina M.D.   On: 07/28/2018 00:36    Medications:  Prior to Admission:  Medications Prior to Admission  Medication Sig Dispense Refill Last Dose  . acetaminophen (TYLENOL) 500 MG tablet Take 1,000 mg by mouth daily as needed for moderate pain or headache.    07/27/2018 at Unknown time  . albuterol (PROAIR HFA) 108 (90 BASE) MCG/ACT inhaler Inhale 2 puffs into the lungs every 6 (six) hours as needed for wheezing or shortness of breath.    unknown  . allopurinol (ZYLOPRIM) 300 MG tablet Take 300 mg by mouth daily.     07/27/2018 at Unknown time  . alum & mag hydroxide-simeth (MAALOX/MYLANTA) 200-200-20 MG/5ML suspension Take 15 mLs by mouth every 6 (six) hours as needed for indigestion or heartburn.   unknown  . atorvastatin (LIPITOR) 20 MG tablet Take 20 mg by mouth at bedtime.   07/27/2018 at Unknown time  . cholecalciferol (VITAMIN D) 1000 units tablet Take 1,000 Units by mouth daily.   unknown  . fluticasone (FLONASE) 50 MCG/ACT nasal spray Place 2  sprays into both nostrils daily.    07/27/2018 at Unknown time  . furosemide (LASIX) 40 MG tablet Take 40 mg by mouth every morning. & 20 mg in the evening   07/27/2018 at Unknown time  .  gabapentin (NEURONTIN) 400 MG capsule Take 400 mg by mouth daily.    07/27/2018 at Unknown time  . hydrALAZINE (APRESOLINE) 100 MG tablet Take 1 tablet (100 mg total) by mouth 3 (three) times daily. 90 tablet 3 07/27/2018 at Unknown time  . HYDROcodone-acetaminophen (NORCO/VICODIN) 5-325 MG tablet Take 1 tablet by mouth every 6 (six) hours as needed.   07/27/2018 at Unknown time  . insulin lispro (HUMALOG KWIKPEN) 100 UNIT/ML KiwkPen Inject 18-20 Units into the skin as directed. Inject 2 to 18 units based on sliding scale as directed as needed for blood glucose over 200   07/27/2018 at Unknown time  . isosorbide mononitrate (IMDUR) 30 MG 24 hr tablet Take 1 tablet (30 mg total) by mouth daily. 90 tablet 0 07/27/2018 at Unknown time  . LANTUS SOLOSTAR 100 UNIT/ML Solostar Pen Inject 20 Units into the skin daily.    07/27/2018 at Unknown time  . linaclotide (LINZESS) 145 MCG CAPS capsule Take 1 capsule (145 mcg total) by mouth daily before breakfast. 30 capsule 5 07/27/2018 at Unknown time  . loratadine (CLARITIN) 10 MG tablet Take 10 mg by mouth daily.   07/27/2018 at Unknown time  . metoCLOPramide (REGLAN) 5 MG tablet Take 5 mg by mouth 3 (three) times daily before meals.   07/27/2018 at Unknown time  . metoprolol tartrate (LOPRESSOR) 100 MG tablet Take 100 mg by mouth 2 (two) times daily.   07/27/2018 at Unknown time  . Multiple Vitamin (MULTIVITAMIN WITH MINERALS) TABS tablet Take 1 tablet by mouth daily.   07/27/2018 at Unknown time  . ondansetron (ZOFRAN) 4 MG tablet Take 1 tablet (4 mg total) by mouth every 8 (eight) hours as needed for nausea or vomiting. 30 tablet 1 07/27/2018 at Unknown time  . pantoprazole (PROTONIX) 40 MG tablet Take 1 tablet (40 mg total) by mouth daily.   07/27/2018 at Unknown time  . potassium chloride  SA (K-DUR) 20 MEQ tablet Take 1 tablet (20 mEq total) by mouth 2 (two) times daily. 60 tablet 5 07/27/2018 at Unknown time  . tamsulosin (FLOMAX) 0.4 MG CAPS capsule Take 1 capsule by mouth daily.   07/27/2018 at Unknown time  . traZODone (DESYREL) 50 MG tablet Take 0.5 tablets (25 mg total) by mouth at bedtime as needed for sleep. 30 tablet 5 07/27/2018 at Unknown time  . vitamin C (ASCORBIC ACID) 500 MG tablet Take 500 mg by mouth 2 (two) times daily.   unknown   Scheduled: . allopurinol  100 mg Oral Daily  . atorvastatin  20 mg Oral QHS  . cholecalciferol  1,000 Units Oral Daily  . fluticasone  2 spray Each Nare Daily  . gabapentin  400 mg Oral Daily  . insulin aspart  0-15 Units Subcutaneous TID WC  . insulin aspart  0-5 Units Subcutaneous QHS  . insulin aspart  4 Units Subcutaneous TID WC  . insulin glargine  10 Units Subcutaneous QHS  . isosorbide mononitrate  30 mg Oral Daily  . loratadine  10 mg Oral Daily  . metoCLOPramide  5 mg Oral TID AC  . metoprolol tartrate  100 mg Oral BID  . pantoprazole  40 mg Oral Daily  . tamsulosin  0.4 mg Oral Daily   Continuous: . sodium chloride 75 mL/hr at 07/29/18 0400  . ciprofloxacin Stopped (07/28/18 2149)   GYK:[id number] **OR** acetaminophen, albuterol, HYDROcodone-acetaminophen, ondansetron **OR** ondansetron (ZOFRAN) IV, traZODone  Assesment: He was admitted with a urinary tract infection and he is being treated for  that.  Results of culture are still pending  He had mild sepsis and sepsis pathophysiology has resolved  He has diabetes and has been started on sliding scale and half his normal dose of Lantus  He had acute on chronic renal failure and his renal function is improved  He has chronic systolic heart failure but seems euvolemic at this point.  I am going to continue IV fluids for now but probably discontinue tomorrow.  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  Remain in stepdown today.    LOS: 1 day   Alonza Bogus 07/29/2018, 7:48 AM

## 2018-07-29 NOTE — TOC Progression Note (Signed)
Transition of Care [facility]) - Progression Note    Patient Details  Name: Frank Hoover MRN: [id number] Date of Birth: 1950/04/04  Transition of Care [facility]) CM/SW [place], [place] Phone Number: 07/29/2018, 3:57 PM  Clinical Narrative:   Following PT eval., Frank Hoover is open to going to SNF "so that I can walk when I go home."  He deferred to wife when we talked about options.  She was not available by phone either of the times we tried.  He preferred I talk to her in his presence.  Left him with list, and instructed him to go over it with her if she called.  In the meantime, got a PASSR number and worked up [employer].    Expected Discharge Plan: [place] Barriers to Discharge: No Barriers Identified  Expected Discharge Plan and Services Expected Discharge Plan: [place] In-house Referral: Clinical Social Work   Post Acute Care Choice: [place] arrangements for the past 2 months: Single Family Home                           HH Arranged: OT, PT, Nurse's Aide, RN [place] Agency: [place]         Social Determinants of Health (SDOH) Interventions    Readmission Risk Interventions Readmission Risk Prevention Plan 06/29/2018  Transportation Screening Complete  PCP or Specialist Appt within 3-5 Days Complete  HRI or [place] Complete  Social Work Consult for [place] Planning/Counseling Complete  Palliative Care Screening Not Applicable  Medication Review Press photographer) Complete  Some recent data might be hidden

## 2018-07-29 NOTE — Evaluation (Signed)
Physical Therapy Evaluation Patient Details Name: Frank Hoover MRN: [id number] DOB: Aug 11, 1950 Today's Date: 07/29/2018   History of Present Illness   Frank Hoover is a 68 y.o. male with medical history significant of COPD, CHF, chronic kidney disease, diabetes, hypertension comes in because he was constipated and then could not urinate.  He started given himself a several enemas at home but he started having problems with diarrhea.  He denies any fevers he denies any abdominal pain.  Patient found to have a UTI with acute kidney injury and referred for admission for such.    Clinical Impression  Patient is very weak with frequent leaning/falling over to the left when seated and standing, at severe risk for falls and loss balance when taking steps with RW.  Patient transferred to [facility] to finish a bowel movement and limited to a few unsteady labored steps with frequent loss of balance to transfer to chair and tolerated staying up in chair after therapy - nursing staff aware.  Patient will benefit from continued physical therapy in hospital and recommended venue below to increase strength, balance, endurance for safe ADLs and gait.    Follow Up Recommendations SNF;Supervision/Assistance - 24 hour;Supervision for mobility/OOB    Equipment Recommendations  3in1 (PT)    Recommendations for Other Services       Precautions / Restrictions Precautions Precautions: Fall Restrictions Weight Bearing Restrictions: No      Mobility  Bed Mobility Overal bed mobility: Needs Assistance Bed Mobility: Supine to Sit     Supine to sit: Mod assist     General bed mobility comments: slow labored movement  Transfers Overall transfer level: Needs assistance Equipment used: Rolling walker (2 wheeled) Transfers: Stand Pivot Transfers;Sit to/from Stand Sit to Stand: Mod assist Stand pivot transfers: Mod assist;Max assist       General transfer comment: very unsteady on feet with frequent  falling/leaning to the left  Ambulation/Gait Ambulation/Gait assistance: Max assist Gait Distance (Feet): 5 Feet Assistive device: Rolling walker (2 wheeled) Gait Pattern/deviations: Decreased step length - right;Decreased step length - left;Decreased stride length Gait velocity: slow   General Gait Details: limited to 5-6 slow labored unsteady side steps with frequent leaning/falling to the left with 1 loss of balance landing on bed, limited secondary to weakness, poor standing balance  Stairs            Wheelchair Mobility    Modified Rankin (Stroke Patients Only)       Balance Overall balance assessment: Needs assistance Sitting-balance support: Feet supported;No upper extremity supported Sitting balance-Leahy Scale: Poor Sitting balance - Comments: frequent falling over to the left Postural control: Left lateral lean Standing balance support: Bilateral upper extremity supported;During functional activity Standing balance-Leahy Scale: Poor Standing balance comment: using RW                             Pertinent Vitals/Pain Pain Assessment: Faces Faces Pain Scale: Hurts little more Pain Location: BLE  Pain Descriptors / Indicators: Sore Pain Intervention(s): Limited activity within patient's tolerance;Monitored during session    Home Living Family/patient expects to be discharged to:: Private residence Living Arrangements: Spouse/significant other Available Help at Discharge: Family;Available 24 hours/day Type of Home: Apartment Home Access: Level entry     Home Layout: One level Home Equipment: Walker - 4 wheels;Hospital bed Additional Comments: Patient states he needs a wheelchair and BSC    Prior Function Level of Independence: Needs assistance  Gait / Transfers Assistance Needed: household and short community distanced ambulator with Radiation protection practitioner, drives  ADL's / Homemaking Assistance Needed: assisted by family        Hand Dominance    Dominant Hand: Right    Extremity/Trunk Assessment   Upper Extremity Assessment Upper Extremity Assessment: Generalized weakness    Lower Extremity Assessment Lower Extremity Assessment: Generalized weakness    Cervical / Trunk Assessment Cervical / Trunk Assessment: Kyphotic  Communication   Communication: No difficulties  Cognition Arousal/Alertness: Awake/alert Behavior During Therapy: WFL for tasks assessed/performed Overall Cognitive Status: Within Functional Limits for tasks assessed                                        General Comments      Exercises     Assessment/Plan    PT Assessment Patient needs continued PT services  PT Problem List Decreased strength;Decreased activity tolerance;Decreased balance;Decreased mobility       PT Treatment Interventions Therapeutic exercise;Gait training;Stair training;Functional mobility training;Therapeutic activities;Patient/family education;Balance training    PT Goals (Current goals can be found in the Care Plan section)  Acute Rehab PT Goals Patient Stated Goal: return home after rehab PT Goal Formulation: With patient Time For Goal Achievement: 08/12/18 Potential to Achieve Goals: Good    Frequency Min 3X/week   Barriers to discharge        Co-evaluation               AM-PAC PT "6 Clicks" Mobility  Outcome Measure Help needed turning from your back to your side while in a flat bed without using bedrails?: A Lot Help needed moving from lying on your back to sitting on the side of a flat bed without using bedrails?: A Lot Help needed moving to and from a bed to a chair (including a wheelchair)?: A Lot Help needed standing up from a chair using your arms (e.g., wheelchair or bedside chair)?: A Lot Help needed to walk in hospital room?: A Lot Help needed climbing 3-5 steps with a railing? : Total 6 Click Score: 11    End of Session   Activity Tolerance: Patient tolerated treatment  well;Patient limited by fatigue Patient left: in chair;with call bell/phone within reach Nurse Communication: Mobility status PT Visit Diagnosis: Unsteadiness on feet (R26.81);Other abnormalities of gait and mobility (R26.89);Muscle weakness (generalized) (M62.81)    Time: 1610-9604 PT Time Calculation (min) (ACUTE ONLY): 35 min   Charges:   PT Evaluation $PT Eval Moderate Complexity: 1 Mod PT Treatments $Therapeutic Activity: 23-37 mins        2:26 PM, 07/29/18 Lonell Grandchild, MPT Physical Therapist with [facility] [phone] office [phone] mobile phone

## 2018-07-29 NOTE — Progress Notes (Signed)
Inpatient Diabetes Program Recommendations  AACE/ADA: New Consensus Statement on Inpatient Glycemic Control (2015)  Target Ranges:  Prepandial:   less than 140 mg/dL      Peak postprandial:   less than 180 mg/dL (1-2 hours)      Critically ill patients:  140 - 180 mg/dL   Lab Results  Component Value Date   GLUCAP 273 (H) 07/29/2018   HGBA1C 7.8 (H) 08/14/2015    Review of Glycemic Control  Diabetes history: DM2 Outpatient Diabetes medications: Lantus 20 units bid + Humalog 18-20 units bid meal coverage Current orders for Inpatient glycemic control: Lantus 10 units qd + Novolog 5 units tid meal coverage if eats 50% + Novolog moderate correction tid + hs  Inpatient Diabetes Program Recommendations:   Spoke with patient @ bedside and discussed timing of insulin. Patient states he takes his insulin as prescribed. Reviewed basic plate method nutrition information. Patient shared he mostly drinks water @ home and limits sugary drinks but likes to eat sweets. Reviewed hypoglycemia protocol with patient return verbalization.  -Increase Lantus to bid -A1c to determine prehospital glycemic control  Thank you, Nani Gasser. Keyon Winnick, RN, MSN, CDE  Diabetes Coordinator Inpatient Glycemic Control Team Team Pager [phone] (8am-5pm) 07/29/2018 5:04 PM

## 2018-07-29 NOTE — Progress Notes (Signed)
Patient's BP increasing throughout shift, HR has increased to 120, nonsustained on 2 occasions. Home meds not started. MD made aware. Orders placed and followed.

## 2018-07-30 LAB — URINE CULTURE: Culture: >=100000 — AB

## 2018-07-30 LAB — GLUCOSE, CAPILLARY
Glucose-Capillary: 273 mg/dL — ABNORMAL HIGH (ref 70–99)
Glucose-Capillary: 379 mg/dL — ABNORMAL HIGH (ref 70–99)
Glucose-Capillary: 186 mg/dL — ABNORMAL HIGH (ref 70–99)
Glucose-Capillary: 230 mg/dL — ABNORMAL HIGH (ref 70–99)

## 2018-07-30 MED ORDER — SODIUM CHLORIDE 0.9 % IV SOLN
1.0000 g | INTRAVENOUS | Status: DC
  Administered 2018-07-30 – 2018-08-02 (×4): 1 g via INTRAVENOUS
  Filled 2018-07-30 (×4): qty 10

## 2018-07-30 MED ORDER — INSULIN GLARGINE 100 UNIT/ML ~~LOC~~ SOLN
20.0000 [IU] | Freq: Every day | SUBCUTANEOUS | Status: DC
  Administered 2018-07-30: 23:00:00 20 [IU] via SUBCUTANEOUS
  Filled 2018-07-30 (×4): qty 0.2

## 2018-07-30 NOTE — Progress Notes (Signed)
Subjective: He says he feels better.  He has no new complaints.  His blood sugar has still been up so I have put him back on 20 units of Lantus.  He did not get his Lantus until last night.  He is not having diarrhea now and does not feel like he is constipated.  He generally feels better.  Objective: Vital signs in last 24 hours: Temp:  [98.1 F (36.7 C)-99.4 F (37.4 C)] 98.1 F (36.7 C) (05/28 0552) Pulse Rate:  [71-78] 71 (05/28 0552) Resp:  [15-16] 16 (05/28 0552) BP: (128-154)/(75-85) 154/80 (05/28 0552) SpO2:  [97 %-100 %] 99 % (05/28 0810) Weight change:  Last BM Date: 07/28/18  Intake/Output from previous day: 05/27 0701 - 05/28 0700 In: 1039.1 [P.O.:240; I.V.:599.1; IV Piggyback:200] Out: 1200 [Urine:1200]  PHYSICAL EXAM General appearance: alert, cooperative and no distress Resp: clear to auscultation bilaterally Cardio: regular rate and rhythm, S1, S2 normal, no murmur, click, rub or gallop GI: soft, non-tender; bowel sounds normal; no masses,  no organomegaly Extremities: He has weakness of the lower extremities which is chronic  Lab Results:  Results for orders placed or performed during the hospital encounter of 07/27/18 (from the past 48 hour(s))  Troponin I - Now Then Q6H     Status: Abnormal   Collection Time: 07/28/18  9:24 AM  Result Value Ref Range   Troponin I 0.33 (HH) <0.03 ng/mL    Comment: CRITICAL RESULT CALLED TO, READ BACK BY AND VERIFIED WITH: DISHMON,M AT 10:20AM ON 07/28/18 BY [facility] Performed at [facility], [address] [place], [place] [address]   Troponin I - Now Then Q6H     Status: Abnormal   Collection Time: 07/28/18  2:56 PM  Result Value Ref Range   Troponin I 0.29 (HH) <0.03 ng/mL    Comment: CRITICAL VALUE NOTED.  VALUE IS CONSISTENT WITH PREVIOUSLY REPORTED AND CALLED VALUE. Performed at [facility], [address] [place], [place] [address]   Basic metabolic panel     Status: Abnormal   Collection Time: 07/29/18   4:43 AM  Result Value Ref Range   Sodium 138 135 - 145 mmol/L   Potassium 3.2 (L) 3.5 - 5.1 mmol/L    Comment: DELTA CHECK NOTED   Chloride 97 (L) 98 - 111 mmol/L   CO2 26 22 - 32 mmol/L   Glucose, Bld 388 (H) 70 - 99 mg/dL   BUN 39 (H) 8 - 23 mg/dL   Creatinine, Ser 2.07 (H) 0.61 - 1.24 mg/dL   Calcium 9.0 8.9 - 10.3 mg/dL   GFR calc non Af Amer 32 (L) >60 mL/min   GFR calc Af Amer 38 (L) >60 mL/min   Anion gap 15 5 - 15    Comment: Performed at [facility], [address] [place], [place] [address]  CBC with Differential/Platelet     Status: Abnormal   Collection Time: 07/29/18  4:43 AM  Result Value Ref Range   WBC 8.6 4.0 - 10.5 K/uL   RBC 3.75 (L) 4.22 - 5.81 MIL/uL   Hemoglobin 11.6 (L) 13.0 - 17.0 g/dL   HCT 34.6 (L) 39.0 - 52.0 %   MCV 92.3 80.0 - 100.0 fL   MCH 30.9 26.0 - 34.0 pg   MCHC 33.5 30.0 - 36.0 g/dL   RDW 14.8 11.5 - 15.5 %   Platelets 208 150 - 400 K/uL   nRBC 0.0 0.0 - 0.2 %   Neutrophils Relative % 69 %  Neutro Abs 6.0 1.7 - 7.7 K/uL   Lymphocytes Relative 21 %   Lymphs Abs 1.8 0.7 - 4.0 K/uL   Monocytes Relative 9 %   Monocytes Absolute 0.8 0.1 - 1.0 K/uL   Eosinophils Relative 1 %   Eosinophils Absolute 0.0 0.0 - 0.5 K/uL   Basophils Relative 0 %   Basophils Absolute 0.0 0.0 - 0.1 K/uL   Immature Granulocytes 0 %   Abs Immature Granulocytes 0.03 0.00 - 0.07 K/uL    Comment: Performed at [facility], [address], [place], [place] [address]  Glucose, capillary     Status: Abnormal   Collection Time: 07/29/18  7:42 AM  Result Value Ref Range   Glucose-Capillary 359 (H) 70 - 99 mg/dL  Glucose, capillary     Status: Abnormal   Collection Time: 07/29/18 11:18 AM  Result Value Ref Range   Glucose-Capillary 334 (H) 70 - 99 mg/dL  Glucose, capillary     Status: Abnormal   Collection Time: 07/29/18  4:37 PM  Result Value Ref Range   Glucose-Capillary 273 (H) 70 - 99 mg/dL  Glucose, capillary     Status: Abnormal   Collection Time: 07/29/18   9:01 PM  Result Value Ref Range   Glucose-Capillary 283 (H) 70 - 99 mg/dL  Glucose, capillary     Status: Abnormal   Collection Time: 07/30/18  7:32 AM  Result Value Ref Range   Glucose-Capillary 273 (H) 70 - 99 mg/dL    ABGS No results for input(s): PHART, PO2ART, TCO2, HCO3 in the last 72 hours.  Invalid input(s): PCO2 CULTURES Recent Results (from the past 240 hour(s))  Urine culture     Status: Abnormal (Preliminary result)   Collection Time: 07/27/18  9:20 PM  Result Value Ref Range Status   Specimen Description   Final    URINE, CATHETERIZED Performed at [facility], [address], [place], [place] [address]    Special Requests   Final    NONE Performed at [facility], [address] [place] [address], [place], [place] [address]    Culture (A)  Final    >=100,000 COLONIES/mL PROTEUS MIRABILIS >=100,000 COLONIES/mL ESCHERICHIA COLI SUSCEPTIBILITIES TO FOLLOW Performed at [facility] [address] [place], [place] [address]    Report Status PENDING  Incomplete  C difficile quick scan w PCR reflex     Status: None   Collection Time: 07/27/18 10:13 PM  Result Value Ref Range Status   C Diff antigen NEGATIVE NEGATIVE Final   C Diff toxin NEGATIVE NEGATIVE Final   C Diff interpretation No C. difficile detected.  Final    Comment: Performed at [facility], [address], [place], [place] [address]  Gastrointestinal Panel by PCR , Stool     Status: None   Collection Time: 07/27/18 10:13 PM  Result Value Ref Range Status   Campylobacter species NOT DETECTED NOT DETECTED Final   Plesimonas shigelloides NOT DETECTED NOT DETECTED Final   Salmonella species NOT DETECTED NOT DETECTED Final   Yersinia enterocolitica NOT DETECTED NOT DETECTED Final   Vibrio species NOT DETECTED NOT DETECTED Final   Vibrio cholerae NOT DETECTED NOT DETECTED Final   Enteroaggregative E coli (EAEC) NOT DETECTED NOT DETECTED Final   Enteropathogenic E coli (EPEC) NOT DETECTED NOT DETECTED Final    Enterotoxigenic E coli (ETEC) NOT DETECTED NOT DETECTED Final   Shiga like toxin producing E coli (STEC) NOT DETECTED NOT DETECTED Final   Shigella/Enteroinvasive E coli (EIEC) NOT DETECTED  NOT DETECTED Final   Cryptosporidium NOT DETECTED NOT DETECTED Final   Cyclospora cayetanensis NOT DETECTED NOT DETECTED Final   Entamoeba histolytica NOT DETECTED NOT DETECTED Final   Giardia lamblia NOT DETECTED NOT DETECTED Final   Adenovirus F40/41 NOT DETECTED NOT DETECTED Final   Astrovirus NOT DETECTED NOT DETECTED Final   Norovirus GI/GII NOT DETECTED NOT DETECTED Final   Rotavirus A NOT DETECTED NOT DETECTED Final   Sapovirus (I, II, IV, and V) NOT DETECTED NOT DETECTED Final    Comment: Performed at [facility], [address] [place], [place] [address]  SARS Coronavirus 2 (CEPHEID- Performed in [facility] hospital lab), Hosp Order     Status: None   Collection Time: 07/27/18 11:20 PM  Result Value Ref Range Status   SARS Coronavirus 2 NEGATIVE NEGATIVE Final    Comment: (NOTE) If result is NEGATIVE SARS-CoV-2 target nucleic acids are NOT DETECTED. The SARS-CoV-2 RNA is generally detectable in upper and lower  respiratory specimens during the acute phase of infection. The lowest  concentration of SARS-CoV-2 viral copies this assay can detect is 250  copies / mL. A negative result does not preclude SARS-CoV-2 infection  and should not be used as the sole basis for treatment or other  patient management decisions.  A negative result may occur with  improper specimen collection / handling, submission of specimen other  than nasopharyngeal swab, presence of viral mutation(s) within the  areas targeted by this assay, and inadequate number of viral copies  (<250 copies / mL). A negative result must be combined with clinical  observations, patient history, and epidemiological information. If result is POSITIVE SARS-CoV-2 target nucleic acids are DETECTED. The SARS-CoV-2 RNA is  generally detectable in upper and lower  respiratory specimens dur ing the acute phase of infection.  Positive  results are indicative of active infection with SARS-CoV-2.  Clinical  correlation with patient history and other diagnostic information is  necessary to determine patient infection status.  Positive results do  not rule out bacterial infection or co-infection with other viruses. If result is PRESUMPTIVE POSTIVE SARS-CoV-2 nucleic acids MAY BE PRESENT.   A presumptive positive result was obtained on the submitted specimen  and confirmed on repeat testing.  While 2019 novel coronavirus  (SARS-CoV-2) nucleic acids may be present in the submitted sample  additional confirmatory testing may be necessary for epidemiological  and / or clinical management purposes  to differentiate between  SARS-CoV-2 and other Sarbecovirus currently known to infect humans.  If clinically indicated additional testing with an alternate test  methodology [phone]) is advised. The SARS-CoV-2 RNA is generally  detectable in upper and lower respiratory sp ecimens during the acute  phase of infection. The expected result is Negative. Fact Sheet for Patients:  StrictlyIdeas.no Fact Sheet for Healthcare Providers: BankingDealers.co.za This test is not yet approved or cleared by the [place] FDA and has been authorized for detection and/or diagnosis of SARS-CoV-2 by FDA under an Emergency Use Authorization (EUA).  This EUA will remain in effect (meaning this test can be used) for the duration of the COVID-19 declaration under Section 564(b)(1) of the Act, 21 U.S.C. section 360bbb-3(b)(1), unless the authorization is terminated or revoked sooner. Performed at [facility], [address], [place], [place] [address]   Blood Culture (routine x 2)     Status: None (Preliminary result)   Collection Time: 07/27/18 11:26 PM  Result Value Ref Range Status    Specimen Description BLOOD LEFT ANTECUBITAL  Final   Special Requests   Final    BOTTLES DRAWN AEROBIC AND ANAEROBIC Blood Culture adequate volume   Culture   Final    NO GROWTH 3 DAYS Performed at [facility], [address], [place], [place] [address]    Report Status PENDING  Incomplete  Blood Culture (routine x 2)     Status: None (Preliminary result)   Collection Time: 07/27/18 11:28 PM  Result Value Ref Range Status   Specimen Description BLOOD RIGHT ANTECUBITAL  Final   Special Requests   Final    BOTTLES DRAWN AEROBIC AND ANAEROBIC Blood Culture adequate volume   Culture   Final    NO GROWTH 3 DAYS Performed at [facility], [address] [place], [place] [address]    Report Status PENDING  Incomplete  MRSA PCR Screening     Status: None   Collection Time: 07/28/18  2:13 AM  Result Value Ref Range Status   MRSA by PCR NEGATIVE NEGATIVE Final    Comment:        The GeneXpert MRSA Assay (FDA approved for NASAL specimens only), is one component of a comprehensive MRSA colonization surveillance program. It is not intended to diagnose MRSA infection nor to guide or monitor treatment for MRSA infections. Performed at [facility], [address] [place], [place] [address]    Studies/Results: No results found.  Medications:  Prior to Admission:  Medications Prior to Admission  Medication Sig Dispense Refill Last Dose  . acetaminophen (TYLENOL) 500 MG tablet Take 1,000 mg by mouth daily as needed for moderate pain or headache.    07/27/2018 at Unknown time  . albuterol (PROAIR HFA) 108 (90 BASE) MCG/ACT inhaler Inhale 2 puffs into the lungs every 6 (six) hours as needed for wheezing or shortness of breath.    unknown  . allopurinol (ZYLOPRIM) 300 MG tablet Take 300 mg by mouth daily.     07/27/2018 at Unknown time  . alum & mag hydroxide-simeth (MAALOX/MYLANTA) 200-200-20 MG/5ML suspension Take 15 mLs by mouth every 6 (six) hours as needed for indigestion or heartburn.    unknown  . atorvastatin (LIPITOR) 20 MG tablet Take 20 mg by mouth at bedtime.   07/27/2018 at Unknown time  . cholecalciferol (VITAMIN D) 1000 units tablet Take 1,000 Units by mouth daily.   unknown  . fluticasone (FLONASE) 50 MCG/ACT nasal spray Place 2 sprays into both nostrils daily.    07/27/2018 at Unknown time  . furosemide (LASIX) 40 MG tablet Take 40 mg by mouth every morning. & 20 mg in the evening   07/27/2018 at Unknown time  . gabapentin (NEURONTIN) 400 MG capsule Take 400 mg by mouth daily.    07/27/2018 at Unknown time  . hydrALAZINE (APRESOLINE) 100 MG tablet Take 1 tablet (100 mg total) by mouth 3 (three) times daily. 90 tablet 3 07/27/2018 at Unknown time  . HYDROcodone-acetaminophen (NORCO/VICODIN) 5-325 MG tablet Take 1 tablet by mouth every 6 (six) hours as needed.   07/27/2018 at Unknown time  . insulin lispro (HUMALOG KWIKPEN) 100 UNIT/ML KiwkPen Inject 18-20 Units into the skin as directed. Inject 2 to 18 units based on sliding scale as directed as needed for blood glucose over 200   07/27/2018 at Unknown time  . isosorbide mononitrate (IMDUR) 30 MG 24 hr tablet Take 1 tablet (30 mg total) by mouth daily. 90 tablet 0 07/27/2018 at Unknown time  . LANTUS SOLOSTAR 100 UNIT/ML Solostar Pen Inject 20 Units into the skin  daily.    07/27/2018 at Unknown time  . linaclotide (LINZESS) 145 MCG CAPS capsule Take 1 capsule (145 mcg total) by mouth daily before breakfast. 30 capsule 5 07/27/2018 at Unknown time  . loratadine (CLARITIN) 10 MG tablet Take 10 mg by mouth daily.   07/27/2018 at Unknown time  . metoCLOPramide (REGLAN) 5 MG tablet Take 5 mg by mouth 3 (three) times daily before meals.   07/27/2018 at Unknown time  . metoprolol tartrate (LOPRESSOR) 100 MG tablet Take 100 mg by mouth 2 (two) times daily.   07/27/2018 at Unknown time  . Multiple Vitamin (MULTIVITAMIN WITH MINERALS) TABS tablet Take 1 tablet by mouth daily.   07/27/2018 at Unknown time  . ondansetron (ZOFRAN) 4 MG tablet Take 1  tablet (4 mg total) by mouth every 8 (eight) hours as needed for nausea or vomiting. 30 tablet 1 07/27/2018 at Unknown time  . pantoprazole (PROTONIX) 40 MG tablet Take 1 tablet (40 mg total) by mouth daily.   07/27/2018 at Unknown time  . potassium chloride SA (K-DUR) 20 MEQ tablet Take 1 tablet (20 mEq total) by mouth 2 (two) times daily. 60 tablet 5 07/27/2018 at Unknown time  . tamsulosin (FLOMAX) 0.4 MG CAPS capsule Take 1 capsule by mouth daily.   07/27/2018 at Unknown time  . traZODone (DESYREL) 50 MG tablet Take 0.5 tablets (25 mg total) by mouth at bedtime as needed for sleep. 30 tablet 5 07/27/2018 at Unknown time  . vitamin C (ASCORBIC ACID) 500 MG tablet Take 500 mg by mouth 2 (two) times daily.   unknown   Scheduled: . allopurinol  100 mg Oral Daily  . atorvastatin  20 mg Oral QHS  . cholecalciferol  1,000 Units Oral Daily  . fluticasone  2 spray Each Nare Daily  . gabapentin  400 mg Oral Daily  . insulin aspart  0-15 Units Subcutaneous TID WC  . insulin aspart  0-5 Units Subcutaneous QHS  . insulin aspart  4 Units Subcutaneous TID WC  . insulin glargine  20 Units Subcutaneous QHS  . isosorbide mononitrate  30 mg Oral Daily  . loratadine  10 mg Oral Daily  . metoCLOPramide  5 mg Oral TID AC  . metoprolol tartrate  100 mg Oral BID  . pantoprazole  40 mg Oral Daily  . tamsulosin  0.4 mg Oral Daily   Continuous: . ciprofloxacin 400 mg (07/29/18 2302)   NOM:[id number] **OR** acetaminophen, albuterol, HYDROcodone-acetaminophen, ondansetron **OR** ondansetron (ZOFRAN) IV, traZODone  Assesment: He was admitted with a urinary tract infection.  He has had urinary retention and has Foley catheter in.  I discussed the situation with urology and they will plan to see him as an outpatient to see if they can get the catheter out but would plan on sending him home with catheter for now.  Urine culture is positive but we do not have results of sensitivity  He has COPD at baseline.  This is  stable  He has chronic systolic heart failure and I have DC'd IV fluids now  He has an implantable cardioverter further/defibrillator and that has not fired  He has acute on chronic renal failure and that seems to be improving.  I think he was probably dehydrated on admission  He has some intellectual disability at baseline  He has trouble walking and PT has suggested that she has skilled care facility but he tells me this morning that he thinks he can manage at home Principal Problem:   Lower  urinary tract infectious disease Active Problems:   COPD (chronic obstructive pulmonary disease) (HCC)   SVT (supraventricular tachycardia) (HCC)   Debility   Acute renal failure superimposed on stage 3 chronic kidney disease (HCC)   Chronic systolic CHF (congestive heart failure) ([place])   ICD (implantable cardioverter-defibrillator) in place    Plan: Continue current treatments.  Discontinue IV fluids.    LOS: 2 days   Alonza Bogus 07/30/2018, 8:18 AM

## 2018-07-30 NOTE — Clinical Social Work Note (Signed)
Pt states he spoke to wife last night, and they agreed together that he would return home rather than pursue SNF.  Stated that she is arranging for additional care in the home in anticipation of his return there.  He is very happy not only about that news, but that he was told by Dr that if all goes well today, he will be d/ced tomorrow.  He and I attempted to call wife at both her numbers to confirm.  Cell phone number [phone] does not go through, and home [phone] is busy.  Have since tried numbers again with same result.

## 2018-07-31 ENCOUNTER — Other Ambulatory Visit: Payer: Self-pay | Admitting: Internal Medicine

## 2018-07-31 LAB — HEMOGLOBIN A1C
Hgb A1c MFr Bld: 7.2 % — ABNORMAL HIGH (ref 4.8–5.6)
Mean Plasma Glucose: 159.94 mg/dL

## 2018-07-31 LAB — GLUCOSE, CAPILLARY
Glucose-Capillary: 253 mg/dL — ABNORMAL HIGH (ref 70–99)
Glucose-Capillary: 311 mg/dL — ABNORMAL HIGH (ref 70–99)
Glucose-Capillary: 239 mg/dL — ABNORMAL HIGH (ref 70–99)
Glucose-Capillary: 91 mg/dL (ref 70–99)

## 2018-07-31 MED ORDER — INSULIN GLARGINE 100 UNIT/ML ~~LOC~~ SOLN
20.0000 [IU] | Freq: Two times a day (BID) | SUBCUTANEOUS | Status: DC
  Administered 2018-07-31 – 2018-08-02 (×5): 20 [IU] via SUBCUTANEOUS
  Filled 2018-07-31 (×7): qty 0.2

## 2018-07-31 NOTE — Progress Notes (Signed)
Physical Therapy Treatment Patient Details Name: Frank Hoover MRN: [id number] DOB: November 15, 1950 Today's Date: 07/31/2018    History of Present Illness  Frank Hoover is a 68 y.o. male with medical history significant of COPD, CHF, chronic kidney disease, diabetes, hypertension comes in because he was constipated and then could not urinate.  He started given himself a several enemas at home but he started having problems with diarrhea.  He denies any fevers he denies any abdominal pain.  Patient found to have a UTI with acute kidney injury and referred for admission for such.    PT Comments    Patient requires less assistance for supine to sitting and scooting up to bedside without leaning to left side.  Patient able to complete BLE ROM/strengthening exercises while seated at bedside with occasional leaning backwards especially when completing LAQ's, demonstrates increased endurance/distance for taking steps at bedside, but has difficulty to posterior leaning with loss of balance when attempting to take backward steps.  Patient tolerated sitting up in chair after therapy - RN aware.  Patient will benefit from continued physical therapy in hospital and recommended venue below to increase strength, balance, endurance for safe ADLs and gait.    Follow Up Recommendations  SNF;Supervision/Assistance - 24 hour;Supervision for mobility/OOB     Equipment Recommendations  3in1 (PT)    Recommendations for Other Services       Precautions / Restrictions Precautions Precautions: Fall Restrictions Weight Bearing Restrictions: No    Mobility  Bed Mobility Overal bed mobility: Needs Assistance Bed Mobility: Supine to Sit     Supine to sit: Min assist     General bed mobility comments: demonstrates improvement for using BUE for supine to sitting and no leaning to the left when sitting up  Transfers Overall transfer level: Needs assistance Equipment used: Rolling walker (2  wheeled) Transfers: Sit to/from [facility] Sit to Stand: Min assist;Mod assist Stand pivot transfers: Mod assist       General transfer comment: unsteady on feet with frequent backward leaning and near falls during transfers  Ambulation/Gait Ambulation/Gait assistance: Mod assist;Max assist Gait Distance (Feet): 5 Feet Assistive device: Rolling walker (2 wheeled) Gait Pattern/deviations: Decreased step length - right;Decreased step length - left;Decreased stride length Gait velocity: slow   General Gait Details: limited to 6-7 unsteady slow labored steps at bedside with most difficulty stepping backwards resulting in loss of balance due to posterior lean   Stairs             Wheelchair Mobility    Modified Rankin (Stroke Patients Only)       Balance Overall balance assessment: Needs assistance Sitting-balance support: Feet supported;No upper extremity supported Sitting balance-Leahy Scale: Fair Sitting balance - Comments: good return for keeping trunk in midline, but tends to lean backwards while completing BLE exercises Postural control: Posterior lean Standing balance support: Bilateral upper extremity supported;During functional activity Standing balance-Leahy Scale: Poor Standing balance comment: fair/poor using RW                            Cognition Arousal/Alertness: Awake/alert Behavior During Therapy: WFL for tasks assessed/performed Overall Cognitive Status: Within Functional Limits for tasks assessed                                        Exercises General Exercises - Lower Extremity Long  Arc Quad: Seated;AROM;Strengthening;Both;10 reps Hip Flexion/Marching: Seated;Strengthening;AROM;Both;10 reps Toe Raises: Seated;Strengthening;AROM;Both;10 reps Heel Raises: Seated;AROM;Strengthening;Both;10 reps    General Comments        Pertinent Vitals/Pain Pain Assessment: No/denies pain    Home Living                       Prior Function            PT Goals (current goals can now be found in the care plan section) Acute Rehab PT Goals Patient Stated Goal: return home with family to assist PT Goal Formulation: With patient Time For Goal Achievement: 08/12/18 Potential to Achieve Goals: Good Progress towards PT goals: Progressing toward goals    Frequency    Min 3X/week      PT Plan Current plan remains appropriate    Co-evaluation              AM-PAC PT "6 Clicks" Mobility   Outcome Measure  Help needed turning from your back to your side while in a flat bed without using bedrails?: A Little Help needed moving from lying on your back to sitting on the side of a flat bed without using bedrails?: A Little Help needed moving to and from a bed to a chair (including a wheelchair)?: A Lot Help needed standing up from a chair using your arms (e.g., wheelchair or bedside chair)?: A Lot Help needed to walk in hospital room?: A Lot Help needed climbing 3-5 steps with a railing? : Total 6 Click Score: 13    End of Session Equipment Utilized During Treatment: Gait belt Activity Tolerance: Patient tolerated treatment well;Patient limited by fatigue Patient left: in chair;with call bell/phone within reach;with chair alarm set Nurse Communication: Mobility status PT Visit Diagnosis: Unsteadiness on feet (R26.81);Other abnormalities of gait and mobility (R26.89);Muscle weakness (generalized) (M62.81)     Time: 4982-6415 PT Time Calculation (min) (ACUTE ONLY): 31 min  Charges:  $Therapeutic Exercise: 8-22 mins $Therapeutic Activity: 8-22 mins                     1:39 PM, 07/31/18 Lonell Grandchild, MPT Physical Therapist with [facility] [phone] office [phone] mobile phone

## 2018-07-31 NOTE — Progress Notes (Deleted)
Inpatient Diabetes Program Recommendations  AACE/ADA: New Consensus Statement on Inpatient Glycemic Control (2015)  Target Ranges:  Prepandial:   less than 140 mg/dL      Peak postprandial:   less than 180 mg/dL (1-2 hours)      Critically ill patients:  140 - 180 mg/dL   Lab Results  Component Value Date   GLUCAP 311 (H) 07/31/2018   HGBA1C 7.8 (H) 08/14/2015    Review of Glycemic Control Results for LIVINGSTON, DENNER (MRN [id number]) as of 07/31/2018 13:44  Ref. Range 07/30/2018 16:12 07/30/2018 21:47 07/31/2018 07:32 07/31/2018 11:05  Glucose-Capillary Latest Ref Range: 70 - 99 mg/dL 186 (H) 230 (H) 253 (H) 311 (H)   Diabetes history:DM2 Outpatient Diabetes medications:Lantus 20 units bid + Humalog 18-20 units bid meal coverage Current orders for Inpatient glycemic control:Lantus 20 units bid + Novolog 4 units tid meal coverage if eats 50% + Novolog moderate correction tid + hs  Inpatient Diabetes Program Recommendations:    Consider increasing Lantus to 24 units BID and increasing meal coverage to Novolog 8 units TID (assuming that patient is consuming >50% of meal).  Thanks, Bronson Curb, MSN, RNC-OB Diabetes Coordinator [phone] (8a-5p)

## 2018-07-31 NOTE — Care Management Important Message (Signed)
Important Message  Patient Details  Name: Frank Hoover MRN: [id number] Date of Birth: December 12, 1950   Medicare Important Message Given:  Yes    Tommy Medal 07/31/2018, 2:31 PM

## 2018-07-31 NOTE — Progress Notes (Signed)
Subjective: He says he feels better.  He has had a little bit of diarrhea.  He is growing 2 organisms in his urine I like him to get at least 2 or 3 days of IV antibiotics that will cover this before discharge since he has an indwelling Foley catheter.  Objective: Vital signs in last 24 hours: Temp:  [98.4 F (36.9 C)-98.7 F (37.1 C)] 98.5 F (36.9 C) (05/29 0606) Pulse Rate:  [70-73] 72 (05/29 0606) Resp:  [16-18] 18 (05/29 0606) BP: (121-176)/(78-97) 176/97 (05/29 0606) SpO2:  [97 %-100 %] 100 % (05/29 0606) Weight change:  Last BM Date: 07/31/18  Intake/Output from previous day: 05/28 0701 - 05/29 0700 In: 504 [P.O.:504] Out: 700 [Urine:700]  PHYSICAL EXAM General appearance: alert, cooperative and no distress Resp: clear to auscultation bilaterally Cardio: regular rate and rhythm, S1, S2 normal, no murmur, click, rub or gallop GI: soft, non-tender; bowel sounds normal; no masses,  no organomegaly Extremities: extremities normal, atraumatic, no cyanosis or edema  Lab Results:  Results for orders placed or performed during the hospital encounter of 07/27/18 (from the past 48 hour(s))  Glucose, capillary     Status: Abnormal   Collection Time: 07/29/18 11:18 AM  Result Value Ref Range   Glucose-Capillary 334 (H) 70 - 99 mg/dL  Glucose, capillary     Status: Abnormal   Collection Time: 07/29/18  4:37 PM  Result Value Ref Range   Glucose-Capillary 273 (H) 70 - 99 mg/dL  Glucose, capillary     Status: Abnormal   Collection Time: 07/29/18  9:01 PM  Result Value Ref Range   Glucose-Capillary 283 (H) 70 - 99 mg/dL  Glucose, capillary     Status: Abnormal   Collection Time: 07/30/18  7:32 AM  Result Value Ref Range   Glucose-Capillary 273 (H) 70 - 99 mg/dL  Glucose, capillary     Status: Abnormal   Collection Time: 07/30/18 11:23 AM  Result Value Ref Range   Glucose-Capillary 379 (H) 70 - 99 mg/dL  Glucose, capillary     Status: Abnormal   Collection Time: 07/30/18   4:12 PM  Result Value Ref Range   Glucose-Capillary 186 (H) 70 - 99 mg/dL  Glucose, capillary     Status: Abnormal   Collection Time: 07/30/18  9:47 PM  Result Value Ref Range   Glucose-Capillary 230 (H) 70 - 99 mg/dL  Glucose, capillary     Status: Abnormal   Collection Time: 07/31/18  7:32 AM  Result Value Ref Range   Glucose-Capillary 253 (H) 70 - 99 mg/dL    ABGS No results for input(s): PHART, PO2ART, TCO2, HCO3 in the last 72 hours.  Invalid input(s): PCO2 CULTURES Recent Results (from the past 240 hour(s))  Urine culture     Status: Abnormal   Collection Time: 07/27/18  9:20 PM  Result Value Ref Range Status   Specimen Description   Final    URINE, CATHETERIZED Performed at [facility], [address], [place], [place] [address]    Special Requests   Final    NONE Performed at [facility], [address], [place], [place] [address]    Culture (A)  Final    >=100,000 COLONIES/mL PROTEUS MIRABILIS >=100,000 COLONIES/mL ESCHERICHIA COLI    Report Status 07/30/2018 FINAL  Final   Organism ID, Bacteria PROTEUS MIRABILIS (A)  Final   Organism ID, Bacteria ESCHERICHIA COLI (A)  Final      Susceptibility   Escherichia coli - MIC*  AMPICILLIN >=32 RESISTANT Resistant     CEFAZOLIN 16 SENSITIVE Sensitive     CEFTRIAXONE <=1 SENSITIVE Sensitive     CIPROFLOXACIN >=4 RESISTANT Resistant     GENTAMICIN <=1 SENSITIVE Sensitive     IMIPENEM <=0.25 SENSITIVE Sensitive     NITROFURANTOIN <=16 SENSITIVE Sensitive     TRIMETH/SULFA >=320 RESISTANT Resistant     AMPICILLIN/SULBACTAM >=32 RESISTANT Resistant     PIP/TAZO >=128 RESISTANT Resistant     * >=100,000 COLONIES/mL ESCHERICHIA COLI   Proteus mirabilis - MIC*    AMPICILLIN <=2 SENSITIVE Sensitive     CEFAZOLIN >=64 RESISTANT Resistant     CEFTRIAXONE <=1 SENSITIVE Sensitive     CIPROFLOXACIN <=0.25 SENSITIVE Sensitive     GENTAMICIN <=1 SENSITIVE Sensitive     IMIPENEM 2 SENSITIVE Sensitive     NITROFURANTOIN 128  RESISTANT Resistant     TRIMETH/SULFA <=20 SENSITIVE Sensitive     AMPICILLIN/SULBACTAM <=2 SENSITIVE Sensitive     PIP/TAZO <=4 SENSITIVE Sensitive     * >=100,000 COLONIES/mL PROTEUS MIRABILIS  C difficile quick scan w PCR reflex     Status: None   Collection Time: 07/27/18 10:13 PM  Result Value Ref Range Status   C Diff antigen NEGATIVE NEGATIVE Final   C Diff toxin NEGATIVE NEGATIVE Final   C Diff interpretation No C. difficile detected.  Final    Comment: Performed at [facility], [address], [place], [place] [address]  Gastrointestinal Panel by PCR , Stool     Status: None   Collection Time: 07/27/18 10:13 PM  Result Value Ref Range Status   Campylobacter species NOT DETECTED NOT DETECTED Final   Plesimonas shigelloides NOT DETECTED NOT DETECTED Final   Salmonella species NOT DETECTED NOT DETECTED Final   Yersinia enterocolitica NOT DETECTED NOT DETECTED Final   Vibrio species NOT DETECTED NOT DETECTED Final   Vibrio cholerae NOT DETECTED NOT DETECTED Final   Enteroaggregative E coli (EAEC) NOT DETECTED NOT DETECTED Final   Enteropathogenic E coli (EPEC) NOT DETECTED NOT DETECTED Final   Enterotoxigenic E coli (ETEC) NOT DETECTED NOT DETECTED Final   Shiga like toxin producing E coli (STEC) NOT DETECTED NOT DETECTED Final   Shigella/Enteroinvasive E coli (EIEC) NOT DETECTED NOT DETECTED Final   Cryptosporidium NOT DETECTED NOT DETECTED Final   Cyclospora cayetanensis NOT DETECTED NOT DETECTED Final   Entamoeba histolytica NOT DETECTED NOT DETECTED Final   Giardia lamblia NOT DETECTED NOT DETECTED Final   Adenovirus F40/41 NOT DETECTED NOT DETECTED Final   Astrovirus NOT DETECTED NOT DETECTED Final   Norovirus GI/GII NOT DETECTED NOT DETECTED Final   Rotavirus A NOT DETECTED NOT DETECTED Final   Sapovirus (I, II, IV, and V) NOT DETECTED NOT DETECTED Final    Comment: Performed at [facility], [address] [place], [place] [address]  SARS Coronavirus 2  (CEPHEID- Performed in [place] hospital lab), Hosp Order     Status: None   Collection Time: 07/27/18 11:20 PM  Result Value Ref Range Status   SARS Coronavirus 2 NEGATIVE NEGATIVE Final    Comment: (NOTE) If result is NEGATIVE SARS-CoV-2 target nucleic acids are NOT DETECTED. The SARS-CoV-2 RNA is generally detectable in upper and lower  respiratory specimens during the acute phase of infection. The lowest  concentration of SARS-CoV-2 viral copies this assay can detect is 250  copies / mL. A negative result does not preclude SARS-CoV-2 infection  and should not be used as the sole basis for treatment  or other  patient management decisions.  A negative result may occur with  improper specimen collection / handling, submission of specimen other  than nasopharyngeal swab, presence of viral mutation(s) within the  areas targeted by this assay, and inadequate number of viral copies  (<250 copies / mL). A negative result must be combined with clinical  observations, patient history, and epidemiological information. If result is POSITIVE SARS-CoV-2 target nucleic acids are DETECTED. The SARS-CoV-2 RNA is generally detectable in upper and lower  respiratory specimens dur ing the acute phase of infection.  Positive  results are indicative of active infection with SARS-CoV-2.  Clinical  correlation with patient history and other diagnostic information is  necessary to determine patient infection status.  Positive results do  not rule out bacterial infection or co-infection with other viruses. If result is PRESUMPTIVE POSTIVE SARS-CoV-2 nucleic acids MAY BE PRESENT.   A presumptive positive result was obtained on the submitted specimen  and confirmed on repeat testing.  While 2019 novel coronavirus  (SARS-CoV-2) nucleic acids may be present in the submitted sample  additional confirmatory testing may be necessary for epidemiological  and / or clinical management purposes  to differentiate  between  SARS-CoV-2 and other Sarbecovirus currently known to infect humans.  If clinically indicated additional testing with an alternate test  methodology [phone]) is advised. The SARS-CoV-2 RNA is generally  detectable in upper and lower respiratory sp ecimens during the acute  phase of infection. The expected result is Negative. Fact Sheet for Patients:  StrictlyIdeas.no Fact Sheet for Healthcare Providers: BankingDealers.co.za This test is not yet approved or cleared by the [place] FDA and has been authorized for detection and/or diagnosis of SARS-CoV-2 by FDA under an Emergency Use Authorization (EUA).  This EUA will remain in effect (meaning this test can be used) for the duration of the COVID-19 declaration under Section 564(b)(1) of the Act, 21 U.S.C. section 360bbb-3(b)(1), unless the authorization is terminated or revoked sooner. Performed at [facility], [address] [place], [place] [address]   Blood Culture (routine x 2)     Status: None (Preliminary result)   Collection Time: 07/27/18 11:26 PM  Result Value Ref Range Status   Specimen Description BLOOD LEFT ANTECUBITAL  Final   Special Requests   Final    BOTTLES DRAWN AEROBIC AND ANAEROBIC Blood Culture adequate volume   Culture   Final    NO GROWTH 4 DAYS Performed at [facility], [address], [place], [place] [address]    Report Status PENDING  Incomplete  Blood Culture (routine x 2)     Status: None (Preliminary result)   Collection Time: 07/27/18 11:28 PM  Result Value Ref Range Status   Specimen Description BLOOD RIGHT ANTECUBITAL  Final   Special Requests   Final    BOTTLES DRAWN AEROBIC AND ANAEROBIC Blood Culture adequate volume   Culture   Final    NO GROWTH 4 DAYS Performed at [facility], [address] [place], [place] [address]    Report Status PENDING  Incomplete  MRSA PCR Screening     Status: None   Collection Time: 07/28/18  2:13 AM   Result Value Ref Range Status   MRSA by PCR NEGATIVE NEGATIVE Final    Comment:        The GeneXpert MRSA Assay (FDA approved for NASAL specimens only), is one component of a comprehensive MRSA colonization surveillance program. It is not intended to diagnose MRSA infection nor to guide or  monitor treatment for MRSA infections. Performed at [facility], [address] [place], [place] [address]    Studies/Results: No results found.  Medications:  Prior to Admission:  Medications Prior to Admission  Medication Sig Dispense Refill Last Dose  . acetaminophen (TYLENOL) 500 MG tablet Take 1,000 mg by mouth daily as needed for moderate pain or headache.    07/27/2018 at Unknown time  . albuterol (PROAIR HFA) 108 (90 BASE) MCG/ACT inhaler Inhale 2 puffs into the lungs every 6 (six) hours as needed for wheezing or shortness of breath.    unknown  . allopurinol (ZYLOPRIM) 300 MG tablet Take 300 mg by mouth daily.     07/27/2018 at Unknown time  . alum & mag hydroxide-simeth (MAALOX/MYLANTA) 200-200-20 MG/5ML suspension Take 15 mLs by mouth every 6 (six) hours as needed for indigestion or heartburn.   unknown  . atorvastatin (LIPITOR) 20 MG tablet Take 20 mg by mouth at bedtime.   07/27/2018 at Unknown time  . cholecalciferol (VITAMIN D) 1000 units tablet Take 1,000 Units by mouth daily.   unknown  . fluticasone (FLONASE) 50 MCG/ACT nasal spray Place 2 sprays into both nostrils daily.    07/27/2018 at Unknown time  . furosemide (LASIX) 40 MG tablet Take 40 mg by mouth every morning. & 20 mg in the evening   07/27/2018 at Unknown time  . gabapentin (NEURONTIN) 400 MG capsule Take 400 mg by mouth daily.    07/27/2018 at Unknown time  . hydrALAZINE (APRESOLINE) 100 MG tablet Take 1 tablet (100 mg total) by mouth 3 (three) times daily. 90 tablet 3 07/27/2018 at Unknown time  . HYDROcodone-acetaminophen (NORCO/VICODIN) 5-325 MG tablet Take 1 tablet by mouth every 6 (six) hours as needed.   07/27/2018 at  Unknown time  . insulin lispro (HUMALOG KWIKPEN) 100 UNIT/ML KiwkPen Inject 18-20 Units into the skin as directed. Inject 2 to 18 units based on sliding scale as directed as needed for blood glucose over 200   07/27/2018 at Unknown time  . isosorbide mononitrate (IMDUR) 30 MG 24 hr tablet Take 1 tablet (30 mg total) by mouth daily. 90 tablet 0 07/27/2018 at Unknown time  . LANTUS SOLOSTAR 100 UNIT/ML Solostar Pen Inject 20 Units into the skin daily.    07/27/2018 at Unknown time  . linaclotide (LINZESS) 145 MCG CAPS capsule Take 1 capsule (145 mcg total) by mouth daily before breakfast. 30 capsule 5 07/27/2018 at Unknown time  . loratadine (CLARITIN) 10 MG tablet Take 10 mg by mouth daily.   07/27/2018 at Unknown time  . metoCLOPramide (REGLAN) 5 MG tablet Take 5 mg by mouth 3 (three) times daily before meals.   07/27/2018 at Unknown time  . metoprolol tartrate (LOPRESSOR) 100 MG tablet Take 100 mg by mouth 2 (two) times daily.   07/27/2018 at Unknown time  . Multiple Vitamin (MULTIVITAMIN WITH MINERALS) TABS tablet Take 1 tablet by mouth daily.   07/27/2018 at Unknown time  . ondansetron (ZOFRAN) 4 MG tablet Take 1 tablet (4 mg total) by mouth every 8 (eight) hours as needed for nausea or vomiting. 30 tablet 1 07/27/2018 at Unknown time  . pantoprazole (PROTONIX) 40 MG tablet Take 1 tablet (40 mg total) by mouth daily.   07/27/2018 at Unknown time  . potassium chloride SA (K-DUR) 20 MEQ tablet Take 1 tablet (20 mEq total) by mouth 2 (two) times daily. 60 tablet 5 07/27/2018 at Unknown time  . tamsulosin (FLOMAX) 0.4 MG CAPS capsule Take 1 capsule  by mouth daily.   07/27/2018 at Unknown time  . traZODone (DESYREL) 50 MG tablet Take 0.5 tablets (25 mg total) by mouth at bedtime as needed for sleep. 30 tablet 5 07/27/2018 at Unknown time  . vitamin C (ASCORBIC ACID) 500 MG tablet Take 500 mg by mouth 2 (two) times daily.   unknown   Scheduled: . allopurinol  100 mg Oral Daily  . atorvastatin  20 mg Oral QHS  .  cholecalciferol  1,000 Units Oral Daily  . fluticasone  2 spray Each Nare Daily  . gabapentin  400 mg Oral Daily  . insulin aspart  0-15 Units Subcutaneous TID WC  . insulin aspart  0-5 Units Subcutaneous QHS  . insulin aspart  4 Units Subcutaneous TID WC  . insulin glargine  20 Units Subcutaneous BID  . isosorbide mononitrate  30 mg Oral Daily  . loratadine  10 mg Oral Daily  . metoCLOPramide  5 mg Oral TID AC  . metoprolol tartrate  100 mg Oral BID  . pantoprazole  40 mg Oral Daily  . tamsulosin  0.4 mg Oral Daily   Continuous: . cefTRIAXone (ROCEPHIN)  IV 1 g (07/30/18 2214)   FFM:[id number] **OR** acetaminophen, albuterol, HYDROcodone-acetaminophen, ondansetron **OR** ondansetron (ZOFRAN) IV, traZODone  Assesment: He has a UTI with 2 organisms.  His previous antibiotic did not cover.  He is now on ceftriaxone which does cover.  Because of his indwelling Foley catheter I like to see him get about 3 days of IV antibiotics before he is discharged.  He has COPD which is stable  He has chronic systolic heart failure which is stable  He has an ICD which has not fired  He has diabetes and his blood sugar is still up to have modified his medicines Principal Problem:   Lower urinary tract infectious disease Active Problems:   COPD (chronic obstructive pulmonary disease) (HCC)   SVT (supraventricular tachycardia) (HCC)   Debility   Acute renal failure superimposed on stage 3 chronic kidney disease (HCC)   Chronic systolic CHF (congestive heart failure) ([place])   ICD (implantable cardioverter-defibrillator) in place    Plan: Continue current treatments    LOS: 3 days   Alonza Bogus 07/31/2018, 9:13 AM

## 2018-07-31 NOTE — TOC Progression Note (Addendum)
Transition of Care [facility]) - Progression Note    Patient Details  Name: DEWITTE VANNICE MRN: [id number] Date of Birth: [birth year]  Transition of Care [facility]) CM/SW Contact  Boneta Lucks, RN Phone Number: 07/31/2018, 11:10 AM  Clinical Narrative:   Spoke with patient, Patient has a high risk readmission score. Per Dr Luan Pulling he will be here a couple more days. Patient has follow up appointment and supportive family.  His wife and nephew Legrand Como drives him to his appointments. Patient is active with [facility] for RN,PT, OT,  Pt will need nurse aide added to order.     Expected Discharge Plan: [place] Barriers to Discharge: No Barriers Identified  Expected Discharge Plan and Services Expected Discharge Plan: [place] In-house Referral: Clinical Social Work Discharge Planning Services: CM Consult Post Acute Care Choice: [place] arrangements for the past 2 months: Single Family Home                           HH Arranged: RN, PT, OT, Nurse's Aide [place] Agency: [place]         Social Determinants of Health (SDOH) Interventions    Readmission Risk Interventions Readmission Risk Prevention Plan 07/31/2018 06/29/2018  Transportation Screening Complete Complete  PCP or Specialist Appt within 3-5 Days Complete Complete  HRI or Home Care Consult Complete Complete  Social Work Consult for [place] Planning/Counseling - Complete  Palliative Care Screening Not Complete Not Applicable  Medication Review Press photographer) Complete Complete  Some recent data might be hidden

## 2018-08-01 LAB — CULTURE, BLOOD (ROUTINE X 2)
Culture: NO GROWTH
Culture: NO GROWTH
Special Requests: ADEQUATE
Special Requests: ADEQUATE

## 2018-08-01 LAB — GLUCOSE, CAPILLARY
Glucose-Capillary: 130 mg/dL — ABNORMAL HIGH (ref 70–99)
Glucose-Capillary: 163 mg/dL — ABNORMAL HIGH (ref 70–99)
Glucose-Capillary: 175 mg/dL — ABNORMAL HIGH (ref 70–99)
Glucose-Capillary: 199 mg/dL — ABNORMAL HIGH (ref 70–99)

## 2018-08-01 MED ORDER — LINACLOTIDE 145 MCG PO CAPS
290.0000 ug | ORAL_CAPSULE | Freq: Every day | ORAL | Status: DC
  Administered 2018-08-01 – 2018-08-02 (×2): 290 ug via ORAL
  Filled 2018-08-01 (×2): qty 2

## 2018-08-01 NOTE — Progress Notes (Signed)
Subjective: He says he feels okay.  No new complaints.  His breathing is doing okay.  Objective: Vital signs in last 24 hours: Temp:  [98.3 F (36.8 C)-98.6 F (37 C)] 98.4 F (36.9 C) (05/30 0513) Pulse Rate:  [59-69] 63 (05/30 0513) Resp:  [16-20] 20 (05/30 0513) BP: (140-174)/(82-95) 174/95 (05/30 0513) SpO2:  [95 %-100 %] 98 % (05/30 0858) Weight change:  Last BM Date: 07/31/18  Intake/Output from previous day: 05/29 0701 - 05/30 0700 In: 1040 [P.O.:840; IV Piggyback:200] Out: 950 [Urine:950]  PHYSICAL EXAM General appearance: alert, cooperative and no distress Resp: clear to auscultation bilaterally Cardio: regular rate and rhythm, S1, S2 normal, no murmur, click, rub or gallop GI: soft, non-tender; bowel sounds normal; no masses,  no organomegaly Extremities: extremities normal, atraumatic, no cyanosis or edema  Lab Results:  Results for orders placed or performed during the hospital encounter of 07/27/18 (from the past 48 hour(s))  Glucose, capillary     Status: Abnormal   Collection Time: 07/30/18 11:23 AM  Result Value Ref Range   Glucose-Capillary 379 (H) 70 - 99 mg/dL  Glucose, capillary     Status: Abnormal   Collection Time: 07/30/18  4:12 PM  Result Value Ref Range   Glucose-Capillary 186 (H) 70 - 99 mg/dL  Glucose, capillary     Status: Abnormal   Collection Time: 07/30/18  9:47 PM  Result Value Ref Range   Glucose-Capillary 230 (H) 70 - 99 mg/dL  Glucose, capillary     Status: Abnormal   Collection Time: 07/31/18  7:32 AM  Result Value Ref Range   Glucose-Capillary 253 (H) 70 - 99 mg/dL  Hemoglobin A1c     Status: Abnormal   Collection Time: 07/31/18  9:12 AM  Result Value Ref Range   Hgb A1c MFr Bld 7.2 (H) 4.8 - 5.6 %    Comment: (NOTE) Pre diabetes:          5.7%-6.4% Diabetes:              >6.4% Glycemic control for   <7.0% adults with diabetes    Mean Plasma Glucose 159.94 mg/dL    Comment: Performed at [facility], [address], [place], [place] [address]  Glucose, capillary     Status: Abnormal   Collection Time: 07/31/18 11:05 AM  Result Value Ref Range   Glucose-Capillary 311 (H) 70 - 99 mg/dL  Glucose, capillary     Status: Abnormal   Collection Time: 07/31/18  4:36 PM  Result Value Ref Range   Glucose-Capillary 239 (H) 70 - 99 mg/dL  Glucose, capillary     Status: None   Collection Time: 07/31/18  9:57 PM  Result Value Ref Range   Glucose-Capillary 91 70 - 99 mg/dL  Glucose, capillary     Status: Abnormal   Collection Time: 08/01/18  7:32 AM  Result Value Ref Range   Glucose-Capillary 130 (H) 70 - 99 mg/dL    ABGS No results for input(s): PHART, PO2ART, TCO2, HCO3 in the last 72 hours.  Invalid input(s): PCO2 CULTURES Recent Results (from the past 240 hour(s))  Urine culture     Status: Abnormal   Collection Time: 07/27/18  9:20 PM  Result Value Ref Range Status   Specimen Description   Final    URINE, CATHETERIZED Performed at [facility], [address], [place], [place] [address]    Special Requests   Final    NONE Performed at [facility], [place]  [address] [place], [place] [address]    Culture (A)  Final    >=100,000 COLONIES/mL PROTEUS MIRABILIS >=100,000 COLONIES/mL ESCHERICHIA COLI    Report Status 07/30/2018 FINAL  Final   Organism ID, Bacteria PROTEUS MIRABILIS (A)  Final   Organism ID, Bacteria ESCHERICHIA COLI (A)  Final      Susceptibility   Escherichia coli - MIC*    AMPICILLIN >=32 RESISTANT Resistant     CEFAZOLIN 16 SENSITIVE Sensitive     CEFTRIAXONE <=1 SENSITIVE Sensitive     CIPROFLOXACIN >=4 RESISTANT Resistant     GENTAMICIN <=1 SENSITIVE Sensitive     IMIPENEM <=0.25 SENSITIVE Sensitive     NITROFURANTOIN <=16 SENSITIVE Sensitive     TRIMETH/SULFA >=320 RESISTANT Resistant     AMPICILLIN/SULBACTAM >=32 RESISTANT Resistant     PIP/TAZO >=128 RESISTANT Resistant     * >=100,000 COLONIES/mL ESCHERICHIA COLI   Proteus mirabilis - MIC*    AMPICILLIN <=2  SENSITIVE Sensitive     CEFAZOLIN >=64 RESISTANT Resistant     CEFTRIAXONE <=1 SENSITIVE Sensitive     CIPROFLOXACIN <=0.25 SENSITIVE Sensitive     GENTAMICIN <=1 SENSITIVE Sensitive     IMIPENEM 2 SENSITIVE Sensitive     NITROFURANTOIN 128 RESISTANT Resistant     TRIMETH/SULFA <=20 SENSITIVE Sensitive     AMPICILLIN/SULBACTAM <=2 SENSITIVE Sensitive     PIP/TAZO <=4 SENSITIVE Sensitive     * >=100,000 COLONIES/mL PROTEUS MIRABILIS  C difficile quick scan w PCR reflex     Status: None   Collection Time: 07/27/18 10:13 PM  Result Value Ref Range Status   C Diff antigen NEGATIVE NEGATIVE Final   C Diff toxin NEGATIVE NEGATIVE Final   C Diff interpretation No C. difficile detected.  Final    Comment: Performed at [facility], [address], [place], [place] [address]  Gastrointestinal Panel by PCR , Stool     Status: None   Collection Time: 07/27/18 10:13 PM  Result Value Ref Range Status   Campylobacter species NOT DETECTED NOT DETECTED Final   Plesimonas shigelloides NOT DETECTED NOT DETECTED Final   Salmonella species NOT DETECTED NOT DETECTED Final   Yersinia enterocolitica NOT DETECTED NOT DETECTED Final   Vibrio species NOT DETECTED NOT DETECTED Final   Vibrio cholerae NOT DETECTED NOT DETECTED Final   Enteroaggregative E coli (EAEC) NOT DETECTED NOT DETECTED Final   Enteropathogenic E coli (EPEC) NOT DETECTED NOT DETECTED Final   Enterotoxigenic E coli (ETEC) NOT DETECTED NOT DETECTED Final   Shiga like toxin producing E coli (STEC) NOT DETECTED NOT DETECTED Final   Shigella/Enteroinvasive E coli (EIEC) NOT DETECTED NOT DETECTED Final   Cryptosporidium NOT DETECTED NOT DETECTED Final   Cyclospora cayetanensis NOT DETECTED NOT DETECTED Final   Entamoeba histolytica NOT DETECTED NOT DETECTED Final   Giardia lamblia NOT DETECTED NOT DETECTED Final   Adenovirus F40/41 NOT DETECTED NOT DETECTED Final   Astrovirus NOT DETECTED NOT DETECTED Final   Norovirus GI/GII NOT DETECTED  NOT DETECTED Final   Rotavirus A NOT DETECTED NOT DETECTED Final   Sapovirus (I, II, IV, and V) NOT DETECTED NOT DETECTED Final    Comment: Performed at [facility], [address] [place], [place] [address]  SARS Coronavirus 2 (CEPHEID- Performed in [place] hospital lab), Hosp Order     Status: None   Collection Time: 07/27/18 11:20 PM  Result Value Ref Range Status   SARS Coronavirus 2 NEGATIVE NEGATIVE Final    Comment: (NOTE) If result  is NEGATIVE SARS-CoV-2 target nucleic acids are NOT DETECTED. The SARS-CoV-2 RNA is generally detectable in upper and lower  respiratory specimens during the acute phase of infection. The lowest  concentration of SARS-CoV-2 viral copies this assay can detect is 250  copies / mL. A negative result does not preclude SARS-CoV-2 infection  and should not be used as the sole basis for treatment or other  patient management decisions.  A negative result may occur with  improper specimen collection / handling, submission of specimen other  than nasopharyngeal swab, presence of viral mutation(s) within the  areas targeted by this assay, and inadequate number of viral copies  (<250 copies / mL). A negative result must be combined with clinical  observations, patient history, and epidemiological information. If result is POSITIVE SARS-CoV-2 target nucleic acids are DETECTED. The SARS-CoV-2 RNA is generally detectable in upper and lower  respiratory specimens dur ing the acute phase of infection.  Positive  results are indicative of active infection with SARS-CoV-2.  Clinical  correlation with patient history and other diagnostic information is  necessary to determine patient infection status.  Positive results do  not rule out bacterial infection or co-infection with other viruses. If result is PRESUMPTIVE POSTIVE SARS-CoV-2 nucleic acids MAY BE PRESENT.   A presumptive positive result was obtained on the submitted specimen  and confirmed on  repeat testing.  While 2019 novel coronavirus  (SARS-CoV-2) nucleic acids may be present in the submitted sample  additional confirmatory testing may be necessary for epidemiological  and / or clinical management purposes  to differentiate between  SARS-CoV-2 and other Sarbecovirus currently known to infect humans.  If clinically indicated additional testing with an alternate test  methodology [phone]) is advised. The SARS-CoV-2 RNA is generally  detectable in upper and lower respiratory sp ecimens during the acute  phase of infection. The expected result is Negative. Fact Sheet for Patients:  StrictlyIdeas.no Fact Sheet for Healthcare Providers: BankingDealers.co.za This test is not yet approved or cleared by the [place] FDA and has been authorized for detection and/or diagnosis of SARS-CoV-2 by FDA under an Emergency Use Authorization (EUA).  This EUA will remain in effect (meaning this test can be used) for the duration of the COVID-19 declaration under Section 564(b)(1) of the Act, 21 U.S.C. section 360bbb-3(b)(1), unless the authorization is terminated or revoked sooner. Performed at [facility], [address] [place], [place] [address]   Blood Culture (routine x 2)     Status: None   Collection Time: 07/27/18 11:26 PM  Result Value Ref Range Status   Specimen Description BLOOD LEFT ANTECUBITAL  Final   Special Requests   Final    BOTTLES DRAWN AEROBIC AND ANAEROBIC Blood Culture adequate volume   Culture   Final    NO GROWTH 5 DAYS Performed at [facility], [address], [place], [place] [address]    Report Status 08/01/2018 FINAL  Final  Blood Culture (routine x 2)     Status: None   Collection Time: 07/27/18 11:28 PM  Result Value Ref Range Status   Specimen Description BLOOD RIGHT ANTECUBITAL  Final   Special Requests   Final    BOTTLES DRAWN AEROBIC AND ANAEROBIC Blood Culture adequate volume   Culture   Final     NO GROWTH 5 DAYS Performed at [facility], [address] [place], [place] [address]    Report Status 08/01/2018 FINAL  Final  MRSA PCR Screening     Status: None  Collection Time: 07/28/18  2:13 AM  Result Value Ref Range Status   MRSA by PCR NEGATIVE NEGATIVE Final    Comment:        The GeneXpert MRSA Assay (FDA approved for NASAL specimens only), is one component of a comprehensive MRSA colonization surveillance program. It is not intended to diagnose MRSA infection nor to guide or monitor treatment for MRSA infections. Performed at [facility], [address] [place],  [address]    Studies/Results: No results found.  Medications:  Prior to Admission:  Medications Prior to Admission  Medication Sig Dispense Refill Last Dose  . acetaminophen (TYLENOL) 500 MG tablet Take 1,000 mg by mouth daily as needed for moderate pain or headache.    07/27/2018 at Unknown time  . albuterol (PROAIR HFA) 108 (90 BASE) MCG/ACT inhaler Inhale 2 puffs into the lungs every 6 (six) hours as needed for wheezing or shortness of breath.    unknown  . allopurinol (ZYLOPRIM) 300 MG tablet Take 300 mg by mouth daily.     07/27/2018 at Unknown time  . alum & mag hydroxide-simeth (MAALOX/MYLANTA) 200-200-20 MG/5ML suspension Take 15 mLs by mouth every 6 (six) hours as needed for indigestion or heartburn.   unknown  . atorvastatin (LIPITOR) 20 MG tablet Take 20 mg by mouth at bedtime.   07/27/2018 at Unknown time  . cholecalciferol (VITAMIN D) 1000 units tablet Take 1,000 Units by mouth daily.   unknown  . fluticasone (FLONASE) 50 MCG/ACT nasal spray Place 2 sprays into both nostrils daily.    07/27/2018 at Unknown time  . furosemide (LASIX) 40 MG tablet Take 40 mg by mouth every morning. & 20 mg in the evening   07/27/2018 at Unknown time  . gabapentin (NEURONTIN) 400 MG capsule Take 400 mg by mouth daily.    07/27/2018 at Unknown time  . hydrALAZINE (APRESOLINE) 100 MG tablet Take 1 tablet (100  mg total) by mouth 3 (three) times daily. 90 tablet 3 07/27/2018 at Unknown time  . HYDROcodone-acetaminophen (NORCO/VICODIN) 5-325 MG tablet Take 1 tablet by mouth every 6 (six) hours as needed.   07/27/2018 at Unknown time  . insulin lispro (HUMALOG KWIKPEN) 100 UNIT/ML KiwkPen Inject 18-20 Units into the skin as directed. Inject 2 to 18 units based on sliding scale as directed as needed for blood glucose over 200   07/27/2018 at Unknown time  . LANTUS SOLOSTAR 100 UNIT/ML Solostar Pen Inject 20 Units into the skin daily.    07/27/2018 at Unknown time  . linaclotide (LINZESS) 145 MCG CAPS capsule Take 1 capsule (145 mcg total) by mouth daily before breakfast. 30 capsule 5 07/27/2018 at Unknown time  . loratadine (CLARITIN) 10 MG tablet Take 10 mg by mouth daily.   07/27/2018 at Unknown time  . metoCLOPramide (REGLAN) 5 MG tablet Take 5 mg by mouth 3 (three) times daily before meals.   07/27/2018 at Unknown time  . metoprolol tartrate (LOPRESSOR) 100 MG tablet Take 100 mg by mouth 2 (two) times daily.   07/27/2018 at Unknown time  . Multiple Vitamin (MULTIVITAMIN WITH MINERALS) TABS tablet Take 1 tablet by mouth daily.   07/27/2018 at Unknown time  . ondansetron (ZOFRAN) 4 MG tablet Take 1 tablet (4 mg total) by mouth every 8 (eight) hours as needed for nausea or vomiting. 30 tablet 1 07/27/2018 at Unknown time  . pantoprazole (PROTONIX) 40 MG tablet Take 1 tablet (40 mg total) by mouth daily.   07/27/2018 at Unknown time  .  potassium chloride SA (K-DUR) 20 MEQ tablet Take 1 tablet (20 mEq total) by mouth 2 (two) times daily. 60 tablet 5 07/27/2018 at Unknown time  . tamsulosin (FLOMAX) 0.4 MG CAPS capsule Take 1 capsule by mouth daily.   07/27/2018 at Unknown time  . traZODone (DESYREL) 50 MG tablet Take 0.5 tablets (25 mg total) by mouth at bedtime as needed for sleep. 30 tablet 5 07/27/2018 at Unknown time  . vitamin C (ASCORBIC ACID) 500 MG tablet Take 500 mg by mouth 2 (two) times daily.   unknown    Scheduled: . allopurinol  100 mg Oral Daily  . atorvastatin  20 mg Oral QHS  . cholecalciferol  1,000 Units Oral Daily  . fluticasone  2 spray Each Nare Daily  . gabapentin  400 mg Oral Daily  . insulin aspart  0-15 Units Subcutaneous TID WC  . insulin aspart  0-5 Units Subcutaneous QHS  . insulin aspart  4 Units Subcutaneous TID WC  . insulin glargine  20 Units Subcutaneous BID  . isosorbide mononitrate  30 mg Oral Daily  . linaclotide  290 mcg Oral QAC breakfast  . loratadine  10 mg Oral Daily  . metoCLOPramide  5 mg Oral TID AC  . metoprolol tartrate  100 mg Oral BID  . pantoprazole  40 mg Oral Daily  . tamsulosin  0.4 mg Oral Daily   Continuous: . cefTRIAXone (ROCEPHIN)  IV 1 g (07/31/18 2046)   [id number] **OR** acetaminophen, albuterol, HYDROcodone-acetaminophen, ondansetron **OR** ondansetron (ZOFRAN) IV, traZODone  Assesment: He was admitted with urinary tract infection and he is being treated for that.  He has 2 organisms.  He has had a Foley catheter for the last month or so because of urinary retention.  He was dehydrated on admission and that is better.  He had acute renal failure superimposed on stage III chronic kidney disease and that is improved  He has chronic systolic heart failure so his IV fluids have been discontinued  He has COPD at baseline which is stable  He has general debility and it has been recommended that he go to a rehab facility but he chooses to do home health with home health physical therapy and nursing. Principal Problem:   Lower urinary tract infectious disease Active Problems:   COPD (chronic obstructive pulmonary disease) (HCC)   SVT (supraventricular tachycardia) (HCC)   Debility   Acute renal failure superimposed on stage 3 chronic kidney disease (HCC)   Chronic systolic CHF (congestive heart failure) ([place])   ICD (implantable cardioverter-defibrillator) in place    Plan: Continue IV antibiotics.  Probable discharge in  the morning    LOS: 4 days   Alonza Bogus 08/01/2018, 9:23 AM

## 2018-08-02 LAB — CBC WITH DIFFERENTIAL/PLATELET
Abs Immature Granulocytes: 0.06 10*3/uL (ref 0.00–0.07)
Basophils Absolute: 0.1 10*3/uL (ref 0.0–0.1)
Basophils Relative: 1 %
Eosinophils Absolute: 0.2 10*3/uL (ref 0.0–0.5)
Eosinophils Relative: 2 %
HCT: 35.6 % — ABNORMAL LOW (ref 39.0–52.0)
Hemoglobin: 12.1 g/dL — ABNORMAL LOW (ref 13.0–17.0)
Immature Granulocytes: 1 %
Lymphocytes Relative: 35 %
Lymphs Abs: 3.1 10*3/uL (ref 0.7–4.0)
MCH: 31 pg (ref 26.0–34.0)
MCHC: 34 g/dL (ref 30.0–36.0)
MCV: 91.3 fL (ref 80.0–100.0)
Monocytes Absolute: 0.6 10*3/uL (ref 0.1–1.0)
Monocytes Relative: 7 %
Neutro Abs: 4.7 10*3/uL (ref 1.7–7.7)
Neutrophils Relative %: 54 %
Platelets: 232 10*3/uL (ref 150–400)
RBC: 3.9 MIL/uL — ABNORMAL LOW (ref 4.22–5.81)
RDW: 14.7 % (ref 11.5–15.5)
WBC: 8.6 10*3/uL (ref 4.0–10.5)
nRBC: 0 % (ref 0.0–0.2)

## 2018-08-02 LAB — COMPREHENSIVE METABOLIC PANEL
ALT: 30 U/L (ref 0–44)
AST: 35 U/L (ref 15–41)
Albumin: 3 g/dL — ABNORMAL LOW (ref 3.5–5.0)
Alkaline Phosphatase: 50 U/L (ref 38–126)
Anion gap: 10 (ref 5–15)
BUN: 22 mg/dL (ref 8–23)
CO2: 23 mmol/L (ref 22–32)
Calcium: 8.9 mg/dL (ref 8.9–10.3)
Chloride: 102 mmol/L (ref 98–111)
Creatinine, Ser: 1.31 mg/dL — ABNORMAL HIGH (ref 0.61–1.24)
GFR calc Af Amer: >60 mL/min (ref >60)
GFR calc non Af Amer: 56 mL/min — ABNORMAL LOW (ref >60)
Glucose, Bld: 144 mg/dL — ABNORMAL HIGH (ref 70–99)
Potassium: 3 mmol/L — ABNORMAL LOW (ref 3.5–5.1)
Sodium: 135 mmol/L (ref 135–145)
Total Bilirubin: 0.5 mg/dL (ref 0.3–1.2)
Total Protein: 6.3 g/dL — ABNORMAL LOW (ref 6.5–8.1)

## 2018-08-02 LAB — GLUCOSE, CAPILLARY: Glucose-Capillary: 142 mg/dL — ABNORMAL HIGH (ref 70–99)

## 2018-08-02 MED ORDER — CEFDINIR 300 MG PO CAPS: 300.0000 mg | ORAL_CAPSULE | Freq: Two times a day (BID) | ORAL | 1 refills | Status: DC

## 2018-08-02 NOTE — TOC Transition Note (Signed)
Transition of Care [facility]) - CM/SW Discharge Note   Patient Details  Name: Frank Hoover MRN: [id number] Date of Birth: Mar 20, 1950  [facility] CM/SW Contact:  Latanya Maudlin, RN Phone Number: 08/02/2018, 11:10 AM   Clinical Narrative:  Patient to be discharged per MD order. Orders in place for home health services. Previous RNCM had began workup for home health via [facility]. This plan remains in place. Notified Malachy Mood of pending discharge. No DME needs. Family to transport.      Final next level of care: [place] Barriers to Discharge: No Barriers Identified   Patient Goals and CMS Choice Patient states their goals for this hospitalization and ongoing recovery are:: to return home with Home Health CMS Medicare.gov Compare Post Acute Care list provided to:: Patient Choice offered to / list presented to : Patient  Discharge Placement PASRR number recieved: 07/29/18                     Discharge Plan and Services In-house Referral: Clinical Social Work Discharge Planning Services: CM Consult Post Acute Care Choice: Home Health                    HH Arranged: RN, PT, OT, Nurse's Aide [place] Agency: [place] Date [place]: 08/02/18 Time HH Agency Contacted: 1110 Representative spoke with at [place]: cheryl  Social Determinants of Health ([place]) Interventions     Readmission Risk Interventions Readmission Risk Prevention Plan 07/31/2018 06/29/2018  Transportation Screening Complete Complete  PCP or Specialist Appt within 3-5 Days Complete Complete  HRI or [place] Complete Complete  Social Work Consult for [place] Planning/Counseling - Complete  Palliative Care Screening Not Complete Not Applicable  Medication Review Press photographer) Complete Complete  Some recent data might be hidden

## 2018-08-02 NOTE — Discharge Summary (Signed)
Physician Discharge Summary  Patient ID: Frank Hoover MRN: [id number] DOB/AGE: Aug 28, 1950 68 y.o. Primary Care Physician:Boniface Goffe, Percell Miller, MD Admit date: 07/27/2018 Discharge date: 08/02/2018    Discharge Diagnoses:   Principal Problem:   Lower urinary tract infectious disease Active Problems:   COPD (chronic obstructive pulmonary disease) (HCC)   SVT (supraventricular tachycardia) (HCC)   Debility   Acute renal failure superimposed on stage 3 chronic kidney disease (HCC)   Chronic systolic CHF (congestive heart failure) ([place])   ICD (implantable cardioverter-defibrillator) in place   Pressure injury of skin   Allergies as of 08/02/2018   No Known Allergies     Medication List    TAKE these medications   acetaminophen 500 MG tablet Commonly known as:  TYLENOL Take 1,000 mg by mouth daily as needed for moderate pain or headache.   allopurinol 300 MG tablet Commonly known as:  ZYLOPRIM Take 300 mg by mouth daily.   alum & mag hydroxide-simeth 200-200-20 MG/5ML suspension Commonly known as:  MAALOX/MYLANTA Take 15 mLs by mouth every 6 (six) hours as needed for indigestion or heartburn.   atorvastatin 20 MG tablet Commonly known as:  LIPITOR Take 20 mg by mouth at bedtime.   cefdinir 300 MG capsule Commonly known as:  OMNICEF Take 1 capsule (300 mg total) by mouth 2 (two) times daily.   cholecalciferol 1000 units tablet Commonly known as:  VITAMIN D Take 1,000 Units by mouth daily.   fluticasone 50 MCG/ACT nasal spray Commonly known as:  FLONASE Place 2 sprays into both nostrils daily.   furosemide 40 MG tablet Commonly known as:  LASIX Take 40 mg by mouth every morning. & 20 mg in the evening   gabapentin 400 MG capsule Commonly known as:  NEURONTIN Take 400 mg by mouth daily.   HumaLOG KwikPen 100 UNIT/ML KiwkPen Generic drug:  insulin lispro Inject 18-20 Units into the skin as directed. Inject 2 to 18 units based on sliding scale as directed as  needed for blood glucose over 200   hydrALAZINE 100 MG tablet Commonly known as:  APRESOLINE Take 1 tablet (100 mg total) by mouth 3 (three) times daily.   HYDROcodone-acetaminophen 5-325 MG tablet Commonly known as:  NORCO/VICODIN Take 1 tablet by mouth every 6 (six) hours as needed.   isosorbide mononitrate 30 MG 24 hr tablet Commonly known as:  IMDUR TAKE 1 TABLET DAILY   Lantus SoloStar 100 UNIT/ML Solostar Pen Generic drug:  Insulin Glargine Inject 20 Units into the skin daily.   linaclotide 145 MCG Caps capsule Commonly known as:  LINZESS Take 1 capsule (145 mcg total) by mouth daily before breakfast.   loratadine 10 MG tablet Commonly known as:  CLARITIN Take 10 mg by mouth daily.   metoCLOPramide 5 MG tablet Commonly known as:  REGLAN Take 5 mg by mouth 3 (three) times daily before meals.   metoprolol tartrate 100 MG tablet Commonly known as:  LOPRESSOR Take 100 mg by mouth 2 (two) times daily.   multivitamin with minerals Tabs tablet Take 1 tablet by mouth daily.   ondansetron 4 MG tablet Commonly known as:  ZOFRAN Take 1 tablet (4 mg total) by mouth every 8 (eight) hours as needed for nausea or vomiting.   pantoprazole 40 MG tablet Commonly known as:  PROTONIX Take 1 tablet (40 mg total) by mouth daily.   potassium chloride SA 20 MEQ tablet Commonly known as:  K-DUR Take 1 tablet (20 mEq total) by mouth 2 (two) times daily.  ProAir HFA 108 (90 Base) MCG/ACT inhaler Generic drug:  albuterol Inhale 2 puffs into the lungs every 6 (six) hours as needed for wheezing or shortness of breath.   tamsulosin 0.4 MG Caps capsule Commonly known as:  FLOMAX Take 1 capsule by mouth daily.   traZODone 50 MG tablet Commonly known as:  DESYREL Take 0.5 tablets (25 mg total) by mouth at bedtime as needed for sleep.   vitamin C 500 MG tablet Commonly known as:  ASCORBIC ACID Take 500 mg by mouth 2 (two) times daily.       Discharged  Condition:improved    Consults:none  Significant Diagnostic Studies: Dg Abd Acute W/chest  Result Date: 07/28/2018 CLINICAL DATA:  Generalized weakness EXAM: DG ABDOMEN ACUTE W/ 1V CHEST COMPARISON:  06/28/2018 FINDINGS: Cardiac shadows within normal limits. 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 The lungs are clear bilaterally. Scattered large and small bowel gas is noted. No free air is seen. No obstructive changes are noted. Degenerative changes of lumbar spine are noted. IMPRESSION: No acute abnormality in the chest and abdomen. Electronically Signed   By: Inez Catalina M.D.   On: 07/28/2018 00:36    Lab Results: Basic Metabolic Panel: Recent Labs    08/02/18 0524  NA 135  K 3.0*  CL 102  CO2 23  GLUCOSE 144*  BUN 22  CREATININE 1.31*  CALCIUM 8.9   Liver Function Tests: Recent Labs    08/02/18 0524  AST 35  ALT 30  ALKPHOS 50  BILITOT 0.5  PROT 6.3*  ALBUMIN 3.0*     CBC: Recent Labs    08/02/18 0524  WBC 8.6  NEUTROABS 4.7  HGB 12.1*  HCT 35.6*  MCV 91.3  PLT 232    Recent Results (from the past 240 hour(s))  Urine culture     Status: Abnormal   Collection Time: 07/27/18  9:20 PM  Result Value Ref Range Status   Specimen Description   Final    URINE, CATHETERIZED Performed at [facility], [address] [place], [place] [address]    Special Requests   Final    NONE Performed at [facility], [address], [place], [place] [address]    Culture (A)  Final    >=100,000 COLONIES/mL PROTEUS MIRABILIS >=100,000 COLONIES/mL ESCHERICHIA COLI    Report Status 07/30/2018 FINAL  Final   Organism ID, Bacteria PROTEUS MIRABILIS (A)  Final   Organism ID, Bacteria ESCHERICHIA COLI (A)  Final      Susceptibility   Escherichia coli - MIC*    AMPICILLIN >=32 RESISTANT Resistant     CEFAZOLIN 16 SENSITIVE Sensitive     CEFTRIAXONE <=1 SENSITIVE Sensitive     CIPROFLOXACIN >=4 RESISTANT Resistant     GENTAMICIN <=1 SENSITIVE Sensitive     IMIPENEM  <=0.25 SENSITIVE Sensitive     NITROFURANTOIN <=16 SENSITIVE Sensitive     TRIMETH/SULFA >=320 RESISTANT Resistant     AMPICILLIN/SULBACTAM >=32 RESISTANT Resistant     PIP/TAZO >=128 RESISTANT Resistant     * >=100,000 COLONIES/mL ESCHERICHIA COLI   Proteus mirabilis - MIC*    AMPICILLIN <=2 SENSITIVE Sensitive     CEFAZOLIN >=64 RESISTANT Resistant     CEFTRIAXONE <=1 SENSITIVE Sensitive     CIPROFLOXACIN <=0.25 SENSITIVE Sensitive     GENTAMICIN <=1 SENSITIVE Sensitive     IMIPENEM 2 SENSITIVE Sensitive     NITROFURANTOIN 128 RESISTANT Resistant     TRIMETH/SULFA <=20 SENSITIVE Sensitive     AMPICILLIN/SULBACTAM <=2  SENSITIVE Sensitive     PIP/TAZO <=4 SENSITIVE Sensitive     * >=100,000 COLONIES/mL PROTEUS MIRABILIS  C difficile quick scan w PCR reflex     Status: None   Collection Time: 07/27/18 10:13 PM  Result Value Ref Range Status   C Diff antigen NEGATIVE NEGATIVE Final   C Diff toxin NEGATIVE NEGATIVE Final   C Diff interpretation No C. difficile detected.  Final    Comment: Performed at [facility], [address] [place], [place] [address]  Gastrointestinal Panel by PCR , Stool     Status: None   Collection Time: 07/27/18 10:13 PM  Result Value Ref Range Status   Campylobacter species NOT DETECTED NOT DETECTED Final   Plesimonas shigelloides NOT DETECTED NOT DETECTED Final   Salmonella species NOT DETECTED NOT DETECTED Final   Yersinia enterocolitica NOT DETECTED NOT DETECTED Final   Vibrio species NOT DETECTED NOT DETECTED Final   Vibrio cholerae NOT DETECTED NOT DETECTED Final   Enteroaggregative E coli (EAEC) NOT DETECTED NOT DETECTED Final   Enteropathogenic E coli (EPEC) NOT DETECTED NOT DETECTED Final   Enterotoxigenic E coli (ETEC) NOT DETECTED NOT DETECTED Final   Shiga like toxin producing E coli (STEC) NOT DETECTED NOT DETECTED Final   Shigella/Enteroinvasive E coli (EIEC) NOT DETECTED NOT DETECTED Final   Cryptosporidium NOT DETECTED NOT DETECTED Final    Cyclospora cayetanensis NOT DETECTED NOT DETECTED Final   Entamoeba histolytica NOT DETECTED NOT DETECTED Final   Giardia lamblia NOT DETECTED NOT DETECTED Final   Adenovirus F40/41 NOT DETECTED NOT DETECTED Final   Astrovirus NOT DETECTED NOT DETECTED Final   Norovirus GI/GII NOT DETECTED NOT DETECTED Final   Rotavirus A NOT DETECTED NOT DETECTED Final   Sapovirus (I, II, IV, and V) NOT DETECTED NOT DETECTED Final    Comment: Performed at [facility], [address] [place], [place] [address]  SARS Coronavirus 2 (CEPHEID- Performed in [facility] hospital lab), Hosp Order     Status: None   Collection Time: 07/27/18 11:20 PM  Result Value Ref Range Status   SARS Coronavirus 2 NEGATIVE NEGATIVE Final    Comment: (NOTE) If result is NEGATIVE SARS-CoV-2 target nucleic acids are NOT DETECTED. The SARS-CoV-2 RNA is generally detectable in upper and lower  respiratory specimens during the acute phase of infection. The lowest  concentration of SARS-CoV-2 viral copies this assay can detect is 250  copies / mL. A negative result does not preclude SARS-CoV-2 infection  and should not be used as the sole basis for treatment or other  patient management decisions.  A negative result may occur with  improper specimen collection / handling, submission of specimen other  than nasopharyngeal swab, presence of viral mutation(s) within the  areas targeted by this assay, and inadequate number of viral copies  (<250 copies / mL). A negative result must be combined with clinical  observations, patient history, and epidemiological information. If result is POSITIVE SARS-CoV-2 target nucleic acids are DETECTED. The SARS-CoV-2 RNA is generally detectable in upper and lower  respiratory specimens dur ing the acute phase of infection.  Positive  results are indicative of active infection with SARS-CoV-2.  Clinical  correlation with patient history and other diagnostic information is   necessary to determine patient infection status.  Positive results do  not rule out bacterial infection or co-infection with other viruses. If result is PRESUMPTIVE POSTIVE SARS-CoV-2 nucleic acids MAY BE PRESENT.   A presumptive positive result was obtained on  the submitted specimen  and confirmed on repeat testing.  While 2019 novel coronavirus  (SARS-CoV-2) nucleic acids may be present in the submitted sample  additional confirmatory testing may be necessary for epidemiological  and / or clinical management purposes  to differentiate between  SARS-CoV-2 and other Sarbecovirus currently known to infect humans.  If clinically indicated additional testing with an alternate test  methodology [phone]) is advised. The SARS-CoV-2 RNA is generally  detectable in upper and lower respiratory sp ecimens during the acute  phase of infection. The expected result is Negative. Fact Sheet for Patients:  StrictlyIdeas.no Fact Sheet for Healthcare Providers: BankingDealers.co.za This test is not yet approved or cleared by the [place] FDA and has been authorized for detection and/or diagnosis of SARS-CoV-2 by FDA under an Emergency Use Authorization (EUA).  This EUA will remain in effect (meaning this test can be used) for the duration of the COVID-19 declaration under Section 564(b)(1) of the Act, 21 U.S.C. section 360bbb-3(b)(1), unless the authorization is terminated or revoked sooner. Performed at [facility], [address], [place], [place] [address]   Blood Culture (routine x 2)     Status: None   Collection Time: 07/27/18 11:26 PM  Result Value Ref Range Status   Specimen Description BLOOD LEFT ANTECUBITAL  Final   Special Requests   Final    BOTTLES DRAWN AEROBIC AND ANAEROBIC Blood Culture adequate volume   Culture   Final    NO GROWTH 5 DAYS Performed at [facility], [address], [place], [place] [address]    Report Status  08/01/2018 FINAL  Final  Blood Culture (routine x 2)     Status: None   Collection Time: 07/27/18 11:28 PM  Result Value Ref Range Status   Specimen Description BLOOD RIGHT ANTECUBITAL  Final   Special Requests   Final    BOTTLES DRAWN AEROBIC AND ANAEROBIC Blood Culture adequate volume   Culture   Final    NO GROWTH 5 DAYS Performed at [facility], [address] [place], [place] [address]    Report Status 08/01/2018 FINAL  Final  MRSA PCR Screening     Status: None   Collection Time: 07/28/18  2:13 AM  Result Value Ref Range Status   MRSA by PCR NEGATIVE NEGATIVE Final    Comment:        The GeneXpert MRSA Assay (FDA approved for NASAL specimens only), is one component of a comprehensive MRSA colonization surveillance program. It is not intended to diagnose MRSA infection nor to guide or monitor treatment for MRSA infections. Performed at [facility], [address] [place], [place] [address]      Hospital Course: This is a 68 year old who came to the emergency department because of a urinary tract infection.  He has an indwelling Foley catheter.  He was constipated and then he could not urinate.  He had UTI and acute kidney injury.  He grew Proteus and E. coli in his urine.  He was treated with IV antibiotics.  He improved dramatically.  Discharge Exam: Blood pressure (!) 171/98, pulse (!) 58, temperature 98.4 F (36.9 C), temperature source Oral, resp. rate 17, height 5' 6"  (1.676 m), weight 107.1 kg, SpO2 99 %. He is awake and alert.  Chest is clear.  Heart is regular.  Disposition: Home with home health services.  He will go home with Foley catheter and be set up to see the urologist.  He will have 5 more days of Ceftin ear  Discharge Instructions    Face-to-face encounter (required for Medicare/Medicaid patients)   Complete by:  As directed    I Alonza Bogus certify that this patient is under my care and that I, or a nurse practitioner or physician's assistant  working with me, had a face-to-face encounter that meets the physician face-to-face encounter requirements with this patient on 08/02/2018. The encounter with the patient was in whole, or in part for the following medical condition(s) which is the primary reason for home health care (List medical condition): Urinary tract infection   The encounter with the patient was in whole, or in part, for the following medical condition, which is the primary reason for home health care:  Urinary tract infection   I certify that, based on my findings, the following services are medically necessary home health services:   Nursing Physical therapy     Reason for Medically Necessary Home Health Services:  Skilled Nursing- Change/Decline in Patient Status   My clinical findings support the need for the above services:  Unable to leave home safely without assistance and/or assistive device   Further, I certify that my clinical findings support that this patient is homebound due to:  Unable to leave home safely without assistance   Home Health   Complete by:  As directed    To provide the following care/treatments:   PT RN     Please check urine culture 24 hours after antibiotic finished      Follow-up Information    Sinda Du, MD Follow up on 08/10/2018.   Specialty:  Pulmonary Disease Why:  Mnday at 10:30 with the Dr. Minette Brine information: [address] [place] [address] [phone]        [facility] .   Contact information: [place] [address] [phone]        Care, [facility] Follow up.   Contact information: [place] [address] [phone]           Signed: Alonza Bogus   08/02/2018, 9:25 AM

## 2018-08-02 NOTE — Progress Notes (Signed)
Subjective: He says he feels well and wants to go home.  He has no complaints.  His breathing is doing okay.  No diarrhea.  Objective: Vital signs in last 24 hours: Temp:  [98.4 F (36.9 C)-98.9 F (37.2 C)] 98.4 F (36.9 C) (05/31 0518) Pulse Rate:  [58-68] 58 (05/31 0518) Resp:  [17-20] 17 (05/31 0518) BP: (145-171)/(82-98) 171/98 (05/31 0518) SpO2:  [98 %-100 %] 99 % (05/31 0518) Weight change:  Last BM Date: 08/01/18  Intake/Output from previous day: 05/30 0701 - 05/31 0700 In: 1080 [P.O.:1080] Out: 750 [Urine:750]  PHYSICAL EXAM General appearance: alert, cooperative and no distress Resp: clear to auscultation bilaterally Cardio: regular rate and rhythm, S1, S2 normal, no murmur, click, rub or gallop GI: soft, non-tender; bowel sounds normal; no masses,  no organomegaly Extremities: extremities normal, atraumatic, no cyanosis or edema  Lab Results:  Results for orders placed or performed during the hospital encounter of 07/27/18 (from the past 48 hour(s))  Glucose, capillary     Status: Abnormal   Collection Time: 07/31/18 11:05 AM  Result Value Ref Range   Glucose-Capillary 311 (H) 70 - 99 mg/dL  Glucose, capillary     Status: Abnormal   Collection Time: 07/31/18  4:36 PM  Result Value Ref Range   Glucose-Capillary 239 (H) 70 - 99 mg/dL  Glucose, capillary     Status: None   Collection Time: 07/31/18  9:57 PM  Result Value Ref Range   Glucose-Capillary 91 70 - 99 mg/dL  Glucose, capillary     Status: Abnormal   Collection Time: 08/01/18  7:32 AM  Result Value Ref Range   Glucose-Capillary 130 (H) 70 - 99 mg/dL  Glucose, capillary     Status: Abnormal   Collection Time: 08/01/18 11:18 AM  Result Value Ref Range   Glucose-Capillary 163 (H) 70 - 99 mg/dL  Glucose, capillary     Status: Abnormal   Collection Time: 08/01/18  4:13 PM  Result Value Ref Range   Glucose-Capillary 175 (H) 70 - 99 mg/dL   Comment 1 Notify RN    Comment 2 Document in Chart    Glucose, capillary     Status: Abnormal   Collection Time: 08/01/18  8:52 PM  Result Value Ref Range   Glucose-Capillary 199 (H) 70 - 99 mg/dL   Comment 1 Notify RN    Comment 2 Document in Chart   CBC with Differential/Platelet     Status: Abnormal   Collection Time: 08/02/18  5:24 AM  Result Value Ref Range   WBC 8.6 4.0 - 10.5 K/uL   RBC 3.90 (L) 4.22 - 5.81 MIL/uL   Hemoglobin 12.1 (L) 13.0 - 17.0 g/dL   HCT 35.6 (L) 39.0 - 52.0 %   MCV 91.3 80.0 - 100.0 fL   MCH 31.0 26.0 - 34.0 pg   MCHC 34.0 30.0 - 36.0 g/dL   RDW 14.7 11.5 - 15.5 %   Platelets 232 150 - 400 K/uL   nRBC 0.0 0.0 - 0.2 %   Neutrophils Relative % 54 %   Neutro Abs 4.7 1.7 - 7.7 K/uL   Lymphocytes Relative 35 %   Lymphs Abs 3.1 0.7 - 4.0 K/uL   Monocytes Relative 7 %   Monocytes Absolute 0.6 0.1 - 1.0 K/uL   Eosinophils Relative 2 %   Eosinophils Absolute 0.2 0.0 - 0.5 K/uL   Basophils Relative 1 %   Basophils Absolute 0.1 0.0 - 0.1 K/uL   Immature Granulocytes 1 %  Abs Immature Granulocytes 0.06 0.00 - 0.07 K/uL    Comment: Performed at [facility], [address], [place], [place] [address]  Comprehensive metabolic panel     Status: Abnormal   Collection Time: 08/02/18  5:24 AM  Result Value Ref Range   Sodium 135 135 - 145 mmol/L   Potassium 3.0 (L) 3.5 - 5.1 mmol/L   Chloride 102 98 - 111 mmol/L   CO2 23 22 - 32 mmol/L   Glucose, Bld 144 (H) 70 - 99 mg/dL   BUN 22 8 - 23 mg/dL   Creatinine, Ser 1.31 (H) 0.61 - 1.24 mg/dL   Calcium 8.9 8.9 - 10.3 mg/dL   Total Protein 6.3 (L) 6.5 - 8.1 g/dL   Albumin 3.0 (L) 3.5 - 5.0 g/dL   AST 35 15 - 41 U/L   ALT 30 0 - 44 U/L   Alkaline Phosphatase 50 38 - 126 U/L   Total Bilirubin 0.5 0.3 - 1.2 mg/dL   GFR calc non Af Amer 56 (L) >60 mL/min   GFR calc Af Amer >60 >60 mL/min   Anion gap 10 5 - 15    Comment: Performed at [facility], [address], [place], [place] [address]  Glucose, capillary     Status: Abnormal   Collection Time: 08/02/18  7:25  AM  Result Value Ref Range   Glucose-Capillary 142 (H) 70 - 99 mg/dL    ABGS No results for input(s): PHART, PO2ART, TCO2, HCO3 in the last 72 hours.  Invalid input(s): PCO2 CULTURES Recent Results (from the past 240 hour(s))  Urine culture     Status: Abnormal   Collection Time: 07/27/18  9:20 PM  Result Value Ref Range Status   Specimen Description   Final    URINE, CATHETERIZED Performed at [facility], [address] [place], [place] [address]    Special Requests   Final    NONE Performed at [facility], [address] [place], [place] [address]    Culture (A)  Final    >=100,000 COLONIES/mL PROTEUS MIRABILIS >=100,000 COLONIES/mL ESCHERICHIA COLI    Report Status 07/30/2018 FINAL  Final   Organism ID, Bacteria PROTEUS MIRABILIS (A)  Final   Organism ID, Bacteria ESCHERICHIA COLI (A)  Final      Susceptibility   Escherichia coli - MIC*    AMPICILLIN >=32 RESISTANT Resistant     CEFAZOLIN 16 SENSITIVE Sensitive     CEFTRIAXONE <=1 SENSITIVE Sensitive     CIPROFLOXACIN >=4 RESISTANT Resistant     GENTAMICIN <=1 SENSITIVE Sensitive     IMIPENEM <=0.25 SENSITIVE Sensitive     NITROFURANTOIN <=16 SENSITIVE Sensitive     TRIMETH/SULFA >=320 RESISTANT Resistant     AMPICILLIN/SULBACTAM >=32 RESISTANT Resistant     PIP/TAZO >=128 RESISTANT Resistant     * >=100,000 COLONIES/mL ESCHERICHIA COLI   Proteus mirabilis - MIC*    AMPICILLIN <=2 SENSITIVE Sensitive     CEFAZOLIN >=64 RESISTANT Resistant     CEFTRIAXONE <=1 SENSITIVE Sensitive     CIPROFLOXACIN <=0.25 SENSITIVE Sensitive     GENTAMICIN <=1 SENSITIVE Sensitive     IMIPENEM 2 SENSITIVE Sensitive     NITROFURANTOIN 128 RESISTANT Resistant     TRIMETH/SULFA <=20 SENSITIVE Sensitive     AMPICILLIN/SULBACTAM <=2 SENSITIVE Sensitive     PIP/TAZO <=4 SENSITIVE Sensitive     * >=100,000 COLONIES/mL PROTEUS MIRABILIS  C difficile quick scan w PCR reflex     Status: None   Collection Time:  07/27/18 10:13 PM  Result  Value Ref Range Status   C Diff antigen NEGATIVE NEGATIVE Final   C Diff toxin NEGATIVE NEGATIVE Final   C Diff interpretation No C. difficile detected.  Final    Comment: Performed at [facility], [address], [place], [place] [address]  Gastrointestinal Panel by PCR , Stool     Status: None   Collection Time: 07/27/18 10:13 PM  Result Value Ref Range Status   Campylobacter species NOT DETECTED NOT DETECTED Final   Plesimonas shigelloides NOT DETECTED NOT DETECTED Final   Salmonella species NOT DETECTED NOT DETECTED Final   Yersinia enterocolitica NOT DETECTED NOT DETECTED Final   Vibrio species NOT DETECTED NOT DETECTED Final   Vibrio cholerae NOT DETECTED NOT DETECTED Final   Enteroaggregative E coli (EAEC) NOT DETECTED NOT DETECTED Final   Enteropathogenic E coli (EPEC) NOT DETECTED NOT DETECTED Final   Enterotoxigenic E coli (ETEC) NOT DETECTED NOT DETECTED Final   Shiga like toxin producing E coli (STEC) NOT DETECTED NOT DETECTED Final   Shigella/Enteroinvasive E coli (EIEC) NOT DETECTED NOT DETECTED Final   Cryptosporidium NOT DETECTED NOT DETECTED Final   Cyclospora cayetanensis NOT DETECTED NOT DETECTED Final   Entamoeba histolytica NOT DETECTED NOT DETECTED Final   Giardia lamblia NOT DETECTED NOT DETECTED Final   Adenovirus F40/41 NOT DETECTED NOT DETECTED Final   Astrovirus NOT DETECTED NOT DETECTED Final   Norovirus GI/GII NOT DETECTED NOT DETECTED Final   Rotavirus A NOT DETECTED NOT DETECTED Final   Sapovirus (I, II, IV, and V) NOT DETECTED NOT DETECTED Final    Comment: Performed at [facility], [address] [place], [place] [address]  SARS Coronavirus 2 (CEPHEID- Performed in [place] hospital lab), Hosp Order     Status: None   Collection Time: 07/27/18 11:20 PM  Result Value Ref Range Status   SARS Coronavirus 2 NEGATIVE NEGATIVE Final    Comment: (NOTE) If result is NEGATIVE SARS-CoV-2 target nucleic acids are NOT DETECTED. The SARS-CoV-2  RNA is generally detectable in upper and lower  respiratory specimens during the acute phase of infection. The lowest  concentration of SARS-CoV-2 viral copies this assay can detect is 250  copies / mL. A negative result does not preclude SARS-CoV-2 infection  and should not be used as the sole basis for treatment or other  patient management decisions.  A negative result may occur with  improper specimen collection / handling, submission of specimen other  than nasopharyngeal swab, presence of viral mutation(s) within the  areas targeted by this assay, and inadequate number of viral copies  (<250 copies / mL). A negative result must be combined with clinical  observations, patient history, and epidemiological information. If result is POSITIVE SARS-CoV-2 target nucleic acids are DETECTED. The SARS-CoV-2 RNA is generally detectable in upper and lower  respiratory specimens dur ing the acute phase of infection.  Positive  results are indicative of active infection with SARS-CoV-2.  Clinical  correlation with patient history and other diagnostic information is  necessary to determine patient infection status.  Positive results do  not rule out bacterial infection or co-infection with other viruses. If result is PRESUMPTIVE POSTIVE SARS-CoV-2 nucleic acids MAY BE PRESENT.   A presumptive positive result was obtained on the submitted specimen  and confirmed on repeat testing.  While 2019 novel coronavirus  (SARS-CoV-2) nucleic acids may be present in the submitted sample  additional confirmatory testing may be necessary for epidemiological  and /  or clinical management purposes  to differentiate between  SARS-CoV-2 and other Sarbecovirus currently known to infect humans.  If clinically indicated additional testing with an alternate test  methodology [phone]) is advised. The SARS-CoV-2 RNA is generally  detectable in upper and lower respiratory sp ecimens during the acute  phase of  infection. The expected result is Negative. Fact Sheet for Patients:  StrictlyIdeas.no Fact Sheet for Healthcare Providers: BankingDealers.co.za This test is not yet approved or cleared by the [place] FDA and has been authorized for detection and/or diagnosis of SARS-CoV-2 by FDA under an Emergency Use Authorization (EUA).  This EUA will remain in effect (meaning this test can be used) for the duration of the COVID-19 declaration under Section 564(b)(1) of the Act, 21 U.S.C. section 360bbb-3(b)(1), unless the authorization is terminated or revoked sooner. Performed at [facility], [address], [place], [place] [address]   Blood Culture (routine x 2)     Status: None   Collection Time: 07/27/18 11:26 PM  Result Value Ref Range Status   Specimen Description BLOOD LEFT ANTECUBITAL  Final   Special Requests   Final    BOTTLES DRAWN AEROBIC AND ANAEROBIC Blood Culture adequate volume   Culture   Final    NO GROWTH 5 DAYS Performed at [facility], [address] [place], [place] [address]    Report Status 08/01/2018 FINAL  Final  Blood Culture (routine x 2)     Status: None   Collection Time: 07/27/18 11:28 PM  Result Value Ref Range Status   Specimen Description BLOOD RIGHT ANTECUBITAL  Final   Special Requests   Final    BOTTLES DRAWN AEROBIC AND ANAEROBIC Blood Culture adequate volume   Culture   Final    NO GROWTH 5 DAYS Performed at [facility], [address], [place], [place] [address]    Report Status 08/01/2018 FINAL  Final  MRSA PCR Screening     Status: None   Collection Time: 07/28/18  2:13 AM  Result Value Ref Range Status   MRSA by PCR NEGATIVE NEGATIVE Final    Comment:        The GeneXpert MRSA Assay (FDA approved for NASAL specimens only), is one component of a comprehensive MRSA colonization surveillance program. It is not intended to diagnose MRSA infection nor to guide or monitor treatment for MRSA  infections. Performed at [facility], [address] [place], [place] [address]    Studies/Results: No results found.  Medications:  Prior to Admission:  Medications Prior to Admission  Medication Sig Dispense Refill Last Dose  . acetaminophen (TYLENOL) 500 MG tablet Take 1,000 mg by mouth daily as needed for moderate pain or headache.    07/27/2018 at Unknown time  . albuterol (PROAIR HFA) 108 (90 BASE) MCG/ACT inhaler Inhale 2 puffs into the lungs every 6 (six) hours as needed for wheezing or shortness of breath.    unknown  . allopurinol (ZYLOPRIM) 300 MG tablet Take 300 mg by mouth daily.     07/27/2018 at Unknown time  . alum & mag hydroxide-simeth (MAALOX/MYLANTA) 200-200-20 MG/5ML suspension Take 15 mLs by mouth every 6 (six) hours as needed for indigestion or heartburn.   unknown  . atorvastatin (LIPITOR) 20 MG tablet Take 20 mg by mouth at bedtime.   07/27/2018 at Unknown time  . cholecalciferol (VITAMIN D) 1000 units tablet Take 1,000 Units by mouth daily.   unknown  . fluticasone (FLONASE) 50 MCG/ACT nasal spray Place 2 sprays into both  nostrils daily.    07/27/2018 at Unknown time  . furosemide (LASIX) 40 MG tablet Take 40 mg by mouth every morning. & 20 mg in the evening   07/27/2018 at Unknown time  . gabapentin (NEURONTIN) 400 MG capsule Take 400 mg by mouth daily.    07/27/2018 at Unknown time  . hydrALAZINE (APRESOLINE) 100 MG tablet Take 1 tablet (100 mg total) by mouth 3 (three) times daily. 90 tablet 3 07/27/2018 at Unknown time  . HYDROcodone-acetaminophen (NORCO/VICODIN) 5-325 MG tablet Take 1 tablet by mouth every 6 (six) hours as needed.   07/27/2018 at Unknown time  . insulin lispro (HUMALOG KWIKPEN) 100 UNIT/ML KiwkPen Inject 18-20 Units into the skin as directed. Inject 2 to 18 units based on sliding scale as directed as needed for blood glucose over 200   07/27/2018 at Unknown time  . LANTUS SOLOSTAR 100 UNIT/ML Solostar Pen Inject 20 Units into the skin daily.    07/27/2018  at Unknown time  . linaclotide (LINZESS) 145 MCG CAPS capsule Take 1 capsule (145 mcg total) by mouth daily before breakfast. 30 capsule 5 07/27/2018 at Unknown time  . loratadine (CLARITIN) 10 MG tablet Take 10 mg by mouth daily.   07/27/2018 at Unknown time  . metoCLOPramide (REGLAN) 5 MG tablet Take 5 mg by mouth 3 (three) times daily before meals.   07/27/2018 at Unknown time  . metoprolol tartrate (LOPRESSOR) 100 MG tablet Take 100 mg by mouth 2 (two) times daily.   07/27/2018 at Unknown time  . Multiple Vitamin (MULTIVITAMIN WITH MINERALS) TABS tablet Take 1 tablet by mouth daily.   07/27/2018 at Unknown time  . ondansetron (ZOFRAN) 4 MG tablet Take 1 tablet (4 mg total) by mouth every 8 (eight) hours as needed for nausea or vomiting. 30 tablet 1 07/27/2018 at Unknown time  . pantoprazole (PROTONIX) 40 MG tablet Take 1 tablet (40 mg total) by mouth daily.   07/27/2018 at Unknown time  . potassium chloride SA (K-DUR) 20 MEQ tablet Take 1 tablet (20 mEq total) by mouth 2 (two) times daily. 60 tablet 5 07/27/2018 at Unknown time  . tamsulosin (FLOMAX) 0.4 MG CAPS capsule Take 1 capsule by mouth daily.   07/27/2018 at Unknown time  . traZODone (DESYREL) 50 MG tablet Take 0.5 tablets (25 mg total) by mouth at bedtime as needed for sleep. 30 tablet 5 07/27/2018 at Unknown time  . vitamin C (ASCORBIC ACID) 500 MG tablet Take 500 mg by mouth 2 (two) times daily.   unknown   Scheduled: . allopurinol  100 mg Oral Daily  . atorvastatin  20 mg Oral QHS  . cholecalciferol  1,000 Units Oral Daily  . fluticasone  2 spray Each Nare Daily  . gabapentin  400 mg Oral Daily  . insulin aspart  0-15 Units Subcutaneous TID WC  . insulin aspart  0-5 Units Subcutaneous QHS  . insulin aspart  4 Units Subcutaneous TID WC  . insulin glargine  20 Units Subcutaneous BID  . isosorbide mononitrate  30 mg Oral Daily  . linaclotide  290 mcg Oral QAC breakfast  . loratadine  10 mg Oral Daily  . metoCLOPramide  5 mg Oral TID AC   . metoprolol tartrate  100 mg Oral BID  . pantoprazole  40 mg Oral Daily  . tamsulosin  0.4 mg Oral Daily   Continuous: . cefTRIAXone (ROCEPHIN)  IV 1 g (08/01/18 2043)   [id number] **OR** acetaminophen, albuterol, HYDROcodone-acetaminophen, ondansetron **OR** ondansetron (ZOFRAN) IV, traZODone  Assesment: He was admitted with a urinary tract infection.  This has 2 organisms.  This is related to his indwelling Foley catheter.  He has BPH and had urinary retention.  He is scheduled to see urology as an outpatient.  He is much improved.  He has chronic constipation and had some diarrhea and it appeared that his problems urinating were at least partially related to fecal impaction  He has debility and it was suggested that he go to a skilled care facility but he wants to go home  He has chronic systolic heart failure but appears euvolemic  He has COPD which is stable  He has chronic kidney disease which is unchanged.  I think he was dehydrated on admission and had some increase in his chronic kidney disease from that  He has an ICD device which has not fired  He has a's small stage I decubitus on his sacrum Principal Problem:   Lower urinary tract infectious disease Active Problems:   COPD (chronic obstructive pulmonary disease) (HCC)   SVT (supraventricular tachycardia) (HCC)   Debility   Acute renal failure superimposed on stage 3 chronic kidney disease (HCC)   Chronic systolic CHF (congestive heart failure) ([place])   ICD (implantable cardioverter-defibrillator) in place   Pressure injury of skin    Plan: He will receive his antibiotic today and go home.  He will have home health services.    LOS: 5 days   Alonza Bogus 08/02/2018, 9:16 AM

## 2018-08-03 DIAGNOSIS — I509 Heart failure, unspecified: Secondary | ICD-10-CM | POA: Diagnosis not present

## 2018-08-03 DIAGNOSIS — G4733 Obstructive sleep apnea (adult) (pediatric): Secondary | ICD-10-CM | POA: Diagnosis not present

## 2018-08-06 ENCOUNTER — Ambulatory Visit (INDEPENDENT_AMBULATORY_CARE_PROVIDER_SITE_OTHER): Payer: Medicare Other | Admitting: *Deleted

## 2018-08-06 DIAGNOSIS — I5022 Chronic systolic (congestive) heart failure: Secondary | ICD-10-CM

## 2018-08-06 DIAGNOSIS — I255 Ischemic cardiomyopathy: Secondary | ICD-10-CM

## 2018-08-07 LAB — CUP PACEART REMOTE DEVICE CHECK
Battery Remaining Longevity: 156 mo
Battery Remaining Percentage: 100 %
Brady Statistic RA Percent Paced: 1 %
Brady Statistic RV Percent Paced: 1 %
Date Time Interrogation Session: 20200604070100
HighPow Impedance: 56 Ohm
Implantable Lead Implant Date: 20180410
Implantable Lead Implant Date: 20180410
Implantable Lead Location: 753859
Implantable Lead Location: 753860
Implantable Lead Model: 292
Implantable Lead Model: 7740
Implantable Lead Serial Number: 420134
Implantable Lead Serial Number: 693412
Implantable Pulse Generator Implant Date: 20180410
Lead Channel Impedance Value: 372 Ohm
Lead Channel Impedance Value: 604 Ohm
Lead Channel Setting Pacing Amplitude: 2 V
Lead Channel Setting Pacing Amplitude: 2.4 V
Lead Channel Setting Pacing Pulse Width: 0.4 ms
Lead Channel Setting Sensing Sensitivity: 0.5 mV
Pulse Gen Serial Number: 533916

## 2018-08-10 ENCOUNTER — Ambulatory Visit (INDEPENDENT_AMBULATORY_CARE_PROVIDER_SITE_OTHER): Payer: Medicare Other

## 2018-08-10 DIAGNOSIS — N189 Chronic kidney disease, unspecified: Secondary | ICD-10-CM | POA: Diagnosis not present

## 2018-08-10 DIAGNOSIS — J449 Chronic obstructive pulmonary disease, unspecified: Secondary | ICD-10-CM | POA: Diagnosis not present

## 2018-08-10 DIAGNOSIS — I5022 Chronic systolic (congestive) heart failure: Secondary | ICD-10-CM

## 2018-08-10 DIAGNOSIS — Z95810 Presence of automatic (implantable) cardiac defibrillator: Secondary | ICD-10-CM

## 2018-08-10 DIAGNOSIS — E1121 Type 2 diabetes mellitus with diabetic nephropathy: Secondary | ICD-10-CM | POA: Diagnosis not present

## 2018-08-10 DIAGNOSIS — I129 Hypertensive chronic kidney disease with stage 1 through stage 4 chronic kidney disease, or unspecified chronic kidney disease: Secondary | ICD-10-CM | POA: Diagnosis not present

## 2018-08-12 ENCOUNTER — Encounter: Payer: Self-pay | Admitting: Cardiology

## 2018-08-12 ENCOUNTER — Telehealth (INDEPENDENT_AMBULATORY_CARE_PROVIDER_SITE_OTHER): Payer: Medicare Other | Admitting: Cardiology

## 2018-08-12 VITALS — Ht 66.0 in | Wt 240.0 lb

## 2018-08-12 DIAGNOSIS — I5022 Chronic systolic (congestive) heart failure: Secondary | ICD-10-CM

## 2018-08-12 DIAGNOSIS — E876 Hypokalemia: Secondary | ICD-10-CM

## 2018-08-12 MED ORDER — METOPROLOL SUCCINATE ER 100 MG PO TB24: 100.0000 mg | ORAL_TABLET | Freq: Two times a day (BID) | ORAL | 1 refills | Status: DC

## 2018-08-12 NOTE — Addendum Note (Signed)
Addended by: Julian Hy T on: 08/12/2018 10:13 AM   Modules accepted: Orders

## 2018-08-12 NOTE — Progress Notes (Signed)
Virtual Visit via Telephone Note   This visit type was conducted due to national recommendations for restrictions regarding the COVID-19 Pandemic (e.g. social distancing) in an effort to limit this patient's exposure and mitigate transmission in our community.  Due to his co-morbid illnesses, this patient is at least at moderate risk for complications without adequate follow up.  This format is felt to be most appropriate for this patient at this time.  The patient did not have access to video technology/had technical difficulties with video requiring transitioning to audio format only (telephone).  All issues noted in this document were discussed and addressed.  No physical exam could be performed with this format.  Please refer to the patient's chart for his  consent to telehealth for [facility].   Date:  08/12/2018   ID:  Frank Hoover, DOB 12-Mar-1950, MRN [id number]  Patient Location: Home Provider Location: Office  PCP:  Sinda Du, MD  Cardiologist:  Carlyle Dolly, MD  Electrophysiologist:  None   Evaluation Performed:  Follow-Up Visit  Chief Complaint:  3 week follow up  History of Present Illness:    Frank Hoover is a 68 y.o. male seen today for follow up of the following medical problems. This is a focused visiti on his history of chronic systolic heart failure, for more detailed history please see prior clinic notes.      1. Chronic systolic HF - echo 0/9381 LVEF 25%, diffuse hypokinesis. Mod RV dysfunction.  - new diagnosis 08/2015 during admission with sepsis - thought possible tachcyardia mediated, however he has not had an ischemic evaluation - echo Jan 2018 LVEF 30-35%.  - ICD followed by EP .medical therapy limiteddue to CKDand previous low bp's     - home weights stable 240 lbs. No recent edema. No SOB/DOE - compliant with meds - his fairly recent issues with fluid overload and renal failure are much improved since he was  diagnosed with urinary obstruction and now has chronic foley catheter.     2. CKDIV - followed by Dr Hinda Lenis - marked improvement in renal function during last admission, Cr down to 1.3.   3. Urinary retention - issues with urinary retention during 06/2018 admission, discharged with foley catheter.    The patient does not have symptoms concerning for COVID-19 infection (fever, chills, cough, or new shortness of breath).    Past Medical History:  Diagnosis Date  . Adenomatous colon polyp 10/30/09   Serrated adenoma removed during Colonoscopy   . AICD (automatic cardioverter/defibrillator) present 06/11/2016  . Arthritis    "legs" (09/27/2015)  . Atrial flutter with rapid ventricular response ([place]) 09/27/2015  . CHF (congestive heart failure) ([place])   . CKD (chronic kidney disease) stage 3, GFR 30-59 ml/min (HCC)   . COPD (chronic obstructive pulmonary disease) ([place])   . Diverticulosis of colon   . Dysrhythmia   . Essential hypertension   . Gastroparesis   . GERD (gastroesophageal reflux disease)   . Gout   . Hyperlipemia   . Hypertension   . OA (osteoarthritis)   . Type 2 diabetes mellitus ([place])    Past Surgical History:  Procedure Laterality Date  . CARDIOVERSION N/A 08/22/2015   Procedure: CARDIOVERSION;  Surgeon: Larey Dresser, MD;  Location: [place];  Service: Cardiovascular;  Laterality: N/A;  . CARDIOVERSION N/A 09/01/2015   Procedure: CARDIOVERSION;  Surgeon: Lelon Perla, MD;  Location: [place];  Service: Cardiovascular;  Laterality: N/A;  . CATARACT EXTRACTION W/ INTRAOCULAR LENS  IMPLANT, BILATERAL Bilateral   . COLONOSCOPY  10/30/09   JAS:NKNL-ZJQBH diverticulum/serrated adenoma from ICV, next colonoscopy due 10/2012  . COLONOSCOPY N/A 09/18/2012   ALP:FXTKWIO mucosa that was seen appeared normal, however most of it was not seen due to be very poor prep  . COLONOSCOPY N/A 04/08/2013   XBD:ZHGDJME polyp removed/inadequate preparation  . COLONOSCOPY  N/A 06/22/2014   Procedure: COLONOSCOPY;  Surgeon: Daneil Dolin, MD;  [facility];  Service: Endoscopy;  Laterality: N/A;  115   . ELECTROPHYSIOLOGIC STUDY N/A 09/27/2015   Procedure: SVT Ablation;  Surgeon: Evans Lance, MD;  Location: [place] CV LAB;  Service: Cardiovascular;  Laterality: N/A;  . EP IMPLANTABLE DEVICE  06/11/2016  . ESOPHAGOGASTRODUODENOSCOPY  10/30/09   QAS:[id number]   . FLEXIBLE SIGMOIDOSCOPY N/A 08/25/2015   Procedure: FLEXIBLE SIGMOIDOSCOPY;  Surgeon: Jerene Bears, MD;  Location: [facility] END[facility];  Service: Endoscopy;  Laterality: N/A;  . ICD IMPLANT N/A 06/11/2016   Procedure: ICD Implant;  Surgeon: Evans Lance, MD;  Location: [place] CV LAB;  Service: Cardiovascular;  Laterality: N/A;  . TEE WITHOUT CARDIOVERSION N/A 08/22/2015   Procedure: TRANSESOPHAGEAL ECHOCARDIOGRAM (TEE);  Surgeon: Larey Dresser, MD;  Location: [place];  Service: Cardiovascular;  Laterality: N/A;  . TEE WITHOUT CARDIOVERSION  09/27/2015   Procedure: Transesophageal Echocardiogram (Tee);  Surgeon: Evans Lance, MD;  Location: [place] CV LAB;  Service: Cardiovascular;;  . TONSILLECTOMY  1950s/1960s     No outpatient medications have been marked as taking for the 08/12/18 encounter (Appointment) with Arnoldo Lenis, MD.     Allergies:   Patient has no known allergies.   Social History   Tobacco Use  . Smoking status: Former Smoker    Packs/day: 1.00    Years: 4.00    Pack years: 4.00    Types: Cigarettes    Last attempt to quit: 08/26/2010    Years since quitting: 7.9  . Smokeless tobacco: Never Used  Substance Use Topics  . Alcohol use: No    Alcohol/week: 0.0 standard drinks    Comment: "stopped in 2012 when I quit smoking"  . Drug use: No     Family Hx: The patient's family history includes COPD in his father; Diabetes in his mother; Hypertension in his mother. There is no history of Colon cancer.  ROS:   Please see the history of present  illness.     All other systems reviewed and are negative.   Prior CV studies:   The following studies were reviewed today:  Jan 2018 echo Study Conclusions  - Left ventricle: The cavity size was normal. Systolic function was moderately to severely reduced. The estimated ejection fraction was in the range of 30% to 35%. Diffuse hypokinesis. Doppler parameters are consistent with abnormal left ventricular relaxation (grade 1 diastolic dysfunction). Mild to moderate LVH. - Mitral valve: There was mild regurgitation. - Left atrium: The atrium was mildly dilated. - Right ventricle: Systolic function was mildly to moderately reduced. - Atrial septum: No defect or patent foramen ovale was identified.  Labs/Other Tests and Data Reviewed:    EKG:  No ECG reviewed.  Recent Labs: 06/27/2018: Magnesium 2.1 08/02/2018: ALT 30; BUN 22; Creatinine, Ser 1.31; Hemoglobin 12.1; Platelets 232; Potassium 3.0; Sodium 135   Recent Lipid Panel Lab Results  Component Value Date/Time   CHOL 79 08/15/2015 04:22 AM   TRIG 38 08/15/2015 04:22 AM   HDL 34 (L) 08/15/2015 04:22 AM   CHOLHDL  2.3 08/15/2015 04:22 AM   LDLCALC 37 08/15/2015 04:22 AM    Wt Readings from Last 3 Encounters:  07/29/18 236 lb 1.8 oz (107.1 kg)  07/20/18 240 lb (108.9 kg)  07/01/18 252 lb 13.9 oz (114.7 kg)     Objective:    Vital Signs:   Today's Vitals   08/12/18 0943  Weight: 240 lb (108.9 kg)  Height: 5' 6"  (1.676 m)   Body mass index is 38.74 kg/m. Normal affect. Normal speech pattern and tone. Comfortable, no apparent distress. No audible signs of SOB or wheezing.   ASSESSMENT & PLAN:      1. Chronic systolic HF - medical therapy limited by CKDand prior low bp's, though more recently bp's have been elevated -marked improvement in fluid status and renal function after diagnosis of urinary obstruction now with chronic foley catheter - continue to monitor at this time. If renal function  continues to improve could consider alterations in CHF regimen, we had also deferred cath previously due to renal failure in setting of his drop in LVEF.  - had been on toprol 153m bid, med list today reports lopressor 1018mbid. We will clarify with his pharmacy, should be on toprol due to his low LVEF.   2. Hypokalemia - repeat BMET/Mg    COVID-19 Education: The signs and symptoms of COVID-19 were discussed with the patient and how to seek care for testing (follow up with PCP or arrange E-visit).  The importance of social distancing was discussed today.  Time:   Today, I have spent 12 minutes with the patient with telehealth technology discussing the above problems.     Medication Adjustments/Labs and Tests Ordered: Current medicines are reviewed at length with the patient today.  Concerns regarding medicines are outlined above.   Tests Ordered: No orders of the defined types were placed in this encounter.   Medication Changes: No orders of the defined types were placed in this encounter.   Disposition:  Follow up 2 months  Signed, BrCarlyle DollyMD  08/12/2018 8:18 AM    [place]

## 2018-08-12 NOTE — Patient Instructions (Addendum)
Your physician recommends that you schedule a follow-up appointment in: 2 [place] has recommended you make the following change in your medication:   STOP LOPRESSOR  START TOPROL XL 100 MG TWICE DAILY    Your physician recommends that you return for lab work BMP/MG - YOU DO NOT NEED TO BE FASTING - [place]  Thank you for choosing [facility]!!

## 2018-08-12 NOTE — Progress Notes (Signed)
EPIC Encounter for ICM Monitoring  Patient Name: Frank Hoover is a 68 y.o. male Date: 08/12/2018 Primary Care Physican: Sinda Du, MD Primary Cardiologist:Br[place] Electrophysiologist:Taylor Nephrologist:Dr Befakadu 07/20/2018 Weight:240lbs   Transmission reviewed.  Patient has telehealth visit with Dr Harl Bowie today.      08/12/2018 [employer] Heart Failure Indexis 5 and has returned to normal.  Prescribed: Furosemide40 mg take 1 tabletevery morning and 0.5 tablet (20 mg total) every evening.  All meds are in blister packs.  Labs: 08/02/2018 Creatinine 1.31, BUN 22, Potassium 3.0, Sodium 135, GFR 56->60 07/29/2018 Creatinine 2.07, BUN 39, Potassium 3.2, Sodium 138, GFR 32-38  07/28/2018 Creatinine 2.84, BUN 47, Potassium 3.9, Sodium 134, GFR 22-26  07/27/2018 Creatinine 2.84, BUN 47, Potassium 3.7, Sodium 131, GFR 21-24  07/01/2018 Creatinine 1.36, BUN 26, Potassium 3.3, Sodium 141, GFR 44-51  06/29/2018 Creatinine 2.04, BUN 37, Potassium 3.4, Sodium 139, GFR 33-38  06/28/2018 Creatinine 2.34, BUN 38, Potassium 3.3, Sodium 136, GFR 28-32  06/27/2018 Creatinine 2.59, BUN 40, Potassium 3.3, Sodium 137, GFR 25-29  06/26/2018 Creatinine 3.09, BUN 43, Potassium 3.2, Sodium 133, GFR 20-23  06/10/2018 Creatinine 3.38, BUN 33, Potassium 3.5, Sodium 136, GFR 18-21 05/18/2018 Creatinine2.52, BUN32, Potassium3.5, Sodium139, [id number], Care Everywhere A complete set of results can be found in Results Review.  Recommendations: Patient has a telehealth visit with Dr Harl Bowie today.    Follow-up plan: ICM clinic phone appointment on7/13/2020.  Copy of ICM check sent to Dr. Flossie Dibble Dr Harl Bowie (pt has telehealth visit today).     [place], RN 08/12/2018 9:35 AM

## 2018-08-13 NOTE — Progress Notes (Signed)
Remote ICD transmission.   

## 2018-08-16 DIAGNOSIS — K219 Gastro-esophageal reflux disease without esophagitis: Secondary | ICD-10-CM | POA: Diagnosis not present

## 2018-08-16 DIAGNOSIS — Z794 Long term (current) use of insulin: Secondary | ICD-10-CM | POA: Diagnosis not present

## 2018-08-16 DIAGNOSIS — N390 Urinary tract infection, site not specified: Secondary | ICD-10-CM | POA: Diagnosis not present

## 2018-08-16 DIAGNOSIS — Z9181 History of falling: Secondary | ICD-10-CM | POA: Diagnosis not present

## 2018-08-16 DIAGNOSIS — Z466 Encounter for fitting and adjustment of urinary device: Secondary | ICD-10-CM | POA: Diagnosis not present

## 2018-08-16 DIAGNOSIS — E1165 Type 2 diabetes mellitus with hyperglycemia: Secondary | ICD-10-CM | POA: Diagnosis not present

## 2018-08-16 DIAGNOSIS — N183 Chronic kidney disease, stage 3 (moderate): Secondary | ICD-10-CM | POA: Diagnosis not present

## 2018-08-16 DIAGNOSIS — R338 Other retention of urine: Secondary | ICD-10-CM | POA: Diagnosis not present

## 2018-08-16 DIAGNOSIS — I130 Hypertensive heart and chronic kidney disease with heart failure and stage 1 through stage 4 chronic kidney disease, or unspecified chronic kidney disease: Secondary | ICD-10-CM | POA: Diagnosis not present

## 2018-08-16 DIAGNOSIS — K5900 Constipation, unspecified: Secondary | ICD-10-CM | POA: Diagnosis not present

## 2018-08-16 DIAGNOSIS — I5020 Unspecified systolic (congestive) heart failure: Secondary | ICD-10-CM | POA: Diagnosis not present

## 2018-08-16 DIAGNOSIS — K5120 Ulcerative (chronic) proctitis without complications: Secondary | ICD-10-CM | POA: Diagnosis not present

## 2018-08-16 DIAGNOSIS — Z95810 Presence of automatic (implantable) cardiac defibrillator: Secondary | ICD-10-CM | POA: Diagnosis not present

## 2018-08-16 DIAGNOSIS — G4730 Sleep apnea, unspecified: Secondary | ICD-10-CM | POA: Diagnosis not present

## 2018-08-16 DIAGNOSIS — I429 Cardiomyopathy, unspecified: Secondary | ICD-10-CM | POA: Diagnosis not present

## 2018-08-16 DIAGNOSIS — E1122 Type 2 diabetes mellitus with diabetic chronic kidney disease: Secondary | ICD-10-CM | POA: Diagnosis not present

## 2018-08-16 DIAGNOSIS — N049 Nephrotic syndrome with unspecified morphologic changes: Secondary | ICD-10-CM | POA: Diagnosis not present

## 2018-08-20 DIAGNOSIS — Z9181 History of falling: Secondary | ICD-10-CM | POA: Diagnosis not present

## 2018-08-20 DIAGNOSIS — G4730 Sleep apnea, unspecified: Secondary | ICD-10-CM | POA: Diagnosis not present

## 2018-08-20 DIAGNOSIS — K5900 Constipation, unspecified: Secondary | ICD-10-CM | POA: Diagnosis not present

## 2018-08-20 DIAGNOSIS — I429 Cardiomyopathy, unspecified: Secondary | ICD-10-CM | POA: Diagnosis not present

## 2018-08-20 DIAGNOSIS — I130 Hypertensive heart and chronic kidney disease with heart failure and stage 1 through stage 4 chronic kidney disease, or unspecified chronic kidney disease: Secondary | ICD-10-CM | POA: Diagnosis not present

## 2018-08-20 DIAGNOSIS — R338 Other retention of urine: Secondary | ICD-10-CM | POA: Diagnosis not present

## 2018-08-20 DIAGNOSIS — E1122 Type 2 diabetes mellitus with diabetic chronic kidney disease: Secondary | ICD-10-CM | POA: Diagnosis not present

## 2018-08-20 DIAGNOSIS — Z466 Encounter for fitting and adjustment of urinary device: Secondary | ICD-10-CM | POA: Diagnosis not present

## 2018-08-20 DIAGNOSIS — E1165 Type 2 diabetes mellitus with hyperglycemia: Secondary | ICD-10-CM | POA: Diagnosis not present

## 2018-08-20 DIAGNOSIS — N049 Nephrotic syndrome with unspecified morphologic changes: Secondary | ICD-10-CM | POA: Diagnosis not present

## 2018-08-20 DIAGNOSIS — Z794 Long term (current) use of insulin: Secondary | ICD-10-CM | POA: Diagnosis not present

## 2018-08-20 DIAGNOSIS — N390 Urinary tract infection, site not specified: Secondary | ICD-10-CM | POA: Diagnosis not present

## 2018-08-20 DIAGNOSIS — K219 Gastro-esophageal reflux disease without esophagitis: Secondary | ICD-10-CM | POA: Diagnosis not present

## 2018-08-20 DIAGNOSIS — K5120 Ulcerative (chronic) proctitis without complications: Secondary | ICD-10-CM | POA: Diagnosis not present

## 2018-08-20 DIAGNOSIS — N183 Chronic kidney disease, stage 3 (moderate): Secondary | ICD-10-CM | POA: Diagnosis not present

## 2018-08-20 DIAGNOSIS — Z95810 Presence of automatic (implantable) cardiac defibrillator: Secondary | ICD-10-CM | POA: Diagnosis not present

## 2018-08-20 DIAGNOSIS — I5020 Unspecified systolic (congestive) heart failure: Secondary | ICD-10-CM | POA: Diagnosis not present

## 2018-08-25 DIAGNOSIS — G4733 Obstructive sleep apnea (adult) (pediatric): Secondary | ICD-10-CM | POA: Diagnosis not present

## 2018-08-25 DIAGNOSIS — R0902 Hypoxemia: Secondary | ICD-10-CM | POA: Diagnosis not present

## 2018-08-27 DIAGNOSIS — R338 Other retention of urine: Secondary | ICD-10-CM | POA: Diagnosis not present

## 2018-08-27 DIAGNOSIS — Z794 Long term (current) use of insulin: Secondary | ICD-10-CM | POA: Diagnosis not present

## 2018-08-27 DIAGNOSIS — Z9181 History of falling: Secondary | ICD-10-CM | POA: Diagnosis not present

## 2018-08-27 DIAGNOSIS — K5900 Constipation, unspecified: Secondary | ICD-10-CM | POA: Diagnosis not present

## 2018-08-27 DIAGNOSIS — I5020 Unspecified systolic (congestive) heart failure: Secondary | ICD-10-CM | POA: Diagnosis not present

## 2018-08-27 DIAGNOSIS — E1122 Type 2 diabetes mellitus with diabetic chronic kidney disease: Secondary | ICD-10-CM | POA: Diagnosis not present

## 2018-08-27 DIAGNOSIS — K5120 Ulcerative (chronic) proctitis without complications: Secondary | ICD-10-CM | POA: Diagnosis not present

## 2018-08-27 DIAGNOSIS — N183 Chronic kidney disease, stage 3 (moderate): Secondary | ICD-10-CM | POA: Diagnosis not present

## 2018-08-27 DIAGNOSIS — Z95810 Presence of automatic (implantable) cardiac defibrillator: Secondary | ICD-10-CM | POA: Diagnosis not present

## 2018-08-27 DIAGNOSIS — N390 Urinary tract infection, site not specified: Secondary | ICD-10-CM | POA: Diagnosis not present

## 2018-08-27 DIAGNOSIS — I429 Cardiomyopathy, unspecified: Secondary | ICD-10-CM | POA: Diagnosis not present

## 2018-08-27 DIAGNOSIS — Z466 Encounter for fitting and adjustment of urinary device: Secondary | ICD-10-CM | POA: Diagnosis not present

## 2018-08-27 DIAGNOSIS — K219 Gastro-esophageal reflux disease without esophagitis: Secondary | ICD-10-CM | POA: Diagnosis not present

## 2018-08-27 DIAGNOSIS — G4730 Sleep apnea, unspecified: Secondary | ICD-10-CM | POA: Diagnosis not present

## 2018-08-27 DIAGNOSIS — E1165 Type 2 diabetes mellitus with hyperglycemia: Secondary | ICD-10-CM | POA: Diagnosis not present

## 2018-08-27 DIAGNOSIS — I130 Hypertensive heart and chronic kidney disease with heart failure and stage 1 through stage 4 chronic kidney disease, or unspecified chronic kidney disease: Secondary | ICD-10-CM | POA: Diagnosis not present

## 2018-08-27 DIAGNOSIS — N049 Nephrotic syndrome with unspecified morphologic changes: Secondary | ICD-10-CM | POA: Diagnosis not present

## 2018-09-01 DIAGNOSIS — N189 Chronic kidney disease, unspecified: Secondary | ICD-10-CM | POA: Diagnosis not present

## 2018-09-01 DIAGNOSIS — R3914 Feeling of incomplete bladder emptying: Secondary | ICD-10-CM | POA: Diagnosis not present

## 2018-09-01 DIAGNOSIS — Z7689 Persons encountering health services in other specified circumstances: Secondary | ICD-10-CM | POA: Diagnosis not present

## 2018-09-01 DIAGNOSIS — R338 Other retention of urine: Secondary | ICD-10-CM | POA: Diagnosis not present

## 2018-09-01 DIAGNOSIS — N3000 Acute cystitis without hematuria: Secondary | ICD-10-CM | POA: Diagnosis not present

## 2018-09-02 DIAGNOSIS — N183 Chronic kidney disease, stage 3 (moderate): Secondary | ICD-10-CM | POA: Diagnosis not present

## 2018-09-02 DIAGNOSIS — I509 Heart failure, unspecified: Secondary | ICD-10-CM | POA: Diagnosis not present

## 2018-09-02 DIAGNOSIS — E1122 Type 2 diabetes mellitus with diabetic chronic kidney disease: Secondary | ICD-10-CM | POA: Diagnosis not present

## 2018-09-02 DIAGNOSIS — Z794 Long term (current) use of insulin: Secondary | ICD-10-CM | POA: Diagnosis not present

## 2018-09-02 DIAGNOSIS — N049 Nephrotic syndrome with unspecified morphologic changes: Secondary | ICD-10-CM | POA: Diagnosis not present

## 2018-09-02 DIAGNOSIS — R338 Other retention of urine: Secondary | ICD-10-CM | POA: Diagnosis not present

## 2018-09-02 DIAGNOSIS — Z95810 Presence of automatic (implantable) cardiac defibrillator: Secondary | ICD-10-CM | POA: Diagnosis not present

## 2018-09-02 DIAGNOSIS — Z9181 History of falling: Secondary | ICD-10-CM | POA: Diagnosis not present

## 2018-09-02 DIAGNOSIS — G4730 Sleep apnea, unspecified: Secondary | ICD-10-CM | POA: Diagnosis not present

## 2018-09-02 DIAGNOSIS — K219 Gastro-esophageal reflux disease without esophagitis: Secondary | ICD-10-CM | POA: Diagnosis not present

## 2018-09-02 DIAGNOSIS — G4733 Obstructive sleep apnea (adult) (pediatric): Secondary | ICD-10-CM | POA: Diagnosis not present

## 2018-09-02 DIAGNOSIS — Z466 Encounter for fitting and adjustment of urinary device: Secondary | ICD-10-CM | POA: Diagnosis not present

## 2018-09-02 DIAGNOSIS — I130 Hypertensive heart and chronic kidney disease with heart failure and stage 1 through stage 4 chronic kidney disease, or unspecified chronic kidney disease: Secondary | ICD-10-CM | POA: Diagnosis not present

## 2018-09-02 DIAGNOSIS — E1165 Type 2 diabetes mellitus with hyperglycemia: Secondary | ICD-10-CM | POA: Diagnosis not present

## 2018-09-02 DIAGNOSIS — I429 Cardiomyopathy, unspecified: Secondary | ICD-10-CM | POA: Diagnosis not present

## 2018-09-02 DIAGNOSIS — K5120 Ulcerative (chronic) proctitis without complications: Secondary | ICD-10-CM | POA: Diagnosis not present

## 2018-09-02 DIAGNOSIS — I5020 Unspecified systolic (congestive) heart failure: Secondary | ICD-10-CM | POA: Diagnosis not present

## 2018-09-02 DIAGNOSIS — K5900 Constipation, unspecified: Secondary | ICD-10-CM | POA: Diagnosis not present

## 2018-09-07 ENCOUNTER — Inpatient Hospital Stay (HOSPITAL_COMMUNITY)
Admission: EM | Admit: 2018-09-07 | Discharge: 2018-09-12 | DRG: 683 | Disposition: A | Discharge status: Other Acute Inpatient Hospital | Payer: Medicare Other | Attending: Pulmonary Disease | Admitting: Pulmonary Disease

## 2018-09-07 ENCOUNTER — Other Ambulatory Visit: Payer: Self-pay

## 2018-09-07 ENCOUNTER — Encounter (HOSPITAL_COMMUNITY): Payer: Self-pay | Admitting: *Deleted

## 2018-09-07 ENCOUNTER — Emergency Department (HOSPITAL_COMMUNITY): Payer: Medicare Other

## 2018-09-07 DIAGNOSIS — M109 Gout, unspecified: Secondary | ICD-10-CM | POA: Diagnosis not present

## 2018-09-07 DIAGNOSIS — Z79891 Long term (current) use of opiate analgesic: Secondary | ICD-10-CM

## 2018-09-07 DIAGNOSIS — E1143 Type 2 diabetes mellitus with diabetic autonomic (poly)neuropathy: Secondary | ICD-10-CM | POA: Diagnosis not present

## 2018-09-07 DIAGNOSIS — I428 Other cardiomyopathies: Secondary | ICD-10-CM | POA: Diagnosis present

## 2018-09-07 DIAGNOSIS — I5022 Chronic systolic (congestive) heart failure: Secondary | ICD-10-CM | POA: Diagnosis present

## 2018-09-07 DIAGNOSIS — E1165 Type 2 diabetes mellitus with hyperglycemia: Secondary | ICD-10-CM | POA: Diagnosis not present

## 2018-09-07 DIAGNOSIS — R531 Weakness: Secondary | ICD-10-CM

## 2018-09-07 DIAGNOSIS — K5120 Ulcerative (chronic) proctitis without complications: Secondary | ICD-10-CM | POA: Diagnosis not present

## 2018-09-07 DIAGNOSIS — I480 Paroxysmal atrial fibrillation: Secondary | ICD-10-CM | POA: Diagnosis not present

## 2018-09-07 DIAGNOSIS — I959 Hypotension, unspecified: Secondary | ICD-10-CM | POA: Diagnosis not present

## 2018-09-07 DIAGNOSIS — Z794 Long term (current) use of insulin: Secondary | ICD-10-CM | POA: Diagnosis not present

## 2018-09-07 DIAGNOSIS — Z79899 Other long term (current) drug therapy: Secondary | ICD-10-CM

## 2018-09-07 DIAGNOSIS — I5020 Unspecified systolic (congestive) heart failure: Secondary | ICD-10-CM | POA: Diagnosis not present

## 2018-09-07 DIAGNOSIS — I429 Cardiomyopathy, unspecified: Secondary | ICD-10-CM | POA: Diagnosis not present

## 2018-09-07 DIAGNOSIS — Z87891 Personal history of nicotine dependence: Secondary | ICD-10-CM | POA: Diagnosis not present

## 2018-09-07 DIAGNOSIS — R338 Other retention of urine: Secondary | ICD-10-CM | POA: Diagnosis not present

## 2018-09-07 DIAGNOSIS — J449 Chronic obstructive pulmonary disease, unspecified: Secondary | ICD-10-CM | POA: Diagnosis not present

## 2018-09-07 DIAGNOSIS — Z86010 Personal history of colonic polyps: Secondary | ICD-10-CM | POA: Diagnosis not present

## 2018-09-07 DIAGNOSIS — G4730 Sleep apnea, unspecified: Secondary | ICD-10-CM | POA: Diagnosis not present

## 2018-09-07 DIAGNOSIS — E1122 Type 2 diabetes mellitus with diabetic chronic kidney disease: Secondary | ICD-10-CM | POA: Diagnosis present

## 2018-09-07 DIAGNOSIS — N400 Enlarged prostate without lower urinary tract symptoms: Secondary | ICD-10-CM | POA: Diagnosis present

## 2018-09-07 DIAGNOSIS — G4700 Insomnia, unspecified: Secondary | ICD-10-CM | POA: Diagnosis not present

## 2018-09-07 DIAGNOSIS — L8990 Pressure ulcer of unspecified site, unspecified stage: Secondary | ICD-10-CM | POA: Diagnosis present

## 2018-09-07 DIAGNOSIS — Z95810 Presence of automatic (implantable) cardiac defibrillator: Secondary | ICD-10-CM | POA: Diagnosis not present

## 2018-09-07 DIAGNOSIS — K3184 Gastroparesis: Secondary | ICD-10-CM | POA: Diagnosis present

## 2018-09-07 DIAGNOSIS — N390 Urinary tract infection, site not specified: Secondary | ICD-10-CM | POA: Diagnosis present

## 2018-09-07 DIAGNOSIS — K5900 Constipation, unspecified: Secondary | ICD-10-CM | POA: Diagnosis present

## 2018-09-07 DIAGNOSIS — Z833 Family history of diabetes mellitus: Secondary | ICD-10-CM

## 2018-09-07 DIAGNOSIS — N179 Acute kidney failure, unspecified: Principal | ICD-10-CM | POA: Diagnosis present

## 2018-09-07 DIAGNOSIS — I10 Essential (primary) hypertension: Secondary | ICD-10-CM | POA: Diagnosis present

## 2018-09-07 DIAGNOSIS — Z8249 Family history of ischemic heart disease and other diseases of the circulatory system: Secondary | ICD-10-CM

## 2018-09-07 DIAGNOSIS — E785 Hyperlipidemia, unspecified: Secondary | ICD-10-CM | POA: Diagnosis not present

## 2018-09-07 DIAGNOSIS — E871 Hypo-osmolality and hyponatremia: Secondary | ICD-10-CM | POA: Diagnosis not present

## 2018-09-07 DIAGNOSIS — N183 Chronic kidney disease, stage 3 (moderate): Secondary | ICD-10-CM | POA: Diagnosis not present

## 2018-09-07 DIAGNOSIS — Z20828 Contact with and (suspected) exposure to other viral communicable diseases: Secondary | ICD-10-CM | POA: Diagnosis present

## 2018-09-07 DIAGNOSIS — Z9181 History of falling: Secondary | ICD-10-CM | POA: Diagnosis not present

## 2018-09-07 DIAGNOSIS — K219 Gastro-esophageal reflux disease without esophagitis: Secondary | ICD-10-CM | POA: Diagnosis not present

## 2018-09-07 DIAGNOSIS — E860 Dehydration: Secondary | ICD-10-CM | POA: Diagnosis present

## 2018-09-07 DIAGNOSIS — Z825 Family history of asthma and other chronic lower respiratory diseases: Secondary | ICD-10-CM

## 2018-09-07 DIAGNOSIS — N049 Nephrotic syndrome with unspecified morphologic changes: Secondary | ICD-10-CM | POA: Diagnosis not present

## 2018-09-07 DIAGNOSIS — E119 Type 2 diabetes mellitus without complications: Secondary | ICD-10-CM

## 2018-09-07 DIAGNOSIS — R0902 Hypoxemia: Secondary | ICD-10-CM | POA: Diagnosis not present

## 2018-09-07 DIAGNOSIS — I130 Hypertensive heart and chronic kidney disease with heart failure and stage 1 through stage 4 chronic kidney disease, or unspecified chronic kidney disease: Secondary | ICD-10-CM | POA: Diagnosis not present

## 2018-09-07 DIAGNOSIS — Z466 Encounter for fitting and adjustment of urinary device: Secondary | ICD-10-CM | POA: Diagnosis not present

## 2018-09-07 DIAGNOSIS — Z7951 Long term (current) use of inhaled steroids: Secondary | ICD-10-CM

## 2018-09-07 LAB — URINALYSIS, ROUTINE W REFLEX MICROSCOPIC
Bilirubin Urine: NEGATIVE
Glucose, UA: NEGATIVE mg/dL
Hgb urine dipstick: NEGATIVE
Ketones, ur: NEGATIVE mg/dL
Nitrite: NEGATIVE
Protein, ur: 30 mg/dL — AB
Specific Gravity, Urine: 1.015 (ref 1.005–1.030)
WBC, UA: >50 WBC/hpf — ABNORMAL HIGH (ref 0–5)
pH: 5 (ref 5.0–8.0)

## 2018-09-07 LAB — CBC WITH DIFFERENTIAL/PLATELET
Abs Immature Granulocytes: 0.04 10*3/uL (ref 0.00–0.07)
Basophils Absolute: 0 10*3/uL (ref 0.0–0.1)
Basophils Relative: 1 %
Eosinophils Absolute: 0.1 10*3/uL (ref 0.0–0.5)
Eosinophils Relative: 1 %
HCT: 39.7 % (ref 39.0–52.0)
Hemoglobin: 13.6 g/dL (ref 13.0–17.0)
Immature Granulocytes: 1 %
Lymphocytes Relative: 21 %
Lymphs Abs: 1.7 10*3/uL (ref 0.7–4.0)
MCH: 30.6 pg (ref 26.0–34.0)
MCHC: 34.3 g/dL (ref 30.0–36.0)
MCV: 89.4 fL (ref 80.0–100.0)
Monocytes Absolute: 0.5 10*3/uL (ref 0.1–1.0)
Monocytes Relative: 6 %
Neutro Abs: 5.8 10*3/uL (ref 1.7–7.7)
Neutrophils Relative %: 70 %
Platelets: 252 10*3/uL (ref 150–400)
RBC: 4.44 MIL/uL (ref 4.22–5.81)
RDW: 14.5 % (ref 11.5–15.5)
WBC: 8.1 10*3/uL (ref 4.0–10.5)
nRBC: 0 % (ref 0.0–0.2)

## 2018-09-07 LAB — COMPREHENSIVE METABOLIC PANEL
ALT: 11 U/L (ref 0–44)
AST: 13 U/L — ABNORMAL LOW (ref 15–41)
Albumin: 4.1 g/dL (ref 3.5–5.0)
Alkaline Phosphatase: 72 U/L (ref 38–126)
Anion gap: 15 (ref 5–15)
BUN: 68 mg/dL — ABNORMAL HIGH (ref 8–23)
CO2: 25 mmol/L (ref 22–32)
Calcium: 9.9 mg/dL (ref 8.9–10.3)
Chloride: 90 mmol/L — ABNORMAL LOW (ref 98–111)
Creatinine, Ser: 2.59 mg/dL — ABNORMAL HIGH (ref 0.61–1.24)
GFR calc Af Amer: 29 mL/min — ABNORMAL LOW (ref >60)
GFR calc non Af Amer: 25 mL/min — ABNORMAL LOW (ref >60)
Glucose, Bld: 313 mg/dL — ABNORMAL HIGH (ref 70–99)
Potassium: 3.8 mmol/L (ref 3.5–5.1)
Sodium: 130 mmol/L — ABNORMAL LOW (ref 135–145)
Total Bilirubin: 0.7 mg/dL (ref 0.3–1.2)
Total Protein: 7.9 g/dL (ref 6.5–8.1)

## 2018-09-07 MED ORDER — SODIUM CHLORIDE 0.9 % IV BOLUS
1000.0000 mL | Freq: Once | INTRAVENOUS | Status: AC
  Administered 2018-09-08: 1000 mL via INTRAVENOUS

## 2018-09-07 MED ORDER — SODIUM CHLORIDE 0.9 % IV SOLN
1.0000 g | Freq: Once | INTRAVENOUS | Status: AC
  Administered 2018-09-08: 1 g via INTRAVENOUS
  Filled 2018-09-07: qty 10

## 2018-09-07 NOTE — ED Triage Notes (Signed)
Pt brought in by [facility] for c/o feeling week; ems reports when they arrived on scene the heat was on in the house and it was 86 degrees in the house; once pt was outside and in the truck pt stated he felt better; [id number];  Pt states he has not felt like eating

## 2018-09-07 NOTE — ED Provider Notes (Signed)
[facility] EMERGENCY DEPARTMENT Provider Note   CSN: [id number] Arrival date & time: 09/07/18  2102    History   Chief Complaint Chief Complaint  Patient presents with  . Weakness    HPI Frank Hoover is a 68 y.o. male.     HPI Patient states he woke this morning and felt hot and generally weak.  He denies any pain.  EMS was called and arrived at the patient's residence.  Patient's heat was on in the house was at 86 degrees.  Patient states that since leaving the house he feels much better.  Continues to deny chest pain, shortness of breath, cough, abdominal pain, nausea, vomiting, diarrhea, fever or chills.  No known trauma.  Patient has Foley catheter in place. Past Medical History:  Diagnosis Date  . Adenomatous colon polyp 10/30/09   Serrated adenoma removed during Colonoscopy   . AICD (automatic cardioverter/defibrillator) present 06/11/2016  . Arthritis    "legs" (09/27/2015)  . Atrial flutter with rapid ventricular response ([place]) 09/27/2015  . CHF (congestive heart failure) ([place])   . CKD (chronic kidney disease) stage 3, GFR 30-59 ml/min (HCC)   . COPD (chronic obstructive pulmonary disease) ([place])   . Diverticulosis of colon   . Dysrhythmia   . Essential hypertension   . Gastroparesis   . GERD (gastroesophageal reflux disease)   . Gout   . Hyperlipemia   . Hypertension   . OA (osteoarthritis)   . Type 2 diabetes mellitus [facility])     Patient Active Problem List   Diagnosis Date Noted  . Severe sepsis ([place]) 06/26/2018  . Obstipation 06/26/2018  . Acute urinary retention 06/26/2018  . Mild bilateral Hydronephrosis 06/26/2018  . CKD (chronic kidney disease) stage 3, GFR 30-59 ml/min (HCC) 06/26/2018  . NICM (nonischemic cardiomyopathy) ([place]) 06/26/2018  . ICD (implantable cardioverter-defibrillator) in place 06/26/2018  . Hypokalemia 06/26/2018  . Hyperglycemia 06/26/2018  . Proctocolitis 06/26/2018  . Lower urinary tract infectious disease 06/26/2018  .  Pressure injury of skin 06/26/2018  . Chronic systolic CHF (congestive heart failure) ([place]) 06/11/2016  . Diarrhea 04/18/2016  . Anemia 10/17/2015  . Nausea and vomiting 10/17/2015  . Atrial flutter ([place]) 09/27/2015  . Pressure ulcer 09/05/2015  . Abdominal pain   . Atrial fibrillation with RVR ([place])   . HCAP (healthcare-associated pneumonia)   . Acute renal failure superimposed on stage 3 chronic kidney disease ([place])   . Metabolic encephalopathy   . Abdominal distension   . Urinary tract infection, site not specified   . Cardiogenic shock ([place])   . Acute systolic CHF (congestive heart failure) ([place])   . Uncontrolled type 2 diabetes mellitus with complication ([place])   . Urinary retention   . Bacteremia   . Ischemic colitis ([place])   . Noninfectious gastroenteritis, unspecified   . Proctosigmoiditis   . Lower GI bleed   . SVT (supraventricular tachycardia) ([place]) 08/24/2015  . Debility   . Blood in stool   . Acute blood loss anemia   . Abnormal CT scan, colon   . Acute systolic heart failure ([place])   . Acute kidney injury ([place])   . Acute respiratory failure ([place])   . Septic shock ([place])   . Acute encephalopathy   . Atrial flutter with rapid ventricular response ([place]) 08/14/2015  . Systolic CHF ([place]) 72/53/6644  . History of colonic polyps   . Dehydration 12/05/2013  . Acute renal failure ([place]) 12/02/2013  . Sleep apnea 12/02/2013  . Hyponatremia 12/02/2013  .  Acute on chronic renal failure ([place]) 12/02/2013  . COPD (chronic obstructive pulmonary disease) ([place])   . DM (diabetes mellitus) ([place])   . HTN (hypertension)   . Hyperlipemia   . CRF (chronic renal failure)   . Hepatomegaly 03/25/2013  . Dysphagia 12/06/2010  . History of adenomatous polyp of colon 09/21/2010  . Gastroparesis 09/21/2010  . Obesity 09/21/2010  . GERD (gastroesophageal reflux disease) 10/09/2009  . Constipation 10/09/2009  . ABDOMINAL PAIN-EPIGASTRIC 10/09/2009    Past Surgical History:  Procedure  Laterality Date  . CARDIOVERSION N/A 08/22/2015   Procedure: CARDIOVERSION;  Surgeon: Larey Dresser, MD;  Location: [place];  Service: Cardiovascular;  Laterality: N/A;  . CARDIOVERSION N/A 09/01/2015   Procedure: CARDIOVERSION;  Surgeon: Lelon Perla, MD;  Location: [place];  Service: Cardiovascular;  Laterality: N/A;  . CATARACT EXTRACTION W/ INTRAOCULAR LENS  IMPLANT, BILATERAL Bilateral   . COLONOSCOPY  10/30/09   EVO:[id number] diverticulum/serrated adenoma from ICV, next colonoscopy due 10/2012  . COLONOSCOPY N/A 09/18/2012   EXH:BZJIRCV mucosa that was seen appeared normal, however most of it was not seen due to be very poor prep  . COLONOSCOPY N/A 04/08/2013   ELF:YBOFBPZ polyp removed/inadequate preparation  . COLONOSCOPY N/A 06/22/2014   Procedure: COLONOSCOPY;  Surgeon: Daneil Dolin, MD;  [facility];  Service: Endoscopy;  Laterality: N/A;  115   . ELECTROPHYSIOLOGIC STUDY N/A 09/27/2015   Procedure: SVT Ablation;  Surgeon: [place] Lance, MD;  Location: [place] CV LAB;  Service: Cardiovascular;  Laterality: N/A;  . EP IMPLANTABLE DEVICE  06/11/2016  . ESOPHAGOGASTRODUODENOSCOPY  10/30/09   [facility]:ENIDPO   . FLEXIBLE SIGMOIDOSCOPY N/A 08/25/2015   Procedure: FLEXIBLE SIGMOIDOSCOPY;  Surgeon: Jerene Bears, MD;  Location: [facility] ENDOSCOPY;  Service: Endoscopy;  Laterality: N/A;  . ICD IMPLANT N/A 06/11/2016   Procedure: ICD Implant;  Surgeon: [place] Lance, MD;  Location: [place] CV LAB;  Service: Cardiovascular;  Laterality: N/A;  . TEE WITHOUT CARDIOVERSION N/A 08/22/2015   Procedure: TRANSESOPHAGEAL ECHOCARDIOGRAM (TEE);  Surgeon: Larey Dresser, MD;  Location: [place];  Service: Cardiovascular;  Laterality: N/A;  . TEE WITHOUT CARDIOVERSION  09/27/2015   Procedure: Transesophageal Echocardiogram (Tee);  Surgeon: [place] Lance, MD;  Location: [place] CV LAB;  Service: Cardiovascular;;  . TONSILLECTOMY  1950s/1960s        Home Medications     Prior to Admission medications   Medication Sig Start Date End Date Taking? Authorizing Provider  acetaminophen (TYLENOL) 500 MG tablet Take 1,000 mg by mouth daily as needed for moderate pain or headache.     [provider]  albuterol (PROAIR HFA) 108 (90 BASE) MCG/ACT inhaler Inhale 2 puffs into the lungs every 6 (six) hours as needed for wheezing or shortness of breath.     [provider]  allopurinol (ZYLOPRIM) 300 MG tablet Take 300 mg by mouth daily.      [provider]  alum & mag hydroxide-simeth (MAALOX/MYLANTA) 200-200-20 MG/5ML suspension Take 15 mLs by mouth every 6 (six) hours as needed for indigestion or heartburn.    [provider]  atorvastatin (LIPITOR) 20 MG tablet Take 20 mg by mouth at bedtime.    [provider]  cefdinir (OMNICEF) 300 MG capsule Take 1 capsule (300 mg total) by mouth 2 (two) times daily. 08/02/18   Sinda Du, MD  cholecalciferol (VITAMIN D) 1000 units tablet Take 1,000 Units by mouth daily.    [provider]  fluticasone (FLONASE) 50 MCG/ACT nasal spray Place 2 sprays into both nostrils daily.  05/29/18   [provider]  furosemide (LASIX) 40 MG tablet Take 40 mg by mouth every morning. & 20 mg in the evening    [provider]  gabapentin (NEURONTIN) 400 MG capsule Take 400 mg by mouth daily.     [provider]  hydrALAZINE (APRESOLINE) 100 MG tablet Take 1 tablet (100 mg total) by mouth 3 (three) times daily. 07/20/18   Arnoldo Lenis, MD  HYDROcodone-acetaminophen (NORCO/VICODIN) 5-325 MG tablet Take 1 tablet by mouth every 6 (six) hours as needed. 07/01/18   [provider]  insulin lispro (HUMALOG KWIKPEN) 100 UNIT/ML KiwkPen Inject 18-20 Units into the skin as directed. Inject 2 to 18 units based on sliding scale as directed as needed for blood glucose over 200    [provider]  isosorbide mononitrate (IMDUR) 30 MG 24 hr tablet TAKE 1 TABLET  DAILY 07/31/18   [place] Lance, MD  LANTUS SOLOSTAR 100 UNIT/ML Solostar Pen Inject 20 Units into the skin daily.  06/23/18   [provider]  linaclotide Rolan Lipa) 145 MCG CAPS capsule Take 1 capsule (145 mcg total) by mouth daily before breakfast. 07/02/18   Sinda Du, MD  loratadine (CLARITIN) 10 MG tablet Take 10 mg by mouth daily.    [provider]  metoCLOPramide (REGLAN) 5 MG tablet Take 5 mg by mouth 3 (three) times daily before meals.    [provider]  metoprolol succinate (TOPROL-XL) 100 MG 24 hr tablet Take 1 tablet (100 mg total) by mouth 2 (two) times a day. Take with or immediately following a meal. 08/12/18 11/10/18  Arnoldo Lenis, MD  Multiple Vitamin (MULTIVITAMIN WITH MINERALS) TABS tablet Take 1 tablet by mouth daily. 09/07/15   Hosie Poisson, MD  ondansetron (ZOFRAN) 4 MG tablet Take 1 tablet (4 mg total) by mouth every 8 (eight) hours as needed for nausea or vomiting. 10/17/15   Carlis Stable, NP  pantoprazole (PROTONIX) 40 MG tablet Take 1 tablet (40 mg total) by mouth daily. 09/07/15   Hosie Poisson, MD  potassium chloride SA (K-DUR) 20 MEQ tablet Take 1 tablet (20 mEq total) by mouth 2 (two) times daily. 07/01/18   Sinda Du, MD  tamsulosin (FLOMAX) 0.4 MG CAPS capsule Take 1 capsule by mouth daily. 07/02/18   [provider]  traZODone (DESYREL) 50 MG tablet Take 0.5 tablets (25 mg total) by mouth at bedtime as needed for sleep. 07/01/18   Sinda Du, MD  vitamin C (ASCORBIC ACID) 500 MG tablet Take 500 mg by mouth 2 (two) times daily.    [provider]    Family History Family History  Problem Relation Age of Onset  . COPD Father   . Diabetes Mother   . Hypertension Mother   . Colon cancer Neg Hx     Social History Social History   Tobacco Use  . Smoking status: Former Smoker    Packs/day: 1.00    Years: 4.00    Pack years: 4.00    Types: Cigarettes    Quit date: 08/26/2010    Years since quitting:  8.0  . Smokeless tobacco: Never Used  Substance Use Topics  . Alcohol use: No    Alcohol/week: 0.0 standard drinks    Comment: "stopped in 2012 when I quit smoking"  . Drug use: No     Allergies   Patient has no known allergies.  Review of Systems Review of Systems  Constitutional: Positive for fatigue. Negative for chills and fever.  HENT: Negative for sore throat and trouble swallowing.   Eyes: Negative for visual disturbance.  Respiratory: Negative for cough and shortness of breath.   Cardiovascular: Negative for chest pain.  Gastrointestinal: Negative for abdominal pain, constipation, diarrhea, nausea and vomiting.  Genitourinary: Negative for dysuria, flank pain and hematuria.  Musculoskeletal: Negative for back pain, myalgias and neck pain.  Skin: Negative for rash and wound.  Neurological: Positive for weakness. Negative for dizziness, light-headedness, numbness and headaches.  All other systems reviewed and are negative.    Physical Exam Updated Vital Signs BP (!) 139/99   Pulse 76   Temp 98.3 F (36.8 C) (Oral)   Resp 16   Ht 5\' 6"  (1.676 m)   Wt 90.7 kg   SpO2 99%   BMI 32.28 kg/m   Physical Exam Vitals signs and nursing note reviewed.  Constitutional:      Appearance: He is well-developed.     Comments: Chronically ill-appearing  HENT:     Head: Normocephalic and atraumatic.     Nose: Nose normal.     Mouth/Throat:     Mouth: Mucous membranes are moist.  Eyes:     Extraocular Movements: Extraocular movements intact.     Pupils: Pupils are equal, round, and reactive to light.  Neck:     Musculoskeletal: Normal range of motion and neck supple. No neck rigidity or muscular tenderness.  Cardiovascular:     Rate and Rhythm: Normal rate and regular rhythm.     Heart sounds: No murmur. No friction rub. No gallop.   Pulmonary:     Effort: Pulmonary effort is normal. No respiratory distress.     Breath sounds: Normal breath sounds. No stridor. No  wheezing, rhonchi or rales.  Chest:     Chest wall: No tenderness.  Abdominal:     General: Bowel sounds are normal. There is no distension.     Palpations: Abdomen is soft.     Tenderness: There is no abdominal tenderness. There is no guarding or rebound.  Genitourinary:    Comments: Foley catheter in place draining cloudy urine. Musculoskeletal: Normal range of motion.        General: No swelling, tenderness, deformity or signs of injury.     Right lower leg: No edema.  Lymphadenopathy:     Cervical: No cervical adenopathy.  Skin:    General: Skin is warm and dry.     Findings: No erythema or rash.  Neurological:     Mental Status: He is alert and oriented to person, place, and time.     Comments: Mild generalized weakness without focality.  Sensation intact.  Psychiatric:        Behavior: Behavior normal.      ED Treatments / Results  Labs (all labs ordered are listed, but only abnormal results are displayed) Labs Reviewed  CBC WITH DIFFERENTIAL/PLATELET  COMPREHENSIVE METABOLIC PANEL  URINALYSIS, ROUTINE W REFLEX MICROSCOPIC    EKG EKG Interpretation  Date/Time:  Monday September 07 2018 22:34:55 EDT Ventricular Rate:  77 PR Interval:    QRS Duration: 109 QT Interval:  398 QTC Calculation: 451 R Axis:   -30 Text Interpretation:  Sinus rhythm Prolonged PR interval Inferior infarct, old Consider anterior infarct Lateral leads are also involved Confirmed by Julianne Rice [phone]) on 09/07/2018 10:45:36 PM   Radiology No results found.  Procedures Procedures (including critical care time)  Medications  Ordered in ED Medications - No data to display   Initial Impression / Assessment and Plan / ED Course  I have reviewed the triage vital signs and the nursing notes.  Pertinent labs & imaging results that were available during my care of the patient were reviewed by me and considered in my medical decision making (see chart for details).        Will screen  with labs and urine.  Vital signs are stable.  Signed out to oncoming emergency physician pending completion of work-up.  Final Clinical Impressions(s) / ED Diagnoses   Final diagnoses:  Generalized weakness    ED Discharge Orders    None       Julianne Rice, MD 09/07/18 2245

## 2018-09-08 ENCOUNTER — Observation Stay (HOSPITAL_COMMUNITY): Payer: Medicare Other

## 2018-09-08 DIAGNOSIS — N179 Acute kidney failure, unspecified: Secondary | ICD-10-CM | POA: Diagnosis not present

## 2018-09-08 DIAGNOSIS — E1122 Type 2 diabetes mellitus with diabetic chronic kidney disease: Secondary | ICD-10-CM | POA: Diagnosis not present

## 2018-09-08 DIAGNOSIS — M109 Gout, unspecified: Secondary | ICD-10-CM | POA: Diagnosis present

## 2018-09-08 DIAGNOSIS — M6281 Muscle weakness (generalized): Secondary | ICD-10-CM | POA: Diagnosis not present

## 2018-09-08 DIAGNOSIS — Z20828 Contact with and (suspected) exposure to other viral communicable diseases: Secondary | ICD-10-CM | POA: Diagnosis present

## 2018-09-08 DIAGNOSIS — Z7401 Bed confinement status: Secondary | ICD-10-CM | POA: Diagnosis not present

## 2018-09-08 DIAGNOSIS — I428 Other cardiomyopathies: Secondary | ICD-10-CM | POA: Diagnosis not present

## 2018-09-08 DIAGNOSIS — I480 Paroxysmal atrial fibrillation: Secondary | ICD-10-CM | POA: Diagnosis present

## 2018-09-08 DIAGNOSIS — R262 Difficulty in walking, not elsewhere classified: Secondary | ICD-10-CM | POA: Diagnosis not present

## 2018-09-08 DIAGNOSIS — M1A9XX0 Chronic gout, unspecified, without tophus (tophi): Secondary | ICD-10-CM | POA: Diagnosis not present

## 2018-09-08 DIAGNOSIS — N400 Enlarged prostate without lower urinary tract symptoms: Secondary | ICD-10-CM | POA: Diagnosis present

## 2018-09-08 DIAGNOSIS — E860 Dehydration: Secondary | ICD-10-CM | POA: Diagnosis present

## 2018-09-08 DIAGNOSIS — I5022 Chronic systolic (congestive) heart failure: Secondary | ICD-10-CM

## 2018-09-08 DIAGNOSIS — E871 Hypo-osmolality and hyponatremia: Secondary | ICD-10-CM | POA: Diagnosis present

## 2018-09-08 DIAGNOSIS — N390 Urinary tract infection, site not specified: Secondary | ICD-10-CM | POA: Diagnosis not present

## 2018-09-08 DIAGNOSIS — J449 Chronic obstructive pulmonary disease, unspecified: Secondary | ICD-10-CM | POA: Diagnosis not present

## 2018-09-08 DIAGNOSIS — E1143 Type 2 diabetes mellitus with diabetic autonomic (poly)neuropathy: Secondary | ICD-10-CM | POA: Diagnosis present

## 2018-09-08 DIAGNOSIS — K3184 Gastroparesis: Secondary | ICD-10-CM | POA: Diagnosis present

## 2018-09-08 DIAGNOSIS — G4700 Insomnia, unspecified: Secondary | ICD-10-CM | POA: Diagnosis present

## 2018-09-08 DIAGNOSIS — Z794 Long term (current) use of insulin: Secondary | ICD-10-CM

## 2018-09-08 DIAGNOSIS — I10 Essential (primary) hypertension: Secondary | ICD-10-CM | POA: Diagnosis not present

## 2018-09-08 DIAGNOSIS — E1340 Other specified diabetes mellitus with diabetic neuropathy, unspecified: Secondary | ICD-10-CM | POA: Diagnosis not present

## 2018-09-08 DIAGNOSIS — R531 Weakness: Secondary | ICD-10-CM | POA: Diagnosis present

## 2018-09-08 DIAGNOSIS — R339 Retention of urine, unspecified: Secondary | ICD-10-CM | POA: Diagnosis not present

## 2018-09-08 DIAGNOSIS — K5900 Constipation, unspecified: Secondary | ICD-10-CM | POA: Diagnosis present

## 2018-09-08 DIAGNOSIS — Z95810 Presence of automatic (implantable) cardiac defibrillator: Secondary | ICD-10-CM | POA: Diagnosis not present

## 2018-09-08 DIAGNOSIS — I130 Hypertensive heart and chronic kidney disease with heart failure and stage 1 through stage 4 chronic kidney disease, or unspecified chronic kidney disease: Secondary | ICD-10-CM | POA: Diagnosis present

## 2018-09-08 DIAGNOSIS — Z1612 Extended spectrum beta lactamase (ESBL) resistance: Secondary | ICD-10-CM | POA: Diagnosis not present

## 2018-09-08 DIAGNOSIS — Z87891 Personal history of nicotine dependence: Secondary | ICD-10-CM | POA: Diagnosis not present

## 2018-09-08 DIAGNOSIS — N183 Chronic kidney disease, stage 3 (moderate): Secondary | ICD-10-CM | POA: Diagnosis not present

## 2018-09-08 DIAGNOSIS — Z79899 Other long term (current) drug therapy: Secondary | ICD-10-CM | POA: Diagnosis not present

## 2018-09-08 DIAGNOSIS — Z86010 Personal history of colonic polyps: Secondary | ICD-10-CM | POA: Diagnosis not present

## 2018-09-08 DIAGNOSIS — L8990 Pressure ulcer of unspecified site, unspecified stage: Secondary | ICD-10-CM | POA: Diagnosis not present

## 2018-09-08 DIAGNOSIS — R5381 Other malaise: Secondary | ICD-10-CM | POA: Diagnosis not present

## 2018-09-08 DIAGNOSIS — E785 Hyperlipidemia, unspecified: Secondary | ICD-10-CM | POA: Diagnosis not present

## 2018-09-08 DIAGNOSIS — K219 Gastro-esophageal reflux disease without esophagitis: Secondary | ICD-10-CM | POA: Diagnosis not present

## 2018-09-08 DIAGNOSIS — E119 Type 2 diabetes mellitus without complications: Secondary | ICD-10-CM | POA: Diagnosis not present

## 2018-09-08 LAB — COMPREHENSIVE METABOLIC PANEL
ALT: 9 U/L (ref 0–44)
AST: 12 U/L — ABNORMAL LOW (ref 15–41)
Albumin: 3.8 g/dL (ref 3.5–5.0)
Alkaline Phosphatase: 64 U/L (ref 38–126)
Anion gap: 10 (ref 5–15)
BUN: 60 mg/dL — ABNORMAL HIGH (ref 8–23)
CO2: 26 mmol/L (ref 22–32)
Calcium: 9.5 mg/dL (ref 8.9–10.3)
Chloride: 99 mmol/L (ref 98–111)
Creatinine, Ser: 2.29 mg/dL — ABNORMAL HIGH (ref 0.61–1.24)
GFR calc Af Amer: 33 mL/min — ABNORMAL LOW (ref >60)
GFR calc non Af Amer: 29 mL/min — ABNORMAL LOW (ref >60)
Glucose, Bld: 255 mg/dL — ABNORMAL HIGH (ref 70–99)
Potassium: 3.3 mmol/L — ABNORMAL LOW (ref 3.5–5.1)
Sodium: 135 mmol/L (ref 135–145)
Total Bilirubin: 0.5 mg/dL (ref 0.3–1.2)
Total Protein: 6.9 g/dL (ref 6.5–8.1)

## 2018-09-08 LAB — CBC
HCT: 36.3 % — ABNORMAL LOW (ref 39.0–52.0)
Hemoglobin: 12.2 g/dL — ABNORMAL LOW (ref 13.0–17.0)
MCH: 30.2 pg (ref 26.0–34.0)
MCHC: 33.6 g/dL (ref 30.0–36.0)
MCV: 89.9 fL (ref 80.0–100.0)
Platelets: 251 10*3/uL (ref 150–400)
RBC: 4.04 MIL/uL — ABNORMAL LOW (ref 4.22–5.81)
RDW: 14.3 % (ref 11.5–15.5)
WBC: 7.3 10*3/uL (ref 4.0–10.5)
nRBC: 0 % (ref 0.0–0.2)

## 2018-09-08 LAB — TSH: TSH: 64.228 u[IU]/mL — ABNORMAL HIGH (ref 0.350–4.500)

## 2018-09-08 LAB — GLUCOSE, CAPILLARY
Glucose-Capillary: 232 mg/dL — ABNORMAL HIGH (ref 70–99)
Glucose-Capillary: 266 mg/dL — ABNORMAL HIGH (ref 70–99)
Glucose-Capillary: 228 mg/dL — ABNORMAL HIGH (ref 70–99)
Glucose-Capillary: 237 mg/dL — ABNORMAL HIGH (ref 70–99)

## 2018-09-08 LAB — SARS CORONAVIRUS 2 BY RT PCR (HOSPITAL ORDER, PERFORMED IN ~~LOC~~ HOSPITAL LAB): SARS Coronavirus 2: NEGATIVE

## 2018-09-08 LAB — OSMOLALITY: Osmolality: 310 mOsm/kg — ABNORMAL HIGH (ref 275–295)

## 2018-09-08 LAB — CORTISOL: Cortisol, Plasma: 7.2 ug/dL

## 2018-09-08 MED ORDER — METOCLOPRAMIDE HCL 10 MG PO TABS
5.0000 mg | ORAL_TABLET | Freq: Three times a day (TID) | ORAL | Status: DC
  Administered 2018-09-08 – 2018-09-12 (×13): 5 mg via ORAL
  Filled 2018-09-08 (×14): qty 1

## 2018-09-08 MED ORDER — INSULIN ASPART 100 UNIT/ML ~~LOC~~ SOLN
0.0000 [IU] | Freq: Three times a day (TID) | SUBCUTANEOUS | Status: DC
  Administered 2018-09-08: 3 [IU] via SUBCUTANEOUS
  Administered 2018-09-08: 5 [IU] via SUBCUTANEOUS
  Administered 2018-09-08: 3 [IU] via SUBCUTANEOUS
  Administered 2018-09-09 (×2): 5 [IU] via SUBCUTANEOUS
  Administered 2018-09-09: 3 [IU] via SUBCUTANEOUS

## 2018-09-08 MED ORDER — SODIUM CHLORIDE 0.9 % IV SOLN
1.0000 g | INTRAVENOUS | Status: DC
  Administered 2018-09-08 – 2018-09-09 (×2): 1 g via INTRAVENOUS
  Filled 2018-09-08 (×2): qty 10

## 2018-09-08 MED ORDER — ALLOPURINOL 300 MG PO TABS
300.0000 mg | ORAL_TABLET | Freq: Every day | ORAL | Status: DC
  Administered 2018-09-08 – 2018-09-12 (×5): 300 mg via ORAL
  Filled 2018-09-08 (×5): qty 1

## 2018-09-08 MED ORDER — INSULIN ASPART 100 UNIT/ML ~~LOC~~ SOLN
0.0000 [IU] | Freq: Every day | SUBCUTANEOUS | Status: DC
  Administered 2018-09-08: 2 [IU] via SUBCUTANEOUS
  Administered 2018-09-09: 3 [IU] via SUBCUTANEOUS

## 2018-09-08 MED ORDER — FUROSEMIDE 40 MG PO TABS
40.0000 mg | ORAL_TABLET | ORAL | Status: DC
  Administered 2018-09-08 – 2018-09-09 (×2): 40 mg via ORAL
  Filled 2018-09-08 (×4): qty 1

## 2018-09-08 MED ORDER — ATORVASTATIN CALCIUM 20 MG PO TABS
20.0000 mg | ORAL_TABLET | Freq: Every day | ORAL | Status: DC
  Administered 2018-09-08 – 2018-09-11 (×4): 20 mg via ORAL
  Filled 2018-09-08 (×5): qty 1

## 2018-09-08 MED ORDER — ACETAMINOPHEN 650 MG RE SUPP: 650.0000 mg | Freq: Four times a day (QID) | RECTAL | Status: DC | PRN

## 2018-09-08 MED ORDER — LINACLOTIDE 145 MCG PO CAPS
145.0000 ug | ORAL_CAPSULE | Freq: Every day | ORAL | Status: DC
  Administered 2018-09-08 – 2018-09-12 (×5): 145 ug via ORAL
  Filled 2018-09-08 (×5): qty 1

## 2018-09-08 MED ORDER — HEPARIN SODIUM (PORCINE) 5000 UNIT/ML IJ SOLN
5000.0000 [IU] | Freq: Three times a day (TID) | INTRAMUSCULAR | Status: DC
  Administered 2018-09-08 – 2018-09-12 (×13): 5000 [IU] via SUBCUTANEOUS
  Filled 2018-09-08 (×14): qty 1

## 2018-09-08 MED ORDER — ONDANSETRON HCL 4 MG PO TABS: 4.0000 mg | ORAL_TABLET | Freq: Three times a day (TID) | ORAL | Status: DC | PRN

## 2018-09-08 MED ORDER — POTASSIUM CHLORIDE CRYS ER 20 MEQ PO TBCR
20.0000 meq | EXTENDED_RELEASE_TABLET | Freq: Two times a day (BID) | ORAL | Status: DC
  Administered 2018-09-08 – 2018-09-12 (×9): 20 meq via ORAL
  Filled 2018-09-08 (×10): qty 1

## 2018-09-08 MED ORDER — HYDRALAZINE HCL 25 MG PO TABS
100.0000 mg | ORAL_TABLET | Freq: Three times a day (TID) | ORAL | Status: DC
  Administered 2018-09-08 – 2018-09-12 (×13): 100 mg via ORAL
  Filled 2018-09-08 (×15): qty 4

## 2018-09-08 MED ORDER — ALBUTEROL SULFATE (2.5 MG/3ML) 0.083% IN NEBU: 2.5000 mg | INHALATION_SOLUTION | Freq: Four times a day (QID) | RESPIRATORY_TRACT | Status: DC | PRN

## 2018-09-08 MED ORDER — LORATADINE 10 MG PO TABS
10.0000 mg | ORAL_TABLET | Freq: Every day | ORAL | Status: DC
  Administered 2018-09-08 – 2018-09-12 (×5): 10 mg via ORAL
  Filled 2018-09-08 (×5): qty 1

## 2018-09-08 MED ORDER — METOPROLOL SUCCINATE ER 50 MG PO TB24
100.0000 mg | ORAL_TABLET | Freq: Two times a day (BID) | ORAL | Status: DC
  Administered 2018-09-08 – 2018-09-12 (×9): 100 mg via ORAL
  Filled 2018-09-08 (×10): qty 2

## 2018-09-08 MED ORDER — SODIUM CHLORIDE 0.9 % IV SOLN
INTRAVENOUS | Status: AC
  Administered 2018-09-08: 03:00:00 via INTRAVENOUS

## 2018-09-08 MED ORDER — TRAZODONE HCL 50 MG PO TABS: 25.0000 mg | ORAL_TABLET | Freq: Every evening | ORAL | Status: DC | PRN

## 2018-09-08 MED ORDER — ISOSORBIDE MONONITRATE ER 60 MG PO TB24
30.0000 mg | ORAL_TABLET | Freq: Every day | ORAL | Status: DC
  Administered 2018-09-08 – 2018-09-12 (×5): 30 mg via ORAL
  Filled 2018-09-08 (×5): qty 1

## 2018-09-08 MED ORDER — HYDROCODONE-ACETAMINOPHEN 5-325 MG PO TABS
1.0000 | ORAL_TABLET | Freq: Four times a day (QID) | ORAL | Status: DC | PRN
  Administered 2018-09-08: 23:00:00 1 via ORAL
  Filled 2018-09-08 (×2): qty 1

## 2018-09-08 MED ORDER — VITAMIN D 25 MCG (1000 UNIT) PO TABS
1000.0000 [IU] | ORAL_TABLET | Freq: Every day | ORAL | Status: DC
  Administered 2018-09-08 – 2018-09-12 (×5): 1000 [IU] via ORAL
  Filled 2018-09-08 (×5): qty 1

## 2018-09-08 MED ORDER — TAMSULOSIN HCL 0.4 MG PO CAPS
0.4000 mg | ORAL_CAPSULE | Freq: Every day | ORAL | Status: DC
  Administered 2018-09-08 – 2018-09-12 (×5): 0.4 mg via ORAL
  Filled 2018-09-08 (×5): qty 1

## 2018-09-08 MED ORDER — ACETAMINOPHEN 325 MG PO TABS: 650.0000 mg | ORAL_TABLET | Freq: Four times a day (QID) | ORAL | Status: DC | PRN

## 2018-09-08 MED ORDER — ADULT MULTIVITAMIN W/MINERALS CH
1.0000 | ORAL_TABLET | Freq: Every day | ORAL | Status: DC
  Administered 2018-09-08 – 2018-09-12 (×5): 1 via ORAL
  Filled 2018-09-08 (×5): qty 1

## 2018-09-08 MED ORDER — INSULIN GLARGINE 100 UNIT/ML ~~LOC~~ SOLN
20.0000 [IU] | Freq: Every day | SUBCUTANEOUS | Status: DC
  Administered 2018-09-08 – 2018-09-09 (×2): 20 [IU] via SUBCUTANEOUS
  Filled 2018-09-08 (×4): qty 0.2

## 2018-09-08 MED ORDER — GABAPENTIN 400 MG PO CAPS
400.0000 mg | ORAL_CAPSULE | Freq: Every day | ORAL | Status: DC
  Administered 2018-09-08 – 2018-09-12 (×5): 400 mg via ORAL
  Filled 2018-09-08 (×5): qty 1

## 2018-09-08 MED ORDER — VITAMIN C 500 MG PO TABS
500.0000 mg | ORAL_TABLET | Freq: Two times a day (BID) | ORAL | Status: DC
  Administered 2018-09-08 – 2018-09-12 (×9): 500 mg via ORAL
  Filled 2018-09-08 (×10): qty 1

## 2018-09-08 MED ORDER — FLUTICASONE PROPIONATE 50 MCG/ACT NA SUSP
2.0000 | Freq: Every day | NASAL | Status: DC
  Administered 2018-09-08 – 2018-09-12 (×5): 2 via NASAL
  Filled 2018-09-08: qty 16

## 2018-09-08 MED ORDER — PANTOPRAZOLE SODIUM 40 MG PO TBEC
40.0000 mg | DELAYED_RELEASE_TABLET | Freq: Every day | ORAL | Status: DC
  Administered 2018-09-08 – 2018-09-12 (×6): 40 mg via ORAL
  Filled 2018-09-08 (×5): qty 1

## 2018-09-08 NOTE — Progress Notes (Signed)
This is an assumption of care note.  He was admitted with acute renal failure.  He has been in and out of the hospital on multiple occasions in the last several months.  It has been recommended that he go to a skilled care facility but he has refused.  There appears to be substantial conflict in the family.  He is sleepy this morning.  Social services is involved.  He is receiving IV fluids.  In addition to his acute renal failure he does have heart failure so we have to be careful with his IV fluids so I agree with fluid rate.  He has had trouble with urinary retention.  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

## 2018-09-08 NOTE — H&P (Addendum)
TRH H&P    Patient Demographics:    Frank Hoover, is a 68 y.o. male  MRN: [id number]  DOB - 1950-08-21  Admit Date - 09/07/2018  Referring MD/NP/PA: Delora Fuel  Outpatient Primary MD for the patient is Sinda Du, MD Cristopher Peru - EP  Patient coming from: home  Chief complaint- generalized weakness, UTI   HPI:    Frank Hoover  is a 68 y.o. 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  Slight nausea.   Pt denies fever, chills, cp, palp, sob beyond his baseline, emesis, abd pain, diarrhea, brbpr, dysuria, hematuria, focal neurological weakness.    In ED,  T 98.3, P 84 R 20 Bp 120/77 pox 90  CXR IMPRESSION: No active disease.  Urinalysis wbc >50,  Rbc 11-20  Wbc 8.1, Hgb 13.6, Plt 252 Na 130, K 3.8 Bun 68, Creatinine 2.59 (baseline 1.31-2.07) Glucose 313 Ast 13, Alt 11, Alk phos 72, T. Bili 0.7  covid 19 negative  Pt will be admitted for generalized weakness secondary to UTI, and mild ARF on CKD stage3.       Review of systems:    In addition to the HPI above,  No Fever-chills, No Headache, No changes with Vision or hearing, No problems swallowing food or Liquids, No Chest pain, Cough or Shortness of Breath, No Abdominal pain, No Nausea or Vomiting, bowel movements are regular, No Blood in stool or Urine, No dysuria, No new skin rashes or bruises, No new joints pains-aches,   No recent weight gain or loss, No polyuria, polydypsia or polyphagia, No significant Mental Stressors.  All other systems reviewed and are negative.    Past History of the following :    Past Medical History:  Diagnosis Date  . Adenomatous  colon polyp 10/30/09   Serrated adenoma removed during Colonoscopy   . AICD (automatic cardioverter/defibrillator) present 06/11/2016  . Arthritis    "legs" (09/27/2015)  . Atrial flutter with rapid ventricular response ([place]) 09/27/2015  . CHF (congestive heart failure) ([place])   . CKD (chronic kidney disease) stage 3, GFR 30-59 ml/min (HCC)   . COPD (chronic obstructive pulmonary disease) ([place])   . Diverticulosis of colon   . Dysrhythmia   . Essential hypertension   . Gastroparesis   . GERD (gastroesophageal reflux disease)   . Gout   . Hyperlipemia   . Hypertension   . OA (osteoarthritis)   . Type 2 diabetes mellitus ([place])       Past Surgical History:  Procedure Laterality Date  . CARDIOVERSION N/A 08/22/2015   Procedure: CARDIOVERSION;  Surgeon: Larey Dresser, MD;  Location: [place];  Service: Cardiovascular;  Laterality: N/A;  . CARDIOVERSION N/A 09/01/2015   Procedure: CARDIOVERSION;  Surgeon: Lelon Perla, MD;  Location: [place];  Service: Cardiovascular;  Laterality:  N/A;  . CATARACT EXTRACTION W/ INTRAOCULAR LENS  IMPLANT, BILATERAL Bilateral   . COLONOSCOPY  10/30/09   KMQ:KMMN-OTRRN diverticulum/serrated adenoma from ICV, next colonoscopy due 10/2012  . COLONOSCOPY N/A 09/18/2012   HAF:BXUXYBF mucosa that was seen appeared normal, however most of it was not seen due to be very poor prep  . COLONOSCOPY N/A 04/08/2013   XOV:ANVBTYO polyp removed/inadequate preparation  . COLONOSCOPY N/A 06/22/2014   Procedure: COLONOSCOPY;  Surgeon: Daneil Dolin, MD;  [facility];  Service: Endoscopy;  Laterality: N/A;  115   . ELECTROPHYSIOLOGIC STUDY N/A 09/27/2015   Procedure: SVT Ablation;  Surgeon: Evans Lance, MD;  Location: [place] [facility];  Service: Cardiovascular;  Laterality: N/A;  . EP IMPLANTABLE DEVICE  06/11/2016  . ESOPHAGOGASTRODUODENOSCOPY  10/30/09   MAY:OKHTXH   . FLEXIBLE SIGMOIDOSCOPY N/A 08/25/2015   Procedure: FLEXIBLE SIGMOIDOSCOPY;   Surgeon: Jerene Bears, MD;  Location: [facility] ENDOSCOPY;  Service: Endoscopy;  Laterality: N/A;  . ICD IMPLANT N/A 06/11/2016   Procedure: ICD Implant;  Surgeon: Evans Lance, MD;  Location: [place] CV LAB;  Service: Cardiovascular;  Laterality: N/A;  . TEE WITHOUT CARDIOVERSION N/A 08/22/2015   Procedure: TRANSESOPHAGEAL ECHOCARDIOGRAM (TEE);  Surgeon: Larey Dresser, MD;  Location: [place];  Service: Cardiovascular;  Laterality: N/A;  . TEE WITHOUT CARDIOVERSION  09/27/2015   Procedure: Transesophageal Echocardiogram (Tee);  Surgeon: Evans Lance, MD;  Location: [place] CV LAB;  Service: Cardiovascular;;  . TONSILLECTOMY  1950s/1960s      Social History:      Social History   Tobacco Use  . Smoking status: Former Smoker    Packs/day: 1.00    Years: 4.00    Pack years: 4.00    Types: Cigarettes    Quit date: 08/26/2010    Years since quitting: 8.0  . Smokeless tobacco: Never Used  Substance Use Topics  . Alcohol use: No    Alcohol/week: 0.0 standard drinks    Comment: "stopped in 2012 when I quit smoking"       Family History :     Family History  Problem Relation Age of Onset  . COPD Father   . Diabetes Mother   . Hypertension Mother   . Colon cancer Neg Hx        Home Medications:   Prior to Admission medications   Medication Sig Start Date End Date Taking? Authorizing Provider  acetaminophen (TYLENOL) 500 MG tablet Take 1,000 mg by mouth daily as needed for moderate pain or headache.     [provider]  albuterol (PROAIR HFA) 108 (90 BASE) MCG/ACT inhaler Inhale 2 puffs into the lungs every 6 (six) hours as needed for wheezing or shortness of breath.     [provider]  allopurinol (ZYLOPRIM) 300 MG tablet Take 300 mg by mouth daily.      [provider]  alum & mag hydroxide-simeth (MAALOX/MYLANTA) 200-200-20 MG/5ML suspension Take 15 mLs by mouth every 6 (six) hours as needed for indigestion or heartburn.    [provider]  atorvastatin (LIPITOR) 20 MG tablet Take 20 mg by mouth at bedtime.    [provider]  cefdinir (OMNICEF) 300 MG capsule Take 1 capsule (300 mg total) by mouth 2 (two) times daily. 08/02/18   Sinda Du, MD  cholecalciferol (VITAMIN D) 1000 units tablet Take 1,000 Units by mouth daily.    [provider]  fluticasone (FLONASE) 50 MCG/ACT nasal spray Place  2 sprays into both nostrils daily.  05/29/18   [provider]  furosemide (LASIX) 40 MG tablet Take 40 mg by mouth every morning. & 20 mg in the evening    [provider]  gabapentin (NEURONTIN) 400 MG capsule Take 400 mg by mouth daily.     [provider]  hydrALAZINE (APRESOLINE) 100 MG tablet Take 1 tablet (100 mg total) by mouth 3 (three) times daily. 07/20/18   Arnoldo Lenis, MD  HYDROcodone-acetaminophen (NORCO/VICODIN) 5-325 MG tablet Take 1 tablet by mouth every 6 (six) hours as needed. 07/01/18   [provider]  insulin lispro (HUMALOG KWIKPEN) 100 UNIT/ML KiwkPen Inject 18-20 Units into the skin as directed. Inject 2 to 18 units based on sliding scale as directed as needed for blood glucose over 200    [provider]  isosorbide mononitrate (IMDUR) 30 MG 24 hr tablet TAKE 1 TABLET DAILY 07/31/18   Evans Lance, MD  LANTUS SOLOSTAR 100 UNIT/ML Solostar Pen Inject 20 Units into the skin daily.  06/23/18   [provider]  linaclotide Rolan Lipa) 145 MCG CAPS capsule Take 1 capsule (145 mcg total) by mouth daily before breakfast. 07/02/18   Sinda Du, MD  loratadine (CLARITIN) 10 MG tablet Take 10 mg by mouth daily.    [provider]  metoCLOPramide (REGLAN) 5 MG tablet Take 5 mg by mouth 3 (three) times daily before meals.    [provider]  metoprolol succinate (TOPROL-XL) 100 MG 24 hr tablet Take 1 tablet (100 mg total) by mouth 2 (two) times a day. Take with or immediately following a meal. 08/12/18 11/10/18  Arnoldo Lenis, MD  Multiple Vitamin (MULTIVITAMIN WITH MINERALS) TABS tablet Take 1 tablet by mouth daily. 09/07/15   Hosie Poisson, MD  ondansetron (ZOFRAN) 4 MG tablet Take 1 tablet (4 mg total) by mouth every 8 (eight) hours as needed for nausea or vomiting. 10/17/15   Carlis Stable, NP  pantoprazole (PROTONIX) 40 MG tablet Take 1 tablet (40 mg total) by mouth daily. 09/07/15   Hosie Poisson, MD  potassium chloride SA (K-DUR) 20 MEQ tablet Take 1 tablet (20 mEq total) by mouth 2 (two) times daily. 07/01/18   Sinda Du, MD  tamsulosin (FLOMAX) 0.4 MG CAPS capsule Take 1 capsule by mouth daily. 07/02/18   [provider]  traZODone (DESYREL) 50 MG tablet Take 0.5 tablets (25 mg total) by mouth at bedtime as needed for sleep. 07/01/18   Sinda Du, MD  vitamin C (ASCORBIC ACID) 500 MG tablet Take 500 mg by mouth 2 (two) times daily.    [provider]     Allergies:    No Known Allergies   Physical Exam:   Vitals  Blood pressure 122/72, pulse 73, temperature 98.6 F (37 C), temperature source Oral, resp. rate 14, height 5' 6" (1.676 m), weight 94.2 kg, SpO2 100 %.  1.  General: axoxo3  2. Psychiatric: euthymic  3. Neurologic: cn2-12 intact, reflexes 2+ symmetric, diffuse with downgoing toes bilaterally, motor 5/5 in all 4ext  4. HEENMT:  Anicteric, pupils 1.69m symmetric, direct, consensual, near intact Neck: no jvd, no bruit  5. Respiratory : CTAB  6. Cardiovascular : rrr s1, s2, 1/6 sem apex  7. Gastrointestinal:  Abd: soft, obese, nt, nd, +bs  8. Skin:  Ext: no c/c/e,  No rash  9.Musculoskeletal:  Good ROM,   No adenoapthy    Data Review:    CBC Recent Labs  Lab 09/07/18 2237  WBC 8.1  HGB 13.6  HCT 39.7  PLT 252  MCV 89.4  MCH 30.6  MCHC 34.3  RDW 14.5  LYMPHSABS 1.7  MONOABS 0.5  EOSABS 0.1  BASOSABS 0.0    ------------------------------------------------------------------------------------------------------------------  Results for orders placed or performed during the hospital encounter of 09/07/18 (from the past 48 hour(s))  Urinalysis, Routine w reflex microscopic     Status: Abnormal   Collection Time: 09/07/18 10:27 PM  Result Value Ref Range   Color, Urine AMBER (A) YELLOW    Comment: BIOCHEMICALS MAY BE AFFECTED BY COLOR   APPearance CLOUDY (A) CLEAR   Specific Gravity, Urine 1.015 1.005 - 1.030   pH 5.0 5.0 - 8.0   Glucose, UA NEGATIVE NEGATIVE mg/dL   Hgb urine dipstick NEGATIVE NEGATIVE   Bilirubin Urine NEGATIVE NEGATIVE   Ketones, ur NEGATIVE NEGATIVE mg/dL   Protein, ur 30 (A) NEGATIVE mg/dL   Nitrite NEGATIVE NEGATIVE   Leukocytes,Ua LARGE (A) NEGATIVE   RBC / HPF 11-20 0 - 5 RBC/hpf   WBC, UA >50 (H) 0 - 5 WBC/hpf   Bacteria, UA MANY (A) NONE SEEN   Squamous Epithelial / LPF 0-5 0 - 5   WBC Clumps PRESENT    Hyaline Casts, UA PRESENT     Comment: Performed at [facility], [address] [place], [place] [address]  CBC with Differential/Platelet     Status: None   Collection Time: 09/07/18 10:37 PM  Result Value Ref Range   WBC 8.1 4.0 - 10.5 K/uL   RBC 4.44 4.22 - 5.81 MIL/uL   Hemoglobin 13.6 13.0 - 17.0 g/dL   HCT 39.7 39.0 - 52.0 %   MCV 89.4 80.0 - 100.0 fL   MCH 30.6 26.0 - 34.0 pg   MCHC 34.3 30.0 - 36.0 g/dL   RDW 14.5 11.5 - 15.5 %   Platelets 252 150 - 400 K/uL   nRBC 0.0 0.0 - 0.2 %   Neutrophils Relative % 70 %   Neutro Abs 5.8 1.7 - 7.7 K/uL   Lymphocytes Relative 21 %   Lymphs Abs 1.7 0.7 - 4.0 K/uL   Monocytes Relative 6 %   Monocytes Absolute 0.5 0.1 - 1.0 K/uL   Eosinophils Relative 1 %   Eosinophils Absolute 0.1 0.0 - 0.5 K/uL   Basophils Relative 1 %   Basophils Absolute 0.0 0.0 - 0.1 K/uL   Immature Granulocytes 1 %   Abs Immature Granulocytes 0.04 0.00 - 0.07 K/uL    Comment: Performed at [facility], [address],  [place], [place] [address]  Comprehensive metabolic panel     Status: Abnormal   Collection Time: 09/07/18 10:37 PM  Result Value Ref Range   Sodium 130 (L) 135 - 145 mmol/L   Potassium 3.8 3.5 - 5.1 mmol/L   Chloride 90 (L) 98 - 111 mmol/L   CO2 25 22 - 32 mmol/L   Glucose, Bld 313 (H) 70 - 99 mg/dL   BUN 68 (H) 8 - 23 mg/dL   Creatinine, Ser 2.59 (H) 0.61 - 1.24 mg/dL   Calcium 9.9 8.9 - 10.3 mg/dL   Total Protein 7.9 6.5 - 8.1 g/dL   Albumin 4.1 3.5 - 5.0 g/dL   AST 13 (L) 15 - 41 U/L   ALT 11 0 - 44 U/L   Alkaline Phosphatase 72 38 - 126 U/L   Total Bilirubin 0.7 0.3 - 1.2 mg/dL   GFR calc non Af Amer 25 (L) >  60 mL/min   GFR calc Af Amer 29 (L) >60 mL/min   Anion gap 15 5 - 15    Comment: Performed at [facility], [address], [place], [place] [address]  SARS Coronavirus 2 (CEPHEID - Performed in [place] hospital lab), Hosp Order     Status: None   Collection Time: 09/07/18 11:53 PM   Specimen: Nasopharyngeal Swab  Result Value Ref Range   SARS Coronavirus 2 NEGATIVE NEGATIVE    Comment: (NOTE) If result is NEGATIVE SARS-CoV-2 target nucleic acids are NOT DETECTED. The SARS-CoV-2 RNA is generally detectable in upper and lower  respiratory specimens during the acute phase of infection. The lowest  concentration of SARS-CoV-2 viral copies this assay can detect is 250  copies / mL. A negative result does not preclude SARS-CoV-2 infection  and should not be used as the sole basis for treatment or other  patient management decisions.  A negative result may occur with  improper specimen collection / handling, submission of specimen other  than nasopharyngeal swab, presence of viral mutation(s) within the  areas targeted by this assay, and inadequate number of viral copies  (<250 copies / mL). A negative result must be combined with clinical  observations, patient history, and epidemiological information. If result is POSITIVE SARS-CoV-2 target nucleic acids are DETECTED. The  SARS-CoV-2 RNA is generally detectable in upper and lower  respiratory specimens dur ing the acute phase of infection.  Positive  results are indicative of active infection with SARS-CoV-2.  Clinical  correlation with patient history and other diagnostic information is  necessary to determine patient infection status.  Positive results do  not rule out bacterial infection or co-infection with other viruses. If result is PRESUMPTIVE POSTIVE SARS-CoV-2 nucleic acids MAY BE PRESENT.   A presumptive positive result was obtained on the submitted specimen  and confirmed on repeat testing.  While 2019 novel coronavirus  (SARS-CoV-2) nucleic acids may be present in the submitted sample  additional confirmatory testing may be necessary for epidemiological  and / or clinical management purposes  to differentiate between  SARS-CoV-2 and other Sarbecovirus currently known to infect humans.  If clinically indicated additional testing with an alternate test  methodology [phone]) is advised. The SARS-CoV-2 RNA is generally  detectable in upper and lower respiratory sp ecimens during the acute  phase of infection. The expected result is Negative. Fact Sheet for Patients:  StrictlyIdeas.no Fact Sheet for Healthcare Providers: BankingDealers.co.za This test is not yet approved or cleared by the [place] FDA and has been authorized for detection and/or diagnosis of SARS-CoV-2 by FDA under an Emergency Use Authorization (EUA).  This EUA will remain in effect (meaning this test can be used) for the duration of the COVID-19 declaration under Section 564(b)(1) of the Act, 21 U.S.C. section 360bbb-3(b)(1), unless the authorization is terminated or revoked sooner. Performed at [facility], [address] [place], [place] [address]     Chemistries  Recent Labs  Lab 09/07/18 2237  NA 130*  K 3.8  CL 90*  CO2 25  GLUCOSE 313*  BUN 68*  CREATININE  2.59*  CALCIUM 9.9  AST 13*  ALT 11  ALKPHOS 72  BILITOT 0.7   ------------------------------------------------------------------------------------------------------------------  ------------------------------------------------------------------------------------------------------------------ GFR: Estimated Creatinine Clearance: 30.2 mL/min (A) (by C-G formula based on SCr of 2.59 mg/dL (H)). Liver Function Tests: Recent Labs  Lab 09/07/18 2237  AST 13*  ALT 11  ALKPHOS 72  BILITOT 0.7  PROT 7.9  ALBUMIN 4.1  No results for input(s): LIPASE, AMYLASE in the last 168 hours. No results for input(s): AMMONIA in the last 168 hours. Coagulation Profile: No results for input(s): INR, PROTIME in the last 168 hours. Cardiac Enzymes: No results for input(s): CKTOTAL, CKMB, CKMBINDEX, TROPONINI in the last 168 hours. BNP (last 3 results) No results for input(s): PROBNP in the last 8760 hours. HbA1C: No results for input(s): HGBA1C in the last 72 hours. CBG: No results for input(s): GLUCAP in the last 168 hours. Lipid Profile: No results for input(s): CHOL, HDL, LDLCALC, TRIG, CHOLHDL, LDLDIRECT in the last 72 hours. Thyroid Function Tests: No results for input(s): TSH, T4TOTAL, FREET4, T3FREE, THYROIDAB in the last 72 hours. Anemia Panel: No results for input(s): VITAMINB12, FOLATE, FERRITIN, TIBC, IRON, RETICCTPCT in the last 72 hours.  --------------------------------------------------------------------------------------------------------------- Urine analysis:    Component Value Date/Time   COLORURINE AMBER (A) 09/07/2018 2227   APPEARANCEUR CLOUDY (A) 09/07/2018 2227   LABSPEC 1.015 09/07/2018 2227   PHURINE 5.0 09/07/2018 2227   GLUCOSEU NEGATIVE 09/07/2018 2227   HGBUR NEGATIVE 09/07/2018 2227   BILIRUBINUR NEGATIVE 09/07/2018 2227   KETONESUR NEGATIVE 09/07/2018 2227   PROTEINUR 30 (A) 09/07/2018 2227   UROBILINOGEN 0.2 12/02/2013 1739   NITRITE NEGATIVE  09/07/2018 2227   LEUKOCYTESUR LARGE (A) 09/07/2018 2227      Imaging Results:    Dg Chest Port 1 View  Result Date: 09/07/2018 CLINICAL DATA:  Weakness EXAM: PORTABLE CHEST 1 VIEW COMPARISON:  07/27/2018 FINDINGS: Cardiac shadow within normal limits. Defibrillator is again seen and stable. Lungs are hypoinflated but clear. No bony abnormality is noted. IMPRESSION: No active disease. Electronically Signed   By: Inez Catalina M.D.   On: 09/07/2018 23:12   ekg nsr at 42, Lad,1st degree avb,  poor R progression,    Assessment & Plan:    Principal Problem:   ARF (acute renal failure) (HCC) Active Problems:   GERD (gastroesophageal reflux disease)   COPD (chronic obstructive pulmonary disease) (HCC)   DM (diabetes mellitus) (HCC)   HTN (hypertension)   Chronic systolic CHF (congestive heart failure) (HCC)   NICM (nonischemic cardiomyopathy) (HCC)   Acute lower UTI  Generalized weakness secondary to UTI Acute lower UTI Urine culture Rocephin 1gm iv qday  ARF secondary to dehydration Hx of R hydronephrosis Check renal ultrasound Hydrate with ns iv Check cmp in am  Hyponatremia  Check serum osm, cortisol, tsh Check urine sodium, urine osm Hydrate with ns iv Check cm in am  Chronic systolic CHF, NICM s/p ICD Cont Toprol XL 158m po qday Cont Imdur 346mpo qday Cont Hydralazine 10025mo tid Cont Lasix 5m45m qday  Dm2 Cont Lantus 20 units [place] qday fsbs ac and qhs, ISS  Diabetic neuropathy Cont Gabapentin  Gastroparesis Cont Reglan Cont Zofran  Hyperlipidemia Cont Lipitor  Hx of Gout Cont Allopurinol 300mg24mqday  Gerd Cont PPI  Constipation Cont Linzess  BPH Cont Flomax 0.4mg p31mqhs  Insomnia Cont Trazodone 25mg p55ms  DVT Prophylaxis-  -heparin SCDs   AM Labs Ordered, also please review Full Orders  Family Communication: Admission, patients condition and plan of care including tests being ordered have been discussed with the patient who  indicate understanding and agree with the plan and Code Status.  Code Status: FULL CODE   Admission status: Observation: Based on patients clinical presentation and evaluation of above clinical data, I have made determination that patient meets observation criteria at this time.  Time spent in minutes :  [place] M.D on 09/08/2018 at 2:21 AM

## 2018-09-08 NOTE — Clinical Social Work Note (Signed)
Good Afternoon,  Thank you for your concerns regarding Frank Hoover.  We have screened the report regarding Frank Hoover, and I have attached the letter that discusses the screening decision.  Please let us know if you have any questions or concerns.  Thank you,  --  Bridgette J. Prichard [phone] ext. [address] ~ [address] [place], [place] [address] Phone [phone]               Fax [phone] September 08, 2018   [email or web address]    Re: Frank Hoover (DOB:[birth year])   To: Frank Hoover    Our agency appreciates the concern you demonstrated for the above-named person when you contacted our Adult Protective Services Unit.  After receiving your report on the above named person:  [x]  We will evaluate based on the allegations.  At the completion of the evaluation you will be notified of the findings.  []  We will refer the report to the [place] and/or [place].  []  We will refer the report to the [place] Specialist.  []  We will refer the report to an Outreach Social Worker to make contact with the family and offer assistance regarding this situation.  Thank you for your expressed interest in disabled adults.  Should you have future concerns about this person, please feel free to contact [phone] ext. [phone].  Sincerely,   Bridgette J. Nolic Work Librarian, academic

## 2018-09-08 NOTE — NC FL2 (Signed)
[place] LEVEL OF CARE SCREENING TOOL     IDENTIFICATION  Patient Name: Frank Hoover Birthdate: [birth year] Sex: male Admission Date (Current Location): 09/07/2018  [place] and [place] Number:  [place] [facility] [id number] [place] and Address:  [facility] [address] [place]      Provider Number: [id number]  Attending Physician Name and Address:  Sinda Du, MD  Relative Name and Phone Number:       Current Level of Care: Hospital Recommended Level of Care: [place] Prior Approval Number:    Date Approved/Denied:   PASRR Number: [id number]  Discharge Plan: SNF    Current Diagnoses: Patient Active Problem List   Diagnosis Date Noted  . ARF (acute renal failure) ([place]) 09/08/2018  . Acute renal failure (ARF) ([place]) 09/08/2018  . Severe sepsis ([place]) 06/26/2018  . Obstipation 06/26/2018  . Acute urinary retention 06/26/2018  . Mild bilateral Hydronephrosis 06/26/2018  . CKD (chronic kidney disease) stage 3, GFR 30-59 ml/min (HCC) 06/26/2018  . NICM (nonischemic cardiomyopathy) ([place]) 06/26/2018  . ICD (implantable cardioverter-defibrillator) in place 06/26/2018  . Hypokalemia 06/26/2018  . Hyperglycemia 06/26/2018  . Proctocolitis 06/26/2018  . Acute lower UTI 06/26/2018  . Pressure injury of skin 06/26/2018  . Chronic systolic CHF (congestive heart failure) ([place]) 06/11/2016  . Diarrhea 04/18/2016  . Anemia 10/17/2015  . Nausea and vomiting 10/17/2015  . Atrial flutter ([place]) 09/27/2015  . Pressure ulcer 09/05/2015  . Abdominal pain   . Atrial fibrillation with RVR ([place])   . HCAP (healthcare-associated pneumonia)   . Acute renal failure superimposed on stage 3 chronic kidney disease ([place])   . Metabolic encephalopathy   . Abdominal distension   . Urinary tract infection, site not specified   . Cardiogenic shock ([place])   . Acute systolic CHF (congestive heart failure) ([place])   . Uncontrolled type 2  diabetes mellitus with complication ([place])   . Urinary retention   . Bacteremia   . Ischemic colitis ([place])   . Noninfectious gastroenteritis, unspecified   . Proctosigmoiditis   . Lower GI bleed   . SVT (supraventricular tachycardia) ([place]) 08/24/2015  . Debility   . Blood in stool   . Acute blood loss anemia   . Abnormal CT scan, colon   . Acute systolic heart failure ([place])   . Acute kidney injury ([place])   . Acute respiratory failure ([place])   . Septic shock ([place])   . Acute encephalopathy   . Atrial flutter with rapid ventricular response ([place]) 08/14/2015  . Systolic CHF ([place]) 27/74/1287  . History of colonic polyps   . Dehydration 12/05/2013  . Acute renal failure ([place]) 12/02/2013  . Sleep apnea 12/02/2013  . Hyponatremia 12/02/2013  . Acute on chronic renal failure ([place]) 12/02/2013  . COPD (chronic obstructive pulmonary disease) ([place])   . DM (diabetes mellitus) ([place])   . HTN (hypertension)   . Hyperlipemia   . CRF (chronic renal failure)   . Hepatomegaly 03/25/2013  . Dysphagia 12/06/2010  . History of adenomatous polyp of colon 09/21/2010  . Gastroparesis 09/21/2010  . Obesity 09/21/2010  . GERD (gastroesophageal reflux disease) 10/09/2009  . Constipation 10/09/2009  . ABDOMINAL PAIN-EPIGASTRIC 10/09/2009    Orientation RESPIRATION BLADDER Height & Weight     Self, Situation, Place  O2(3L/M nasal cannula) Indwelling catheter Weight: 94.2 kg Height:  5\' 6"  (167.6 cm)  BEHAVIORAL SYMPTOMS/MOOD NEUROLOGICAL BOWEL NUTRITION STATUS  (none) (none) Continent Diet(HH/CM)  AMBULATORY STATUS COMMUNICATION OF NEEDS Skin  Extensive Assist Verbally PU Stage and Appropriate Care(Pressure injury on R heel, Sacrum and Bilateral buttocks)                       Personal Care Assistance Level of Assistance  Bathing, Feeding, Dressing Bathing Assistance: Maximum assistance Feeding assistance: Independent Dressing Assistance: Maximum assistance     Functional Limitations  Info  Sight, Hearing, Speech Sight Info: Adequate Hearing Info: Adequate Speech Info: Adequate    SPECIAL CARE FACTORS FREQUENCY  PT (By licensed PT)     PT Frequency: 5X/W              Contractures Contractures Info: Not present    Additional Factors Info  Code Status, Allergies Code Status Info: full Allergies Info: NKA           Current Medications (09/08/2018):  This is the current hospital active medication list Current Facility-Administered Medications  Medication Dose Route Frequency Provider Last Rate Last Dose  . 0.9 %  sodium chloride infusion   Intravenous Continuous Jani Gravel, MD 50 mL/hr at 09/08/18 0315    . acetaminophen (TYLENOL) tablet 650 mg  650 mg Oral Q6H PRN Jani Gravel, MD       Or  . acetaminophen (TYLENOL) suppository 650 mg  650 mg Rectal Q6H PRN Jani Gravel, MD      . albuterol (PROVENTIL) (2.5 MG/3ML) 0.083% nebulizer solution 2.5 mg  2.5 mg Inhalation Q6H PRN Jani Gravel, MD      . allopurinol (ZYLOPRIM) tablet 300 mg  300 mg Oral Daily Jani Gravel, MD   300 mg at 09/08/18 0916  . atorvastatin (LIPITOR) tablet 20 mg  20 mg Oral QHS Jani Gravel, MD      . cefTRIAXone (ROCEPHIN) 1 g in sodium chloride 0.9 % 100 mL IVPB  1 g Intravenous Q24H Jani Gravel, MD      . cholecalciferol (VITAMIN D3) tablet 1,000 Units  1,000 Units Oral Daily Jani Gravel, MD   1,000 Units at 09/08/18 0919  . fluticasone (FLONASE) 50 MCG/ACT nasal spray 2 spray  2 spray Each Nare Daily Jani Gravel, MD   2 spray at 09/08/18 0919  . furosemide (LASIX) tablet 40 mg  40 mg Oral Zara Chess, MD   40 mg at 09/08/18 0603  . gabapentin (NEURONTIN) capsule 400 mg  400 mg Oral Daily Jani Gravel, MD   400 mg at 09/08/18 0917  . heparin injection 5,000 Units  5,000 Units Subcutaneous Lysle Dingwall, MD   5,000 Units at 09/08/18 0603  . hydrALAZINE (APRESOLINE) tablet 100 mg  100 mg Oral TID Jani Gravel, MD   100 mg at 09/08/18 0915  . HYDROcodone-acetaminophen (NORCO/VICODIN) 5-325 MG per  tablet 1 tablet  1 tablet Oral Q6H PRN Jani Gravel, MD      . insulin aspart (novoLOG) injection 0-5 Units  0-5 Units Subcutaneous QHS Jani Gravel, MD      . insulin aspart (novoLOG) injection 0-9 Units  0-9 Units Subcutaneous TID WC Jani Gravel, MD   3 Units at 09/08/18 0915  . insulin glargine (LANTUS) injection 20 Units  20 Units Subcutaneous Daily Jani Gravel, MD      . isosorbide mononitrate (IMDUR) 24 hr tablet 30 mg  30 mg Oral Daily Jani Gravel, MD   30 mg at 09/08/18 0916  . linaclotide (LINZESS) capsule 145 mcg  145 mcg Oral QAC breakfast Jani Gravel, MD   145 mcg at 09/08/18 [phone]  . loratadine (  CLARITIN) tablet 10 mg  10 mg Oral Daily Jani Gravel, MD   10 mg at 09/08/18 0917  . metoCLOPramide (REGLAN) tablet 5 mg  5 mg Oral TID Carmelia Roller, MD   5 mg at 09/08/18 0919  . metoprolol succinate (TOPROL-XL) 24 hr tablet 100 mg  100 mg Oral BID Jani Gravel, MD   100 mg at 09/08/18 0915  . multivitamin with minerals tablet 1 tablet  1 tablet Oral Daily Jani Gravel, MD   1 tablet at 09/08/18 [phone]  . ondansetron (ZOFRAN) tablet 4 mg  4 mg Oral Q8H PRN Jani Gravel, MD      . pantoprazole (PROTONIX) EC tablet 40 mg  40 mg Oral Daily Jani Gravel, MD   40 mg at 09/08/18 4142  . potassium chloride SA (K-DUR) CR tablet 20 mEq  20 mEq Oral BID Jani Gravel, MD   20 mEq at 09/08/18 0917  . tamsulosin (FLOMAX) capsule 0.4 mg  0.4 mg Oral Daily Jani Gravel, MD   0.4 mg at 09/08/18 3953  . traZODone (DESYREL) tablet 25 mg  25 mg Oral QHS PRN Jani Gravel, MD      . vitamin C (ASCORBIC ACID) tablet 500 mg  500 mg Oral BID Jani Gravel, MD   500 mg at 09/08/18 2023     Discharge Medications: Please see discharge summary for a list of discharge medications.  Relevant Imaging Results:  Relevant Lab Results:   Additional Information [place], [place]

## 2018-09-08 NOTE — ED Provider Notes (Signed)
Care assumed from Dr. Lita Mains, patient presenting with feeling hot and weak, labs pending.  He has had normal vital signs in the ED.  Chest x-ray was unremarkable.  Urinalysis has come back with evidence of UTI.  There were greater than 50 WBCs per high-power field with WBC clumps present and many bacteria.  Blood work shows mild hyponatremia and evidence of acute kidney injury.  Creatinine is 2.59 compared with 1.31 on Aug 02, 2018.  On review of old records, he had been admitted the end of May for urinary tract infection with Proteus and E. coli which were sensitive to ceftriaxone and he had been discharged on cefdinir.  He is started on ceftriaxone again.  Because of acute kidney injury, I feel he needs to be treated as an inpatient.  He is given IV fluids.  Case is discussed with Dr. Maudie Mercury of [facility] hospitalist, who agrees to admit the patient.  Results for orders placed or performed during the hospital encounter of 09/07/18  CBC with Differential/Platelet  Result Value Ref Range   WBC 8.1 4.0 - 10.5 K/uL   RBC 4.44 4.22 - 5.81 MIL/uL   Hemoglobin 13.6 13.0 - 17.0 g/dL   HCT 39.7 39.0 - 52.0 %   MCV 89.4 80.0 - 100.0 fL   MCH 30.6 26.0 - 34.0 pg   MCHC 34.3 30.0 - 36.0 g/dL   RDW 14.5 11.5 - 15.5 %   Platelets 252 150 - 400 K/uL   nRBC 0.0 0.0 - 0.2 %   Neutrophils Relative % 70 %   Neutro Abs 5.8 1.7 - 7.7 K/uL   Lymphocytes Relative 21 %   Lymphs Abs 1.7 0.7 - 4.0 K/uL   Monocytes Relative 6 %   Monocytes Absolute 0.5 0.1 - 1.0 K/uL   Eosinophils Relative 1 %   Eosinophils Absolute 0.1 0.0 - 0.5 K/uL   Basophils Relative 1 %   Basophils Absolute 0.0 0.0 - 0.1 K/uL   Immature Granulocytes 1 %   Abs Immature Granulocytes 0.04 0.00 - 0.07 K/uL  Comprehensive metabolic panel  Result Value Ref Range   Sodium 130 (L) 135 - 145 mmol/L   Potassium 3.8 3.5 - 5.1 mmol/L   Chloride 90 (L) 98 - 111 mmol/L   CO2 25 22 - 32 mmol/L   Glucose, Bld 313 (H) 70 - 99 mg/dL   BUN 68 (H) 8 - 23  mg/dL   Creatinine, Ser 2.59 (H) 0.61 - 1.24 mg/dL   Calcium 9.9 8.9 - 10.3 mg/dL   Total Protein 7.9 6.5 - 8.1 g/dL   Albumin 4.1 3.5 - 5.0 g/dL   AST 13 (L) 15 - 41 U/L   ALT 11 0 - 44 U/L   Alkaline Phosphatase 72 38 - 126 U/L   Total Bilirubin 0.7 0.3 - 1.2 mg/dL   GFR calc non Af Amer 25 (L) >60 mL/min   GFR calc Af Amer 29 (L) >60 mL/min   Anion gap 15 5 - 15  Urinalysis, Routine w reflex microscopic  Result Value Ref Range   Color, Urine AMBER (A) YELLOW   APPearance CLOUDY (A) CLEAR   Specific Gravity, Urine 1.015 1.005 - 1.030   pH 5.0 5.0 - 8.0   Glucose, UA NEGATIVE NEGATIVE mg/dL   Hgb urine dipstick NEGATIVE NEGATIVE   Bilirubin Urine NEGATIVE NEGATIVE   Ketones, ur NEGATIVE NEGATIVE mg/dL   Protein, ur 30 (A) NEGATIVE mg/dL   Nitrite NEGATIVE NEGATIVE   Leukocytes,Ua LARGE (A) NEGATIVE  RBC / HPF 11-20 0 - 5 RBC/hpf   WBC, UA >50 (H) 0 - 5 WBC/hpf   Bacteria, UA MANY (A) NONE SEEN   Squamous Epithelial / LPF 0-5 0 - 5   WBC Clumps PRESENT    Hyaline Casts, UA PRESENT    Dg Chest Port 1 View  Result Date: 09/07/2018 CLINICAL DATA:  Weakness EXAM: PORTABLE CHEST 1 VIEW COMPARISON:  07/27/2018 FINDINGS: Cardiac shadow within normal limits. Defibrillator is again seen and stable. Lungs are hypoinflated but clear. No bony abnormality is noted. IMPRESSION: No active disease. Electronically Signed   By: Inez Catalina M.D.   On: 18/84/1660 63:01      Delora Fuel, MD 60/10/93 [phone]
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
## 2018-09-08 NOTE — Plan of Care (Signed)
  Problem: Acute Rehab PT Goals(only PT should resolve) Goal: Pt Will Go Supine/Side To Sit Outcome: Progressing Flowsheets (Taken 09/08/2018 1038) Pt will go Supine/Side to Sit: with moderate assist Goal: Patient Will Transfer Sit To/From Stand Outcome: Progressing Flowsheets (Taken 09/08/2018 1038) Patient will transfer sit to/from stand: with moderate assist Goal: Pt Will Transfer Bed To Chair/Chair To Bed Outcome: Progressing Flowsheets (Taken 09/08/2018 1038) Pt will Transfer Bed to Chair/Chair to Bed: with mod assist Goal: Pt Will Ambulate Outcome: Progressing Flowsheets (Taken 09/08/2018 1038) Pt will Ambulate:  10 feet  with moderate assist  with rolling walker   10:38 AM, 09/08/18 Lonell Grandchild, MPT Physical Therapist with [facility] [phone] office [phone] mobile phone

## 2018-09-08 NOTE — Evaluation (Signed)
Physical Therapy Evaluation Patient Details Name: Frank Hoover MRN: [id number] DOB: 05/25/1950 Today's Date: 09/08/2018   History of Present Illness  Frank Hoover  is a 68 y.o. 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  Slight nausea.   Pt denies fever, chills, cp, palp, sob beyond his baseline, emesis, abd pain, diarrhea, brbpr, dysuria, hematuria, focal neurological weakness.    Clinical Impression  Patient agreeable for therapy and demonstrates slow labored movement for sitting up at bedside requiring much assistance, unable to maintain standing balance with knees buckling or fully extend trunk when attempting sit to stands due to generalized weakness.  Patient put back to bed with Max assistance to reposition.  Patient will benefit from continued physical therapy in hospital and recommended venue below to increase strength, balance, endurance for safe ADLs and gait.    Follow Up Recommendations SNF    Equipment Recommendations  None recommended by PT    Recommendations for Other Services       Precautions / Restrictions Precautions Precautions: Fall Restrictions Weight Bearing Restrictions: No      Mobility  Bed Mobility Overal bed mobility: Needs Assistance Bed Mobility: Supine to Sit;Sit to Supine     Supine to sit: Max assist Sit to supine: Max assist   General bed mobility comments: slow labored movement  Transfers Overall transfer level: Needs assistance Equipment used: Rolling walker (2 wheeled) Transfers: Sit to/from Stand Sit to Stand: Max assist         General transfer comment: limited due to BLE, unable to extend trunk or fully lock knees due to weakness  Ambulation/Gait                 Stairs            Wheelchair Mobility    Modified Rankin (Stroke Patients Only)       Balance Overall balance assessment: Needs assistance Sitting-balance support: Feet supported;No upper extremity supported Sitting balance-Leahy Scale: Fair Sitting balance - Comments: seated at bedside   Standing balance support: During functional activity;Bilateral upper extremity supported Standing balance-Leahy Scale: Poor Standing balance comment: using RW                             Pertinent Vitals/Pain Pain Assessment: No/denies pain    Home Living Family/patient expects to be discharged to:: Private residence   Available Help at Discharge: Family;Available 24 hours/day Type of Home: Apartment Home Access: Level entry     Home Layout: One level Home Equipment: Walker - 4 wheels;Hospital bed;Wheelchair - [employer];Shower seat      Prior Function Level of Independence: Needs assistance   Gait / Transfers Assistance Needed: household and short community distanced ambulator with Radiation protection practitioner, drives  ADL's / Homemaking Assistance Needed: assisted by family        Hand Dominance   Dominant Hand: Right    Extremity/Trunk Assessment   Upper Extremity Assessment Upper Extremity Assessment: Generalized weakness    Lower Extremity Assessment Lower Extremity Assessment: Generalized weakness    Cervical / Trunk Assessment Cervical / Trunk Assessment: Normal  Communication   Communication: No difficulties  Cognition Arousal/Alertness: Awake/alert Behavior During Therapy: WFL for tasks assessed/performed Overall Cognitive Status: Within  Functional Limits for tasks assessed                                        General Comments      Exercises     Assessment/Plan    PT Assessment Patient needs continued PT services  PT Problem List Decreased strength;Decreased activity tolerance;Decreased balance;Decreased mobility        PT Treatment Interventions Gait training;Functional mobility training;Therapeutic activities;Therapeutic exercise;Patient/family education    PT Goals (Current goals can be found in the Care Plan section)  Acute Rehab PT Goals Patient Stated Goal: return home after rehab PT Goal Formulation: With patient Time For Goal Achievement: 09/22/18 Potential to Achieve Goals: Good    Frequency Min 3X/week   Barriers to discharge        Co-evaluation               AM-PAC PT "6 Clicks" Mobility  Outcome Measure Help needed turning from your back to your side while in a flat bed without using bedrails?: A Lot Help needed moving from lying on your back to sitting on the side of a flat bed without using bedrails?: A Lot Help needed moving to and from a bed to a chair (including a wheelchair)?: Total Help needed standing up from a chair using your arms (e.g., wheelchair or bedside chair)?: A Lot Help needed to walk in hospital room?: Total Help needed climbing 3-5 steps with a railing? : Total 6 Click Score: 9    End of Session   Activity Tolerance: Patient tolerated treatment well;Patient limited by fatigue Patient left: in bed;with call bell/phone within reach;with bed alarm set Nurse Communication: Mobility status PT Visit Diagnosis: Unsteadiness on feet (R26.81);Other abnormalities of gait and mobility (R26.89);Muscle weakness (generalized) (M62.81)    Time: 2992-4268 PT Time Calculation (min) (ACUTE ONLY): 31 min   Charges:   PT Evaluation $PT Eval Moderate Complexity: 1 Mod PT Treatments $Therapeutic Activity: 23-37 mins        10:36 AM, 09/08/18 Lonell Grandchild, MPT Physical Therapist with [facility] [phone] office [phone] mobile phone

## 2018-09-08 NOTE — Progress Notes (Signed)
Inpatient Diabetes Program Recommendations  AACE/ADA: New Consensus Statement on Inpatient Glycemic Control (2015)  Target Ranges:  Prepandial:   less than 140 mg/dL      Peak postprandial:   less than 180 mg/dL (1-2 hours)      Critically ill patients:  140 - 180 mg/dL   Lab Results  Component Value Date   GLUCAP 232 (H) 09/08/2018   HGBA1C 7.2 (H) 07/31/2018    Review of Glycemic Control  Diabetes history: DM2 Outpatient Diabetes medications: Lantus 20 units qd + Humalog 2-18 units tid meal coverage Current orders for Inpatient glycemic control: Lantus 20 units + Novolog sensitive correction tid + hs 0-5 units  Inpatient Diabetes Program Recommendations:   -may need Novolog 3 units tid meal coverage if eats 50%  Thank you, Bethena Roys E. Derra Shartzer, RN, MSN, CDE  Diabetes Coordinator Inpatient Glycemic Control Team Team Pager [phone] (8am-5pm) 09/08/2018 10:27 AM

## 2018-09-08 NOTE — Progress Notes (Signed)
0200- Asking patient admission questions, asked how things are at home. Patient teared up. Asked does patient feel safe at home, patient stated "I feel safe at the hospital." RN spoke with patient's son Mujtaba Bollig. Who stated patient is from home with his wife, and that she is older and having difficulty caring for the patient. Son expressed great willingness to assist in any way he is able. Son also mentioned he had "not been allowed" to see his father but did not go into why and stated a nephew was supposed to be helping care for the patient "but was just collecting the disability"  RN feels the patient would benefit greatly from home care services.

## 2018-09-09 LAB — GLUCOSE, CAPILLARY
Glucose-Capillary: 219 mg/dL — ABNORMAL HIGH (ref 70–99)
Glucose-Capillary: 263 mg/dL — ABNORMAL HIGH (ref 70–99)
Glucose-Capillary: 268 mg/dL — ABNORMAL HIGH (ref 70–99)
Glucose-Capillary: 256 mg/dL — ABNORMAL HIGH (ref 70–99)

## 2018-09-09 MED ORDER — DOCUSATE SODIUM 100 MG PO CAPS
200.0000 mg | ORAL_CAPSULE | Freq: Every day | ORAL | Status: DC
  Administered 2018-09-09 – 2018-09-11 (×3): 200 mg via ORAL
  Filled 2018-09-09 (×4): qty 2

## 2018-09-09 MED ORDER — SODIUM CHLORIDE 0.9 % IV SOLN
INTRAVENOUS | Status: DC
  Administered 2018-09-09 – 2018-09-10 (×3): via INTRAVENOUS

## 2018-09-09 NOTE — Progress Notes (Signed)
Subjective: He says he feels better this morning.  He was evaluated by social work and by physical therapy and plans are this time for him to go to skilled care facility.  There has been significant problems at home and his wife is having increasing difficulty taking care of him.  His renal function is better but not back to baseline  Objective: Vital signs in last 24 hours: Temp:  [97.7 F (36.5 C)-98.7 F (37.1 C)] 98.3 F (36.8 C) (07/08 0456) Pulse Rate:  [64-72] 64 (07/08 0456) Resp:  [14-16] 16 (07/08 0456) BP: (122-136)/(74-95) 129/74 (07/08 0456) SpO2:  [96 %-100 %] 100 % (07/08 0456) Weight:  [96.8 kg] 96.8 kg (07/08 0456) Weight change: 6.08 kg Last BM Date: (PTA)  Intake/Output from previous day: 07/07 0701 - 07/08 0700 In: 1680 [P.O.:1680] Out: 1000 [Urine:1000]  PHYSICAL EXAM General appearance: alert, cooperative and no distress Resp: clear to auscultation bilaterally Cardio: regular rate and rhythm, S1, S2 normal, no murmur, click, rub or gallop GI: soft, non-tender; bowel sounds normal; no masses,  no organomegaly Extremities: extremities normal, atraumatic, no cyanosis or edema  Lab Results:  Results for orders placed or performed during the hospital encounter of 09/07/18 (from the past 48 hour(s))  Urinalysis, Routine w reflex microscopic     Status: Abnormal   Collection Time: 09/07/18 10:27 PM  Result Value Ref Range   Color, Urine AMBER (A) YELLOW    Comment: BIOCHEMICALS MAY BE AFFECTED BY COLOR   APPearance CLOUDY (A) CLEAR   Specific Gravity, Urine 1.015 1.005 - 1.030   pH 5.0 5.0 - 8.0   Glucose, UA NEGATIVE NEGATIVE mg/dL   Hgb urine dipstick NEGATIVE NEGATIVE   Bilirubin Urine NEGATIVE NEGATIVE   Ketones, ur NEGATIVE NEGATIVE mg/dL   Protein, ur 30 (A) NEGATIVE mg/dL   Nitrite NEGATIVE NEGATIVE   Leukocytes,Ua LARGE (A) NEGATIVE   RBC / HPF 11-20 0 - 5 RBC/hpf   WBC, UA >50 (H) 0 - 5 WBC/hpf   Bacteria, UA MANY (A) NONE SEEN   Squamous  Epithelial / LPF 0-5 0 - 5   WBC Clumps PRESENT    Hyaline Casts, UA PRESENT     Comment: Performed at [facility], [address] [place], [place] [address]  CBC with Differential/Platelet     Status: None   Collection Time: 09/07/18 10:37 PM  Result Value Ref Range   WBC 8.1 4.0 - 10.5 K/uL   RBC 4.44 4.22 - 5.81 MIL/uL   Hemoglobin 13.6 13.0 - 17.0 g/dL   HCT 39.7 39.0 - 52.0 %   MCV 89.4 80.0 - 100.0 fL   MCH 30.6 26.0 - 34.0 pg   MCHC 34.3 30.0 - 36.0 g/dL   RDW 14.5 11.5 - 15.5 %   Platelets 252 150 - 400 K/uL   nRBC 0.0 0.0 - 0.2 %   Neutrophils Relative % 70 %   Neutro Abs 5.8 1.7 - 7.7 K/uL   Lymphocytes Relative 21 %   Lymphs Abs 1.7 0.7 - 4.0 K/uL   Monocytes Relative 6 %   Monocytes Absolute 0.5 0.1 - 1.0 K/uL   Eosinophils Relative 1 %   Eosinophils Absolute 0.1 0.0 - 0.5 K/uL   Basophils Relative 1 %   Basophils Absolute 0.0 0.0 - 0.1 K/uL   Immature Granulocytes 1 %   Abs Immature Granulocytes 0.04 0.00 - 0.07 K/uL    Comment: Performed at [facility], [address], [place], [place] [address]  Comprehensive  metabolic panel     Status: Abnormal   Collection Time: 09/07/18 10:37 PM  Result Value Ref Range   Sodium 130 (L) 135 - 145 mmol/L   Potassium 3.8 3.5 - 5.1 mmol/L   Chloride 90 (L) 98 - 111 mmol/L   CO2 25 22 - 32 mmol/L   Glucose, Bld 313 (H) 70 - 99 mg/dL   BUN 68 (H) 8 - 23 mg/dL   Creatinine, Ser 2.59 (H) 0.61 - 1.24 mg/dL   Calcium 9.9 8.9 - 10.3 mg/dL   Total Protein 7.9 6.5 - 8.1 g/dL   Albumin 4.1 3.5 - 5.0 g/dL   AST 13 (L) 15 - 41 U/L   ALT 11 0 - 44 U/L   Alkaline Phosphatase 72 38 - 126 U/L   Total Bilirubin 0.7 0.3 - 1.2 mg/dL   GFR calc non Af Amer 25 (L) >60 mL/min   GFR calc Af Amer 29 (L) >60 mL/min   Anion gap 15 5 - 15    Comment: Performed at [facility], [address], [place], [place] [address]  Urine culture     Status: Abnormal (Preliminary result)   Collection Time: 09/07/18 11:46 PM   Specimen: Urine, Random   Result Value Ref Range   Specimen Description      URINE, RANDOM Performed at [facility], [address] [place], [place] [address]    Special Requests      NONE Performed at [facility], [address] [place], [place] [address]    Culture (A)     >=100,000 COLONIES/mL GRAM NEGATIVE RODS CULTURE REINCUBATED FOR BETTER GROWTH Performed at [facility], [address] [place], [place] [address]    Report Status PENDING   SARS Coronavirus 2 (CEPHEID - Performed in [facility] hospital lab), Hosp Order     Status: None   Collection Time: 09/07/18 11:53 PM   Specimen: Nasopharyngeal Swab  Result Value Ref Range   SARS Coronavirus 2 NEGATIVE NEGATIVE    Comment: (NOTE) If result is NEGATIVE SARS-CoV-2 target nucleic acids are NOT DETECTED. The SARS-CoV-2 RNA is generally detectable in upper and lower  respiratory specimens during the acute phase of infection. The lowest  concentration of SARS-CoV-2 viral copies this assay can detect is 250  copies / mL. A negative result does not preclude SARS-CoV-2 infection  and should not be used as the sole basis for treatment or other  patient management decisions.  A negative result may occur with  improper specimen collection / handling, submission of specimen other  than nasopharyngeal swab, presence of viral mutation(s) within the  areas targeted by this assay, and inadequate number of viral copies  (<250 copies / mL). A negative result must be combined with clinical  observations, patient history, and epidemiological information. If result is POSITIVE SARS-CoV-2 target nucleic acids are DETECTED. The SARS-CoV-2 RNA is generally detectable in upper and lower  respiratory specimens dur ing the acute phase of infection.  Positive  results are indicative of active infection with SARS-CoV-2.  Clinical  correlation with patient history and other diagnostic information is  necessary to determine patient infection status.  Positive  results do  not rule out bacterial infection or co-infection with other viruses. If result is PRESUMPTIVE POSTIVE SARS-CoV-2 nucleic acids MAY BE PRESENT.   A presumptive positive result was obtained on the submitted specimen  and confirmed on repeat testing.  While 2019 novel coronavirus  (SARS-CoV-2) nucleic acids may be present in the  submitted sample  additional confirmatory testing may be necessary for epidemiological  and / or clinical management purposes  to differentiate between  SARS-CoV-2 and other Sarbecovirus currently known to infect humans.  If clinically indicated additional testing with an alternate test  methodology [phone]) is advised. The SARS-CoV-2 RNA is generally  detectable in upper and lower respiratory sp ecimens during the acute  phase of infection. The expected result is Negative. Fact Sheet for Patients:  StrictlyIdeas.no Fact Sheet for Healthcare Providers: BankingDealers.co.za This test is not yet approved or cleared by the [place] FDA and has been authorized for detection and/or diagnosis of SARS-CoV-2 by FDA under an Emergency Use Authorization (EUA).  This EUA will remain in effect (meaning this test can be used) for the duration of the COVID-19 declaration under Section 564(b)(1) of the Act, 21 U.S.C. section 360bbb-3(b)(1), unless the authorization is terminated or revoked sooner. Performed at [facility], [address], [place], [place] [address]   Comprehensive metabolic panel     Status: Abnormal   Collection Time: 09/08/18  4:54 AM  Result Value Ref Range   Sodium 135 135 - 145 mmol/L   Potassium 3.3 (L) 3.5 - 5.1 mmol/L   Chloride 99 98 - 111 mmol/L   CO2 26 22 - 32 mmol/L   Glucose, Bld 255 (H) 70 - 99 mg/dL   BUN 60 (H) 8 - 23 mg/dL   Creatinine, Ser 2.29 (H) 0.61 - 1.24 mg/dL   Calcium 9.5 8.9 - 10.3 mg/dL   Total Protein 6.9 6.5 - 8.1 g/dL   Albumin 3.8 3.5 - 5.0 g/dL   AST 12  (L) 15 - 41 U/L   ALT 9 0 - 44 U/L   Alkaline Phosphatase 64 38 - 126 U/L   Total Bilirubin 0.5 0.3 - 1.2 mg/dL   GFR calc non Af Amer 29 (L) >60 mL/min   GFR calc Af Amer 33 (L) >60 mL/min   Anion gap 10 5 - 15    Comment: Performed at [facility], [address], [place], [place] [address]  CBC     Status: Abnormal   Collection Time: 09/08/18  4:54 AM  Result Value Ref Range   WBC 7.3 4.0 - 10.5 K/uL   RBC 4.04 (L) 4.22 - 5.81 MIL/uL   Hemoglobin 12.2 (L) 13.0 - 17.0 g/dL   HCT 36.3 (L) 39.0 - 52.0 %   MCV 89.9 80.0 - 100.0 fL   MCH 30.2 26.0 - 34.0 pg   MCHC 33.6 30.0 - 36.0 g/dL   RDW 14.3 11.5 - 15.5 %   Platelets 251 150 - 400 K/uL   nRBC 0.0 0.0 - 0.2 %    Comment: Performed at [facility], [address] [place], [place] [address]  Osmolality     Status: Abnormal   Collection Time: 09/08/18  4:54 AM  Result Value Ref Range   Osmolality 310 (H) 275 - 295 mOsm/kg    Comment: Performed at [facility], [address] [place], [place] [address]  Cortisol     Status: None   Collection Time: 09/08/18  4:54 AM  Result Value Ref Range   Cortisol, Plasma 7.2 ug/dL    Comment: (NOTE) AM    6.7 - 22.6 ug/dL PM   <10.0       ug/dL Performed at [facility] [address] [place], [place] [address]   TSH     Status: Abnormal   Collection Time: 09/08/18  4:54 AM  Result Value Ref Range   TSH 64.228 (H) 0.350 - 4.500 uIU/mL    Comment: Performed by a 3rd Generation assay with a functional sensitivity of <=0.01 uIU/mL. Performed at [facility], [address], [place], [place] [address]   Glucose, capillary     Status: Abnormal   Collection Time: 09/08/18  7:47 AM  Result Value Ref Range   Glucose-Capillary 232 (H) 70 - 99 mg/dL  Glucose, capillary     Status: Abnormal   Collection Time: 09/08/18 11:20 AM  Result Value Ref Range   Glucose-Capillary 266 (H) 70 - 99 mg/dL  Glucose, capillary     Status: Abnormal   Collection Time: 09/08/18  4:28 PM  Result  Value Ref Range   Glucose-Capillary 228 (H) 70 - 99 mg/dL  Glucose, capillary     Status: Abnormal   Collection Time: 09/08/18  8:47 PM  Result Value Ref Range   Glucose-Capillary 237 (H) 70 - 99 mg/dL  Glucose, capillary     Status: Abnormal   Collection Time: 09/09/18  7:46 AM  Result Value Ref Range   Glucose-Capillary 219 (H) 70 - 99 mg/dL    ABGS No results for input(s): PHART, PO2ART, TCO2, HCO3 in the last 72 hours.  Invalid input(s): PCO2 CULTURES Recent Results (from the past 240 hour(s))  Urine culture     Status: Abnormal (Preliminary result)   Collection Time: 09/07/18 11:46 PM   Specimen: Urine, Random  Result Value Ref Range Status   Specimen Description   Final    URINE, RANDOM Performed at [facility], [address], [place], [place] [address]    Special Requests   Final    NONE Performed at [facility], [address], [place], [place] [address]    Culture (A)  Final    >=100,000 COLONIES/mL GRAM NEGATIVE RODS CULTURE REINCUBATED FOR BETTER GROWTH Performed at [facility], [place] [address], [place], [place] [address]    Report Status PENDING  Incomplete  SARS Coronavirus 2 (CEPHEID - Performed in [place] hospital lab), Hosp Order     Status: None   Collection Time: 09/07/18 11:53 PM   Specimen: Nasopharyngeal Swab  Result Value Ref Range Status   SARS Coronavirus 2 NEGATIVE NEGATIVE Final    Comment: (NOTE) If result is NEGATIVE SARS-CoV-2 target nucleic acids are NOT DETECTED. The SARS-CoV-2 RNA is generally detectable in upper and lower  respiratory specimens during the acute phase of infection. The lowest  concentration of SARS-CoV-2 viral copies this assay can detect is 250  copies / mL. A negative result does not preclude SARS-CoV-2 infection  and should not be used as the sole basis for treatment or other  patient management decisions.  A negative result may occur with  improper specimen collection / handling, submission of specimen  other  than nasopharyngeal swab, presence of viral mutation(s) within the  areas targeted by this assay, and inadequate number of viral copies  (<250 copies / mL). A negative result must be combined with clinical  observations, patient history, and epidemiological information. If result is POSITIVE SARS-CoV-2 target nucleic acids are DETECTED. The SARS-CoV-2 RNA is generally detectable in upper and lower  respiratory specimens dur ing the acute phase of infection.  Positive  results are indicative of active infection with SARS-CoV-2.  Clinical  correlation with patient history and other diagnostic information is  necessary to determine patient infection status.  Positive results do  not rule out bacterial infection  or co-infection with other viruses. If result is PRESUMPTIVE POSTIVE SARS-CoV-2 nucleic acids MAY BE PRESENT.   A presumptive positive result was obtained on the submitted specimen  and confirmed on repeat testing.  While 2019 novel coronavirus  (SARS-CoV-2) nucleic acids may be present in the submitted sample  additional confirmatory testing may be necessary for epidemiological  and / or clinical management purposes  to differentiate between  SARS-CoV-2 and other Sarbecovirus currently known to infect humans.  If clinically indicated additional testing with an alternate test  methodology [phone]) is advised. The SARS-CoV-2 RNA is generally  detectable in upper and lower respiratory sp ecimens during the acute  phase of infection. The expected result is Negative. Fact Sheet for Patients:  StrictlyIdeas.no Fact Sheet for Healthcare Providers: BankingDealers.co.za This test is not yet approved or cleared by the [place] FDA and has been authorized for detection and/or diagnosis of SARS-CoV-2 by FDA under an Emergency Use Authorization (EUA).  This EUA will remain in effect (meaning this test can be used) for the duration  of the COVID-19 declaration under Section 564(b)(1) of the Act, 21 U.S.C. section 360bbb-3(b)(1), unless the authorization is terminated or revoked sooner. Performed at [facility], [address] [place], [place] [address]    Studies/Results: US Renal  Result Date: 09/08/2018 CLINICAL DATA:  Acute renal failure. EXAM: RENAL / URINARY TRACT ULTRASOUND COMPLETE COMPARISON:  None. FINDINGS: Right Kidney: Renal measurements: 7.9 x 4.6 x 5.3 cm = volume: 99.8 mL . Echogenicity within normal limits. No mass or hydronephrosis visualized. Left Kidney: Renal measurements: 8.7 x 4.7 x 5.6 cm = volume: 120.0 mL. Echogenicity within normal limits. No mass or hydronephrosis visualized. Bladder: Decompressed with Foley catheter. IMPRESSION: No hydronephrosis. Electronically Signed   By: Lovey Newcomer M.D.   On: 09/08/2018 14:14   Dg Chest Port 1 View  Result Date: 09/07/2018 CLINICAL DATA:  Weakness EXAM: PORTABLE CHEST 1 VIEW COMPARISON:  07/27/2018 FINDINGS: Cardiac shadow within normal limits. Defibrillator is again seen and stable. Lungs are hypoinflated but clear. No bony abnormality is noted. IMPRESSION: No active disease. Electronically Signed   By: Inez Catalina M.D.   On: 09/07/2018 23:12    Medications:  Prior to Admission:  Medications Prior to Admission  Medication Sig Dispense Refill Last Dose  . acetaminophen (TYLENOL) 500 MG tablet Take 1,000 mg by mouth daily as needed for moderate pain or headache.      . albuterol (PROAIR HFA) 108 (90 BASE) MCG/ACT inhaler Inhale 2 puffs into the lungs every 6 (six) hours as needed for wheezing or shortness of breath.      . allopurinol (ZYLOPRIM) 300 MG tablet Take 300 mg by mouth daily.     09/07/2018 at 0900  . alum & mag hydroxide-simeth (MAALOX/MYLANTA) 200-200-20 MG/5ML suspension Take 15 mLs by mouth every 6 (six) hours as needed for indigestion or heartburn.     Marland Kitchen atorvastatin (LIPITOR) 20 MG tablet Take 20 mg by mouth at bedtime.   09/07/2018 at 2100   . cholecalciferol (VITAMIN D) 1000 units tablet Take 1,000 Units by mouth daily.   09/07/2018 at 0900  . fluticasone (FLONASE) 50 MCG/ACT nasal spray Place 2 sprays into both nostrils daily.      . furosemide (LASIX) 40 MG tablet Take 20-40 mg by mouth every morning. Patient takes 50m in the morning and 249min the evening   09/07/2018 at 2100  . gabapentin (NEURONTIN) 400 MG capsule Take 400 mg by mouth daily.  09/07/2018 at 0900  . hydrALAZINE (APRESOLINE) 100 MG tablet Take 1 tablet (100 mg total) by mouth 3 (three) times daily. 90 tablet 3 09/07/2018 at 2100  . HYDROcodone-acetaminophen (NORCO/VICODIN) 5-325 MG tablet Take 1 tablet by mouth every 6 (six) hours as needed.   09/07/2018 at Unknown time  . insulin lispro (HUMALOG KWIKPEN) 100 UNIT/ML KiwkPen Inject 18-20 Units into the skin as directed. Inject 2 to 18 units based on sliding scale as directed as needed for blood glucose over 200   Past Week at Unknown time  . isosorbide mononitrate (IMDUR) 30 MG 24 hr tablet TAKE 1 TABLET DAILY 90 tablet 0 09/07/2018 at 0900  . LANTUS SOLOSTAR 100 UNIT/ML Solostar Pen Inject 20 Units into the skin daily.    09/07/2018 at 0900  . linaclotide (LINZESS) 145 MCG CAPS capsule Take 1 capsule (145 mcg total) by mouth daily before breakfast. 30 capsule 5 09/07/2018 at 0900  . loratadine (CLARITIN) 10 MG tablet Take 10 mg by mouth daily.   09/07/2018 at 0900  . metoCLOPramide (REGLAN) 5 MG tablet Take 5 mg by mouth 3 (three) times daily before meals.   09/07/2018 at 2100  . metoprolol succinate (TOPROL-XL) 100 MG 24 hr tablet Take 1 tablet (100 mg total) by mouth 2 (two) times a day. Take with or immediately following a meal. 180 tablet 1 09/07/2018 at 2100  . Multiple Vitamin (MULTIVITAMIN WITH MINERALS) TABS tablet Take 1 tablet by mouth daily.   09/07/2018 at 0900  . ondansetron (ZOFRAN) 4 MG tablet Take 1 tablet (4 mg total) by mouth every 8 (eight) hours as needed for nausea or vomiting. 30 tablet 1   . potassium chloride SA  (K-DUR) 20 MEQ tablet Take 1 tablet (20 mEq total) by mouth 2 (two) times daily. (Patient taking differently: Take 20 mEq by mouth daily. ) 60 tablet 5 09/07/2018 at 0900  . tamsulosin (FLOMAX) 0.4 MG CAPS capsule Take 1 capsule by mouth daily.   09/07/2018 at 0900  . traZODone (DESYREL) 50 MG tablet Take 0.5 tablets (25 mg total) by mouth at bedtime as needed for sleep. 30 tablet 5   . vitamin C (ASCORBIC ACID) 500 MG tablet Take 500 mg by mouth 2 (two) times daily.   09/07/2018 at 2100   Scheduled: . allopurinol  300 mg Oral Daily  . atorvastatin  20 mg Oral QHS  . cholecalciferol  1,000 Units Oral Daily  . fluticasone  2 spray Each Nare Daily  . furosemide  40 mg Oral [id number]  . gabapentin  400 mg Oral Daily  . heparin  5,000 Units Subcutaneous Q8H  . hydrALAZINE  100 mg Oral TID  . insulin aspart  0-5 Units Subcutaneous QHS  . insulin aspart  0-9 Units Subcutaneous TID WC  . insulin glargine  20 Units Subcutaneous Daily  . isosorbide mononitrate  30 mg Oral Daily  . linaclotide  145 mcg Oral QAC breakfast  . loratadine  10 mg Oral Daily  . metoCLOPramide  5 mg Oral TID AC  . metoprolol succinate  100 mg Oral BID  . multivitamin with minerals  1 tablet Oral Daily  . pantoprazole  40 mg Oral Daily  . potassium chloride SA  20 mEq Oral BID  . tamsulosin  0.4 mg Oral Daily  . vitamin C  500 mg Oral BID   Continuous: . cefTRIAXone (ROCEPHIN)  IV 1 g (09/08/18 2302)   DXA:JOINOMVEHMCNO **OR** acetaminophen, albuterol, HYDROcodone-acetaminophen, ondansetron, traZODone  Assesment: He was  admitted with acute on chronic renal failure.  His renal function has improved but is not back to baseline yet.  He has chronic systolic heart failure from nonischemic cardiomyopathy so we have to be careful with IV fluids but he continues to need fluids at this point.  I think he is dehydrated.  He has hypertension at baseline.  He has had trouble with urinary retention and has a Foley catheter  He  has significant deconditioning and will require skilled care facility at discharge Principal Problem:   ARF (acute renal failure) ([place]) Active Problems:   GERD (gastroesophageal reflux disease)   COPD (chronic obstructive pulmonary disease) (HCC)   DM (diabetes mellitus) (HCC)   HTN (hypertension)   Chronic systolic CHF (congestive heart failure) (HCC)   NICM (nonischemic cardiomyopathy) ([place])   Acute lower UTI   Pressure injury of skin   Acute renal failure (ARF) ([place])    Plan: Continue IV fluids.  Recheck labs tomorrow.  Not ready for transfer to skilled care facility yet    LOS: 1 day   Alonza Bogus 09/09/2018, 8:31 AM

## 2018-09-10 LAB — URINE CULTURE: Culture: >=100000 — AB

## 2018-09-10 LAB — GLUCOSE, CAPILLARY
Glucose-Capillary: 210 mg/dL — ABNORMAL HIGH (ref 70–99)
Glucose-Capillary: 246 mg/dL — ABNORMAL HIGH (ref 70–99)
Glucose-Capillary: 290 mg/dL — ABNORMAL HIGH (ref 70–99)
Glucose-Capillary: 294 mg/dL — ABNORMAL HIGH (ref 70–99)

## 2018-09-10 LAB — BASIC METABOLIC PANEL
Anion gap: 12 (ref 5–15)
BUN: 34 mg/dL — ABNORMAL HIGH (ref 8–23)
CO2: 23 mmol/L (ref 22–32)
Calcium: 9.1 mg/dL (ref 8.9–10.3)
Chloride: 96 mmol/L — ABNORMAL LOW (ref 98–111)
Creatinine, Ser: 1.72 mg/dL — ABNORMAL HIGH (ref 0.61–1.24)
GFR calc Af Amer: 47 mL/min — ABNORMAL LOW (ref >60)
GFR calc non Af Amer: 41 mL/min — ABNORMAL LOW (ref >60)
Glucose, Bld: 254 mg/dL — ABNORMAL HIGH (ref 70–99)
Potassium: 3.5 mmol/L (ref 3.5–5.1)
Sodium: 131 mmol/L — ABNORMAL LOW (ref 135–145)

## 2018-09-10 MED ORDER — INSULIN ASPART 100 UNIT/ML ~~LOC~~ SOLN
0.0000 [IU] | Freq: Three times a day (TID) | SUBCUTANEOUS | Status: DC
  Administered 2018-09-10: 7 [IU] via SUBCUTANEOUS
  Administered 2018-09-10: 11 [IU] via SUBCUTANEOUS
  Administered 2018-09-11 – 2018-09-12 (×5): 7 [IU] via SUBCUTANEOUS
  Administered 2018-09-12: 4 [IU] via SUBCUTANEOUS

## 2018-09-10 MED ORDER — INSULIN GLARGINE 100 UNIT/ML ~~LOC~~ SOLN
25.0000 [IU] | Freq: Every day | SUBCUTANEOUS | Status: DC
  Administered 2018-09-10 – 2018-09-12 (×4): 25 [IU] via SUBCUTANEOUS
  Filled 2018-09-10 (×5): qty 0.25

## 2018-09-10 MED ORDER — FOSFOMYCIN TROMETHAMINE 3 G PO PACK
3.0000 g | PACK | Freq: Once | ORAL | Status: AC
  Administered 2018-09-10: 12:00:00 3 g via ORAL
  Filled 2018-09-10: qty 3

## 2018-09-10 MED ORDER — INSULIN ASPART 100 UNIT/ML ~~LOC~~ SOLN
0.0000 [IU] | Freq: Every day | SUBCUTANEOUS | Status: DC
  Administered 2018-09-10: 3 [IU] via SUBCUTANEOUS
  Administered 2018-09-11: 2 [IU] via SUBCUTANEOUS

## 2018-09-10 NOTE — Progress Notes (Signed)
Subjective: He says he feels some better.  He has no new complaints.  His breathing is okay.  He is still weak.  Objective: Vital signs in last 24 hours: Temp:  [98.2 F (36.8 C)-99.5 F (37.5 C)] 99.5 F (37.5 C) (07/09 0532) Pulse Rate:  [74-109] 94 (07/09 0532) Resp:  [16-18] 18 (07/09 0532) BP: (102-128)/(57-88) 128/88 (07/09 0532) SpO2:  [94 %-100 %] 99 % (07/09 0532) Weight:  [98.7 kg] 98.7 kg (07/09 0500) Weight change: 1.9 kg Last BM Date: (pt unable to state)  Intake/Output from previous day: 07/08 0701 - 07/09 0700 In: 1962.7 [P.O.:720; I.V.:1008.6; IV Piggyback:234.1] Out: 2000 [Urine:1200]  PHYSICAL EXAM General appearance: alert, cooperative and no distress Resp: clear to auscultation bilaterally Cardio: regular rate and rhythm, S1, S2 normal, no murmur, click, rub or gallop GI: soft, non-tender; bowel sounds normal; no masses,  no organomegaly Extremities: extremities normal, atraumatic, no cyanosis or edema  Lab Results:  Results for orders placed or performed during the hospital encounter of 09/07/18 (from the past 48 hour(s))  Glucose, capillary     Status: Abnormal   Collection Time: 09/08/18 11:20 AM  Result Value Ref Range   Glucose-Capillary 266 (H) 70 - 99 mg/dL  Glucose, capillary     Status: Abnormal   Collection Time: 09/08/18  4:28 PM  Result Value Ref Range   Glucose-Capillary 228 (H) 70 - 99 mg/dL  Glucose, capillary     Status: Abnormal   Collection Time: 09/08/18  8:47 PM  Result Value Ref Range   Glucose-Capillary 237 (H) 70 - 99 mg/dL  Glucose, capillary     Status: Abnormal   Collection Time: 09/09/18  7:46 AM  Result Value Ref Range   Glucose-Capillary 219 (H) 70 - 99 mg/dL  Glucose, capillary     Status: Abnormal   Collection Time: 09/09/18 11:03 AM  Result Value Ref Range   Glucose-Capillary 263 (H) 70 - 99 mg/dL  Glucose, capillary     Status: Abnormal   Collection Time: 09/09/18  4:02 PM  Result Value Ref Range    Glucose-Capillary 268 (H) 70 - 99 mg/dL  Glucose, capillary     Status: Abnormal   Collection Time: 09/09/18  8:27 PM  Result Value Ref Range   Glucose-Capillary 256 (H) 70 - 99 mg/dL   Comment 1 Notify RN   Basic metabolic panel     Status: Abnormal   Collection Time: 09/10/18  5:00 AM  Result Value Ref Range   Sodium 131 (L) 135 - 145 mmol/L   Potassium 3.5 3.5 - 5.1 mmol/L   Chloride 96 (L) 98 - 111 mmol/L   CO2 23 22 - 32 mmol/L   Glucose, Bld 254 (H) 70 - 99 mg/dL   BUN 34 (H) 8 - 23 mg/dL   Creatinine, Ser 1.72 (H) 0.61 - 1.24 mg/dL   Calcium 9.1 8.9 - 10.3 mg/dL   GFR calc non Af Amer 41 (L) >60 mL/min   GFR calc Af Amer 47 (L) >60 mL/min   Anion gap 12 5 - 15    Comment: Performed at [facility], [address] [place], [place] [address]  Glucose, capillary     Status: Abnormal   Collection Time: 09/10/18  7:32 AM  Result Value Ref Range   Glucose-Capillary 210 (H) 70 - 99 mg/dL    ABGS No results for input(s): PHART, PO2ART, TCO2, HCO3 in the last 72 hours.  Invalid input(s): PCO2 CULTURES Recent Results (from the past 240  hour(s))  Urine culture     Status: Abnormal   Collection Time: 09/07/18 11:46 PM   Specimen: Urine, Random  Result Value Ref Range Status   Specimen Description   Final    URINE, RANDOM Performed at [facility], [address] [place], [place] [address]    Special Requests   Final    NONE Performed at [facility], [address], [place], [place] [address]    Culture (A)  Final    >=100,000 COLONIES/mL ESCHERICHIA COLI Confirmed Extended Spectrum Beta-Lactamase Producer (ESBL).  In bloodstream infections from ESBL organisms, carbapenems are preferred over piperacillin/tazobactam. They are shown to have a lower risk of mortality.    Report Status 09/10/2018 FINAL  Final   Organism ID, Bacteria ESCHERICHIA COLI (A)  Final      Susceptibility   Escherichia coli - MIC*    AMPICILLIN >=32 RESISTANT Resistant     CEFAZOLIN 16 SENSITIVE  Sensitive     CEFTRIAXONE <=1 SENSITIVE Sensitive     CIPROFLOXACIN >=4 RESISTANT Resistant     GENTAMICIN <=1 SENSITIVE Sensitive     IMIPENEM <=0.25 SENSITIVE Sensitive     NITROFURANTOIN <=16 SENSITIVE Sensitive     TRIMETH/SULFA >=320 RESISTANT Resistant     AMPICILLIN/SULBACTAM >=32 RESISTANT Resistant     PIP/TAZO >=128 RESISTANT Resistant     Extended ESBL POSITIVE Resistant     * >=100,000 COLONIES/mL ESCHERICHIA COLI  SARS Coronavirus 2 (CEPHEID - Performed in [place] hospital lab), Hosp Order     Status: None   Collection Time: 09/07/18 11:53 PM   Specimen: Nasopharyngeal Swab  Result Value Ref Range Status   SARS Coronavirus 2 NEGATIVE NEGATIVE Final    Comment: (NOTE) If result is NEGATIVE SARS-CoV-2 target nucleic acids are NOT DETECTED. The SARS-CoV-2 RNA is generally detectable in upper and lower  respiratory specimens during the acute phase of infection. The lowest  concentration of SARS-CoV-2 viral copies this assay can detect is 250  copies / mL. A negative result does not preclude SARS-CoV-2 infection  and should not be used as the sole basis for treatment or other  patient management decisions.  A negative result may occur with  improper specimen collection / handling, submission of specimen other  than nasopharyngeal swab, presence of viral mutation(s) within the  areas targeted by this assay, and inadequate number of viral copies  (<250 copies / mL). A negative result must be combined with clinical  observations, patient history, and epidemiological information. If result is POSITIVE SARS-CoV-2 target nucleic acids are DETECTED. The SARS-CoV-2 RNA is generally detectable in upper and lower  respiratory specimens dur ing the acute phase of infection.  Positive  results are indicative of active infection with SARS-CoV-2.  Clinical  correlation with patient history and other diagnostic information is  necessary to determine patient infection status.   Positive results do  not rule out bacterial infection or co-infection with other viruses. If result is PRESUMPTIVE POSTIVE SARS-CoV-2 nucleic acids MAY BE PRESENT.   A presumptive positive result was obtained on the submitted specimen  and confirmed on repeat testing.  While 2019 novel coronavirus  (SARS-CoV-2) nucleic acids may be present in the submitted sample  additional confirmatory testing may be necessary for epidemiological  and / or clinical management purposes  to differentiate between  SARS-CoV-2 and other Sarbecovirus currently known to infect humans.  If clinically indicated additional testing with an alternate test  methodology [phone]) is advised. The SARS-CoV-2 RNA is generally  detectable in upper and lower respiratory sp ecimens during the acute  phase of infection. The expected result is Negative. Fact Sheet for Patients:  StrictlyIdeas.no Fact Sheet for Healthcare Providers: BankingDealers.co.za This test is not yet approved or cleared by the [place] FDA and has been authorized for detection and/or diagnosis of SARS-CoV-2 by FDA under an Emergency Use Authorization (EUA).  This EUA will remain in effect (meaning this test can be used) for the duration of the COVID-19 declaration under Section 564(b)(1) of the Act, 21 U.S.C. section 360bbb-3(b)(1), unless the authorization is terminated or revoked sooner. Performed at [facility], [address], [place], [place] [address]    Studies/Results: US Renal  Result Date: 09/08/2018 CLINICAL DATA:  Acute renal failure. EXAM: RENAL / URINARY TRACT ULTRASOUND COMPLETE COMPARISON:  None. FINDINGS: Right Kidney: Renal measurements: 7.9 x 4.6 x 5.3 cm = volume: 99.8 mL . Echogenicity within normal limits. No mass or hydronephrosis visualized. Left Kidney: Renal measurements: 8.7 x 4.7 x 5.6 cm = volume: 120.0 mL. Echogenicity within normal limits. No mass or hydronephrosis  visualized. Bladder: Decompressed with Foley catheter. IMPRESSION: No hydronephrosis. Electronically Signed   By: Lovey Newcomer M.D.   On: 09/08/2018 14:14    Medications:  Prior to Admission:  Medications Prior to Admission  Medication Sig Dispense Refill Last Dose  . acetaminophen (TYLENOL) 500 MG tablet Take 1,000 mg by mouth daily as needed for moderate pain or headache.      . albuterol (PROAIR HFA) 108 (90 BASE) MCG/ACT inhaler Inhale 2 puffs into the lungs every 6 (six) hours as needed for wheezing or shortness of breath.      . allopurinol (ZYLOPRIM) 300 MG tablet Take 300 mg by mouth daily.     09/07/2018 at 0900  . alum & mag hydroxide-simeth (MAALOX/MYLANTA) 200-200-20 MG/5ML suspension Take 15 mLs by mouth every 6 (six) hours as needed for indigestion or heartburn.     Marland Kitchen atorvastatin (LIPITOR) 20 MG tablet Take 20 mg by mouth at bedtime.   09/07/2018 at 2100  . cholecalciferol (VITAMIN D) 1000 units tablet Take 1,000 Units by mouth daily.   09/07/2018 at 0900  . fluticasone (FLONASE) 50 MCG/ACT nasal spray Place 2 sprays into both nostrils daily.      . furosemide (LASIX) 40 MG tablet Take 20-40 mg by mouth every morning. Patient takes 38m in the morning and 232min the evening   09/07/2018 at 2100  . gabapentin (NEURONTIN) 400 MG capsule Take 400 mg by mouth daily.    09/07/2018 at 0900  . hydrALAZINE (APRESOLINE) 100 MG tablet Take 1 tablet (100 mg total) by mouth 3 (three) times daily. 90 tablet 3 09/07/2018 at 2100  . HYDROcodone-acetaminophen (NORCO/VICODIN) 5-325 MG tablet Take 1 tablet by mouth every 6 (six) hours as needed.   09/07/2018 at Unknown time  . insulin lispro (HUMALOG KWIKPEN) 100 UNIT/ML KiwkPen Inject 18-20 Units into the skin as directed. Inject 2 to 18 units based on sliding scale as directed as needed for blood glucose over 200   Past Week at Unknown time  . isosorbide mononitrate (IMDUR) 30 MG 24 hr tablet TAKE 1 TABLET DAILY 90 tablet 0 09/07/2018 at 0900  . LANTUS SOLOSTAR  100 UNIT/ML Solostar Pen Inject 20 Units into the skin daily.    09/07/2018 at 0900  . linaclotide (LINZESS) 145 MCG CAPS capsule Take 1 capsule (145 mcg total) by mouth daily before breakfast. 30 capsule 5 09/07/2018 at 0900  . loratadine (  CLARITIN) 10 MG tablet Take 10 mg by mouth daily.   09/07/2018 at 0900  . metoCLOPramide (REGLAN) 5 MG tablet Take 5 mg by mouth 3 (three) times daily before meals.   09/07/2018 at 2100  . metoprolol succinate (TOPROL-XL) 100 MG 24 hr tablet Take 1 tablet (100 mg total) by mouth 2 (two) times a day. Take with or immediately following a meal. 180 tablet 1 09/07/2018 at 2100  . Multiple Vitamin (MULTIVITAMIN WITH MINERALS) TABS tablet Take 1 tablet by mouth daily.   09/07/2018 at 0900  . ondansetron (ZOFRAN) 4 MG tablet Take 1 tablet (4 mg total) by mouth every 8 (eight) hours as needed for nausea or vomiting. 30 tablet 1   . potassium chloride SA (K-DUR) 20 MEQ tablet Take 1 tablet (20 mEq total) by mouth 2 (two) times daily. (Patient taking differently: Take 20 mEq by mouth daily. ) 60 tablet 5 09/07/2018 at 0900  . tamsulosin (FLOMAX) 0.4 MG CAPS capsule Take 1 capsule by mouth daily.   09/07/2018 at 0900  . traZODone (DESYREL) 50 MG tablet Take 0.5 tablets (25 mg total) by mouth at bedtime as needed for sleep. 30 tablet 5   . vitamin C (ASCORBIC ACID) 500 MG tablet Take 500 mg by mouth 2 (two) times daily.   09/07/2018 at 2100   Scheduled: . allopurinol  300 mg Oral Daily  . atorvastatin  20 mg Oral QHS  . cholecalciferol  1,000 Units Oral Daily  . docusate sodium  200 mg Oral QHS  . fluticasone  2 spray Each Nare Daily  . gabapentin  400 mg Oral Daily  . heparin  5,000 Units Subcutaneous Q8H  . hydrALAZINE  100 mg Oral TID  . insulin aspart  0-20 Units Subcutaneous TID WC  . insulin aspart  0-5 Units Subcutaneous QHS  . insulin glargine  25 Units Subcutaneous Daily  . isosorbide mononitrate  30 mg Oral Daily  . linaclotide  145 mcg Oral QAC breakfast  . loratadine  10  mg Oral Daily  . metoCLOPramide  5 mg Oral TID AC  . metoprolol succinate  100 mg Oral BID  . multivitamin with minerals  1 tablet Oral Daily  . pantoprazole  40 mg Oral Daily  . potassium chloride SA  20 mEq Oral BID  . tamsulosin  0.4 mg Oral Daily  . vitamin C  500 mg Oral BID   Continuous: . sodium chloride 50 mL/hr at 09/10/18 0327  . cefTRIAXone (ROCEPHIN)  IV 1 g (09/09/18 2254)   CBU:LAGTXMIWOEHOZ **OR** acetaminophen, albuterol, HYDROcodone-acetaminophen, ondansetron, traZODone  Assesment: He was admitted with acute on chronic renal failure.  His renal function is better but not back to baseline yet.  We will plan to continue IV fluids.  He has chronic systolic heart failure from nonischemic cardiomyopathy so we have to be careful with his IV fluids but I think we need to get his renal function improved.  I do think he was dehydrated on admission  He has hypertension which is pretty well controlled  He has diabetes on sliding scale and his blood sugar is still high so I have adjusted his meds  He has stable COPD Principal Problem:   ARF (acute renal failure) (HCC) Active Problems:   GERD (gastroesophageal reflux disease)   COPD (chronic obstructive pulmonary disease) (HCC)   DM (diabetes mellitus) ([place])   HTN (hypertension)   Chronic systolic CHF (congestive heart failure) (HCC)   NICM (nonischemic cardiomyopathy) ([place])  Acute lower UTI   Pressure injury of skin   Acute renal failure (ARF) ([place])    Plan: Continue current treatments.  Potentially to skilled care facility tomorrow depending on renal function.  However today he says that he may want to go home instead.    LOS: 2 days   Alonza Bogus 09/10/2018, 8:37 AM

## 2018-09-10 NOTE — Plan of Care (Signed)
  Problem: Education: Goal: Knowledge of General Education information will improve Description: Including pain rating scale, medication(s)/side effects and non-pharmacologic comfort measures Outcome: Progressing   Problem: Clinical Measurements: Goal: Diagnostic test results will improve Outcome: Progressing   

## 2018-09-11 LAB — BASIC METABOLIC PANEL
Anion gap: 11 (ref 5–15)
BUN: 28 mg/dL — ABNORMAL HIGH (ref 8–23)
CO2: 20 mmol/L — ABNORMAL LOW (ref 22–32)
Calcium: 9.2 mg/dL (ref 8.9–10.3)
Chloride: 103 mmol/L (ref 98–111)
Creatinine, Ser: 1.49 mg/dL — ABNORMAL HIGH (ref 0.61–1.24)
GFR calc Af Amer: 56 mL/min — ABNORMAL LOW (ref >60)
GFR calc non Af Amer: 48 mL/min — ABNORMAL LOW (ref >60)
Glucose, Bld: 268 mg/dL — ABNORMAL HIGH (ref 70–99)
Potassium: 3.6 mmol/L (ref 3.5–5.1)
Sodium: 134 mmol/L — ABNORMAL LOW (ref 135–145)

## 2018-09-11 LAB — GLUCOSE, CAPILLARY
Glucose-Capillary: 234 mg/dL — ABNORMAL HIGH (ref 70–99)
Glucose-Capillary: 262 mg/dL — ABNORMAL HIGH (ref 70–99)
Glucose-Capillary: 249 mg/dL — ABNORMAL HIGH (ref 70–99)
Glucose-Capillary: 237 mg/dL — ABNORMAL HIGH (ref 70–99)

## 2018-09-11 NOTE — Progress Notes (Signed)
Subjective: He says he feels okay.  He now says he is going to go home.  We discussed that and I told him I did not think that was in his best interest considering that he is done that the last 3 times it has been in the hospital and he has fairly rapidly had to come back.  It also sounds like the home situation is worse.  He says he will think about it.  Discussed with pharmacy and they said we could give him another dose of fosfomycin tomorrow which may take care of his urinary tract infection.  If he agrees to go to a skilled care facility we could delay that by 24 hours or so.  Objective: Vital signs in last 24 hours: Temp:  [98 F (36.7 C)-98.6 F (37 C)] 98 F (36.7 C) (07/10 0452) Pulse Rate:  [43-103] 103 (07/10 0452) Resp:  [18] 18 (07/10 0452) BP: (103-124)/(67-82) 124/82 (07/10 0804) SpO2:  [95 %-100 %] 95 % (07/10 0452) Weight:  [98.7 kg] 98.7 kg (07/10 0459) Weight change: 0 kg Last BM Date: 09/10/18  Intake/Output from previous day: 07/09 0701 - 07/10 0700 In: 2108.3 [P.O.:960; I.V.:1148.3] Out: 600 [Urine:600]  PHYSICAL EXAM General appearance: alert and no distress Resp: clear to auscultation bilaterally Cardio: regular rate and rhythm, S1, S2 normal, no murmur, click, rub or gallop GI: soft, non-tender; bowel sounds normal; no masses,  no organomegaly Extremities: extremities normal, atraumatic, no cyanosis or edema  Lab Results:  Results for orders placed or performed during the hospital encounter of 09/07/18 (from the past 48 hour(s))  Glucose, capillary     Status: Abnormal   Collection Time: 09/09/18 11:03 AM  Result Value Ref Range   Glucose-Capillary 263 (H) 70 - 99 mg/dL  Glucose, capillary     Status: Abnormal   Collection Time: 09/09/18  4:02 PM  Result Value Ref Range   Glucose-Capillary 268 (H) 70 - 99 mg/dL  Glucose, capillary     Status: Abnormal   Collection Time: 09/09/18  8:27 PM  Result Value Ref Range   Glucose-Capillary 256 (H) 70 - 99  mg/dL   Comment 1 Notify RN   Basic metabolic panel     Status: Abnormal   Collection Time: 09/10/18  5:00 AM  Result Value Ref Range   Sodium 131 (L) 135 - 145 mmol/L   Potassium 3.5 3.5 - 5.1 mmol/L   Chloride 96 (L) 98 - 111 mmol/L   CO2 23 22 - 32 mmol/L   Glucose, Bld 254 (H) 70 - 99 mg/dL   BUN 34 (H) 8 - 23 mg/dL   Creatinine, Ser 1.72 (H) 0.61 - 1.24 mg/dL   Calcium 9.1 8.9 - 10.3 mg/dL   GFR calc non Af Amer 41 (L) >60 mL/min   GFR calc Af Amer 47 (L) >60 mL/min   Anion gap 12 5 - 15    Comment: Performed at [facility], [address] [place], [place] [address]  Glucose, capillary     Status: Abnormal   Collection Time: 09/10/18  7:32 AM  Result Value Ref Range   Glucose-Capillary 210 (H) 70 - 99 mg/dL  Glucose, capillary     Status: Abnormal   Collection Time: 09/10/18 11:22 AM  Result Value Ref Range   Glucose-Capillary 246 (H) 70 - 99 mg/dL  Glucose, capillary     Status: Abnormal   Collection Time: 09/10/18  4:33 PM  Result Value Ref Range   Glucose-Capillary 290 (H) 70 -  99 mg/dL  Glucose, capillary     Status: Abnormal   Collection Time: 09/10/18  8:52 PM  Result Value Ref Range   Glucose-Capillary 294 (H) 70 - 99 mg/dL   Comment 1 Notify RN   Basic metabolic panel     Status: Abnormal   Collection Time: 09/11/18  4:43 AM  Result Value Ref Range   Sodium 134 (L) 135 - 145 mmol/L   Potassium 3.6 3.5 - 5.1 mmol/L   Chloride 103 98 - 111 mmol/L   CO2 20 (L) 22 - 32 mmol/L   Glucose, Bld 268 (H) 70 - 99 mg/dL   BUN 28 (H) 8 - 23 mg/dL   Creatinine, Ser 1.49 (H) 0.61 - 1.24 mg/dL   Calcium 9.2 8.9 - 10.3 mg/dL   GFR calc non Af Amer 48 (L) >60 mL/min   GFR calc Af Amer 56 (L) >60 mL/min   Anion gap 11 5 - 15    Comment: Performed at [facility], [address] [place], [place] [address]  Glucose, capillary     Status: Abnormal   Collection Time: 09/11/18  7:23 AM  Result Value Ref Range   Glucose-Capillary 234 (H) 70 - 99 mg/dL    ABGS No results  for input(s): PHART, PO2ART, TCO2, HCO3 in the last 72 hours.  Invalid input(s): PCO2 CULTURES Recent Results (from the past 240 hour(s))  Urine culture     Status: Abnormal   Collection Time: 09/07/18 11:46 PM   Specimen: Urine, Random  Result Value Ref Range Status   Specimen Description URINE, RANDOM  Final   Special Requests NONE  Final   Culture (A)  Final    >=100,000 COLONIES/mL ESCHERICHIA COLI Confirmed Extended Spectrum Beta-Lactamase Producer (ESBL).  In bloodstream infections from ESBL organisms, carbapenems are preferred over piperacillin/tazobactam. They are shown to have a lower risk of mortality.    Report Status 09/10/2018 FINAL  Final   Organism ID, Bacteria ESCHERICHIA COLI (A)  Final      Susceptibility   Escherichia coli - MIC*    AMPICILLIN >=32 RESISTANT Resistant     CEFAZOLIN Value in next row Resistant      RESISTANTCORRECTED ON 09/10/2018 at 0849: PREVIOUSLY REPORTED AS 16 SS    CEFTRIAXONE Value in next row Resistant      RESISTANTCORRECTED ON 09/10/2018 at 0849: PREVIOUSLY REPORTED AS <=1 SS    CIPROFLOXACIN Value in next row Resistant      RESISTANTCORRECTED ON 09/10/2018 at 0849: PREVIOUSLY REPORTED AS <=1 SS    GENTAMICIN Value in next row Sensitive      RESISTANTCORRECTED ON 09/10/2018 at 0849: PREVIOUSLY REPORTED AS <=1 SS    IMIPENEM Value in next row Sensitive      RESISTANTCORRECTED ON 09/10/2018 at 0849: PREVIOUSLY REPORTED AS <=1 SS    NITROFURANTOIN Value in next row Sensitive      RESISTANTCORRECTED ON 09/10/2018 at 0849: PREVIOUSLY REPORTED AS <=1 SS    TRIMETH/SULFA Value in next row Resistant      RESISTANTCORRECTED ON 09/10/2018 at 0849: PREVIOUSLY REPORTED AS <=1 SS    AMPICILLIN/SULBACTAM Value in next row Resistant      RESISTANTCORRECTED ON 09/10/2018 at 0849: PREVIOUSLY REPORTED AS <=1 SS    PIP/TAZO Value in next row Resistant      RESISTANTCORRECTED ON 09/10/2018 at 0849: PREVIOUSLY REPORTED AS <=1 SS    Extended ESBL Value in  next row Resistant      POSITIVEPerformed at [facility], [address] [place], [place] [address]    * >=100,000 COLONIES/mL ESCHERICHIA COLI  SARS Coronavirus 2 (CEPHEID - Performed in [place] hospital lab), Hosp Order     Status: None   Collection Time: 09/07/18 11:53 PM   Specimen: Nasopharyngeal Swab  Result Value Ref Range Status   SARS Coronavirus 2 NEGATIVE NEGATIVE Final    Comment: (NOTE) If result is NEGATIVE SARS-CoV-2 target nucleic acids are NOT DETECTED. The SARS-CoV-2 RNA is generally detectable in upper and lower  respiratory specimens during the acute phase of infection. The lowest  concentration of SARS-CoV-2 viral copies this assay can detect is 250  copies / mL. A negative result does not preclude SARS-CoV-2 infection  and should not be used as the sole basis for treatment or other  patient management decisions.  A negative result may occur with  improper specimen collection / handling, submission of specimen other  than nasopharyngeal swab, presence of viral mutation(s) within the  areas targeted by this assay, and inadequate number of viral copies  (<250 copies / mL). A negative result must be combined with clinical  observations, patient history, and epidemiological information. If result is POSITIVE SARS-CoV-2 target nucleic acids are DETECTED. The SARS-CoV-2 RNA is generally detectable in upper and lower  respiratory specimens dur ing the acute phase of infection.  Positive  results are indicative of active infection with SARS-CoV-2.  Clinical  correlation with patient history and other diagnostic information is  necessary to determine patient infection status.  Positive results do  not rule out bacterial infection or co-infection with other viruses. If result is PRESUMPTIVE POSTIVE SARS-CoV-2 nucleic acids MAY BE PRESENT.   A presumptive positive result was obtained on the submitted specimen  and confirmed on repeat testing.  While 2019 novel  coronavirus  (SARS-CoV-2) nucleic acids may be present in the submitted sample  additional confirmatory testing may be necessary for epidemiological  and / or clinical management purposes  to differentiate between  SARS-CoV-2 and other Sarbecovirus currently known to infect humans.  If clinically indicated additional testing with an alternate test  methodology [phone]) is advised. The SARS-CoV-2 RNA is generally  detectable in upper and lower respiratory sp ecimens during the acute  phase of infection. The expected result is Negative. Fact Sheet for Patients:  StrictlyIdeas.no Fact Sheet for Healthcare Providers: BankingDealers.co.za This test is not yet approved or cleared by the [place] FDA and has been authorized for detection and/or diagnosis of SARS-CoV-2 by FDA under an Emergency Use Authorization (EUA).  This EUA will remain in effect (meaning this test can be used) for the duration of the COVID-19 declaration under Section 564(b)(1) of the Act, 21 U.S.C. section 360bbb-3(b)(1), unless the authorization is terminated or revoked sooner. Performed at [facility], [address], [place], [place] [address]    Studies/Results: No results found.  Medications:  Prior to Admission:  Medications Prior to Admission  Medication Sig Dispense Refill Last Dose  . acetaminophen (TYLENOL) 500 MG tablet Take 1,000 mg by mouth daily as needed for moderate pain or headache.      . albuterol (PROAIR HFA) 108 (90 BASE) MCG/ACT inhaler Inhale 2 puffs into the lungs every 6 (six) hours as needed for wheezing or shortness of breath.      . allopurinol (ZYLOPRIM) 300 MG tablet Take 300 mg by mouth daily.     09/07/2018 at 0900  . alum & mag hydroxide-simeth (MAALOX/MYLANTA) 200-200-20 MG/5ML suspension Take 15 mLs by mouth every 6 (  six) hours as needed for indigestion or heartburn.     Marland Kitchen atorvastatin (LIPITOR) 20 MG tablet Take 20 mg by mouth at  bedtime.   09/07/2018 at 2100  . cholecalciferol (VITAMIN D) 1000 units tablet Take 1,000 Units by mouth daily.   09/07/2018 at 0900  . fluticasone (FLONASE) 50 MCG/ACT nasal spray Place 2 sprays into both nostrils daily.      . furosemide (LASIX) 40 MG tablet Take 20-40 mg by mouth every morning. Patient takes 81m in the morning and 240min the evening   09/07/2018 at 2100  . gabapentin (NEURONTIN) 400 MG capsule Take 400 mg by mouth daily.    09/07/2018 at 0900  . hydrALAZINE (APRESOLINE) 100 MG tablet Take 1 tablet (100 mg total) by mouth 3 (three) times daily. 90 tablet 3 09/07/2018 at 2100  . HYDROcodone-acetaminophen (NORCO/VICODIN) 5-325 MG tablet Take 1 tablet by mouth every 6 (six) hours as needed.   09/07/2018 at Unknown time  . insulin lispro (HUMALOG KWIKPEN) 100 UNIT/ML KiwkPen Inject 18-20 Units into the skin as directed. Inject 2 to 18 units based on sliding scale as directed as needed for blood glucose over 200   Past Week at Unknown time  . isosorbide mononitrate (IMDUR) 30 MG 24 hr tablet TAKE 1 TABLET DAILY 90 tablet 0 09/07/2018 at 0900  . LANTUS SOLOSTAR 100 UNIT/ML Solostar Pen Inject 20 Units into the skin daily.    09/07/2018 at 0900  . linaclotide (LINZESS) 145 MCG CAPS capsule Take 1 capsule (145 mcg total) by mouth daily before breakfast. 30 capsule 5 09/07/2018 at 0900  . loratadine (CLARITIN) 10 MG tablet Take 10 mg by mouth daily.   09/07/2018 at 0900  . metoCLOPramide (REGLAN) 5 MG tablet Take 5 mg by mouth 3 (three) times daily before meals.   09/07/2018 at 2100  . metoprolol succinate (TOPROL-XL) 100 MG 24 hr tablet Take 1 tablet (100 mg total) by mouth 2 (two) times a day. Take with or immediately following a meal. 180 tablet 1 09/07/2018 at 2100  . Multiple Vitamin (MULTIVITAMIN WITH MINERALS) TABS tablet Take 1 tablet by mouth daily.   09/07/2018 at 0900  . ondansetron (ZOFRAN) 4 MG tablet Take 1 tablet (4 mg total) by mouth every 8 (eight) hours as needed for nausea or vomiting. 30 tablet  1   . potassium chloride SA (K-DUR) 20 MEQ tablet Take 1 tablet (20 mEq total) by mouth 2 (two) times daily. (Patient taking differently: Take 20 mEq by mouth daily. ) 60 tablet 5 09/07/2018 at 0900  . tamsulosin (FLOMAX) 0.4 MG CAPS capsule Take 1 capsule by mouth daily.   09/07/2018 at 0900  . traZODone (DESYREL) 50 MG tablet Take 0.5 tablets (25 mg total) by mouth at bedtime as needed for sleep. 30 tablet 5   . vitamin C (ASCORBIC ACID) 500 MG tablet Take 500 mg by mouth 2 (two) times daily.   09/07/2018 at 2100   Scheduled: . allopurinol  300 mg Oral Daily  . atorvastatin  20 mg Oral QHS  . cholecalciferol  1,000 Units Oral Daily  . docusate sodium  200 mg Oral QHS  . fluticasone  2 spray Each Nare Daily  . gabapentin  400 mg Oral Daily  . heparin  5,000 Units Subcutaneous Q8H  . hydrALAZINE  100 mg Oral TID  . insulin aspart  0-20 Units Subcutaneous TID WC  . insulin aspart  0-5 Units Subcutaneous QHS  . insulin glargine  25 Units  Subcutaneous Daily  . isosorbide mononitrate  30 mg Oral Daily  . linaclotide  145 mcg Oral QAC breakfast  . loratadine  10 mg Oral Daily  . metoCLOPramide  5 mg Oral TID AC  . metoprolol succinate  100 mg Oral BID  . multivitamin with minerals  1 tablet Oral Daily  . pantoprazole  40 mg Oral Daily  . potassium chloride SA  20 mEq Oral BID  . tamsulosin  0.4 mg Oral Daily  . vitamin C  500 mg Oral BID   Continuous: . sodium chloride 50 mL/hr at 09/10/18 2232   EHU:[id number] **OR** acetaminophen, albuterol, HYDROcodone-acetaminophen, ondansetron, traZODone  Assesment: He was admitted with acute renal failure and was dehydrated on admission.  He has urinary tract infection which is ESBL and was treated with fosfomycin.  His dehydration seems better.  His renal function is approaching baseline.  He has nonischemic cardiomyopathy and chronic systolic heart failure so I am going to discontinue IV fluids today see if he can maintain his hydration and he  will be ready for discharge tomorrow. Principal Problem:   ARF (acute renal failure) (HCC) Active Problems:   GERD (gastroesophageal reflux disease)   COPD (chronic obstructive pulmonary disease) (HCC)   DM (diabetes mellitus) (HCC)   HTN (hypertension)   Chronic systolic CHF (congestive heart failure) (HCC)   NICM (nonischemic cardiomyopathy) ([place])   Acute lower UTI   Pressure injury of skin   Acute renal failure (ARF) ([place])    Plan: As above.  Hopefully he will agree to go to skilled care facility because I believe that is best for him.    LOS: 3 days   Alonza Bogus 09/11/2018, 8:24 AM

## 2018-09-11 NOTE — TOC Transition Note (Signed)
Transition of Care [facility]) - CM/SW Discharge Note   Patient Details  Name: Frank Hoover MRN: [id number] Date of Birth: 04-19-1950  Transition of [facility]) CM/SW Contact:  Trish Mage, LCSW Phone Number: 09/11/2018, 4:39 PM   Clinical Narrative:   NOTE: NEW PHONE NUMBERS  H [phone]  DUKG [phone] Pt did not d/c today as planned.  Perhaps tomorrow.  They are prepared to receive him at [facility].  If he does not d/c tomorrow, will need another COVID test.  Pt and I talked to wife, and despite his ambivalence and tears about missing home, he committed to her that he was going to rehab.  Transport via [employer].    Final next level of care: Skilled Nursing Facility Barriers to Discharge: No Barriers Identified   Patient Goals and CMS Choice Patient states their goals for this hospitalization and ongoing recovery are:: "I'm worried about my son." CMS Medicare.gov Compare Post Acute Care list provided to:: Patient Choice offered to / list presented to : Patient  Discharge Placement   Existing PASRR number confirmed : 09/09/18          Patient chooses bed at: [facility] Patient to be transferred to facility by: [place] Name of family member notified: Ms Diltz Patient and family notified of of transfer: 09/11/18  Discharge Plan and Services   Discharge Planning Services: CM Consult                                 Social Determinants of Health ([place]) Interventions     Readmission Risk Interventions Readmission Risk Prevention Plan 07/31/2018 06/29/2018  Transportation Screening Complete Complete  PCP or Specialist Appt within 3-5 Days Complete Complete  HRI or Home Care Consult Complete Complete  Social Work Consult for [place] Planning/Counseling - Complete  Palliative Care Screening Not Complete Not Applicable  Medication Review Press photographer) Complete Complete  Some recent data might be hidden

## 2018-09-11 NOTE — Progress Notes (Signed)
Inpatient Diabetes Program Recommendations  AACE/ADA: New Consensus Statement on Inpatient Glycemic Control (2015)  Target Ranges:  Prepandial:   less than 140 mg/dL      Peak postprandial:   less than 180 mg/dL (1-2 hours)      Critically ill patients:  140 - 180 mg/dL   Lab Results  Component Value Date   GLUCAP 234 (H) 09/11/2018   HGBA1C 7.2 (H) 07/31/2018    Review of Glycemic Control  Diabetes history: DM 2 Outpatient Diabetes medications: Lantus 20 units daily, Humalog 2-18 units tid  Current orders for Inpatient glycemic control:  Lantus 25 units Novolog 0-20 units tid Novolog 0-5 units qhs  Inpatient Diabetes Program Recommendations:    Glucose trends increased into the 200's.   Please consider increasing Lantus to 30 units.   Also consider Novolog 2-3 units tid meal coverage if patient consumes at least 50% of meals.  Thanks,  Tama Headings RN, MSN, BC-ADM Inpatient Diabetes Coordinator Team Pager [phone] (8a-5p)

## 2018-09-11 NOTE — Progress Notes (Signed)
Standing order for airbone precaution due to ESBL placed on patient  Elodia Florence RN 09/11/2018 1036

## 2018-09-11 NOTE — Care Management Important Message (Signed)
Important Message  Patient Details  Name: Frank Hoover MRN: [id number] Date of Birth: 16-Jul-1950   Medicare Important Message Given:  Yes     Tommy Medal 09/11/2018, 1:00 PM

## 2018-09-11 NOTE — Progress Notes (Signed)
Physical Therapy Treatment Patient Details Name: Frank Hoover MRN: [id number] DOB: 12-02-1950 Today's Date: 09/11/2018    History of Present Illness Frank Hoover  is a 68 y.o. 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  Slight nausea.   Pt denies fever, chills, cp, palp, sob beyond his baseline, emesis, abd pain, diarrhea, brbpr, dysuria, hematuria, focal neurological weakness.    PT Comments    Patient agreeable and motivated for therapy, demonstrates slow labored movement during bed mobility having to use bed rail to roll side to sit and sitting up from side lying position due to weakness, tends to lean, fall over to the left side when sitting unsupported, can maintain sitting balance using BUE, but has difficulty keeping trunk in midline due to leaning backwards when completing BLE ROM/strengthening exercises while seated at bedside.  Patient limited for completing left ankle active dorsiflexion due to tight heelcords, tolerated standing with RW for up to 40-50 seconds before having to sit due to weakness and limited to 1 shuffling side step at bedside with Max assist to help move feet and maintain standing standing balance with left knee blocked.  Patient put back to bed with Mod/max assist to reposition.  Patient will benefit from continued physical therapy in hospital and recommended venue below to increase strength, balance, endurance for safe ADLs and gait.    Follow Up Recommendations  SNF     Equipment Recommendations  None recommended by PT    Recommendations for Other Services       Precautions / Restrictions Precautions Precautions: Fall Restrictions Weight Bearing Restrictions: No    Mobility  Bed Mobility Overal  bed mobility: Needs Assistance Bed Mobility: Rolling;Sidelying to Sit;Sit to Sidelying Rolling: Mod assist Sidelying to sit: Mod assist;Max assist Supine to sit: Mod assist;Max assist     General bed mobility comments: had to use siderail pull self over when rolling, slow labored movement  Transfers Overall transfer level: Needs assistance Equipment used: Rolling walker (2 wheeled) Transfers: Sit to/from Stand Sit to Stand: Max assist         General transfer comment: has difficulty with sit to stands due to BLE weakness and leaning to the left  Ambulation/Gait Ambulation/Gait assistance: Max assist Gait Distance (Feet): 1 Feet Assistive device: Rolling walker (2 wheeled) Gait Pattern/deviations: Decreased step length - right;Decreased step length - left;Decreased stride length Gait velocity: slow   General Gait Details: limited to 1 shuffling side step requiring tactile assistance to move legs due to weakness   Stairs             Wheelchair Mobility    Modified Rankin (Stroke Patients Only)       Balance Overall balance assessment: Needs assistance Sitting-balance support: Feet supported;No upper extremity supported Sitting balance-Leahy Scale: Poor Sitting balance - Comments: fair using BUE to support self, otherwise leans/falls over to the left unsupported Postural control: Left lateral lean Standing balance support: Bilateral upper extremity supported;During functional activity Standing balance-Leahy Scale: Poor Standing balance comment: using RW                            Cognition Arousal/Alertness: Awake/alert Behavior During Therapy: WFL for tasks assessed/performed Overall Cognitive Status: Within  Functional Limits for tasks assessed                                        Exercises General Exercises - Lower Extremity Long Arc Quad: Seated;AROM;Strengthening;10 reps;Both Hip Flexion/Marching:  Seated;AROM;Strengthening;Both;10 reps Toe Raises: Seated;AROM;Strengthening;Both;10 reps Heel Raises: Seated;AROM;Strengthening;Both;10 reps    General Comments        Pertinent Vitals/Pain Pain Assessment: No/denies pain    Home Living                      Prior Function            PT Goals (current goals can now be found in the care plan section) Acute Rehab PT Goals Patient Stated Goal: return home after rehab PT Goal Formulation: With patient Time For Goal Achievement: 09/22/18 Potential to Achieve Goals: Good Progress towards PT goals: Progressing toward goals    Frequency    Min 3X/week      PT Plan Current plan remains appropriate    Co-evaluation              AM-PAC PT "6 Clicks" Mobility   Outcome Measure  Help needed turning from your back to your side while in a flat bed without using bedrails?: A Lot Help needed moving from lying on your back to sitting on the side of a flat bed without using bedrails?: A Lot Help needed moving to and from a bed to a chair (including a wheelchair)?: Total Help needed standing up from a chair using your arms (e.g., wheelchair or bedside chair)?: A Lot Help needed to walk in hospital room?: Total Help needed climbing 3-5 steps with a railing? : Total 6 Click Score: 9    End of Session   Activity Tolerance: Patient tolerated treatment well;Patient limited by fatigue Patient left: in bed;with call bell/phone within reach Nurse Communication: Mobility status PT Visit Diagnosis: Unsteadiness on feet (R26.81);Other abnormalities of gait and mobility (R26.89);Muscle weakness (generalized) (M62.81)     Time: 3500-9381 PT Time Calculation (min) (ACUTE ONLY): 31 min  Charges:  $Therapeutic Exercise: 8-22 mins $Therapeutic Activity: 8-22 mins                     10:45 AM, 09/11/18 Lonell Grandchild, MPT Physical Therapist with [facility] [phone] office [phone] mobile phone

## 2018-09-12 DIAGNOSIS — E785 Hyperlipidemia, unspecified: Secondary | ICD-10-CM | POA: Diagnosis not present

## 2018-09-12 DIAGNOSIS — L8990 Pressure ulcer of unspecified site, unspecified stage: Secondary | ICD-10-CM | POA: Diagnosis not present

## 2018-09-12 DIAGNOSIS — M6281 Muscle weakness (generalized): Secondary | ICD-10-CM | POA: Diagnosis not present

## 2018-09-12 DIAGNOSIS — M1A9XX0 Chronic gout, unspecified, without tophus (tophi): Secondary | ICD-10-CM | POA: Diagnosis not present

## 2018-09-12 DIAGNOSIS — E1340 Other specified diabetes mellitus with diabetic neuropathy, unspecified: Secondary | ICD-10-CM | POA: Diagnosis not present

## 2018-09-12 DIAGNOSIS — N179 Acute kidney failure, unspecified: Secondary | ICD-10-CM | POA: Diagnosis not present

## 2018-09-12 DIAGNOSIS — E119 Type 2 diabetes mellitus without complications: Secondary | ICD-10-CM | POA: Diagnosis not present

## 2018-09-12 DIAGNOSIS — I509 Heart failure, unspecified: Secondary | ICD-10-CM | POA: Diagnosis not present

## 2018-09-12 DIAGNOSIS — R339 Retention of urine, unspecified: Secondary | ICD-10-CM | POA: Diagnosis not present

## 2018-09-12 DIAGNOSIS — Z7401 Bed confinement status: Secondary | ICD-10-CM | POA: Diagnosis not present

## 2018-09-12 DIAGNOSIS — N390 Urinary tract infection, site not specified: Secondary | ICD-10-CM | POA: Diagnosis not present

## 2018-09-12 DIAGNOSIS — R0902 Hypoxemia: Secondary | ICD-10-CM | POA: Diagnosis not present

## 2018-09-12 DIAGNOSIS — K219 Gastro-esophageal reflux disease without esophagitis: Secondary | ICD-10-CM | POA: Diagnosis not present

## 2018-09-12 DIAGNOSIS — G4733 Obstructive sleep apnea (adult) (pediatric): Secondary | ICD-10-CM | POA: Diagnosis not present

## 2018-09-12 DIAGNOSIS — I428 Other cardiomyopathies: Secondary | ICD-10-CM | POA: Diagnosis not present

## 2018-09-12 DIAGNOSIS — I10 Essential (primary) hypertension: Secondary | ICD-10-CM | POA: Diagnosis not present

## 2018-09-12 DIAGNOSIS — I5022 Chronic systolic (congestive) heart failure: Secondary | ICD-10-CM | POA: Diagnosis not present

## 2018-09-12 DIAGNOSIS — J449 Chronic obstructive pulmonary disease, unspecified: Secondary | ICD-10-CM | POA: Diagnosis not present

## 2018-09-12 DIAGNOSIS — R5381 Other malaise: Secondary | ICD-10-CM | POA: Diagnosis not present

## 2018-09-12 DIAGNOSIS — R262 Difficulty in walking, not elsewhere classified: Secondary | ICD-10-CM | POA: Diagnosis not present

## 2018-09-12 DIAGNOSIS — Z1612 Extended spectrum beta lactamase (ESBL) resistance: Secondary | ICD-10-CM | POA: Diagnosis not present

## 2018-09-12 LAB — BASIC METABOLIC PANEL
Anion gap: 9 (ref 5–15)
BUN: 30 mg/dL — ABNORMAL HIGH (ref 8–23)
CO2: 21 mmol/L — ABNORMAL LOW (ref 22–32)
Calcium: 9.4 mg/dL (ref 8.9–10.3)
Chloride: 105 mmol/L (ref 98–111)
Creatinine, Ser: 1.61 mg/dL — ABNORMAL HIGH (ref 0.61–1.24)
GFR calc Af Amer: 51 mL/min — ABNORMAL LOW (ref >60)
GFR calc non Af Amer: 44 mL/min — ABNORMAL LOW (ref >60)
Glucose, Bld: 179 mg/dL — ABNORMAL HIGH (ref 70–99)
Potassium: 3.8 mmol/L (ref 3.5–5.1)
Sodium: 135 mmol/L (ref 135–145)

## 2018-09-12 LAB — GLUCOSE, CAPILLARY
Glucose-Capillary: 157 mg/dL — ABNORMAL HIGH (ref 70–99)
Glucose-Capillary: 218 mg/dL — ABNORMAL HIGH (ref 70–99)
Glucose-Capillary: 207 mg/dL — ABNORMAL HIGH (ref 70–99)

## 2018-09-12 LAB — SARS CORONAVIRUS 2 BY RT PCR (HOSPITAL ORDER, PERFORMED IN ~~LOC~~ HOSPITAL LAB): SARS Coronavirus 2: NEGATIVE

## 2018-09-12 MED ORDER — INSULIN ASPART 100 UNIT/ML ~~LOC~~ SOLN: 0.0000 [IU] | Freq: Three times a day (TID) | SUBCUTANEOUS | 11 refills | Status: DC

## 2018-09-12 MED ORDER — ACETAMINOPHEN 325 MG PO TABS: 650.0000 mg | ORAL_TABLET | Freq: Four times a day (QID) | ORAL | 5 refills | Status: AC | PRN

## 2018-09-12 MED ORDER — PANTOPRAZOLE SODIUM 40 MG PO TBEC: 40.0000 mg | DELAYED_RELEASE_TABLET | Freq: Every day | ORAL | Status: DC

## 2018-09-12 MED ORDER — ALLOPURINOL 100 MG PO TABS: 200.0000 mg | ORAL_TABLET | Freq: Every day | ORAL | Status: AC

## 2018-09-12 MED ORDER — INSULIN ASPART 100 UNIT/ML ~~LOC~~ SOLN: 0.0000 [IU] | Freq: Every day | SUBCUTANEOUS | 11 refills | Status: DC

## 2018-09-12 MED ORDER — ALLOPURINOL 100 MG PO TABS: 200.0000 mg | ORAL_TABLET | Freq: Every day | ORAL | Status: DC

## 2018-09-12 MED ORDER — FOSFOMYCIN TROMETHAMINE 3 G PO PACK
3.0000 g | PACK | Freq: Once | ORAL | Status: AC
  Administered 2018-09-12: 3 g via ORAL
  Filled 2018-09-12: qty 3

## 2018-09-12 MED ORDER — INSULIN GLARGINE 100 UNIT/ML ~~LOC~~ SOLN: 25.0000 [IU] | Freq: Every day | SUBCUTANEOUS | 11 refills | Status: DC

## 2018-09-12 MED ORDER — DOCUSATE SODIUM 100 MG PO CAPS: 200.0000 mg | ORAL_CAPSULE | Freq: Every day | ORAL | 0 refills | Status: DC

## 2018-09-12 NOTE — Progress Notes (Signed)
Report called to [facility]), given to Roselind Messier, Therapist, sports. IV removed. EMS to transport, awaiting arrival. Will continue to monitor.

## 2018-09-12 NOTE — Discharge Summary (Addendum)
Physician Discharge Summary  Patient ID: JOHNATAN BASKETTE MRN: [id number] DOB/AGE: [birth year] 68 y.o. Primary Care Physician:Marjani Kobel, Percell Miller, MD Admit date: 09/07/2018 Discharge date: 09/12/2018    Discharge Diagnoses:   Principal Problem:   ARF (acute renal failure) ([place]) Active Problems:   GERD (gastroesophageal reflux disease)   COPD (chronic obstructive pulmonary disease) ([facility])   DM (diabetes mellitus) ([place])   HTN (hypertension)   Chronic systolic CHF (congestive heart failure) ([facility])   NICM (nonischemic cardiomyopathy) ([place])   Acute lower UTI   Pressure injury of skin   Acute renal failure (ARF) (H[facility]) Deconditioning Urinary retention ESBL urinary tract infection Allergies as of 09/12/2018   No Known Allergies     Medication List    STOP taking these medications   furosemide 40 MG tablet Commonly known as: LASIX   HumaLOG KwikPen 100 UNIT/ML KiwkPen Generic drug: insulin lispro   Lantus SoloStar 100 UNIT/ML Solostar Pen Generic drug: Insulin Glargine Replaced by: insulin glargine 100 UNIT/ML injection   potassium chloride SA 20 MEQ tablet Commonly known as: K-DUR     TAKE these medications   acetaminophen 500 MG tablet Commonly known as: TYLENOL Take 1,000 mg by mouth daily as needed for moderate pain or headache. What changed: Another medication with the same name was added. Make sure you understand how and when to take each.   acetaminophen 325 MG tablet Commonly known as: TYLENOL Take 2 tablets (650 mg total) by mouth every 6 (six) hours as needed for mild pain (or Fever >/= 101). What changed: You were already taking a medication with the same name, and this prescription was added. Make sure you understand how and when to take each.   allopurinol 100 MG tablet Commonly known as: ZYLOPRIM Take 2 tablets (200 mg total) by mouth daily. Start taking on: September 13, 2018 What changed:   medication strength  how much to take   alum & mag hydroxide-simeth  200-200-20 MG/5ML suspension Commonly known as: MAALOX/MYLANTA Take 15 mLs by mouth every 6 (six) hours as needed for indigestion or heartburn.   atorvastatin 20 MG tablet Commonly known as: LIPITOR Take 20 mg by mouth at bedtime.   cholecalciferol 1000 units tablet Commonly known as: VITAMIN D Take 1,000 Units by mouth daily.   docusate sodium 100 MG capsule Commonly known as: COLACE Take 2 capsules (200 mg total) by mouth at bedtime.   fluticasone 50 MCG/ACT nasal spray Commonly known as: FLONASE Place 2 sprays into both nostrils daily.   gabapentin 400 MG capsule Commonly known as: NEURONTIN Take 400 mg by mouth daily.   hydrALAZINE 100 MG tablet Commonly known as: APRESOLINE Take 1 tablet (100 mg total) by mouth 3 (three) times daily.   HYDROcodone-acetaminophen 5-325 MG tablet Commonly known as: NORCO/VICODIN Take 1 tablet by mouth every 6 (six) hours as needed.   insulin aspart 100 UNIT/ML injection Commonly known as: novoLOG Inject 0-20 Units into the skin 3 (three) times daily with meals.   insulin aspart 100 UNIT/ML injection Commonly known as: novoLOG Inject 0-5 Units into the skin at bedtime.   insulin glargine 100 UNIT/ML injection Commonly known as: LANTUS Inject 0.25 mLs (25 Units total) into the skin daily. Replaces: Lantus SoloStar 100 UNIT/ML Solostar Pen   isosorbide mononitrate 30 MG 24 hr tablet Commonly known as: IMDUR TAKE 1 TABLET DAILY   linaclotide 145 MCG Caps capsule Commonly known as: LINZESS Take 1 capsule (145 mcg total) by mouth daily before breakfast.   loratadine 10  MG tablet Commonly known as: CLARITIN Take 10 mg by mouth daily.   metoCLOPramide 5 MG tablet Commonly known as: REGLAN Take 5 mg by mouth 3 (three) times daily before meals.   metoprolol succinate 100 MG 24 hr tablet Commonly known as: TOPROL-XL Take 1 tablet (100 mg total) by mouth 2 (two) times a day. Take with or immediately following a meal.    multivitamin with minerals Tabs tablet Take 1 tablet by mouth daily.   ondansetron 4 MG tablet Commonly known as: ZOFRAN Take 1 tablet (4 mg total) by mouth every 8 (eight) hours as needed for nausea or vomiting.   pantoprazole 40 MG tablet Commonly known as: PROTONIX Take 1 tablet (40 mg total) by mouth daily.   ProAir HFA 108 (90 Base) MCG/ACT inhaler Generic drug: albuterol Inhale 2 puffs into the lungs every 6 (six) hours as needed for wheezing or shortness of breath.   tamsulosin 0.4 MG Caps capsule Commonly known as: FLOMAX Take 1 capsule by mouth daily.   traZODone 50 MG tablet Commonly known as: DESYREL Take 0.5 tablets (25 mg total) by mouth at bedtime as needed for sleep.   vitamin C 500 MG tablet Commonly known as: ASCORBIC ACID Take 500 mg by mouth 2 (two) times daily.       Discharged Condition: Improved    Consults: None  Significant Diagnostic Studies: US Renal  Result Date: 09/08/2018 CLINICAL DATA:  Acute renal failure. EXAM: RENAL / URINARY TRACT ULTRASOUND COMPLETE COMPARISON:  None. FINDINGS: Right Kidney: Renal measurements: 7.9 x 4.6 x 5.3 cm = volume: 99.8 mL . Echogenicity within normal limits. No mass or hydronephrosis visualized. Left Kidney: Renal measurements: 8.7 x 4.7 x 5.6 cm = volume: 120.0 mL. Echogenicity within normal limits. No mass or hydronephrosis visualized. Bladder: Decompressed with Foley catheter. IMPRESSION: No hydronephrosis. Electronically Signed   By: Lovey Newcomer M.D.   On: 09/08/2018 14:14   Dg Chest Port 1 View  Result Date: 09/07/2018 CLINICAL DATA:  Weakness EXAM: PORTABLE CHEST 1 VIEW COMPARISON:  07/27/2018 FINDINGS: Cardiac shadow within normal limits. Defibrillator is again seen and stable. Lungs are hypoinflated but clear. No bony abnormality is noted. IMPRESSION: No active disease. Electronically Signed   By: Inez Catalina M.D.   On: 09/07/2018 23:12    Lab Results: Basic Metabolic Panel: Recent Labs     09/11/18 0443 09/12/18 0556  NA 134* 135  K 3.6 3.8  CL 103 105  CO2 20* 21*  GLUCOSE 268* 179*  BUN 28* 30*  CREATININE 1.49* 1.61*  CALCIUM 9.2 9.4   Liver Function Tests: No results for input(s): AST, ALT, ALKPHOS, BILITOT, PROT, ALBUMIN in the last 72 hours.   CBC: No results for input(s): WBC, NEUTROABS, HGB, HCT, MCV, PLT in the last 72 hours.  Recent Results (from the past 240 hour(s))  Urine culture     Status: Abnormal   Collection Time: 09/07/18 11:46 PM   Specimen: Urine, Random  Result Value Ref Range Status   Specimen Description URINE, RANDOM  Final   Special Requests NONE  Final   Culture (A)  Final    >=100,000 COLONIES/mL ESCHERICHIA COLI Confirmed Extended Spectrum Beta-Lactamase Producer (ESBL).  In bloodstream infections from ESBL organisms, carbapenems are preferred over piperacillin/tazobactam. They are shown to have a lower risk of mortality.    Report Status 09/10/2018 FINAL  Final   Organism ID, Bacteria ESCHERICHIA COLI (A)  Final      Susceptibility   Escherichia coli -  MIC*    AMPICILLIN >=32 RESISTANT Resistant     CEFAZOLIN Value in next row Resistant      RESISTANTCORRECTED ON 09/10/2018 at 0849: PREVIOUSLY REPORTED AS 16 SS    CEFTRIAXONE Value in next row Resistant      RESISTANTCORRECTED ON 09/10/2018 at 0849: PREVIOUSLY REPORTED AS <=1 SS    CIPROFLOXACIN Value in next row Resistant      RESISTANTCORRECTED ON 09/10/2018 at 0849: PREVIOUSLY REPORTED AS <=1 SS    GENTAMICIN Value in next row Sensitive      RESISTANTCORRECTED ON 09/10/2018 at 0849: PREVIOUSLY REPORTED AS <=1 SS    IMIPENEM Value in next row Sensitive      RESISTANTCORRECTED ON 09/10/2018 at 0849: PREVIOUSLY REPORTED AS <=1 SS    NITROFURANTOIN Value in next row Sensitive      RESISTANTCORRECTED ON 09/10/2018 at 0849: PREVIOUSLY REPORTED AS <=1 SS    TRIMETH/SULFA Value in next row Resistant      RESISTANTCORRECTED ON 09/10/2018 at 0849: PREVIOUSLY REPORTED AS <=1 SS     AMPICILLIN/SULBACTAM Value in next row Resistant      RESISTANTCORRECTED ON 09/10/2018 at 0849: PREVIOUSLY REPORTED AS <=1 SS    PIP/TAZO Value in next row Resistant      RESISTANTCORRECTED ON 09/10/2018 at 0849: PREVIOUSLY REPORTED AS <=1 SS    Extended ESBL Value in next row Resistant      [facility]IVEPerformed at [facility], [address], [place], [place] [address]    * >=100,000 COLONIES/mL ESCHERICHIA COLI  SARS Coronavirus 2 (CEPHEID - Performed in [facility] hospital lab), Hosp Order     Status: None   Collection Time: 09/07/18 11:53 PM   Specimen: Nasopharyngeal Swab  Result Value Ref Range Status   SARS Coronavirus 2 NEGATIVE NEGATIVE Final    Comment: (NOTE) If result is NEGATIVE SARS-CoV-2 target nucleic acids are NOT DETECTED. The SARS-CoV-2 RNA is generally detectable in upper and lower  respiratory specimens during the acute phase of infection. The lowest  concentration of SARS-CoV-2 viral copies this assay can detect is 250  copies / mL. A negative result does not preclude SARS-CoV-2 infection  and should not be used as the sole basis for treatment or other  patient management decisions.  A negative result may occur with  improper specimen collection / handling, submission of specimen other  than nasopharyngeal swab, presence of viral mutation(s) within the  areas targeted by this assay, and inadequate number of viral copies  (<250 copies / mL). A negative result must be combined with clinical  observations, patient history, and epidemiological information. If result is POSITIVE SARS-CoV-2 target nucleic acids are DETECTED. The SARS-CoV-2 RNA is generally detectable in upper and lower  respiratory specimens dur ing the acute phase of infection.  Positive  results are indicative of active infection with SARS-CoV-2.  Clinical  correlation with patient history and other diagnostic information is  necessary to determine patient infection status.  Positive results do   not rule out bacterial infection or co-infection with other viruses. If result is PRESUMPTIVE POSTIVE SARS-CoV-2 nucleic acids MAY BE PRESENT.   A presumptive positive result was obtained on the submitted specimen  and confirmed on repeat testing.  While 2019 novel coronavirus  (SARS-CoV-2) nucleic acids may be present in the submitted sample  additional confirmatory testing may be necessary for epidemiological  and / or clinical management purposes  to differentiate between  SARS-CoV-2 and other Sarbecovirus currently known to infect humans.  If clinically  indicated additional testing with an alternate test  methodology [phone]) is advised. The SARS-CoV-2 RNA is generally  detectable in upper and lower respiratory sp ecimens during the acute  phase of infection. The expected result is Negative. Fact Sheet for Patients:  StrictlyIdeas.no Fact Sheet for Healthcare Providers: BankingDealers.co.za This test is not yet approved or cleared by the [place] FDA and has been authorized for detection and/or diagnosis of SARS-CoV-2 by FDA under an Emergency Use Authorization (EUA).  This EUA will remain in effect (meaning this test can be used) for the duration of the COVID-19 declaration under Section 564(b)(1) of the Act, 21 U.S.C. section 360bbb-3(b)(1), unless the authorization is terminated or revoked sooner. Performed at [facility], [address] [place], [place] [address]      Hospital Course: This is a 68 year old who came to the emergency department because of increasing weakness.  He was found in a hot house.  He was found to have acute on chronic renal failure and he was treated with hydration.  His renal function improved.  He has chronic systolic heart failure but that does not seem to be giving him trouble now.  He has urinary retention and he will have a Foley catheter with follow-up with urology.  By the time of discharge his  creatinine was 1.69.  He has not had any diuretics.  He feels better.  He is still very weak.  It was recommended that he go to skilled care facility for rehab.  This is actually been recommended the last 2 times he is been in the hospital and he has refused but he agrees this time that he needs to do that.  Discharge Exam: Blood pressure 119/82, pulse 67, temperature 98.2 F (36.8 C), resp. rate 18, height 5' 6"  (1.676 m), weight 100.4 kg, SpO2 98 %. He is awake and alert.  No acute distress.  Chest is clear.  Disposition: To skilled care facility.  He needs PT OT and speech as needed.  He needs to have daily weights and if his weight goes up as much as 3 pounds facility doctor needs to be notified to resume his Lasix.  Foley catheter will remain in place and he will need urology evaluation with voiding trial.  Please check basic metabolic profile on 0/09/6224.  Encourage p.o. fluids     Contact information for follow-up providers    Sinda Du, MD Follow up.   Specialty: Pulmonary Disease Contact information: [place] [address] [phone]            Contact information for after-discharge care    [facility] .   Service: Skilled Nursing Contact information: [address] [place] [phone]                  Signed: Alonza Bogus   09/12/2018, 10:49 AM

## 2018-09-12 NOTE — Progress Notes (Signed)
Subjective: He says he feels okay.  His creatinine went up slightly but I do not think enough to keep him in the hospital.  He is ready for transfer to skilled care facility.  Objective: Vital signs in last 24 hours: Temp:  [98.2 F (36.8 C)-98.4 F (36.9 C)] 98.2 F (36.8 C) (07/11 0544) Pulse Rate:  [67-113] 67 (07/11 0544) Resp:  [17-18] 18 (07/11 0544) BP: (92-142)/(69-82) 119/82 (07/11 0544) SpO2:  [95 %-98 %] 98 % (07/11 0544) Weight:  [100.4 kg] 100.4 kg (07/11 0500) Weight change: 1.7 kg Last BM Date: 09/11/18  Intake/Output from previous day: 07/10 0701 - 07/11 0700 In: 720 [P.O.:720] Out: 1550 [Urine:1550]  PHYSICAL EXAM General appearance: alert, cooperative and no distress Resp: clear to auscultation bilaterally Cardio: regular rate and rhythm, S1, S2 normal, no murmur, click, rub or gallop GI: soft, non-tender; bowel sounds normal; no masses,  no organomegaly Extremities: extremities normal, atraumatic, no cyanosis or edema  Lab Results:  Results for orders placed or performed during the hospital encounter of 09/07/18 (from the past 48 hour(s))  Glucose, capillary     Status: Abnormal   Collection Time: 09/10/18 11:22 AM  Result Value Ref Range   Glucose-Capillary 246 (H) 70 - 99 mg/dL  Glucose, capillary     Status: Abnormal   Collection Time: 09/10/18  4:33 PM  Result Value Ref Range   Glucose-Capillary 290 (H) 70 - 99 mg/dL  Glucose, capillary     Status: Abnormal   Collection Time: 09/10/18  8:52 PM  Result Value Ref Range   Glucose-Capillary 294 (H) 70 - 99 mg/dL   Comment 1 Notify RN   Basic metabolic panel     Status: Abnormal   Collection Time: 09/11/18  4:43 AM  Result Value Ref Range   Sodium 134 (L) 135 - 145 mmol/L   Potassium 3.6 3.5 - 5.1 mmol/L   Chloride 103 98 - 111 mmol/L   CO2 20 (L) 22 - 32 mmol/L   Glucose, Bld 268 (H) 70 - 99 mg/dL   BUN 28 (H) 8 - 23 mg/dL   Creatinine, Ser 1.49 (H) 0.61 - 1.24 mg/dL   Calcium 9.2 8.9 - 10.3  mg/dL   GFR calc non Af Amer 48 (L) >60 mL/min   GFR calc Af Amer 56 (L) >60 mL/min   Anion gap 11 5 - 15    Comment: Performed at [facility], [address], [place], [place] [address]  Glucose, capillary     Status: Abnormal   Collection Time: 09/11/18  7:23 AM  Result Value Ref Range   Glucose-Capillary 234 (H) 70 - 99 mg/dL  Glucose, capillary     Status: Abnormal   Collection Time: 09/11/18 12:10 PM  Result Value Ref Range   Glucose-Capillary 262 (H) 70 - 99 mg/dL  Glucose, capillary     Status: Abnormal   Collection Time: 09/11/18  4:12 PM  Result Value Ref Range   Glucose-Capillary 249 (H) 70 - 99 mg/dL  Glucose, capillary     Status: Abnormal   Collection Time: 09/11/18  8:34 PM  Result Value Ref Range   Glucose-Capillary 237 (H) 70 - 99 mg/dL   Comment 1 Notify RN   Basic metabolic panel     Status: Abnormal   Collection Time: 09/12/18  5:56 AM  Result Value Ref Range   Sodium 135 135 - 145 mmol/L   Potassium 3.8 3.5 - 5.1 mmol/L   Chloride 105 98 - 111 mmol/L  CO2 21 (L) 22 - 32 mmol/L   Glucose, Bld 179 (H) 70 - 99 mg/dL   BUN 30 (H) 8 - 23 mg/dL   Creatinine, Ser 1.61 (H) 0.61 - 1.24 mg/dL   Calcium 9.4 8.9 - 10.3 mg/dL   GFR calc non Af Amer 44 (L) >60 mL/min   GFR calc Af Amer 51 (L) >60 mL/min   Anion gap 9 5 - 15    Comment: Performed at [facility], [address], [place], [place] [address]  Glucose, capillary     Status: Abnormal   Collection Time: 09/12/18  7:17 AM  Result Value Ref Range   Glucose-Capillary 157 (H) 70 - 99 mg/dL    ABGS No results for input(s): PHART, PO2ART, TCO2, HCO3 in the last 72 hours.  Invalid input(s): PCO2 CULTURES Recent Results (from the past 240 hour(s))  Urine culture     Status: Abnormal   Collection Time: 09/07/18 11:46 PM   Specimen: Urine, Random  Result Value Ref Range Status   Specimen Description URINE, RANDOM  Final   Special Requests NONE  Final   Culture (A)  Final    >=100,000 COLONIES/mL  ESCHERICHIA COLI Confirmed Extended Spectrum Beta-Lactamase Producer (ESBL).  In bloodstream infections from ESBL organisms, carbapenems are preferred over piperacillin/tazobactam. They are shown to have a lower risk of mortality.    Report Status 09/10/2018 FINAL  Final   Organism ID, Bacteria ESCHERICHIA COLI (A)  Final      Susceptibility   Escherichia coli - MIC*    AMPICILLIN >=32 RESISTANT Resistant     CEFAZOLIN Value in next row Resistant      RESISTANTCORRECTED ON 09/10/2018 at 0849: PREVIOUSLY REPORTED AS 16 SS    CEFTRIAXONE Value in next row Resistant      RESISTANTCORRECTED ON 09/10/2018 at 0849: PREVIOUSLY REPORTED AS <=1 SS    CIPROFLOXACIN Value in next row Resistant      RESISTANTCORRECTED ON 09/10/2018 at 0849: PREVIOUSLY REPORTED AS <=1 SS    GENTAMICIN Value in next row Sensitive      RESISTANTCORRECTED ON 09/10/2018 at 0849: PREVIOUSLY REPORTED AS <=1 SS    IMIPENEM Value in next row Sensitive      RESISTANTCORRECTED ON 09/10/2018 at 0849: PREVIOUSLY REPORTED AS <=1 SS    NITROFURANTOIN Value in next row Sensitive      RESISTANTCORRECTED ON 09/10/2018 at 0849: PREVIOUSLY REPORTED AS <=1 SS    TRIMETH/SULFA Value in next row Resistant      RESISTANTCORRECTED ON 09/10/2018 at 0849: PREVIOUSLY REPORTED AS <=1 SS    AMPICILLIN/SULBACTAM Value in next row Resistant      RESISTANTCORRECTED ON 09/10/2018 at 0849: PREVIOUSLY REPORTED AS <=1 SS    PIP/TAZO Value in next row Resistant      RESISTANTCORRECTED ON 09/10/2018 at 0849: PREVIOUSLY REPORTED AS <=1 SS    Extended ESBL Value in next row Resistant      [facility] at [facility], [address], [place], [place] [address]    * >=100,000 COLONIES/mL ESCHERICHIA COLI  SARS Coronavirus 2 (CEPHEID - Performed in [place] hospital lab), Hosp Order     Status: None   Collection Time: 09/07/18 11:53 PM   Specimen: Nasopharyngeal Swab  Result Value Ref Range Status   SARS Coronavirus 2 NEGATIVE NEGATIVE  Final    Comment: (NOTE) If result is NEGATIVE SARS-CoV-2 target nucleic acids are NOT DETECTED. The SARS-CoV-2 RNA is generally detectable in upper and lower  respiratory specimens during  the acute phase of infection. The lowest  concentration of SARS-CoV-2 viral copies this assay can detect is 250  copies / mL. A negative result does not preclude SARS-CoV-2 infection  and should not be used as the sole basis for treatment or other  patient management decisions.  A negative result may occur with  improper specimen collection / handling, submission of specimen other  than nasopharyngeal swab, presence of viral mutation(s) within the  areas targeted by this assay, and inadequate number of viral copies  (<250 copies / mL). A negative result must be combined with clinical  observations, patient history, and epidemiological information. If result is POSITIVE SARS-CoV-2 target nucleic acids are DETECTED. The SARS-CoV-2 RNA is generally detectable in upper and lower  respiratory specimens dur ing the acute phase of infection.  Positive  results are indicative of active infection with SARS-CoV-2.  Clinical  correlation with patient history and other diagnostic information is  necessary to determine patient infection status.  Positive results do  not rule out bacterial infection or co-infection with other viruses. If result is PRESUMPTIVE POSTIVE SARS-CoV-2 nucleic acids MAY BE PRESENT.   A presumptive positive result was obtained on the submitted specimen  and confirmed on repeat testing.  While 2019 novel coronavirus  (SARS-CoV-2) nucleic acids may be present in the submitted sample  additional confirmatory testing may be necessary for epidemiological  and / or clinical management purposes  to differentiate between  SARS-CoV-2 and other Sarbecovirus currently known to infect humans.  If clinically indicated additional testing with an alternate test  methodology [phone]) is advised. The  SARS-CoV-2 RNA is generally  detectable in upper and lower respiratory sp ecimens during the acute  phase of infection. The expected result is Negative. Fact Sheet for Patients:  StrictlyIdeas.no Fact Sheet for Healthcare Providers: BankingDealers.co.za This test is not yet approved or cleared by the [place] FDA and has been authorized for detection and/or diagnosis of SARS-CoV-2 by FDA under an Emergency Use Authorization (EUA).  This EUA will remain in effect (meaning this test can be used) for the duration of the COVID-19 declaration under Section 564(b)(1) of the Act, 21 U.S.C. section 360bbb-3(b)(1), unless the authorization is terminated or revoked sooner. Performed at [facility], [address], [place], [place] [address]    Studies/Results: No results found.  Medications:  Prior to Admission:  Medications Prior to Admission  Medication Sig Dispense Refill Last Dose  . acetaminophen (TYLENOL) 500 MG tablet Take 1,000 mg by mouth daily as needed for moderate pain or headache.      . albuterol (PROAIR HFA) 108 (90 BASE) MCG/ACT inhaler Inhale 2 puffs into the lungs every 6 (six) hours as needed for wheezing or shortness of breath.      . allopurinol (ZYLOPRIM) 300 MG tablet Take 300 mg by mouth daily.     09/07/2018 at 0900  . alum & mag hydroxide-simeth (MAALOX/MYLANTA) 200-200-20 MG/5ML suspension Take 15 mLs by mouth every 6 (six) hours as needed for indigestion or heartburn.     Marland Kitchen atorvastatin (LIPITOR) 20 MG tablet Take 20 mg by mouth at bedtime.   09/07/2018 at 2100  . cholecalciferol (VITAMIN D) 1000 units tablet Take 1,000 Units by mouth daily.   09/07/2018 at 0900  . fluticasone (FLONASE) 50 MCG/ACT nasal spray Place 2 sprays into both nostrils daily.      . furosemide (LASIX) 40 MG tablet Take 20-40 mg by mouth every morning. Patient takes 19m in the morning and 262m  in the evening   09/07/2018 at 2100  . gabapentin  (NEURONTIN) 400 MG capsule Take 400 mg by mouth daily.    09/07/2018 at 0900  . hydrALAZINE (APRESOLINE) 100 MG tablet Take 1 tablet (100 mg total) by mouth 3 (three) times daily. 90 tablet 3 09/07/2018 at 2100  . HYDROcodone-acetaminophen (NORCO/VICODIN) 5-325 MG tablet Take 1 tablet by mouth every 6 (six) hours as needed.   09/07/2018 at Unknown time  . insulin lispro (HUMALOG KWIKPEN) 100 UNIT/ML KiwkPen Inject 18-20 Units into the skin as directed. Inject 2 to 18 units based on sliding scale as directed as needed for blood glucose over 200   Past Week at Unknown time  . isosorbide mononitrate (IMDUR) 30 MG 24 hr tablet TAKE 1 TABLET DAILY 90 tablet 0 09/07/2018 at 0900  . LANTUS SOLOSTAR 100 UNIT/ML Solostar Pen Inject 20 Units into the skin daily.    09/07/2018 at 0900  . linaclotide (LINZESS) 145 MCG CAPS capsule Take 1 capsule (145 mcg total) by mouth daily before breakfast. 30 capsule 5 09/07/2018 at 0900  . loratadine (CLARITIN) 10 MG tablet Take 10 mg by mouth daily.   09/07/2018 at 0900  . metoCLOPramide (REGLAN) 5 MG tablet Take 5 mg by mouth 3 (three) times daily before meals.   09/07/2018 at 2100  . metoprolol succinate (TOPROL-XL) 100 MG 24 hr tablet Take 1 tablet (100 mg total) by mouth 2 (two) times a day. Take with or immediately following a meal. 180 tablet 1 09/07/2018 at 2100  . Multiple Vitamin (MULTIVITAMIN WITH MINERALS) TABS tablet Take 1 tablet by mouth daily.   09/07/2018 at 0900  . ondansetron (ZOFRAN) 4 MG tablet Take 1 tablet (4 mg total) by mouth every 8 (eight) hours as needed for nausea or vomiting. 30 tablet 1   . potassium chloride SA (K-DUR) 20 MEQ tablet Take 1 tablet (20 mEq total) by mouth 2 (two) times daily. (Patient taking differently: Take 20 mEq by mouth daily. ) 60 tablet 5 09/07/2018 at 0900  . tamsulosin (FLOMAX) 0.4 MG CAPS capsule Take 1 capsule by mouth daily.   09/07/2018 at 0900  . traZODone (DESYREL) 50 MG tablet Take 0.5 tablets (25 mg total) by mouth at bedtime as needed  for sleep. 30 tablet 5   . vitamin C (ASCORBIC ACID) 500 MG tablet Take 500 mg by mouth 2 (two) times daily.   09/07/2018 at 2100   Scheduled: . allopurinol  300 mg Oral Daily  . atorvastatin  20 mg Oral QHS  . cholecalciferol  1,000 Units Oral Daily  . docusate sodium  200 mg Oral QHS  . fluticasone  2 spray Each Nare Daily  . gabapentin  400 mg Oral Daily  . heparin  5,000 Units Subcutaneous Q8H  . hydrALAZINE  100 mg Oral TID  . insulin aspart  0-20 Units Subcutaneous TID WC  . insulin aspart  0-5 Units Subcutaneous QHS  . insulin glargine  25 Units Subcutaneous Daily  . isosorbide mononitrate  30 mg Oral Daily  . linaclotide  145 mcg Oral QAC breakfast  . loratadine  10 mg Oral Daily  . metoCLOPramide  5 mg Oral TID AC  . metoprolol succinate  100 mg Oral BID  . multivitamin with minerals  1 tablet Oral Daily  . pantoprazole  40 mg Oral Daily  . potassium chloride SA  20 mEq Oral BID  . tamsulosin  0.4 mg Oral Daily  . vitamin C  500 mg Oral  BID   Continuous:  [id number] **OR** acetaminophen, albuterol, HYDROcodone-acetaminophen, ondansetron, traZODone  Assesment: He was admitted with acute on chronic renal failure.  His renal function has improved.  I think he was dehydrated on admission.  He has no complaints now.  He has urinary retention and has a Foley catheter which will need to go with him to the skilled care facility.  He will need follow-up with urology  He has diabetes which is fairly well controlled  He has nonischemic cardiomyopathy which is stable.  He has chronic systolic heart failure from that  He is severely deconditioned and needs rehab. Principal Problem:   ARF (acute renal failure) (HCC) Active Problems:   GERD (gastroesophageal reflux disease)   COPD (chronic obstructive pulmonary disease) (HCC)   DM (diabetes mellitus) (HCC)   HTN (hypertension)   Chronic systolic CHF (congestive heart failure) (HCC)   NICM (nonischemic cardiomyopathy)  ([place])   Acute lower UTI   Pressure injury of skin   Acute renal failure (ARF) ([place])    Plan: Discharge to skilled care facility today    LOS: 4 days   Frank Hoover 09/12/2018, 10:40 AM

## 2018-09-12 NOTE — TOC Transition Note (Signed)
Transition of Care [facility]) - CM/SW Discharge Note   Patient Details  Name: Frank Hoover MRN: [id number] Date of Birth: 1950-11-22  [facility] CM/SW Contact:  Latanya Maudlin, RN Phone Number: 09/12/2018, 2:23 PM   Clinical Narrative:  Patient to [facility] Patient will go to [address] hall. New COVID test is negative. Bedside RN to call report. [facility] for transport.     Final next level of care: Skilled Nursing Facility Barriers to Discharge: No Barriers Identified   Patient Goals and CMS Choice Patient states their goals for this hospitalization and ongoing recovery are:: "I'm worried about my son." CMS Medicare.gov Compare Post Acute Care list provided to:: Patient Choice offered to / list presented to : Patient  Discharge Placement   Existing PASRR number confirmed : 09/09/18          Patient chooses bed at: [facility] Patient to be transferred to facility by: [place] Name of family member notified: Ms Cerami Patient and family notified of of transfer: 09/11/18  Discharge Plan and Services   Discharge Planning Services: CM Consult                                 Social Determinants of Health ([place]) Interventions     Readmission Risk Interventions Readmission Risk Prevention Plan 07/31/2018 06/29/2018  Transportation Screening Complete Complete  PCP or Specialist Appt within 3-5 Days Complete Complete  HRI or Home Care Consult Complete Complete  Social Work Consult for [place] Planning/Counseling - Complete  Palliative Care Screening Not Complete Not Applicable  Medication Review Press photographer) Complete Complete  Some recent data might be hidden

## 2018-09-14 ENCOUNTER — Ambulatory Visit: Payer: Medicare Other | Admitting: Gastroenterology

## 2018-09-14 NOTE — Progress Notes (Signed)
No ICM remote transmission received for 09/14/2018 due to patient is in SNF following hospitalization and next ICM transmission scheduled for 10/05/2018.

## 2018-09-16 DIAGNOSIS — N390 Urinary tract infection, site not specified: Secondary | ICD-10-CM | POA: Diagnosis not present

## 2018-09-16 DIAGNOSIS — E119 Type 2 diabetes mellitus without complications: Secondary | ICD-10-CM | POA: Diagnosis not present

## 2018-09-16 DIAGNOSIS — J449 Chronic obstructive pulmonary disease, unspecified: Secondary | ICD-10-CM | POA: Diagnosis not present

## 2018-09-16 DIAGNOSIS — K219 Gastro-esophageal reflux disease without esophagitis: Secondary | ICD-10-CM | POA: Diagnosis not present

## 2018-09-17 ENCOUNTER — Ambulatory Visit: Payer: Medicare Other | Admitting: Gastroenterology

## 2018-09-17 ENCOUNTER — Encounter: Payer: Self-pay | Admitting: Internal Medicine

## 2018-09-17 ENCOUNTER — Telehealth: Payer: Self-pay | Admitting: Internal Medicine

## 2018-09-17 NOTE — Telephone Encounter (Signed)
Patient was a no show and letter sent  °

## 2018-09-22 DIAGNOSIS — I10 Essential (primary) hypertension: Secondary | ICD-10-CM | POA: Diagnosis not present

## 2018-09-22 DIAGNOSIS — E119 Type 2 diabetes mellitus without complications: Secondary | ICD-10-CM | POA: Diagnosis not present

## 2018-09-22 DIAGNOSIS — J449 Chronic obstructive pulmonary disease, unspecified: Secondary | ICD-10-CM | POA: Diagnosis not present

## 2018-09-22 DIAGNOSIS — K219 Gastro-esophageal reflux disease without esophagitis: Secondary | ICD-10-CM | POA: Diagnosis not present

## 2018-10-03 DIAGNOSIS — G4733 Obstructive sleep apnea (adult) (pediatric): Secondary | ICD-10-CM | POA: Diagnosis not present

## 2018-10-03 DIAGNOSIS — I509 Heart failure, unspecified: Secondary | ICD-10-CM | POA: Diagnosis not present

## 2018-10-06 ENCOUNTER — Telehealth: Payer: Self-pay

## 2018-10-06 NOTE — Telephone Encounter (Signed)
Unable to speak  with patient to remind of missed remote transmission 

## 2018-10-14 ENCOUNTER — Ambulatory Visit: Payer: Medicare Other | Admitting: Cardiology

## 2018-10-14 NOTE — Progress Notes (Signed)
No ICM remote transmission received for 10/05/2018 and next ICM transmission scheduled for 11/04/2018.

## 2018-10-16 ENCOUNTER — Encounter: Payer: Self-pay | Admitting: Cardiology

## 2018-10-20 DIAGNOSIS — M6281 Muscle weakness (generalized): Secondary | ICD-10-CM | POA: Diagnosis not present

## 2018-10-20 DIAGNOSIS — R262 Difficulty in walking, not elsewhere classified: Secondary | ICD-10-CM | POA: Diagnosis not present

## 2018-10-20 DIAGNOSIS — U071 COVID-19: Secondary | ICD-10-CM | POA: Diagnosis not present

## 2018-10-21 DIAGNOSIS — R262 Difficulty in walking, not elsewhere classified: Secondary | ICD-10-CM | POA: Diagnosis not present

## 2018-10-21 DIAGNOSIS — M6281 Muscle weakness (generalized): Secondary | ICD-10-CM | POA: Diagnosis not present

## 2018-10-21 DIAGNOSIS — U071 COVID-19: Secondary | ICD-10-CM | POA: Diagnosis not present

## 2018-10-22 DIAGNOSIS — R262 Difficulty in walking, not elsewhere classified: Secondary | ICD-10-CM | POA: Diagnosis not present

## 2018-10-22 DIAGNOSIS — M6281 Muscle weakness (generalized): Secondary | ICD-10-CM | POA: Diagnosis not present

## 2018-10-22 DIAGNOSIS — U071 COVID-19: Secondary | ICD-10-CM | POA: Diagnosis not present

## 2018-10-23 DIAGNOSIS — M6281 Muscle weakness (generalized): Secondary | ICD-10-CM | POA: Diagnosis not present

## 2018-10-23 DIAGNOSIS — R262 Difficulty in walking, not elsewhere classified: Secondary | ICD-10-CM | POA: Diagnosis not present

## 2018-10-23 DIAGNOSIS — U071 COVID-19: Secondary | ICD-10-CM | POA: Diagnosis not present

## 2018-10-25 DIAGNOSIS — G4733 Obstructive sleep apnea (adult) (pediatric): Secondary | ICD-10-CM | POA: Diagnosis not present

## 2018-10-25 DIAGNOSIS — R0902 Hypoxemia: Secondary | ICD-10-CM | POA: Diagnosis not present

## 2018-10-26 ENCOUNTER — Other Ambulatory Visit: Payer: Self-pay | Admitting: Internal Medicine

## 2018-10-26 DIAGNOSIS — I5022 Chronic systolic (congestive) heart failure: Secondary | ICD-10-CM | POA: Diagnosis not present

## 2018-10-26 DIAGNOSIS — E119 Type 2 diabetes mellitus without complications: Secondary | ICD-10-CM | POA: Diagnosis not present

## 2018-10-27 DIAGNOSIS — M6281 Muscle weakness (generalized): Secondary | ICD-10-CM | POA: Diagnosis not present

## 2018-10-27 DIAGNOSIS — U071 COVID-19: Secondary | ICD-10-CM | POA: Diagnosis not present

## 2018-10-27 DIAGNOSIS — R262 Difficulty in walking, not elsewhere classified: Secondary | ICD-10-CM | POA: Diagnosis not present

## 2018-10-28 DIAGNOSIS — U071 COVID-19: Secondary | ICD-10-CM | POA: Diagnosis not present

## 2018-10-28 DIAGNOSIS — R262 Difficulty in walking, not elsewhere classified: Secondary | ICD-10-CM | POA: Diagnosis not present

## 2018-10-28 DIAGNOSIS — M6281 Muscle weakness (generalized): Secondary | ICD-10-CM | POA: Diagnosis not present

## 2018-10-29 DIAGNOSIS — U071 COVID-19: Secondary | ICD-10-CM | POA: Diagnosis not present

## 2018-10-29 DIAGNOSIS — R262 Difficulty in walking, not elsewhere classified: Secondary | ICD-10-CM | POA: Diagnosis not present

## 2018-10-29 DIAGNOSIS — M6281 Muscle weakness (generalized): Secondary | ICD-10-CM | POA: Diagnosis not present

## 2018-10-30 DIAGNOSIS — M6281 Muscle weakness (generalized): Secondary | ICD-10-CM | POA: Diagnosis not present

## 2018-10-30 DIAGNOSIS — U071 COVID-19: Secondary | ICD-10-CM | POA: Diagnosis not present

## 2018-10-30 DIAGNOSIS — R262 Difficulty in walking, not elsewhere classified: Secondary | ICD-10-CM | POA: Diagnosis not present

## 2018-11-03 DIAGNOSIS — G4733 Obstructive sleep apnea (adult) (pediatric): Secondary | ICD-10-CM | POA: Diagnosis not present

## 2018-11-03 DIAGNOSIS — I509 Heart failure, unspecified: Secondary | ICD-10-CM | POA: Diagnosis not present

## 2018-11-04 ENCOUNTER — Ambulatory Visit: Payer: Medicare Other | Admitting: Urology

## 2018-11-04 DIAGNOSIS — R1312 Dysphagia, oropharyngeal phase: Secondary | ICD-10-CM | POA: Diagnosis not present

## 2018-11-04 DIAGNOSIS — M1A9XX0 Chronic gout, unspecified, without tophus (tophi): Secondary | ICD-10-CM | POA: Diagnosis not present

## 2018-11-04 DIAGNOSIS — M6281 Muscle weakness (generalized): Secondary | ICD-10-CM | POA: Diagnosis not present

## 2018-11-04 DIAGNOSIS — R262 Difficulty in walking, not elsewhere classified: Secondary | ICD-10-CM | POA: Diagnosis not present

## 2018-11-05 ENCOUNTER — Encounter: Payer: Medicare Other | Admitting: *Deleted

## 2018-11-05 DIAGNOSIS — R262 Difficulty in walking, not elsewhere classified: Secondary | ICD-10-CM | POA: Diagnosis not present

## 2018-11-05 DIAGNOSIS — M1A9XX0 Chronic gout, unspecified, without tophus (tophi): Secondary | ICD-10-CM | POA: Diagnosis not present

## 2018-11-05 DIAGNOSIS — M6281 Muscle weakness (generalized): Secondary | ICD-10-CM | POA: Diagnosis not present

## 2018-11-05 DIAGNOSIS — R1312 Dysphagia, oropharyngeal phase: Secondary | ICD-10-CM | POA: Diagnosis not present

## 2018-11-06 DIAGNOSIS — R262 Difficulty in walking, not elsewhere classified: Secondary | ICD-10-CM | POA: Diagnosis not present

## 2018-11-06 DIAGNOSIS — M1A9XX0 Chronic gout, unspecified, without tophus (tophi): Secondary | ICD-10-CM | POA: Diagnosis not present

## 2018-11-06 DIAGNOSIS — R1312 Dysphagia, oropharyngeal phase: Secondary | ICD-10-CM | POA: Diagnosis not present

## 2018-11-06 DIAGNOSIS — M6281 Muscle weakness (generalized): Secondary | ICD-10-CM | POA: Diagnosis not present

## 2018-11-09 DIAGNOSIS — M1A9XX0 Chronic gout, unspecified, without tophus (tophi): Secondary | ICD-10-CM | POA: Diagnosis not present

## 2018-11-09 DIAGNOSIS — R1312 Dysphagia, oropharyngeal phase: Secondary | ICD-10-CM | POA: Diagnosis not present

## 2018-11-09 DIAGNOSIS — M6281 Muscle weakness (generalized): Secondary | ICD-10-CM | POA: Diagnosis not present

## 2018-11-09 DIAGNOSIS — R262 Difficulty in walking, not elsewhere classified: Secondary | ICD-10-CM | POA: Diagnosis not present

## 2018-11-10 DIAGNOSIS — M1A9XX0 Chronic gout, unspecified, without tophus (tophi): Secondary | ICD-10-CM | POA: Diagnosis not present

## 2018-11-10 DIAGNOSIS — M79602 Pain in left arm: Secondary | ICD-10-CM | POA: Diagnosis not present

## 2018-11-10 DIAGNOSIS — Z794 Long term (current) use of insulin: Secondary | ICD-10-CM | POA: Diagnosis not present

## 2018-11-10 DIAGNOSIS — J1289 Other viral pneumonia: Secondary | ICD-10-CM | POA: Diagnosis not present

## 2018-11-10 DIAGNOSIS — M6281 Muscle weakness (generalized): Secondary | ICD-10-CM | POA: Diagnosis not present

## 2018-11-10 DIAGNOSIS — L89159 Pressure ulcer of sacral region, unspecified stage: Secondary | ICD-10-CM | POA: Diagnosis not present

## 2018-11-10 DIAGNOSIS — R1312 Dysphagia, oropharyngeal phase: Secondary | ICD-10-CM | POA: Diagnosis not present

## 2018-11-10 DIAGNOSIS — R7881 Bacteremia: Secondary | ICD-10-CM | POA: Diagnosis not present

## 2018-11-10 DIAGNOSIS — R262 Difficulty in walking, not elsewhere classified: Secondary | ICD-10-CM | POA: Diagnosis not present

## 2018-11-10 DIAGNOSIS — E876 Hypokalemia: Secondary | ICD-10-CM | POA: Diagnosis not present

## 2018-11-10 DIAGNOSIS — R531 Weakness: Secondary | ICD-10-CM | POA: Diagnosis not present

## 2018-11-10 DIAGNOSIS — B961 Klebsiella pneumoniae [K. pneumoniae] as the cause of diseases classified elsewhere: Secondary | ICD-10-CM | POA: Diagnosis not present

## 2018-11-10 DIAGNOSIS — M109 Gout, unspecified: Secondary | ICD-10-CM | POA: Diagnosis not present

## 2018-11-10 DIAGNOSIS — N390 Urinary tract infection, site not specified: Secondary | ICD-10-CM | POA: Diagnosis not present

## 2018-11-10 DIAGNOSIS — R410 Disorientation, unspecified: Secondary | ICD-10-CM | POA: Diagnosis not present

## 2018-11-10 DIAGNOSIS — Z7401 Bed confinement status: Secondary | ICD-10-CM | POA: Diagnosis not present

## 2018-11-10 DIAGNOSIS — M7989 Other specified soft tissue disorders: Secondary | ICD-10-CM | POA: Diagnosis not present

## 2018-11-10 DIAGNOSIS — Z23 Encounter for immunization: Secondary | ICD-10-CM | POA: Diagnosis not present

## 2018-11-10 DIAGNOSIS — B9689 Other specified bacterial agents as the cause of diseases classified elsewhere: Secondary | ICD-10-CM | POA: Diagnosis not present

## 2018-11-10 DIAGNOSIS — U071 COVID-19: Secondary | ICD-10-CM | POA: Diagnosis not present

## 2018-11-10 DIAGNOSIS — I10 Essential (primary) hypertension: Secondary | ICD-10-CM | POA: Diagnosis not present

## 2018-11-10 DIAGNOSIS — K219 Gastro-esophageal reflux disease without esophagitis: Secondary | ICD-10-CM | POA: Diagnosis not present

## 2018-11-10 DIAGNOSIS — J189 Pneumonia, unspecified organism: Secondary | ICD-10-CM | POA: Diagnosis not present

## 2018-11-10 DIAGNOSIS — R918 Other nonspecific abnormal finding of lung field: Secondary | ICD-10-CM | POA: Diagnosis not present

## 2018-11-10 DIAGNOSIS — E119 Type 2 diabetes mellitus without complications: Secondary | ICD-10-CM | POA: Diagnosis not present

## 2018-11-10 DIAGNOSIS — T83511A Infection and inflammatory reaction due to indwelling urethral catheter, initial encounter: Secondary | ICD-10-CM | POA: Diagnosis not present

## 2018-11-10 DIAGNOSIS — R5383 Other fatigue: Secondary | ICD-10-CM | POA: Diagnosis not present

## 2018-11-10 DIAGNOSIS — E785 Hyperlipidemia, unspecified: Secondary | ICD-10-CM | POA: Diagnosis not present

## 2018-11-13 NOTE — Progress Notes (Signed)
No ICM remote transmission received for 11/04/2018.  Unable to reach patient since June for ICM follow up.  Will disenroll from [facility] clinic at this time.  Device clinic will continue to follow up every 91 days per protocol.

## 2018-11-17 DIAGNOSIS — R262 Difficulty in walking, not elsewhere classified: Secondary | ICD-10-CM | POA: Diagnosis not present

## 2018-11-17 DIAGNOSIS — R1312 Dysphagia, oropharyngeal phase: Secondary | ICD-10-CM | POA: Diagnosis not present

## 2018-11-17 DIAGNOSIS — M6281 Muscle weakness (generalized): Secondary | ICD-10-CM | POA: Diagnosis not present

## 2018-11-17 DIAGNOSIS — M1A9XX0 Chronic gout, unspecified, without tophus (tophi): Secondary | ICD-10-CM | POA: Diagnosis not present

## 2018-11-18 DIAGNOSIS — R1312 Dysphagia, oropharyngeal phase: Secondary | ICD-10-CM | POA: Diagnosis not present

## 2018-11-18 DIAGNOSIS — M1A9XX0 Chronic gout, unspecified, without tophus (tophi): Secondary | ICD-10-CM | POA: Diagnosis not present

## 2018-11-18 DIAGNOSIS — R262 Difficulty in walking, not elsewhere classified: Secondary | ICD-10-CM | POA: Diagnosis not present

## 2018-11-18 DIAGNOSIS — M6281 Muscle weakness (generalized): Secondary | ICD-10-CM | POA: Diagnosis not present

## 2018-11-19 ENCOUNTER — Encounter: Payer: Self-pay | Admitting: Cardiology

## 2018-11-19 DIAGNOSIS — M1A9XX0 Chronic gout, unspecified, without tophus (tophi): Secondary | ICD-10-CM | POA: Diagnosis not present

## 2018-11-19 DIAGNOSIS — R1312 Dysphagia, oropharyngeal phase: Secondary | ICD-10-CM | POA: Diagnosis not present

## 2018-11-19 DIAGNOSIS — R262 Difficulty in walking, not elsewhere classified: Secondary | ICD-10-CM | POA: Diagnosis not present

## 2018-11-19 DIAGNOSIS — R339 Retention of urine, unspecified: Secondary | ICD-10-CM | POA: Diagnosis not present

## 2018-11-19 DIAGNOSIS — M6281 Muscle weakness (generalized): Secondary | ICD-10-CM | POA: Diagnosis not present

## 2018-11-20 DIAGNOSIS — M1A9XX0 Chronic gout, unspecified, without tophus (tophi): Secondary | ICD-10-CM | POA: Diagnosis not present

## 2018-11-20 DIAGNOSIS — R1312 Dysphagia, oropharyngeal phase: Secondary | ICD-10-CM | POA: Diagnosis not present

## 2018-11-20 DIAGNOSIS — M6281 Muscle weakness (generalized): Secondary | ICD-10-CM | POA: Diagnosis not present

## 2018-11-20 DIAGNOSIS — R262 Difficulty in walking, not elsewhere classified: Secondary | ICD-10-CM | POA: Diagnosis not present

## 2018-11-23 DIAGNOSIS — M1A9XX0 Chronic gout, unspecified, without tophus (tophi): Secondary | ICD-10-CM | POA: Diagnosis not present

## 2018-11-23 DIAGNOSIS — M6281 Muscle weakness (generalized): Secondary | ICD-10-CM | POA: Diagnosis not present

## 2018-11-23 DIAGNOSIS — R1312 Dysphagia, oropharyngeal phase: Secondary | ICD-10-CM | POA: Diagnosis not present

## 2018-11-23 DIAGNOSIS — R262 Difficulty in walking, not elsewhere classified: Secondary | ICD-10-CM | POA: Diagnosis not present

## 2018-11-24 DIAGNOSIS — M1A9XX0 Chronic gout, unspecified, without tophus (tophi): Secondary | ICD-10-CM | POA: Diagnosis not present

## 2018-11-24 DIAGNOSIS — R1312 Dysphagia, oropharyngeal phase: Secondary | ICD-10-CM | POA: Diagnosis not present

## 2018-11-24 DIAGNOSIS — R262 Difficulty in walking, not elsewhere classified: Secondary | ICD-10-CM | POA: Diagnosis not present

## 2018-11-24 DIAGNOSIS — M6281 Muscle weakness (generalized): Secondary | ICD-10-CM | POA: Diagnosis not present

## 2018-11-25 DIAGNOSIS — M1A9XX0 Chronic gout, unspecified, without tophus (tophi): Secondary | ICD-10-CM | POA: Diagnosis not present

## 2018-11-25 DIAGNOSIS — M6281 Muscle weakness (generalized): Secondary | ICD-10-CM | POA: Diagnosis not present

## 2018-11-25 DIAGNOSIS — R0902 Hypoxemia: Secondary | ICD-10-CM | POA: Diagnosis not present

## 2018-11-25 DIAGNOSIS — I5022 Chronic systolic (congestive) heart failure: Secondary | ICD-10-CM | POA: Diagnosis not present

## 2018-11-25 DIAGNOSIS — E119 Type 2 diabetes mellitus without complications: Secondary | ICD-10-CM | POA: Diagnosis not present

## 2018-11-25 DIAGNOSIS — G4733 Obstructive sleep apnea (adult) (pediatric): Secondary | ICD-10-CM | POA: Diagnosis not present

## 2018-11-25 DIAGNOSIS — R1312 Dysphagia, oropharyngeal phase: Secondary | ICD-10-CM | POA: Diagnosis not present

## 2018-11-25 DIAGNOSIS — R262 Difficulty in walking, not elsewhere classified: Secondary | ICD-10-CM | POA: Diagnosis not present

## 2018-11-26 ENCOUNTER — Ambulatory Visit (INDEPENDENT_AMBULATORY_CARE_PROVIDER_SITE_OTHER): Payer: Medicare Other | Admitting: *Deleted

## 2018-11-26 DIAGNOSIS — I255 Ischemic cardiomyopathy: Secondary | ICD-10-CM

## 2018-11-26 DIAGNOSIS — R1312 Dysphagia, oropharyngeal phase: Secondary | ICD-10-CM | POA: Diagnosis not present

## 2018-11-26 DIAGNOSIS — M1A9XX0 Chronic gout, unspecified, without tophus (tophi): Secondary | ICD-10-CM | POA: Diagnosis not present

## 2018-11-26 DIAGNOSIS — R2232 Localized swelling, mass and lump, left upper limb: Secondary | ICD-10-CM | POA: Diagnosis not present

## 2018-11-26 DIAGNOSIS — R262 Difficulty in walking, not elsewhere classified: Secondary | ICD-10-CM | POA: Diagnosis not present

## 2018-11-26 DIAGNOSIS — M6281 Muscle weakness (generalized): Secondary | ICD-10-CM | POA: Diagnosis not present

## 2018-11-26 LAB — CUP PACEART REMOTE DEVICE CHECK
Battery Remaining Longevity: 150 mo
Battery Remaining Percentage: 100 %
Brady Statistic RA Percent Paced: 1 %
Brady Statistic RV Percent Paced: 1 %
Date Time Interrogation Session: 20200924070200
HighPow Impedance: 56 Ohm
Implantable Lead Implant Date: 20180410
Implantable Lead Implant Date: 20180410
Implantable Lead Location: 753859
Implantable Lead Location: 753860
Implantable Lead Model: 292
Implantable Lead Model: 7740
Implantable Lead Serial Number: 420134
Implantable Lead Serial Number: 693412
Implantable Pulse Generator Implant Date: 20180410
Lead Channel Impedance Value: 361 Ohm
Lead Channel Impedance Value: 586 Ohm
Lead Channel Setting Pacing Amplitude: 2 V
Lead Channel Setting Pacing Amplitude: 2.4 V
Lead Channel Setting Pacing Pulse Width: 0.4 ms
Lead Channel Setting Sensing Sensitivity: 0.5 mV
Pulse Gen Serial Number: 533916

## 2018-11-27 DIAGNOSIS — M6281 Muscle weakness (generalized): Secondary | ICD-10-CM | POA: Diagnosis not present

## 2018-11-27 DIAGNOSIS — M1A9XX0 Chronic gout, unspecified, without tophus (tophi): Secondary | ICD-10-CM | POA: Diagnosis not present

## 2018-11-27 DIAGNOSIS — R262 Difficulty in walking, not elsewhere classified: Secondary | ICD-10-CM | POA: Diagnosis not present

## 2018-11-27 DIAGNOSIS — R1312 Dysphagia, oropharyngeal phase: Secondary | ICD-10-CM | POA: Diagnosis not present

## 2018-11-30 DIAGNOSIS — M6281 Muscle weakness (generalized): Secondary | ICD-10-CM | POA: Diagnosis not present

## 2018-11-30 DIAGNOSIS — M1A9XX0 Chronic gout, unspecified, without tophus (tophi): Secondary | ICD-10-CM | POA: Diagnosis not present

## 2018-11-30 DIAGNOSIS — R262 Difficulty in walking, not elsewhere classified: Secondary | ICD-10-CM | POA: Diagnosis not present

## 2018-11-30 DIAGNOSIS — R1312 Dysphagia, oropharyngeal phase: Secondary | ICD-10-CM | POA: Diagnosis not present

## 2018-12-01 DIAGNOSIS — M6281 Muscle weakness (generalized): Secondary | ICD-10-CM | POA: Diagnosis not present

## 2018-12-01 DIAGNOSIS — R262 Difficulty in walking, not elsewhere classified: Secondary | ICD-10-CM | POA: Diagnosis not present

## 2018-12-01 DIAGNOSIS — R1312 Dysphagia, oropharyngeal phase: Secondary | ICD-10-CM | POA: Diagnosis not present

## 2018-12-01 DIAGNOSIS — M1A9XX0 Chronic gout, unspecified, without tophus (tophi): Secondary | ICD-10-CM | POA: Diagnosis not present

## 2018-12-02 DIAGNOSIS — G4730 Sleep apnea, unspecified: Secondary | ICD-10-CM | POA: Diagnosis not present

## 2018-12-02 DIAGNOSIS — M6281 Muscle weakness (generalized): Secondary | ICD-10-CM | POA: Diagnosis not present

## 2018-12-02 DIAGNOSIS — R1312 Dysphagia, oropharyngeal phase: Secondary | ICD-10-CM | POA: Diagnosis not present

## 2018-12-02 DIAGNOSIS — M1A9XX0 Chronic gout, unspecified, without tophus (tophi): Secondary | ICD-10-CM | POA: Diagnosis not present

## 2018-12-02 DIAGNOSIS — R262 Difficulty in walking, not elsewhere classified: Secondary | ICD-10-CM | POA: Diagnosis not present

## 2018-12-02 DIAGNOSIS — D649 Anemia, unspecified: Secondary | ICD-10-CM | POA: Diagnosis not present

## 2018-12-03 DIAGNOSIS — I509 Heart failure, unspecified: Secondary | ICD-10-CM | POA: Diagnosis not present

## 2018-12-03 DIAGNOSIS — M6281 Muscle weakness (generalized): Secondary | ICD-10-CM | POA: Diagnosis not present

## 2018-12-03 DIAGNOSIS — M1A9XX0 Chronic gout, unspecified, without tophus (tophi): Secondary | ICD-10-CM | POA: Diagnosis not present

## 2018-12-03 DIAGNOSIS — G4733 Obstructive sleep apnea (adult) (pediatric): Secondary | ICD-10-CM | POA: Diagnosis not present

## 2018-12-03 DIAGNOSIS — R262 Difficulty in walking, not elsewhere classified: Secondary | ICD-10-CM | POA: Diagnosis not present

## 2018-12-04 ENCOUNTER — Encounter: Payer: Self-pay | Admitting: Cardiology

## 2018-12-04 DIAGNOSIS — M1A9XX0 Chronic gout, unspecified, without tophus (tophi): Secondary | ICD-10-CM | POA: Diagnosis not present

## 2018-12-04 DIAGNOSIS — R262 Difficulty in walking, not elsewhere classified: Secondary | ICD-10-CM | POA: Diagnosis not present

## 2018-12-04 DIAGNOSIS — M6281 Muscle weakness (generalized): Secondary | ICD-10-CM | POA: Diagnosis not present

## 2018-12-04 NOTE — Progress Notes (Signed)
Remote ICD transmission.   

## 2018-12-06 DIAGNOSIS — M1A9XX0 Chronic gout, unspecified, without tophus (tophi): Secondary | ICD-10-CM | POA: Diagnosis not present

## 2018-12-06 DIAGNOSIS — R262 Difficulty in walking, not elsewhere classified: Secondary | ICD-10-CM | POA: Diagnosis not present

## 2018-12-06 DIAGNOSIS — M6281 Muscle weakness (generalized): Secondary | ICD-10-CM | POA: Diagnosis not present

## 2018-12-07 DIAGNOSIS — I10 Essential (primary) hypertension: Secondary | ICD-10-CM | POA: Diagnosis not present

## 2018-12-07 DIAGNOSIS — L8990 Pressure ulcer of unspecified site, unspecified stage: Secondary | ICD-10-CM | POA: Diagnosis not present

## 2018-12-07 DIAGNOSIS — M1A9XX0 Chronic gout, unspecified, without tophus (tophi): Secondary | ICD-10-CM | POA: Diagnosis not present

## 2018-12-07 DIAGNOSIS — E119 Type 2 diabetes mellitus without complications: Secondary | ICD-10-CM | POA: Diagnosis not present

## 2018-12-07 DIAGNOSIS — R262 Difficulty in walking, not elsewhere classified: Secondary | ICD-10-CM | POA: Diagnosis not present

## 2018-12-07 DIAGNOSIS — M6281 Muscle weakness (generalized): Secondary | ICD-10-CM | POA: Diagnosis not present

## 2018-12-07 DIAGNOSIS — J449 Chronic obstructive pulmonary disease, unspecified: Secondary | ICD-10-CM | POA: Diagnosis not present

## 2018-12-08 DIAGNOSIS — M1A9XX0 Chronic gout, unspecified, without tophus (tophi): Secondary | ICD-10-CM | POA: Diagnosis not present

## 2018-12-08 DIAGNOSIS — M6281 Muscle weakness (generalized): Secondary | ICD-10-CM | POA: Diagnosis not present

## 2018-12-08 DIAGNOSIS — R262 Difficulty in walking, not elsewhere classified: Secondary | ICD-10-CM | POA: Diagnosis not present

## 2018-12-09 DIAGNOSIS — M7989 Other specified soft tissue disorders: Secondary | ICD-10-CM | POA: Diagnosis not present

## 2018-12-09 DIAGNOSIS — R262 Difficulty in walking, not elsewhere classified: Secondary | ICD-10-CM | POA: Diagnosis not present

## 2018-12-09 DIAGNOSIS — R600 Localized edema: Secondary | ICD-10-CM | POA: Diagnosis not present

## 2018-12-09 DIAGNOSIS — I517 Cardiomegaly: Secondary | ICD-10-CM | POA: Diagnosis not present

## 2018-12-09 DIAGNOSIS — M1A9XX0 Chronic gout, unspecified, without tophus (tophi): Secondary | ICD-10-CM | POA: Diagnosis not present

## 2018-12-09 DIAGNOSIS — M6281 Muscle weakness (generalized): Secondary | ICD-10-CM | POA: Diagnosis not present

## 2018-12-09 DIAGNOSIS — R918 Other nonspecific abnormal finding of lung field: Secondary | ICD-10-CM | POA: Diagnosis not present

## 2018-12-10 DIAGNOSIS — R7989 Other specified abnormal findings of blood chemistry: Secondary | ICD-10-CM | POA: Diagnosis not present

## 2018-12-10 DIAGNOSIS — D649 Anemia, unspecified: Secondary | ICD-10-CM | POA: Diagnosis not present

## 2018-12-10 DIAGNOSIS — E039 Hypothyroidism, unspecified: Secondary | ICD-10-CM | POA: Diagnosis not present

## 2018-12-10 DIAGNOSIS — I10 Essential (primary) hypertension: Secondary | ICD-10-CM | POA: Diagnosis not present

## 2018-12-10 DIAGNOSIS — M1A9XX0 Chronic gout, unspecified, without tophus (tophi): Secondary | ICD-10-CM | POA: Diagnosis not present

## 2018-12-10 DIAGNOSIS — R262 Difficulty in walking, not elsewhere classified: Secondary | ICD-10-CM | POA: Diagnosis not present

## 2018-12-10 DIAGNOSIS — M6281 Muscle weakness (generalized): Secondary | ICD-10-CM | POA: Diagnosis not present

## 2018-12-11 DIAGNOSIS — D649 Anemia, unspecified: Secondary | ICD-10-CM | POA: Diagnosis not present

## 2018-12-11 DIAGNOSIS — M6281 Muscle weakness (generalized): Secondary | ICD-10-CM | POA: Diagnosis not present

## 2018-12-11 DIAGNOSIS — R262 Difficulty in walking, not elsewhere classified: Secondary | ICD-10-CM | POA: Diagnosis not present

## 2018-12-11 DIAGNOSIS — M1A9XX0 Chronic gout, unspecified, without tophus (tophi): Secondary | ICD-10-CM | POA: Diagnosis not present

## 2018-12-11 DIAGNOSIS — I10 Essential (primary) hypertension: Secondary | ICD-10-CM | POA: Diagnosis not present

## 2018-12-11 DIAGNOSIS — R0602 Shortness of breath: Secondary | ICD-10-CM | POA: Diagnosis not present

## 2018-12-14 DIAGNOSIS — M1A9XX0 Chronic gout, unspecified, without tophus (tophi): Secondary | ICD-10-CM | POA: Diagnosis not present

## 2018-12-14 DIAGNOSIS — M6281 Muscle weakness (generalized): Secondary | ICD-10-CM | POA: Diagnosis not present

## 2018-12-14 DIAGNOSIS — R262 Difficulty in walking, not elsewhere classified: Secondary | ICD-10-CM | POA: Diagnosis not present

## 2018-12-15 DIAGNOSIS — M1A9XX0 Chronic gout, unspecified, without tophus (tophi): Secondary | ICD-10-CM | POA: Diagnosis not present

## 2018-12-15 DIAGNOSIS — R262 Difficulty in walking, not elsewhere classified: Secondary | ICD-10-CM | POA: Diagnosis not present

## 2018-12-15 DIAGNOSIS — M6281 Muscle weakness (generalized): Secondary | ICD-10-CM | POA: Diagnosis not present

## 2018-12-16 DIAGNOSIS — M6281 Muscle weakness (generalized): Secondary | ICD-10-CM | POA: Diagnosis not present

## 2018-12-16 DIAGNOSIS — R262 Difficulty in walking, not elsewhere classified: Secondary | ICD-10-CM | POA: Diagnosis not present

## 2018-12-16 DIAGNOSIS — M1A9XX0 Chronic gout, unspecified, without tophus (tophi): Secondary | ICD-10-CM | POA: Diagnosis not present

## 2018-12-17 DIAGNOSIS — M1A9XX0 Chronic gout, unspecified, without tophus (tophi): Secondary | ICD-10-CM | POA: Diagnosis not present

## 2018-12-17 DIAGNOSIS — R262 Difficulty in walking, not elsewhere classified: Secondary | ICD-10-CM | POA: Diagnosis not present

## 2018-12-17 DIAGNOSIS — M6281 Muscle weakness (generalized): Secondary | ICD-10-CM | POA: Diagnosis not present

## 2018-12-18 DIAGNOSIS — M1A9XX0 Chronic gout, unspecified, without tophus (tophi): Secondary | ICD-10-CM | POA: Diagnosis not present

## 2018-12-18 DIAGNOSIS — M6281 Muscle weakness (generalized): Secondary | ICD-10-CM | POA: Diagnosis not present

## 2018-12-18 DIAGNOSIS — L89610 Pressure ulcer of right heel, unstageable: Secondary | ICD-10-CM | POA: Diagnosis not present

## 2018-12-18 DIAGNOSIS — L89622 Pressure ulcer of left heel, stage 2: Secondary | ICD-10-CM | POA: Diagnosis not present

## 2018-12-18 DIAGNOSIS — R339 Retention of urine, unspecified: Secondary | ICD-10-CM | POA: Diagnosis not present

## 2018-12-18 DIAGNOSIS — R262 Difficulty in walking, not elsewhere classified: Secondary | ICD-10-CM | POA: Diagnosis not present

## 2018-12-21 DIAGNOSIS — M1A9XX0 Chronic gout, unspecified, without tophus (tophi): Secondary | ICD-10-CM | POA: Diagnosis not present

## 2018-12-21 DIAGNOSIS — R262 Difficulty in walking, not elsewhere classified: Secondary | ICD-10-CM | POA: Diagnosis not present

## 2018-12-21 DIAGNOSIS — M6281 Muscle weakness (generalized): Secondary | ICD-10-CM | POA: Diagnosis not present

## 2018-12-22 DIAGNOSIS — M6281 Muscle weakness (generalized): Secondary | ICD-10-CM | POA: Diagnosis not present

## 2018-12-22 DIAGNOSIS — M1A9XX0 Chronic gout, unspecified, without tophus (tophi): Secondary | ICD-10-CM | POA: Diagnosis not present

## 2018-12-22 DIAGNOSIS — R262 Difficulty in walking, not elsewhere classified: Secondary | ICD-10-CM | POA: Diagnosis not present

## 2018-12-23 DIAGNOSIS — M1A9XX0 Chronic gout, unspecified, without tophus (tophi): Secondary | ICD-10-CM | POA: Diagnosis not present

## 2018-12-23 DIAGNOSIS — I5022 Chronic systolic (congestive) heart failure: Secondary | ICD-10-CM | POA: Diagnosis not present

## 2018-12-23 DIAGNOSIS — R262 Difficulty in walking, not elsewhere classified: Secondary | ICD-10-CM | POA: Diagnosis not present

## 2018-12-23 DIAGNOSIS — M6281 Muscle weakness (generalized): Secondary | ICD-10-CM | POA: Diagnosis not present

## 2018-12-23 DIAGNOSIS — E119 Type 2 diabetes mellitus without complications: Secondary | ICD-10-CM | POA: Diagnosis not present

## 2018-12-24 DIAGNOSIS — M6281 Muscle weakness (generalized): Secondary | ICD-10-CM | POA: Diagnosis not present

## 2018-12-24 DIAGNOSIS — M1A9XX0 Chronic gout, unspecified, without tophus (tophi): Secondary | ICD-10-CM | POA: Diagnosis not present

## 2018-12-24 DIAGNOSIS — M7989 Other specified soft tissue disorders: Secondary | ICD-10-CM | POA: Diagnosis not present

## 2018-12-24 DIAGNOSIS — R262 Difficulty in walking, not elsewhere classified: Secondary | ICD-10-CM | POA: Diagnosis not present

## 2018-12-25 DIAGNOSIS — M6281 Muscle weakness (generalized): Secondary | ICD-10-CM | POA: Diagnosis not present

## 2018-12-25 DIAGNOSIS — M1A9XX0 Chronic gout, unspecified, without tophus (tophi): Secondary | ICD-10-CM | POA: Diagnosis not present

## 2018-12-25 DIAGNOSIS — R0902 Hypoxemia: Secondary | ICD-10-CM | POA: Diagnosis not present

## 2018-12-25 DIAGNOSIS — G4733 Obstructive sleep apnea (adult) (pediatric): Secondary | ICD-10-CM | POA: Diagnosis not present

## 2018-12-25 DIAGNOSIS — R262 Difficulty in walking, not elsewhere classified: Secondary | ICD-10-CM | POA: Diagnosis not present

## 2018-12-28 DIAGNOSIS — M1A9XX0 Chronic gout, unspecified, without tophus (tophi): Secondary | ICD-10-CM | POA: Diagnosis not present

## 2018-12-28 DIAGNOSIS — M6281 Muscle weakness (generalized): Secondary | ICD-10-CM | POA: Diagnosis not present

## 2018-12-28 DIAGNOSIS — R262 Difficulty in walking, not elsewhere classified: Secondary | ICD-10-CM | POA: Diagnosis not present

## 2018-12-29 DIAGNOSIS — M6281 Muscle weakness (generalized): Secondary | ICD-10-CM | POA: Diagnosis not present

## 2018-12-29 DIAGNOSIS — R262 Difficulty in walking, not elsewhere classified: Secondary | ICD-10-CM | POA: Diagnosis not present

## 2018-12-29 DIAGNOSIS — M1A9XX0 Chronic gout, unspecified, without tophus (tophi): Secondary | ICD-10-CM | POA: Diagnosis not present

## 2018-12-30 DIAGNOSIS — M6281 Muscle weakness (generalized): Secondary | ICD-10-CM | POA: Diagnosis not present

## 2018-12-30 DIAGNOSIS — M1A9XX0 Chronic gout, unspecified, without tophus (tophi): Secondary | ICD-10-CM | POA: Diagnosis not present

## 2018-12-30 DIAGNOSIS — R262 Difficulty in walking, not elsewhere classified: Secondary | ICD-10-CM | POA: Diagnosis not present

## 2018-12-31 DIAGNOSIS — M6281 Muscle weakness (generalized): Secondary | ICD-10-CM | POA: Diagnosis not present

## 2018-12-31 DIAGNOSIS — M1A9XX0 Chronic gout, unspecified, without tophus (tophi): Secondary | ICD-10-CM | POA: Diagnosis not present

## 2018-12-31 DIAGNOSIS — R262 Difficulty in walking, not elsewhere classified: Secondary | ICD-10-CM | POA: Diagnosis not present

## 2019-01-01 DIAGNOSIS — M1A9XX0 Chronic gout, unspecified, without tophus (tophi): Secondary | ICD-10-CM | POA: Diagnosis not present

## 2019-01-01 DIAGNOSIS — M6281 Muscle weakness (generalized): Secondary | ICD-10-CM | POA: Diagnosis not present

## 2019-01-01 DIAGNOSIS — R262 Difficulty in walking, not elsewhere classified: Secondary | ICD-10-CM | POA: Diagnosis not present

## 2019-02-10 ENCOUNTER — Ambulatory Visit (INDEPENDENT_AMBULATORY_CARE_PROVIDER_SITE_OTHER): Payer: Medicare Other | Admitting: Urology

## 2019-02-10 DIAGNOSIS — R339 Retention of urine, unspecified: Secondary | ICD-10-CM

## 2019-08-11 ENCOUNTER — Ambulatory Visit: Payer: Medicare Other | Admitting: Urology

## 2019-08-18 ENCOUNTER — Ambulatory Visit (INDEPENDENT_AMBULATORY_CARE_PROVIDER_SITE_OTHER): Payer: Medicare Other | Admitting: Urology

## 2019-08-18 ENCOUNTER — Other Ambulatory Visit: Payer: Self-pay

## 2019-08-18 ENCOUNTER — Encounter: Payer: Self-pay | Admitting: Urology

## 2019-08-18 VITALS — BP 105/71 | HR 71 | Temp 97.3°F | Ht 70.0 in | Wt 179.0 lb

## 2019-08-18 DIAGNOSIS — R339 Retention of urine, unspecified: Secondary | ICD-10-CM

## 2019-08-18 NOTE — Progress Notes (Signed)
08/18/2019 11:23 AM   Frank Hoover 10-10-1950 [id number]  Referring provider: Sinda Du, MD No address on file  Urinary retention  HPI: Frank Hoover is a 69yo here for followup for urianry retention. The patient is doing well with an indwelling foley. No pain from the foley catheter. NO UTIs. No hematuria.    PMH: Past Medical History:  Diagnosis Date  . Adenomatous colon polyp 10/30/09   Serrated adenoma removed during Colonoscopy   . AICD (automatic cardioverter/defibrillator) present 06/11/2016  . Arthritis    "legs" (09/27/2015)  . Atrial flutter with rapid ventricular response ([place]) 09/27/2015  . CHF (congestive heart failure) ([place])   . CKD (chronic kidney disease) stage 3, GFR 30-59 ml/min   . COPD (chronic obstructive pulmonary disease) ([place])   . Diverticulosis of colon   . Dysrhythmia   . Essential hypertension   . Gastroparesis   . GERD (gastroesophageal reflux disease)   . Gout   . Hyperlipemia   . Hypertension   . OA (osteoarthritis)   . Type 2 diabetes mellitus [facility])     Surgical History: Past Surgical History:  Procedure Laterality Date  . CARDIOVERSION N/A 08/22/2015   Procedure: CARDIOVERSION;  Surgeon: Larey Dresser, MD;  Location: [place];  Service: Cardiovascular;  Laterality: N/A;  . CARDIOVERSION N/A 09/01/2015   Procedure: CARDIOVERSION;  Surgeon: Lelon Perla, MD;  Location: [place];  Service: Cardiovascular;  Laterality: N/A;  . CATARACT EXTRACTION W/ INTRAOCULAR LENS  IMPLANT, BILATERAL Bilateral   . COLONOSCOPY  10/30/09   DJT:[id number] diverticulum/serrated adenoma from ICV, next colonoscopy due 10/2012  . COLONOSCOPY N/A 09/18/2012   ZES:PQZRAQT mucosa that was seen appeared normal, however most of it was not seen due to be very poor prep  . COLONOSCOPY N/A 04/08/2013   MAU:QJFHLKT polyp removed/inadequate preparation  . COLONOSCOPY N/A 06/22/2014   Procedure: COLONOSCOPY;  Surgeon: Daneil Dolin, MD;  [facility]  ENDO [facility];  Service: Endoscopy;  Laterality: N/A;  115   . ELECTROPHYSIOLOGIC STUDY N/A 09/27/2015   Procedure: SVT Ablation;  Surgeon: Evans Lance, MD;  Location: [facility] CV LAB;  Service: Cardiovascular;  Laterality: N/A;  . EP IMPLANTABLE DEVICE  06/11/2016  . ESOPHAGOGASTRODUODENOSCOPY  10/30/09   GYB:WLSLHT   . FLEXIBLE SIGMOIDOSCOPY N/A 08/25/2015   Procedure: FLEXIBLE SIGMOIDOSCOPY;  Surgeon: Jerene Bears, MD;  Location: [facility] ENDOSCOPY;  Service: Endoscopy;  Laterality: N/A;  . ICD IMPLANT N/A 06/11/2016   Procedure: ICD Implant;  Surgeon: Evans Lance, MD;  Location: [place] CV LAB;  Service: Cardiovascular;  Laterality: N/A;  . TEE WITHOUT CARDIOVERSION N/A 08/22/2015   Procedure: TRANSESOPHAGEAL ECHOCARDIOGRAM (TEE);  Surgeon: Larey Dresser, MD;  Location: [facility];  Service: Cardiovascular;  Laterality: N/A;  . TEE WITHOUT CARDIOVERSION  09/27/2015   Procedure: Transesophageal Echocardiogram (Tee);  Surgeon: Evans Lance, MD;  Location: [place] CV LAB;  Service: Cardiovascular;;  . TONSILLECTOMY  1950s/1960s    Home Medications:  Allergies as of 08/18/2019      Reactions   Peanut Butter Flavor       Medication List       Accurate as of August 18, 2019 11:23 AM. If you have any questions, ask your nurse or doctor.        acetaminophen 500 MG tablet Commonly known as: TYLENOL Take 1,000 mg by mouth daily as needed for moderate pain or headache.   acetaminophen 325 MG tablet Commonly known as: TYLENOL Take 2 tablets (  650 mg total) by mouth every 6 (six) hours as needed for mild pain (or Fever >/= 101).   allopurinol 100 MG tablet Commonly known as: ZYLOPRIM Take 2 tablets (200 mg total) by mouth daily.   alum & mag hydroxide-simeth 200-200-20 MG/5ML suspension Commonly known as: MAALOX/MYLANTA Take 15 mLs by mouth every 6 (six) hours as needed for indigestion or heartburn.   aspirin 81 MG chewable tablet Chew 81 mg by mouth every morning.     atorvastatin 20 MG tablet Commonly known as: LIPITOR Take 20 mg by mouth at bedtime.   cholecalciferol 1000 units tablet Commonly known as: VITAMIN D Take 1,000 Units by mouth daily.   dextrose 40 % Gel Commonly known as: GLUTOSE Take 1 Tube by mouth.   docusate sodium 100 MG capsule Commonly known as: COLACE Take 2 capsules (200 mg total) by mouth at bedtime.   fluticasone 50 MCG/ACT nasal spray Commonly known as: FLONASE Place 2 sprays into both nostrils daily.   gabapentin 400 MG capsule Commonly known as: NEURONTIN Take 400 mg by mouth daily.   HumuLIN 70/30 KwikPen (70-30) 100 UNIT/ML KwikPen Generic drug: insulin isophane & regular human   hydrALAZINE 100 MG tablet Commonly known as: APRESOLINE Take 1 tablet (100 mg total) by mouth 3 (three) times daily.   HYDROcodone-acetaminophen 5-325 MG tablet Commonly known as: NORCO/VICODIN Take 1 tablet by mouth every 6 (six) hours as needed.   insulin aspart 100 UNIT/ML injection Commonly known as: novoLOG Inject 0-20 Units into the skin 3 (three) times daily with meals.   insulin aspart 100 UNIT/ML injection Commonly known as: novoLOG Inject 0-5 Units into the skin at bedtime.   insulin glargine 100 UNIT/ML injection Commonly known as: LANTUS Inject 0.25 mLs (25 Units total) into the skin daily.   isosorbide mononitrate 30 MG 24 hr tablet Commonly known as: IMDUR TAKE 1 TABLET DAILY   linaclotide 145 MCG Caps capsule Commonly known as: LINZESS Take 1 capsule (145 mcg total) by mouth daily before breakfast.   loratadine 10 MG tablet Commonly known as: CLARITIN Take 10 mg by mouth daily.   metoCLOPramide 5 MG tablet Commonly known as: REGLAN Take 5 mg by mouth 3 (three) times daily before meals.   metoprolol succinate 100 MG 24 hr tablet Commonly known as: TOPROL-XL Take 1 tablet (100 mg total) by mouth 2 (two) times a day. Take with or immediately following a meal.   metoprolol succinate 100 MG 24 hr  tablet Commonly known as: TOPROL-XL Take by mouth.   Milk of Magnesia 400 MG/5ML suspension Generic drug: magnesium hydroxide Take by mouth daily as needed for mild constipation.   multivitamin with minerals Tabs tablet Take 1 tablet by mouth daily.   ondansetron 4 MG tablet Commonly known as: ZOFRAN Take 1 tablet (4 mg total) by mouth every 8 (eight) hours as needed for nausea or vomiting.   pantoprazole 40 MG tablet Commonly known as: PROTONIX Take 1 tablet (40 mg total) by mouth daily.   potassium chloride SA 20 MEQ tablet Commonly known as: KLOR-CON Take by mouth.   ProAir HFA 108 (90 Base) MCG/ACT inhaler Generic drug: albuterol Inhale 2 puffs into the lungs every 6 (six) hours as needed for wheezing or shortness of breath.   sertraline 25 MG tablet Commonly known as: ZOLOFT Take 25 mg by mouth daily.   tamsulosin 0.4 MG Caps capsule Commonly known as: FLOMAX Take 1 capsule by mouth daily.   torsemide 20 MG tablet Commonly known  as: DEMADEX Take 20 mg by mouth daily.   traZODone 50 MG tablet Commonly known as: DESYREL Take 0.5 tablets (25 mg total) by mouth at bedtime as needed for sleep.   vitamin C 500 MG tablet Commonly known as: ASCORBIC ACID Take 500 mg by mouth 2 (two) times daily.       Allergies:  Allergies  Allergen Reactions  . Peanut Butter Flavor     Family History: Family History  Problem Relation Age of Onset  . COPD Father   . Diabetes Mother   . Hypertension Mother   . Colon cancer Neg Hx     Social History:  reports that he quit smoking about 8 years ago. His smoking use included cigarettes. He has a 4.00 pack-year smoking history. He has never used smokeless tobacco. He reports that he does not drink alcohol and does not use drugs.  ROS: All other review of systems were reviewed and are negative except what is noted above in HPI  Physical Exam: BP 105/71   Pulse 71   Temp (!) 97.3 F (36.3 C)   Ht 5' 10"  (1.778 m)   Wt  179 lb (81.2 kg)   BMI 25.68 kg/m   Constitutional:  Alert and oriented, No acute distress. HEENT: [place] AT, moist mucus membranes.  Trachea midline, no masses. Cardiovascular: No clubbing, cyanosis, or edema. Respiratory: Normal respiratory effort, no increased work of breathing. GI: Abdomen is soft, nontender, nondistended, no abdominal masses GU: No CVA tenderness.  Lymph: No cervical or inguinal lymphadenopathy. Skin: No rashes, bruises or suspicious lesions. Neurologic: Grossly intact, no focal deficits, moving all 4 extremities. Psychiatric: Normal mood and affect.  Laboratory Data: Lab Results  Component Value Date   WBC 7.3 09/08/2018   HGB 12.2 (L) 09/08/2018   HCT 36.3 (L) 09/08/2018   MCV 89.9 09/08/2018   PLT 251 09/08/2018    Lab Results  Component Value Date   CREATININE 1.61 (H) 09/12/2018    No results found for: PSA  No results found for: TESTOSTERONE  Lab Results  Component Value Date   HGBA1C 7.2 (H) 07/31/2018    Urinalysis    Component Value Date/Time   COLORURINE AMBER (A) 09/07/2018 2227   APPEARANCEUR CLOUDY (A) 09/07/2018 2227   LABSPEC 1.015 09/07/2018 2227   PHURINE 5.0 09/07/2018 2227   GLUCOSEU NEGATIVE 09/07/2018 2227   HGBUR NEGATIVE 09/07/2018 2227   BILIRUBINUR NEGATIVE 09/07/2018 2227   KETONESUR NEGATIVE 09/07/2018 2227   PROTEINUR 30 (A) 09/07/2018 2227   UROBILINOGEN 0.2 12/02/2013 1739   NITRITE NEGATIVE 09/07/2018 2227   LEUKOCYTESUR LARGE (A) 09/07/2018 2227    Lab Results  Component Value Date   BACTERIA MANY (A) 09/07/2018    Pertinent Imaging:  No results found for this or any previous visit.  No results found for this or any previous visit.  No results found for this or any previous visit.  No results found for this or any previous visit.  Results for orders placed during the hospital encounter of 09/07/18  US RENAL  Narrative CLINICAL DATA:  Acute renal failure.  EXAM: RENAL / URINARY TRACT  ULTRASOUND COMPLETE  COMPARISON:  None.  FINDINGS: Right Kidney:  Renal measurements: 7.9 x 4.6 x 5.3 cm = volume: 99.8 mL . Echogenicity within normal limits. No mass or hydronephrosis visualized.  Left Kidney:  Renal measurements: 8.7 x 4.7 x 5.6 cm = volume: 120.0 mL. Echogenicity within normal limits. No mass or hydronephrosis visualized.  Bladder:  Decompressed with Foley catheter.  IMPRESSION: No hydronephrosis.   Electronically Signed By: Lovey Newcomer M.D. On: 09/08/2018 14:14  No results found for this or any previous visit.  No results found for this or any previous visit.  No results found for this or any previous visit.   Assessment & Plan:    1. Urinary retention -We discussed indwelling foley, CIC, and SP tube and the patient elects fro chronic indwelling foley. He should continue monthly foley changes. RTC 1 year with MD   No follow-ups on file.  Nicolette Bang, MD  [facility] [place]

## 2019-08-18 NOTE — Progress Notes (Signed)
Urological Symptom Review  Patient is experiencing the following symptoms: None   Review of Systems  Gastrointestinal (upper)  : Negative for upper GI symptoms  Gastrointestinal (lower) : Negative for lower GI symptoms  Constitutional : Negative for symptoms  Skin: Negative for skin symptoms  Eyes: Negative for eye symptoms  Ear/Nose/Throat : Negative for Ear/Nose/Throat symptoms  Hematologic/Lymphatic: Negative for Hematologic/Lymphatic symptoms  Cardiovascular : L leg pain  Respiratory : Negative for respiratory symptoms  Endocrine: Negative for endocrine symptoms  Musculoskeletal: Negative for musculoskeletal symptoms  Neurological: Negative for neurological symptoms  Psychologic: Negative for psychiatric symptoms

## 2019-08-18 NOTE — Patient Instructions (Signed)
Acute Urinary Retention, Male  Acute urinary retention means that you cannot pee (urinate) at all, or that you pee too little and your bladder is not emptied completely. If it is not treated, it can lead to kidney damage or other serious problems. Follow these instructions at home:  Take over-the-counter and prescription medicines only as told by your doctor. Ask your doctor what medicines you should stay away from. Do not take any medicine unless your doctor says it is okay to do so.  If you were sent home with a tube that drains the bladder (catheter), take care of it as told by your doctor.  Drink enough fluid to keep your pee clear or pale yellow.  If you were given an antibiotic, take it as told by your doctor. Do not stop taking the antibiotic even if you start to feel better.  Do not use any products that contain nicotine or tobacco, such as cigarettes and e-cigarettes. If you need help quitting, ask your doctor.  Watch for changes in your symptoms. Tell your doctor about them.  If told, track changes in your blood pressure at home. Tell your doctor about them.  Keep all follow-up visits as told by your doctor. This is important. Contact a doctor if:  You have spasms or you leak pee when you have spasms. Get help right away if:  You have chills or a fever.  You have a tube that drains the bladder and: ? The tube stops draining pee. ? The tube falls out.  You have blood in your pee. Summary  Acute urinary retention means that you have problems peeing. It may mean that you cannot pee at all, or that you pee too little.  If this condition is not treated, it can lead to kidney damage or other serious problems.  If you were sent home with a tube that drains the bladder, take care of it as told by your doctor.  Monitor any changes in your symptoms. Tell your doctor about any changes. This information is not intended to replace advice given to you by your health care  provider. Make sure you discuss any questions you have with your health care provider. Document Revised: 05/07/2018 Document Reviewed: 03/22/2016 Elsevier Patient Education  [place].

## 2019-12-10 ENCOUNTER — Encounter: Payer: Self-pay | Admitting: Urology

## 2019-12-10 ENCOUNTER — Ambulatory Visit (INDEPENDENT_AMBULATORY_CARE_PROVIDER_SITE_OTHER): Payer: Medicare Other | Admitting: Urology

## 2019-12-10 ENCOUNTER — Other Ambulatory Visit: Payer: Self-pay

## 2019-12-10 VITALS — BP 105/70 | HR 73 | Temp 99.3°F | Ht 70.0 in | Wt 179.0 lb

## 2019-12-10 DIAGNOSIS — R310 Gross hematuria: Secondary | ICD-10-CM

## 2019-12-10 MED ORDER — ACETIC ACID 0.25 % IR SOLN: Freq: Every day | 12 refills | Status: DC

## 2019-12-10 NOTE — Progress Notes (Signed)
Urological Symptom Review  Patient is experiencing the following symptoms: None   Review of Systems  Gastrointestinal (upper)  : Negative for upper GI symptoms  Gastrointestinal (lower) : Negative for lower GI symptoms  Constitutional : Negative for symptoms  Skin: Negative for skin symptoms  Eyes: Negative for eye symptoms  Ear/Nose/Throat : Negative for Ear/Nose/Throat symptoms  Hematologic/Lymphatic: Negative for Hematologic/Lymphatic symptoms  Cardiovascular : Negative for cardiovascular symptoms  Respiratory : Negative for respiratory symptoms  Endocrine: Negative for endocrine symptoms  Musculoskeletal: Negative for musculoskeletal symptoms  Neurological: Negative for neurological symptoms  Psychologic: Negative for psychiatric symptoms

## 2019-12-10 NOTE — Progress Notes (Signed)
12/10/2019 2:56 PM   Frank Hoover [birth year] [id number]  Referring provider: Sinda Du, MD No address on file  Gross hematuria  HPI: Frank Hoover is a 69yo seen today for gross hematuria. He presented to the ER [facility] [place] with gross hematuria and was treated for a UTI. THe urine has since clear. He is having issues with sediment and it needs to be flushed every day. No pain currently.    PMH: Past Medical History:  Diagnosis Date  . Adenomatous colon polyp 10/30/09   Serrated adenoma removed during Colonoscopy   . AICD (automatic cardioverter/defibrillator) present 06/11/2016  . Arthritis    "legs" (09/27/2015)  . Atrial flutter with rapid ventricular response ([place]) 09/27/2015  . CHF (congestive heart failure) ([place])   . CKD (chronic kidney disease) stage 3, GFR 30-59 ml/min (HCC)   . COPD (chronic obstructive pulmonary disease) ([place])   . Diverticulosis of colon   . Dysrhythmia   . Essential hypertension   . Gastroparesis   . GERD (gastroesophageal reflux disease)   . Gout   . Hyperlipemia   . Hypertension   . OA (osteoarthritis)   . Type 2 diabetes mellitus [facility])     Surgical History: Past Surgical History:  Procedure Laterality Date  . CARDIOVERSION N/A 08/22/2015   Procedure: CARDIOVERSION;  Surgeon: Larey Dresser, MD;  Location: [place];  Service: Cardiovascular;  Laterality: N/A;  . CARDIOVERSION N/A 09/01/2015   Procedure: CARDIOVERSION;  Surgeon: Lelon Perla, MD;  Location: [place];  Service: Cardiovascular;  Laterality: N/A;  . CATARACT EXTRACTION W/ INTRAOCULAR LENS  IMPLANT, BILATERAL Bilateral   . COLONOSCOPY  10/30/09   QIO:[id number] diverticulum/serrated adenoma from ICV, next colonoscopy due 10/2012  . COLONOSCOPY N/A 09/18/2012   LKG:MWNUUVO mucosa that was seen appeared normal, however most of it was not seen due to be very poor prep  . COLONOSCOPY N/A 04/08/2013   ZDG:UYQIHKV polyp removed/inadequate preparation  .  COLONOSCOPY N/A 06/22/2014   Procedure: COLONOSCOPY;  Surgeon: Daneil Dolin, MD;  [facility];  Service: Endoscopy;  Laterality: N/A;  115   . ELECTROPHYSIOLOGIC STUDY N/A 09/27/2015   Procedure: SVT Ablation;  Surgeon: Evans Lance, MD;  Location: [place] CV LAB;  Service: Cardiovascular;  Laterality: N/A;  . EP IMPLANTABLE DEVICE  06/11/2016  . ESOPHAGOGASTRODUODENOSCOPY  10/30/09   QQV:ZDGLOV   . FLEXIBLE SIGMOIDOSCOPY N/A 08/25/2015   Procedure: FLEXIBLE SIGMOIDOSCOPY;  Surgeon: Jerene Bears, MD;  Location: [facility] ENDOSCOPY;  Service: Endoscopy;  Laterality: N/A;  . ICD IMPLANT N/A 06/11/2016   Procedure: ICD Implant;  Surgeon: Evans Lance, MD;  Location: [place] CV LAB;  Service: Cardiovascular;  Laterality: N/A;  . TEE WITHOUT CARDIOVERSION N/A 08/22/2015   Procedure: TRANSESOPHAGEAL ECHOCARDIOGRAM (TEE);  Surgeon: Larey Dresser, MD;  Location: [place];  Service: Cardiovascular;  Laterality: N/A;  . TEE WITHOUT CARDIOVERSION  09/27/2015   Procedure: Transesophageal Echocardiogram (Tee);  Surgeon: Evans Lance, MD;  Location: [place] CV LAB;  Service: Cardiovascular;;  . TONSILLECTOMY  1950s/1960s    Home Medications:  Allergies as of 12/10/2019      Reactions   Peanut Butter Flavor       Medication List       Accurate as of December 10, 2019  2:56 PM. If you have any questions, ask your nurse or doctor.        acetaminophen 500 MG tablet Commonly known as: TYLENOL Take 1,000 mg by mouth daily  as needed for moderate pain or headache.   acetaminophen 325 MG tablet Commonly known as: TYLENOL Take 2 tablets (650 mg total) by mouth every 6 (six) hours as needed for mild pain (or Fever >/= 101).   allopurinol 100 MG tablet Commonly known as: ZYLOPRIM Take 2 tablets (200 mg total) by mouth daily.   alum & mag hydroxide-simeth 200-200-20 MG/5ML suspension Commonly known as: MAALOX/MYLANTA Take 15 mLs by mouth every 6 (six) hours as needed for  indigestion or heartburn.   aspirin 81 MG chewable tablet Chew by mouth daily.   aspirin 81 MG chewable tablet Chew 81 mg by mouth every morning.   atorvastatin 20 MG tablet Commonly known as: LIPITOR Take 20 mg by mouth at bedtime.   cholecalciferol 1000 units tablet Commonly known as: VITAMIN D Take 1,000 Units by mouth daily.   dextrose 40 % Gel Commonly known as: GLUTOSE Take 1 Tube by mouth.   docusate sodium 100 MG capsule Commonly known as: COLACE Take 2 capsules (200 mg total) by mouth at bedtime.   fluticasone 50 MCG/ACT nasal spray Commonly known as: FLONASE Place 2 sprays into both nostrils daily.   gabapentin 400 MG capsule Commonly known as: NEURONTIN Take 400 mg by mouth daily.   HumuLIN 70/30 KwikPen (70-30) 100 UNIT/ML KwikPen Generic drug: insulin isophane & regular human   hydrALAZINE 100 MG tablet Commonly known as: APRESOLINE Take 1 tablet (100 mg total) by mouth 3 (three) times daily.   HYDROcodone-acetaminophen 5-325 MG tablet Commonly known as: NORCO/VICODIN Take 1 tablet by mouth every 6 (six) hours as needed.   insulin aspart 100 UNIT/ML injection Commonly known as: novoLOG Inject 0-20 Units into the skin 3 (three) times daily with meals.   insulin aspart 100 UNIT/ML injection Commonly known as: novoLOG Inject 0-5 Units into the skin at bedtime.   insulin glargine 100 UNIT/ML injection Commonly known as: LANTUS Inject 0.25 mLs (25 Units total) into the skin daily.   isosorbide mononitrate 30 MG 24 hr tablet Commonly known as: IMDUR TAKE 1 TABLET DAILY   linaclotide 145 MCG Caps capsule Commonly known as: LINZESS Take 1 capsule (145 mcg total) by mouth daily before breakfast.   loratadine 10 MG tablet Commonly known as: CLARITIN Take 10 mg by mouth daily.   metoCLOPramide 5 MG tablet Commonly known as: REGLAN Take 5 mg by mouth 3 (three) times daily before meals.   metoprolol succinate 100 MG 24 hr tablet Commonly known  as: TOPROL-XL Take 1 tablet (100 mg total) by mouth 2 (two) times a day. Take with or immediately following a meal.   metoprolol succinate 100 MG 24 hr tablet Commonly known as: TOPROL-XL Take by mouth.   Milk of Magnesia 400 MG/5ML suspension Generic drug: magnesium hydroxide Take by mouth daily as needed for mild constipation.   multivitamin with minerals Tabs tablet Take 1 tablet by mouth daily.   ondansetron 4 MG tablet Commonly known as: ZOFRAN Take 1 tablet (4 mg total) by mouth every 8 (eight) hours as needed for nausea or vomiting.   pantoprazole 40 MG tablet Commonly known as: PROTONIX Take 1 tablet (40 mg total) by mouth daily.   potassium chloride SA 20 MEQ tablet Commonly known as: KLOR-CON Take by mouth.   albuterol 108 (90 Base) MCG/ACT inhaler Commonly known as: VENTOLIN HFA Inhale into the lungs every 6 (six) hours as needed for wheezing or shortness of breath.   ProAir HFA 108 (90 Base) MCG/ACT inhaler Generic drug: albuterol  Inhale 2 puffs into the lungs every 6 (six) hours as needed for wheezing or shortness of breath.   sertraline 25 MG tablet Commonly known as: ZOLOFT Take 25 mg by mouth daily.   tamsulosin 0.4 MG Caps capsule Commonly known as: FLOMAX Take 1 capsule by mouth daily.   torsemide 20 MG tablet Commonly known as: DEMADEX Take 20 mg by mouth daily.   traZODone 50 MG tablet Commonly known as: DESYREL Take 0.5 tablets (25 mg total) by mouth at bedtime as needed for sleep.   vitamin C 500 MG tablet Commonly known as: ASCORBIC ACID Take 500 mg by mouth 2 (two) times daily.       Allergies:  Allergies  Allergen Reactions  . Peanut Butter Flavor     Family History: Family History  Problem Relation Age of Onset  . COPD Father   . Diabetes Mother   . Hypertension Mother   . Colon cancer Neg Hx     Social History:  reports that he quit smoking about 9 years ago. His smoking use included cigarettes. He has a 4.00 pack-year  smoking history. He has never used smokeless tobacco. He reports that he does not drink alcohol and does not use drugs.  ROS: All other review of systems were reviewed and are negative except what is noted above in HPI  Physical Exam: BP 105/70   Pulse 73   Temp 99.3 F (37.4 C)   Ht 5' 10"  (1.778 m)   Wt 179 lb (81.2 kg)   BMI 25.68 kg/m   Constitutional:  Alert and oriented, No acute distress. HEENT: [place] AT, moist mucus membranes.  Trachea midline, no masses. Cardiovascular: No clubbing, cyanosis, or edema. Respiratory: Normal respiratory effort, no increased work of breathing. GI: Abdomen is soft, nontender, nondistended, no abdominal masses GU: No CVA tenderness.  Lymph: No cervical or inguinal lymphadenopathy. Skin: No rashes, bruises or suspicious lesions. Neurologic: Grossly intact, no focal deficits, moving all 4 extremities. Psychiatric: Normal mood and affect.  Laboratory Data: Lab Results  Component Value Date   WBC 7.3 09/08/2018   HGB 12.2 (L) 09/08/2018   HCT 36.3 (L) 09/08/2018   MCV 89.9 09/08/2018   PLT 251 09/08/2018    Lab Results  Component Value Date   CREATININE 1.61 (H) 09/12/2018    No results found for: PSA  No results found for: TESTOSTERONE  Lab Results  Component Value Date   HGBA1C 7.2 (H) 07/31/2018    Urinalysis    Component Value Date/Time   COLORURINE AMBER (A) 09/07/2018 2227   APPEARANCEUR CLOUDY (A) 09/07/2018 2227   LABSPEC 1.015 09/07/2018 2227   PHURINE 5.0 09/07/2018 2227   GLUCOSEU NEGATIVE 09/07/2018 2227   HGBUR NEGATIVE 09/07/2018 2227   BILIRUBINUR NEGATIVE 09/07/2018 2227   KETONESUR NEGATIVE 09/07/2018 2227   PROTEINUR 30 (A) 09/07/2018 2227   UROBILINOGEN 0.2 12/02/2013 1739   NITRITE NEGATIVE 09/07/2018 2227   LEUKOCYTESUR LARGE (A) 09/07/2018 2227    Lab Results  Component Value Date   BACTERIA MANY (A) 09/07/2018    Pertinent Imaging:  No results found for this or any previous visit.  No  results found for this or any previous visit.  No results found for this or any previous visit.  No results found for this or any previous visit.  Results for orders placed during the hospital encounter of 09/07/18  US RENAL  Narrative CLINICAL DATA:  Acute renal failure.  EXAM: RENAL / URINARY TRACT ULTRASOUND COMPLETE  COMPARISON:  None.  FINDINGS: Right Kidney:  Renal measurements: 7.9 x 4.6 x 5.3 cm = volume: 99.8 mL . Echogenicity within normal limits. No mass or hydronephrosis visualized.  Left Kidney:  Renal measurements: 8.7 x 4.7 x 5.6 cm = volume: 120.0 mL. Echogenicity within normal limits. No mass or hydronephrosis visualized.  Bladder:  Decompressed with Foley catheter.  IMPRESSION: No hydronephrosis.   Electronically Signed By: Lovey Newcomer M.D. On: 09/08/2018 14:14  No results found for this or any previous visit.  No results found for this or any previous visit.  No results found for this or any previous visit.   Assessment & Plan:    1. Gross hematuria -Likely related to UTI. We will start acetic acid flushes 60cc daily. Continue to change foley monthly at facility.   No follow-ups on file.  Nicolette Bang, MD  [facility] [place]

## 2019-12-10 NOTE — Patient Instructions (Signed)

## 2020-01-05 ENCOUNTER — Ambulatory Visit: Payer: Medicare Other | Admitting: Urology

## 2020-02-03 ENCOUNTER — Telehealth: Payer: Self-pay | Admitting: Family Medicine

## 2020-02-03 NOTE — Progress Notes (Signed)
Virtual Visit via Telephone Note   This visit type was conducted due to national recommendations for restrictions regarding the COVID-19 Pandemic (e.g. social distancing) in an effort to limit this patient's exposure and mitigate transmission in our community.  Due to his co-morbid illnesses, this patient is at least at moderate risk for complications without adequate follow up.  This format is felt to be most appropriate for this patient at this time.  The patient did not have access to video technology/had technical difficulties with video requiring transitioning to audio format only (telephone).  All issues noted in this document were discussed and addressed.  No physical exam could be performed with this format.  Please refer to the patient's chart for his  consent to telehealth for [facility].    Date:  02/04/2020   ID:  [name], DOB 1951/02/15, MRN [id number] The patient was identified using 2 identifiers.  Patient Location: Home Provider Location: Home Office  PCP:  Patient, No Pcp Per  Cardiologist:  Carlyle Dolly, MD    Electrophysiologist:  None   Evaluation Performed:  Follow-Up Visit  Chief Complaint: Chronic systolic heart failure, hypokalemia  History of Present Illness:    Frank Hoover is a 69 y.o. male with history of chronic systolic heart failure, hypokalemia ,AICD followed by EP, CKD stage IV, urinary retention, COPD, HTN, GERD, HLD, DM 2.  Last encounter with Dr. Harl Bowie 08/12/2018 via telemedicine.  Previous echocardiogram January 2018 was EF 30 to 35%.  Home weight was stable around 240 pounds.  Had no recent edema, shortness of breath, or dyspnea on exertion.  His fluid overload and renal failure were much improved since diagnosed with urinary obstruction and placement of a Foley catheter.  His creatinine had improved to 1.3.  If renal function continued to improve would consider alterations in his CHF regimen.  Had also deferred cardiac  catheterization due to renal failure in setting of his drop in LVEF.  He had been on Toprol 100 mg p.o. twice daily.  The last med list reported Lopressor 100 mg p.o. twice daily.  Plan was to call clarify with pharmacy due to patient needing to be on Toprol for low EF.  Basic metabolic panel and magnesium were ordered.  Spoke with the patient by phone at [facility] today.  Also spoke to [place] who is the nurse taking care of the patient.  Patient denies any recent anginal or exertional symptoms, palpitations or arrhythmias.  Denies any shortness of breath or dyspnea on exertion.  He is not very active on a daily basis.  Denies any weight gain.  Recent weight was 180 today. Recent lab work on January 20, 2020 showed hemoglobin of 10.8, hematocrit of 31.1.  Creatinine was 1.93 with a GFR of 40.3.  He has chronic kidney disease and sees Dr. Theador Hawthorne for renal disease.  He denies any PND, orthopnea or lower extremity edema.  The patient does not have symptoms concerning for COVID-19 infection (fever, chills, cough, or new shortness of breath).    Past Medical History:  Diagnosis Date  . Adenomatous colon polyp 10/30/09   Serrated adenoma removed during Colonoscopy   . AICD (automatic cardioverter/defibrillator) present 06/11/2016  . Arthritis    "legs" (09/27/2015)  . Atrial flutter with rapid ventricular response ([place]) 09/27/2015  . CHF (congestive heart failure) ([place])   . CKD (chronic kidney disease) stage 3, GFR 30-59 ml/min (HCC)   . COPD (chronic obstructive pulmonary disease) ([place])   .  Diverticulosis of colon   . Dysrhythmia   . Essential hypertension   . Gastroparesis   . GERD (gastroesophageal reflux disease)   . Gout   . Hyperlipemia   . Hypertension   . OA (osteoarthritis)   . Type 2 diabetes mellitus ([place])    Past Surgical History:  Procedure Laterality Date  . CARDIOVERSION N/A 08/22/2015   Procedure: CARDIOVERSION;  Surgeon: Larey Dresser, MD;  Location: [place];   Service: Cardiovascular;  Laterality: N/A;  . CARDIOVERSION N/A 09/01/2015   Procedure: CARDIOVERSION;  Surgeon: Lelon Perla, MD;  Location: [place];  Service: Cardiovascular;  Laterality: N/A;  . CATARACT EXTRACTION W/ INTRAOCULAR LENS  IMPLANT, BILATERAL Bilateral   . COLONOSCOPY  10/30/09   BJS:[id number]-[id number] diverticulum/serrated adenoma from ICV, next colonoscopy due 10/2012  . COLONOSCOPY N/A 09/18/2012   YWV:PXTGGYI mucosa that was seen appeared normal, however most of it was not seen due to be very poor prep  . COLONOSCOPY N/A 04/08/2013   RSW:NIOEVOJ polyp removed/inadequate preparation  . COLONOSCOPY N/A 06/22/2014   Procedure: COLONOSCOPY;  Surgeon: Daneil Dolin, MD;  [facility];  Service: Endoscopy;  Laterality: N/A;  115   . ELECTROPHYSIOLOGIC STUDY N/A 09/27/2015   Procedure: SVT Ablation;  Surgeon: Evans Lance, MD;  Location: [place] CV LAB;  Service: Cardiovascular;  Laterality: N/A;  . EP IMPLANTABLE DEVICE  06/11/2016  . ESOPHAGOGASTRODUODENOSCOPY  10/30/09   JKK:XFGHWE   . FLEXIBLE SIGMOIDOSCOPY N/A 08/25/2015   Procedure: FLEXIBLE SIGMOIDOSCOPY;  Surgeon: Jerene Bears, MD;  Location: [facility] ENDOSCOPY;  Service: Endoscopy;  Laterality: N/A;  . ICD IMPLANT N/A 06/11/2016   Procedure: ICD Implant;  Surgeon: Evans Lance, MD;  Location: [place] CV LAB;  Service: Cardiovascular;  Laterality: N/A;  . TEE WITHOUT CARDIOVERSION N/A 08/22/2015   Procedure: TRANSESOPHAGEAL ECHOCARDIOGRAM (TEE);  Surgeon: Larey Dresser, MD;  Location: [place];  Service: Cardiovascular;  Laterality: N/A;  . TEE WITHOUT CARDIOVERSION  09/27/2015   Procedure: Transesophageal Echocardiogram (Tee);  Surgeon: Evans Lance, MD;  Location: [place] CV LAB;  Service: Cardiovascular;;  . TONSILLECTOMY  1950s/1960s     Current Meds  Medication Sig  . allopurinol (ZYLOPRIM) 100 MG tablet Take 2 tablets (200 mg total) by mouth daily.  Marland Kitchen aspirin 81 MG chewable tablet Chew 81 mg  by mouth every morning.   Marland Kitchen atorvastatin (LIPITOR) 20 MG tablet Take 20 mg by mouth at bedtime.  . ferrous sulfate 325 (65 FE) MG tablet Take 325 mg by mouth 2 (two) times daily with a meal.  . gabapentin (NEURONTIN) 400 MG capsule Take 400 mg by mouth daily.   . hydrALAZINE (APRESOLINE) 100 MG tablet Take 1 tablet (100 mg total) by mouth 3 (three) times daily.  Marland Kitchen HYDROcodone-acetaminophen (NORCO/VICODIN) 5-325 MG tablet Take 1 tablet by mouth every 6 (six) hours as needed.  . insulin glargine (LANTUS) 100 UNIT/ML injection Inject 0.25 mLs (25 Units total) into the skin daily.  . isosorbide mononitrate (IMDUR) 30 MG 24 hr tablet TAKE 1 TABLET DAILY  . linaclotide (LINZESS) 145 MCG CAPS capsule Take 1 capsule (145 mcg total) by mouth daily before breakfast.  . loperamide (IMODIUM) 2 MG capsule Take by mouth as needed for diarrhea or loose stools.  Marland Kitchen loratadine (CLARITIN) 10 MG tablet Take 10 mg by mouth daily.  . metoCLOPramide (REGLAN) 5 MG tablet Take 5 mg by mouth 3 (three) times daily before meals.  . metoprolol tartrate (LOPRESSOR) 50 MG  tablet Take 50 mg by mouth 2 (two) times daily.  . pantoprazole (PROTONIX) 40 MG tablet Take 1 tablet (40 mg total) by mouth daily.  . potassium chloride SA (KLOR-CON) 20 MEQ tablet Take 20 mEq by mouth daily.   Marland Kitchen torsemide (DEMADEX) 20 MG tablet Take 20 mg by mouth daily.     Allergies:   Peanut butter flavor   Social History   Tobacco Use  . Smoking status: Former Smoker    Packs/day: 1.00    Years: 4.00    Pack years: 4.00    Types: Cigarettes    Quit date: 08/26/2010    Years since quitting: 9.4  . Smokeless tobacco: Never Used  Vaping Use  . Vaping Use: Never used  Substance Use Topics  . Alcohol use: No    Alcohol/week: 0.0 standard drinks    Comment: "stopped in 2012 when I quit smoking"  . Drug use: No     Family Hx: The patient's family history includes COPD in his father; Diabetes in his mother; Hypertension in his mother. There  is no history of Colon cancer.  ROS:   Please see the history of present illness.    All other systems reviewed and are negative.   Prior CV studies:   The following studies were reviewed today:  Jan 2018 echo Study Conclusions  - Left ventricle: The cavity size was normal. Systolic function was moderately to severely reduced. The estimated ejection fraction was in the range of 30% to 35%. Diffuse hypokinesis. Doppler parameters are consistent with abnormal left ventricular relaxation (grade 1 diastolic dysfunction). Mild to moderate LVH. - Mitral valve: There was mild regurgitation. - Left atrium: The atrium was mildly dilated. - Right ventricle: Systolic function was mildly to moderately reduced. - Atrial septum: No defect or patent foramen ovale was identified.  Labs/Other Tests and Data Reviewed:    EKG: No EKG.  This was a telemedicine visit.  Recent Labs: No results found for requested labs within last 8760 hours.   Recent Lipid Panel Lab Results  Component Value Date/Time   CHOL 79 08/15/2015 04:22 AM   TRIG 38 08/15/2015 04:22 AM   HDL 34 (L) 08/15/2015 04:22 AM   CHOLHDL 2.3 08/15/2015 04:22 AM   LDLCALC 37 08/15/2015 04:22 AM    Wt Readings from Last 3 Encounters:  02/04/20 180 lb (81.6 kg)  12/10/19 179 lb (81.2 kg)  08/18/19 179 lb (81.2 kg)     Risk Assessment/Calculations:      Objective:    Vital Signs:  BP (!) 113/58   Pulse 60   Resp 19   Ht 5' 6"  (1.676 m)   Wt 180 lb (81.6 kg)   SpO2 95%   BMI 29.05 kg/m    VITAL SIGNS:  reviewed   Patient had a normal speech pattern today.  No evidence of dyspnea, cough, or wheezing noted during phone conversation.  ASSESSMENT & PLAN:     1. Chronic systolic CHF (congestive heart failure) (HCC) Symptoms are stable.  Weight is 180 today.  Patient denies any shortness of breath, weight gain, or lower extremity edema.  Metoprolol 100 mg p.o. twice daily needs to be discontinued.  Start  Toprol-XL 100 mg p.o. twice daily.  Continue torsemide 20 mg daily.  2. Hypokalemia Last lab work on 01/20/2020 showed potassium of 3.2.  Continue potassium chloride 20 mEq daily.  Shared Decision Making/Informed Consent        COVID-19 Education: The signs and symptoms of  COVID-19 were discussed with the patient and how to seek care for testing (follow up with PCP or arrange E-visit).  The importance of social distancing was discussed today.  Time:   Today, I have spent 10 minutes with the patient with telehealth technology discussing the above problems.     Medication Adjustments/Labs and Tests Ordered: Current medicines are reviewed at length with the patient today.  Concerns regarding medicines are outlined above.   Tests Ordered: No orders of the defined types were placed in this encounter.   Medication Changes: No orders of the defined types were placed in this encounter.   Follow Up:  Virtual Visit  in 6 month(s)  Signed, Verta Ellen, NP  02/04/2020 11:16 AM    [place]

## 2020-02-03 NOTE — Telephone Encounter (Signed)
  Patient Consent for Virtual Visit         Frank Hoover has provided verbal consent on 02/03/2020 for a virtual visit (video or telephone).   CONSENT FOR VIRTUAL VISIT FOR:  Frank Hoover  By participating in this virtual visit I agree to the following:  I hereby voluntarily request, consent and authorize [facility] and its employed or contracted physicians, physician assistants, nurse practitioners or other licensed health care professionals (the Practitioner), to provide me with telemedicine health care services (the "Services") as deemed necessary by the treating Practitioner. I acknowledge and consent to receive the Services by the Practitioner via telemedicine. I understand that the telemedicine visit will involve communicating with the Practitioner through live audiovisual communication technology and the disclosure of certain medical information by electronic transmission. I acknowledge that I have been given the opportunity to request an in-person assessment or other available alternative prior to the telemedicine visit and am voluntarily participating in the telemedicine visit.  I understand that I have the right to withhold or withdraw my consent to the use of telemedicine in the course of my care at any time, without affecting my right to future care or treatment, and that the Practitioner or I may terminate the telemedicine visit at any time. I understand that I have the right to inspect all information obtained and/or recorded in the course of the telemedicine visit and may receive copies of available information for a reasonable fee.  I understand that some of the potential risks of receiving the Services via telemedicine include:  Marland Kitchen Delay or interruption in medical evaluation due to technological equipment failure or disruption; . Information transmitted may not be sufficient (e.g. poor resolution of images) to allow for appropriate medical decision making by the  Practitioner; and/or  . In rare instances, security protocols could fail, causing a breach of personal health information.  Furthermore, I acknowledge that it is my responsibility to provide information about my medical history, conditions and care that is complete and accurate to the best of my ability. I acknowledge that Practitioner's advice, recommendations, and/or decision may be based on factors not within their control, such as incomplete or inaccurate data provided by me or distortions of diagnostic images or specimens that may result from electronic transmissions. I understand that the practice of medicine is not an exact science and that Practitioner makes no warranties or guarantees regarding treatment outcomes. I acknowledge that a copy of this consent can be made available to me via my patient portal ([facility]), or I can request a printed copy by calling the office of [facility].    I understand that my insurance will be billed for this visit.   I have read or had this consent read to me. . I understand the contents of this consent, which adequately explains the benefits and risks of the Services being provided via telemedicine.  . I have been provided ample opportunity to ask questions regarding this consent and the Services and have had my questions answered to my satisfaction. . I give my informed consent for the services to be provided through the use of telemedicine in my medical care

## 2020-02-04 ENCOUNTER — Telehealth (INDEPENDENT_AMBULATORY_CARE_PROVIDER_SITE_OTHER): Payer: Medicare Other | Admitting: Family Medicine

## 2020-02-04 ENCOUNTER — Encounter: Payer: Self-pay | Admitting: Family Medicine

## 2020-02-04 VITALS — BP 113/58 | HR 60 | Resp 19 | Ht 66.0 in | Wt 180.0 lb

## 2020-02-04 DIAGNOSIS — E876 Hypokalemia: Secondary | ICD-10-CM | POA: Diagnosis not present

## 2020-02-04 DIAGNOSIS — I5022 Chronic systolic (congestive) heart failure: Secondary | ICD-10-CM | POA: Diagnosis not present

## 2020-02-04 NOTE — Patient Instructions (Signed)
Your physician wants you to follow-up in: [place] will receive a reminder letter in the mail two months in advance. If you don't receive a letter, please call our office to schedule the follow-up appointment.  Your physician recommends that you continue on your current medications as directed. Please refer to the Current Medication list given to you today.  TAKE TOPROL XL 100 MG TWICE DAILY   Thank you for choosing [facility]!!

## 2020-03-01 ENCOUNTER — Ambulatory Visit (INDEPENDENT_AMBULATORY_CARE_PROVIDER_SITE_OTHER): Payer: Medicare Other | Admitting: Internal Medicine

## 2020-03-01 ENCOUNTER — Other Ambulatory Visit: Payer: Self-pay

## 2020-03-01 ENCOUNTER — Encounter: Payer: Self-pay | Admitting: Internal Medicine

## 2020-03-01 VITALS — BP 110/64 | HR 87 | Ht 66.0 in | Wt 170.0 lb

## 2020-03-01 DIAGNOSIS — I5022 Chronic systolic (congestive) heart failure: Secondary | ICD-10-CM | POA: Diagnosis not present

## 2020-03-01 DIAGNOSIS — Z95810 Presence of automatic (implantable) cardiac defibrillator: Secondary | ICD-10-CM

## 2020-03-01 DIAGNOSIS — I428 Other cardiomyopathies: Secondary | ICD-10-CM | POA: Diagnosis not present

## 2020-03-01 DIAGNOSIS — I4891 Unspecified atrial fibrillation: Secondary | ICD-10-CM | POA: Diagnosis not present

## 2020-03-01 LAB — CUP PACEART INCLINIC DEVICE CHECK
Date Time Interrogation Session: 20211229125559
HighPow Impedance: 54 Ohm
HighPow Impedance: 78 Ohm
Implantable Lead Implant Date: 20180410
Implantable Lead Implant Date: 20180410
Implantable Lead Location: 753859
Implantable Lead Location: 753860
Implantable Lead Model: 292
Implantable Lead Model: 7740
Implantable Lead Serial Number: 420134
Implantable Lead Serial Number: 693412
Implantable Pulse Generator Implant Date: 20180410
Lead Channel Impedance Value: 483 Ohm
Lead Channel Impedance Value: 702 Ohm
Lead Channel Pacing Threshold Amplitude: 0.6 V
Lead Channel Pacing Threshold Amplitude: 0.9 V
Lead Channel Pacing Threshold Pulse Width: 0.4 ms
Lead Channel Pacing Threshold Pulse Width: 0.4 ms
Lead Channel Sensing Intrinsic Amplitude: 24.9 mV
Lead Channel Sensing Intrinsic Amplitude: 9.3 mV
Lead Channel Setting Pacing Amplitude: 2 V
Lead Channel Setting Pacing Amplitude: 2.4 V
Lead Channel Setting Pacing Pulse Width: 0.4 ms
Lead Channel Setting Sensing Sensitivity: 0.5 mV
Pulse Gen Serial Number: 533916

## 2020-03-01 NOTE — Patient Instructions (Signed)
Medication Instructions:  Your physician recommends that you continue on your current medications as directed. Please refer to the Current Medication list given to you today.  *If you need a refill on your cardiac medications before your next appointment, please call your pharmacy*   Lab Work: NONE   If you have labs (blood work) drawn today and your tests are completely normal, you will receive your results only by: . MyChart Message (if you have MyChart) OR . A paper copy in the mail If you have any lab test that is abnormal or we need to change your treatment, we will call you to review the results.   Testing/Procedures: NONE    Follow-Up: At [facility], you and your health needs are our priority.  As part of our continuing mission to provide you with exceptional heart care, we have created designated Provider Care Teams.  These Care Teams include your primary Cardiologist (physician) and Advanced Practice Providers (APPs -  Physician Assistants and Nurse Practitioners) who all work together to provide you with the care you need, when you need it.  We recommend signing up for the patient portal called "MyChart".  Sign up information is provided on this After Visit Summary.  MyChart is used to connect with patients for Virtual Visits (Telemedicine).  Patients are able to view lab/test results, encounter notes, upcoming appointments, etc.  Non-urgent messages can be sent to your provider as well.   To learn more about what you can do with MyChart, go to [email or web address].    Your next appointment:   1 year(s)  The format for your next appointment:   In Person  Provider:   Gregg Taylor, MD   Other Instructions Thank you for choosing [facility]!    

## 2020-03-01 NOTE — Progress Notes (Signed)
HPI Frank Hoover returns today for followup. He is a pleasant 69 yo man with multiple medical problems who has had both atrial flutter and SVT (AVNRT). He underwent EP study and catheter ablation of both arrhythmias several months ago and then ICD insertion due to severe LV dysfunction. In the interim, he has developed some atrial fib. He has Lv dysfunction which improved some after his ablation. He has been compliant on good medical therapy. No ICD shocks. He is now living in a SNF Allergies  Allergen Reactions  . Peanut Butter Flavor      Current Outpatient Medications  Medication Sig Dispense Refill  . acetaminophen (TYLENOL) 325 MG tablet Take 2 tablets (650 mg total) by mouth every 6 (six) hours as needed for mild pain (or Fever >/= 101). 30 tablet 5  . acetic acid 0.25 % irrigation Irrigate with as directed daily. 500 mL 12  . albuterol (PROAIR HFA) 108 (90 Base) MCG/ACT inhaler Inhale 2 puffs into the lungs every 6 (six) hours as needed for wheezing or shortness of breath.     Marland Kitchen albuterol (VENTOLIN HFA) 108 (90 Base) MCG/ACT inhaler Inhale into the lungs every 6 (six) hours as needed for wheezing or shortness of breath.    . allopurinol (ZYLOPRIM) 100 MG tablet Take 2 tablets (200 mg total) by mouth daily.    Marland Kitchen alum & mag hydroxide-simeth (MAALOX/MYLANTA) 200-200-20 MG/5ML suspension Take 15 mLs by mouth every 6 (six) hours as needed for indigestion or heartburn.     Marland Kitchen aspirin 81 MG chewable tablet Chew 81 mg by mouth every morning.     Marland Kitchen atorvastatin (LIPITOR) 20 MG tablet Take 20 mg by mouth at bedtime.    . cholecalciferol (VITAMIN D) 1000 units tablet Take 1,000 Units by mouth daily.    Marland Kitchen dextrose (GLUTOSE) 40 % GEL Take 1 Tube by mouth.     . docusate sodium (COLACE) 100 MG capsule Take 2 capsules (200 mg total) by mouth at bedtime. 10 capsule 0  . ferrous sulfate 325 (65 FE) MG tablet Take 325 mg by mouth 2 (two) times daily with a meal.    . fluticasone (FLONASE) 50  MCG/ACT nasal spray Place 2 sprays into both nostrils daily.     Marland Kitchen gabapentin (NEURONTIN) 400 MG capsule Take 400 mg by mouth daily.     Marland Kitchen HUMULIN 70/30 KWIKPEN (70-30) 100 UNIT/ML KwikPen     . hydrALAZINE (APRESOLINE) 100 MG tablet Take 1 tablet (100 mg total) by mouth 3 (three) times daily. 90 tablet 3  . HYDROcodone-acetaminophen (NORCO/VICODIN) 5-325 MG tablet Take 1 tablet by mouth every 6 (six) hours as needed.    . insulin aspart (NOVOLOG) 100 UNIT/ML injection Inject 0-20 Units into the skin 3 (three) times daily with meals. 10 mL 11  . insulin aspart (NOVOLOG) 100 UNIT/ML injection Inject 0-5 Units into the skin at bedtime. 10 mL 11  . insulin glargine (LANTUS) 100 UNIT/ML injection Inject 0.25 mLs (25 Units total) into the skin daily. 10 mL 11  . isosorbide mononitrate (IMDUR) 30 MG 24 hr tablet TAKE 1 TABLET DAILY 90 tablet 1  . linaclotide (LINZESS) 145 MCG CAPS capsule Take 1 capsule (145 mcg total) by mouth daily before breakfast. 30 capsule 5  . loperamide (IMODIUM) 2 MG capsule Take by mouth as needed for diarrhea or loose stools.    Marland Kitchen loratadine (CLARITIN) 10 MG tablet Take 10 mg by mouth daily.    . magnesium hydroxide (  MILK OF MAGNESIA) 400 MG/5ML suspension Take by mouth daily as needed for mild constipation.    . metoCLOPramide (REGLAN) 5 MG tablet Take 5 mg by mouth 3 (three) times daily before meals.    . metoprolol tartrate (LOPRESSOR) 50 MG tablet Take by mouth.    . Multiple Vitamin (MULTIVITAMIN WITH MINERALS) TABS tablet Take 1 tablet by mouth daily.    . pantoprazole (PROTONIX) 40 MG tablet Take 1 tablet (40 mg total) by mouth daily.    . potassium chloride SA (KLOR-CON) 20 MEQ tablet Take 20 mEq by mouth daily.     . sertraline (ZOLOFT) 25 MG tablet Take 25 mg by mouth daily.    Marland Kitchen torsemide (DEMADEX) 20 MG tablet Take 20 mg by mouth daily.    . vitamin C (ASCORBIC ACID) 500 MG tablet Take 500 mg by mouth 2 (two) times daily.     . ondansetron (ZOFRAN) 4 MG tablet  Take 1 tablet (4 mg total) by mouth every 8 (eight) hours as needed for nausea or vomiting. (Patient not taking: Reported on 03/01/2020) 30 tablet 1  . tamsulosin (FLOMAX) 0.4 MG CAPS capsule Take 1 capsule by mouth daily.  (Patient not taking: Reported on 03/01/2020)     No current facility-administered medications for this visit.     Past Medical History:  Diagnosis Date  . Adenomatous colon polyp 10/30/09   Serrated adenoma removed during Colonoscopy   . AICD (automatic cardioverter/defibrillator) present 06/11/2016  . Arthritis    "legs" (09/27/2015)  . Atrial flutter with rapid ventricular response ([place]) 09/27/2015  . CHF (congestive heart failure) ([place])   . CKD (chronic kidney disease) stage 3, GFR 30-59 ml/min (HCC)   . COPD (chronic obstructive pulmonary disease) ([place])   . Diverticulosis of colon   . Dysrhythmia   . Essential hypertension   . Gastroparesis   . GERD (gastroesophageal reflux disease)   . Gout   . Hyperlipemia   . Hypertension   . OA (osteoarthritis)   . Type 2 diabetes mellitus (HCC)     ROS:   All systems reviewed and negative except as noted in the HPI.   Past Surgical History:  Procedure Laterality Date  . CARDIOVERSION N/A 08/22/2015   Procedure: CARDIOVERSION;  Surgeon: Larey Dresser, MD;  Location: [facility];  Service: Cardiovascular;  Laterality: N/A;  . CARDIOVERSION N/A 09/01/2015   Procedure: CARDIOVERSION;  Surgeon: Lelon Perla, MD;  Location: [facility];  Service: Cardiovascular;  Laterality: N/A;  . CATARACT EXTRACTION W/ INTRAOCULAR LENS  IMPLANT, BILATERAL Bilateral   . COLONOSCOPY  10/30/09   EUM:PNTI-RWERX diverticulum/serrated adenoma from ICV, next colonoscopy due 10/2012  . COLONOSCOPY N/A 09/18/2012   VQM:GQQPYPP mucosa that was seen appeared normal, however most of it was not seen due to be very poor prep  . COLONOSCOPY N/A 04/08/2013   JKD:TOIZTIW polyp removed/inadequate preparation  . COLONOSCOPY N/A 06/22/2014    Procedure: COLONOSCOPY;  Surgeon: Daneil Dolin, MD;  [facility];  Service: Endoscopy;  Laterality: N/A;  115   . ELECTROPHYSIOLOGIC STUDY N/A 09/27/2015   Procedure: SVT Ablation;  Surgeon: Evans Lance, MD;  Location: [facility];  Service: Cardiovascular;  Laterality: N/A;  . EP IMPLANTABLE DEVICE  06/11/2016  . ESOPHAGOGASTRODUODENOSCOPY  10/30/09   PYK:DXIPJA   . FLEXIBLE SIGMOIDOSCOPY N/A 08/25/2015   Procedure: FLEXIBLE SIGMOIDOSCOPY;  Surgeon: Jerene Bears, MD;  [facility];  Service: Endoscopy;  Laterality: N/A;  . ICD IMPLANT N/A  06/11/2016   Procedure: ICD Implant;  Surgeon: Evans Lance, MD;  Location: [facility];  Service: Cardiovascular;  Laterality: N/A;  . TEE WITHOUT CARDIOVERSION N/A 08/22/2015   Procedure: TRANSESOPHAGEAL ECHOCARDIOGRAM (TEE);  Surgeon: Larey Dresser, MD;  Location: [facility];  Service: Cardiovascular;  Laterality: N/A;  . TEE WITHOUT CARDIOVERSION  09/27/2015   Procedure: Transesophageal Echocardiogram (Tee);  Surgeon: Evans Lance, MD;  Location: [place];  Service: Cardiovascular;;  . TONSILLECTOMY  1950s/1960s     Family History  Problem Relation Age of Onset  . COPD Father   . Diabetes Mother   . Hypertension Mother   . Colon cancer Neg Hx      Social History   Socioeconomic History  . Marital status: Married    Spouse name: Not on file  . Number of children: 2  . Years of education: Not on file  . Highest education level: Not on file  Occupational History  . Occupation: retired  Tobacco Use  . Smoking status: Former Smoker    Packs/day: 1.00    Years: 4.00    Pack years: 4.00    Types: Cigarettes    Quit date: 08/26/2010    Years since quitting: 9.5  . Smokeless tobacco: Never Used  Vaping Use  . Vaping Use: Never used  Substance and Sexual Activity  . Alcohol use: No    Alcohol/week: 0.0 standard drinks    Comment: "stopped in 2012 when I quit smoking"  . Drug use: No   . Sexual activity: Not Currently  Other Topics Concern  . Not on file  Social History Narrative  . Not on file   Social Determinants of Health   Financial Resource Strain: Not on file  Food Insecurity: Not on file  Transportation Needs: Not on file  Physical Activity: Not on file  Stress: Not on file  Social Connections: Not on file  Intimate Partner Violence: Not on file     BP 110/64   Pulse 87   Ht 5' 6"  (1.676 m)   Wt 170 lb (77.1 kg)   SpO2 99%   BMI 27.44 kg/m   Physical Exam:  Chronically ill appearing NAD HEENT: Unremarkable Neck:  6 cm JVD, no thyromegally Lymphatics:  No adenopathy Back:  No CVA tenderness Lungs:  Clear with no wheezes HEART:  Regular rate rhythm, no murmurs, no rubs, no clicks Abd:  soft, positive bowel sounds, no organomegally, no rebound, no guarding Ext:  2 plus pulses, no edema, no cyanosis, no clubbing Skin:  No rashes no nodules Neuro:  CN II through XII intact, motor grossly intact  EKG - nsr with noise artifact  DEVICE  Normal device function.  See [facility] for details.   Assess/Plan: 1. ICD - his [place] ICD is working normally. We will recheck in several months.  2. Atrial fib - he is mostly in and rarely out of rhythm. We will follow.  3. Chronic systolic heart failure - his symptoms are class 2. Continue his current meds.  Frank Overlie Cainen Burnham,MD

## 2020-03-09 ENCOUNTER — Ambulatory Visit (INDEPENDENT_AMBULATORY_CARE_PROVIDER_SITE_OTHER): Payer: Medicare Other | Admitting: Urology

## 2020-03-09 ENCOUNTER — Other Ambulatory Visit: Payer: Self-pay

## 2020-03-09 ENCOUNTER — Encounter: Payer: Self-pay | Admitting: Urology

## 2020-03-09 VITALS — BP 89/63 | HR 87 | Temp 97.9°F | Ht 66.0 in | Wt 170.0 lb

## 2020-03-09 DIAGNOSIS — R310 Gross hematuria: Secondary | ICD-10-CM

## 2020-03-09 DIAGNOSIS — R339 Retention of urine, unspecified: Secondary | ICD-10-CM

## 2020-03-09 NOTE — Progress Notes (Signed)
Urological Symptom Review  Patient is experiencing the following symptoms: Pt has cath   Review of Systems  Gastrointestinal (upper)  : Negative for upper GI symptoms  Gastrointestinal (lower) : Negative for lower GI symptoms  Constitutional : Weight loss  Skin: Negative for skin symptoms  Eyes: Negative for eye symptoms  Ear/Nose/Throat : Negative for Ear/Nose/Throat symptoms  Hematologic/Lymphatic: Negative for Hematologic/Lymphatic symptoms  Cardiovascular : Negative for cardiovascular symptoms  Respiratory : Negative for respiratory symptoms  Endocrine: Negative for endocrine symptoms  Musculoskeletal: Negative for musculoskeletal symptoms  Neurological: Negative for neurological symptoms  Psychologic: Negative for psychiatric symptoms

## 2020-03-14 NOTE — Progress Notes (Signed)
03/09/2020 9:35 AM   Frank Hoover 28-Mar-1950 [id number]  Referring provider: No referring provider defined for this encounter.  followup gross hematuria  HPI: Frank Hoover is a 70yo here for followup for gross hematuria and urinary retention. NO gross hematuria since last visit. His urinary retention is managed with an indwelling foley. His facility is performing acetic acid flushes daily which is helping decrease sediment. NO pelvic pain. NO other complaints today   PMH: Past Medical History:  Diagnosis Date  . Adenomatous colon polyp 10/30/09   Serrated adenoma removed during Colonoscopy   . AICD (automatic cardioverter/defibrillator) present 06/11/2016  . Arthritis    "legs" (09/27/2015)  . Atrial flutter with rapid ventricular response ([place]) 09/27/2015  . CHF (congestive heart failure) ([place])   . CKD (chronic kidney disease) stage 3, GFR 30-59 ml/min (HCC)   . COPD (chronic obstructive pulmonary disease) ([place])   . Diverticulosis of colon   . Dysrhythmia   . Essential hypertension   . Gastroparesis   . GERD (gastroesophageal reflux disease)   . Gout   . Hyperlipemia   . Hypertension   . OA (osteoarthritis)   . Type 2 diabetes mellitus Frank Hoover)     Surgical History: Past Surgical History:  Procedure Laterality Date  . CARDIOVERSION N/A 08/22/2015   Procedure: CARDIOVERSION;  Surgeon: Larey Dresser, MD;  Location: [place];  Service: Cardiovascular;  Laterality: N/A;  . CARDIOVERSION N/A 09/01/2015   Procedure: CARDIOVERSION;  Surgeon: Lelon Perla, MD;  Location: [place];  Service: Cardiovascular;  Laterality: N/A;  . CATARACT EXTRACTION W/ INTRAOCULAR LENS  IMPLANT, BILATERAL Bilateral   . COLONOSCOPY  10/30/09   AUQ:[id number] diverticulum/serrated adenoma from ICV, next colonoscopy due 10/2012  . COLONOSCOPY N/A 09/18/2012   WLS:LHTDSKA mucosa that was seen appeared normal, however most of it was not seen due to be very poor prep  . COLONOSCOPY N/A  04/08/2013   JGO:TLXBWIO polyp removed/inadequate preparation  . COLONOSCOPY N/A 06/22/2014   Procedure: COLONOSCOPY;  Surgeon: Daneil Dolin, MD;  [facility];  Service: Endoscopy;  Laterality: N/A;  115   . ELECTROPHYSIOLOGIC STUDY N/A 09/27/2015   Procedure: SVT Ablation;  Surgeon: Evans Lance, MD;  Location: [place] [facility];  Service: Cardiovascular;  Laterality: N/A;  . EP IMPLANTABLE DEVICE  06/11/2016  . ESOPHAGOGASTRODUODENOSCOPY  10/30/09   MBT:DHRCBU   . FLEXIBLE SIGMOIDOSCOPY N/A 08/25/2015   Procedure: FLEXIBLE SIGMOIDOSCOPY;  Surgeon: Jerene Bears, MD;  Location: [facility];  Service: Endoscopy;  Laterality: N/A;  . ICD IMPLANT N/A 06/11/2016   Procedure: ICD Implant;  Surgeon: Evans Lance, MD;  Location: [place] [facility];  Service: Cardiovascular;  Laterality: N/A;  . TEE WITHOUT CARDIOVERSION N/A 08/22/2015   Procedure: TRANSESOPHAGEAL ECHOCARDIOGRAM (TEE);  Surgeon: Larey Dresser, MD;  Location: [place];  Service: Cardiovascular;  Laterality: N/A;  . TEE WITHOUT CARDIOVERSION  09/27/2015   Procedure: Transesophageal Echocardiogram (Tee);  Surgeon: Evans Lance, MD;  Location: [place] [facility];  Service: Cardiovascular;;  . TONSILLECTOMY  1950s/1960s    Home Medications:  Allergies as of 03/09/2020      Reactions   Peanut Butter Flavor       Medication List       Accurate as of March 09, 2020 11:59 PM. If you have any questions, ask your nurse or doctor.        STOP taking these medications   acetic acid 0.25 % irrigation Stopped by: Saralyn Pilar  Frank Hoos, MD   alum & mag hydroxide-simeth 200-200-20 MG/5ML suspension Commonly known as: MAALOX/MYLANTA Stopped by: Nicolette Bang, MD   dextrose 40 % Gel Commonly known as: GLUTOSE Stopped by: Nicolette Bang, MD   fluticasone 50 MCG/ACT nasal spray Commonly known as: FLONASE Stopped by: Nicolette Bang, MD   ondansetron 4 MG tablet Commonly known as: ZOFRAN Stopped by: Nicolette Bang, MD   tamsulosin 0.4 MG Caps capsule Commonly known as: FLOMAX Stopped by: Nicolette Bang, MD     TAKE these medications   acetaminophen 325 MG tablet Commonly known as: TYLENOL Take 2 tablets (650 mg total) by mouth every 6 (six) hours as needed for mild pain (or Fever >/= 101).   albuterol 108 (90 Base) MCG/ACT inhaler Commonly known as: VENTOLIN HFA Inhale into the lungs every 6 (six) hours as needed for wheezing or shortness of breath. What changed: Another medication with the same name was removed. Continue taking this medication, and follow the directions you see here. Changed by: Nicolette Bang, MD   allopurinol 100 MG tablet Commonly known as: ZYLOPRIM Take 2 tablets (200 mg total) by mouth daily.   aspirin 81 MG chewable tablet Chew 81 mg by mouth every morning.   atorvastatin 20 MG tablet Commonly known as: LIPITOR Take 20 mg by mouth at bedtime.   cholecalciferol 1000 units tablet Commonly known as: VITAMIN D Take 1,000 Units by mouth daily.   docusate sodium 100 MG capsule Commonly known as: COLACE Take 2 capsules (200 mg total) by mouth at bedtime.   ferrous sulfate 325 (65 FE) MG tablet Take 325 mg by mouth 2 (two) times daily with a meal.   gabapentin 400 MG capsule Commonly known as: NEURONTIN Take 400 mg by mouth daily.   HumuLIN 70/30 KwikPen (70-30) 100 UNIT/ML KwikPen Generic drug: insulin isophane & regular human   hydrALAZINE 100 MG tablet Commonly known as: APRESOLINE Take 1 tablet (100 mg total) by mouth 3 (three) times daily.   HYDROcodone-acetaminophen 5-325 MG tablet Commonly known as: NORCO/VICODIN Take 1 tablet by mouth every 6 (six) hours as needed.   insulin aspart 100 UNIT/ML injection Commonly known as: novoLOG Inject 0-20 Units into the skin 3 (three) times daily with meals.   insulin aspart 100 UNIT/ML injection Commonly known as: novoLOG Inject 0-5 Units into the skin at bedtime.   insulin glargine 100  UNIT/ML injection Commonly known as: LANTUS Inject 0.25 mLs (25 Units total) into the skin daily.   isosorbide mononitrate 30 MG 24 hr tablet Commonly known as: IMDUR TAKE 1 TABLET DAILY   linaclotide 145 MCG Caps capsule Commonly known as: LINZESS Take 1 capsule (145 mcg total) by mouth daily before breakfast.   loperamide 2 MG capsule Commonly known as: IMODIUM Take by mouth as needed for diarrhea or loose stools.   loratadine 10 MG tablet Commonly known as: CLARITIN Take 10 mg by mouth daily.   metoCLOPramide 5 MG tablet Commonly known as: REGLAN Take 5 mg by mouth 3 (three) times daily before meals.   metoprolol tartrate 50 MG tablet Commonly known as: LOPRESSOR Take by mouth.   Milk of Magnesia 400 MG/5ML suspension Generic drug: magnesium hydroxide Take by mouth daily as needed for mild constipation.   multivitamin with minerals Tabs tablet Take 1 tablet by mouth daily.   pantoprazole 40 MG tablet Commonly known as: PROTONIX Take 1 tablet (40 mg total) by mouth daily.   pantoprazole 20 MG tablet Commonly known as: PROTONIX Take 20 mg  by mouth 2 (two) times daily.   potassium chloride SA 20 MEQ tablet Commonly known as: KLOR-CON Take 20 mEq by mouth daily.   sertraline 50 MG tablet Commonly known as: ZOLOFT Take 50 mg by mouth daily. What changed: Another medication with the same name was removed. Continue taking this medication, and follow the directions you see here. Changed by: Nicolette Bang, MD   torsemide 20 MG tablet Commonly known as: DEMADEX Take 20 mg by mouth daily.   vitamin C 500 MG tablet Commonly known as: ASCORBIC ACID Take 500 mg by mouth 2 (two) times daily.       Allergies:  Allergies  Allergen Reactions  . Peanut Butter Flavor     Family History: Family History  Problem Relation Age of Onset  . COPD Father   . Diabetes Mother   . Hypertension Mother   . Colon cancer Neg Hx     Social History:  reports that he  quit smoking about 9 years ago. His smoking use included cigarettes. He has a 4.00 pack-year smoking history. He has never used smokeless tobacco. He reports that he does not drink alcohol and does not use drugs.  ROS: All other review of systems were reviewed and are negative except what is noted above in HPI  Physical Exam: BP (!) 89/63   Pulse 87   Temp 97.9 F (36.6 C)   Ht 5' 6"  (1.676 m)   Wt 170 lb (77.1 kg)   BMI 27.44 kg/m   Constitutional:  Alert and oriented, No acute distress. HEENT: [place] AT, moist mucus membranes.  Trachea midline, no masses. Cardiovascular: No clubbing, cyanosis, or edema. Respiratory: Normal respiratory effort, no increased work of breathing. GI: Abdomen is soft, nontender, nondistended, no abdominal masses GU: No CVA tenderness.  Lymph: No cervical or inguinal lymphadenopathy. Skin: No rashes, bruises or suspicious lesions. Neurologic: Grossly intact, no focal deficits, moving all 4 extremities. Psychiatric: Normal mood and affect.  Laboratory Data: Lab Results  Component Value Date   WBC 7.3 09/08/2018   HGB 12.2 (L) 09/08/2018   HCT 36.3 (L) 09/08/2018   MCV 89.9 09/08/2018   PLT 251 09/08/2018    Lab Results  Component Value Date   CREATININE 1.61 (H) 09/12/2018    No results found for: PSA  No results found for: TESTOSTERONE  Lab Results  Component Value Date   HGBA1C 7.2 (H) 07/31/2018    Urinalysis    Component Value Date/Time   COLORURINE AMBER (A) 09/07/2018 2227   APPEARANCEUR CLOUDY (A) 09/07/2018 2227   LABSPEC 1.015 09/07/2018 2227   PHURINE 5.0 09/07/2018 2227   GLUCOSEU NEGATIVE 09/07/2018 2227   HGBUR NEGATIVE 09/07/2018 2227   BILIRUBINUR NEGATIVE 09/07/2018 2227   KETONESUR NEGATIVE 09/07/2018 2227   PROTEINUR 30 (A) 09/07/2018 2227   UROBILINOGEN 0.2 12/02/2013 1739   NITRITE NEGATIVE 09/07/2018 2227   LEUKOCYTESUR LARGE (A) 09/07/2018 2227    Lab Results  Component Value Date   BACTERIA MANY (A)  09/07/2018    Pertinent Imaging:  No results found for this or any previous visit.  No results found for this or any previous visit.  No results found for this or any previous visit.  No results found for this or any previous visit.  Results for orders placed during the hospital encounter of 09/07/18  US RENAL  Narrative CLINICAL DATA:  Acute renal failure.  EXAM: RENAL / URINARY TRACT ULTRASOUND COMPLETE  COMPARISON:  None.  FINDINGS: Right Kidney:  Renal measurements: 7.9 x 4.6 x 5.3 cm = volume: 99.8 mL . Echogenicity within normal limits. No mass or hydronephrosis visualized.  Left Kidney:  Renal measurements: 8.7 x 4.7 x 5.6 cm = volume: 120.0 mL. Echogenicity within normal limits. No mass or hydronephrosis visualized.  Bladder:  Decompressed with Foley catheter.  IMPRESSION: No hydronephrosis.   Electronically Signed By: Lovey Newcomer M.D. On: 09/08/2018 14:14  No results found for this or any previous visit.  No results found for this or any previous visit.  No results found for this or any previous visit.   Assessment & Plan:    1. Gross hematuria -resolved  2. Urinary retention -Patient to have monthly foley changes at his facility -continue acetic acid 43m flushes daily -RTC 1 year   Return in about 1 year (around 03/09/2021).  PNicolette Bang MD  [facility] [place]

## 2020-03-14 NOTE — Patient Instructions (Signed)

## 2020-06-12 ENCOUNTER — Ambulatory Visit (INDEPENDENT_AMBULATORY_CARE_PROVIDER_SITE_OTHER): Payer: Medicare Other

## 2020-06-12 ENCOUNTER — Telehealth: Payer: Self-pay | Admitting: Internal Medicine

## 2020-06-12 DIAGNOSIS — I428 Other cardiomyopathies: Secondary | ICD-10-CM | POA: Diagnosis not present

## 2020-06-12 NOTE — Telephone Encounter (Signed)
Patient's monitor had been unplugged and placed in a closet  So no remote was sent. Monitor now at bedside and remote transmission received with no episodes or alerts and normal device function.

## 2020-06-12 NOTE — Telephone Encounter (Signed)
Please call Lanney Gins at the [facility] concerning the pt's remote device checks   [phone]

## 2020-06-13 LAB — CUP PACEART REMOTE DEVICE CHECK
Battery Remaining Longevity: 144 mo
Battery Remaining Percentage: 100 %
Brady Statistic RA Percent Paced: 3 %
Brady Statistic RV Percent Paced: 1 %
Date Time Interrogation Session: 20220411111400
HighPow Impedance: 78 Ohm
Implantable Lead Implant Date: 20180410
Implantable Lead Implant Date: 20180410
Implantable Lead Location: 753859
Implantable Lead Location: 753860
Implantable Lead Model: 292
Implantable Lead Model: 7740
Implantable Lead Serial Number: 420134
Implantable Lead Serial Number: 693412
Implantable Pulse Generator Implant Date: 20180410
Lead Channel Impedance Value: 404 Ohm
Lead Channel Impedance Value: 713 Ohm
Lead Channel Setting Pacing Amplitude: 2 V
Lead Channel Setting Pacing Amplitude: 2.4 V
Lead Channel Setting Pacing Pulse Width: 0.4 ms
Lead Channel Setting Sensing Sensitivity: 0.5 mV
Pulse Gen Serial Number: 533916

## 2020-06-23 ENCOUNTER — Inpatient Hospital Stay (HOSPITAL_COMMUNITY)
Admission: AD | Admit: 2020-06-23 | Discharge: 2020-06-28 | DRG: 682 | Disposition: A | Discharge status: Skilled Nursing Facility | Payer: Medicare Other | Source: Other Acute Inpatient Hospital | Attending: Internal Medicine | Admitting: Internal Medicine

## 2020-06-23 ENCOUNTER — Inpatient Hospital Stay (HOSPITAL_COMMUNITY): Payer: Medicare Other

## 2020-06-23 ENCOUNTER — Other Ambulatory Visit (HOSPITAL_COMMUNITY): Payer: Medicare Other

## 2020-06-23 DIAGNOSIS — Z79899 Other long term (current) drug therapy: Secondary | ICD-10-CM

## 2020-06-23 DIAGNOSIS — I130 Hypertensive heart and chronic kidney disease with heart failure and stage 1 through stage 4 chronic kidney disease, or unspecified chronic kidney disease: Secondary | ICD-10-CM | POA: Diagnosis not present

## 2020-06-23 DIAGNOSIS — Z20822 Contact with and (suspected) exposure to covid-19: Secondary | ICD-10-CM | POA: Diagnosis present

## 2020-06-23 DIAGNOSIS — Z9842 Cataract extraction status, left eye: Secondary | ICD-10-CM

## 2020-06-23 DIAGNOSIS — Z7982 Long term (current) use of aspirin: Secondary | ICD-10-CM | POA: Diagnosis not present

## 2020-06-23 DIAGNOSIS — R7989 Other specified abnormal findings of blood chemistry: Secondary | ICD-10-CM | POA: Diagnosis present

## 2020-06-23 DIAGNOSIS — I480 Paroxysmal atrial fibrillation: Secondary | ICD-10-CM | POA: Diagnosis not present

## 2020-06-23 DIAGNOSIS — R778 Other specified abnormalities of plasma proteins: Secondary | ICD-10-CM | POA: Diagnosis not present

## 2020-06-23 DIAGNOSIS — E871 Hypo-osmolality and hyponatremia: Secondary | ICD-10-CM | POA: Diagnosis present

## 2020-06-23 DIAGNOSIS — N179 Acute kidney failure, unspecified: Secondary | ICD-10-CM | POA: Diagnosis present

## 2020-06-23 DIAGNOSIS — I10 Essential (primary) hypertension: Secondary | ICD-10-CM | POA: Diagnosis present

## 2020-06-23 DIAGNOSIS — E872 Acidosis, unspecified: Secondary | ICD-10-CM | POA: Diagnosis present

## 2020-06-23 DIAGNOSIS — E1122 Type 2 diabetes mellitus with diabetic chronic kidney disease: Secondary | ICD-10-CM | POA: Diagnosis not present

## 2020-06-23 DIAGNOSIS — IMO0002 Reserved for concepts with insufficient information to code with codable children: Secondary | ICD-10-CM | POA: Diagnosis present

## 2020-06-23 DIAGNOSIS — E118 Type 2 diabetes mellitus with unspecified complications: Secondary | ICD-10-CM | POA: Diagnosis not present

## 2020-06-23 DIAGNOSIS — Z95810 Presence of automatic (implantable) cardiac defibrillator: Secondary | ICD-10-CM | POA: Diagnosis not present

## 2020-06-23 DIAGNOSIS — Z86010 Personal history of colonic polyps: Secondary | ICD-10-CM | POA: Diagnosis not present

## 2020-06-23 DIAGNOSIS — Z794 Long term (current) use of insulin: Secondary | ICD-10-CM

## 2020-06-23 DIAGNOSIS — N1832 Chronic kidney disease, stage 3b: Secondary | ICD-10-CM | POA: Diagnosis present

## 2020-06-23 DIAGNOSIS — E861 Hypovolemia: Secondary | ICD-10-CM | POA: Diagnosis present

## 2020-06-23 DIAGNOSIS — I21A1 Myocardial infarction type 2: Secondary | ICD-10-CM | POA: Diagnosis present

## 2020-06-23 DIAGNOSIS — Z87891 Personal history of nicotine dependence: Secondary | ICD-10-CM

## 2020-06-23 DIAGNOSIS — E860 Dehydration: Secondary | ICD-10-CM | POA: Diagnosis not present

## 2020-06-23 DIAGNOSIS — D631 Anemia in chronic kidney disease: Secondary | ICD-10-CM | POA: Diagnosis present

## 2020-06-23 DIAGNOSIS — Z825 Family history of asthma and other chronic lower respiratory diseases: Secondary | ICD-10-CM | POA: Diagnosis not present

## 2020-06-23 DIAGNOSIS — E1165 Type 2 diabetes mellitus with hyperglycemia: Secondary | ICD-10-CM

## 2020-06-23 DIAGNOSIS — R5381 Other malaise: Secondary | ICD-10-CM | POA: Diagnosis not present

## 2020-06-23 DIAGNOSIS — Z8249 Family history of ischemic heart disease and other diseases of the circulatory system: Secondary | ICD-10-CM

## 2020-06-23 DIAGNOSIS — K219 Gastro-esophageal reflux disease without esophagitis: Secondary | ICD-10-CM | POA: Diagnosis not present

## 2020-06-23 DIAGNOSIS — E785 Hyperlipidemia, unspecified: Secondary | ICD-10-CM | POA: Diagnosis present

## 2020-06-23 DIAGNOSIS — I5042 Chronic combined systolic (congestive) and diastolic (congestive) heart failure: Secondary | ICD-10-CM | POA: Diagnosis present

## 2020-06-23 DIAGNOSIS — Z833 Family history of diabetes mellitus: Secondary | ICD-10-CM | POA: Diagnosis not present

## 2020-06-23 DIAGNOSIS — E876 Hypokalemia: Secondary | ICD-10-CM | POA: Diagnosis present

## 2020-06-23 DIAGNOSIS — Z91010 Allergy to peanuts: Secondary | ICD-10-CM | POA: Diagnosis not present

## 2020-06-23 DIAGNOSIS — Z9841 Cataract extraction status, right eye: Secondary | ICD-10-CM

## 2020-06-23 DIAGNOSIS — J449 Chronic obstructive pulmonary disease, unspecified: Secondary | ICD-10-CM | POA: Diagnosis not present

## 2020-06-23 DIAGNOSIS — Z961 Presence of intraocular lens: Secondary | ICD-10-CM | POA: Diagnosis present

## 2020-06-23 DIAGNOSIS — I440 Atrioventricular block, first degree: Secondary | ICD-10-CM | POA: Diagnosis present

## 2020-06-23 LAB — GASTROINTESTINAL PANEL BY PCR, STOOL (REPLACES STOOL CULTURE)

## 2020-06-23 LAB — CBC
HCT: 32.4 % — ABNORMAL LOW (ref 39.0–52.0)
Hemoglobin: 11 g/dL — ABNORMAL LOW (ref 13.0–17.0)
MCH: 28.7 pg (ref 26.0–34.0)
MCHC: 34 g/dL (ref 30.0–36.0)
MCV: 84.6 fL (ref 80.0–100.0)
Platelets: 291 10*3/uL (ref 150–400)
RBC: 3.83 MIL/uL — ABNORMAL LOW (ref 4.22–5.81)
RDW: 15.2 % (ref 11.5–15.5)
WBC: 5.1 10*3/uL (ref 4.0–10.5)
nRBC: 0 % (ref 0.0–0.2)

## 2020-06-23 LAB — URINALYSIS, COMPLETE (UACMP) WITH MICROSCOPIC
Bilirubin Urine: NEGATIVE
Glucose, UA: NEGATIVE mg/dL
Hgb urine dipstick: NEGATIVE
Ketones, ur: NEGATIVE mg/dL
Nitrite: NEGATIVE
Protein, ur: 30 mg/dL — AB
Specific Gravity, Urine: 1.012 (ref 1.005–1.030)
pH: 5 (ref 5.0–8.0)

## 2020-06-23 LAB — BASIC METABOLIC PANEL
Anion gap: 15 (ref 5–15)
Anion gap: 17 — ABNORMAL HIGH (ref 5–15)
Anion gap: 15 (ref 5–15)
BUN: 109 mg/dL — ABNORMAL HIGH (ref 8–23)
BUN: 108 mg/dL — ABNORMAL HIGH (ref 8–23)
BUN: 98 mg/dL — ABNORMAL HIGH (ref 8–23)
CO2: 14 mmol/L — ABNORMAL LOW (ref 22–32)
CO2: 15 mmol/L — ABNORMAL LOW (ref 22–32)
CO2: 15 mmol/L — ABNORMAL LOW (ref 22–32)
Calcium: 8.8 mg/dL — ABNORMAL LOW (ref 8.9–10.3)
Calcium: 8.7 mg/dL — ABNORMAL LOW (ref 8.9–10.3)
Calcium: 8.4 mg/dL — ABNORMAL LOW (ref 8.9–10.3)
Chloride: 103 mmol/L (ref 98–111)
Chloride: 103 mmol/L (ref 98–111)
Chloride: 102 mmol/L (ref 98–111)
Creatinine, Ser: 5.37 mg/dL — ABNORMAL HIGH (ref 0.61–1.24)
Creatinine, Ser: 5.13 mg/dL — ABNORMAL HIGH (ref 0.61–1.24)
Creatinine, Ser: 4.45 mg/dL — ABNORMAL HIGH (ref 0.61–1.24)
GFR, Estimated: 11 mL/min — ABNORMAL LOW (ref >60)
GFR, Estimated: 12 mL/min — ABNORMAL LOW (ref >60)
GFR, Estimated: 14 mL/min — ABNORMAL LOW (ref >60)
Glucose, Bld: 140 mg/dL — ABNORMAL HIGH (ref 70–99)
Glucose, Bld: 112 mg/dL — ABNORMAL HIGH (ref 70–99)
Glucose, Bld: 134 mg/dL — ABNORMAL HIGH (ref 70–99)
Potassium: 2.8 mmol/L — ABNORMAL LOW (ref 3.5–5.1)
Potassium: 2.6 mmol/L — CL (ref 3.5–5.1)
Potassium: 2.6 mmol/L — CL (ref 3.5–5.1)
Sodium: 132 mmol/L — ABNORMAL LOW (ref 135–145)
Sodium: 135 mmol/L (ref 135–145)
Sodium: 132 mmol/L — ABNORMAL LOW (ref 135–145)

## 2020-06-23 LAB — C DIFFICILE QUICK SCREEN W PCR REFLEX
C Diff antigen: NEGATIVE
C Diff interpretation: NOT DETECTED
C Diff toxin: NEGATIVE

## 2020-06-23 LAB — TROPONIN I (HIGH SENSITIVITY)
Troponin I (High Sensitivity): 973 ng/L (ref <18)
Troponin I (High Sensitivity): 1106 ng/L (ref <18)
Troponin I (High Sensitivity): 887 ng/L (ref <18)
Troponin I (High Sensitivity): 1025 ng/L (ref <18)

## 2020-06-23 LAB — HEMOGLOBIN A1C
Hgb A1c MFr Bld: 6.3 % — ABNORMAL HIGH (ref 4.8–5.6)
Mean Plasma Glucose: 134.11 mg/dL

## 2020-06-23 LAB — GLUCOSE, CAPILLARY
Glucose-Capillary: 117 mg/dL — ABNORMAL HIGH (ref 70–99)
Glucose-Capillary: 119 mg/dL — ABNORMAL HIGH (ref 70–99)
Glucose-Capillary: 148 mg/dL — ABNORMAL HIGH (ref 70–99)
Glucose-Capillary: 130 mg/dL — ABNORMAL HIGH (ref 70–99)

## 2020-06-23 LAB — MAGNESIUM
Magnesium: 2.6 mg/dL — ABNORMAL HIGH (ref 1.7–2.4)
Magnesium: 2.4 mg/dL (ref 1.7–2.4)

## 2020-06-23 LAB — HIV ANTIBODY (ROUTINE TESTING W REFLEX): HIV Screen 4th Generation wRfx: NONREACTIVE

## 2020-06-23 LAB — MRSA PCR SCREENING: MRSA by PCR: NEGATIVE

## 2020-06-23 LAB — PHOSPHORUS: Phosphorus: 5.9 mg/dL — ABNORMAL HIGH (ref 2.5–4.6)

## 2020-06-23 LAB — SODIUM, URINE, RANDOM: Sodium, Ur: <10 mmol/L

## 2020-06-23 LAB — CREATININE, URINE, RANDOM: Creatinine, Urine: 118.88 mg/dL

## 2020-06-23 MED ORDER — POTASSIUM CHLORIDE 10 MEQ/100ML IV SOLN
10.0000 meq | INTRAVENOUS | Status: AC
  Administered 2020-06-23 – 2020-06-24 (×6): 10 meq via INTRAVENOUS
  Filled 2020-06-23 (×6): qty 100

## 2020-06-23 MED ORDER — ACETAMINOPHEN 325 MG PO TABS
650.0000 mg | ORAL_TABLET | Freq: Four times a day (QID) | ORAL | Status: DC | PRN
  Administered 2020-06-27: 650 mg via ORAL
  Filled 2020-06-23: qty 2

## 2020-06-23 MED ORDER — METOPROLOL TARTRATE 12.5 MG HALF TABLET
12.5000 mg | ORAL_TABLET | Freq: Two times a day (BID) | ORAL | Status: DC
  Administered 2020-06-23 (×2): 12.5 mg via ORAL
  Filled 2020-06-23 (×2): qty 1

## 2020-06-23 MED ORDER — ATORVASTATIN CALCIUM 10 MG PO TABS
20.0000 mg | ORAL_TABLET | Freq: Every day | ORAL | Status: DC
  Administered 2020-06-23 – 2020-06-27 (×5): 20 mg via ORAL
  Filled 2020-06-23 (×5): qty 2

## 2020-06-23 MED ORDER — ONDANSETRON HCL 4 MG/2ML IJ SOLN: 4.0000 mg | Freq: Four times a day (QID) | INTRAMUSCULAR | Status: DC | PRN

## 2020-06-23 MED ORDER — CHLORHEXIDINE GLUCONATE CLOTH 2 % EX PADS
6.0000 | MEDICATED_PAD | Freq: Every day | CUTANEOUS | Status: DC
  Administered 2020-06-23 – 2020-06-28 (×6): 6 via TOPICAL

## 2020-06-23 MED ORDER — ACETAMINOPHEN 650 MG RE SUPP: 650.0000 mg | Freq: Four times a day (QID) | RECTAL | Status: DC | PRN

## 2020-06-23 MED ORDER — POTASSIUM CHLORIDE CRYS ER 20 MEQ PO TBCR
40.0000 meq | EXTENDED_RELEASE_TABLET | Freq: Once | ORAL | Status: AC
  Administered 2020-06-23: 40 meq via ORAL
  Filled 2020-06-23: qty 2

## 2020-06-23 MED ORDER — INSULIN ASPART 100 UNIT/ML ~~LOC~~ SOLN
0.0000 [IU] | Freq: Three times a day (TID) | SUBCUTANEOUS | Status: DC
  Administered 2020-06-25 – 2020-06-27 (×4): 1 [IU] via SUBCUTANEOUS

## 2020-06-23 MED ORDER — POTASSIUM CHLORIDE 10 MEQ/100ML IV SOLN
10.0000 meq | INTRAVENOUS | Status: AC
  Administered 2020-06-23 (×6): 10 meq via INTRAVENOUS
  Filled 2020-06-23 (×5): qty 100

## 2020-06-23 MED ORDER — ASPIRIN 81 MG PO CHEW
81.0000 mg | CHEWABLE_TABLET | Freq: Every morning | ORAL | Status: DC
  Administered 2020-06-23 – 2020-06-28 (×6): 81 mg via ORAL
  Filled 2020-06-23 (×6): qty 1

## 2020-06-23 MED ORDER — ONDANSETRON HCL 4 MG PO TABS: 4.0000 mg | ORAL_TABLET | Freq: Four times a day (QID) | ORAL | Status: DC | PRN

## 2020-06-23 MED ORDER — HEPARIN SODIUM (PORCINE) 5000 UNIT/ML IJ SOLN
5000.0000 [IU] | Freq: Three times a day (TID) | INTRAMUSCULAR | Status: DC
  Administered 2020-06-23 – 2020-06-28 (×16): 5000 [IU] via SUBCUTANEOUS
  Filled 2020-06-23 (×15): qty 1

## 2020-06-23 MED ORDER — STERILE WATER FOR INJECTION IV SOLN
INTRAVENOUS | Status: AC
  Filled 2020-06-23 (×6): qty 1000

## 2020-06-23 MED ORDER — SENNOSIDES-DOCUSATE SODIUM 8.6-50 MG PO TABS: 1.0000 | ORAL_TABLET | Freq: Every evening | ORAL | Status: DC | PRN

## 2020-06-23 NOTE — H&P (Signed)
History and Physical    Frank Hoover ZGY:[id number] DOB: 06-Apr-1950 DOA: 06/23/2020  PCP: Hal Morales, DO   Patient coming from: SNF   Chief Complaint: Abnormal labs   HPI: Frank Hoover is a 70 y.o. male with medical history significant for HFrEF with AICD, CKD IIIb, COPD, IDDM, and HTN who presented to ED from SNF for evaluation of increased BUN and creatinine on routine labs. Patient denies chest pain or SOB, denies N/V, and denied dysuria or flank pain. He had diarrhea but states that this just started today. No recent change in medications reported.   [facility] ED Course: Upon arrival to the ED, patient is found to be afebrile, saturating well on rm air, normal HR and BP 112/70. EKG featured SR with 1st degree AV block and PVC. CXR negative for acute findings. CT head with no acute intracranial abnormality. Labs notable for sodium 131, potassium 2.5, bicarbonate 14, BUN 110, and SCr 5.90. ABG with pH 7.24, and pCO2 27. HS troponin was 659 then 688. He was given 325 mg aspirin, acetaminophen, and gabapentin. COVID-19 pcr was negative. He was transferred to [facility] due to no nephrology backup at [facility].   Review of Systems:  All other systems reviewed and apart from HPI, are negative.  Past Medical History:  Diagnosis Date  . Adenomatous colon polyp 10/30/09   Serrated adenoma removed during Colonoscopy   . AICD (automatic cardioverter/defibrillator) present 06/11/2016  . Arthritis    "legs" (09/27/2015)  . Atrial flutter with rapid ventricular response ([place]) 09/27/2015  . CHF (congestive heart failure) ([place])   . CKD (chronic kidney disease) stage 3, GFR 30-59 ml/min (HCC)   . COPD (chronic obstructive pulmonary disease) ([place])   . Diverticulosis of colon   . Dysrhythmia   . Essential hypertension   . Gastroparesis   . GERD (gastroesophageal reflux disease)   . Gout   . Hyperlipemia   . Hypertension   . OA (osteoarthritis)   . Type 2  diabetes mellitus ([place])     Past Surgical History:  Procedure Laterality Date  . CARDIOVERSION N/A 08/22/2015   Procedure: CARDIOVERSION;  Surgeon: Larey Dresser, MD;  Location: [place];  Service: Cardiovascular;  Laterality: N/A;  . CARDIOVERSION N/A 09/01/2015   Procedure: CARDIOVERSION;  Surgeon: Lelon Perla, MD;  Location: [place];  Service: Cardiovascular;  Laterality: N/A;  . CATARACT EXTRACTION W/ INTRAOCULAR LENS  IMPLANT, BILATERAL Bilateral   . COLONOSCOPY  10/30/09   RFF:[id number] diverticulum/serrated adenoma from ICV, next colonoscopy due 10/2012  . COLONOSCOPY N/A 09/18/2012   JTT:SVXBLTJ mucosa that was seen appeared normal, however most of it was not seen due to be very poor prep  . COLONOSCOPY N/A 04/08/2013   QZE:SPQZRAQ polyp removed/inadequate preparation  . COLONOSCOPY N/A 06/22/2014   Procedure: COLONOSCOPY;  Surgeon: Daneil Dolin, MD;  [facility];  Service: Endoscopy;  Laterality: N/A;  115   . ELECTROPHYSIOLOGIC STUDY N/A 09/27/2015   Procedure: SVT Ablation;  Surgeon: Evans Lance, MD;  Location: [place] [facility];  Service: Cardiovascular;  Laterality: N/A;  . EP IMPLANTABLE DEVICE  06/11/2016  . ESOPHAGOGASTRODUODENOSCOPY  10/30/09   TMA:UQJFHL   . FLEXIBLE SIGMOIDOSCOPY N/A 08/25/2015   Procedure: FLEXIBLE SIGMOIDOSCOPY;  Surgeon: Jerene Bears, MD;  Location: [facility] ENDOSCOPY;  Service: Endoscopy;  Laterality: N/A;  . ICD IMPLANT N/A 06/11/2016   Procedure: ICD Implant;  Surgeon: Evans Lance, MD;  Location: [place] [facility] LAB;  Service: Cardiovascular;  Laterality: N/A;  . TEE WITHOUT CARDIOVERSION N/A 08/22/2015   Procedure: TRANSESOPHAGEAL ECHOCARDIOGRAM (TEE);  Surgeon: Larey Dresser, MD;  Location: [place];  Service: Cardiovascular;  Laterality: N/A;  . TEE WITHOUT CARDIOVERSION  09/27/2015   Procedure: Transesophageal Echocardiogram (Tee);  Surgeon: Evans Lance, MD;  Location: [place] [facility];  Service: Cardiovascular;;   . TONSILLECTOMY  1950s/1960s    Social History:   reports that he quit smoking about 9 years ago. His smoking use included cigarettes. He has a 4.00 pack-year smoking history. He has never used smokeless tobacco. He reports that he does not drink alcohol and does not use drugs.  Allergies  Allergen Reactions  . Peanut Butter Flavor     Family History  Problem Relation Age of Onset  . COPD Father   . Diabetes Mother   . Hypertension Mother   . Colon cancer Neg Hx      Prior to Admission medications   Medication Sig Start Date End Date Taking? Authorizing Provider  acetaminophen (TYLENOL) 325 MG tablet Take 2 tablets (650 mg total) by mouth every 6 (six) hours as needed for mild pain (or Fever >/= 101). 09/12/18   Sinda Du, MD  albuterol (VENTOLIN HFA) 108 (90 Base) MCG/ACT inhaler Inhale into the lungs every 6 (six) hours as needed for wheezing or shortness of breath.    [provider]  allopurinol (ZYLOPRIM) 100 MG tablet Take 2 tablets (200 mg total) by mouth daily. 09/13/18   Sinda Du, MD  aspirin 81 MG chewable tablet Chew 81 mg by mouth every morning.  05/18/19   [provider]  atorvastatin (LIPITOR) 20 MG tablet Take 20 mg by mouth at bedtime.    [provider]  cholecalciferol (VITAMIN D) 1000 units tablet Take 1,000 Units by mouth daily.    [provider]  docusate sodium (COLACE) 100 MG capsule Take 2 capsules (200 mg total) by mouth at bedtime. 09/12/18   Sinda Du, MD  ferrous sulfate 325 (65 FE) MG tablet Take 325 mg by mouth 2 (two) times daily with a meal.    [provider]  gabapentin (NEURONTIN) 400 MG capsule Take 400 mg by mouth daily.     [provider]  HUMULIN 70/30 KWIKPEN (70-30) 100 UNIT/ML KwikPen  08/16/19   [provider]  hydrALAZINE (APRESOLINE) 100 MG tablet Take 1 tablet (100 mg total) by mouth 3 (three) times daily. 07/20/18   Arnoldo Lenis, MD   HYDROcodone-acetaminophen (NORCO/VICODIN) 5-325 MG tablet Take 1 tablet by mouth every 6 (six) hours as needed. 07/01/18   [provider]  insulin aspart (NOVOLOG) 100 UNIT/ML injection Inject 0-20 Units into the skin 3 (three) times daily with meals. 09/12/18   Sinda Du, MD  insulin aspart (NOVOLOG) 100 UNIT/ML injection Inject 0-5 Units into the skin at bedtime. 09/12/18   Sinda Du, MD  insulin glargine (LANTUS) 100 UNIT/ML injection Inject 0.25 mLs (25 Units total) into the skin daily. 09/12/18   Sinda Du, MD  isosorbide mononitrate (IMDUR) 30 MG 24 hr tablet TAKE 1 TABLET DAILY 10/26/18   Evans Lance, MD  linaclotide [facility]) 145 MCG CAPS capsule Take 1 capsule (145 mcg total) by mouth daily before breakfast. 07/02/18   Sinda Du, MD  loperamide (IMODIUM) 2 MG capsule Take by mouth as needed for diarrhea or loose stools.    [provider]  loratadine (CLARITIN) 10 MG tablet Take 10 mg by mouth daily.  [provider]  magnesium hydroxide (MILK OF MAGNESIA) 400 MG/5ML suspension Take by mouth daily as needed for mild constipation.    [provider]  metoCLOPramide (REGLAN) 5 MG tablet Take 5 mg by mouth 3 (three) times daily before meals.    [provider]  metoprolol tartrate (LOPRESSOR) 50 MG tablet Take by mouth.    [provider]  Multiple Vitamin (MULTIVITAMIN WITH MINERALS) TABS tablet Take 1 tablet by mouth daily. 09/07/15   Hosie Poisson, MD  pantoprazole (PROTONIX) 20 MG tablet Take 20 mg by mouth 2 (two) times daily. 02/23/20   [provider]  pantoprazole (PROTONIX) 40 MG tablet Take 1 tablet (40 mg total) by mouth daily. 09/12/18   Sinda Du, MD  potassium chloride SA (KLOR-CON) 20 MEQ tablet Take 20 mEq by mouth daily.     [provider]  sertraline (ZOLOFT) 50 MG tablet Take 50 mg by mouth daily. 02/14/20   [provider]  torsemide (DEMADEX) 20 MG tablet Take 20  mg by mouth daily. 07/28/19   [provider]  vitamin C (ASCORBIC ACID) 500 MG tablet Take 500 mg by mouth 2 (two) times daily.     [provider]    Physical Exam: Vitals:   06/23/20 0126  BP: 114/63  Pulse: 79  Temp: 97.9 F (36.6 C)  TempSrc: Oral  SpO2: 100%  Weight: 77.3 kg    Constitutional: NAD, calm  Eyes: PERTLA, lids and conjunctivae normal ENMT: Mucous membranes are moist. Posterior pharynx clear of any exudate or lesions.   Neck:  supple, no masses  Respiratory: no wheezing, no crackles. No accessory muscle use.  Cardiovascular: S1 & S2 heard, regular rate and rhythm. No extremity edema.  Abdomen: No distension, no tenderness, soft. Bowel sounds active.  Musculoskeletal: no clubbing / cyanosis. No joint deformity upper and lower extremities.   Skin: no significant rashes, lesions, ulcers. Warm, dry, well-perfused. Neurologic: CN 2-12 grossly intact. Sensation intact. Moving all extremities.  Psychiatric: Sleeping, wakes to voice. Answers basic questions appropriately but quickly falls asleep. Cooperative.    Labs and Imaging on Admission: I have personally reviewed following labs and imaging studies  CBC: No results for input(s): WBC, NEUTROABS, HGB, HCT, MCV, PLT in the last 168 hours. Basic Metabolic Panel: No results for input(s): NA, K, CL, CO2, GLUCOSE, BUN, CREATININE, CALCIUM, MG, PHOS in the last 168 hours. GFR: CrCl cannot be calculated (Patient's most recent lab result is older than the maximum 21 days allowed.). Liver Function Tests: No results for input(s): AST, ALT, ALKPHOS, BILITOT, PROT, ALBUMIN in the last 168 hours. No results for input(s): LIPASE, AMYLASE in the last 168 hours. No results for input(s): AMMONIA in the last 168 hours. Coagulation Profile: No results for input(s): INR, PROTIME in the last 168 hours. Cardiac Enzymes: No results for input(s): CKTOTAL, CKMB, CKMBINDEX, TROPONINI in the last 168 hours. BNP (last 3  results) No results for input(s): PROBNP in the last 8760 hours. HbA1C: No results for input(s): HGBA1C in the last 72 hours. CBG: No results for input(s): GLUCAP in the last 168 hours. Lipid Profile: No results for input(s): CHOL, HDL, LDLCALC, TRIG, CHOLHDL, LDLDIRECT in the last 72 hours. Thyroid Function Tests: No results for input(s): TSH, T4TOTAL, FREET4, T3FREE, THYROIDAB in the last 72 hours. Anemia Panel: No results for input(s): VITAMINB12, FOLATE, FERRITIN, TIBC, IRON, RETICCTPCT in the last 72 hours. Urine analysis:    Component Value Date/Time   COLORURINE AMBER (A) 09/07/2018  2227   APPEARANCEUR CLOUDY (A) 09/07/2018 2227   LABSPEC 1.015 09/07/2018 2227   PHURINE 5.0 09/07/2018 2227   GLUCOSEU NEGATIVE 09/07/2018 2227   HGBUR NEGATIVE 09/07/2018 2227   BILIRUBINUR NEGATIVE 09/07/2018 2227   KETONESUR NEGATIVE 09/07/2018 2227   PROTEINUR 30 (A) 09/07/2018 2227   UROBILINOGEN 0.2 12/02/2013 1739   NITRITE NEGATIVE 09/07/2018 2227   LEUKOCYTESUR LARGE (A) 09/07/2018 2227   Sepsis Labs: @LABRCNTIP (procalcitonin:4,lacticidven:4) )No results found for this or any previous visit (from the past 240 hour(s)).   Radiological Exams on Admission: No results found.  EKG: Sinus rhythm, 1st degree AV block, PVC.   Assessment/Plan   1. Acute kidney injury superimposed on CKD IIIb; metabolic acidosis  - Presents from SNF for eval of increased BUN and SCr on routine labs, noted to have BUN 110 and SCr 5.90 (reportedly 1.8 in Feb '22); serum bicarbonate is 14, pH 7.24 with pCO2 27  - Check UA, urine chemistries, and renal US; renally-dose medications, start isotonic bicarbonate infusion, monitor I/Os, repeat chem panels   2. Chronic combined systolic & diastolic CHF  - EF was 58-68% with diffuse hypokinesis, grade 1 diastolic dysfunction, mild LAE, and mild MR in 2018  - Appears hypovolemic on admission  - Hold diuretic and ACE/ARB, start cautious IVF hydration, monitor  strict I/Os and weight    3. Elevated troponin   - HS troponin was 659 then 688 in ED without chest pain or SOB  - Check EKG, trend troponin, check echocardiogram   4. Insulin-dependent DM - A1c was 7.2% in May 2020  - Check CBGs and use a low-intensity SSI    5. Hypokalemia  - Serum potassium 2.5 in ED and was treated with 40 mEq oral potassium  - Monitor, replace cautiously in setting of renal failure    6. Hypertension  - BP at goal, treat as-needed only for now     DVT prophylaxis: sq heparin  Code Status: Full, confirmed with patient  Level of Care: Level of care: Telemetry Medical Family Communication: None available at time of admission  Disposition Plan:  Patient is from: SNF  Anticipated d/c is to: SNF  Anticipated d/c date is: 06/28/20 Patient currently: Pending renal US, echocardiogram, improvement in renal function  Consults called: None  Admission status: Inpatient     Vianne Bulls, MD [facility]  06/23/2020, 3:18 AM

## 2020-06-23 NOTE — Consult Note (Addendum)
Cardiology Consultation:   Patient ID: Frank Hoover MRN: [id number]; DOB: 04-06-1950  Admit date: 06/23/2020 Date of Consult: 06/23/2020  PCP:  Hal Morales, DO   [name]  Cardiologist:  Carlyle Dolly, MD  Advanced Practice Provider:  No care team member to display Electrophysiologist:  None    Patient Profile:   Frank Hoover is a 70 y.o. 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 Cardiology is consulted for elevated troponin.     History of Present Illness:   Frank Hoover with above complex PMH follows with general cardiologist Dr. Harl Bowie primarily since 2017.  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 He underwent TEE/DCCV. During admission he also had GIB due to ischemic colitis/recto-sigmoid area. He ultimately underwent EP study and ablation on 09/27/2015 by Dr. Lovena Le. In follow-up 10/2015, Eliquis and amiodarone were stopped. His EF remained persistently low at 30-35% despite medical therapy. He had not had ischemic evaluation as this was deferred due to his renal insufficiency. He underwent Boston Sci dual chamber ICD implant by Dr Lovena Le on 06/11/2016. He was last seen by Dr Lovena Le in the office on 03/01/20 and device check was normal. Dr. Lovena Le mentioned he had had some interim atrial fibrillation but details unclear. He was seen by APP via virtual visit on 02/04/2020 for CHF follow up and was felt stable with weight around 180s without exacerbating symptoms, he was recommended metoprolol XL 136m BID and torsemide 233mdaily. His last device check was on 06/12/2020 which showed  normal device function, no shock has been delivered, and battery life is 12 yrs.   He follows nephrology outpatient Dr BhTheador Hawthorneor CKD stage IIIb, diagnosed since 2011 due to DM. He was last seen on 05/10/20 in the office and felt CKD was progressing and multiple AKIs was reported.   [facility] for evaluation of worsening BUN/Cr on routine labs. He had reported with diarrhea that started today, and no other complaints.   Diagnostic here showed hyponatremia 132, hypokalemia 2.8, non-anion gap metabolic acidosis with bicarb 14, creatinine 5. 37, BUN 109.  Hyperphosphatemia 5.9, hypomagnesia 2.6.  CBC revealed normocytic anemia with hemoglobin 11.  Hs Troponin elevated 973 > 1106 >973.  Hemoglobin A1c 6.3%.  HIV negative. UA + leukocytes and protein bacteria.  C. difficile negative.  Renal ultrasound reviewed medical renal disease, no acute abnormality.  EKG pending.  [facility],  ABG with pH 7.24, and pCO2 27. COVID 19 negative. EKG showed SR with 1st degree AV block. Trop was elevated 659 >688. He was stable without chest pain.  He did receive ASA 32512mhere before transfer here. Cardiology is consulted today for elevated troponin and abnormal EKG.   During encounter, patient states he has been feeling weak, fatigued. He was getting PT at SNF, where they found abnormal kidney blood work. He reports 1 week onset of intermittent abdominal pain, 2-3 loose stool daily. He noted he has been having difficulty urinating as well. He is taking his diuretics daily. He endorse mild toes numbness, otherwise without hands paresthesia. He  denied any chest pain, chest pressure, heart palpitation, orthopnea, PND, dizziness, syncope, or leg edema. He denied any sensation being shocked by his ICD.    Past Medical History:  Diagnosis Date  . Adenomatous colon polyp 10/30/09   Serrated adenoma removed during Colonoscopy   . AICD (automatic  cardioverter/defibrillator) present 06/11/2016  . Arthritis    "legs" (09/27/2015)  . Atrial flutter with rapid ventricular response ([place]) 09/27/2015  . CHF (congestive heart failure) ([place])   . CKD (chronic kidney disease) stage 3, GFR 30-59 ml/min (HCC)   . COPD (chronic obstructive pulmonary disease) ([place])   . Diverticulosis of colon   . Dysrhythmia   . Essential hypertension   . Gastroparesis   . GERD (gastroesophageal reflux disease)   . Gout   . Hyperlipemia   . Hypertension   . OA (osteoarthritis)   . Type 2 diabetes mellitus ([place])     Past Surgical History:  Procedure Laterality Date  . CARDIOVERSION N/A 08/22/2015   Procedure: CARDIOVERSION;  Surgeon: Larey Dresser, MD;  Location: [place];  Service: Cardiovascular;  Laterality: N/A;  . CARDIOVERSION N/A 09/01/2015   Procedure: CARDIOVERSION;  Surgeon: Lelon Perla, MD;  Location: [place];  Service: Cardiovascular;  Laterality: N/A;  . CATARACT EXTRACTION W/ INTRAOCULAR LENS  IMPLANT, BILATERAL Bilateral   . COLONOSCOPY  10/30/09   ONG:EXBM-WUXLK diverticulum/serrated adenoma from ICV, next colonoscopy due 10/2012  . COLONOSCOPY N/A 09/18/2012   GMW:NUUVOZD mucosa that was seen appeared normal, however most of it was not seen due to be very poor prep  . COLONOSCOPY N/A 04/08/2013   GUY:QIHKVQQ polyp removed/inadequate preparation  . COLONOSCOPY N/A 06/22/2014   Procedure: COLONOSCOPY;  Surgeon: Daneil Dolin, MD;  [facility];  Service: Endoscopy;  Laterality: N/A;  115   . ELECTROPHYSIOLOGIC STUDY N/A 09/27/2015   Procedure: SVT Ablation;  Surgeon: Evans Lance, MD;  Location: [place] CV LAB;  Service: Cardiovascular;  Laterality: N/A;  . EP IMPLANTABLE DEVICE  06/11/2016  . ESOPHAGOGASTRODUODENOSCOPY  10/30/09   VZD:GLOVFI   . FLEXIBLE SIGMOIDOSCOPY N/A 08/25/2015   Procedure: FLEXIBLE SIGMOIDOSCOPY;  Surgeon: Jerene Bears, MD;  Location: [facility] ENDOSCOPY;  Service: Endoscopy;  Laterality: N/A;  .  ICD IMPLANT N/A 06/11/2016   Procedure: ICD Implant;  Surgeon: Evans Lance, MD;  Location: [place] CV LAB;  Service: Cardiovascular;  Laterality: N/A;  . TEE WITHOUT CARDIOVERSION N/A 08/22/2015   Procedure: TRANSESOPHAGEAL ECHOCARDIOGRAM (TEE);  Surgeon: Larey Dresser, MD;  Location: [place];  Service: Cardiovascular;  Laterality: N/A;  . TEE WITHOUT CARDIOVERSION  09/27/2015   Procedure: Transesophageal Echocardiogram (Tee);  Surgeon: Evans Lance, MD;  Location: [place] CV LAB;  Service: Cardiovascular;;  . TONSILLECTOMY  1950s/1960s     Home Medications:  Prior to Admission medications   Medication Sig Start Date End Date Taking? Authorizing Provider  acetaminophen (TYLENOL) 325 MG tablet Take 2 tablets (650 mg total) by mouth every 6 (six) hours as needed for mild pain (or Fever >/= 101). 09/12/18   Sinda Du, MD  albuterol (VENTOLIN HFA) 108 (90 Base) MCG/ACT inhaler Inhale into the lungs every 6 (six) hours as needed for wheezing or shortness of breath.    [provider]  allopurinol (ZYLOPRIM) 100 MG tablet Take 2 tablets (200 mg total) by mouth daily. 09/13/18   Sinda Du, MD  aspirin 81 MG chewable tablet Chew 81 mg by mouth every morning.  05/18/19   [provider]  atorvastatin (LIPITOR) 20 MG tablet Take 20 mg by mouth at bedtime.    [provider]  cholecalciferol (VITAMIN D) 1000 units tablet Take 1,000 Units by mouth daily.    [provider]  docusate sodium (COLACE) 100 MG capsule Take 2 capsules (200 mg total) by mouth at bedtime. 09/12/18   Sinda Du, MD  ferrous sulfate 325 (65 FE) MG tablet Take 325 mg by mouth 2 (two) times daily with a meal.    [provider]  gabapentin (NEURONTIN) 400 MG capsule Take 400 mg by mouth daily.     [provider]  HUMULIN 70/30 KWIKPEN (70-30) 100 UNIT/ML KwikPen  08/16/19   [provider]  hydrALAZINE (APRESOLINE) 100 MG tablet Take 1 tablet  (100 mg total) by mouth 3 (three) times daily. 07/20/18   Arnoldo Lenis, MD  HYDROcodone-acetaminophen (NORCO/VICODIN) 5-325 MG tablet Take 1 tablet by mouth every 6 (six) hours as needed. 07/01/18   [provider]  insulin aspart (NOVOLOG) 100 UNIT/ML injection Inject 0-20 Units into the skin 3 (three) times daily with meals. 09/12/18   Sinda Du, MD  insulin aspart (NOVOLOG) 100 UNIT/ML injection Inject 0-5 Units into the skin at bedtime. 09/12/18   Sinda Du, MD  insulin glargine (LANTUS) 100 UNIT/ML injection Inject 0.25 mLs (25 Units total) into the skin daily. 09/12/18   Sinda Du, MD  isosorbide mononitrate (IMDUR) 30 MG 24 hr tablet TAKE 1 TABLET DAILY 10/26/18   Evans Lance, MD  linaclotide [facility]) 145 MCG CAPS capsule Take 1 capsule (145 mcg total) by mouth daily before breakfast. 07/02/18   Sinda Du, MD  loperamide (IMODIUM) 2 MG capsule Take by mouth as needed for diarrhea or loose stools.    [provider]  loratadine (CLARITIN) 10 MG tablet Take 10 mg by mouth daily.    [provider]  magnesium hydroxide (MILK OF MAGNESIA) 400 MG/5ML suspension Take by mouth daily as needed for mild constipation.    [provider]  metoCLOPramide (REGLAN) 5 MG tablet Take 5 mg by mouth 3 (three) times daily before meals.    [provider]  metoprolol tartrate (LOPRESSOR) 50 MG tablet Take by mouth.    [provider]  Multiple Vitamin (MULTIVITAMIN WITH MINERALS) TABS tablet Take 1 tablet by mouth daily. 09/07/15   Hosie Poisson, MD  pantoprazole (PROTONIX) 20 MG tablet Take 20 mg by mouth 2 (two) times daily. 02/23/20   [provider]  pantoprazole (PROTONIX) 40 MG tablet Take 1 tablet (40 mg total) by mouth daily. 09/12/18   Sinda Du, MD  potassium chloride SA (KLOR-CON) 20 MEQ tablet Take 20 mEq by mouth daily.     [provider]  sertraline (ZOLOFT) 50 MG tablet Take 50 mg by mouth daily.  02/14/20   [provider]  torsemide (DEMADEX) 20 MG tablet Take 20 mg by mouth daily. 07/28/19   [provider]  vitamin C (ASCORBIC ACID) 500 MG tablet Take 500 mg by mouth 2 (two) times daily.     [provider]    Inpatient Medications: Scheduled Meds: . aspirin  81 mg Oral q morning  . atorvastatin  20 mg Oral QHS  . heparin  5,000 Units Subcutaneous Q8H  . insulin aspart  0-6 Units Subcutaneous TID WC  . metoprolol tartrate  12.5 mg Oral BID   Continuous Infusions: . potassium chloride 10 mEq (06/23/20 1439)  .  sodium bicarbonate (isotonic) infusion in sterile water  100 mL/hr at 06/23/20 1202   PRN Meds: acetaminophen **OR** acetaminophen, ondansetron **OR** ondansetron (ZOFRAN) IV, senna-docusate  Allergies:    Allergies  Allergen Reactions  . Peanut Butter Flavor     Social History:   Social History   Socioeconomic History  . Marital status: Married    Spouse name: Not on file  . Number of children: 2  . Years of education: Not on file  . Highest education level: Not on file  Occupational History  . Occupation: retired  Tobacco Use  . Smoking status: Former Smoker    Packs/day: 1.00    Years: 4.00    Pack years: 4.00    Types: Cigarettes    Quit date: 08/26/2010    Years since quitting: 9.8  . Smokeless tobacco: Never Used  Vaping Use  . Vaping Use: Never used  Substance and Sexual Activity  . Alcohol use: No    Alcohol/week: 0.0 standard drinks    Comment: "stopped in 2012 when I quit smoking"  . Drug use: No  . Sexual activity: Not Currently  Other Topics Concern  . Not on file  Social History Narrative  . Not on file   Social Determinants of Health   Financial Resource Strain: Not on file  Food Insecurity: Not on file  Transportation Needs: Not on file  Physical Activity: Not on file  Stress: Not on file  Social Connections: Not on file  Intimate Partner Violence: Not on file    Family History:    Family  History  Problem Relation Age of Onset  . COPD Father   . Diabetes Mother   . Hypertension Mother   . Colon cancer Neg Hx      ROS:  Constitutional: Denied fever, chills, malaise, night sweats Eyes: Denied vision change or loss Ears/Nose/Mouth/Throat: Denied ear ache, sore throat, coughing, sinus pain Cardiovascular: see HPI  Respiratory: denied shortness of breath Gastrointestinal: see HPI  Genital/Urinary: see HPI  Musculoskeletal: global weakness  Skin: Denied rash, wound Neuro: Denied headache, dizziness, syncope Psych: Denied history of depression/anxiety  Endocrine: history of diabetes   Physical Exam/Data:   Vitals:   06/23/20 0126 06/23/20 0400 06/23/20 0500 06/23/20 0834  BP: 114/63  116/70 121/69  Pulse: 79  80 73  Resp:  11 16 16   Temp: 97.9 F (36.6 C)  (!) 97.5 F (36.4 C) 97.7 F (36.5 C)  TempSrc: Oral  Oral Oral  SpO2: 100%  100% 99%  Weight: 77.3 kg       Intake/Output Summary (Last 24 hours) at 06/23/2020 1458 Last data filed at 06/23/2020 1100 Gross per 24 hour  Intake 205.03 ml  Output 850 ml  Net -644.97 ml   Last 3 Weights 06/23/2020 03/09/2020 03/01/2020  Weight (lbs) 170 lb 6.7 oz 170 lb 170 lb  Weight (kg) 77.3 kg 77.111 kg 77.111 kg     Body mass index is 27.51 kg/m.   Vitals:  Vitals:   06/23/20 0500 06/23/20 0834  BP: 116/70 121/69  Pulse: 80 73  Resp: 16 16  Temp: (!) 97.5 F (36.4 C) 97.7 F (36.5 C)  SpO2: 100% 99%   General Appearance: In no apparent distress, laying in bed, fatigued  HEENT: Normocephalic, atraumatic. EOMs intact. Oral cavity is dry Neck: Supple, trachea midline, no JVD Cardiovascular: Regular rate and rhythm, normal S1-S2,  no murmur/rub/gallop Respiratory: Resting breathing unlabored, lungs sounds with upper airway congestion and rhonchi noted bilaterally, no use of accessory muscles. On room air.  Gastrointestinal: Bowel sounds positive, abdomen obese, soft, mild epigastric tenderness, non-distended.  No rigidity or guarding. On going diarrhea.  Extremities: Able to move all extremities in bed without difficulty, no edema/cyanosis/clubbing Genitourinary: Foley in place, urine yellow with sediments  Musculoskeletal: Normal muscle bulk and tone, global weakness noted  Skin: Intact, warm, dry, decreased skin turgor noted, generalized peeling noted. No rashes or petechiae noted in exposed areas.  Neurologic: Alert, oriented to person, place and time. Right ptosis noted.  Fluent speech, no cognitive deficit, globally weak Psychiatric: Normal affect. Mood is appropriate.    EKG:  The EKG was personally reviewed and demonstrates:  EKG from 06/22/20 showed sinus rhythm, first degree AV block, LVH, old inferior infarct, no acute ST-T changes noted. EKG from today with SR, nonspecific T wave changes.   Telemetry:  Telemetry was personally reviewed and demonstrates:  Sinus rhythm +/- first degree AV block, ventricular rate of 80s, occasional PVCs.   Relevant CV Studies:  Echo from Jul 15, 2020 is pending    Echo from 04/01/2016:  - Left ventricle: The cavity size was normal. Systolic function was  moderately to severely reduced. The estimated ejection fraction  was in the range of 30% to 35%. Diffuse hypokinesis. Doppler  parameters are consistent with abnormal left ventricular  relaxation (grade 1 diastolic dysfunction). Mild to moderate LVH.  - Mitral valve: There was mild regurgitation.  - Left atrium: The atrium was mildly dilated.  - Right ventricle: Systolic function was mildly to moderately  reduced.  - Atrial septum: No defect or patent foramen ovale was identified.   Laboratory Data:  High Sensitivity Troponin:   Recent Labs  Lab 2020/07/15 0305 07-15-2020 0538 2020/07/15 1316  TROPONINIHS 973* 1,106* 887*     Chemistry Recent Labs  Lab 2020/07/15 0305 07/15/2020 1005  NA 132* 135  K 2.8* 2.6*  CL 103 103  CO2 14* 15*  GLUCOSE 140* 112*  BUN 109* 108*  CREATININE 5.37*  5.13*  CALCIUM 8.8* 8.7*  GFRNONAA 11* 12*  ANIONGAP 15 17*    No results for input(s): PROT, ALBUMIN, AST, ALT, ALKPHOS, BILITOT in the last 168 hours. Hematology Recent Labs  Lab 07/15/20 0305  WBC 5.1  RBC 3.83*  HGB 11.0*  HCT 32.4*  MCV 84.6  MCH 28.7  MCHC 34.0  RDW 15.2  PLT 291   BNPNo results for input(s): BNP, PROBNP in the last 168 hours.  DDimer No results for input(s): DDIMER in the last 168 hours.   Radiology/Studies:  US RENAL  Result Date: 07-15-2020 CLINICAL DATA:  Acute renal failure on a background of CKD stage 3b EXAM: RENAL / URINARY TRACT ULTRASOUND COMPLETE COMPARISON:  Ultrasound 09/08/2018 FINDINGS: Right Kidney: Renal measurements: 9 x 4.5 x 3.8 cm = volume: 80.6 mL. Increased renal cortical echogenicity. No concerning renal mass, shadowing calculus or hydronephrosis. Partially obscured by adjacent bowel gas. Left Kidney: Renal measurements: 9.1 x 4.8 x 4.2 cm = volume: 98 mL. Increased renal cortical echogenicity. No visible concerning renal mass, shadowing calculus or hydronephrosis. Partially obscured by adjacent bowel gas and rib shadowing. Bladder: Bladder partially decompressed by inflated Foley catheter. Some mild bladder wall thickening is nonspecific given instrumentation. Other: None. IMPRESSION: 1. Increased renal cortical echogenicity compatible with medical renal disease. 2. No other acute renal abnormalities though both kidneys are partially obscured by adjacent bowel gas. 3. Bladder partially decompressed by Foley catheter. Mild bladder wall thickening is nonspecific given instrumentation. Electronically Signed   By: Elwin Sleight.D.  On: 06/23/2020 04:05     Assessment and Plan:    Elevated troponin -Troponin uptrending 973 > 1106 >973, ok to stop trend  -EKG revealed LVH with strain changes  Not acute   -The patient  has no chest pain or ACS equivalent symptoms at this time  -Suspect demand ischemia in setting of AKI, electrolyte  abnormalities and known CHF    -agree with Echo update. This has been ordered   - will resume ASA 765m daily and low dose Metoprolol 12.5265mBID for now No plans for ischemic work up at present   He actually never had one (for various reasons)   Has never had discomfort   CT - appears no official ischemic workup in the past due to renal insufficiency, may consider non-urgent NM stress test for ischemia evaluation when AKI, acute diarrhea resolves   Chronic combined heart failure -Last Echo from 04/01/2016 with moderate to severely reduced systolic function of LV, EF 30 to 35%, diffuse hypokinesis, grade 1 diastolic dysfunction, mild to moderate LVH, mild MR, mild dilatation of LA, mild to moderate reduced RV systolic function - Repeat Echo is pending today  - s/p AICD, device function without problem on 06/12/2020  - clinically hypovolemic  -on Torsemide 20 mg daily and metoprolol 50 mg at home, currently held by primary team due to dehydration, AKI - will resume Bblocker at lower dose, monitor BP tolerance, not on ACEi/ARB/Entresto/Aldactone due to CKD stage IIIa with progression   Paroxysmal A. Fib/flutter and AVNRT  - s/p ablation in 2017, Eliquis and Amiodarone were stopped since. - currently in sinus rhythm, Dr TaLovena Le2/29/21 notes mentioned in and out of Afib, may consider resume anticoagulation given CHA2DS2VASc score is 3 due to HTN, CHF, DM - will resume low dose metoprolol 12.65m56mID today  AKI on CKD stage IIIb Non- anion gap metabolic acidosis Hyponatremia Hypokalemia Hypermagnesium Hyperphosphatemia - concerning for approaching ESRD  - Baseline creatinine 1.5-1.8 from 2020, presented with creatinine 5.37 and BUN of 108 -Urine study and clinical exam concerning for dehydration, lasix held, currently on IVF with sodium bicarb per primary team  - recommend consult nephrology if lacking improvement - monitor daily electrolyte, replete for hypokalemia  Abdominal pain with  diarrhea - 1 week onset, possible contributing to pre-renal AKI - defer workup and management to primary team   Hypertension  - BP low normal 114/63 - 121/69, will resume low dose metoprolol 12.65mg28mD - home meds hydralazine and Imdur on hold, monitor BP trend for now   Type 2 diabetes - A1C 6.3% from 06/23/20, managed per primary team      Risk Assessment/Risk Scores:    New York Heart Association (NYHA) Functional Class NYHA Class II  CHA2DS2-VASc Score = 4  This indicates a 4.8% annual risk of stroke. The patient's score is based upon: CHF History: Yes HTN History: Yes Diabetes History: Yes Stroke History: No Vascular Disease History: No Age Score: 1 Gender Score: 0      For questions or updates, please contact [facility] consult [email or web address] for contact info under    Signed, XikaMargie Billet  06/23/2020 2:58 PM   Patinet seen and examined  I have amended the  note above by X ZhEarlie Ravelingreflect my findings    Pt is a 69 y60with presumed NICM ( no prior testing)   Followed in cardiology (EP) for ICD  REmote aflutter/SVT, s/p ablation    Last seen in clinci by Dr TaylLovena Le  in 12/21.  No hx of CP  The [facility].   Found to have marked elevation in BUN/CR   Trop also checked which was elevated   Transferred to [facility]   The pt denies CP   ON exam, pt in NAD laying in bed Neck:  JVP normal  Lungs are CTA Abd   Supple   + Borborygmi Ext    No LE edema  SL tenting of ski  Labs signfi for  Trop elevation with peak 1100 K 2.6   CO2 15   Cr 5.37 EKG show SR LVH with repolarization abnormaltihy  Unchnaged from previous   Echo ordered  IMpression:  1 Elevated troponin   Troponin elevation difficult to interpret in setting of such severe renal dysfunciton.   Pt also with profound electrolyte abnormaities   The pt has remained asymtpomatic thorughout  Would treat with ASA and statin   I would not anticoagulate Check echo to  reassess LV function    WIthout hx of CP and with other medical issues would plan conservative Rx for now.    WIll continue to follow    Dorris Carnes MD

## 2020-06-23 NOTE — Progress Notes (Signed)
Received male pt from [facility] via stretcher with transporter around, pt alert oriented not in distress on room air, with peripheral on right forearm, with forley catheter to bag with urine output, bag dated 4/20, transferred pt to bed, activities well tolerated, oriented pt to room, and use of nurse call bell, skin integrity was assessed with CN Blanch Media, no further inquiry was made

## 2020-06-23 NOTE — Progress Notes (Addendum)
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  On presentation he was hemodynamically stable.  Labs showed creatinine of 5.9.  Troponin was elevated.  Potassium was 2.5.  Patient was transferred here for further work-up. Though he does not have any chest pain, troponin remain severely  elevated.  Cardiology consulted today, though this could be secondary to severe AKI.  Assessment & Plan:   Principal Problem:   Acute renal failure superimposed on stage 3b chronic kidney disease (HCC) Active Problems:   COPD (chronic obstructive pulmonary disease) (HCC)   HTN (hypertension)   Chronic combined systolic and diastolic CHF (congestive heart failure) (HCC)   Uncontrolled type 2 diabetes mellitus with complication (HCC)   Hypokalemia   Metabolic acidosis   Elevated troponin   AKI on CKD stage IIIb/metabolic acidosis: Baseline creatinine around 1.8.  Severe elevated creatinine on presentation with 5.9.  Serum bicarb was 14. Urine sodium is less than 10 indicating that he is severely dehydrated.  He was having severe diarrhea when I evaluated him.  He was also on diuretics, ACE/ARB at nursing facility which are on hold. Continue current IV fluids.  Continue to monitor kidney function, avoid nephrotoxins.  Renal ultrasound showed Increased renal cortical echogenicity , no obvious obstruction.  He has a chronic indwelling Foley catheter.  Severe diarrhea: This could have contributed to the dehydration.  Will check C. difficile, GI pathogen panel.  Abdomen  is soft and nontender.  Denies any abdominal pain  Elevated troponin: Denies any chest pain.  Severe elevated troponin.  EKG showed some ST depression in the lateral leads.  This could be secondary to type II MI from supply demand ischemia secondary to severe AKI. We have notified cardiology.  Follow-up echocardiogram. Will cycle troponins again.  Chronic combined systolic/diastolic congestive heart failure: Last echocardiogram showed ejection fraction of 30 to 35%, diffuse hypokinesis, grade 1 diastolic dysfunction.  He appears severely dehydrated.  Takes Lasix, ACE/ARB which are on hold.  Insulin-dependent diabetes type 2: Hemoglobin A1c of 7.2 as per May 2 120.  Continue current insulin regimen.  Monitor blood sugars  Hypokalemia: Severely hypokalemic on presentation.  Continue supplementation as needed and monitoring  Hypertension: Currently blood pressure stable.  Continue current medications  Debility/deconditioning: Patient is a skilled nursing facility resident.  Unable to ambulate.  He is overall oriented.  Has severe weakness in bilateral lower extremities.  Looks like he came with Foley catheter from nursing home.  Pressure Injury 06/11/16 healed pressure ulcer on the sacrum.  (Active)  06/11/16 1722  Location: Sacrum  Location Orientation:   Staging:   Wound Description (Comments): healed pressure ulcer on the sacrum.   Present on Admission:      Pressure Injury 06/26/18 Stage III -  Full thickness tissue loss. Subcutaneous fat may be visible but bone, tendon or muscle are NOT exposed. (Active)  06/26/18 1705  Location: Sacrum  Location Orientation: Medial  Staging: Stage III -  Full thickness tissue loss. Subcutaneous fat may be visible but bone, tendon or muscle are NOT exposed.  Wound Description (Comments):   Present on Admission: Yes  Pressure Injury 08/01/18 Other (Comment) Stage II -  Partial thickness loss of dermis presenting as a shallow open ulcer with a red,  pink wound bed without slough. (Active)  08/01/18 0730  Location: Other (Comment) (intergluteal cleft )  Location Orientation:   Staging: Stage II -  Partial thickness loss of dermis presenting as a shallow open ulcer with a red, pink wound bed without slough.  Wound Description (Comments):   Present on Admission: Yes     Pressure Injury 09/08/18 Heel Right Unstageable - Full thickness tissue loss in which the base of the ulcer is covered by slough (yellow, tan, gray, green or brown) and/or eschar (tan, brown or black) in the wound bed. (Active)  09/08/18 0150  Location: Heel  Location Orientation: Right  Staging: Unstageable - Full thickness tissue loss in which the base of the ulcer is covered by slough (yellow, tan, gray, green or brown) and/or eschar (tan, brown or black) in the wound bed.  Wound Description (Comments):   Present on Admission: Yes   Wound care following.              DVT prophylaxis:Heparin [facility] Code Status: Full Family Communication: Called legal guardian, call not received Status is: Inpatient  Remains inpatient appropriate because:Inpatient level of care appropriate due to severity of illness   Dispo: The patient is from: SNF              Anticipated d/c is to: SNF              Patient currently is not medically stable to d/c.   Difficult to place patient No    Consultants: Cardiology  Procedures:None  Antimicrobials:  Anti-infectives (From admission, onward)   None      Subjective:  Patient seen and examined the bedside this morning.  Hemodynamically stable during my evaluation.  He was comfortable.  When I examined him, he was soaked in a pool of loose stool.  He denies any chest pain or abdominal pain or any other complaints.  Objective: Vitals:   06/23/20 0126 06/23/20 0400 06/23/20 0500 06/23/20 0834  BP: 114/63  116/70 121/69  Pulse: 79  80 73  Resp:  11 16 16   Temp: 97.9 F (36.6 C)  (!) 97.5 F (36.4 C) 97.7 F (36.5 C)   TempSrc: Oral  Oral Oral  SpO2: 100%  100% 99%  Weight: 77.3 kg       Intake/Output Summary (Last 24 hours) at 06/23/2020 1143 Last data filed at 06/23/2020 1100 Gross per 24 hour  Intake 205.03 ml  Output 850 ml  Net -644.97 ml   Filed Weights   06/23/20 0126  Weight: 77.3 kg    Examination:  General exam: Appears calm and comfortable ,Not in distress, deconditioned, debilitated HEENT:PERRL,Oral mucosa moist, Ear/Nose normal on gross exam Respiratory system: Bilateral equal air entry, normal vesicular breath sounds, no wheezes or crackles  Cardiovascular system: S1 & S2 heard, RRR. No JVD, murmurs, rubs, gallops or clicks. No pedal edema. Gastrointestinal system: Abdomen is nondistended, soft and nontender. No organomegaly or masses felt. Normal bowel sounds heard. Central nervous system: Alert and awake.  Knows current month, weakness in the lower extremities Extremities: No edema, no clubbing ,no cyanosis Skin: No rashes, lesions or ulcers,no icterus ,no pallor GU: Foley    Data Reviewed: I have personally reviewed following labs and imaging studies  CBC: Recent Labs  Lab 06/23/20 0305  WBC 5.1  HGB 11.0*  HCT 32.4*  MCV  84.6  PLT 119   Basic Metabolic Panel: Recent Labs  Lab 06/23/20 0305  NA 132*  K 2.8*  CL 103  CO2 14*  GLUCOSE 140*  BUN 109*  CREATININE 5.37*  CALCIUM 8.8*  MG 2.6*  PHOS 5.9*   GFR: Estimated Creatinine Clearance: 12.9 mL/min (A) (by C-G formula based on SCr of 5.37 mg/dL (H)). Liver Function Tests: No results for input(s): AST, ALT, ALKPHOS, BILITOT, PROT, ALBUMIN in the last 168 hours. No results for input(s): LIPASE, AMYLASE in the last 168 hours. No results for input(s): AMMONIA in the last 168 hours. Coagulation Profile: No results for input(s): INR, PROTIME in the last 168 hours. Cardiac Enzymes: No results for input(s): CKTOTAL, CKMB, CKMBINDEX, TROPONINI in the last 168 hours. BNP (last 3 results) No results for  input(s): PROBNP in the last 8760 hours. HbA1C: Recent Labs    06/23/20 0305  HGBA1C 6.3*   CBG: Recent Labs  Lab 06/23/20 0836 06/23/20 1122  GLUCAP 117* 119*   Lipid Profile: No results for input(s): CHOL, HDL, LDLCALC, TRIG, CHOLHDL, LDLDIRECT in the last 72 hours. Thyroid Function Tests: No results for input(s): TSH, T4TOTAL, FREET4, T3FREE, THYROIDAB in the last 72 hours. Anemia Panel: No results for input(s): VITAMINB12, FOLATE, FERRITIN, TIBC, IRON, RETICCTPCT in the last 72 hours. Sepsis Labs: No results for input(s): PROCALCITON, LATICACIDVEN in the last 168 hours.  Recent Results (from the past 240 hour(s))  MRSA PCR Screening     Status: None   Collection Time: 06/23/20  3:08 AM   Specimen: Nasopharyngeal  Result Value Ref Range Status   MRSA by PCR NEGATIVE NEGATIVE Final    Comment:        The GeneXpert MRSA Assay (FDA approved for NASAL specimens only), is one component of a comprehensive MRSA colonization surveillance program. It is not intended to diagnose MRSA infection nor to guide or monitor treatment for MRSA infections. Performed at [facility], [place] [address], [place], [place] [address]          Radiology Studies: US RENAL  Result Date: 06/23/2020 CLINICAL DATA:  Acute renal failure on a background of CKD stage 3b EXAM: RENAL / URINARY TRACT ULTRASOUND COMPLETE COMPARISON:  Ultrasound 09/08/2018 FINDINGS: Right Kidney: Renal measurements: 9 x 4.5 x 3.8 cm = volume: 80.6 mL. Increased renal cortical echogenicity. No concerning renal mass, shadowing calculus or hydronephrosis. Partially obscured by adjacent bowel gas. Left Kidney: Renal measurements: 9.1 x 4.8 x 4.2 cm = volume: 98 mL. Increased renal cortical echogenicity. No visible concerning renal mass, shadowing calculus or hydronephrosis. Partially obscured by adjacent bowel gas and rib shadowing. Bladder: Bladder partially decompressed by inflated Foley catheter. Some mild bladder  wall thickening is nonspecific given instrumentation. Other: None. IMPRESSION: 1. Increased renal cortical echogenicity compatible with medical renal disease. 2. No other acute renal abnormalities though both kidneys are partially obscured by adjacent bowel gas. 3. Bladder partially decompressed by Foley catheter. Mild bladder wall thickening is nonspecific given instrumentation. Electronically Signed   By: Lovena Le M.D.   On: 06/23/2020 04:05        Scheduled Meds: . heparin  5,000 Units Subcutaneous Q8H  . insulin aspart  0-6 Units Subcutaneous TID WC   Continuous Infusions: .  sodium bicarbonate (isotonic) infusion in sterile water 125 mL/hr at 06/23/20 0850     LOS: 0 days    Time spent: More than 50% of that time was spent in counseling and/or coordination of care.  Shelly Coss, MD [facility] P4/22/2022, 11:43 AM

## 2020-06-23 NOTE — Progress Notes (Signed)
Seen by Dr Myna Hidalgo, spoke with the pt, assessed pt condition, Dr Myna Hidalgo asked pt if still want to do CPR if pt arrested and still on full code, pt wanted to do everything still, and can call and talk the wife for his admission in the hospital

## 2020-06-23 NOTE — Plan of Care (Signed)

## 2020-06-23 NOTE — Progress Notes (Signed)
Put pt on tele box informed tele dept, started sodium bicarb infusion as ordered, abdominal ultrasound done at bedside

## 2020-06-23 NOTE — Consult Note (Signed)
WOC Nurse Consult Note: Patient receiving care in [place]. Reason for Consult: sacral wound Wound type: Moisture associated skin damage (MASD) - ITD to gluteal cleft Pressure Injury POA: Yes/No/NA Measurement: 5 cm x 1 cm x 0.2 cm Wound bed: fissure is pink, clean Drainage (amount, consistency, odor) none Periwound: multiple, scattered areas of hypopigmentation that are healed. Dressing procedure/placement/frequency: Cut a strip of Xeroform Kellie Simmering [phone]) and place into wound (fissure) in gluteal cleft. Cover with a foam dressing. Change daily.  Monitor the wound area(s) for worsening of condition such as: Signs/symptoms of infection,  Increase in size,  Development of or worsening of odor, Development of pain, or increased pain at the affected locations.  Notify the medical team if any of these develop.  Thank you for the consult.  [facility] nurse will not follow at this time.  Please re-consult the [facility] team if needed.  Val Riles, RN, MSN, CWOCN, CNS-BC, pager [phone]

## 2020-06-24 ENCOUNTER — Inpatient Hospital Stay (HOSPITAL_COMMUNITY): Payer: Medicare Other

## 2020-06-24 DIAGNOSIS — I5042 Chronic combined systolic (congestive) and diastolic (congestive) heart failure: Secondary | ICD-10-CM

## 2020-06-24 DIAGNOSIS — R7989 Other specified abnormal findings of blood chemistry: Secondary | ICD-10-CM | POA: Diagnosis not present

## 2020-06-24 LAB — CBC
HCT: 30 % — ABNORMAL LOW (ref 39.0–52.0)
Hemoglobin: 10.3 g/dL — ABNORMAL LOW (ref 13.0–17.0)
MCH: 29 pg (ref 26.0–34.0)
MCHC: 34.3 g/dL (ref 30.0–36.0)
MCV: 84.5 fL (ref 80.0–100.0)
Platelets: 253 10*3/uL (ref 150–400)
RBC: 3.55 MIL/uL — ABNORMAL LOW (ref 4.22–5.81)
RDW: 15 % (ref 11.5–15.5)
WBC: 5.6 10*3/uL (ref 4.0–10.5)
nRBC: 0 % (ref 0.0–0.2)

## 2020-06-24 LAB — BASIC METABOLIC PANEL
Anion gap: 13 (ref 5–15)
BUN: 95 mg/dL — ABNORMAL HIGH (ref 8–23)
CO2: 18 mmol/L — ABNORMAL LOW (ref 22–32)
Calcium: 8.6 mg/dL — ABNORMAL LOW (ref 8.9–10.3)
Chloride: 103 mmol/L (ref 98–111)
Creatinine, Ser: 4.2 mg/dL — ABNORMAL HIGH (ref 0.61–1.24)
GFR, Estimated: 15 mL/min — ABNORMAL LOW (ref >60)
Glucose, Bld: 92 mg/dL (ref 70–99)
Potassium: 2.8 mmol/L — ABNORMAL LOW (ref 3.5–5.1)
Sodium: 134 mmol/L — ABNORMAL LOW (ref 135–145)

## 2020-06-24 LAB — ECHOCARDIOGRAM COMPLETE
Area-P 1/2: 3.53 cm2
Calc EF: 63.7 %
S' Lateral: 3.5 cm
Single Plane A2C EF: 62.9 %
Single Plane A4C EF: 64.3 %
Weight: 2776.03 oz

## 2020-06-24 LAB — GLUCOSE, CAPILLARY
Glucose-Capillary: 76 mg/dL (ref 70–99)
Glucose-Capillary: 113 mg/dL — ABNORMAL HIGH (ref 70–99)
Glucose-Capillary: 91 mg/dL (ref 70–99)
Glucose-Capillary: 115 mg/dL — ABNORMAL HIGH (ref 70–99)

## 2020-06-24 LAB — PHOSPHORUS: Phosphorus: 3.6 mg/dL (ref 2.5–4.6)

## 2020-06-24 LAB — MAGNESIUM: Magnesium: 2.2 mg/dL (ref 1.7–2.4)

## 2020-06-24 LAB — POTASSIUM: Potassium: 2.5 mmol/L — CL (ref 3.5–5.1)

## 2020-06-24 LAB — UREA NITROGEN, URINE: Urea Nitrogen, Ur: 469 mg/dL

## 2020-06-24 MED ORDER — POTASSIUM CHLORIDE 10 MEQ/100ML IV SOLN
10.0000 meq | INTRAVENOUS | Status: AC
  Administered 2020-06-24 (×6): 10 meq via INTRAVENOUS
  Filled 2020-06-24 (×5): qty 100

## 2020-06-24 MED ORDER — METOPROLOL SUCCINATE ER 25 MG PO TB24
25.0000 mg | ORAL_TABLET | Freq: Every day | ORAL | Status: DC
  Administered 2020-06-24 – 2020-06-28 (×5): 25 mg via ORAL
  Filled 2020-06-24 (×5): qty 1

## 2020-06-24 MED ORDER — SODIUM CHLORIDE 0.9 % IV SOLN
100.0000 mg | Freq: Two times a day (BID) | INTRAVENOUS | Status: DC
  Administered 2020-06-24 – 2020-06-25 (×2): 100 mg via INTRAVENOUS
  Filled 2020-06-24 (×3): qty 100

## 2020-06-24 MED ORDER — PERFLUTREN LIPID MICROSPHERE
1.0000 mL | INTRAVENOUS | Status: AC | PRN
  Administered 2020-06-24: 2 mL via INTRAVENOUS
  Filled 2020-06-24: qty 10

## 2020-06-24 MED ORDER — POTASSIUM CHLORIDE 10 MEQ/100ML IV SOLN
10.0000 meq | INTRAVENOUS | Status: AC
  Administered 2020-06-24 (×6): 10 meq via INTRAVENOUS
  Filled 2020-06-24 (×6): qty 100

## 2020-06-24 MED ORDER — POTASSIUM CHLORIDE CRYS ER 20 MEQ PO TBCR
40.0000 meq | EXTENDED_RELEASE_TABLET | Freq: Once | ORAL | Status: AC
  Administered 2020-06-24: 40 meq via ORAL
  Filled 2020-06-24: qty 2

## 2020-06-24 MED ORDER — POTASSIUM CHLORIDE CRYS ER 20 MEQ PO TBCR
40.0000 meq | EXTENDED_RELEASE_TABLET | Freq: Two times a day (BID) | ORAL | Status: DC
  Administered 2020-06-24 – 2020-06-28 (×8): 40 meq via ORAL
  Filled 2020-06-24 (×8): qty 2

## 2020-06-24 MED ORDER — SERTRALINE HCL 50 MG PO TABS
50.0000 mg | ORAL_TABLET | Freq: Every day | ORAL | Status: DC
  Administered 2020-06-25 – 2020-06-28 (×4): 50 mg via ORAL
  Filled 2020-06-24 (×4): qty 1

## 2020-06-24 NOTE — Progress Notes (Signed)
Progress Note  Patient Name: Frank Hoover Date of Encounter: 06/24/2020  [facility] Cardiologist: Carlyle Dolly, MD   Subjective   No CP or dyspnea  Inpatient Medications    Scheduled Meds: . aspirin  81 mg Oral q morning  . atorvastatin  20 mg Oral QHS  . Chlorhexidine Gluconate Cloth  6 each Topical Daily  . heparin  5,000 Units Subcutaneous Q8H  . insulin aspart  0-6 Units Subcutaneous TID WC  . metoprolol tartrate  12.5 mg Oral BID  . potassium chloride  40 mEq Oral Once   Continuous Infusions: . doxycycline (VIBRAMYCIN) IV    . potassium chloride    .  sodium bicarbonate (isotonic) infusion in sterile water 100 mL/hr at 06/23/20 2115   PRN Meds: acetaminophen **OR** acetaminophen, ondansetron **OR** ondansetron (ZOFRAN) IV, senna-docusate   Vital Signs    Vitals:   06/23/20 2008 06/24/20 0500 06/24/20 0519 06/24/20 0823  BP: 118/70  106/64 123/66  Pulse: 69  61 66  Resp: 18  16 16   Temp: 98.8 F (37.1 C)  98.4 F (36.9 C) 98.3 F (36.8 C)  TempSrc: Oral  Oral Oral  SpO2: 98%  100% 99%  Weight:  78.7 kg      Intake/Output Summary (Last 24 hours) at 06/24/2020 0905 Last data filed at 06/24/2020 0521 Gross per 24 hour  Intake 914.64 ml  Output 600 ml  Net 314.64 ml   Last 3 Weights 06/24/2020 06/23/2020 03/09/2020  Weight (lbs) 173 lb 8 oz 170 lb 6.7 oz 170 lb  Weight (kg) 78.7 kg 77.3 kg 77.111 kg      Telemetry    Sinus with intermittent ventricular pacing, PVCs and nonconducted PACs- Personally Reviewed  Physical Exam   GEN: No acute distress.   Neck: No JVD Cardiac: RRR, no murmurs, rubs, or gallops.  Respiratory: Clear to auscultation bilaterally. GI: Soft, nontender, non-distended  MS: No edema Neuro:  Nonfocal  Psych: Normal affect   Labs    High Sensitivity Troponin:   Recent Labs  Lab 06/23/20 0305 06/23/20 0538 06/23/20 1316 06/23/20 1603  TROPONINIHS 973* 1,106* 887* 1,025*      Chemistry Recent Labs  Lab  06/23/20 1005 06/23/20 1930 06/24/20 0202  NA 135 132* 134*  K 2.6* 2.6* 2.8*  CL 103 102 103  CO2 15* 15* 18*  GLUCOSE 112* 134* 92  BUN 108* 98* 95*  CREATININE 5.13* 4.45* 4.20*  CALCIUM 8.7* 8.4* 8.6*  GFRNONAA 12* 14* 15*  ANIONGAP 17* 15 13     Hematology Recent Labs  Lab 06/23/20 0305 06/24/20 0202  WBC 5.1 5.6  RBC 3.83* 3.55*  HGB 11.0* 10.3*  HCT 32.4* 30.0*  MCV 84.6 84.5  MCH 28.7 29.0  MCHC 34.0 34.3  RDW 15.2 15.0  PLT 291 253    Radiology    US RENAL  Result Date: 06/23/2020 CLINICAL DATA:  Acute renal failure on a background of CKD stage 3b EXAM: RENAL / URINARY TRACT ULTRASOUND COMPLETE COMPARISON:  Ultrasound 09/08/2018 FINDINGS: Right Kidney: Renal measurements: 9 x 4.5 x 3.8 cm = volume: 80.6 mL. Increased renal cortical echogenicity. No concerning renal mass, shadowing calculus or hydronephrosis. Partially obscured by adjacent bowel gas. Left Kidney: Renal measurements: 9.1 x 4.8 x 4.2 cm = volume: 98 mL. Increased renal cortical echogenicity. No visible concerning renal mass, shadowing calculus or hydronephrosis. Partially obscured by adjacent bowel gas and rib shadowing. Bladder: Bladder partially decompressed by inflated Foley catheter. Some mild bladder  wall thickening is nonspecific given instrumentation. Other: None. IMPRESSION: 1. Increased renal cortical echogenicity compatible with medical renal disease. 2. No other acute renal abnormalities though both kidneys are partially obscured by adjacent bowel gas. 3. Bladder partially decompressed by Foley catheter. Mild bladder wall thickening is nonspecific given instrumentation. Electronically Signed   By: Lovena Le M.D.   On: 06/23/2020 04:05    Patient Profile     70 y.o. male with past medical history of chronic combined systolic/diastolic congestive heart failure, previous ICD, atrial fibrillation/flutter, AVNRT ablation, chronic kidney disease, diabetes mellitus, hypertension admitted with  acute on chronic stage IIIb kidney disease for evaluation of elevated troponin.  Most recent echocardiogram January 2018 showed ejection fraction 30 to 40%, grade 1 diastolic dysfunction, mild to moderate left ventricular hypertrophy, mild mitral regurgitation, mild left atrial enlargement, mild to moderate RV dysfunction.  Assessment & Plan    1 elevated troponin-patient has no symptoms including no dyspnea or chest pain.  He was admitted for diarrhea and generalized weakness.  Troponin elevation is in the setting of acute on chronic renal failure.  There is no clear trend.  This is not consistent with acute coronary syndrome and we will not plan for further ischemia evaluation in house.  Echocardiogram pending.  Aspirin and statin have been added.  2 history of paroxysmal atrial fibrillation-patient is in sinus rhythm.  Based on Dr. Tanna Furry last note he has short episodes of atrial fibrillation noted when his device has been interrogated.  He would benefit from anticoagulation unless this is contraindicated.  He will need close follow-up with Dr. Lovena Le for this issue.  We will continue beta-blocker.  3 acute on chronic stage III kidney disease-renal function much worse.  Diuretics are on hold and he is not volume overloaded.  4 cardiomyopathy-etiology unclear.  Apparently has not had ischemia evaluation in the past due to risk of contrast nephropathy.  Can be considered as an outpatient (ie nuclear study).  Will change metoprolol to Toprol 25 mg daily.  No ACE inhibitor, ARB or Entresto due to renal insufficiency.  Can consider addition of hydralazine/nitrates later if blood pressure allows.  Follow-up echocardiogram pending.  5 electrolyte abnormalities-Per primary care.  We will see again on Monday if patient is in the hospital.  Please call prior to then with questions.  For questions or updates, please contact [place] Please consult [email or web address] for contact info under         Signed, Kirk Ruths, MD  06/24/2020, 9:05 AM

## 2020-06-24 NOTE — Progress Notes (Addendum)
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  On presentation he was hemodynamically stable.  Labs showed creatinine of 5.9.  Troponin was elevated.  Potassium was 2.5.  Patient was transferred here for further work-up. Though he does not have any chest pain, troponin remain severely  elevated.  Cardiology consulted , though this could be secondary to severe AKI.  Kidney function slowly improving.  Hospital course notable for severe diarrhea.  GI pathogen panel shows Yersinia enterocolitica  Assessment & Plan:   Principal Problem:   Acute renal failure superimposed on stage 3b chronic kidney disease (HCC) Active Problems:   COPD (chronic obstructive pulmonary disease) (HCC)   HTN (hypertension)   Chronic combined systolic and diastolic CHF (congestive heart failure) (HCC)   Uncontrolled type 2 diabetes mellitus with complication (HCC)   Hypokalemia   Metabolic acidosis   Elevated troponin   AKI on CKD stage IIIb/metabolic acidosis: Baseline creatinine around 1.8.  Severe elevated creatinine on presentation with 5.9.  Serum bicarb was 14. Urine sodium is less than 10 indicating that he is severely dehydrated.  He was having severe diarrhea .  He was also on torsemide at nursing facility which are on hold. Continue current IV fluids.  Continue to monitor kidney function, avoid nephrotoxins.  Renal ultrasound showed Increased renal cortical echogenicity , no obvious obstruction.  He has a chronic indwelling Foley catheter.  Severe diarrhea: This  could have contributed to the dehydration.  C. difficile negative but GI pathogen panel showed  Yersinia enterocolitica.  Abdomen is soft and nontender.  Denies any abdominal pain.  Currently on rectal tube.  Started on doxycycline.  We will follow-up stool culture  Elevated troponin: Denies any chest pain.  Severe elevated troponin.  EKG showed some ST depression in the lateral leads.  This could be secondary to type II MI from supply demand ischemia secondary to severe AKI. Cardiology following, no plan for further intervention.  He has been started on aspirin, statin and metoprolol.  Echo showed improved ejection fraction of 60-65%, no regional wall motion abnormality. Last echocardiogram in 2018 had  shown ejection fraction of 30 to 35%, diffuse hypokinesis, grade 1 diastolic dysfunction.    Insulin-dependent diabetes type 2: Hemoglobin A1c of 6.3 Continue current insulin regimen.  Monitor blood sugars  Hypokalemia: Severely hypokalemic .  Continue supplementation as needed and monitoring  Hypertension: Currently blood pressure stable.  Continue current medications  Debility/deconditioning: Patient is a skilled nursing facility resident.  Unable to ambulate.  He is overall oriented.  Has severe weakness in bilateral lower extremities.  Looks like he came with Foley catheter from nursing home.  Pressure Injury 06/11/16 healed pressure ulcer on the sacrum.  (Active)  06/11/16 1722  Location: Sacrum  Location Orientation:   Staging:   Wound Description (Comments): healed pressure ulcer on the sacrum.   Present on Admission:      Pressure Injury 06/26/18 Stage III -  Full thickness tissue loss. Subcutaneous fat may be visible but bone, tendon or muscle are NOT exposed. (Active)  06/26/18 1705  Location: Sacrum  Location Orientation: Medial  Staging: Stage III -  Full thickness tissue loss. Subcutaneous fat may be visible but bone, tendon or muscle are NOT exposed.  Wound Description  (Comments):   Present on Admission: Yes     Pressure Injury 08/01/18 Other (Comment) Stage II -  Partial thickness loss of dermis presenting as a shallow open ulcer with a red, pink wound bed without slough. (Active)  08/01/18 0730  Location: Other (Comment) (intergluteal cleft )  Location Orientation:   Staging: Stage II -  Partial thickness loss of dermis presenting as a shallow open ulcer with a red, pink wound bed without slough.  Wound Description (Comments):   Present on Admission: Yes     Pressure Injury 09/08/18 Heel Right Unstageable - Full thickness tissue loss in which the base of the ulcer is covered by slough (yellow, tan, gray, green or brown) and/or eschar (tan, brown or black) in the wound bed. (Active)  09/08/18 0150  Location: Heel  Location Orientation: Right  Staging: Unstageable - Full thickness tissue loss in which the base of the ulcer is covered by slough (yellow, tan, gray, green or brown) and/or eschar (tan, brown or black) in the wound bed.  Wound Description (Comments):   Present on Admission: Yes     Pressure Injury 06/23/20 Mid Stage 2 -  Partial thickness loss of dermis presenting as a shallow open injury with a red, pink wound bed without slough. (Active)  06/23/20 2000  Location:   Location Orientation: Mid  Staging: Stage 2 -  Partial thickness loss of dermis presenting as a shallow open injury with a red, pink wound bed without slough.  Wound Description (Comments):   Present on Admission: Yes   Wound care following.              DVT prophylaxis:Heparin [place] Code Status: Full Family Communication: Called legal guardian, call not received Status is: Inpatient  Remains inpatient appropriate because:Inpatient level of care appropriate due to severity of illness   Dispo: The patient is from: SNF              Anticipated d/c is to: SNF              Patient currently is not medically stable to d/c.   Difficult to place patient  No    Consultants: Cardiology  Procedures:None  Antimicrobials:  Anti-infectives (From admission, onward)   Start     Dose/Rate Route Frequency Ordered Stop   06/24/20 1000  doxycycline (VIBRAMYCIN) 100 mg in sodium chloride 0.9 % 250 mL IVPB        100 mg 125 mL/hr over 120 Minutes Intravenous Every 12 hours 06/24/20 0839        Subjective:  Patient seen and examined the bedside this morning.  He was still having diarrhea, currently on rectal tube.  Denies any abdominal pain or any other complaints.  Objective: Vitals:   06/23/20 2008 06/24/20 0500 06/24/20 0519 06/24/20 0823  BP: 118/70  106/64 123/66  Pulse: 69  61 66  Resp: 18  16 16   Temp: 98.8 F (37.1 C)  98.4 F (36.9 C) 98.3 F (36.8 C)  TempSrc: Oral  Oral Oral  SpO2: 98%  100% 99%  Weight:  78.7 kg      Intake/Output Summary (Last 24 hours) at 06/24/2020 1126 Last data filed at 06/24/2020 1100 Gross per 24 hour  Intake 1154.64 ml  Output 200 ml  Net 954.64 ml   Filed Weights   06/23/20 0126 06/24/20  0500  Weight: 77.3 kg 78.7 kg    Examination:  General exam: Overall comfortable, not in distress, deconditioned, debilitated HEENT: PERRL Respiratory system:  no wheezes or crackles  Cardiovascular system: S1 & S2 heard, RRR.  Gastrointestinal system: Abdomen is nondistended, soft and nontender. Central nervous system: Alert and mostly oriented Extremities: No edema, no clubbing ,no cyanosis Skin: No rashes, no ulcers,no icterus GU: Rectal tube, Foley   Data Reviewed: I have personally reviewed following labs and imaging studies  CBC: Recent Labs  Lab 06/23/20 0305 06/24/20 0202  WBC 5.1 5.6  HGB 11.0* 10.3*  HCT 32.4* 30.0*  MCV 84.6 84.5  PLT 291 485   Basic Metabolic Panel: Recent Labs  Lab 06/23/20 0305 06/23/20 1005 06/23/20 1930 06/23/20 2113 06/24/20 0202  NA 132* 135 132*  --  134*  K 2.8* 2.6* 2.6*  --  2.8*  CL 103 103 102  --  103  CO2 14* 15* 15*  --  18*  GLUCOSE  140* 112* 134*  --  92  BUN 109* 108* 98*  --  95*  CREATININE 5.37* 5.13* 4.45*  --  4.20*  CALCIUM 8.8* 8.7* 8.4*  --  8.6*  MG 2.6*  --   --  2.4 2.2  PHOS 5.9*  --   --   --  3.6   GFR: Estimated Creatinine Clearance: 16.6 mL/min (A) (by C-G formula based on SCr of 4.2 mg/dL (H)). Liver Function Tests: No results for input(s): AST, ALT, ALKPHOS, BILITOT, PROT, ALBUMIN in the last 168 hours. No results for input(s): LIPASE, AMYLASE in the last 168 hours. No results for input(s): AMMONIA in the last 168 hours. Coagulation Profile: No results for input(s): INR, PROTIME in the last 168 hours. Cardiac Enzymes: No results for input(s): CKTOTAL, CKMB, CKMBINDEX, TROPONINI in the last 168 hours. BNP (last 3 results) No results for input(s): PROBNP in the last 8760 hours. HbA1C: Recent Labs    06/23/20 0305  HGBA1C 6.3*   CBG: Recent Labs  Lab 06/23/20 0836 06/23/20 1122 06/23/20 1648 06/23/20 2143 06/24/20 0645  GLUCAP 117* 119* 148* 130* 76   Lipid Profile: No results for input(s): CHOL, HDL, LDLCALC, TRIG, CHOLHDL, LDLDIRECT in the last 72 hours. Thyroid Function Tests: No results for input(s): TSH, T4TOTAL, FREET4, T3FREE, THYROIDAB in the last 72 hours. Anemia Panel: No results for input(s): VITAMINB12, FOLATE, FERRITIN, TIBC, IRON, RETICCTPCT in the last 72 hours. Sepsis Labs: No results for input(s): PROCALCITON, LATICACIDVEN in the last 168 hours.  Recent Results (from the past 240 hour(s))  MRSA PCR Screening     Status: None   Collection Time: 06/23/20  3:08 AM   Specimen: Nasopharyngeal  Result Value Ref Range Status   MRSA by PCR NEGATIVE NEGATIVE Final    Comment:        The GeneXpert MRSA Assay (FDA approved for NASAL specimens only), is one component of a comprehensive MRSA colonization surveillance program. It is not intended to diagnose MRSA infection nor to guide or monitor treatment for MRSA infections. Performed at [facility],  [place] [address], [place], [place] [address]   Gastrointestinal Panel by PCR , Stool     Status: Abnormal   Collection Time: 06/23/20  9:29 AM   Specimen: Stool  Result Value Ref Range Status   Campylobacter species NOT DETECTED NOT DETECTED Final   Plesimonas shigelloides NOT DETECTED NOT DETECTED Final   Salmonella species NOT DETECTED NOT DETECTED Final   Yersinia enterocolitica  DETECTED (A) NOT DETECTED Final    Comment: RESULT CALLED TO, READ BACK BY AND VERIFIED WITH: VICTOR AGBAI @1732  ON 06/23/20 SKL    Vibrio species NOT DETECTED NOT DETECTED Final   Vibrio cholerae NOT DETECTED NOT DETECTED Final   Enteroaggregative E coli (EAEC) NOT DETECTED NOT DETECTED Final   Enteropathogenic E coli (EPEC) NOT DETECTED NOT DETECTED Final   Enterotoxigenic E coli (ETEC) NOT DETECTED NOT DETECTED Final   Shiga like toxin producing E coli (STEC) NOT DETECTED NOT DETECTED Final   Shigella/Enteroinvasive E coli (EIEC) NOT DETECTED NOT DETECTED Final   Cryptosporidium NOT DETECTED NOT DETECTED Final   Cyclospora cayetanensis NOT DETECTED NOT DETECTED Final   Entamoeba histolytica NOT DETECTED NOT DETECTED Final   Giardia lamblia NOT DETECTED NOT DETECTED Final   Adenovirus F40/41 NOT DETECTED NOT DETECTED Final   Astrovirus NOT DETECTED NOT DETECTED Final   Norovirus GI/GII NOT DETECTED NOT DETECTED Final   Rotavirus A NOT DETECTED NOT DETECTED Final   Sapovirus (I, II, IV, and V) NOT DETECTED NOT DETECTED Final    Comment: Performed at [facility], [address] [place], [place] [address]  C Difficile Quick Screen w PCR reflex     Status: None   Collection Time: 06/23/20  9:38 AM   Specimen: STOOL  Result Value Ref Range Status   C Diff antigen NEGATIVE NEGATIVE Final   C Diff toxin NEGATIVE NEGATIVE Final   C Diff interpretation No C. difficile detected.  Final    Comment: Performed at [facility], [place] [address], [place], [place] [address]         Radiology  Studies: US RENAL  Result Date: 06/23/2020 CLINICAL DATA:  Acute renal failure on a background of CKD stage 3b EXAM: RENAL / URINARY TRACT ULTRASOUND COMPLETE COMPARISON:  Ultrasound 09/08/2018 FINDINGS: Right Kidney: Renal measurements: 9 x 4.5 x 3.8 cm = volume: 80.6 mL. Increased renal cortical echogenicity. No concerning renal mass, shadowing calculus or hydronephrosis. Partially obscured by adjacent bowel gas. Left Kidney: Renal measurements: 9.1 x 4.8 x 4.2 cm = volume: 98 mL. Increased renal cortical echogenicity. No visible concerning renal mass, shadowing calculus or hydronephrosis. Partially obscured by adjacent bowel gas and rib shadowing. Bladder: Bladder partially decompressed by inflated Foley catheter. Some mild bladder wall thickening is nonspecific given instrumentation. Other: None. IMPRESSION: 1. Increased renal cortical echogenicity compatible with medical renal disease. 2. No other acute renal abnormalities though both kidneys are partially obscured by adjacent bowel gas. 3. Bladder partially decompressed by Foley catheter. Mild bladder wall thickening is nonspecific given instrumentation. Electronically Signed   By: Lovena Le M.D.   On: 06/23/2020 04:05   ECHOCARDIOGRAM COMPLETE  Result Date: 06/24/2020    ECHOCARDIOGRAM REPORT   Patient Name:   HAYZE GAZDA Date of Exam: 06/24/2020 Medical Rec #:  [id number]        Height:       66.0 in Accession #:    [id number]       Weight:       173.5 lb Date of Birth:  06/13/1950       BSA:          1.883 m Patient Age:    56 years         BP:           123/66 mmHg Patient Gender: M                HR:  66 bpm. Exam Location:  Inpatient Procedure: 2D Echo, Cardiac Doppler, Color Doppler and Intracardiac            Opacification Agent Indications:    Elevated Troponin  History:        Patient has prior history of Echocardiogram examinations, most                 recent 04/01/2016. Medical history of chronic combined                  systolic/diastolic congestive heart failure, previous ICD,                 atrial fibrillation/flutter, AVNRT ablation, chronic kidney                 disease, diabetes mellitus, hypertension admitted with acute on                 chronic stage IIIb kidney disease for evaluation of elevated                 troponin.  Sonographer:    Darlina Sicilian RDCS Referring Phys: [id number] [place]  1. Left ventricular ejection fraction, by estimation, is 60 to 65%. The left ventricle has normal function. The left ventricle has no regional wall motion abnormalities. There is mild left ventricular hypertrophy. Left ventricular diastolic parameters are indeterminate.  2. Right ventricular systolic function is mildly reduced. The right ventricular size is normal. Tricuspid regurgitation signal is inadequate for assessing PA pressure.  3. The mitral valve is normal in structure. No evidence of mitral valve regurgitation. No evidence of mitral stenosis.  4. The aortic valve is tricuspid. Aortic valve regurgitation is not visualized. Mild aortic valve sclerosis is present, with no evidence of aortic valve stenosis. FINDINGS  Left Ventricle: Left ventricular ejection fraction, by estimation, is 60 to 65%. The left ventricle has normal function. The left ventricle has no regional wall motion abnormalities. Definity contrast agent was given IV to delineate the left ventricular  endocardial borders. The left ventricular internal cavity size was normal in size. There is mild left ventricular hypertrophy. Left ventricular diastolic parameters are indeterminate. Right Ventricle: The right ventricular size is normal. No increase in right ventricular wall thickness. Right ventricular systolic function is mildly reduced. Tricuspid regurgitation signal is inadequate for assessing PA pressure. Left Atrium: Left atrial size was normal in size. Right Atrium: Right atrial size was normal in size. Pericardium: There is no evidence of  pericardial effusion. Mitral Valve: The mitral valve is normal in structure. No evidence of mitral valve regurgitation. No evidence of mitral valve stenosis. Tricuspid Valve: The tricuspid valve is normal in structure. Tricuspid valve regurgitation is not demonstrated. Aortic Valve: The aortic valve is tricuspid. Aortic valve regurgitation is not visualized. Mild aortic valve sclerosis is present, with no evidence of aortic valve stenosis. Pulmonic Valve: The pulmonic valve was not well visualized. Pulmonic valve regurgitation is not visualized. Aorta: The aortic root is normal in size and structure. IAS/Shunts: The interatrial septum was not well visualized.  LEFT VENTRICLE PLAX 2D LVIDd:         5.20 cm      Diastology LVIDs:         3.50 cm      LV e' medial:    2.94 cm/s LV PW:         1.10 cm      LV E/e' medial:  16.1 LV IVS:  1.00 cm      LV e' lateral:   5.87 cm/s LVOT diam:     1.90 cm      LV E/e' lateral: 8.1 LV SV:         35 LV SV Index:   18 LVOT Area:     2.84 cm  LV Volumes (MOD) LV vol d, MOD A2C: 144.0 ml LV vol d, MOD A4C: 129.0 ml LV vol s, MOD A2C: 53.4 ml LV vol s, MOD A4C: 46.1 ml LV SV MOD A2C:     90.6 ml LV SV MOD A4C:     129.0 ml LV SV MOD BP:      90.1 ml RIGHT VENTRICLE RV S prime:     7.18 cm/s TAPSE (M-mode): 1.4 cm LEFT ATRIUM             Index       RIGHT ATRIUM          Index LA diam:        3.90 cm 2.07 cm/m  RA Area:     8.45 cm LA Vol (A2C):   38.2 ml 20.29 ml/m RA Volume:   14.00 ml 7.44 ml/m LA Vol (A4C):   28.4 ml 15.09 ml/m LA Biplane Vol: 33.1 ml 17.58 ml/m  AORTIC VALVE LVOT Vmax:   80.90 cm/s LVOT Vmean:  50.900 cm/s LVOT VTI:    0.122 m  AORTA Ao Root diam: 3.20 cm MITRAL VALVE MV Area (PHT): 3.53 cm    SHUNTS MV Decel Time: 215 msec    Systemic VTI:  0.12 m MV E velocity: 47.30 cm/s  Systemic Diam: 1.90 cm MV A velocity: 55.50 cm/s MV E/A ratio:  0.85 Oswaldo Milian MD Electronically signed by Oswaldo Milian MD Signature Date/Time:  06/24/2020/11:20:35 AM    Final         Scheduled Meds: . aspirin  81 mg Oral q morning  . atorvastatin  20 mg Oral QHS  . Chlorhexidine Gluconate Cloth  6 each Topical Daily  . heparin  5,000 Units Subcutaneous Q8H  . insulin aspart  0-6 Units Subcutaneous TID WC  . metoprolol succinate  25 mg Oral Daily   Continuous Infusions: . doxycycline (VIBRAMYCIN) IV    . potassium chloride 10 mEq (06/24/20 1050)  .  sodium bicarbonate (isotonic) infusion in sterile water 100 mL/hr at 06/23/20 2115     LOS: 1 day    Time spent: 35 mins.More than 50% of that time was spent in counseling and/or coordination of care.      Shelly Coss, MD [facility] P4/23/2022, 11:26 AM

## 2020-06-24 NOTE — Progress Notes (Signed)
Dr. Tawanna Solo notified about pt's potassium level of 2.5.

## 2020-06-24 NOTE — Progress Notes (Signed)
Date and time results received: 06/24/20  2045  Test: K  Critical Value: 2.6  Name of Provider Notified: X.Blount  Orders Received? Or Actions Taken?: Potassium chloride 10 mEq IV every 1 hr x 6

## 2020-06-24 NOTE — Plan of Care (Signed)
  Problem: Safety: Goal: Ability to remain free from injury will improve Outcome: Progressing   

## 2020-06-24 NOTE — Plan of Care (Signed)
  Problem: Education: Goal: Knowledge of General Education information will improve Description: Including pain rating scale, medication(s)/side effects and non-pharmacologic comfort measures Outcome: Progressing   Problem: Health Behavior/Discharge Planning: Goal: Ability to manage health-related needs will improve Outcome: Progressing   Problem: Nutrition: Goal: Adequate nutrition will be maintained Outcome: Progressing   Problem: Coping: Goal: Level of anxiety will decrease Outcome: Progressing   Problem: Safety: Goal: Ability to remain free from injury will improve Outcome: Progressing   Problem: Skin Integrity: Goal: Risk for impaired skin integrity will decrease Outcome: Progressing

## 2020-06-24 NOTE — Progress Notes (Signed)
\   Echocardiogram 2D Echocardiogram with definity has been performed.  Darlina Sicilian M 06/24/2020, 10:55 AM

## 2020-06-24 NOTE — Progress Notes (Signed)
Patient with severe diarrhea. Requested rectal tube from [facility] on call. Flexi seal was ordered and inserted. Watery, dark green stools immediately drained through the tube. Pt tolerated well. Will continue to monitor.

## 2020-06-25 LAB — CBC
HCT: 27.3 % — ABNORMAL LOW (ref 39.0–52.0)
Hemoglobin: 9.6 g/dL — ABNORMAL LOW (ref 13.0–17.0)
MCH: 29.8 pg (ref 26.0–34.0)
MCHC: 35.2 g/dL (ref 30.0–36.0)
MCV: 84.8 fL (ref 80.0–100.0)
Platelets: 218 10*3/uL (ref 150–400)
RBC: 3.22 MIL/uL — ABNORMAL LOW (ref 4.22–5.81)
RDW: 15 % (ref 11.5–15.5)
WBC: 7.2 10*3/uL (ref 4.0–10.5)
nRBC: 0 % (ref 0.0–0.2)

## 2020-06-25 LAB — POTASSIUM: Potassium: 3.9 mmol/L (ref 3.5–5.1)

## 2020-06-25 LAB — BASIC METABOLIC PANEL
Anion gap: 10 (ref 5–15)
BUN: 78 mg/dL — ABNORMAL HIGH (ref 8–23)
CO2: 19 mmol/L — ABNORMAL LOW (ref 22–32)
Calcium: 8.5 mg/dL — ABNORMAL LOW (ref 8.9–10.3)
Chloride: 104 mmol/L (ref 98–111)
Creatinine, Ser: 3.25 mg/dL — ABNORMAL HIGH (ref 0.61–1.24)
GFR, Estimated: 20 mL/min — ABNORMAL LOW (ref >60)
Glucose, Bld: 112 mg/dL — ABNORMAL HIGH (ref 70–99)
Potassium: 2.9 mmol/L — ABNORMAL LOW (ref 3.5–5.1)
Sodium: 133 mmol/L — ABNORMAL LOW (ref 135–145)

## 2020-06-25 LAB — GLUCOSE, CAPILLARY
Glucose-Capillary: 100 mg/dL — ABNORMAL HIGH (ref 70–99)
Glucose-Capillary: 156 mg/dL — ABNORMAL HIGH (ref 70–99)
Glucose-Capillary: 123 mg/dL — ABNORMAL HIGH (ref 70–99)
Glucose-Capillary: 161 mg/dL — ABNORMAL HIGH (ref 70–99)

## 2020-06-25 MED ORDER — CIPROFLOXACIN IN D5W 400 MG/200ML IV SOLN
400.0000 mg | INTRAVENOUS | Status: AC
  Administered 2020-06-25 – 2020-06-27 (×3): 400 mg via INTRAVENOUS
  Filled 2020-06-25 (×3): qty 200

## 2020-06-25 MED ORDER — POTASSIUM CHLORIDE 10 MEQ/100ML IV SOLN
10.0000 meq | INTRAVENOUS | Status: AC
  Administered 2020-06-25 (×6): 10 meq via INTRAVENOUS
  Filled 2020-06-25 (×6): qty 100

## 2020-06-25 NOTE — Progress Notes (Signed)
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  On presentation he was hemodynamically stable.  Labs showed creatinine of 5.9.  Troponin was elevated.  Potassium was 2.5.  Patient was transferred here for further work-up. Though he does not have any chest pain, troponin remain severely  elevated.  Cardiology consulted , though this could be secondary to severe AKI.  Kidney function slowly improving.  Hospital course notable for severe diarrhea.  GI pathogen panel showed Yersinia enterocolitica  Assessment & Plan:   Principal Problem:   Acute renal failure superimposed on stage 3b chronic kidney disease (HCC) Active Problems:   COPD (chronic obstructive pulmonary disease) (HCC)   HTN (hypertension)   Chronic combined systolic and diastolic CHF (congestive heart failure) (HCC)   Uncontrolled type 2 diabetes mellitus with complication (HCC)   Hypokalemia   Metabolic acidosis   Elevated troponin   AKI on CKD stage IIIb/metabolic acidosis: Baseline creatinine around 1.8.  Severe elevated creatinine on presentation with 5.9.  Serum bicarb was 14. Urine sodium is less than 10 indicating that he is severely dehydrated.  He was having severe diarrhea .  He was also on torsemide at nursing facility which are on hold. Continue current IV fluids.  Continue to monitor kidney function, avoid nephrotoxins.  Renal ultrasound showed Increased renal cortical echogenicity , no obvious obstruction.  He has a chronic indwelling Foley catheter.  Severe diarrhea: This  could have contributed to the dehydration.  C. difficile negative but GI pathogen panel showed  Yersinia enterocolitica.  Abdomen is soft and nontender.  Denies any abdominal pain.  Currently on rectal tube.  Started on cipro, plan for 3 to 5 days course.  We will follow-up stool culture  Elevated troponin: Denies any chest pain.  Severe elevated troponin.  EKG showed some ST depression in the lateral leads.  This could be secondary to type II MI from supply demand ischemia secondary to severe AKI. Cardiology following, no plan for further intervention.  He has been started on aspirin, statin and metoprolol.  Echo showed improved ejection fraction of 60-65%, no regional wall motion abnormality. Last echocardiogram in 2018 had  shown ejection fraction of 30 to 35%, diffuse hypokinesis, grade 1 diastolic dysfunction.    Insulin-dependent diabetes type 2: Hemoglobin A1c of 6.3 Continue current insulin regimen.  Monitor blood sugars  Hypokalemia: Severely hypokalemic . Mg ok. Continue supplementation as needed and monitoring  Hypertension: Currently blood pressure stable.  Continue current medications  Debility/deconditioning: Patient is a skilled nursing facility resident.  Unable to ambulate.  He is overall oriented.  Has severe weakness in bilateral lower extremities.  Looks like he came with Foley catheter from nursing home.   Pressure Injury 06/11/16 healed pressure ulcer on the sacrum.  (Active)  06/11/16 1722  Location: Sacrum  Location Orientation:   Staging:   Wound Description (Comments): healed pressure ulcer on the sacrum.   Present on Admission:      Pressure Injury 06/26/18 Stage III -  Full thickness tissue loss. Subcutaneous fat may be visible but bone, tendon or muscle are NOT exposed. (Active)  06/26/18  1705  Location: Sacrum  Location Orientation: Medial  Staging: Stage III -  Full thickness tissue loss. Subcutaneous fat may be visible but bone, tendon or muscle are NOT  exposed.  Wound Description (Comments):   Present on Admission: Yes     Pressure Injury 08/01/18 Other (Comment) Stage II -  Partial thickness loss of dermis presenting as a shallow open ulcer with a red, pink wound bed without slough. (Active)  08/01/18 0730  Location: Other (Comment) (intergluteal cleft )  Location Orientation:   Staging: Stage II -  Partial thickness loss of dermis presenting as a shallow open ulcer with a red, pink wound bed without slough.  Wound Description (Comments):   Present on Admission: Yes     Pressure Injury 09/08/18 Heel Right Unstageable - Full thickness tissue loss in which the base of the ulcer is covered by slough (yellow, tan, gray, green or brown) and/or eschar (tan, brown or black) in the wound bed. (Active)  09/08/18 0150  Location: Heel  Location Orientation: Right  Staging: Unstageable - Full thickness tissue loss in which the base of the ulcer is covered by slough (yellow, tan, gray, green or brown) and/or eschar (tan, brown or black) in the wound bed.  Wound Description (Comments):   Present on Admission: Yes     Pressure Injury 06/23/20 Mid Stage 2 -  Partial thickness loss of dermis presenting as a shallow open injury with a red, pink wound bed without slough. (Active)  06/23/20 2000  Location:   Location Orientation: Mid  Staging: Stage 2 -  Partial thickness loss of dermis presenting as a shallow open injury with a red, pink wound bed without slough.  Wound Description (Comments):   Present on Admission: Yes   Wound care following.              DVT prophylaxis:Heparin Finger Code Status: Full Family Communication: Called legal guardian and his spouse on their  numbers, calls not  received  Status is: Inpatient  Remains inpatient appropriate because:Inpatient level of care appropriate due to severity of illness   Dispo: The patient is from: SNF              Anticipated d/c is to: SNF              Patient currently is not  medically stable to d/c.   Difficult to place patient No    Consultants: Cardiology  Procedures:None  Antimicrobials:  Anti-infectives (From admission, onward)   Start     Dose/Rate Route Frequency Ordered Stop   06/25/20 1000  ciprofloxacin (CIPRO) IVPB 400 mg        400 mg 200 mL/hr over 60 Minutes Intravenous Every 24 hours 06/25/20 0747     06/24/20 1000  doxycycline (VIBRAMYCIN) 100 mg in sodium chloride 0.9 % 250 mL IVPB  Status:  Discontinued        100 mg 125 mL/hr over 120 Minutes Intravenous Every 12 hours 06/24/20 0839 06/25/20 0733      Subjective:  Patient seen and examined at the bedside this morning.  Hemodynamically stable.  Looks more alert and comfortable today.  Denies any complaints.  Still having diarrhea.  Objective: Vitals:   06/24/20 2049 06/25/20 0441 06/25/20 0441 06/25/20 0801  BP: 128/73  132/67 140/74  Pulse: 69  66 75  Resp: 16  16   Temp: 98.7 F (37.1 C)  98.6 F (37 C) 98.3 F (36.8 C)  TempSrc: Oral  Oral Oral  SpO2: 100%  100% 100%  Weight:  80.8 kg      Intake/Output Summary (Last 24 hours) at 06/25/2020 0831 Last data filed at 06/25/2020 0800 Gross per 24 hour  Intake 3128.62 ml  Output 200 ml  Net 2928.62 ml   Filed Weights   06/23/20 0126 06/24/20 0500 06/25/20 0441  Weight: 77.3 kg 78.7 kg 80.8 kg    Examination:  General exam: Overall comfortable, not in distress, deconditioned, debilitated HEENT: PERRL Respiratory system:  no wheezes or crackles  Cardiovascular system: S1 & S2 heard, RRR.  Gastrointestinal system: Abdomen is nondistended, soft and nontender. Central nervous system: Alert and mostly oriented Extremities: No edema, no clubbing ,no cyanosis Skin: No rashes, no ulcers,no icterus GU: Rectal tube, Foley   Data Reviewed: I have personally reviewed following labs and imaging studies  CBC: Recent Labs  Lab 06/23/20 0305 06/24/20 0202 06/25/20 0221  WBC 5.1 5.6 7.2  HGB 11.0* 10.3* 9.6*  HCT  32.4* 30.0* 27.3*  MCV 84.6 84.5 84.8  PLT 291 253 300   Basic Metabolic Panel: Recent Labs  Lab 06/23/20 0305 06/23/20 1005 06/23/20 1930 06/23/20 2113 06/24/20 0202 06/24/20 1419 06/25/20 0221  NA 132* 135 132*  --  134*  --  133*  K 2.8* 2.6* 2.6*  --  2.8* 2.5* 2.9*  CL 103 103 102  --  103  --  104  CO2 14* 15* 15*  --  18*  --  19*  GLUCOSE 140* 112* 134*  --  92  --  112*  BUN 109* 108* 98*  --  95*  --  78*  CREATININE 5.37* 5.13* 4.45*  --  4.20*  --  3.25*  CALCIUM 8.8* 8.7* 8.4*  --  8.6*  --  8.5*  MG 2.6*  --   --  2.4 2.2  --   --   PHOS 5.9*  --   --   --  3.6  --   --    GFR: Estimated Creatinine Clearance: 21.7 mL/min (A) (by C-G formula based on SCr of 3.25 mg/dL (H)). Liver Function Tests: No results for input(s): AST, ALT, ALKPHOS, BILITOT, PROT, ALBUMIN in the last 168 hours. No results for input(s): LIPASE, AMYLASE in the last 168 hours. No results for input(s): AMMONIA in the last 168 hours. Coagulation Profile: No results for input(s): INR, PROTIME in the last 168 hours. Cardiac Enzymes: No results for input(s): CKTOTAL, CKMB, CKMBINDEX, TROPONINI in the last 168 hours. BNP (last 3 results) No results for input(s): PROBNP in the last 8760 hours. HbA1C: Recent Labs    06/23/20 0305  HGBA1C 6.3*   CBG: Recent Labs  Lab 06/24/20 0645 06/24/20 1136 06/24/20 1613 06/24/20 2235 06/25/20 0657  GLUCAP 76 113* 91 115* 100*   Lipid Profile: No results for input(s): CHOL, HDL, LDLCALC, TRIG, CHOLHDL, LDLDIRECT in the last 72 hours. Thyroid Function Tests: No results for input(s): TSH, T4TOTAL, FREET4, T3FREE, THYROIDAB in the last 72 hours. Anemia Panel: No results for input(s): VITAMINB12, FOLATE, FERRITIN, TIBC, IRON, RETICCTPCT in the last 72 hours. Sepsis Labs: No results for input(s): PROCALCITON, LATICACIDVEN in the last 168 hours.  Recent Results (from the past 240 hour(s))  MRSA PCR Screening     Status: None   Collection Time:  06/23/20  3:08 AM   Specimen: Nasopharyngeal  Result Value Ref Range Status   MRSA by PCR NEGATIVE NEGATIVE Final    Comment:        The GeneXpert  MRSA Assay (FDA approved for NASAL specimens only), is one component of a comprehensive MRSA colonization surveillance program. It is not intended to diagnose MRSA infection nor to guide or monitor treatment for MRSA infections. Performed at [facility], [place] [address], [place], [place] [address]   Gastrointestinal Panel by PCR , Stool     Status: Abnormal   Collection Time: 06/23/20  9:29 AM   Specimen: Stool  Result Value Ref Range Status   Campylobacter species NOT DETECTED NOT DETECTED Final   Plesimonas shigelloides NOT DETECTED NOT DETECTED Final   Salmonella species NOT DETECTED NOT DETECTED Final   Yersinia enterocolitica DETECTED (A) NOT DETECTED Final    Comment: RESULT CALLED TO, READ BACK BY AND VERIFIED WITH: VICTOR AGBAI @1732  ON 06/23/20 SKL    Vibrio species NOT DETECTED NOT DETECTED Final   Vibrio cholerae NOT DETECTED NOT DETECTED Final   Enteroaggregative E coli (EAEC) NOT DETECTED NOT DETECTED Final   Enteropathogenic E coli (EPEC) NOT DETECTED NOT DETECTED Final   Enterotoxigenic E coli (ETEC) NOT DETECTED NOT DETECTED Final   Shiga like toxin producing E coli (STEC) NOT DETECTED NOT DETECTED Final   Shigella/Enteroinvasive E coli (EIEC) NOT DETECTED NOT DETECTED Final   Cryptosporidium NOT DETECTED NOT DETECTED Final   Cyclospora cayetanensis NOT DETECTED NOT DETECTED Final   Entamoeba histolytica NOT DETECTED NOT DETECTED Final   Giardia lamblia NOT DETECTED NOT DETECTED Final   Adenovirus F40/41 NOT DETECTED NOT DETECTED Final   Astrovirus NOT DETECTED NOT DETECTED Final   Norovirus GI/GII NOT DETECTED NOT DETECTED Final   Rotavirus A NOT DETECTED NOT DETECTED Final   Sapovirus (I, II, IV, and V) NOT DETECTED NOT DETECTED Final    Comment: Performed at [facility], [address]  [place], [place] [address]  C Difficile Quick Screen w PCR reflex     Status: None   Collection Time: 06/23/20  9:38 AM   Specimen: STOOL  Result Value Ref Range Status   C Diff antigen NEGATIVE NEGATIVE Final   C Diff toxin NEGATIVE NEGATIVE Final   C Diff interpretation No C. difficile detected.  Final    Comment: Performed at [facility], [place] [address], [place], [place] [address]         Radiology Studies: ECHOCARDIOGRAM COMPLETE  Result Date: 06/24/2020    ECHOCARDIOGRAM REPORT   Patient Name:   VALEN GILLISON Date of Exam: 06/24/2020 Medical Rec #:  [id number]        Height:       66.0 in Accession #:    [id number]       Weight:       173.5 lb Date of Birth:  Jun 06, 1950       BSA:          1.883 m Patient Age:    40 years         BP:           123/66 mmHg Patient Gender: M                HR:           66 bpm. Exam Location:  Inpatient Procedure: 2D Echo, Cardiac Doppler, Color Doppler and Intracardiac            Opacification Agent Indications:    Elevated Troponin  History:        Patient has prior history of Echocardiogram examinations, most  recent 04/01/2016. Medical history of chronic combined                 systolic/diastolic congestive heart failure, previous ICD,                 atrial fibrillation/flutter, AVNRT ablation, chronic kidney                 disease, diabetes mellitus, hypertension admitted with acute on                 chronic stage IIIb kidney disease for evaluation of elevated                 troponin.  Sonographer:    Darlina Sicilian RDCS Referring Phys: [id number] [place]  1. Left ventricular ejection fraction, by estimation, is 60 to 65%. The left ventricle has normal function. The left ventricle has no regional wall motion abnormalities. There is mild left ventricular hypertrophy. Left ventricular diastolic parameters are indeterminate.  2. Right ventricular systolic function is mildly reduced. The right ventricular size is normal.  Tricuspid regurgitation signal is inadequate for assessing PA pressure.  3. The mitral valve is normal in structure. No evidence of mitral valve regurgitation. No evidence of mitral stenosis.  4. The aortic valve is tricuspid. Aortic valve regurgitation is not visualized. Mild aortic valve sclerosis is present, with no evidence of aortic valve stenosis. FINDINGS  Left Ventricle: Left ventricular ejection fraction, by estimation, is 60 to 65%. The left ventricle has normal function. The left ventricle has no regional wall motion abnormalities. Definity contrast agent was given IV to delineate the left ventricular  endocardial borders. The left ventricular internal cavity size was normal in size. There is mild left ventricular hypertrophy. Left ventricular diastolic parameters are indeterminate. Right Ventricle: The right ventricular size is normal. No increase in right ventricular wall thickness. Right ventricular systolic function is mildly reduced. Tricuspid regurgitation signal is inadequate for assessing PA pressure. Left Atrium: Left atrial size was normal in size. Right Atrium: Right atrial size was normal in size. Pericardium: There is no evidence of pericardial effusion. Mitral Valve: The mitral valve is normal in structure. No evidence of mitral valve regurgitation. No evidence of mitral valve stenosis. Tricuspid Valve: The tricuspid valve is normal in structure. Tricuspid valve regurgitation is not demonstrated. Aortic Valve: The aortic valve is tricuspid. Aortic valve regurgitation is not visualized. Mild aortic valve sclerosis is present, with no evidence of aortic valve stenosis. Pulmonic Valve: The pulmonic valve was not well visualized. Pulmonic valve regurgitation is not visualized. Aorta: The aortic root is normal in size and structure. IAS/Shunts: The interatrial septum was not well visualized.  LEFT VENTRICLE PLAX 2D LVIDd:         5.20 cm      Diastology LVIDs:         3.50 cm      LV e' medial:     2.94 cm/s LV PW:         1.10 cm      LV E/e' medial:  16.1 LV IVS:        1.00 cm      LV e' lateral:   5.87 cm/s LVOT diam:     1.90 cm      LV E/e' lateral: 8.1 LV SV:         35 LV SV Index:   18 LVOT Area:     2.84 cm  LV Volumes (MOD) LV vol d,  MOD A2C: 144.0 ml LV vol d, MOD A4C: 129.0 ml LV vol s, MOD A2C: 53.4 ml LV vol s, MOD A4C: 46.1 ml LV SV MOD A2C:     90.6 ml LV SV MOD A4C:     129.0 ml LV SV MOD BP:      90.1 ml RIGHT VENTRICLE RV S prime:     7.18 cm/s TAPSE (M-mode): 1.4 cm LEFT ATRIUM             Index       RIGHT ATRIUM          Index LA diam:        3.90 cm 2.07 cm/m  RA Area:     8.45 cm LA Vol (A2C):   38.2 ml 20.29 ml/m RA Volume:   14.00 ml 7.44 ml/m LA Vol (A4C):   28.4 ml 15.09 ml/m LA Biplane Vol: 33.1 ml 17.58 ml/m  AORTIC VALVE LVOT Vmax:   80.90 cm/s LVOT Vmean:  50.900 cm/s LVOT VTI:    0.122 m  AORTA Ao Root diam: 3.20 cm MITRAL VALVE MV Area (PHT): 3.53 cm    SHUNTS MV Decel Time: 215 msec    Systemic VTI:  0.12 m MV E velocity: 47.30 cm/s  Systemic Diam: 1.90 cm MV A velocity: 55.50 cm/s MV E/A ratio:  0.85 Oswaldo Milian MD Electronically signed by Oswaldo Milian MD Signature Date/Time: 06/24/2020/11:20:35 AM    Final         Scheduled Meds: . aspirin  81 mg Oral q morning  . atorvastatin  20 mg Oral QHS  . Chlorhexidine Gluconate Cloth  6 each Topical Daily  . heparin  5,000 Units Subcutaneous Q8H  . insulin aspart  0-6 Units Subcutaneous TID WC  . metoprolol succinate  25 mg Oral Daily  . potassium chloride  40 mEq Oral BID  . sertraline  50 mg Oral Daily   Continuous Infusions: . ciprofloxacin    . potassium chloride 10 mEq (06/25/20 0759)     LOS: 2 days    Time spent: 35 mins.More than 50% of that time was spent in counseling and/or coordination of care.      Shelly Coss, MD [facility] P4/24/2022, 8:31 AM

## 2020-06-25 NOTE — Progress Notes (Signed)
Pharmacy Antibiotic Note  Frank Hoover is a 70 y.o. male admitted on 06/23/2020 with gastroenteritis d/t Yersinia enterocolitica.   Pharmacy has been consulted for intravenous ciprofloxacin dosing.  Patient started on doxycycline yesterday; now switching to ciprofloxacin. Typically this infection does not require antibiotics except for severe sepsis; would recommend treating for no more than 3 days. Will dose reduce ciprofloxacin 420m once daily d/t CrCl ~22 ml/min.  Plan: Ciprofloxacin 4032monce daily Monitor Scr, clinical symptoms  Weight: 80.8 kg (178 lb 2.1 oz)  Temp (24hrs), Avg:98.6 F (37 C), Min:98.3 F (36.8 C), Max:98.7 F (37.1 C)  Recent Labs  Lab 06/23/20 0305 06/23/20 1005 06/23/20 1930 06/24/20 0202 06/25/20 0221  WBC 5.1  --   --  5.6 7.2  CREATININE 5.37* 5.13* 4.45* 4.20* 3.25*    Estimated Creatinine Clearance: 21.7 mL/min (A) (by C-G formula based on SCr of 3.25 mg/dL (H)).    Allergies  Allergen Reactions  . Peanut Butter Flavor Other (See Comments)    Unknown reaction to peanut butter    Thank you for allowing pharmacy to be a part of this patient's care.  AmAlfonse SprucePharmD PGY2 ID Pharmacy Resident Phone between 7 am - 3:30 pm: [phone] check AMION for all [facility] numbers After 10:00 PM, call [phone]/24/2022 7:54 AM

## 2020-06-26 LAB — BASIC METABOLIC PANEL
Anion gap: 8 (ref 5–15)
BUN: 68 mg/dL — ABNORMAL HIGH (ref 8–23)
CO2: 19 mmol/L — ABNORMAL LOW (ref 22–32)
Calcium: 9 mg/dL (ref 8.9–10.3)
Chloride: 108 mmol/L (ref 98–111)
Creatinine, Ser: 3 mg/dL — ABNORMAL HIGH (ref 0.61–1.24)
GFR, Estimated: 22 mL/min — ABNORMAL LOW (ref >60)
Glucose, Bld: 140 mg/dL — ABNORMAL HIGH (ref 70–99)
Potassium: 3.7 mmol/L (ref 3.5–5.1)
Sodium: 135 mmol/L (ref 135–145)

## 2020-06-26 LAB — GLUCOSE, CAPILLARY
Glucose-Capillary: 135 mg/dL — ABNORMAL HIGH (ref 70–99)
Glucose-Capillary: 192 mg/dL — ABNORMAL HIGH (ref 70–99)
Glucose-Capillary: 175 mg/dL — ABNORMAL HIGH (ref 70–99)
Glucose-Capillary: 175 mg/dL — ABNORMAL HIGH (ref 70–99)

## 2020-06-26 LAB — MAGNESIUM: Magnesium: 1.9 mg/dL (ref 1.7–2.4)

## 2020-06-26 MED ORDER — SODIUM BICARBONATE 650 MG PO TABS
650.0000 mg | ORAL_TABLET | Freq: Two times a day (BID) | ORAL | Status: DC
  Administered 2020-06-26 – 2020-06-28 (×5): 650 mg via ORAL
  Filled 2020-06-26 (×5): qty 1

## 2020-06-26 NOTE — NC FL2 (Signed)
[place] LEVEL OF CARE SCREENING TOOL     IDENTIFICATION  Patient Name: Frank Hoover Birthdate: [birth year] Sex: male Admission Date (Current Location): 06/23/2020  [facility] and [place] Number:  Herbalist and Address:  [facility] [place], [place] [address], [place], [place] [address]      Provider Number: [id number]  Attending Physician Name and Address:  Shelly Coss, MD  Relative Name and Phone Number:  Ross,Tierra ([place])   [phone]    Current Level of Care: Hospital Recommended Level of Care: [place] Prior Approval Number:    Date Approved/Denied:   PASRR Number: [id number]  Discharge Plan: SNF    Current Diagnoses: Patient Active Problem List   Diagnosis Date Noted  . Metabolic acidosis 50/53/9767  . Elevated troponin 06/23/2020  . Gross hematuria 12/10/2019  . ARF (acute renal failure) ([place]) 09/08/2018  . Acute renal failure (ARF) ([place]) 09/08/2018  . Severe sepsis ([place]) 06/26/2018  . Obstipation 06/26/2018  . Acute urinary retention 06/26/2018  . Mild bilateral Hydronephrosis 06/26/2018  . CKD (chronic kidney disease) stage 3, GFR 30-59 ml/min (HCC) 06/26/2018  . NICM (nonischemic cardiomyopathy) ([place]) 06/26/2018  . ICD (implantable cardioverter-defibrillator) in place 06/26/2018  . Hypokalemia 06/26/2018  . Hyperglycemia 06/26/2018  . Proctocolitis 06/26/2018  . Acute lower UTI 06/26/2018  . Pressure injury of skin 06/26/2018  . Chronic systolic CHF (congestive heart failure) ([place]) 06/11/2016  . Diarrhea 04/18/2016  . Anemia 10/17/2015  . Nausea and vomiting 10/17/2015  . Atrial flutter ([place]) 09/27/2015  . Pressure ulcer 09/05/2015  . Abdominal pain   . Atrial fibrillation with RVR ([place])   . HCAP (healthcare-associated pneumonia)   . Acute renal failure superimposed on stage 3b chronic kidney disease ([place])   . Metabolic encephalopathy   . Abdominal  distension   . Urinary tract infection, site not specified   . Cardiogenic shock ([place])   . Chronic combined systolic and diastolic CHF (congestive heart failure) ([place])   . Uncontrolled type 2 diabetes mellitus with complication ([place])   . Urinary retention   . Bacteremia   . Ischemic colitis ([place])   . Noninfectious gastroenteritis, unspecified   . Proctosigmoiditis   . Lower GI bleed   . SVT (supraventricular tachycardia) ([place]) 08/24/2015  . Debility   . Blood in stool   . Acute blood loss anemia   . Abnormal CT scan, colon   . Acute systolic heart failure ([place])   . Acute kidney injury ([place])   . Acute respiratory failure ([place])   . Septic shock ([place])   . Acute encephalopathy   . Atrial flutter with rapid ventricular response ([place]) 08/14/2015  . Systolic CHF ([place]) 34/19/3790  . History of colonic polyps   . Dehydration 12/05/2013  . Acute renal failure ([place]) 12/02/2013  . Sleep apnea 12/02/2013  . Hyponatremia 12/02/2013  . Acute on chronic renal failure ([place]) 12/02/2013  . COPD (chronic obstructive pulmonary disease) ([place])   . DM (diabetes mellitus) ([place])   . HTN (hypertension)   . Hyperlipemia   . CRF (chronic renal failure)   . Hepatomegaly 03/25/2013  . Dysphagia 12/06/2010  . History of adenomatous polyp of colon 09/21/2010  . Gastroparesis 09/21/2010  . Obesity 09/21/2010  . GERD (gastroesophageal reflux disease) 10/09/2009  . Constipation 10/09/2009  . ABDOMINAL PAIN-EPIGASTRIC 10/09/2009    Orientation RESPIRATION BLADDER Height & Weight     Place,Situation,Time,Self  Normal Incontinent,External catheter Weight: 179 lb 3.7 oz (  81.3 kg) Height:     BEHAVIORAL SYMPTOMS/MOOD NEUROLOGICAL BOWEL NUTRITION STATUS      Incontinent Diet (see d/c summary)  AMBULATORY STATUS COMMUNICATION OF NEEDS Skin   Extensive Assist   PU Stage and Appropriate Care (Stage 2)                       Personal Care Assistance Level of Assistance  Dressing,Bathing,Feeding  Bathing Assistance: Maximum assistance Feeding assistance: Maximum assistance Dressing Assistance: Maximum assistance     Functional Limitations Info  Hearing,Sight,Speech Sight Info: Adequate Hearing Info: Adequate Speech Info: Adequate    SPECIAL CARE FACTORS FREQUENCY  PT (By licensed PT),OT (By licensed OT)     PT Frequency: 5x/week OT Frequency: 5x/week            Contractures Contractures Info: Not present    Additional Factors Info  Code Status,Allergies,Insulin Sliding Scale Code Status Info: Full Allergies Info: Peanut Butter Flavor   Insulin Sliding Scale Info: see d/c med list       Current Medications (06/26/2020):  This is the current hospital active medication list Current Facility-Administered Medications  Medication Dose Route Frequency Provider Last Rate Last Admin  . acetaminophen (TYLENOL) tablet 650 mg  650 mg Oral Q6H PRN Opyd, Ilene Qua, MD       Or  . acetaminophen (TYLENOL) suppository 650 mg  650 mg Rectal Q6H PRN Opyd, Ilene Qua, MD      . aspirin chewable tablet 81 mg  81 mg Oral q morning Dunn, Dayna N, PA-C   81 mg at 06/26/20 0952  . atorvastatin (LIPITOR) tablet 20 mg  20 mg Oral QHS Dunn, Dayna N, PA-C   20 mg at 06/25/20 2006  . Chlorhexidine Gluconate Cloth 2 % PADS 6 each  6 each Topical Daily Shelly Coss, MD   6 each at 06/26/20 [id number]  . ciprofloxacin (CIPRO) IVPB 400 mg  400 mg Intravenous Q24H Esmond Plants, [facility]   Stopped at 06/26/20 1048  . heparin injection 5,000 Units  5,000 Units Subcutaneous Q8H Opyd, Ilene Qua, MD   5,000 Units at 06/26/20 0504  . insulin aspart (novoLOG) injection 0-6 Units  0-6 Units Subcutaneous TID WC Opyd, Ilene Qua, MD   1 Units at 06/26/20 1214  . metoprolol succinate (TOPROL-XL) 24 hr tablet 25 mg  25 mg Oral Daily Lelon Perla, MD   25 mg at 06/26/20 0948  . ondansetron (ZOFRAN) tablet 4 mg  4 mg Oral Q6H PRN Opyd, Ilene Qua, MD       Or  . ondansetron (ZOFRAN) injection 4 mg  4 mg  Intravenous Q6H PRN Opyd, Ilene Qua, MD      . potassium chloride SA (KLOR-CON) CR tablet 40 mEq  40 mEq Oral BID Shelly Coss, MD   40 mEq at 06/26/20 0948  . sertraline (ZOLOFT) tablet 50 mg  50 mg Oral Daily Shelly Coss, MD   50 mg at 06/26/20 0948  . sodium bicarbonate tablet 650 mg  650 mg Oral BID Shelly Coss, MD   650 mg at 06/26/20 1216     Discharge Medications: Please see discharge summary for a list of discharge medications.  Relevant Imaging Results:  Relevant Lab Results:   Additional Information [address] Yashira Offenberger, LCSWA

## 2020-06-26 NOTE — Progress Notes (Signed)
PROGRESS NOTE    Frank Hoover  ZOX:[id number] DOB: [birth year] DOA: 06/23/2020 PCP: Hal Morales, DO   Chief Complain: Abnormal labs  Brief Narrative: Patient is a 70 year old male with history of heart failure reduced ejection fraction status post AICD, CKD stage IIIb, COPD, insulin-dependent diabetes mellitus, hypertension who presented from skilled nursing facility to the emergency department at [facility] for the evaluation of elevated BUN and creatinine on routine labs.  On presentation he was hemodynamically stable.  Labs showed creatinine of 5.9.  Troponin was elevated.  Potassium was 2.5.  Patient was transferred here for further work-up. Though he does not have any chest pain, troponin remain severely  elevated.  Cardiology consulted , though this could be secondary to severe AKI.  Kidney function slowly improving.  Hospital course notable for severe diarrhea.  GI pathogen panel showed Yersinia enterocolitica  Assessment & Plan:   Principal Problem:   Acute renal failure superimposed on stage 3b chronic kidney disease (HCC) Active Problems:   COPD (chronic obstructive pulmonary disease) (HCC)   HTN (hypertension)   Chronic combined systolic and diastolic CHF (congestive heart failure) (HCC)   Uncontrolled type 2 diabetes mellitus with complication (HCC)   Hypokalemia   Metabolic acidosis   Elevated troponin   AKI on CKD stage IIIb/metabolic acidosis: Baseline creatinine around 1.8.  Severe elevated creatinine on presentation with 5.9.  Serum bicarb was 14. Urine sodium is less than 10 indicating that he is severely dehydrated.  He was having severe diarrhea .  He was also on torsemide at nursing facility which are on hold. Renal ultrasound showed Increased renal cortical echogenicity , no obvious obstruction.  He has a chronic indwelling Foley catheter. Kidney function is improving.  IV fluids stopped today.  Continue sodium bicarb tablets.  Severe diarrhea:  Now slowed down. this could have contributed to the dehydration.  C. difficile negative but GI pathogen panel showed  Yersinia enterocolitica.  Abdomen is soft and nontender.  Denies any abdominal pain.  Can removerectal tube.  Started on cipro, plan for 3 to 5 days course.  We will follow-up stool culture  Elevated troponin: Denies any chest pain.  Severe elevated troponin.  EKG showed some ST depression in the lateral leads.  This could be secondary to type II MI from supply demand ischemia secondary to severe AKI. Cardiology following, no plan for further intervention.  He has been started on aspirin, statin and metoprolol.  Echo showed improved ejection fraction of 60-65%, no regional wall motion abnormality. Last echocardiogram in 2018 had  shown ejection fraction of 30 to 35%, diffuse hypokinesis, grade 1 diastolic dysfunction.    Insulin-dependent diabetes type 2: Hemoglobin A1c of 6.3 Continue current insulin regimen.  Monitor blood sugars  Hypokalemia: Severely hypokalemic ,now stable . Mg ok. Continue supplementation as needed and monitoring  Hypertension: Currently blood pressure stable.  Continue current medications  Debility/deconditioning: Patient is a skilled nursing facility resident.  Unable to ambulate.  He is overall oriented.  Has severe weakness in bilateral lower extremities.  Looks like he came with Foley catheter from nursing home.Plan for dc tomorrow   Pressure Injury 06/11/16 healed pressure ulcer on the sacrum.  (Active)  06/11/16 1722  Location: Sacrum  Location Orientation:   Staging:   Wound Description (Comments): healed pressure ulcer on the sacrum.   Present on Admission:      Pressure Injury 06/26/18 Stage III -  Full thickness tissue loss. Subcutaneous fat may be visible but bone, tendon  or muscle are NOT exposed. (Active)  06/26/18 1705  Location: Sacrum  Location Orientation: Medial  Staging: Stage III -  Full thickness tissue loss. Subcutaneous fat may  be visible but bone, tendon or muscle are NOT exposed.  Wound Description (Comments):   Present on Admission: Yes     Pressure Injury 08/01/18 Other (Comment) Stage II -  Partial thickness loss of dermis presenting as a shallow open ulcer with a red, pink wound bed without slough. (Active)  08/01/18 0730  Location: Other (Comment) (intergluteal cleft )  Location Orientation:   Staging: Stage II -  Partial thickness loss of dermis presenting as a shallow open ulcer with a red, pink wound bed without slough.  Wound Description (Comments):   Present on Admission: Yes     Pressure Injury 09/08/18 Heel Right Unstageable - Full thickness tissue loss in which the base of the ulcer is covered by slough (yellow, tan, gray, green or brown) and/or eschar (tan, brown or black) in the wound bed. (Active)  09/08/18 0150  Location: Heel  Location Orientation: Right  Staging: Unstageable - Full thickness tissue loss in which the base of the ulcer is covered by slough (yellow, tan, gray, green or brown) and/or eschar (tan, brown or black) in the wound bed.  Wound Description (Comments):   Present on Admission: Yes     Pressure Injury 06/23/20 Mid Stage 2 -  Partial thickness loss of dermis presenting as a shallow open injury with a red, pink wound bed without slough. (Active)  06/23/20 2000  Location:   Location Orientation: Mid  Staging: Stage 2 -  Partial thickness loss of dermis presenting as a shallow open injury with a red, pink wound bed without slough.  Wound Description (Comments):   Present on Admission: Yes   Wound care following.              DVT prophylaxis:Heparin [place] Code Status: Full Family Communication: Called legal guardian and his spouse on their  Numbers multiple times, calls not  received  Status is: Inpatient  Remains inpatient appropriate because:Inpatient level of care appropriate due to severity of illness   Dispo: The patient is from: SNF               Anticipated d/c is to: SNF              Patient currently is not medically stable to d/c.   Difficult to place patient No Plan for discharge tomorrow   Consultants: Cardiology  Procedures:None  Antimicrobials:  Anti-infectives (From admission, onward)   Start     Dose/Rate Route Frequency Ordered Stop   06/25/20 1000  ciprofloxacin (CIPRO) IVPB 400 mg        400 mg 200 mL/hr over 60 Minutes Intravenous Every 24 hours 06/25/20 0747     06/24/20 1000  doxycycline (VIBRAMYCIN) 100 mg in sodium chloride 0.9 % 250 mL IVPB  Status:  Discontinued        100 mg 125 mL/hr over 120 Minutes Intravenous Every 12 hours 06/24/20 0839 06/25/20 0733      Subjective:  Patient seen and examined at the bedside this morning.  Hemodynamically stable.  Looks comfortable today.  Diarrhea has slowed down.  Denies any abdominal pain.  Objective: Vitals:   06/25/20 1743 06/25/20 2005 06/26/20 0500 06/26/20 0505  BP: 133/77 122/63  (!) 151/62  Pulse: 72 64  75  Resp:  17  16  Temp: (!) 97.4 F (36.3 C) 98.4 F (  36.9 C)  98.2 F (36.8 C)  TempSrc: Oral Oral  Oral  SpO2: 100% 99%  100%  Weight:   81.3 kg     Intake/Output Summary (Last 24 hours) at 06/26/2020 0827 Last data filed at 06/25/2020 2000 Gross per 24 hour  Intake 803.95 ml  Output 1950 ml  Net -1146.05 ml   Filed Weights   06/24/20 0500 06/25/20 0441 06/26/20 0500  Weight: 78.7 kg 80.8 kg 81.3 kg    Examination:  General exam: Overall comfortable, not in distress, deconditioned, debilitated HEENT: PERRL Respiratory system:  no wheezes or crackles  Cardiovascular system: S1 & S2 heard, RRR.  Gastrointestinal system: Abdomen is nondistended, soft and nontender. Central nervous system: Alert and awake, intermittently confused, paraplegic Extremities: No edema, no clubbing ,no cyanosis Skin: No rashes, no ulcers,no icterus  GU: foley   Data Reviewed: I have personally reviewed following labs and imaging  studies  CBC: Recent Labs  Lab 06/23/20 0305 06/24/20 0202 06/25/20 0221  WBC 5.1 5.6 7.2  HGB 11.0* 10.3* 9.6*  HCT 32.4* 30.0* 27.3*  MCV 84.6 84.5 84.8  PLT 291 253 161   Basic Metabolic Panel: Recent Labs  Lab 06/23/20 0305 06/23/20 1005 06/23/20 1930 06/23/20 2113 06/24/20 0202 06/24/20 1419 06/25/20 0221 06/25/20 1503 06/26/20 0537  NA 132* 135 132*  --  134*  --  133*  --  135  K 2.8* 2.6* 2.6*  --  2.8* 2.5* 2.9* 3.9 3.7  CL 103 103 102  --  103  --  104  --  108  CO2 14* 15* 15*  --  18*  --  19*  --  19*  GLUCOSE 140* 112* 134*  --  92  --  112*  --  140*  BUN 109* 108* 98*  --  95*  --  78*  --  68*  CREATININE 5.37* 5.13* 4.45*  --  4.20*  --  3.25*  --  3.00*  CALCIUM 8.8* 8.7* 8.4*  --  8.6*  --  8.5*  --  9.0  MG 2.6*  --   --  2.4 2.2  --   --   --  1.9  PHOS 5.9*  --   --   --  3.6  --   --   --   --    GFR: Estimated Creatinine Clearance: 23.6 mL/min (A) (by C-G formula based on SCr of 3 mg/dL (H)). Liver Function Tests: No results for input(s): AST, ALT, ALKPHOS, BILITOT, PROT, ALBUMIN in the last 168 hours. No results for input(s): LIPASE, AMYLASE in the last 168 hours. No results for input(s): AMMONIA in the last 168 hours. Coagulation Profile: No results for input(s): INR, PROTIME in the last 168 hours. Cardiac Enzymes: No results for input(s): CKTOTAL, CKMB, CKMBINDEX, TROPONINI in the last 168 hours. BNP (last 3 results) No results for input(s): PROBNP in the last 8760 hours. HbA1C: No results for input(s): HGBA1C in the last 72 hours. CBG: Recent Labs  Lab 06/25/20 0657 06/25/20 1055 06/25/20 1609 06/25/20 2129 06/26/20 0801  GLUCAP 100* 156* 123* 161* 135*   Lipid Profile: No results for input(s): CHOL, HDL, LDLCALC, TRIG, CHOLHDL, LDLDIRECT in the last 72 hours. Thyroid Function Tests: No results for input(s): TSH, T4TOTAL, FREET4, T3FREE, THYROIDAB in the last 72 hours. Anemia Panel: No results for input(s): VITAMINB12,  FOLATE, FERRITIN, TIBC, IRON, RETICCTPCT in the last 72 hours. Sepsis Labs: No results for input(s): PROCALCITON, LATICACIDVEN in the last 168 hours.  Recent Results (from the past 240 hour(s))  MRSA PCR Screening     Status: None   Collection Time: 06/23/20  3:08 AM   Specimen: Nasopharyngeal  Result Value Ref Range Status   MRSA by PCR NEGATIVE NEGATIVE Final    Comment:        The GeneXpert MRSA Assay (FDA approved for NASAL specimens only), is one component of a comprehensive MRSA colonization surveillance program. It is not intended to diagnose MRSA infection nor to guide or monitor treatment for MRSA infections. Performed at [facility], [place] [address], [place], [place] [address]   Gastrointestinal Panel by PCR , Stool     Status: Abnormal   Collection Time: 06/23/20  9:29 AM   Specimen: Stool  Result Value Ref Range Status   Campylobacter species NOT DETECTED NOT DETECTED Final   Plesimonas shigelloides NOT DETECTED NOT DETECTED Final   Salmonella species NOT DETECTED NOT DETECTED Final   Yersinia enterocolitica DETECTED (A) NOT DETECTED Final    Comment: RESULT CALLED TO, READ BACK BY AND VERIFIED WITH: VICTOR AGBAI @1732  ON 06/23/20 SKL    Vibrio species NOT DETECTED NOT DETECTED Final   Vibrio cholerae NOT DETECTED NOT DETECTED Final   Enteroaggregative E coli (EAEC) NOT DETECTED NOT DETECTED Final   Enteropathogenic E coli (EPEC) NOT DETECTED NOT DETECTED Final   Enterotoxigenic E coli (ETEC) NOT DETECTED NOT DETECTED Final   Shiga like toxin producing E coli (STEC) NOT DETECTED NOT DETECTED Final   Shigella/Enteroinvasive E coli (EIEC) NOT DETECTED NOT DETECTED Final   Cryptosporidium NOT DETECTED NOT DETECTED Final   Cyclospora cayetanensis NOT DETECTED NOT DETECTED Final   Entamoeba histolytica NOT DETECTED NOT DETECTED Final   Giardia lamblia NOT DETECTED NOT DETECTED Final   Adenovirus F40/41 NOT DETECTED NOT DETECTED Final   Astrovirus NOT DETECTED  NOT DETECTED Final   Norovirus GI/GII NOT DETECTED NOT DETECTED Final   Rotavirus A NOT DETECTED NOT DETECTED Final   Sapovirus (I, II, IV, and V) NOT DETECTED NOT DETECTED Final    Comment: Performed at [facility], [address] [place], [place] [address]  C Difficile Quick Screen w PCR reflex     Status: None   Collection Time: 06/23/20  9:38 AM   Specimen: STOOL  Result Value Ref Range Status   C Diff antigen NEGATIVE NEGATIVE Final   C Diff toxin NEGATIVE NEGATIVE Final   C Diff interpretation No C. difficile detected.  Final    Comment: Performed at [facility], [place] [address] [place], [place] [address]         Radiology Studies: ECHOCARDIOGRAM COMPLETE  Result Date: 06/24/2020    ECHOCARDIOGRAM REPORT   Patient Name:   Frank Hoover Date of Exam: 06/24/2020 Medical Rec #:  [id number]        Height:       66.0 in Accession #:    [id number]       Weight:       173.5 lb Date of Birth:  1950/10/24       BSA:          1.883 m Patient Age:    89 years         BP:           123/66 mmHg Patient Gender: M                HR:  66 bpm. Exam Location:  Inpatient Procedure: 2D Echo, Cardiac Doppler, Color Doppler and Intracardiac            Opacification Agent Indications:    Elevated Troponin  History:        Patient has prior history of Echocardiogram examinations, most                 recent 04/01/2016. Medical history of chronic combined                 systolic/diastolic congestive heart failure, previous ICD,                 atrial fibrillation/flutter, AVNRT ablation, chronic kidney                 disease, diabetes mellitus, hypertension admitted with acute on                 chronic stage IIIb kidney disease for evaluation of elevated                 troponin.  Sonographer:    Darlina Sicilian RDCS Referring Phys: [id number] [place]  1. Left ventricular ejection fraction, by estimation, is 60 to 65%. The left ventricle has normal function. The left  ventricle has no regional wall motion abnormalities. There is mild left ventricular hypertrophy. Left ventricular diastolic parameters are indeterminate.  2. Right ventricular systolic function is mildly reduced. The right ventricular size is normal. Tricuspid regurgitation signal is inadequate for assessing PA pressure.  3. The mitral valve is normal in structure. No evidence of mitral valve regurgitation. No evidence of mitral stenosis.  4. The aortic valve is tricuspid. Aortic valve regurgitation is not visualized. Mild aortic valve sclerosis is present, with no evidence of aortic valve stenosis. FINDINGS  Left Ventricle: Left ventricular ejection fraction, by estimation, is 60 to 65%. The left ventricle has normal function. The left ventricle has no regional wall motion abnormalities. Definity contrast agent was given IV to delineate the left ventricular  endocardial borders. The left ventricular internal cavity size was normal in size. There is mild left ventricular hypertrophy. Left ventricular diastolic parameters are indeterminate. Right Ventricle: The right ventricular size is normal. No increase in right ventricular wall thickness. Right ventricular systolic function is mildly reduced. Tricuspid regurgitation signal is inadequate for assessing PA pressure. Left Atrium: Left atrial size was normal in size. Right Atrium: Right atrial size was normal in size. Pericardium: There is no evidence of pericardial effusion. Mitral Valve: The mitral valve is normal in structure. No evidence of mitral valve regurgitation. No evidence of mitral valve stenosis. Tricuspid Valve: The tricuspid valve is normal in structure. Tricuspid valve regurgitation is not demonstrated. Aortic Valve: The aortic valve is tricuspid. Aortic valve regurgitation is not visualized. Mild aortic valve sclerosis is present, with no evidence of aortic valve stenosis. Pulmonic Valve: The pulmonic valve was not well visualized. Pulmonic valve  regurgitation is not visualized. Aorta: The aortic root is normal in size and structure. IAS/Shunts: The interatrial septum was not well visualized.  LEFT VENTRICLE PLAX 2D LVIDd:         5.20 cm      Diastology LVIDs:         3.50 cm      LV e' medial:    2.94 cm/s LV PW:         1.10 cm      LV E/e' medial:  16.1 LV IVS:  1.00 cm      LV e' lateral:   5.87 cm/s LVOT diam:     1.90 cm      LV E/e' lateral: 8.1 LV SV:         35 LV SV Index:   18 LVOT Area:     2.84 cm  LV Volumes (MOD) LV vol d, MOD A2C: 144.0 ml LV vol d, MOD A4C: 129.0 ml LV vol s, MOD A2C: 53.4 ml LV vol s, MOD A4C: 46.1 ml LV SV MOD A2C:     90.6 ml LV SV MOD A4C:     129.0 ml LV SV MOD BP:      90.1 ml RIGHT VENTRICLE RV S prime:     7.18 cm/s TAPSE (M-mode): 1.4 cm LEFT ATRIUM             Index       RIGHT ATRIUM          Index LA diam:        3.90 cm 2.07 cm/m  RA Area:     8.45 cm LA Vol (A2C):   38.2 ml 20.29 ml/m RA Volume:   14.00 ml 7.44 ml/m LA Vol (A4C):   28.4 ml 15.09 ml/m LA Biplane Vol: 33.1 ml 17.58 ml/m  AORTIC VALVE LVOT Vmax:   80.90 cm/s LVOT Vmean:  50.900 cm/s LVOT VTI:    0.122 m  AORTA Ao Root diam: 3.20 cm MITRAL VALVE MV Area (PHT): 3.53 cm    SHUNTS MV Decel Time: 215 msec    Systemic VTI:  0.12 m MV E velocity: 47.30 cm/s  Systemic Diam: 1.90 cm MV A velocity: 55.50 cm/s MV E/A ratio:  0.85 Oswaldo Milian MD Electronically signed by Oswaldo Milian MD Signature Date/Time: 06/24/2020/11:20:35 AM    Final         Scheduled Meds: . aspirin  81 mg Oral q morning  . atorvastatin  20 mg Oral QHS  . Chlorhexidine Gluconate Cloth  6 each Topical Daily  . heparin  5,000 Units Subcutaneous Q8H  . insulin aspart  0-6 Units Subcutaneous TID WC  . metoprolol succinate  25 mg Oral Daily  . potassium chloride  40 mEq Oral BID  . sertraline  50 mg Oral Daily   Continuous Infusions: . ciprofloxacin 400 mg (06/25/20 0921)     LOS: 3 days    Time spent: 35 mins.More than 50% of that time  was spent in counseling and/or coordination of care.      Shelly Coss, MD [facility] P4/25/2022, 8:27 AM

## 2020-06-26 NOTE — TOC Progression Note (Signed)
Transition of Care [facility]) - Progression Note    Patient Details  Name: Frank Hoover MRN: [id number] Date of Birth: Aug 26, 1950  Transition of Care [facility]) CM/SW Contact  Milinda Antis, [place] Phone Number: 06/26/2020, 12:51 PM  Clinical Narrative:    CSW spoke with Ebony Hail [phone] at [facility].  Ebony Hail confirmed that the patient could return when medically ready for d/c.  Patient will need a COVID test within 48 hours of return and the discharge orders/summary will need to be submitted to the SNF.    FL2 completed and faxed to [facility] on this day.        Expected Discharge Plan and Services                                                 Social Determinants of Health (SDOH) Interventions    Readmission Risk Interventions Readmission Risk Prevention Plan 07/31/2018 06/29/2018  Transportation Screening Complete Complete  PCP or Specialist Appt within 3-5 Days Complete Complete  HRI or Home Care Consult Complete Complete  Social Work Consult for [place] Planning/Counseling - Complete  Palliative Care Screening Not Complete Not Applicable  Medication Review Press photographer) Complete Complete  Some recent data might be hidden

## 2020-06-26 NOTE — Progress Notes (Signed)
   No new recommendations at this time.  Please let us know if we can be of further assistance.  Candee Furbish, MD

## 2020-06-26 NOTE — TOC Initial Note (Signed)
Transition of Care [facility]) - Initial/Assessment Note    Patient Details  Name: Frank Hoover MRN: [id number] Date of Birth: 1950/12/22  Transition of [facility]) CM/SW Contact:    Coralee Pesa, [place] Phone Number: 06/26/2020, 1:56 PM  Clinical Narrative:                 CSW spoke to pt's legal Guardian, Kristine Garbe, who is with [facility]. She noted that pt  Has been at [facility] for at least 2 years and the plan is for him to return, SW confirmed he could return when medically ready. SW will follow for DC needs.  Expected Discharge Plan: Skilled Nursing Facility Barriers to Discharge: Continued Medical Work up   Patient Goals and CMS Choice     Choice offered to / list presented to : [place] / Guardian  Expected Discharge Plan and Services Expected Discharge Plan: [place] Choice: [place] Living arrangements for the past 2 months: [place]                                      Prior Living Arrangements/Services Living arrangements for the past 2 months: [place] Lives with:: Facility Resident Patient language and need for interpreter reviewed:: Yes Do you feel safe going back to the place where you live?: Yes      Need for Family Participation in Patient Care: Yes (Comment) Care giver support system in place?: Yes (comment)   Criminal Activity/Legal Involvement Pertinent to Current Situation/Hospitalization: No - Comment as needed  Activities of Daily Living      Permission Sought/Granted Permission sought to share information with : Facility Contact Representative,Guardian Permission granted to share information with : Yes, Verbal Permission Granted  Share Information with NAME: Marinda Elk     Permission granted to share info w Relationship: [place] DSS legal gaurdian  Permission granted to share info w Contact Information: [phone]  Emotional  Assessment Appearance:: Appears stated age     Orientation: : Oriented to Self,Oriented to Place,Oriented to  Time,Oriented to Situation Alcohol / Substance Use: Not Applicable Psych Involvement: No (comment)  Admission diagnosis:  Acute renal failure superimposed on stage 3b chronic kidney disease ([place]) [N17.9, N18.32] Patient Active Problem List   Diagnosis Date Noted  . Metabolic acidosis 37/90/2409  . Elevated troponin 06/23/2020  . Gross hematuria 12/10/2019  . ARF (acute renal failure) ([place]) 09/08/2018  . Acute renal failure (ARF) ([place]) 09/08/2018  . Severe sepsis ([place]) 06/26/2018  . Obstipation 06/26/2018  . Acute urinary retention 06/26/2018  . Mild bilateral Hydronephrosis 06/26/2018  . CKD (chronic kidney disease) stage 3, GFR 30-59 ml/min (HCC) 06/26/2018  . NICM (nonischemic cardiomyopathy) ([place]) 06/26/2018  . ICD (implantable cardioverter-defibrillator) in place 06/26/2018  . Hypokalemia 06/26/2018  . Hyperglycemia 06/26/2018  . Proctocolitis 06/26/2018  . Acute lower UTI 06/26/2018  . Pressure injury of skin 06/26/2018  . Chronic systolic CHF (congestive heart failure) ([place]) 06/11/2016  . Diarrhea 04/18/2016  . Anemia 10/17/2015  . Nausea and vomiting 10/17/2015  . Atrial flutter ([place]) 09/27/2015  . Pressure ulcer 09/05/2015  . Abdominal pain   . Atrial fibrillation with RVR ([place])   . HCAP (healthcare-associated pneumonia)   . Acute renal failure superimposed on stage 3b chronic kidney disease ([place])   . Metabolic encephalopathy   . Abdominal  distension   . Urinary tract infection, site not specified   . Cardiogenic shock ([place])   . Chronic combined systolic and diastolic CHF (congestive heart failure) ([place])   . Uncontrolled type 2 diabetes mellitus with complication ([place])   . Urinary retention   . Bacteremia   . Ischemic colitis ([place])   . Noninfectious gastroenteritis, unspecified   . Proctosigmoiditis   . Lower GI bleed   . SVT (supraventricular  tachycardia) ([place]) 08/24/2015  . Debility   . Blood in stool   . Acute blood loss anemia   . Abnormal CT scan, colon   . Acute systolic heart failure ([place])   . Acute kidney injury ([place])   . Acute respiratory failure ([place])   . Septic shock ([place])   . Acute encephalopathy   . Atrial flutter with rapid ventricular response ([place]) 08/14/2015  . Systolic CHF ([place]) 27/08/2374  . History of colonic polyps   . Dehydration 12/05/2013  . Acute renal failure ([place]) 12/02/2013  . Sleep apnea 12/02/2013  . Hyponatremia 12/02/2013  . Acute on chronic renal failure ([place]) 12/02/2013  . COPD (chronic obstructive pulmonary disease) ([place])   . DM (diabetes mellitus) ([place])   . HTN (hypertension)   . Hyperlipemia   . CRF (chronic renal failure)   . Hepatomegaly 03/25/2013  . Dysphagia 12/06/2010  . History of adenomatous polyp of colon 09/21/2010  . Gastroparesis 09/21/2010  . Obesity 09/21/2010  . GERD (gastroesophageal reflux disease) 10/09/2009  . Constipation 10/09/2009  . ABDOMINAL PAIN-EPIGASTRIC 10/09/2009   PCP:  Hal Morales, DO Pharmacy:  No Pharmacies Listed    Social Determinants of Health (SDOH) Interventions    Readmission Risk Interventions Readmission Risk Prevention Plan 07/31/2018 06/29/2018  Transportation Screening Complete Complete  PCP or Specialist Appt within 3-5 Days Complete Complete  HRI or [place] Complete Complete  Social Work Consult for [place] Planning/Counseling - Complete  Palliative Care Screening Not Complete Not Applicable  Medication Review Press photographer) Complete Complete  Some recent data might be hidden

## 2020-06-26 NOTE — Care Management Important Message (Signed)
Important Message  Patient Details  Name: AIRAM HEIDECKER MRN: [id number] Date of Birth: 07-15-1950   Medicare Important Message Given:  Yes     Barb Merino Egeland 06/26/2020, 12:45 PM

## 2020-06-27 LAB — BASIC METABOLIC PANEL
Anion gap: 6 (ref 5–15)
BUN: 64 mg/dL — ABNORMAL HIGH (ref 8–23)
CO2: 20 mmol/L — ABNORMAL LOW (ref 22–32)
Calcium: 9.3 mg/dL (ref 8.9–10.3)
Chloride: 108 mmol/L (ref 98–111)
Creatinine, Ser: 2.87 mg/dL — ABNORMAL HIGH (ref 0.61–1.24)
GFR, Estimated: 23 mL/min — ABNORMAL LOW (ref >60)
Glucose, Bld: 165 mg/dL — ABNORMAL HIGH (ref 70–99)
Potassium: 3.8 mmol/L (ref 3.5–5.1)
Sodium: 134 mmol/L — ABNORMAL LOW (ref 135–145)

## 2020-06-27 LAB — RESP PANEL BY RT-PCR (FLU A&B, COVID) ARPGX2
Influenza A by PCR: NEGATIVE
Influenza B by PCR: NEGATIVE
SARS Coronavirus 2 by RT PCR: NEGATIVE

## 2020-06-27 LAB — GLUCOSE, CAPILLARY
Glucose-Capillary: 146 mg/dL — ABNORMAL HIGH (ref 70–99)
Glucose-Capillary: 199 mg/dL — ABNORMAL HIGH (ref 70–99)
Glucose-Capillary: 143 mg/dL — ABNORMAL HIGH (ref 70–99)
Glucose-Capillary: 137 mg/dL — ABNORMAL HIGH (ref 70–99)

## 2020-06-27 MED ORDER — METOPROLOL SUCCINATE ER 25 MG PO TB24: 25.0000 mg | ORAL_TABLET | Freq: Every day | ORAL | Status: AC

## 2020-06-27 MED ORDER — POTASSIUM CHLORIDE CRYS ER 20 MEQ PO TBCR: 20.0000 meq | EXTENDED_RELEASE_TABLET | Freq: Every morning | ORAL | Status: AC

## 2020-06-27 MED ORDER — SODIUM BICARBONATE 650 MG PO TABS: 650.0000 mg | ORAL_TABLET | Freq: Three times a day (TID) | ORAL | Status: AC

## 2020-06-27 NOTE — Plan of Care (Signed)

## 2020-06-27 NOTE — TOC Progression Note (Signed)
Transition of Care [facility]) - Progression Note    Patient Details  Name: Frank Hoover MRN: [id number] Date of Birth: September 10, 1950  Transition of [facility]) CM/SW Contact  Milinda Antis, [place] Phone Number: 06/27/2020, 12:23 PM  Clinical Narrative:    CSW spoke with Ebony Hail at [facility].  The patient can return once the negative COVID test has been received.  The nurse is to call report to [phone].     Expected Discharge Plan: Skilled Nursing Facility Barriers to Discharge: Continued Medical Work up  Expected Discharge Plan and Services Expected Discharge Plan: [place] Choice: [place] arrangements for the past 2 months: [place] Expected Discharge Date: 06/27/20                                     Social Determinants of Health (SDOH) Interventions    Readmission Risk Interventions Readmission Risk Prevention Plan 07/31/2018 06/29/2018  Transportation Screening Complete Complete  PCP or Specialist Appt within 3-5 Days Complete Complete  HRI or Home Care Consult Complete Complete  Social Work Consult for [place] Planning/Counseling - Complete  Palliative Care Screening Not Complete Not Applicable  Medication Review Press photographer) Complete Complete  Some recent data might be hidden

## 2020-06-27 NOTE — Progress Notes (Signed)
Remote ICD transmission.   

## 2020-06-27 NOTE — Plan of Care (Signed)
  Problem: Elimination: Goal: Will not experience complications related to bowel motility Outcome: Progressing   Problem: Skin Integrity: Goal: Risk for impaired skin integrity will decrease Outcome: Progressing   

## 2020-06-27 NOTE — Discharge Summary (Addendum)
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  On presentation he was hemodynamically stable.  Labs showed creatinine of 5.9.  Troponin was elevated.  Potassium was 2.5.  Patient was transferred here for further work-up. Though he did not  not have any chest pain, troponin was severely  elevated.  Cardiology consulted and no intervention done . Kidney function slowly improved with IV fluids Hospital course notable for severe diarrhea.  GI pathogen panel showed Yersinia enterocolitica, finished 3 days course of ciprofloxacin.  Overall status has remained stable.  He is medically stable for discharge to skilled nursing facility.  06/28/20: PT without any complaints this am. No overnight issues.  Following problems were addressed during his hospitalization:  AKI on CKD stage IIIb/metabolic acidosis: Baseline creatinine around 1.8.  Severe elevated creatinine on presentation with 5.9.  Serum bicarb was 14. Urine sodium is less than 10 indicating that he is severely dehydrated.  He was having severe diarrhea .  He was also on torsemide at nursing facility which is on hold. Renal ultrasound showed Increased renal cortical echogenicity , no obvious obstruction.  He has a chronic indwelling Foley catheter. Kidney function is improving.  IV  fluids stopped .  Continue sodium bicarb tablets.Check BmP in a week  Severe diarrhea: Now stopped. Diarrhoea could have contributed to the dehydration.  C. difficile negative but GI pathogen panel showed  Yersinia enterocolitica.   Started on cipro,finished 3 days course.    Elevated troponin: Denied any chest pain.  Severely  elevated troponin.  EKG showed some ST depression in the lateral leads.  This could be secondary to type II MI from supply demand ischemia secondary to severe AKI. Cardiology following, no plan for further intervention.  He has been started on aspirin, statin and metoprolol.  Echo showed improved ejection fraction of 60-65%, no regional wall motion abnormality. Last echocardiogram in 2018 had  shown ejection fraction of 30 to 35%, diffuse hypokinesis, grade 1 diastolic dysfunction.    Insulin-dependent diabetes type 2: Hemoglobin A1c of 6.3 Continue previous regimen  Hypokalemia: Severely hypokalemic ,now stable . Mg ok. Continue supplementation as needed and monitoring  Hypertension: Currently blood pressure stable.  Continue current medications  Debility/deconditioning: Patient is a skilled nursing facility resident.  Unable to ambulate.  He is overall oriented.  Has severe weakness in bilateral lower extremities.  Looks like he came with Foley catheter from nursing home.   Discharge Diagnoses:  Principal Problem:   Acute renal failure superimposed on stage 3b chronic kidney disease (HCC) Active Problems:   COPD (chronic obstructive pulmonary disease) (HCC)   HTN (hypertension)   Chronic combined systolic and diastolic CHF (congestive heart failure) (HCC)   Uncontrolled type 2 diabetes mellitus with complication (HCC)   Hypokalemia   Metabolic acidosis   Elevated troponin    Discharge Instructions  Discharge Instructions    Diet - low sodium heart healthy   Complete by:  As directed    Discharge instructions   Complete by: As directed    1)Please  take prescribed medications as instructed 2)Do a BMP test in a week to check your kidney function 3)Follow up with your PCP in a week   Discharge wound care:   Complete by: As directed    As per wound care nurse   Increase activity slowly   Complete by: As directed      Allergies as of 06/27/2020      Reactions   Peanut Butter Flavor Other (See Comments)   Unknown reaction to peanut butter      Medication List    STOP taking these medications   bisacodyl 10 MG suppository Commonly known as: DULCOLAX   cefTRIAXone 1 g injection Commonly known as: ROCEPHIN   FLEET ENEMA RE   fosfomycin 3 g Pack Commonly known as: MONUROL   loperamide 2 MG tablet Commonly known as: IMODIUM A-D   metoCLOPramide 5 MG tablet Commonly known as: REGLAN   metoprolol tartrate 50 MG tablet Commonly known as: LOPRESSOR   polyethylene glycol 17 g packet Commonly known as: MIRALAX / GLYCOLAX   senna 8.6 MG Tabs tablet Commonly known as: SENOKOT   Simethicone 180 MG Caps   torsemide 20 MG tablet Commonly known as: DEMADEX     TAKE these medications   acetaminophen 325 MG tablet Commonly known as: TYLENOL Take 2 tablets (650 mg total) by mouth every 6 (six) hours as needed for mild pain (or Fever >/= 101). What changed:   when to take this  reasons to take this   allopurinol 100 MG tablet Commonly known as: ZYLOPRIM Take 2 tablets (200 mg total) by mouth daily.   aspirin 81 MG chewable tablet Chew 81 mg by mouth every morning.   atorvastatin 20 MG tablet Commonly known as: LIPITOR Take 20 mg by mouth at bedtime.   cholecalciferol 1000 units tablet Commonly known as: VITAMIN D Take 1,000 Units by mouth every morning.   ferrous sulfate 325 (65 FE) MG tablet Take 325 mg by mouth 2 (two) times daily.   gabapentin 400 MG capsule Commonly known as: NEURONTIN Take 400 mg by mouth in the morning and at bedtime.   gabapentin 100 MG capsule Commonly known as: NEURONTIN Take  200 mg by mouth daily at 2 PM.   hydrALAZINE 25 MG tablet Commonly known as: APRESOLINE Take 25 mg by mouth 3 (three) times daily.   isosorbide mononitrate 30 MG 24 hr tablet Commonly known as: IMDUR TAKE 1 TABLET DAILY What changed: when to take this   levothyroxine 50 MCG tablet Commonly known as: SYNTHROID Take 50 mcg by mouth daily at 6 (six) AM.   linaclotide 72 MCG capsule Commonly known as: LINZESS Take 72 mcg by mouth every morning.   loratadine 10 MG tablet Commonly known as: CLARITIN Take 10 mg by mouth every morning.   metoprolol succinate 25 MG 24 hr tablet Commonly known as: TOPROL-XL Take 1 tablet (25 mg total) by mouth daily. Start taking on: June 28, 2020   Milk of Magnesia 400 MG/5ML suspension Generic drug: magnesium hydroxide Take 30 mLs by mouth daily as needed (bowel management).   multivitamin with minerals Tabs tablet Take 1 tablet by mouth daily.   NUTRITIONAL SUPPLEMENT PO Take 90 mLs by mouth 3 (three) times daily. House 2.0   pantoprazole 20 MG tablet Commonly known as: PROTONIX Take 40 mg by mouth every morning.   potassium chloride SA 20  MEQ tablet Commonly known as: KLOR-CON Take 1 tablet (20 mEq total) by mouth every morning. Take 2 tabs (40 meq) daily for 7 days from tomorrow then continue taking 20 mEq daily What changed: additional instructions   sertraline 100 MG tablet Commonly known as: ZOLOFT Take 100 mg by mouth every morning.   sodium bicarbonate 650 MG tablet Take 1 tablet (650 mg total) by mouth 3 (three) times daily.   vitamin C 500 MG tablet Commonly known as: ASCORBIC ACID Take 500 mg by mouth 2 (two) times daily.            Discharge Care Instructions  (From admission, onward)         Start     Ordered   06/27/20 0000  Discharge wound care:       Comments: As per wound care nurse   06/27/20 1055          Follow-up Information    Simpson-Tarokh, Leann, DO. Schedule an appointment as soon as  possible for a visit in 1 week(s).   Specialty: Family Medicine Contact information: [address] [place]  [address] [phone]              Allergies  Allergen Reactions  . Peanut Butter Flavor Other (See Comments)    Unknown reaction to peanut butter    Consultations:  Cardiology   Procedures/Studies: US RENAL  Result Date: 06/23/2020 CLINICAL DATA:  Acute renal failure on a background of CKD stage 3b EXAM: RENAL / URINARY TRACT ULTRASOUND COMPLETE COMPARISON:  Ultrasound 09/08/2018 FINDINGS: Right Kidney: Renal measurements: 9 x 4.5 x 3.8 cm = volume: 80.6 mL. Increased renal cortical echogenicity. No concerning renal mass, shadowing calculus or hydronephrosis. Partially obscured by adjacent bowel gas. Left Kidney: Renal measurements: 9.1 x 4.8 x 4.2 cm = volume: 98 mL. Increased renal cortical echogenicity. No visible concerning renal mass, shadowing calculus or hydronephrosis. Partially obscured by adjacent bowel gas and rib shadowing. Bladder: Bladder partially decompressed by inflated Foley catheter. Some mild bladder wall thickening is nonspecific given instrumentation. Other: None. IMPRESSION: 1. Increased renal cortical echogenicity compatible with medical renal disease. 2. No other acute renal abnormalities though both kidneys are partially obscured by adjacent bowel gas. 3. Bladder partially decompressed by Foley catheter. Mild bladder wall thickening is nonspecific given instrumentation. Electronically Signed   By: Lovena Le M.D.   On: 06/23/2020 04:05   ECHOCARDIOGRAM COMPLETE  Result Date: 06/24/2020    ECHOCARDIOGRAM REPORT   Patient Name:   CALDER OBLINGER Date of Exam: 06/24/2020 Medical Rec #:  [id number]        Height:       66.0 in Accession #:    [id number]       Weight:       173.5 lb Date of Birth:  01-Apr-1950       BSA:          1.883 m Patient Age:    20 years         BP:           123/66 mmHg Patient Gender: M                HR:           66 bpm.  Exam Location:  Inpatient Procedure: 2D Echo, Cardiac Doppler, Color Doppler and Intracardiac            Opacification Agent Indications:    Elevated Troponin  History:  Patient has prior history of Echocardiogram examinations, most                 recent 04/01/2016. Medical history of chronic combined                 systolic/diastolic congestive heart failure, previous ICD,                 atrial fibrillation/flutter, AVNRT ablation, chronic kidney                 disease, diabetes mellitus, hypertension admitted with acute on                 chronic stage IIIb kidney disease for evaluation of elevated                 troponin.  Sonographer:    Darlina Sicilian RDCS Referring Phys: [id number] [place]  1. Left ventricular ejection fraction, by estimation, is 60 to 65%. The left ventricle has normal function. The left ventricle has no regional wall motion abnormalities. There is mild left ventricular hypertrophy. Left ventricular diastolic parameters are indeterminate.  2. Right ventricular systolic function is mildly reduced. The right ventricular size is normal. Tricuspid regurgitation signal is inadequate for assessing PA pressure.  3. The mitral valve is normal in structure. No evidence of mitral valve regurgitation. No evidence of mitral stenosis.  4. The aortic valve is tricuspid. Aortic valve regurgitation is not visualized. Mild aortic valve sclerosis is present, with no evidence of aortic valve stenosis. FINDINGS  Left Ventricle: Left ventricular ejection fraction, by estimation, is 60 to 65%. The left ventricle has normal function. The left ventricle has no regional wall motion abnormalities. Definity contrast agent was given IV to delineate the left ventricular  endocardial borders. The left ventricular internal cavity size was normal in size. There is mild left ventricular hypertrophy. Left ventricular diastolic parameters are indeterminate. Right Ventricle: The right ventricular size  is normal. No increase in right ventricular wall thickness. Right ventricular systolic function is mildly reduced. Tricuspid regurgitation signal is inadequate for assessing PA pressure. Left Atrium: Left atrial size was normal in size. Right Atrium: Right atrial size was normal in size. Pericardium: There is no evidence of pericardial effusion. Mitral Valve: The mitral valve is normal in structure. No evidence of mitral valve regurgitation. No evidence of mitral valve stenosis. Tricuspid Valve: The tricuspid valve is normal in structure. Tricuspid valve regurgitation is not demonstrated. Aortic Valve: The aortic valve is tricuspid. Aortic valve regurgitation is not visualized. Mild aortic valve sclerosis is present, with no evidence of aortic valve stenosis. Pulmonic Valve: The pulmonic valve was not well visualized. Pulmonic valve regurgitation is not visualized. Aorta: The aortic root is normal in size and structure. IAS/Shunts: The interatrial septum was not well visualized.  LEFT VENTRICLE PLAX 2D LVIDd:         5.20 cm      Diastology LVIDs:         3.50 cm      LV e' medial:    2.94 cm/s LV PW:         1.10 cm      LV E/e' medial:  16.1 LV IVS:        1.00 cm      LV e' lateral:   5.87 cm/s LVOT diam:     1.90 cm      LV E/e' lateral: 8.1 LV SV:  35 LV SV Index:   18 LVOT Area:     2.84 cm  LV Volumes (MOD) LV vol d, MOD A2C: 144.0 ml LV vol d, MOD A4C: 129.0 ml LV vol s, MOD A2C: 53.4 ml LV vol s, MOD A4C: 46.1 ml LV SV MOD A2C:     90.6 ml LV SV MOD A4C:     129.0 ml LV SV MOD BP:      90.1 ml RIGHT VENTRICLE RV S prime:     7.18 cm/s TAPSE (M-mode): 1.4 cm LEFT ATRIUM             Index       RIGHT ATRIUM          Index LA diam:        3.90 cm 2.07 cm/m  RA Area:     8.45 cm LA Vol (A2C):   38.2 ml 20.29 ml/m RA Volume:   14.00 ml 7.44 ml/m LA Vol (A4C):   28.4 ml 15.09 ml/m LA Biplane Vol: 33.1 ml 17.58 ml/m  AORTIC VALVE LVOT Vmax:   80.90 cm/s LVOT Vmean:  50.900 cm/s LVOT VTI:    0.122 m   AORTA Ao Root diam: 3.20 cm MITRAL VALVE MV Area (PHT): 3.53 cm    SHUNTS MV Decel Time: 215 msec    Systemic VTI:  0.12 m MV E velocity: 47.30 cm/s  Systemic Diam: 1.90 cm MV A velocity: 55.50 cm/s MV E/A ratio:  0.85 Oswaldo Milian MD Electronically signed by Oswaldo Milian MD Signature Date/Time: 06/24/2020/11:20:35 AM    Final    CUP PACEART REMOTE DEVICE CHECK  Result Date: 06/13/2020 Scheduled remote reviewed. Normal device function.  Next remote 91 days. LH     Subjective: Patient seen and examined the bedside this morning.  Hemodynamically stable for discharge.  Discharge Exam: Vitals:   06/27/20 0558 06/27/20 0826  BP: 110/65 116/68  Pulse: (!) 50 64  Resp: 16 17  Temp: 98.9 F (37.2 C) 98 F (36.7 C)  SpO2: 100% 100%   Vitals:   06/26/20 1602 06/26/20 2050 06/27/20 0558 06/27/20 0826  BP: 135/65 136/73 110/65 116/68  Pulse: 67 68 (!) 50 64  Resp: 16 18 16 17   Temp: 98.4 F (36.9 C) 98.6 F (37 C) 98.9 F (37.2 C) 98 F (36.7 C)  TempSrc: Oral Oral Oral Oral  SpO2: 100% 100% 100% 100%  Weight:        General: Pt is alert, awake, not in acute distress Cardiovascular: RRR, S1/S2 +, no rubs, no gallops Respiratory: CTA bilaterally, no wheezing, no rhonchi Abdominal: Soft, NT, ND, bowel sounds + Extremities: no edema, no cyanosis    The results of significant diagnostics from this hospitalization (including imaging, microbiology, ancillary and laboratory) are listed below for reference.     Microbiology: Recent Results (from the past 240 hour(s))  MRSA PCR Screening     Status: None   Collection Time: 06/23/20  3:08 AM   Specimen: Nasopharyngeal  Result Value Ref Range Status   MRSA by PCR NEGATIVE NEGATIVE Final    Comment:        The GeneXpert MRSA Assay (FDA approved for NASAL specimens only), is one component of a comprehensive MRSA colonization surveillance program. It is not intended to diagnose MRSA infection nor to guide  or monitor treatment for MRSA infections. Performed at [facility], [place] [address] [place], [place] [address]   Gastrointestinal Panel by PCR , Stool  Status: Abnormal   Collection Time: 06/23/20  9:29 AM   Specimen: Stool  Result Value Ref Range Status   Campylobacter species NOT DETECTED NOT DETECTED Final   Plesimonas shigelloides NOT DETECTED NOT DETECTED Final   Salmonella species NOT DETECTED NOT DETECTED Final   Yersinia enterocolitica DETECTED (A) NOT DETECTED Final    Comment: RESULT CALLED TO, READ BACK BY AND VERIFIED WITH: VICTOR AGBAI @1732  ON 06/23/20 SKL    Vibrio species NOT DETECTED NOT DETECTED Final   Vibrio cholerae NOT DETECTED NOT DETECTED Final   Enteroaggregative E coli (EAEC) NOT DETECTED NOT DETECTED Final   Enteropathogenic E coli (EPEC) NOT DETECTED NOT DETECTED Final   Enterotoxigenic E coli (ETEC) NOT DETECTED NOT DETECTED Final   Shiga like toxin producing E coli (STEC) NOT DETECTED NOT DETECTED Final   Shigella/Enteroinvasive E coli (EIEC) NOT DETECTED NOT DETECTED Final   Cryptosporidium NOT DETECTED NOT DETECTED Final   Cyclospora cayetanensis NOT DETECTED NOT DETECTED Final   Entamoeba histolytica NOT DETECTED NOT DETECTED Final   Giardia lamblia NOT DETECTED NOT DETECTED Final   Adenovirus F40/41 NOT DETECTED NOT DETECTED Final   Astrovirus NOT DETECTED NOT DETECTED Final   Norovirus GI/GII NOT DETECTED NOT DETECTED Final   Rotavirus A NOT DETECTED NOT DETECTED Final   Sapovirus (I, II, IV, and V) NOT DETECTED NOT DETECTED Final    Comment: Performed at [facility], [place] [address] [place], [place] [address]  C Difficile Quick Screen w PCR reflex     Status: None   Collection Time: 06/23/20  9:38 AM   Specimen: STOOL  Result Value Ref Range Status   C Diff antigen NEGATIVE NEGATIVE Final   C Diff toxin NEGATIVE NEGATIVE Final   C Diff interpretation No C. difficile detected.  Final    Comment: Performed at [facility], [place] [address], [place], [place] [address]     Labs: BNP (last 3 results) No results for input(s): BNP in the last 8760 hours. Basic Metabolic Panel: Recent Labs  Lab 06/23/20 0305 06/23/20 1005 06/23/20 1930 06/23/20 2113 06/24/20 0202 06/24/20 1419 06/25/20 0221 06/25/20 1503 06/26/20 0537 06/27/20 0400  NA 132*   < > 132*  --  134*  --  133*  --  135 134*  K 2.8*   < > 2.6*  --  2.8* 2.5* 2.9* 3.9 3.7 3.8  CL 103   < > 102  --  103  --  104  --  108 108  CO2 14*   < > 15*  --  18*  --  19*  --  19* 20*  GLUCOSE 140*   < > 134*  --  92  --  112*  --  140* 165*  BUN 109*   < > 98*  --  95*  --  78*  --  68* 64*  CREATININE 5.37*   < > 4.45*  --  4.20*  --  3.25*  --  3.00* 2.87*  CALCIUM 8.8*   < > 8.4*  --  8.6*  --  8.5*  --  9.0 9.3  MG 2.6*  --   --  2.4 2.2  --   --   --  1.9  --   PHOS 5.9*  --   --   --  3.6  --   --   --   --   --    < > = values in this interval not  displayed.   Liver Function Tests: No results for input(s): AST, ALT, ALKPHOS, BILITOT, PROT, ALBUMIN in the last 168 hours. No results for input(s): LIPASE, AMYLASE in the last 168 hours. No results for input(s): AMMONIA in the last 168 hours. CBC: Recent Labs  Lab 06/23/20 0305 06/24/20 0202 06/25/20 0221  WBC 5.1 5.6 7.2  HGB 11.0* 10.3* 9.6*  HCT 32.4* 30.0* 27.3*  MCV 84.6 84.5 84.8  PLT 291 253 218   Cardiac Enzymes: No results for input(s): CKTOTAL, CKMB, CKMBINDEX, TROPONINI in the last 168 hours. BNP: Invalid input(s): POCBNP CBG: Recent Labs  Lab 06/26/20 0801 06/26/20 1211 06/26/20 1656 06/26/20 2124 06/27/20 0857  GLUCAP 135* 192* 175* 175* 146*   D-Dimer No results for input(s): DDIMER in the last 72 hours. Hgb A1c No results for input(s): HGBA1C in the last 72 hours. Lipid Profile No results for input(s): CHOL, HDL, LDLCALC, TRIG, CHOLHDL, LDLDIRECT in the last 72 hours. Thyroid function studies No results for input(s): TSH, T4TOTAL, T3FREE, THYROIDAB in  the last 72 hours.  Invalid input(s): FREET3 Anemia work up No results for input(s): VITAMINB12, FOLATE, FERRITIN, TIBC, IRON, RETICCTPCT in the last 72 hours. Urinalysis    Component Value Date/Time   COLORURINE YELLOW 06/23/2020 0246   APPEARANCEUR HAZY (A) 06/23/2020 0246   LABSPEC 1.012 06/23/2020 0246   PHURINE 5.0 06/23/2020 0246   GLUCOSEU NEGATIVE 06/23/2020 0246   HGBUR NEGATIVE 06/23/2020 0246   BILIRUBINUR NEGATIVE 06/23/2020 0246   KETONESUR NEGATIVE 06/23/2020 0246   PROTEINUR 30 (A) 06/23/2020 0246   UROBILINOGEN 0.2 12/02/2013 1739   NITRITE NEGATIVE 06/23/2020 0246   LEUKOCYTESUR MODERATE (A) 06/23/2020 0246   Sepsis Labs Invalid input(s): PROCALCITONIN,  WBC,  LACTICIDVEN Microbiology Recent Results (from the past 240 hour(s))  MRSA PCR Screening     Status: None   Collection Time: 06/23/20  3:08 AM   Specimen: Nasopharyngeal  Result Value Ref Range Status   MRSA by PCR NEGATIVE NEGATIVE Final    Comment:        The GeneXpert MRSA Assay (FDA approved for NASAL specimens only), is one component of a comprehensive MRSA colonization surveillance program. It is not intended to diagnose MRSA infection nor to guide or monitor treatment for MRSA infections. Performed at [facility], [place] [address], [place], [place] [address]   Gastrointestinal Panel by PCR , Stool     Status: Abnormal   Collection Time: 06/23/20  9:29 AM   Specimen: Stool  Result Value Ref Range Status   Campylobacter species NOT DETECTED NOT DETECTED Final   Plesimonas shigelloides NOT DETECTED NOT DETECTED Final   Salmonella species NOT DETECTED NOT DETECTED Final   Yersinia enterocolitica DETECTED (A) NOT DETECTED Final    Comment: RESULT CALLED TO, READ BACK BY AND VERIFIED WITH: VICTOR AGBAI @1732  ON 06/23/20 SKL    Vibrio species NOT DETECTED NOT DETECTED Final   Vibrio cholerae NOT DETECTED NOT DETECTED Final   Enteroaggregative E coli (EAEC) NOT DETECTED NOT DETECTED  Final   Enteropathogenic E coli (EPEC) NOT DETECTED NOT DETECTED Final   Enterotoxigenic E coli (ETEC) NOT DETECTED NOT DETECTED Final   Shiga like toxin producing E coli (STEC) NOT DETECTED NOT DETECTED Final   Shigella/Enteroinvasive E coli (EIEC) NOT DETECTED NOT DETECTED Final   Cryptosporidium NOT DETECTED NOT DETECTED Final   Cyclospora cayetanensis NOT DETECTED NOT DETECTED Final   Entamoeba histolytica NOT DETECTED NOT DETECTED Final   Giardia lamblia NOT DETECTED NOT DETECTED Final  Adenovirus F40/41 NOT DETECTED NOT DETECTED Final   Astrovirus NOT DETECTED NOT DETECTED Final   Norovirus GI/GII NOT DETECTED NOT DETECTED Final   Rotavirus A NOT DETECTED NOT DETECTED Final   Sapovirus (I, II, IV, and V) NOT DETECTED NOT DETECTED Final    Comment: Performed at [facility], [place], [place] [address]  C Difficile Quick Screen w PCR reflex     Status: None   Collection Time: 06/23/20  9:38 AM   Specimen: STOOL  Result Value Ref Range Status   C Diff antigen NEGATIVE NEGATIVE Final   C Diff toxin NEGATIVE NEGATIVE Final   C Diff interpretation No C. difficile detected.  Final    Comment: Performed at [facility], [place] [address] [place], [place] [address]    Please note: You were cared for by a hospitalist during your hospital stay. Once you are discharged, your primary care physician will handle any further medical issues. Please note that NO REFILLS for any discharge medications will be authorized once you are discharged, as it is imperative that you return to your primary care physician (or establish a relationship with a primary care physician if you do not have one) for your post hospital discharge needs so that they can reassess your need for medications and monitor your lab values.    Time coordinating discharge: 40 minutes  SIGNED:   Shelly Coss, MD  [facility] 06/27/2020, 10:55 AM Pager [phone]  If 7PM-7AM, please  contact night-coverage [email or web address] Password [email or web address]

## 2020-06-27 NOTE — Progress Notes (Signed)
RN sent COVID swab down earlier this afternoon, by 1500 test had not results, called lab and they stated they never received swab. Pt swabbed again and test sent down to lab.

## 2020-06-28 LAB — GLUCOSE, CAPILLARY
Glucose-Capillary: 138 mg/dL — ABNORMAL HIGH (ref 70–99)
Glucose-Capillary: 221 mg/dL — ABNORMAL HIGH (ref 70–99)

## 2020-06-28 NOTE — TOC Transition Note (Signed)
Transition of Care [facility]) - CM/SW Discharge Note   Patient Details  Name: Frank Hoover MRN: [id number] Date of Birth: October 28, 1950  Transition of Care [facility]) CM/SW Contact:  Milinda Antis, [place] Phone Number: 06/28/2020, 9:19 AM   Clinical Narrative:     Patient will DC to: [facility] Anticipated DC date: 4.27.2022 Family notified: Legal Guardian, Marinda Elk Transport by: Corey Harold   Per MD patient ready for DC to [place] . RN to call report prior to discharge [phone]. RN, patient, patient's family, and facility notified of DC. Discharge Summary and FL2 sent to facility. DC packet on chart. Ambulance transport requested for patient.   CSW will sign off for now as social work intervention is no longer needed. Please consult [place] again if new needs arise.    Final next level of care: Skilled Nursing Facility Barriers to Discharge: No Barriers Identified   Patient Goals and CMS Choice   CMS Medicare.gov Compare Post Acute Care list provided to:: Legal Guardian Choice offered to / list presented to : [place] / [place]  Discharge Placement   Existing PASRR number confirmed : 06/28/20          Patient chooses bed at: [facility] Patient to be transferred to facility by: [place] Name of family member notified: Ross,Tierra ([place])   [phone] Patient and family notified of of transfer: 06/28/20  Discharge Plan and Services     Post Acute Care Choice: [place]                               Social Determinants of Health (SDOH) Interventions     Readmission Risk Interventions Readmission Risk Prevention Plan 07/31/2018 06/29/2018  Transportation Screening Complete Complete  PCP or Specialist Appt within 3-5 Days Complete Complete  HRI or Home Care Consult Complete Complete  Social Work Consult for [place] Planning/Counseling - Complete  Palliative Care Screening Not Complete Not Applicable  Medication  Review Press photographer) Complete Complete  Some recent data might be hidden

## 2020-06-30 LAB — STOOL CULTURE: E coli, Shiga toxin Assay: NEGATIVE

## 2020-06-30 LAB — STOOL CULTURE REFLEX - RSASHR

## 2020-06-30 LAB — STOOL CULTURE REFLEX - CMPCXR

## 2020-08-17 ENCOUNTER — Ambulatory Visit: Payer: Medicare Other | Admitting: Urology

## 2020-08-24 ENCOUNTER — Encounter: Payer: Self-pay | Admitting: Cardiology

## 2020-08-24 ENCOUNTER — Other Ambulatory Visit: Payer: Self-pay

## 2020-08-24 ENCOUNTER — Ambulatory Visit (INDEPENDENT_AMBULATORY_CARE_PROVIDER_SITE_OTHER): Payer: Medicare Other | Admitting: Cardiology

## 2020-08-24 VITALS — BP 90/60 | HR 96 | Ht 66.0 in

## 2020-08-24 DIAGNOSIS — I5022 Chronic systolic (congestive) heart failure: Secondary | ICD-10-CM

## 2020-08-24 DIAGNOSIS — I4891 Unspecified atrial fibrillation: Secondary | ICD-10-CM

## 2020-08-24 NOTE — Progress Notes (Signed)
Clinical Summary Mr. Talerico is a 70 y.o.male  1. Chronic systolic HF - echo 03/624 LVEF 25%, diffuse hypokinesis. Mod RV dysfunction. - new diagnosis 08/2015 during admission with sepsis - thought possible tachcyardia mediated, however he has not had an ischemic evaluation - echo Jan 2018 LVEF 30-35%.    - ICD followed by EP  .medical therapy limited due to CKD and previous low bp's      06/2020 LVEF 60-65%   - no recent SOB/DOE, no LE edema - compliant with meds - - no lightheadedness or dizziness.       2. CKD IV - followed by Dr Theador Hawthorne - last Cr is 4.01   3. Urinary retention - issues with urinary retention during 06/2018 admission, discharged with foley catheter.   4. 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  - recent EP notes metion rare afib on device check       Past Medical History:  Diagnosis Date   Adenomatous colon polyp 10/30/09   Serrated adenoma removed during Colonoscopy    AICD (automatic cardioverter/defibrillator) present 06/11/2016   Arthritis    "legs" (09/27/2015)   Atrial flutter with rapid ventricular response ([place]) 09/27/2015   CHF (congestive heart failure) (HCC)    CKD (chronic kidney disease) stage 3, GFR 30-59 ml/min (HCC)    COPD (chronic obstructive pulmonary disease) (HCC)    Diverticulosis of colon    Dysrhythmia    Essential hypertension    Gastroparesis    GERD (gastroesophageal reflux disease)    Gout    Hyperlipemia    Hypertension    OA (osteoarthritis)    Type 2 diabetes mellitus (HCC)      Allergies  Allergen Reactions   Peanut Butter Flavor Other (See Comments)    Unknown reaction to peanut butter     Current Outpatient Medications  Medication Sig Dispense Refill   acetaminophen (TYLENOL) 325 MG tablet Take 2 tablets (650 mg total) by mouth  every 6 (six) hours as needed for mild pain (or Fever >/= 101). (Patient taking differently: Take 650 mg by mouth every 4 (four) hours as needed (pain/ temp 101 or greater).) 30 tablet 5   allopurinol (ZYLOPRIM) 100 MG tablet Take 2 tablets (200 mg total) by mouth daily.     aspirin 81 MG chewable tablet Chew 81 mg by mouth every morning.      atorvastatin (LIPITOR) 20 MG tablet Take 20 mg by mouth at bedtime.     cholecalciferol (VITAMIN D) 1000 units tablet Take 1,000 Units by mouth every morning.     ferrous sulfate 325 (65 FE) MG tablet Take 325 mg by mouth 2 (two) times daily.     gabapentin (NEURONTIN) 100 MG capsule Take 200 mg by mouth daily at 2 PM.     gabapentin (NEURONTIN) 400 MG capsule Take 400 mg by mouth in the morning and at bedtime.     hydrALAZINE (APRESOLINE) 25 MG tablet Take 25 mg by mouth 3 (three) times daily.     isosorbide mononitrate (IMDUR) 30 MG 24 hr tablet TAKE 1 TABLET DAILY (Patient taking differently: Take 30 mg by mouth every morning.) 90 tablet 1   levothyroxine (SYNTHROID) 50 MCG tablet Take 50 mcg by mouth daily at 6 (six) AM.     linaclotide (LINZESS) 72 MCG capsule Take 72 mcg by mouth  every morning.     loratadine (CLARITIN) 10 MG tablet Take 10 mg by mouth every morning.     magnesium hydroxide (MILK OF MAGNESIA) 400 MG/5ML suspension Take 30 mLs by mouth daily as needed (bowel management).     metoprolol succinate (TOPROL-XL) 25 MG 24 hr tablet Take 1 tablet (25 mg total) by mouth daily.     Multiple Vitamin (MULTIVITAMIN WITH MINERALS) TABS tablet Take 1 tablet by mouth daily.     Nutritional Supplements (NUTRITIONAL SUPPLEMENT PO) Take 90 mLs by mouth 3 (three) times daily. House 2.0     pantoprazole (PROTONIX) 20 MG tablet Take 40 mg by mouth every morning.     potassium chloride SA (KLOR-CON) 20 MEQ tablet Take 1 tablet (20 mEq total) by mouth every morning. Take 2 tabs (40 meq) daily for 7 days from tomorrow then continue taking 20 mEq daily      sertraline (ZOLOFT) 100 MG tablet Take 100 mg by mouth every morning.     sodium bicarbonate 650 MG tablet Take 1 tablet (650 mg total) by mouth 3 (three) times daily.     vitamin C (ASCORBIC ACID) 500 MG tablet Take 500 mg by mouth 2 (two) times daily.      No current facility-administered medications for this visit.     Past Surgical History:  Procedure Laterality Date   CARDIOVERSION N/A 08/22/2015   Procedure: CARDIOVERSION;  Surgeon: Larey Dresser, MD;  Location: [place];  Service: Cardiovascular;  Laterality: N/A;   CARDIOVERSION N/A 09/01/2015   Procedure: CARDIOVERSION;  Surgeon: Lelon Perla, MD;  Location: [facility] ENDOSCOPY;  Service: Cardiovascular;  Laterality: N/A;   CATARACT EXTRACTION W/ INTRAOCULAR LENS  IMPLANT, BILATERAL Bilateral    COLONOSCOPY  10/30/09   GUY:[id number] diverticulum/serrated adenoma from ICV, next colonoscopy due 10/2012   COLONOSCOPY N/A 09/18/2012   DGL:OVFIEPP mucosa that was seen appeared normal, however most of it was not seen due to be very poor prep   COLONOSCOPY N/A 04/08/2013   IRJ:JOACZYS polyp removed/inadequate preparation   COLONOSCOPY N/A 06/22/2014   Procedure: COLONOSCOPY;  Surgeon: Daneil Dolin, MD;  [facility];  Service: Endoscopy;  Laterality: N/A;  115    ELECTROPHYSIOLOGIC STUDY N/A 09/27/2015   Procedure: SVT Ablation;  Surgeon: Evans Lance, MD;  Location: [place] CV LAB;  Service: Cardiovascular;  Laterality: N/A;   EP IMPLANTABLE DEVICE  06/11/2016   ESOPHAGOGASTRODUODENOSCOPY  10/30/09   AYT:[id number]    FLEXIBLE SIGMOIDOSCOPY N/A 08/25/2015   Procedure: FLEXIBLE SIGMOIDOSCOPY;  Surgeon: Jerene Bears, MD;  Location: [place] ENDOSCOPY;  Service: Endoscopy;  Laterality: N/A;   ICD IMPLANT N/A 06/11/2016   Procedure: ICD Implant;  Surgeon: Evans Lance, MD;  Location: [place] CV LAB;  Service: Cardiovascular;  Laterality: N/A;   TEE WITHOUT CARDIOVERSION N/A 08/22/2015   Procedure: TRANSESOPHAGEAL ECHOCARDIOGRAM  (TEE);  Surgeon: Larey Dresser, MD;  Location: [place];  Service: Cardiovascular;  Laterality: N/A;   TEE WITHOUT CARDIOVERSION  09/27/2015   Procedure: Transesophageal Echocardiogram (Tee);  Surgeon: Evans Lance, MD;  Location: [place] CV LAB;  Service: Cardiovascular;;   TONSILLECTOMY  1950s/1960s     Allergies  Allergen Reactions   Peanut Butter Flavor Other (See Comments)    Unknown reaction to peanut butter      Family History  Problem Relation Age of Onset   COPD Father    Diabetes Mother    Hypertension Mother    Colon cancer Neg Hx  Social History Mr. Smoak reports that he quit smoking about 10 years ago. His smoking use included cigarettes. He has a 4.00 pack-year smoking history. He has never used smokeless tobacco. Mr. Dethloff reports no history of alcohol use.   Review of Systems CONSTITUTIONAL: No weight loss, fever, chills, weakness or fatigue.  HEENT: Eyes: No visual loss, blurred vision, double vision or yellow sclerae.No hearing loss, sneezing, congestion, runny nose or sore throat.  SKIN: No rash or itching.  CARDIOVASCULAR: per hpi RESPIRATORY: No shortness of breath, cough or sputum.  GASTROINTESTINAL: No anorexia, nausea, vomiting or diarrhea. No abdominal pain or blood.  GENITOURINARY: No burning on urination, no polyuria NEUROLOGICAL: No headache, dizziness, syncope, paralysis, ataxia, numbness or tingling in the extremities. No change in bowel or bladder control.  MUSCULOSKELETAL: No muscle, back pain, joint pain or stiffness.  LYMPHATICS: No enlarged nodes. No history of splenectomy.  PSYCHIATRIC: No history of depression or anxiety.  ENDOCRINOLOGIC: No reports of sweating, cold or heat intolerance. No polyuria or polydipsia.  Marland Kitchen   Physical Examination Today's Vitals   08/24/20 1057  BP: 90/60  Pulse: 96  SpO2: 97%  Height: 5' 6"  (1.676 m)   Body mass index is 28.93 kg/m.  Gen: resting comfortably, no acute  distress HEENT: no scleral icterus, pupils equal round and reactive, no palptable cervical adenopathy,  CV Resp: Clear to auscultation bilaterally GI: abdomen is soft, non-tender, non-distended, normal bowel sounds, no hepatosplenomegaly MSK: extremities are warm, no edema.  Skin: warm, no rash Psych: appropriate affect   Diagnostic Studies  Jan 2018 echo Study Conclusions   - Left ventricle: The cavity size was normal. Systolic function was   moderately to severely reduced. The estimated ejection fraction   was in the range of 30% to 35%. Diffuse hypokinesis. Doppler   parameters are consistent with abnormal left ventricular   relaxation (grade 1 diastolic dysfunction). Mild to moderate LVH. - Mitral valve: There was mild regurgitation. - Left atrium: The atrium was mildly dilated. - Right ventricle: Systolic function was mildly to moderately   reduced. - Atrial septum: No defect or patent foramen ovale was identified.   Assessment and Plan  1. Chronic systolic HF - medical therapy limited by CKD and prior low bp's, though more recently bp's have been elevated -marked improvement in fluid status and renal function after diagnosis of urinary obstruction now with chronic foley catheter - most recent echo actually shows LVEF has normalized - continue current meds    2. Afib/aflutter - priro GI bleed on anticoag - no recent symptoms       Arnoldo Lenis, M.D.

## 2020-08-24 NOTE — Patient Instructions (Signed)

## 2020-09-11 ENCOUNTER — Ambulatory Visit: Payer: Medicare Other

## 2020-09-12 LAB — CUP PACEART REMOTE DEVICE CHECK
Battery Remaining Longevity: 132 mo
Battery Remaining Percentage: 100 %
Brady Statistic RA Percent Paced: 2 %
Brady Statistic RV Percent Paced: 2 %
Date Time Interrogation Session: 20220711030200
HighPow Impedance: 58 Ohm
Implantable Lead Implant Date: 20180410
Implantable Lead Implant Date: 20180410
Implantable Lead Location: 753859
Implantable Lead Location: 753860
Implantable Lead Model: 292
Implantable Lead Model: 7740
Implantable Lead Serial Number: 420134
Implantable Lead Serial Number: 693412
Implantable Pulse Generator Implant Date: 20180410
Lead Channel Impedance Value: 393 Ohm
Lead Channel Impedance Value: 640 Ohm
Lead Channel Setting Pacing Amplitude: 2 V
Lead Channel Setting Pacing Amplitude: 2.4 V
Lead Channel Setting Pacing Pulse Width: 0.4 ms
Lead Channel Setting Sensing Sensitivity: 0.5 mV
Pulse Gen Serial Number: 533916

## 2020-09-27 ENCOUNTER — Encounter: Payer: Medicare Other | Admitting: Vascular Surgery

## 2020-09-27 ENCOUNTER — Other Ambulatory Visit: Payer: Medicare Other

## 2020-10-02 DEATH — deceased

## 2021-02-28 ENCOUNTER — Ambulatory Visit: Payer: Medicare Other | Admitting: Cardiology

## 2021-03-15 ENCOUNTER — Ambulatory Visit: Payer: Medicare Other | Admitting: Urology
